# Patient Record
Sex: Male | Born: 1935 | Race: White | Hispanic: No | Marital: Married | Smoking: Former smoker
Health system: Southern US, Community
[De-identification: ages and names within clinical notes are randomized; demographics above are authoritative.]

## PROBLEM LIST (undated history)

## (undated) DIAGNOSIS — R251 Tremor, unspecified: Secondary | ICD-10-CM

## (undated) DIAGNOSIS — N400 Enlarged prostate without lower urinary tract symptoms: Secondary | ICD-10-CM

## (undated) DIAGNOSIS — K219 Gastro-esophageal reflux disease without esophagitis: Secondary | ICD-10-CM

## (undated) DIAGNOSIS — D499 Neoplasm of unspecified behavior of unspecified site: Secondary | ICD-10-CM

## (undated) DIAGNOSIS — Z95820 Peripheral vascular angioplasty status with implants and grafts: Secondary | ICD-10-CM

## (undated) DIAGNOSIS — J383 Other diseases of vocal cords: Secondary | ICD-10-CM

## (undated) DIAGNOSIS — R0989 Other specified symptoms and signs involving the circulatory and respiratory systems: Secondary | ICD-10-CM

## (undated) DIAGNOSIS — Z8679 Personal history of other diseases of the circulatory system: Secondary | ICD-10-CM

## (undated) DIAGNOSIS — I70219 Atherosclerosis of native arteries of extremities with intermittent claudication, unspecified extremity: Secondary | ICD-10-CM

## (undated) DIAGNOSIS — I7090 Unspecified atherosclerosis: Secondary | ICD-10-CM

## (undated) DIAGNOSIS — E785 Hyperlipidemia, unspecified: Secondary | ICD-10-CM

## (undated) DIAGNOSIS — E039 Hypothyroidism, unspecified: Secondary | ICD-10-CM

## (undated) DIAGNOSIS — IMO0002 Reserved for concepts with insufficient information to code with codable children: Secondary | ICD-10-CM

## (undated) DIAGNOSIS — J449 Chronic obstructive pulmonary disease, unspecified: Secondary | ICD-10-CM

## (undated) DIAGNOSIS — G4730 Sleep apnea, unspecified: Secondary | ICD-10-CM

## (undated) DIAGNOSIS — C801 Malignant (primary) neoplasm, unspecified: Secondary | ICD-10-CM

## (undated) DIAGNOSIS — E079 Disorder of thyroid, unspecified: Secondary | ICD-10-CM

## (undated) DIAGNOSIS — M5126 Other intervertebral disc displacement, lumbar region: Secondary | ICD-10-CM

## (undated) DIAGNOSIS — IMO0001 Reserved for inherently not codable concepts without codable children: Secondary | ICD-10-CM

## (undated) DIAGNOSIS — N189 Chronic kidney disease, unspecified: Secondary | ICD-10-CM

## (undated) DIAGNOSIS — M47817 Spondylosis without myelopathy or radiculopathy, lumbosacral region: Secondary | ICD-10-CM

## (undated) DIAGNOSIS — R490 Dysphonia: Secondary | ICD-10-CM

## (undated) DIAGNOSIS — M5136 Other intervertebral disc degeneration, lumbar region: Secondary | ICD-10-CM

## (undated) DIAGNOSIS — M51369 Other intervertebral disc degeneration, lumbar region without mention of lumbar back pain or lower extremity pain: Secondary | ICD-10-CM

## (undated) DIAGNOSIS — M1990 Unspecified osteoarthritis, unspecified site: Secondary | ICD-10-CM

## (undated) HISTORY — DX: Unspecified osteoarthritis, unspecified site: M19.90

## (undated) HISTORY — PX: THYROID SURGERY: SHX805

## (undated) HISTORY — PX: TONSILLECTOMY: SUR1361

## (undated) HISTORY — PX: MOUTH SURGERY: SHX715

## (undated) HISTORY — DX: Disorder of thyroid, unspecified: E07.9

## (undated) HISTORY — PX: APPENDECTOMY: SHX54

## (undated) HISTORY — PX: COLONOSCOPY W/ POLYPECTOMY: SHX1380

---

## 2014-03-17 DIAGNOSIS — I739 Peripheral vascular disease, unspecified: Secondary | ICD-10-CM | POA: Insufficient documentation

## 2014-03-17 DIAGNOSIS — E7800 Pure hypercholesterolemia, unspecified: Secondary | ICD-10-CM | POA: Insufficient documentation

## 2014-03-17 DIAGNOSIS — E785 Hyperlipidemia, unspecified: Secondary | ICD-10-CM | POA: Insufficient documentation

## 2014-03-17 DIAGNOSIS — E890 Postprocedural hypothyroidism: Secondary | ICD-10-CM | POA: Insufficient documentation

## 2014-03-17 DIAGNOSIS — N182 Chronic kidney disease, stage 2 (mild): Secondary | ICD-10-CM | POA: Insufficient documentation

## 2014-03-17 DIAGNOSIS — M5136 Other intervertebral disc degeneration, lumbar region: Secondary | ICD-10-CM | POA: Insufficient documentation

## 2014-03-17 DIAGNOSIS — R490 Dysphonia: Secondary | ICD-10-CM | POA: Insufficient documentation

## 2014-03-17 DIAGNOSIS — R251 Tremor, unspecified: Secondary | ICD-10-CM | POA: Insufficient documentation

## 2014-03-17 DIAGNOSIS — K219 Gastro-esophageal reflux disease without esophagitis: Secondary | ICD-10-CM | POA: Insufficient documentation

## 2014-03-23 DIAGNOSIS — N401 Enlarged prostate with lower urinary tract symptoms: Secondary | ICD-10-CM

## 2014-03-23 DIAGNOSIS — N138 Other obstructive and reflux uropathy: Secondary | ICD-10-CM | POA: Insufficient documentation

## 2014-03-26 DIAGNOSIS — Z85850 Personal history of malignant neoplasm of thyroid: Secondary | ICD-10-CM | POA: Insufficient documentation

## 2014-05-19 ENCOUNTER — Ambulatory Visit (INDEPENDENT_AMBULATORY_CARE_PROVIDER_SITE_OTHER): Payer: BC Managed Care – PPO

## 2014-05-19 ENCOUNTER — Encounter: Payer: Self-pay | Admitting: Podiatrist

## 2014-05-19 ENCOUNTER — Ambulatory Visit (INDEPENDENT_AMBULATORY_CARE_PROVIDER_SITE_OTHER): Payer: BC Managed Care – PPO | Admitting: Podiatrist

## 2014-05-19 DIAGNOSIS — R52 Pain, unspecified: Secondary | ICD-10-CM

## 2014-05-19 DIAGNOSIS — M722 Plantar fascial fibromatosis: Secondary | ICD-10-CM

## 2014-05-19 NOTE — Patient Instructions (Signed)

## 2014-05-19 NOTE — Progress Notes (Signed)
   Subjective:    Patient ID: Victor [name], male    DOB: 1936/06/09, 78 y.o.   MRN: [id number]  HPI PT STATED RT FOOT HEEL IS BEEN HURTING FOR 3 MONTHS. THE FOOT IS GETTING WORSE. THE FOOT GET AGGRAVATED FIRST THING IN THE MORNING. TRIED ICE, STETCHING AND IT HELP SOME.    Review of Systems  HENT:       RINGING EARS  Eyes: Positive for visual disturbance.  Musculoskeletal: Positive for back pain and joint swelling.  Neurological: Positive for tremors and light-headedness.  Hematological: Bruises/bleeds easily.  All other systems reviewed and are negative.      Objective:   Physical Exam Patient is awake, alert, and oriented x 3.  In no acute distress.  Vascular status is intact with palpable pedal pulses at 2/4 DP and PT bilateral and capillary refill time within normal limits. Neurological sensation is also intact bilaterally via Semmes Weinstein monofilament at 5/5 sites. Light touch, vibratory sensation, Achilles tendon reflex is intact. Dermatological exam reveals  A verucca present on the plantar aspect of the right foot.  Left hallux has a mycotic toenail present.  otherwise skin color, turger and texture as normal. No open lesions present.  Musculature intact with dorsiflexion, plantarflexion, inversion, eversion. Pain on palpation present on the right foot at the insertion of the plantar fascia on the plantar medial aspect of the foot.      Assessment & Plan:  Plantar fasciitis right, plantar verucca, mycotic nail  Recommended treatment options.  Discussed oral, topical, laser treatment options for the nail.  Discussed plantar fasciiitis and treatment options.  Stretches, plantar fascial stretches, shoe changes and inserts were recommended.  Plantar fascial injection was also performed under sterile technique.

## 2014-11-12 DIAGNOSIS — C801 Malignant (primary) neoplasm, unspecified: Secondary | ICD-10-CM

## 2014-11-12 HISTORY — DX: Malignant (primary) neoplasm, unspecified: C80.1

## 2014-11-12 HISTORY — PX: OTHER SURGICAL HISTORY: SHX169

## 2014-12-22 ENCOUNTER — Ambulatory Visit: Payer: Self-pay | Admitting: Podiatrist

## 2015-02-24 ENCOUNTER — Telehealth: Payer: Self-pay | Admitting: Radiation Oncology

## 2015-02-24 NOTE — Telephone Encounter (Signed)
Mr. Serrette wanted to know how to find radiation oncology. 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

## 2015-02-25 ENCOUNTER — Telehealth: Payer: Self-pay | Admitting: *Deleted

## 2015-02-25 NOTE — Telephone Encounter (Signed)
Placed introductory call to new referral patient. 1. Introduced myself as the oncology nurse navigator that works with Dr. Isidore Moos to whom he has been referred by Dr. Redmond Baseman and with whom he has an appt next Wed. 2. I briefly explained my role as his navigator, I indicated that I would be joining him during his appt next week. 3. I answered his questions re: RT, including likely SEs. 4. I confirmed his understanding of the [facility] location, explained Pulcifer service, arrival and RadOnc registration process.  5. I provided my contact information, encouraged him to call if he has questions/concerns before next week. 6. He verbalized understanding of information provided, expressed appreciation for my call.  Gayleen Orem, RN, BSN, [place] at [place] [phone]

## 2015-03-01 ENCOUNTER — Encounter: Payer: Self-pay | Admitting: Radiation Oncology

## 2015-03-01 NOTE — Progress Notes (Addendum)
Head and Neck Cancer Location of Tumor / Histology: Vocal Cord   Patient presented  months ago with symptoms of: Hoarseness, dysphonia  ,dysplasia Biopsies of  (if applicable) revealed: 0000000: Vocal Cord ,biopsy,left anterior=Squamous cell Carcinoma in Situ, No stromal invasion identifie  Nutrition Status Yes No Comments  Weight changes? []  [x]    Swallowing concerns? []  [x]    PEG? []  [x]     Referrals Yes No Comments  Social Work? []  [x]    Dentistry? [x]  []  03/01/15 Dr. Laurelyn Sickle   Swallowing therapy? []  []    Nutrition? []  [x]    Med/Onc? []  [x]     Safety Issues Yes No Comments  Prior radiation? []  [x]    Pacemaker/ICD? []  [x]    Possible current pregnancy? []  [x]    Is the patient on methotrexate? []  [x]     Tobacco/Marijuana/Snuff/ETOH [id number] smoker cigarettes, social alcohol use,  Past/Anticipated interventions by otolaryngology, if any: Dr. David Bates,MD4/14/16    Past/Anticipated interventions by medical oncology, if any: none     Current Complaints / other details: Married,  2 son and daughter,  Chronic kidney disease stage IIII,  Likely 2ndary to NSAID use  Followed by nephrology ,occasional tremors , Hx thyroid malignancy, stents b/l groin last year legs were going numb,   Father cancer colon, deceased, sister started with breast cancer, mets to bone,lung, deceased,  Allergies:NKDA No nausea, no swallowing difficulty, no pain, no fatigue Wt Readings from Last 3 Encounters:  No data found for Wt   BP 128/65 mmHg  Pulse 58  Temp(Src) 97.6 F (36.4 C) (Oral)  Resp 20  Ht 5\' 10"  (1.778 m)  Wt 212 lb 4.8 oz (96.299 kg)  BMI 30.46 kg/m2  SpO2 97%

## 2015-03-02 ENCOUNTER — Ambulatory Visit
Admission: RE | Admit: 2015-03-02 | Discharge: 2015-03-02 | Disposition: A | Discharge status: Home / Self Care | Payer: Medicare Other | Source: Ambulatory Visit | Attending: Radiation Oncology | Admitting: Radiation Oncology

## 2015-03-02 ENCOUNTER — Encounter: Payer: Self-pay | Admitting: *Deleted

## 2015-03-02 ENCOUNTER — Encounter: Payer: Self-pay | Admitting: Radiation Oncology

## 2015-03-02 DIAGNOSIS — L599 Disorder of the skin and subcutaneous tissue related to radiation, unspecified: Secondary | ICD-10-CM | POA: Insufficient documentation

## 2015-03-02 DIAGNOSIS — D020 Carcinoma in situ of larynx: Secondary | ICD-10-CM

## 2015-03-02 DIAGNOSIS — Z931 Gastrostomy status: Secondary | ICD-10-CM | POA: Diagnosis not present

## 2015-03-02 DIAGNOSIS — Z510 Encounter for antineoplastic radiation therapy: Secondary | ICD-10-CM | POA: Insufficient documentation

## 2015-03-02 DIAGNOSIS — R1310 Dysphagia, unspecified: Secondary | ICD-10-CM | POA: Diagnosis not present

## 2015-03-02 HISTORY — DX: Other intervertebral disc displacement, lumbar region: M51.26

## 2015-03-02 HISTORY — DX: Hyperlipidemia, unspecified: E78.5

## 2015-03-02 HISTORY — DX: Tremor, unspecified: R25.1

## 2015-03-02 HISTORY — DX: Dysphonia: R49.0

## 2015-03-02 HISTORY — DX: Unspecified atherosclerosis: I70.90

## 2015-03-02 HISTORY — DX: Chronic kidney disease, unspecified: N18.9

## 2015-03-02 HISTORY — DX: Atherosclerosis of native arteries of extremities with intermittent claudication, unspecified extremity: I70.219

## 2015-03-02 HISTORY — DX: Malignant (primary) neoplasm, unspecified: C80.1

## 2015-03-02 HISTORY — DX: Gastro-esophageal reflux disease without esophagitis: K21.9

## 2015-03-02 HISTORY — DX: Other intervertebral disc degeneration, lumbar region without mention of lumbar back pain or lower extremity pain: M51.369

## 2015-03-02 HISTORY — DX: Other intervertebral disc degeneration, lumbar region: M51.36

## 2015-03-02 HISTORY — DX: Benign prostatic hyperplasia without lower urinary tract symptoms: N40.0

## 2015-03-02 HISTORY — DX: Other specified symptoms and signs involving the circulatory and respiratory systems: R09.89

## 2015-03-02 HISTORY — DX: Personal history of other diseases of the circulatory system: Z86.79

## 2015-03-02 HISTORY — DX: Spondylosis without myelopathy or radiculopathy, lumbosacral region: M47.817

## 2015-03-02 HISTORY — DX: Other diseases of vocal cords: J38.3

## 2015-03-02 HISTORY — DX: Peripheral vascular angioplasty status with implants and grafts: Z95.820

## 2015-03-02 NOTE — Progress Notes (Signed)
Please see the Nurse Progress Note in the MD Initial Consult Encounter for this patient. 

## 2015-03-02 NOTE — Progress Notes (Addendum)
Radiation Oncology         [phone] ________________________________  Initial outpatient Consultation  Name: Victor [name] MRN: [id number]  Date: 03/02/2015  DOB: 1936-05-22  CC:No primary care provider on file.  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 male who presented with several months h/o dysphonia.  Per Dr Redmond Baseman' notes, original biopsy of anterior commissure lesion in Nov 2015 revealed high grade dysplasia.  Tissue from CO2 laser excision of the left anterior vocal cord on 11/12/2014 showed squamous cell carcinoma in situ, no stromal invasion identified.  Patient has been discussed w/ Dr Redmond Baseman.  Further Surgical procedure not recommended due to concerns re: ability to clear all disease and also potential morbidity to patient's voice.  Recent laryngoscopy this month was done by Dr Redmond Baseman. Patient shared photos from January's laryngoscopy / procedure.            The pt feels well and has no complaints (besides dysphonia) at this time. Hx of smoking. 1.5 packs a day for 50 years. Quit 10 yrs ago.   PREVIOUS RADIATION THERAPY: No  PAST MEDICAL HISTORY:  has a past medical history of Thyroid disease; Arthritis; Cancer (11/12/14); BPH (benign prostatic hyperplasia); GERD (gastroesophageal reflux disease); Atherosclerosis; H/O carotid atherosclerosis; Carotid bruit; Bulging lumbar disc; Chronic kidney disease; Dysphonia; Hyperlipidemia; Spondylosis of lumbosacral region; Occasional tremors; DDD (degenerative disc disease), lumbar; Atherosclerotic PVD with intermittent claudication; Peripheral vascular angioplasty status with implants and grafts; and Dysplasia of true vocal cord.  Thyroidectomy.  PAST SURGICAL HISTORY: Past Surgical History    Procedure Laterality Date  . Vocal cord biopsy  11/12/14    squamous cell carcinoma in situ  . Appendectomy    . Tonsillectomy    . Mouth surgery      tooth extraction  . Thyroid surgery      FAMILY HISTORY: family history is not on file. Father cancer colon, deceased. Sister started with breast cancer, metastasized to bone and lungs, deceased.  SOCIAL HISTORY:  reports that he has never smoked. He does not have any smokeless tobacco history on file. He reports that he does not drink alcohol or use illicit drugs. Hx of smoking. 1.5 packs a day for 50 years. Quit 10 yrs ago.  ALLERGIES: Review of patient's allergies indicates no known allergies.  MEDICATIONS:  Current Outpatient Prescriptions  Medication Sig Dispense Refill  . aspirin 81 MG tablet Take 81 mg by mouth daily.    . diclofenac sodium (VOLTAREN) 1 % GEL Apply 2 g topically as needed.     . Doxazosin Mesylate (CARDURA PO) Take 2 mg by mouth daily.     Marland Kitchen FA-B6-B12-D-Omega 3-Phytoster (ANIMI-3/VITAMIN D PO) Take 1 tablet by mouth daily. d3    . levothyroxine (SYNTHROID, LEVOTHROID) 150 MCG tablet Take 150 mcg by mouth daily before breakfast.    . Liniments (SALONPAS EX) Apply topically as needed. patches    . Loratadine (CLARITIN PO) Take 10 mg by mouth as needed.     . Melatonin 3 MG TABS Take 3 mg by mouth at bedtime as needed.     Marland Kitchen omeprazole (PRILOSEC) 20 MG capsule Take 20 mg by mouth daily.    . propranolol (INDERAL) 80 MG tablet Take 80  mg by mouth 3 (three) times daily.    . simvastatin (ZOCOR) 40 MG tablet Take 40 mg by mouth daily.    . traMADol (ULTRAM) 50 MG tablet Take by mouth every 6 (six) hours as needed.    . clopidogrel (PLAVIX) 75 MG tablet Take 75 mg by mouth daily.    . Coenzyme Q10 (COQ10 PO) Take by mouth.    Marland Kitchen HYDROcodone-acetaminophen (NORCO/VICODIN) 5-325 MG per tablet Take 1 tablet by mouth every 6 (six) hours as needed for moderate pain.     No current facility-administered medications for this  encounter.    REVIEW OF SYSTEMS:  Notable for that above.   PHYSICAL EXAM:  height is 5\' 10"  (1.778 m) and weight is 212 lb 4.8 oz (96.299 kg). His oral temperature is 97.6 F (36.4 C). His blood pressure is 128/65 and his pulse is 58. His respiration is 20 and oxygen saturation is 97%.   General: Alert and oriented, in no acute distress HEENT: Head is normocephalic. Extraocular movements are intact. Oropharynx is clear. No palpable cervical or supraclavicular lymphadenopathy. Neck: Neck is supple, no palpable cervical or supraclavicular lymphadenopathy. Heart: Regular in rate and rhythm with no murmurs, rubs, or gallops. Chest: Clear to auscultation bilaterally, with no rhonchi, wheezes, or rales. Lungs are clear. Abdomen: Soft, nontender, nondistended, with no rigidity or guarding. Normal active bowel sounds. Extremities: No cyanosis or edema. Lymphatics: see Neck Exam Skin: No concerning lesions. Musculoskeletal: symmetric strength and muscle tone throughout. Neurologic: Cranial nerves II through XII are grossly intact. No obvious focalities. Speech is fluent. Coordination is intact. Psychiatric: Judgment and insight are intact. Affect is appropriate.  .  ECOG = 0  0 - Asymptomatic (Fully active, able to carry on all predisease activities without restriction)  1 - Symptomatic but completely ambulatory (Restricted in physically strenuous activity but ambulatory and able to carry out work of a light or sedentary nature. For example, light housework, office work)  2 - Symptomatic, <50% in bed during the day (Ambulatory and capable of all self care but unable to carry out any work activities. Up and about more than 50% of waking hours)  3 - Symptomatic, >50% in bed, but not bedbound (Capable of only limited self-care, confined to bed or chair 50% or more of waking hours)  4 - Bedbound (Completely disabled. Cannot carry on any self-care. Totally confined to bed or chair)  5 -  Death   [name], Creech RH, Tormey DC, et al. [phone]). "Toxicity and response criteria of the [facility]". [place] Oncol. 5 (6): 649-55   LABORATORY DATA:  No results found for: WBC, HGB, HCT, MCV, PLT CMP  No results found for: NA, K, CL, CO2, GLUCOSE, BUN, CREATININE, CALCIUM, PROT, ALBUMIN, AST, ALT, ALKPHOS, BILITOT, GFRNONAA, GFRAA       RADIOGRAPHY: No results found.    IMPRESSION/PLAN: The pt is a good candidate for radiotherapy. This may provide better voice preservation than surgical /laser procedure.  Discussed the benefits and side effects of radiotherapy with with pt. Had pictures of the pt's larynx (provided by the pt) entered into the electronic medical record (Epic). Schedule the pt to meet with a nutritionist to educate him with the importance of nutrition with recovery. Scheduled a CT sim and radiation planning on 03/08/15 at [place] to start radiation within that week to fit the pt's schedule. Consent signed today.  Discussed risks benefits and side effects of therapy. He is enthusiastic to  proceed.  Baseline Hypothyroidism to continue to be managed by PCP.   This document serves as a record of services personally performed by Eppie Gibson, MD. It was created on her behalf by Darcus Austin, a trained medical scribe. The creation of this record is based on the scribe's personal observations and the provider's statements to them. This document has been checked and approved by the attending provider.     __________________________________________   Eppie Gibson, MD

## 2015-03-04 NOTE — Progress Notes (Signed)
Met with patient during initial consult with Dr. Isidore Moos.  His was accompanied by his wife. 1. I further introduced myself as their Navigator, explained my role as a member of the Care Team, provided contact information, encouraged them to contact me with questions/concerns as treatments/procedures begin. 2. Provided New Patient Information packet:  Contact information for physician and navigator  Advance Directive information ([place] blue pamphlet)  Fall Prevention Patient Safety Plan  Appointment Guideline  [facility] campus map with highlight of [place] 3. Provided introductory explanation of radiation treatment including SIM planning and fitting of head mask, showed them example of mask.   4. Took pictures of January's laryngoscopy for later Epic documentation. 5. Provided a tour of SIM and Tomo areas, explained treatment and arrival procedures.   They verbalized understanding of information provided.    Gayleen Orem, RN, BSN, [place] at [place] [phone]

## 2015-03-04 NOTE — Addendum Note (Signed)
Encounter addended by: Leota Sauers, RN on: 03/04/2015 12:01 PM<BR>     Documentation filed: Notes Section

## 2015-03-08 ENCOUNTER — Encounter: Payer: Self-pay | Admitting: *Deleted

## 2015-03-08 ENCOUNTER — Ambulatory Visit
Admission: RE | Admit: 2015-03-08 | Discharge: 2015-03-08 | Disposition: A | Discharge status: Home / Self Care | Payer: Medicare Other | Source: Ambulatory Visit | Attending: Radiation Oncology | Admitting: Radiation Oncology

## 2015-03-08 DIAGNOSIS — Z510 Encounter for antineoplastic radiation therapy: Secondary | ICD-10-CM | POA: Diagnosis not present

## 2015-03-08 DIAGNOSIS — D020 Carcinoma in situ of larynx: Secondary | ICD-10-CM

## 2015-03-08 NOTE — Progress Notes (Signed)
  Radiation Oncology         [phone] ________________________________  Name: Victor [name] MRN: [id number]  Date: 03/08/2015  DOB: [birth year]  SIMULATION AND TREATMENT PLANNING NOTE  Outpatient  DIAGNOSIS:     ICD-9-CM ICD-10-CM   1. Carcinoma in situ of vocal cord 231.0 D02.0     NARRATIVE:  The patient was brought to the [place].  Identity was confirmed.  All relevant records and images related to the planned course of therapy were reviewed.  The patient freely provided informed written consent to proceed with treatment after reviewing the details related to the planned course of therapy. The consent form was witnessed and verified by the simulation staff.    Then, the patient was set-up in a stable reproducible  supine position for radiation therapy.  Aquaplast mask was custom made. CT images were obtained.  Surface markings were placed.  The CT images were loaded into the planning software.    TREATMENT PLANNING NOTE: Treatment planning then occurred.  The radiation prescription was entered and confirmed.    A total of 3 medically necessary complex treatment devices were fabricated and supervised by me -- aquaplast mask and 2 wedges for dose homogeneity. I have requested : 3D Simulation  I have requested a DVH of the following structures: cord, esophagus, ctv.  The patient will receive 63 Gy in 28 fractions.   -----------------------------------  Eppie Gibson, MD

## 2015-03-08 NOTE — Progress Notes (Signed)
To provide support and encouragement, care continuity and to assess for needs, met with patient during his CT SIM.  His wife accompanied him.  After SIM, showed them tmt machine, explained registration, arrival and preparation procedures.  Provided them an Epic appointment calendar to supplement the [facility] schedule.  They understand I can be contacted with questions/concerns.  Gayleen Orem, RN, BSN, [place] at [place] [phone]

## 2015-03-09 ENCOUNTER — Encounter: Payer: Medicare Other | Admitting: Nutrition

## 2015-03-09 DIAGNOSIS — Z510 Encounter for antineoplastic radiation therapy: Secondary | ICD-10-CM | POA: Diagnosis not present

## 2015-03-10 ENCOUNTER — Ambulatory Visit: Payer: Medicare Other | Admitting: Nutrition

## 2015-03-10 ENCOUNTER — Ambulatory Visit
Admission: RE | Admit: 2015-03-10 | Discharge: 2015-03-10 | Disposition: A | Discharge status: Home / Self Care | Payer: Medicare Other | Source: Ambulatory Visit | Attending: Radiation Oncology | Admitting: Radiation Oncology

## 2015-03-10 DIAGNOSIS — Z510 Encounter for antineoplastic radiation therapy: Secondary | ICD-10-CM | POA: Diagnosis not present

## 2015-03-10 NOTE — Progress Notes (Signed)
79 year old male diagnosed with cancer of the vocal cord.  He is a patient of Dr. Isidore Moos.  Past medical history includes tobacco, thyroid disease, GERD, chronic kidney disease, PVD, and hyperlipidemia.  Medications include co-Q10, vitamin B6, B12, omega-3 fatty acid, Synthroid, Prilosec, and Zocor.  Labs were reviewed.  Height: 5 feet 10 inches. Weight: 212.3 pounds. Usual body weight: 205 pounds. BMI: 30.46.  Patient is to receive approximately 6 weeks of radiation therapy. He currently denies nutrition impact symptoms. Verbalizes desire to achieve weight loss to goal weight of 200 pounds. Patient states he is relatively active, walking 4-5 miles daily. He eats a variety of foods.  Nutrition diagnosis:  Predicted suboptimal energy intake related to diagnosis of vocal cord cancer as evidenced by condition for which research shows an increased incidence of suboptimal energy intake.  Intervention:  Patient educated to consume high-calorie, high-protein foods to promote weight maintenance. Reviewed importance of soft moist foods when swallowing difficulty occurs. Educated patient on strategies for poor appetite. Provided fact sheets.  Questions were answered.  Contact information provided.  Teach back method was used.  Monitoring, evaluation, goals: Patient will tolerate adequate calories and protein to minimize weight loss.  Next visit: Monday, May 16 after radiation therapy.  **Disclaimer: This note was dictated with voice recognition software. Similar sounding words can inadvertently be transcribed and this note may contain transcription errors which may not have been corrected upon publication of note.**

## 2015-03-11 ENCOUNTER — Ambulatory Visit
Admission: RE | Admit: 2015-03-11 | Discharge: 2015-03-11 | Disposition: A | Discharge status: Home / Self Care | Payer: Medicare Other | Source: Ambulatory Visit | Attending: Radiation Oncology | Admitting: Radiation Oncology

## 2015-03-11 ENCOUNTER — Encounter: Payer: Self-pay | Admitting: *Deleted

## 2015-03-11 DIAGNOSIS — Z510 Encounter for antineoplastic radiation therapy: Secondary | ICD-10-CM | POA: Diagnosis not present

## 2015-03-11 NOTE — Progress Notes (Signed)
To provide support and encouragement, care continuity and to assess for needs, met with patient during his 2nd RT.  His wife accompanied him. 1. He tolerated treatment without incident. 2. I answered his questions re: onset of SEs. 3. I explained he will meet with nursing next week for post-sim education. 4. I provided an Epic calendar of future appts. 5. He understands he can contact me with needs/concerns.  Gayleen Orem, RN, BSN, [place] at [place] [phone]

## 2015-03-14 ENCOUNTER — Telehealth: Payer: Self-pay | Admitting: *Deleted

## 2015-03-14 ENCOUNTER — Ambulatory Visit
Admission: RE | Admit: 2015-03-14 | Discharge: 2015-03-14 | Disposition: A | Discharge status: Home / Self Care | Payer: Medicare Other | Source: Ambulatory Visit | Attending: Radiation Oncology | Admitting: Radiation Oncology

## 2015-03-14 VITALS — BP 125/56 | HR 64 | Temp 97.3°F | Resp 12 | Wt 213.2 lb

## 2015-03-14 DIAGNOSIS — D020 Carcinoma in situ of larynx: Secondary | ICD-10-CM

## 2015-03-14 DIAGNOSIS — Z510 Encounter for antineoplastic radiation therapy: Secondary | ICD-10-CM | POA: Diagnosis not present

## 2015-03-14 MED ORDER — LIDOCAINE VISCOUS 2 % MT SOLN: OROMUCOSAL | Status: DC

## 2015-03-14 MED ORDER — SUCRALFATE 1 G PO TABS: ORAL_TABLET | ORAL | Status: DC

## 2015-03-14 NOTE — Progress Notes (Signed)
   Weekly Management Note:  Outpatient    ICD-9-CM ICD-10-CM   1. Carcinoma in situ of vocal cord 231.0 D02.0     Current Dose:  6.75 Gy  Projected Dose: 63 Gy   Narrative:  The patient presents for routine under treatment assessment.  CBCT/MVCT images/Port film x-rays were reviewed.  The chart was checked. A little more hoarse  Physical Findings:  weight is 213 lb 3.2 oz (96.707 kg). His oral temperature is 97.3 F (36.3 C). His blood pressure is 125/56 and his pulse is 64. His respiration is 12 and oxygen saturation is 97%.   Wt Readings from Last 3 Encounters:  03/14/15 213 lb 3.2 oz (96.707 kg)  03/02/15 212 lb 4.8 oz (96.299 kg)   NAD  Impression:  The patient is tolerating radiotherapy.  Plan:  Continue radiotherapy as planned. Will make Rx for odynophagia in case this develops.  Reviewed CBCT imaging w/ patient today.  ________________________________   Eppie Gibson, M.D.

## 2015-03-14 NOTE — Telephone Encounter (Signed)
Returned patient's Sunday PM VM, informed him of appt adjustment for his weekly UT that now corresponds with new Tomo appt.  Gayleen Orem, RN, BSN, [place] at [place] [phone]

## 2015-03-14 NOTE — Progress Notes (Signed)
He is currently in no pain.  Pt denies dysphagia. Pt reports a regular unmodified diet orally. Oral exam reveals pink, thin clear sputum. Skin exam reveals warm, dry and intact. Reports voice is hoarse in comparison to his norm.  Pt reports Constipation, a bowel movement every day. Reports bowel movement is firm.  Gave constipation handout.  Wt Readings from Last 3 Encounters:  03/14/15 213 lb 3.2 oz (96.707 kg)  03/02/15 212 lb 4.8 oz (96.299 kg)   BP 125/56 mmHg  Pulse 64  Temp(Src) 97.3 F (36.3 C) (Oral)  Resp 12  Wt 213 lb 3.2 oz (96.707 kg)  SpO2 97%

## 2015-03-14 NOTE — Progress Notes (Signed)
Constipation Management   Constipation is being unable to move your bowels, having less frequent bowel movements, or having to push harder to move your bowels. In addition, certain medications (i.e. pain medications), eating or drinking less and being less active are possible causes. Keeping your bowel movements regular and easy to pass is important. A daily bowel regimen helps to prevent this potentially troublesome side effect.    What can you do about it?  Diet:  Most adults should consume a diet of 25-35 grams of fiber per day.   Foods high in fiber include:  wheat bran, whole-grain breads/cereals, fruits and vegetables (raw and uncooked with skins and peels on), popcorn and dried beans.   *If you have no appetite or problems chewing or swallowing, have ever been told that you need a low-fiber diet, low-residue diet, these food may not be recommended.    Fluid: It is important for you to stay hydrated.  You can accomplish this by drinking eight, 8oz glasses of fluids a day.  Examples of fluids are water, prune juice, popsicles, gelatin or ice cream.   *The benefits of diet and fluid intake may not be noted for several weeks, so it is important to not discontinue if you Nohlan't see immediate results.    Medications (if constipated):  . MIRALAX (polyethylene glycol):  17g powder (one capful) diluted in 8 fluid ounces water, juice, soda or coffee orally once a day and take one (1) Senokot-Sr (Senna-S):  at bedtime.  . If you do not have a bowel movement in the morning, take MIRALAX (polyethylene glycol):  17 g powder (one capful) diluted in 8 fluid ounces water, juice, soda or coffee orally once a day and take one (1) Senokot-Sr (Senna-S):  in the morning and bedtime.   . If you do not have bowel movement by the next morning then take one (1) Liquid Glycerin suppository.   *IF AFTER THIS PROTOCOL YOU STILL HAVEN'T HAD A BOWEL MOVEMENT, PLEASE CALL RADIATION NURSING AT [phone].  Once  you started having bowel movements, continue to take one (1) Senokot-Sr (Senna-S):  once a day as your daily laxative protocol.  If you start having diarrhea, take only one (1) Senokot-Sr (Senna-S): every other day.  Source: American Dietetic Association     

## 2015-03-14 NOTE — Progress Notes (Signed)
SKIN CARE DURING RADIATION TREATMENT-HEAD AND NECK  RECOMMENDATIONS: ? Use unscented soap (Dove) ? When showering it is fine for water to touch the area, but please avoid direct spray on the treatment field.  Also, wash inside and around the marked area ? When drying gently blot the area ? Avoid using lotions, oils or powders as well as products with alcohol ? If you shave, use an electric razor and DO NOT use pre or after shave lotion  ? Moisturizer o You may be given Radiaplex Gel or Biafine (provided by nursing) to use. Apply twice daily, once after treatment and then again prior to bedtime o Your Radiation Oncologist may suggest other skin care products ? PLEASE DO NOT APPLY ANYTHING TO THE TREATMENT AREA SKIN WITH 4 HOURS  PRIOR TO RADIATION  ? Mouth care o Soothing relief: rinse your mouth every 1-2 hours with a solution of  teaspoon baking soda and 1/8 teaspoon salt mixed in 1 cup of warm water o DO NOT use mouthwashes that contain alcohol, try using BIOTENE instead                       

## 2015-03-14 NOTE — Telephone Encounter (Signed)
Entry error

## 2015-03-15 ENCOUNTER — Ambulatory Visit
Admission: RE | Admit: 2015-03-15 | Discharge: 2015-03-15 | Disposition: A | Discharge status: Home / Self Care | Payer: Medicare Other | Source: Ambulatory Visit | Attending: Radiation Oncology | Admitting: Radiation Oncology

## 2015-03-15 DIAGNOSIS — Z510 Encounter for antineoplastic radiation therapy: Secondary | ICD-10-CM | POA: Diagnosis not present

## 2015-03-16 ENCOUNTER — Ambulatory Visit
Admission: RE | Admit: 2015-03-16 | Discharge: 2015-03-16 | Disposition: A | Discharge status: Home / Self Care | Payer: Medicare Other | Source: Ambulatory Visit | Attending: Radiation Oncology | Admitting: Radiation Oncology

## 2015-03-16 DIAGNOSIS — Z510 Encounter for antineoplastic radiation therapy: Secondary | ICD-10-CM | POA: Diagnosis not present

## 2015-03-17 ENCOUNTER — Ambulatory Visit
Admission: RE | Admit: 2015-03-17 | Discharge: 2015-03-17 | Disposition: A | Discharge status: Home / Self Care | Payer: Medicare Other | Source: Ambulatory Visit | Attending: Radiation Oncology | Admitting: Radiation Oncology

## 2015-03-17 DIAGNOSIS — Z510 Encounter for antineoplastic radiation therapy: Secondary | ICD-10-CM | POA: Diagnosis not present

## 2015-03-18 ENCOUNTER — Ambulatory Visit
Admission: RE | Admit: 2015-03-18 | Discharge: 2015-03-18 | Disposition: A | Discharge status: Home / Self Care | Payer: Medicare Other | Source: Ambulatory Visit | Attending: Radiation Oncology | Admitting: Radiation Oncology

## 2015-03-18 DIAGNOSIS — Z510 Encounter for antineoplastic radiation therapy: Secondary | ICD-10-CM | POA: Diagnosis not present

## 2015-03-21 ENCOUNTER — Ambulatory Visit
Admission: RE | Admit: 2015-03-21 | Discharge: 2015-03-21 | Disposition: A | Discharge status: Home / Self Care | Payer: Medicare Other | Source: Ambulatory Visit | Attending: Radiation Oncology | Admitting: Radiation Oncology

## 2015-03-21 ENCOUNTER — Encounter: Payer: Medicare Other | Admitting: Nutrition

## 2015-03-21 VITALS — BP 134/63 | HR 58 | Temp 98.3°F | Resp 12 | Wt 214.0 lb

## 2015-03-21 DIAGNOSIS — Z510 Encounter for antineoplastic radiation therapy: Secondary | ICD-10-CM | POA: Diagnosis not present

## 2015-03-21 DIAGNOSIS — D020 Carcinoma in situ of larynx: Secondary | ICD-10-CM

## 2015-03-21 NOTE — Progress Notes (Signed)
Weekly Management Note:  Site: Larynx/glottis Current Dose:  1800  cGy Projected Dose: 6300  cGy  Narrative: The patient is seen today for routine under treatment assessment. CBCT/MVCT images/port films were reviewed. The chart was reviewed.   He is without complaints today except for slight throat discomfort.  He uses viscous Xylocaine, and Carafate slurry.  Physical Examination:  Filed Vitals:   03/21/15 1445  BP: 134/63  Pulse: 58  Temp: 98.3 F (36.8 C)  Resp: 12  .  Weight: 214 lb (97.07 kg).  There are no significant skin changes along the neck.  Oral cavity and oropharynx unremarkable to inspection.  Indirect mirror examination not performed.  Impression: Tolerating radiation therapy well.  Plan: Continue radiation therapy as planned.

## 2015-03-21 NOTE — Progress Notes (Signed)
He rates his pain as a 4 on a scale of 0-10. constant, aching over neck   Pt denies dysphagia. Pt reports a soft regular diet. Oral exam reveals dry mouth. Recommended hard candy, sips of h20.  He states he hasn't been doing there jaw exercises but will start doing them. Pt reports clear and tenacious sputum. Skin exam reveals erythema. Continues to use Biafine as directed.   Pt reports a soft bowel movement every day.  Wt Readings from Last 3 Encounters:  03/14/15 213 lb 3.2 oz (96.707 kg)  03/02/15 212 lb 4.8 oz (96.299 kg)   BP 134/63 mmHg  Pulse 58  Temp(Src) 98.3 F (36.8 C) (Oral)  Resp 12  Wt 214 lb (97.07 kg)  SpO2 96%

## 2015-03-22 ENCOUNTER — Ambulatory Visit
Admission: RE | Admit: 2015-03-22 | Discharge: 2015-03-22 | Disposition: A | Discharge status: Home / Self Care | Payer: Medicare Other | Source: Ambulatory Visit | Attending: Radiation Oncology | Admitting: Radiation Oncology

## 2015-03-22 DIAGNOSIS — Z510 Encounter for antineoplastic radiation therapy: Secondary | ICD-10-CM | POA: Diagnosis not present

## 2015-03-23 ENCOUNTER — Ambulatory Visit
Admission: RE | Admit: 2015-03-23 | Discharge: 2015-03-23 | Disposition: A | Discharge status: Home / Self Care | Payer: Medicare Other | Source: Ambulatory Visit | Attending: Radiation Oncology | Admitting: Radiation Oncology

## 2015-03-23 DIAGNOSIS — Z510 Encounter for antineoplastic radiation therapy: Secondary | ICD-10-CM | POA: Diagnosis not present

## 2015-03-24 ENCOUNTER — Ambulatory Visit
Admission: RE | Admit: 2015-03-24 | Discharge: 2015-03-24 | Disposition: A | Discharge status: Home / Self Care | Payer: Medicare Other | Source: Ambulatory Visit | Attending: Radiation Oncology | Admitting: Radiation Oncology

## 2015-03-24 DIAGNOSIS — Z510 Encounter for antineoplastic radiation therapy: Secondary | ICD-10-CM | POA: Diagnosis not present

## 2015-03-25 ENCOUNTER — Ambulatory Visit
Admission: RE | Admit: 2015-03-25 | Discharge: 2015-03-25 | Disposition: A | Discharge status: Home / Self Care | Payer: Medicare Other | Source: Ambulatory Visit | Attending: Radiation Oncology | Admitting: Radiation Oncology

## 2015-03-25 ENCOUNTER — Encounter: Payer: Self-pay | Admitting: *Deleted

## 2015-03-25 DIAGNOSIS — Z510 Encounter for antineoplastic radiation therapy: Secondary | ICD-10-CM | POA: Diagnosis not present

## 2015-03-25 NOTE — Progress Notes (Signed)
Oncology Nurse Navigator Documentation  Oncology Nurse Navigator Flowsheets 03/25/2015  Navigator Encounter Type Treatment  Patient Visit Type Radonc  Treatment Phase Treatment - To provide support and encouragement, care continuity and to assess for needs, met with patient during RT.   He reported:  Tolerating tmts well.  Is having increased throat soreness, is taking Vicodin which provides relief. He denied any needs or concerns, understands he can contact me.   Barriers/Navigation Needs No barriers at this time  Time Spent with Patient [place], RN, BSN, [place] at [place] [phone]

## 2015-03-28 ENCOUNTER — Encounter: Payer: Self-pay | Admitting: Radiation Oncology

## 2015-03-28 ENCOUNTER — Ambulatory Visit
Admission: RE | Admit: 2015-03-28 | Discharge: 2015-03-28 | Disposition: A | Discharge status: Home / Self Care | Payer: Medicare Other | Source: Ambulatory Visit | Attending: Radiation Oncology | Admitting: Radiation Oncology

## 2015-03-28 ENCOUNTER — Encounter: Payer: Self-pay | Admitting: *Deleted

## 2015-03-28 VITALS — BP 119/54 | HR 76 | Temp 98.8°F | Resp 16 | Ht 70.0 in | Wt 209.1 lb

## 2015-03-28 DIAGNOSIS — R1310 Dysphagia, unspecified: Secondary | ICD-10-CM | POA: Diagnosis not present

## 2015-03-28 DIAGNOSIS — L599 Disorder of the skin and subcutaneous tissue related to radiation, unspecified: Secondary | ICD-10-CM | POA: Diagnosis not present

## 2015-03-28 DIAGNOSIS — D020 Carcinoma in situ of larynx: Secondary | ICD-10-CM

## 2015-03-28 DIAGNOSIS — Z79891 Long term (current) use of opiate analgesic: Secondary | ICD-10-CM | POA: Diagnosis not present

## 2015-03-28 DIAGNOSIS — Z510 Encounter for antineoplastic radiation therapy: Secondary | ICD-10-CM | POA: Diagnosis not present

## 2015-03-28 MED ORDER — FENTANYL 50 MCG/HR TD PT72: 50.0000 ug | MEDICATED_PATCH | TRANSDERMAL | Status: DC

## 2015-03-28 MED ORDER — BIAFINE EX EMUL
Freq: Once | CUTANEOUS | Status: AC
  Administered 2015-03-28: 15:00:00 via TOPICAL

## 2015-03-28 MED ORDER — HYDROCODONE-ACETAMINOPHEN 5-325 MG PO TABS: 1.0000 | ORAL_TABLET | ORAL | Status: DC | PRN

## 2015-03-28 MED ORDER — SENNA 8.6 MG PO TABS: ORAL_TABLET | ORAL | Status: DC

## 2015-03-28 NOTE — Progress Notes (Signed)
   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  CBCT/MVCT images/Port film x-rays were reviewed.  The chart was checked.   He reports pain at a 8/10 with swallowing.  He said it started yesterday and is worse today.  He is taking 3 Vicodin per day and would like a refill.  He originally had it from his biopsy.  He tried the lidocaine but it makes him sick to his stomach.  He has been using Carafate but says it is not helping.  He has not been able to eat very much, only applesauce and 1/2 a banana today.  He is also drinking 1 premiere protein shake per day. He reports a dry mouth and his been rinsing his mouth with baking soda, salt and warm water. He has lost 5 lbs since 03/21/15.  Orthostatic vitals taken: bp sitting: 125/65, hr 69, bp standing:  119/54, hr 76 He reports fatigue and is not sleeping.  The skin on his neck is pink.  He is using biafine and would like a refill.  Another tube has been given.  Physical Findings:  height is 5\' 10"  (1.778 m) and weight is 209 lb 1.6 oz (94.847 kg). His oral temperature is 98.8 F (37.1 C). His blood pressure is 119/54 and his pulse is 76. His respiration is 16 and oxygen saturation is 96%.   Wt Readings from Last 3 Encounters:  03/14/15 213 lb 3.2 oz (96.707 kg)  03/02/15 212 lb 4.8 oz (96.299 kg)   NAD, skin intact  Impression:  The patient is tolerating radiotherapy.  Plan:  Continue radiotherapy as planned.  See Rx above for symptom/pain control. Discussed importance of PO intake - otherwise PEG would be needed. Check again on Friday. Refer to SLP for dysphagia. ________________________________   Eppie Gibson, M.D.

## 2015-03-28 NOTE — Progress Notes (Addendum)
Victor [name] has completed 13 fractions to his larynx.  He reports pain at a 8/10 with swallowing.  He said it started yesterday and is worse today.  He is taking 3 Vicodin per day and would like a refill.  He originally had it from his biopsy.  He tried the lidocaine but it makes him sick to his stomach.  He has been using Carafate but says it is not helping.  He has not been able to eat very much, only applesauce and 1/2 a banana today.  He is also drinking 1 premiere protein shake per day. He reports a dry mouth and his been rinsing his mouth with baking soda, salt and warm water. He has lost 5 lbs since 03/21/15.  Orthostatic vitals taken: bp sitting: 125/65, hr 69, bp standing:  119/54, hr 76 He reports fatigue and is not sleeping.  The skin on his neck is pink.  He is using biafine and would like a refill.  Another tube has been given.  BP 119/54 mmHg  Pulse 76  Temp(Src) 98.8 F (37.1 C) (Oral)  Resp 16  Ht 5\' 10"  (1.778 m)  Wt 209 lb 1.6 oz (94.847 kg)  BMI 30.00 kg/m2  SpO2 96%   Wt Readings from Last 3 Encounters:  03/14/15 213 lb 3.2 oz (96.707 kg)  03/02/15 212 lb 4.8 oz (96.299 kg)

## 2015-03-29 ENCOUNTER — Ambulatory Visit: Payer: Medicare Other | Admitting: Nutrition

## 2015-03-29 ENCOUNTER — Other Ambulatory Visit: Payer: Self-pay | Admitting: Radiation Oncology

## 2015-03-29 ENCOUNTER — Ambulatory Visit
Admission: RE | Admit: 2015-03-29 | Discharge: 2015-03-29 | Disposition: A | Discharge status: Home / Self Care | Payer: Medicare Other | Source: Ambulatory Visit | Attending: Radiation Oncology | Admitting: Radiation Oncology

## 2015-03-29 ENCOUNTER — Telehealth: Payer: Self-pay | Admitting: *Deleted

## 2015-03-29 DIAGNOSIS — Z510 Encounter for antineoplastic radiation therapy: Secondary | ICD-10-CM | POA: Diagnosis not present

## 2015-03-29 DIAGNOSIS — D020 Carcinoma in situ of larynx: Secondary | ICD-10-CM

## 2015-03-29 NOTE — Progress Notes (Signed)
Nutrition follow-up completed with patient and wife for cancer of the vocal cord. He is receiving radiation therapy only. Patient states he has been unable to eat or drink much of anything the past 2 days. He reports swallowing water and "spit" is extremely difficult. Dietary intake reveals patient consumed applesauce and one half a bottle of premier protein and one egg so far today which is less than 300 cal. Patient does not have enough energy for walking which is something he has done the past 5 years on a daily basis.   Last weight was documented as 209.1 pounds May 23 decreased from 212.3 pounds April 27. Patient questioning feeding tube placement. Reports he is getting new medication for pain today in hopes this will improve oral intake. History noted of chronic kidney disease. Patient reports last creatinine level was between 2 and 3.  He will bring latest labs in so they can be scanned into the computer.  Nutrition diagnosis:  Predicted suboptimal energy intake has evolved into inadequate oral intake related to vocal cord cancer as evidenced by patient's self-report of minimal intake and hydration over the past 2 days.  Estimated nutrition needs: 2000-2200 cal, 90-110 grams protein, 2.2 L fluid.  Intervention:  Encouraged patient to explore strategies for temperature changes of liquids. Patient would require 5-6 bottles of ensure Enlive daily. Patient states he doesn't know how he will increase oral intake. Educated patient about benefits and concerns with feeding tube placement. Encouraged patient to begin pain medication as prescribed by physician and work to increase oral intake once medication becomes effective. Encouraged patient to monitor for dehydration and alert staff if he is unable to increase fluid intake. Contacted RN in radiation oncology and updated her on patient condition.  Monitoring, evaluation, goals:  Patient will tolerate increased oral intake to minimize  weight loss.  If patient unable to increase oral intake, recommend placement of feeding tube for adequate nutrition.  Next visit: Thursday, June 2.  **Disclaimer: This note was dictated with voice recognition software. Similar sounding words can inadvertently be transcribed and this note may contain transcription errors which may not have been corrected upon publication of note.**

## 2015-03-29 NOTE — Telephone Encounter (Signed)
Oncology Nurse Navigator Documentation  Oncology Nurse Navigator Flowsheets 03/25/2015 03/29/2015  Navigator Encounter Type Treatment Telephone  1541 - LVM with appt information, requested return call to confirm message receipt  1743 - Returned patient's VM, reviewed tomorrow's appts, informed 04/06/15 1115 MBS at [facility] Radiology, 04/08/15 1000 Swallowing consult with Garald Balding at [place] location.  He verbalized understanding.  Patient Visit Type Radonc -  Treatment Phase Treatment -  Barriers/Navigation Needs No barriers at this time -  Time Spent with Patient [place], Therapist, sports, Copywriter, advertising, [place] at [place] [phone]

## 2015-03-30 ENCOUNTER — Other Ambulatory Visit (HOSPITAL_COMMUNITY): Payer: Self-pay | Admitting: Radiation Oncology

## 2015-03-30 ENCOUNTER — Encounter: Payer: Self-pay | Admitting: *Deleted

## 2015-03-30 ENCOUNTER — Ambulatory Visit (HOSPITAL_BASED_OUTPATIENT_CLINIC_OR_DEPARTMENT_OTHER): Payer: Medicare Other

## 2015-03-30 ENCOUNTER — Ambulatory Visit
Admission: RE | Admit: 2015-03-30 | Discharge: 2015-03-30 | Disposition: A | Discharge status: Home / Self Care | Payer: Medicare Other | Source: Ambulatory Visit | Attending: Radiation Oncology | Admitting: Radiation Oncology

## 2015-03-30 ENCOUNTER — Telehealth: Payer: Self-pay | Admitting: *Deleted

## 2015-03-30 ENCOUNTER — Other Ambulatory Visit: Payer: Self-pay | Admitting: Radiation Oncology

## 2015-03-30 VITALS — BP 149/62 | HR 72 | Temp 98.6°F | Resp 16

## 2015-03-30 DIAGNOSIS — D020 Carcinoma in situ of larynx: Secondary | ICD-10-CM

## 2015-03-30 DIAGNOSIS — R1314 Dysphagia, pharyngoesophageal phase: Secondary | ICD-10-CM

## 2015-03-30 DIAGNOSIS — Z510 Encounter for antineoplastic radiation therapy: Secondary | ICD-10-CM | POA: Diagnosis not present

## 2015-03-30 LAB — BASIC METABOLIC PANEL (CC13)
Anion Gap: 13 mEq/L — ABNORMAL HIGH (ref 3–11)
BUN: 17.1 mg/dL (ref 7.0–26.0)
CO2: 25 mEq/L (ref 22–29)
Calcium: 8.7 mg/dL (ref 8.4–10.4)
Chloride: 105 mEq/L (ref 98–109)
Creatinine: 1.2 mg/dL (ref 0.7–1.3)
EGFR: 57 mL/min/{1.73_m2} — ABNORMAL LOW (ref >90)
Glucose: 104 mg/dl (ref 70–140)
Potassium: 4.5 mEq/L (ref 3.5–5.1)
Sodium: 142 mEq/L (ref 136–145)

## 2015-03-30 MED ORDER — SODIUM CHLORIDE 0.9 % IV SOLN
1000.0000 mL | Freq: Once | INTRAVENOUS | Status: AC
  Administered 2015-03-30: 1000 mL via INTRAVENOUS

## 2015-03-30 NOTE — Patient Instructions (Signed)
Dehydration, Adult Dehydration is when you lose more fluids from the body than you take in. Vital organs like the kidneys, brain, and heart cannot function without a proper amount of fluids and salt. Any loss of fluids from the body can cause dehydration.  CAUSES   Vomiting.  Diarrhea.  Excessive sweating.  Excessive urine output.  Fever. SYMPTOMS  Mild dehydration  Thirst.  Dry lips.  Slightly dry mouth. Moderate dehydration  Very dry mouth.  Sunken eyes.  Skin does not bounce back quickly when lightly pinched and released.  Dark urine and decreased urine production.  Decreased tear production.  Headache. Severe dehydration  Very dry mouth.  Extreme thirst.  Rapid, weak pulse (more than 100 beats per minute at rest).  Cold hands and feet.  Not able to sweat in spite of heat and temperature.  Rapid breathing.  Blue lips.  Confusion and lethargy.  Difficulty being awakened.  Minimal urine production.  No tears. DIAGNOSIS  Your caregiver will diagnose dehydration based on your symptoms and your exam. Blood and urine tests will help confirm the diagnosis. The diagnostic evaluation should also identify the cause of dehydration. TREATMENT  Treatment of mild or moderate dehydration can often be done at home by increasing the amount of fluids that you drink. It is best to drink small amounts of fluid more often. Drinking too much at one time can make vomiting worse. Refer to the home care instructions below. Severe dehydration needs to be treated at the hospital where you will probably be given intravenous (IV) fluids that contain water and electrolytes. HOME CARE INSTRUCTIONS   Ask your caregiver about specific rehydration instructions.  Drink enough fluids to keep your urine clear or pale yellow.  Drink small amounts frequently if you have nausea and vomiting.  Eat as you normally do.  Avoid:  Foods or drinks high in sugar.  Carbonated  drinks.  Juice.  Extremely hot or cold fluids.  Drinks with caffeine.  Fatty, greasy foods.  Alcohol.  Tobacco.  Overeating.  Gelatin desserts.  Wash your hands well to avoid spreading bacteria and viruses.  Only take over-the-counter or prescription medicines for pain, discomfort, or fever as directed by your caregiver.  Ask your caregiver if you should continue all prescribed and over-the-counter medicines.  Keep all follow-up appointments with your caregiver. SEEK MEDICAL CARE IF:  You have abdominal pain and it increases or stays in one area (localizes).  You have a rash, stiff neck, or severe headache.  You are irritable, sleepy, or difficult to awaken.  You are weak, dizzy, or extremely thirsty. SEEK IMMEDIATE MEDICAL CARE IF:   You are unable to keep fluids down or you get worse despite treatment.  You have frequent episodes of vomiting or diarrhea.  You have blood or green matter (bile) in your vomit.  You have blood in your stool or your stool looks black and tarry.  You have not urinated in 6 to 8 hours, or you have only urinated a small amount of very dark urine.  You have a fever.  You faint. MAKE SURE YOU:   Understand these instructions.  Will watch your condition.  Will get help right away if you are not doing well or get worse. Document Released: 10/22/2005 Document Revised: 01/14/2012 Document Reviewed: 06/11/2011 ExitCare Patient Information 2015 [employer]. This information is not intended to replace advice given to you by your health care provider. Make sure you discuss any questions you have with your health care   provider.  

## 2015-03-30 NOTE — Telephone Encounter (Signed)
Oncology Nurse Navigator Documentation  Oncology Nurse Navigator Flowsheets 03/25/2015 03/29/2015 03/30/2015  Navigator Encounter Type Treatment Telephone Telephone - [phone]  Informed patient that Dr. Isidore Moos is aware of 5/19 [facility] lab results, today's labs needed to gauge any changes in context of reduced nutrition/hydration over past days.  He verbalized understanding.  Patient Visit Type Radonc - -  Treatment Phase Treatment - -  Barriers/Navigation Needs No barriers at this time - -  Time Spent with Patient 30 30 15    Gayleen Orem, RN, BSN, [place] at [place] [phone]

## 2015-03-30 NOTE — Progress Notes (Addendum)
Oncology Nurse Navigator Documentation  Oncology Nurse Navigator Flowsheets 03/25/2015 03/29/2015 03/30/2015 03/30/2015  Navigator Encounter Type Treatment Telephone Telephone Treatment  Patient Visit Type Radonc - - Medonc  Treatment Phase Treatment - - Other  To provide support and encouragement, care continuity and to assess for needs, met with patient in Infusion where he was receiving scheduled IVF.   1. I answered his questions re:  Feeding tube placement and use, timeline/criteria for removal.  Upcoming MBS.   2. I discussed importance of:  Hydration and nutrition during and after treatment phase.     Baseline pain control.  He indicated he suspects some pain relief since starting 50 mcg Fentanyl yesterday afternoon. 3.   He asked about the scheduling of additional IVF.  I indicated Dr. Isidore Moos will assess for the need. 4.   I provided Epic calendar of upcoming appointments.   Barriers/Navigation Needs No barriers at this time - - -  Time Spent with Patient _0 [place], RN, BSN, [place] at [place] [phone]

## 2015-03-30 NOTE — Progress Notes (Signed)
Duplicate entry

## 2015-03-31 ENCOUNTER — Telehealth: Payer: Self-pay | Admitting: *Deleted

## 2015-03-31 ENCOUNTER — Ambulatory Visit
Admission: RE | Admit: 2015-03-31 | Discharge: 2015-03-31 | Disposition: A | Discharge status: Home / Self Care | Payer: Medicare Other | Source: Ambulatory Visit | Attending: Radiation Oncology | Admitting: Radiation Oncology

## 2015-03-31 DIAGNOSIS — Z510 Encounter for antineoplastic radiation therapy: Secondary | ICD-10-CM | POA: Diagnosis not present

## 2015-03-31 NOTE — Telephone Encounter (Signed)
Oncology Nurse Navigator Documentation  Oncology Nurse Navigator Flowsheets 03/25/2015 03/29/2015 03/30/2015 03/30/2015 03/31/2015  Navigator Encounter Type Treatment Telephone Telephone Treatment Telephone  Called patient to inform of 1400 IVF tomorrow following RT.  In response to inquiry, patient reported current level of baseline/PRN pain control is not enough to be able to swallow other daily medications.  He expressed concern that he has not been taking them over the past several days.  Dr. Isidore Moos informed.  Patient Visit Type Radonc - - Medonc -  Treatment Phase Treatment - - Other -  Barriers/Navigation Needs No barriers at this time - - - -  Time Spent with Patient 30 30 15  60 [place], RN, Copywriter, advertising, [place] at [place] [phone]

## 2015-03-31 NOTE — Telephone Encounter (Signed)
Oncology Nurse Navigator Documentation  Oncology Nurse Navigator Flowsheets 03/25/2015 03/29/2015 03/30/2015 03/30/2015 03/31/2015 03/31/2015  Navigator Encounter Type Treatment Telephone Telephone Treatment Telephone Telephone  1328 - Spoke with Charlene at [facility], arranged 1030 appt for patient's IVF.  1344 - Called patient, informed him of IVF time/location change.  He understands that I will meet him in [facility] lobby to escort him to [facility]; he understands he will see Dr. Isidore Moos after 1315 [place] tmt.  Patient Visit Type Radonc - - Medonc - -  Treatment Phase Treatment - - Other - -  Barriers/Navigation Needs No barriers at this time - - - - -  Time Spent with Patient 30 30 15  60 15 30   Gayleen Orem, RN, Copywriter, advertising, [place] at [place] [phone]

## 2015-04-01 ENCOUNTER — Encounter: Payer: Self-pay | Admitting: Radiation Oncology

## 2015-04-01 ENCOUNTER — Ambulatory Visit (HOSPITAL_COMMUNITY)
Admission: RE | Admit: 2015-04-01 | Discharge: 2015-04-01 | Disposition: A | Discharge status: Home / Self Care | Payer: Medicare Other | Source: Ambulatory Visit | Attending: Radiation Oncology | Admitting: Radiation Oncology

## 2015-04-01 ENCOUNTER — Ambulatory Visit
Admission: RE | Admit: 2015-04-01 | Discharge: 2015-04-01 | Disposition: A | Discharge status: Home / Self Care | Payer: Medicare Other | Source: Ambulatory Visit | Attending: Radiation Oncology | Admitting: Radiation Oncology

## 2015-04-01 ENCOUNTER — Encounter: Payer: Self-pay | Admitting: *Deleted

## 2015-04-01 ENCOUNTER — Ambulatory Visit: Payer: Medicare Other

## 2015-04-01 ENCOUNTER — Other Ambulatory Visit: Payer: Self-pay | Admitting: Radiation Oncology

## 2015-04-01 ENCOUNTER — Telehealth: Payer: Self-pay | Admitting: Oncology

## 2015-04-01 DIAGNOSIS — D020 Carcinoma in situ of larynx: Secondary | ICD-10-CM | POA: Diagnosis present

## 2015-04-01 DIAGNOSIS — Z510 Encounter for antineoplastic radiation therapy: Secondary | ICD-10-CM | POA: Diagnosis not present

## 2015-04-01 MED ORDER — FLUCONAZOLE 10 MG/ML PO SUSR: ORAL | Status: DC

## 2015-04-01 MED ORDER — SODIUM CHLORIDE 0.9 % IV SOLN
INTRAVENOUS | Status: AC
  Administered 2015-04-01: 10:00:00 via INTRAVENOUS

## 2015-04-01 MED ORDER — HYDROCODONE-ACETAMINOPHEN 7.5-325 MG/15ML PO SOLN: 15.0000 mL | ORAL | Status: DC | PRN

## 2015-04-01 NOTE — Progress Notes (Signed)
   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  CBCT/MVCT images/Port film x-rays were reviewed.  The chart was checked.  He reports increased difficulty swallowing and grades pain as a level 7-8/10 when swallowing food; in addition to a grainy sensation in his throat. Swallowing lidocaine before eating makes him feel sick. He is forcing his food intake. He reports that he had a total of 465 calories yesterday and day before only 325 calories. The pt is using a fentanyl 50 mcg and just started using it this Monday. His voice is very hoarse. The pt describes that when he swallows, it feels like it is moving in different directions and into his nose. He is to see a nutritionist and speech language therapist next week. Prescribed Vicodin 1-2 tabs per hour for pain. He is unable to swallow it. He crushes the pills and mixes it into applesauce and is still unable to swallow the medication. The pt has lost 10 pounds since his last visit 2 weeks ago.  Physical Findings:  vitals were not taken for this visit.  Wt Readings from Last 3 Encounters:  04/01/15 203 lb 12.8 oz (92.443 kg)  03/14/15 213 lb 3.2 oz (96.707 kg)  03/02/15 212 lb 4.8 oz (96.299 kg)   Vitals - 1 value per visit [id number]  SYSTOLIC [id number]  DIASTOLIC 0000000  Pulse 97  Temperature 98.8  Respirations 16  Weight (lb) 203.8  Height 5\' 10"   BMI 29.24  Skin erythematous, oropharynx clear   Impression:  The patient is tolerating radiotherapy with difficulty.  Plan:  Continue radiotherapy as planned.  Discussed importance of PO intake. I will have an order for a PEG tube  placed by interventional radiology. I have placed orders for IV fluids (normal saline over 2 hours) tomorrow,Tuesday, and Thursday and Fri of next week at Med-Onc. I advised the pt to hold the Vicodin. I have prescribed Hycet in its place. I have also prescribed a liquid form of fluconazole just in case if the pt's sore throat is due to a fungal infection. The pt should hold simvastatin since it would react with the fluconazole. The pt is to see a nutritionist and speech language therapist as scheduled. Peg tube ordered.  This document serves as a record of services personally performed by Eppie Gibson, MD. It was created on her behalf by Darcus Austin, a trained medical scribe. The creation of this record is based on the scribe's personal observations and the provider's statements to them. This document has been checked and approved by the attending provider.     ________________________________   Eppie Gibson, M.D.

## 2015-04-01 NOTE — Progress Notes (Signed)
Victor [name] has received 16 fractions to his larynx and has lost 11 lbs since 5/16.  He reports increased difficulty swallowing and grades pain as a level 7-8/10 when swallowing food, in addition to, a grainy sensation in his throat.  He is forcing food.  Had a total of 465 calories on yesterday and day before 325 calories.  Reports darkened urine and admits that he knows he needs to drink more fluids.

## 2015-04-01 NOTE — Progress Notes (Signed)
Oncology Nurse Navigator Documentation  Oncology Nurse Navigator Flowsheets 03/29/2015 03/30/2015 03/30/2015 03/31/2015 03/31/2015 04/01/2015 04/01/2015  Navigator Encounter Type Telephone Telephone Treatment Telephone Telephone Other Treatment To provide support and encouragement, met with patient during RT.  He was accompanied by his wife.  He completed without incident.   Patient Visit Type - - Medonc - - - Follow-up To provide care continuity, joined patient during PUT with Dr. Isidore Moos after RT.  Patient's primary concern is that he is unable to swallow w/o severe pain; concerned that he is unable to take current pill/tablet meds.  Otherwise, current 50 mcg Fentanyl is providing adequate baseline pain control.  Dr. Isidore Moos provide Rx for Hycet PRN as replacement for Vicodin.  Dr. Isidore Moos had further discussion with patient re: PEG placement, he expressed interest.  She stated she will place order.  Patient verbalized Dr. Pearlie Oyster understanding he will receive IVF tomorrow and Tues, Thurs and Friday of next week.  Treatment Phase - - Other - - - Treatment  Barriers/Navigation Needs - - - - - - -  Time Spent with Patient 80 22 33 _0 [place], RN, BSN, [place] at [place] [phone]

## 2015-04-01 NOTE — Progress Notes (Signed)
Oncology Nurse Navigator Documentation  Oncology Nurse Navigator Flowsheets 03/25/2015 03/29/2015 03/30/2015 03/30/2015 03/31/2015 03/31/2015 04/01/2015  Navigator Encounter Type Treatment Telephone Telephone Treatment Telephone Telephone Other  Met patient in [facility] lobby, escorted him to [facility] for IVF d/t first visit at this facility.  Patient Visit Type Radonc - - Medonc - - -  Treatment Phase Treatment - - Other - - -  Barriers/Navigation Needs No barriers at this time - - - - - -  Time Spent with Patient _0 60 _1 Gayleen Orem, RN, BSN, [place] at [place] [phone]

## 2015-04-01 NOTE — Telephone Encounter (Signed)
Called Victor [name] and advised him that an appointment has been made for IV fluids in the infusion area of the [place] for 8:30 tomorrow, 04/02/15.  Also advised him that appointments have been made in the [place] on Tuesday, 04/05/15 at 11:00 am, Thursday, 04/07/15 at 11:00 am and Friday, 04/08/15 at 2:00 pm.  [name] verbalized agreement and also asked for a new schedule to help remember the appointments.

## 2015-04-01 NOTE — Procedures (Signed)
[facility]  Procedure Note  Victor [name] [id number] DOB: 1936/06/21 DOA: 04/01/2015   PCP: Junie Panning, NP   Associated Diagnosis: Carcinoma in situ of vocal cord (231.0)  Procedure Note: IV infusion of normal saline fluids   Condition During Procedure: Pt tolerated well   Condition at Discharge:  No complications noted   Nigel Sloop, [facility]

## 2015-04-02 ENCOUNTER — Ambulatory Visit (HOSPITAL_BASED_OUTPATIENT_CLINIC_OR_DEPARTMENT_OTHER): Payer: Medicare Other

## 2015-04-02 VITALS — BP 138/48 | HR 65 | Resp 18

## 2015-04-02 DIAGNOSIS — D020 Carcinoma in situ of larynx: Secondary | ICD-10-CM | POA: Diagnosis not present

## 2015-04-02 MED ORDER — SODIUM CHLORIDE 0.9 % IV SOLN
INTRAVENOUS | Status: AC
  Administered 2015-04-02: 09:00:00 via INTRAVENOUS

## 2015-04-02 NOTE — Patient Instructions (Signed)
Dehydration, Adult Dehydration is when you lose more fluids from the body than you take in. Vital organs like the kidneys, brain, and heart cannot function without a proper amount of fluids and salt. Any loss of fluids from the body can cause dehydration.  CAUSES   Vomiting.  Diarrhea.  Excessive sweating.  Excessive urine output.  Fever. SYMPTOMS  Mild dehydration  Thirst.  Dry lips.  Slightly dry mouth. Moderate dehydration  Very dry mouth.  Sunken eyes.  Skin does not bounce back quickly when lightly pinched and released.  Dark urine and decreased urine production.  Decreased tear production.  Headache. Severe dehydration  Very dry mouth.  Extreme thirst.  Rapid, weak pulse (more than 100 beats per minute at rest).  Cold hands and feet.  Not able to sweat in spite of heat and temperature.  Rapid breathing.  Blue lips.  Confusion and lethargy.  Difficulty being awakened.  Minimal urine production.  No tears. DIAGNOSIS  Your caregiver will diagnose dehydration based on your symptoms and your exam. Blood and urine tests will help confirm the diagnosis. The diagnostic evaluation should also identify the cause of dehydration. TREATMENT  Treatment of mild or moderate dehydration can often be done at home by increasing the amount of fluids that you drink. It is best to drink small amounts of fluid more often. Drinking too much at one time can make vomiting worse. Refer to the home care instructions below. Severe dehydration needs to be treated at the hospital where you will probably be given intravenous (IV) fluids that contain water and electrolytes. HOME CARE INSTRUCTIONS   Ask your caregiver about specific rehydration instructions.  Drink enough fluids to keep your urine clear or pale yellow.  Drink small amounts frequently if you have nausea and vomiting.  Eat as you normally do.  Avoid:  Foods or drinks high in sugar.  Carbonated  drinks.  Juice.  Extremely hot or cold fluids.  Drinks with caffeine.  Fatty, greasy foods.  Alcohol.  Tobacco.  Overeating.  Gelatin desserts.  Wash your hands well to avoid spreading bacteria and viruses.  Only take over-the-counter or prescription medicines for pain, discomfort, or fever as directed by your caregiver.  Ask your caregiver if you should continue all prescribed and over-the-counter medicines.  Keep all follow-up appointments with your caregiver. SEEK MEDICAL CARE IF:  You have abdominal pain and it increases or stays in one area (localizes).  You have a rash, stiff neck, or severe headache.  You are irritable, sleepy, or difficult to awaken.  You are weak, dizzy, or extremely thirsty. SEEK IMMEDIATE MEDICAL CARE IF:   You are unable to keep fluids down or you get worse despite treatment.  You have frequent episodes of vomiting or diarrhea.  You have blood or green matter (bile) in your vomit.  You have blood in your stool or your stool looks black and tarry.  You have not urinated in 6 to 8 hours, or you have only urinated a small amount of very dark urine.  You have a fever.  You faint. MAKE SURE YOU:   Understand these instructions.  Will watch your condition.  Will get help right away if you are not doing well or get worse. Document Released: 10/22/2005 Document Revised: 01/14/2012 Document Reviewed: 06/11/2011 ExitCare Patient Information 2015 [employer]. This information is not intended to replace advice given to you by your health care provider. Make sure you discuss any questions you have with your health care   provider.  

## 2015-04-05 ENCOUNTER — Telehealth: Payer: Self-pay | Admitting: *Deleted

## 2015-04-05 ENCOUNTER — Encounter: Payer: Self-pay | Admitting: *Deleted

## 2015-04-05 ENCOUNTER — Ambulatory Visit (HOSPITAL_COMMUNITY)
Admission: RE | Admit: 2015-04-05 | Discharge: 2015-04-05 | Disposition: A | Discharge status: Home / Self Care | Payer: Medicare Other | Source: Ambulatory Visit | Attending: Radiation Oncology | Admitting: Radiation Oncology

## 2015-04-05 ENCOUNTER — Ambulatory Visit
Admission: RE | Admit: 2015-04-05 | Discharge: 2015-04-05 | Disposition: A | Discharge status: Home / Self Care | Payer: Medicare Other | Source: Ambulatory Visit | Attending: Radiation Oncology | Admitting: Radiation Oncology

## 2015-04-05 ENCOUNTER — Other Ambulatory Visit: Payer: Self-pay | Admitting: Radiology

## 2015-04-05 DIAGNOSIS — Z510 Encounter for antineoplastic radiation therapy: Secondary | ICD-10-CM | POA: Diagnosis not present

## 2015-04-05 DIAGNOSIS — D020 Carcinoma in situ of larynx: Secondary | ICD-10-CM

## 2015-04-05 MED ORDER — SODIUM CHLORIDE 0.9 % IV SOLN
INTRAVENOUS | Status: AC
  Administered 2015-04-05: 11:00:00 via INTRAVENOUS

## 2015-04-05 NOTE — Telephone Encounter (Signed)
CALLED AND CANCELLED MBS FOR THIS PATIENT, PER RICK DIEHL, SPOKE WITH WENDY AND SHE IS CANCELLING THIS

## 2015-04-05 NOTE — Progress Notes (Signed)
Oncology Nurse Navigator Documentation  Oncology Nurse Navigator Flowsheets 03/30/2015 03/30/2015 03/31/2015 03/31/2015 04/01/2015 04/01/2015 04/05/2015  Navigator Encounter Type Telephone Treatment Telephone Telephone Other Treatment Treatment;Other  1. I arranged with [facility], patient's PEG placement for 1230 04/06/15.  Communicated to [place] to cancel MBS scheduled for tomorrow.  I obtained barium contrast from [facility] for delivery to patient. 2. Arranged with Faith, RTT, to reschedule patient's RT so completed prior to PEG.  She rescheduled for 1100. 3. While patient receiving tmt, spoke with wife, she reported:  His pain is much better controlled now with Fentanyl.  His oral intake was much improved over the weekend, included mac & cheese, applesauce, smoothie, hard boiled egg, pancakes with lots of syrup, warmed/softened chocolate chip cookie, pudding.  According to his records, he has consumed ca. 1000 calories/d.  Weight has been stable over the past couple of days.  She stated he is continues to want PEG despite improved oral intake. 4. Talked with patient and his wife after RT, explained changes in schedule for tomorrow, provided barium prep and reviewed instructions.  They verbalized understanding.     Patient Visit Type - Medonc - - - Follow-up Radonc  Treatment Phase - Other - - - Treatment -  Barriers/Navigation Needs - - - - - - -  Time Spent with Patient 15 60 15 30 15  33 [place], RN, Copywriter, advertising, [place] at [place] [phone]

## 2015-04-05 NOTE — Procedures (Signed)
[facility]  Procedure Note  Victor [name] [id number] DOB: [birth year] DOA: 04/05/2015   PCP: Junie Panning, NP   Associated Diagnosis: Carcinoma in situ of vocal cord (231.0)  Procedure Note:  IV infusion of normal saline   Condition During Procedure:  Pt tolerated well   Condition at Discharge:  No complications noted   Nigel Sloop, [facility]

## 2015-04-06 ENCOUNTER — Other Ambulatory Visit (HOSPITAL_COMMUNITY): Payer: Medicare Other

## 2015-04-06 ENCOUNTER — Encounter: Payer: Self-pay | Admitting: Radiation Oncology

## 2015-04-06 ENCOUNTER — Other Ambulatory Visit (HOSPITAL_COMMUNITY): Payer: Self-pay | Admitting: Radiation Oncology

## 2015-04-06 ENCOUNTER — Ambulatory Visit (HOSPITAL_COMMUNITY)
Admission: RE | Admit: 2015-04-06 | Discharge: 2015-04-06 | Disposition: A | Discharge status: Home / Self Care | Payer: Medicare Other | Source: Ambulatory Visit | Attending: Radiation Oncology | Admitting: Radiation Oncology

## 2015-04-06 ENCOUNTER — Ambulatory Visit
Admission: RE | Admit: 2015-04-06 | Discharge: 2015-04-06 | Disposition: A | Discharge status: Home / Self Care | Payer: Medicare Other | Source: Ambulatory Visit | Attending: Radiation Oncology | Admitting: Radiation Oncology

## 2015-04-06 ENCOUNTER — Ambulatory Visit (HOSPITAL_COMMUNITY): Payer: Medicare Other

## 2015-04-06 ENCOUNTER — Encounter (HOSPITAL_COMMUNITY): Payer: Self-pay

## 2015-04-06 DIAGNOSIS — Z7982 Long term (current) use of aspirin: Secondary | ICD-10-CM | POA: Diagnosis not present

## 2015-04-06 DIAGNOSIS — I70213 Atherosclerosis of native arteries of extremities with intermittent claudication, bilateral legs: Secondary | ICD-10-CM | POA: Diagnosis not present

## 2015-04-06 DIAGNOSIS — M1990 Unspecified osteoarthritis, unspecified site: Secondary | ICD-10-CM | POA: Insufficient documentation

## 2015-04-06 DIAGNOSIS — R1314 Dysphagia, pharyngoesophageal phase: Secondary | ICD-10-CM | POA: Insufficient documentation

## 2015-04-06 DIAGNOSIS — E785 Hyperlipidemia, unspecified: Secondary | ICD-10-CM | POA: Insufficient documentation

## 2015-04-06 DIAGNOSIS — Z510 Encounter for antineoplastic radiation therapy: Secondary | ICD-10-CM | POA: Diagnosis not present

## 2015-04-06 DIAGNOSIS — N183 Chronic kidney disease, stage 3 (moderate): Secondary | ICD-10-CM | POA: Diagnosis not present

## 2015-04-06 DIAGNOSIS — M5136 Other intervertebral disc degeneration, lumbar region: Secondary | ICD-10-CM | POA: Insufficient documentation

## 2015-04-06 DIAGNOSIS — C320 Malignant neoplasm of glottis: Secondary | ICD-10-CM | POA: Insufficient documentation

## 2015-04-06 DIAGNOSIS — N400 Enlarged prostate without lower urinary tract symptoms: Secondary | ICD-10-CM | POA: Insufficient documentation

## 2015-04-06 DIAGNOSIS — D020 Carcinoma in situ of larynx: Secondary | ICD-10-CM

## 2015-04-06 DIAGNOSIS — K219 Gastro-esophageal reflux disease without esophagitis: Secondary | ICD-10-CM | POA: Insufficient documentation

## 2015-04-06 DIAGNOSIS — R1310 Dysphagia, unspecified: Secondary | ICD-10-CM

## 2015-04-06 HISTORY — DX: Sleep apnea, unspecified: G47.30

## 2015-04-06 LAB — CBC WITH DIFFERENTIAL/PLATELET
Basophils Absolute: 0 10*3/uL (ref 0.0–0.1)
Basophils Relative: 1 % (ref 0–1)
Eosinophils Absolute: 0.1 10*3/uL (ref 0.0–0.7)
Eosinophils Relative: 1 % (ref 0–5)
HCT: 41.7 % (ref 39.0–52.0)
Hemoglobin: 13.3 g/dL (ref 13.0–17.0)
Lymphocytes Relative: 18 % (ref 12–46)
Lymphs Abs: 1.2 10*3/uL (ref 0.7–4.0)
MCH: 28.6 pg (ref 26.0–34.0)
MCHC: 31.9 g/dL (ref 30.0–36.0)
MCV: 89.7 fL (ref 78.0–100.0)
Monocytes Absolute: 0.5 10*3/uL (ref 0.1–1.0)
Monocytes Relative: 7 % (ref 3–12)
Neutro Abs: 5.1 10*3/uL (ref 1.7–7.7)
Neutrophils Relative %: 73 % (ref 43–77)
Platelets: 146 10*3/uL — ABNORMAL LOW (ref 150–400)
RBC: 4.65 MIL/uL (ref 4.22–5.81)
RDW: 15.5 % (ref 11.5–15.5)
WBC: 7 10*3/uL (ref 4.0–10.5)

## 2015-04-06 LAB — APTT: aPTT: 31 seconds (ref 24–37)

## 2015-04-06 LAB — PROTIME-INR
INR: 1.09 (ref 0.00–1.49)
Prothrombin Time: 14.3 seconds (ref 11.6–15.2)

## 2015-04-06 MED ORDER — SODIUM CHLORIDE 0.9 % IV SOLN: INTRAVENOUS | Status: DC

## 2015-04-06 MED ORDER — GLUCAGON HCL RDNA (DIAGNOSTIC) 1 MG IJ SOLR
INTRAMUSCULAR | Status: AC
  Filled 2015-04-06: qty 1

## 2015-04-06 MED ORDER — LIDOCAINE HCL 1 % IJ SOLN
INTRAMUSCULAR | Status: AC
  Filled 2015-04-06: qty 20

## 2015-04-06 MED ORDER — MIDAZOLAM HCL 2 MG/2ML IJ SOLN
INTRAMUSCULAR | Status: AC
  Filled 2015-04-06: qty 6

## 2015-04-06 MED ORDER — IOHEXOL 300 MG/ML  SOLN
50.0000 mL | Freq: Once | INTRAMUSCULAR | Status: AC | PRN
  Administered 2015-04-06: 10 mL

## 2015-04-06 MED ORDER — CEFAZOLIN SODIUM-DEXTROSE 2-3 GM-% IV SOLR
2.0000 g | INTRAVENOUS | Status: AC
  Administered 2015-04-06: 2 g via INTRAVENOUS

## 2015-04-06 MED ORDER — CEFAZOLIN SODIUM-DEXTROSE 2-3 GM-% IV SOLR
INTRAVENOUS | Status: AC
  Filled 2015-04-06: qty 50

## 2015-04-06 MED ORDER — MIDAZOLAM HCL 2 MG/2ML IJ SOLN
INTRAMUSCULAR | Status: AC | PRN
  Administered 2015-04-06 (×3): 1 mg via INTRAVENOUS

## 2015-04-06 MED ORDER — GLUCAGON HCL RDNA (DIAGNOSTIC) 1 MG IJ SOLR
INTRAMUSCULAR | Status: AC | PRN
  Administered 2015-04-06: 1 mg via INTRAVENOUS

## 2015-04-06 MED ORDER — SODIUM CHLORIDE 0.9 % IV SOLN
INTRAVENOUS | Status: DC
  Administered 2015-04-06: 13:00:00 via INTRAVENOUS

## 2015-04-06 MED ORDER — FENTANYL CITRATE (PF) 100 MCG/2ML IJ SOLN
INTRAMUSCULAR | Status: AC
  Filled 2015-04-06: qty 4

## 2015-04-06 MED ORDER — FENTANYL CITRATE (PF) 100 MCG/2ML IJ SOLN
INTRAMUSCULAR | Status: AC | PRN
  Administered 2015-04-06: 50 ug via INTRAVENOUS

## 2015-04-06 NOTE — Discharge Instructions (Signed)
Cleanse site with soap and water, dress with triple-antibiotic ointment and cover with gauze for 7 days.  Do not use scissors near the catheter.  For questions/concerns regarding the G-tube contact the Interventional Radiology department at [phone].   Conscious Sedation, Adult, Care After Refer to this sheet in the next few weeks. These instructions provide you with information on caring for yourself after your procedure. Your health care provider may also give you more specific instructions. Your treatment has been planned according to current medical practices, but problems sometimes occur. Call your health care provider if you have any problems or questions after your procedure. WHAT TO EXPECT AFTER THE PROCEDURE  After your procedure:  You may feel sleepy, clumsy, and have poor balance for several hours.  Vomiting may occur if you eat too soon after the procedure. HOME CARE INSTRUCTIONS  Do not participate in any activities where you could become injured for at least 24 hours. Do not:  Drive.  Swim.  Ride a bicycle.  Operate heavy machinery.  Cook.  Use power tools.  Climb ladders.  Work from a high place.  Do not make important decisions or sign legal documents until you are improved.  If you vomit, drink water, juice, or soup when you can drink without vomiting. Make sure you have little or no nausea before eating solid foods.  Only take over-the-counter or prescription medicines for pain, discomfort, or fever as directed by your health care provider.  Make sure you and your family fully understand everything about the medicines given to you, including what side effects may occur.  You should not drink alcohol, take sleeping pills, or take medicines that cause drowsiness for at least 24 hours.  If you smoke, do not smoke without supervision.  If you are feeling better, you may resume normal activities 24 hours after you were sedated.  Keep all appointments with  your health care provider. SEEK MEDICAL CARE IF:  Your skin is pale or bluish in color.  You continue to feel nauseous or vomit.  Your pain is getting worse and is not helped by medicine.  You have bleeding or swelling.  You are still sleepy or feeling clumsy after 24 hours. SEEK IMMEDIATE MEDICAL CARE IF:  You develop a rash.  You have difficulty breathing.  You develop any type of allergic problem.  You have a fever. MAKE SURE YOU:  Understand these instructions.  Will watch your condition.  Will get help right away if you are not doing well or get worse. Document Released: 08/12/2013 Document Reviewed: 08/12/2013 [facility] Patient Information 2015 [place], [place]. This information is not intended to replace advice given to you by your health care provider. Make sure you discuss any questions you have with your health care provider.  Clear Liquid Diet A clear liquid diet is a short-term diet. It is made up of liquids or solids that become liquids at room temperature. You should be able to see through the liquid. WHAT CAN I HAVE? You may have any of the following if your doctor says they are okay:  Vegetable juices or fruit juices that do not have pulp.  Coffee.  Tea.  Soda.  Clear broth or strained broth-based soups.  High-protein gelatins and flavored gelatins.  Sugar and honey.  Ices or frozen ice pops that do not have milk. If you are not sure whether you can have a certain liquid, ask your doctor. You may also ask your doctor about other clear liquid options. Document  Released: 10/04/2008 Document Revised: 10/27/2013 Document Reviewed: 09/18/2013 [facility] Patient Information 2015 [place], [place]. This information is not intended to replace advice given to you by your health care provider. Make sure you discuss any questions you have with your health care provider. Gastrostomy Tube Home Guide A gastrostomy tube is a tube that is surgically placed into the  stomach. It is also called a "G-tube." G-tubes are used when a person is unable to eat and drink enough on their own to stay healthy. The tube is inserted into the stomach through a small cut (incision) in the skin. This tube is used for:  Feeding.  Giving medication. GASTROSTOMY TUBE CARE  Wash your hands with soap and water.  Remove the old dressing (if any). Some styles of G-tubes may need a dressing inserted between the skin and the G-tube. Other types of G-tubes do not require a dressing. Ask your health care provider if a dressing is needed.  Check the area where the tube enters the skin (insertion site) for redness, swelling, or pus-like (purulent) drainage. A small amount of clear or tan liquid drainage is normal. Check to make sure scar tissue (skin) is not growing around the insertion site. This could have a raised, bumpy appearance.  A cotton swab can be used to clean the skin around the tube:  When the G-tube is first put in, a normal saline solution or water can be used to clean the skin.  Mild soap and warm water can be used when the skin around the G-tube site has healed.  Roll the cotton swab around the G-tube insertion site to remove any drainage or crusting at the insertion site. STOMACH RESIDUALS Feeding tube residuals are the amount of liquids that are in the stomach at any given time. Residuals may be checked before giving feedings, medications, or as instructed by your health care provider.  Ask your health care provider if there are instances when you would not start tube feedings depending on the amount or type of contents withdrawn from the stomach.  Check residuals by attaching a syringe to the G-tube and pulling back on the syringe plunger. Note the amount, and return the residual back into the stomach. FLUSHING THE G-TUBE  The G-tube should be periodically flushed with clean warm water to keep it from clogging.  Flush the G-tube after feedings or medications.  Draw up 30 mL of warm water in a syringe. Connect the syringe to the G-tube and slowly push the water into the tube.  Do not push feedings, medications, or flushes rapidly. Flush the G-tube gently and slowly.  Only use syringes made for G-tubes to flush medications or feedings.  Your health care provider may want the G-tube flushed more often or with more water. If this is the case, follow your health care provider's instructions. FEEDINGS Your health care provider will determine whether feedings are given as a bolus (a certain amount given at one time and at scheduled times) or whether feedings will be given continuously on a feeding pump.   Formulas should be given at room temperature.  If feedings are continuous, no more than 4 hours worth of feedings should be placed in the feeding bag. This helps prevent spoilage or accidental excess infusion.  Cover and place unused formula in the refrigerator.  If feedings are continuous, stop the feedings when medications or flushes are given. Be sure to restart the feedings.  Feeding bags and syringes should be replaced as instructed by your health  care provider. GIVING MEDICATION   In general, it is best if all medications are in a liquid form for G-tube administration. Liquid medications are less likely to clog the G-tube.  Mix the liquid medication with 30 mL (or amount recommended by your health care provider) of warm water.  Draw up the medication into the syringe.  Attach the syringe to the G-tube and slowly push the mixture into the G-tube.  After giving the medication, draw up 30 mL of warm water in the syringe and slowly flush the G-tube.  For pills or capsules, check with your health care provider first before crushing medications. Some pills are not effective if they are crushed. Some capsules are sustained-release medications.  If appropriate, crush the pill or capsule and mix with 30 mL of warm water. Using the syringe, slowly  push the medication through the tube, then flush the tube with another 30 mL of tap water. G-TUBE PROBLEMS G-tube was pulled out.  Cause: May have been pulled out accidentally.  Solutions: Cover the opening with clean dressing and tape. Call your health care provider right away. The G-tube should be put in as soon as possible (within 4 hours) so the G-tube opening (tract) does not close. The G-tube needs to be put in at a health care setting. An X-ray needs to be done to confirm placement before the G-tube can be used again. Redness, irritation, soreness, or foul odor around the gastrostomy site.  Cause: May be caused by leakage or infection.  Solutions: Call your health care provider right away. Large amount of leakage of fluid or mucus-like liquid present (a large amount means it soaks clothing).  Cause: Many reasons could cause the G-tube to leak.  Solutions: Call your health care provider to discuss the amount of leakage. Skin or scar tissue appears to be growing where tube enters skin.   Cause: Tissue growth may develop around the insertion site if the G-tube is moved or pulled on excessively.  Solutions: Secure tube with tape so that excess movement does not occur. Call your health care provider. G-tube is clogged.  Cause: Thick formula or medication.  Solutions: Try to slowly push warm water into the tube with a large syringe. Never try to push any object into the tube to unclog it. Do not force fluid into the G-tube. If you are unable to unclog the tube, call your health care provider right away. TIPS  Head of bed (HOB) position refers to the upright position of a person's upper body.  When giving medications or a feeding bolus, keep the [facility] up as told by your health care provider. Do this during the feeding and for 1 hour after the feeding or medication administration.  If continuous feedings are being given, it is best to keep the [facility] up as told by your health care  provider. When ADLs (activities of daily living) are performed and the [facility] needs to be flat, be sure to turn the feeding pump off. Restart the feeding pump when the [facility] is returned to the recommended height.  Do not pull or put tension on the tube.  To prevent fluid backflow, kink the G-tube before removing the cap or disconnecting a syringe.  Check the G-tube length every day. Measure from the insertion site to the end of the G-tube. If the length is longer than previous measurements, the tube may be coming out. Call your health care provider if you notice increasing G-tube length.  Oral care, such as  brushing teeth, must be continued.  You may need to remove excess air (vent) from the G-tube. Your health care provider will tell you if this is needed.  Always call your health care provider if you have questions or problems with the G-tube. SEEK IMMEDIATE MEDICAL CARE IF:   You have severe abdominal pain, tenderness, or abdominal bloating (distension).  You have nausea or vomiting.  You are constipated or have problems moving your bowels.  The G-tube insertion site is red, swollen, has a foul smell, or has yellow or brown drainage.  You have difficulty breathing or shortness of breath.  You have a fever.  You have a large amount of feeding tube residuals.  The G-tube is clogged and cannot be flushed. MAKE SURE YOU:   Understand these instructions.  Will watch your condition.  Will get help right away if you are not doing well or get worse. Document Released: 12/31/2001 Document Revised: 03/08/2014 Document Reviewed: 06/29/2013 [facility] Patient Information 2015 [place], [place]. This information is not intended to replace advice given to you by your health care provider. Make sure you discuss any questions you have with your health care provider.

## 2015-04-06 NOTE — Progress Notes (Signed)
Placed call to Gayleen Orem, RN navigator for this pt.  Pt having g-tube placed today and home health needs to be set up.  Left voicemail for Mr.Diehl to follow up on matter.

## 2015-04-06 NOTE — Procedures (Signed)
SUCCESSGUL [place] NO COMP STABLE FULL REPORT IN PACS

## 2015-04-06 NOTE — Progress Notes (Signed)
Spoke with Gayleen Orem about home health setup, and he states Enid Derry, Retail banker had called them yesterday for the initial referral.  She spoke with [place] with home health and they should be contacting the pt in the next day for g-tube teaching. Pt informed of this.

## 2015-04-06 NOTE — H&P (Signed)
Chief Complaint: "I'm getting a stomach tube"  Referring Physician(s): Squire,Sarah  History of Present Illness: Victor [name] is a 79 y.o. 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 He has increasing dysphagia /weight loss now and presents today for percutaneous gastrostomy tube placement for nutritional support .  Past Medical History  Diagnosis Date  . Thyroid disease   . Arthritis   . Cancer 11/12/14    vocal cord  carcinoma in situ   . BPH (benign prostatic hyperplasia)   . GERD (gastroesophageal reflux disease)   . Atherosclerosis   . H/O carotid atherosclerosis     b/l  . Carotid bruit   . Bulging lumbar disc   . Chronic kidney disease     chronic stage III  . Dysphonia   . Hyperlipidemia   . Spondylosis of lumbosacral region   . Occasional tremors     left hand managed with propranolol  . DDD (degenerative disc disease), lumbar   . Atherosclerotic PVD with intermittent claudication   . Peripheral vascular angioplasty status with implants and grafts   . Dysplasia of true vocal cord   . Sleep apnea     hasn't used CPAP in years    Past Surgical History  Procedure Laterality Date  . Vocal cord biopsy  11/12/14    squamous cell carcinoma in situ  . Appendectomy    . Tonsillectomy    . Mouth surgery      tooth extraction  . Thyroid surgery      Allergies: Review of patient's allergies indicates no known allergies.  Medications: Prior to Admission medications   Medication Sig Start Date End Date Taking? Authorizing Provider  aspirin 81 MG tablet Take 81 mg by mouth daily.   Yes Historical Provider, MD  diclofenac sodium (VOLTAREN) 1 % GEL Apply 2 g topically as needed (pain).    Yes Historical Provider, MD  doxazosin (CARDURA) 2 MG tablet Take 2 mg by mouth daily.   Yes Historical Provider, MD  emollient (BIAFINE) cream Apply 1 application topically 2 (two) times daily.   Yes Historical Provider,  MD  [id number]-B6-[id number] 3-Phytoster (ANIMI-3/VITAMIN D PO) Take 1 tablet by mouth daily. d3   Yes Historical Provider, MD  fentaNYL (DURAGESIC - DOSED MCG/HR) 50 MCG/HR Place 1 patch (50 mcg total) onto the skin every 3 (three) days. 03/28/15  Yes Eppie Gibson, MD  fluconazole (DIFLUCAN) 10 MG/ML suspension Take 200 mg (20 mL) today, then 100 mg (10 mL) for 6 more days. Hold simvastatin while on this drug. 04/01/15  Yes Eppie Gibson, MD  HYDROcodone-acetaminophen (HYCET) 7.5-325 mg/15 ml solution Take 15 mLs by mouth every 4 (four) hours as needed for moderate pain. 04/01/15  Yes Eppie Gibson, MD  levothyroxine (SYNTHROID, LEVOTHROID) 150 MCG tablet Take 150 mcg by mouth daily before breakfast.   Yes Historical Provider, MD  Liniments (SALONPAS EX) Apply topically as needed. patches   Yes Historical Provider, MD  Loratadine (CLARITIN PO) Take 10 mg by mouth as needed (allergies).    Yes Historical Provider, MD  MEGARED OMEGA-3 KRILL OIL 500 MG CAPS Take 1 tablet by mouth daily.    Yes Historical Provider, MD  omeprazole (PRILOSEC) 20 MG capsule Take 20 mg by mouth daily.   Yes Historical Provider, MD  propranolol (INDERAL) 80 MG tablet Take 80 mg by mouth daily.    Yes Historical Provider, MD  ranitidine (ZANTAC) 150 MG tablet Take 150  mg by mouth daily.   Yes Historical Provider, MD  simvastatin (ZOCOR) 40 MG tablet Take 40 mg by mouth daily.   Yes Historical Provider, MD  traMADol (ULTRAM) 50 MG tablet Take by mouth every 6 (six) hours as needed for severe pain.    Yes Historical Provider, MD  lidocaine (XYLOCAINE) 2 % solution Patient: Mix 1part 2% viscous lidocaine, 1part H20. Swallow 29mL of this mixture, 59min before meals and at bedtime, up to QID Patient not taking: Reported on 04/06/2015 03/14/15   Eppie Gibson, MD  senna (SENOKOT) 8.6 MG TABS tablet Take 1 tab QHS to prevent constipation while on Duragesic, but stop if you have diarrhea. Patient not taking: Reported on 04/06/2015 03/28/15   Eppie Gibson, MD  sucralfate (CARAFATE) 1 G tablet Dissolve 1 tablet in 10 mL H20 and swallow up to QID prn sore throat Patient not taking: Reported on 04/06/2015 03/14/15   Eppie Gibson, MD    History reviewed. No pertinent family history.  History   Social History  . Marital Status: Married    Spouse Name: N/A  . Number of Children: N/A  . Years of Education: N/A   Social History Main Topics  . Smoking status: Never Smoker   . Smokeless tobacco: Not on file  . Alcohol Use: No  . Drug Use: No  . Sexual Activity: Not on file   Other Topics Concern  . None   Social History Narrative      Review of Systems  Constitutional: Negative for fever.       Occ chills  HENT: Positive for sore throat, trouble swallowing and voice change.   Respiratory:       Occ cough,dyspnea  Cardiovascular: Negative for chest pain.  Gastrointestinal: Negative for nausea, vomiting, abdominal pain and blood in stool.  Genitourinary: Negative for dysuria and hematuria.  Musculoskeletal: Negative for back pain.  Neurological: Negative for headaches.    Vital Signs: BP 150/68 mmHg  Pulse 51  Temp(Src) 97.9 F (36.6 C) (Oral)  Resp 16  Ht 5\' 10"  (1.778 m)  Wt 202 lb (91.627 kg)  BMI 28.98 kg/m2  SpO2 96%  Physical Exam  Constitutional: He is oriented to person, place, and time. He appears well-developed and well-nourished.  Cardiovascular: Regular rhythm.   bradycardic  Pulmonary/Chest: Effort normal and breath sounds normal.  Abdominal: Soft. Bowel sounds are normal. There is no tenderness.  Musculoskeletal: Normal range of motion. He exhibits no edema.  Neurological: He is alert and oriented to person, place, and time.    Imaging: No results found.  Labs:  CBC:  Recent Labs  04/06/15 1305  WBC 7.0  HGB 13.3  HCT 41.7  PLT 146*    COAGS:  Recent Labs  04/06/15 1305  INR 1.09  APTT 31    BMP:  Recent Labs  03/30/15 1149  NA 142  K 4.5  CO2 25  GLUCOSE 104  BUN  17.1  CALCIUM 8.7  CREATININE 1.2    LIVER FUNCTION TESTS: No results for input(s): BILITOT, AST, ALT, ALKPHOS, PROT, ALBUMIN in the last 8760 hours.  TUMOR MARKERS: No results for input(s): AFPTM, CEA, CA199, CHROMGRNA in the last 8760 hours.  Assessment and Plan: Victor [name] is a 79 y.o. 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 He has increasing dysphagia /weight loss now and presents today for percutaneous gastrostomy tube placement for nutritional support .Risks and benefits discussed with the  patient/wife including, but not limited to the need for a barium enema during the procedure, bleeding, infection, peritonitis, or damage to adjacent structures. All of the patient's questions were answered, patient is agreeable to proceed. Consent signed and in chart.     Signed: D. Rowe Robert 04/06/2015, 2:17 PM   I spent a total of 20 minutes face to face in clinical consultation, greater than 50% of which was counseling/coordinating care for percutaneous gastrostomy tube placement

## 2015-04-07 ENCOUNTER — Ambulatory Visit
Admission: RE | Admit: 2015-04-07 | Discharge: 2015-04-07 | Disposition: A | Discharge status: Home / Self Care | Payer: Medicare Other | Source: Ambulatory Visit | Attending: Radiation Oncology | Admitting: Radiation Oncology

## 2015-04-07 ENCOUNTER — Ambulatory Visit: Payer: Medicare Other | Admitting: Nutrition

## 2015-04-07 ENCOUNTER — Other Ambulatory Visit (HOSPITAL_COMMUNITY): Payer: Self-pay | Admitting: Physician Assistant

## 2015-04-07 ENCOUNTER — Other Ambulatory Visit: Payer: Self-pay | Admitting: Radiation Oncology

## 2015-04-07 ENCOUNTER — Ambulatory Visit (HOSPITAL_COMMUNITY)
Admission: RE | Admit: 2015-04-07 | Discharge: 2015-04-07 | Disposition: A | Discharge status: Home / Self Care | Payer: Medicare Other | Source: Ambulatory Visit | Attending: Radiation Oncology | Admitting: Radiation Oncology

## 2015-04-07 ENCOUNTER — Ambulatory Visit (HOSPITAL_COMMUNITY): Payer: Medicare Other

## 2015-04-07 DIAGNOSIS — C320 Malignant neoplasm of glottis: Secondary | ICD-10-CM

## 2015-04-07 DIAGNOSIS — E860 Dehydration: Secondary | ICD-10-CM

## 2015-04-07 DIAGNOSIS — D020 Carcinoma in situ of larynx: Secondary | ICD-10-CM

## 2015-04-07 DIAGNOSIS — Z510 Encounter for antineoplastic radiation therapy: Secondary | ICD-10-CM | POA: Diagnosis not present

## 2015-04-07 MED ORDER — OSMOLITE 1.5 CAL PO LIQD: ORAL | Status: DC

## 2015-04-07 MED ORDER — SODIUM CHLORIDE 0.9 % IV SOLN
INTRAVENOUS | Status: DC
  Administered 2015-04-07: 12:00:00 via INTRAVENOUS

## 2015-04-07 NOTE — Progress Notes (Signed)
Patient presents to nutrition follow up s/p feeding tube placement. Patient unable to consume adequate oral intake. Patient has had 6 pound weight loss in about 5 days. Noted history of chronic kidney disease.  Sodium, potassium, BUN and creatinine are within normal limits.  Nutrition diagnosis: Inadequate oral intake continues.  Estimated nutrition needs: 2000-2200 cal, 90-100 grams protein, 2.2 L fluid.  Intervention: Educated patient to begin Osmolite 1.5, 1 can QID with 120 cc free water before and after bolus feedings. Increase bolus feeding to 1 and 1/2 cans QID on day 5 as tolerated. Flush tube or drink an additional 240 cc free water TID. 6 cans of Osmolite 1.5 provides 2130 cal, 89 g protein, 2766 mL free water.  This is greater than 100% of estimated nutrition needs. Patient educated and given written instructions. Patient demonstrated tube feeding administration. Questions answered and teach back used.  Monitoring, evaluation, goals: Patient will tolerate tube feeding at goal rate to minimize loss of lean body mass.  Will continue to monitor renal function and adjust tube feeding as needed.  Next visit: Wednesday, June 8 after radiation treatment.  **Disclaimer: This note was dictated with voice recognition software. Similar sounding words can inadvertently be transcribed and this note may contain transcription errors which may not have been corrected upon publication of note.**

## 2015-04-07 NOTE — Procedures (Signed)
[facility]  Procedure Note  Victor [name] [id number] DOB: 02/15/1936 DOA: 04/07/2015   Dr. Isidore Moos   Associated Diagnosis: E86.0  Procedure Note: IV started, 1 L NS infused per order, IV discontinued   Condition During Procedure:  Patient stable, denies any discomforts   Condition at Discharge:  Patient ambulatory at discharge.  Wife at side.   Roberto Scales, RN  [facility]

## 2015-04-08 ENCOUNTER — Encounter: Payer: Self-pay | Admitting: *Deleted

## 2015-04-08 ENCOUNTER — Ambulatory Visit: Payer: Medicare Other | Attending: Radiation Oncology

## 2015-04-08 ENCOUNTER — Other Ambulatory Visit (HOSPITAL_COMMUNITY): Payer: Medicare Other

## 2015-04-08 ENCOUNTER — Ambulatory Visit
Admission: RE | Admit: 2015-04-08 | Discharge: 2015-04-08 | Disposition: A | Discharge status: Home / Self Care | Payer: Medicare Other | Source: Ambulatory Visit | Attending: Radiation Oncology | Admitting: Radiation Oncology

## 2015-04-08 ENCOUNTER — Ambulatory Visit (HOSPITAL_COMMUNITY)
Admission: RE | Admit: 2015-04-08 | Discharge: 2015-04-08 | Disposition: A | Discharge status: Home / Self Care | Payer: Medicare Other | Source: Ambulatory Visit | Attending: Radiation Oncology | Admitting: Radiation Oncology

## 2015-04-08 DIAGNOSIS — E860 Dehydration: Secondary | ICD-10-CM | POA: Diagnosis not present

## 2015-04-08 DIAGNOSIS — Z510 Encounter for antineoplastic radiation therapy: Secondary | ICD-10-CM | POA: Diagnosis not present

## 2015-04-08 DIAGNOSIS — R1310 Dysphagia, unspecified: Secondary | ICD-10-CM | POA: Diagnosis not present

## 2015-04-08 MED ORDER — SODIUM CHLORIDE 0.9 % IV SOLN
INTRAVENOUS | Status: DC
  Administered 2015-04-08: 14:00:00 via INTRAVENOUS

## 2015-04-08 NOTE — Therapy (Signed)
[facility] [address] [place], [place], [address] Phone: [phone]   Fax:  [phone]  Speech Language Pathology Evaluation  Patient Details  Name: Victor [name] MRN: [id number] Date of Birth: 04/29/1936 Referring Provider:  Eppie Gibson, MD  Encounter Date: 04/08/2015      End of Session - 04/08/15 1449    Visit Number 1   Number of Visits 3   Date for SLP Re-Evaluation 06/07/15   SLP Start Time 84   SLP Stop Time  12   SLP Time Calculation (min) 43 min   Activity Tolerance Patient tolerated treatment well      Past Medical History  Diagnosis Date  . Thyroid disease   . Arthritis   . Cancer 11/12/14    vocal cord  carcinoma in situ   . BPH (benign prostatic hyperplasia)   . GERD (gastroesophageal reflux disease)   . Atherosclerosis   . H/O carotid atherosclerosis     b/l  . Carotid bruit   . Bulging lumbar disc   . Chronic kidney disease     chronic stage III  . Dysphonia   . Hyperlipidemia   . Spondylosis of lumbosacral region   . Occasional tremors     left hand managed with propranolol  . DDD (degenerative disc disease), lumbar   . Atherosclerotic PVD with intermittent claudication   . Peripheral vascular angioplasty status with implants and grafts   . Dysplasia of true vocal cord   . Sleep apnea     hasn't used CPAP in years    Past Surgical History  Procedure Laterality Date  . Vocal cord biopsy  11/12/14    squamous cell carcinoma in situ  . Appendectomy    . Tonsillectomy    . Mouth surgery      tooth extraction  . Thyroid surgery      There were no vitals filed for this visit.  Visit Diagnosis: Dysphagia      Subjective Assessment - 04/08/15 1032    Subjective Pt with PEG placed 04-06-15 due to weight loss, inability for POs. Now with liquid hydrocodone so throat pain reduced/eliminated.            SLP Evaluation [facility] - 04/08/15 1034    SLP Visit Information   SLP Received On 04/08/15   Medical Diagnosis vocal cord cancer in situ   Pain Assessment   Pain Score 0-No pain   Oral Motor/Sensory Function   Labial ROM Within Functional Limits   Labial Strength Within Functional Limits   Lingual ROM Within Functional Limits   Lingual Symmetry Within Functional Limits   Lingual Strength Within Functional Limits   Lingual Coordination WFL   Facial ROM Within Functional Limits   Facial Symmetry Within Functional Limits   Velum Within Functional Limits   Motor Speech   Phonation Aphonic;Breathy;Hoarse  due to rad tx - folds likely swollen     See above for oral-motor exam. Pt currently tolerates dys I/thin. POs: Pt observed with applesauce and water without overt s/s aspiration today. Pt states he clears his throat continually "I feel phlegm there all the time", pt stated. SLP noticed he cleared throat x4 during eval today. Thyroid elevation appeared WFL/WNL, and swallows appeared timely. Oral residue noted as WNL. Pt's swallow deemed essentially WFL/WNL at this time however he states if he was un-medicated he would have difficulties with pharyngeal clearance due to pain.   Because data states the risk for dysphagia during  and after radiation treatment is high due to undergoing radiation tx, SLP taught pt about the possibility of reduced/limited ability for PO intake during rad tx. SLP encouraged pt to continue swallowing POs as far into rad tx as possible, even ingesting POs and/or completing HEP shortly after administration of pain meds. Pt stated he does this currently, since liquid hydrocodone has been prescribed.  SLP educated pt re: changes to swallowing musculature after rad tx, and why adherence to dysphagia HEP provided today and PO consumption was necessary to inhibit muscular disuse atrophy and to reduce muscle fibrosis following rad tx. Pt demonstrated understanding of these things to SLP.    After eval tasks, SLP then developed a HEP for pt and  pt was instructed how to perform exercises involving lingual, vocal, and pharyngeal strengthening. SLP performed each exercise and pt return demonstrated each exercise. SLP ensured pt performance was correct prior to moving on to next exercise. Pt was instructed to complete this program 2-3 times a day, 6-7 days/week until 60 days after their last rad tx, then x2 a week after that.          SLP Education - May 04, 2015 1448    Education provided Yes   Education Details HEP, muscle fibrosis and atrophy   Person(s) Educated Patient;Spouse   Methods Explanation;Demonstration;Verbal cues;Handout   Comprehension Verbalized understanding;Returned demonstration;Verbal cues required          SLP Short Term Goals - 2015-05-04 1451    SLP SHORT TERM GOAL #1   Title pt will perform HEP with rare verbal cues   Time 1   Period --  visit   Status New   SLP SHORT TERM GOAL #2   Title pt will tell SLP why he completes HEP   Time 1   Period --  visit   Status New          SLP Long Term Goals - 05-04-15 1452    SLP LONG TERM GOAL #1   Title pt will perform HEP with independence   Time 2   Period --  vistis   Status New   SLP LONG TERM GOAL #2   Title pt will tell SLP 3 overt s/s aspiration PNA   Time 2   Period --  visits   Status New   SLP LONG TERM GOAL #3   Title pt will tell SLP how a food journal can assist return to full PO diet   Time 2   Period --  visits   Status New          Plan - 05/04/15 1450    Clinical Impression Statement Pt presents with swallowing essentially WNL at this time. Skilled ST remains needed to assess success with HEP and with POs.   Speech Therapy Frequency --  approx once every four weeks   Duration --  for 60 days   Treatment/Interventions Aspiration precaution training;Compensatory strategies;SLP instruction and feedback;Oral motor exercises;Pharyngeal strengthening exercises;Trials of upgraded texture/liquids  HEP   Potential to Achieve  Goals Good   SLP Home Exercise Plan provided 05-04-2015   Consulted and Agree with Plan of Care Patient;Family member/caregiver   Family Member Consulted wife          G-Codes - 2015/05/04 1455    Functional Assessment Tool Used noms   Functional Limitations Swallowing   Swallow Current Status [phone]) At least 1 percent but less than 20 percent impaired, limited or restricted   Swallow Goal Status [id number]) At least  1 percent but less than 20 percent impaired, limited or restricted      Problem List Patient Active Problem List   Diagnosis Date Noted  . 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    [facility] , MS, CCC-SLP  04/08/2015, 2:56 PM  [facility] [address] [place], [place], [address] Phone: [phone]   Fax:  [phone]

## 2015-04-08 NOTE — Patient Instructions (Signed)
SWALLOWING EXERCISES Do these 6 of the 7 days per week until 6 months after your last day of radiation, then 2 times per week afterwards  1. Effortful Swallows - Squeeze hard with the muscles in your neck while you swallow your  saliva or a sip of water - Repeat 20 times, 2-3 times a day, and use whenever you eat or drink  2. Masako Swallow - swallow with your tongue sticking out - Stick tongue out past your teeth and gently bite tongue with your teeth - Swallow, while holding your tongue with your teeth - Repeat 20 times, 2-3 times a day *use a wet spoon if your mouth gets dry*  3. Shaker Exercise - head lift - Lie flat on your back in your bed or on a couch without pillows - Raise your head and look at your feet - KEEP YOUR SHOULDERS DOWN - HOLD FOR 45-60 SECONDS, then lower your head back down - Repeat 3 times, 2-3 times a day  4. Mendelsohn Maneuver - "half swallow" exercise - Start to swallow, and keep your Adam's apple up by squeezing hard with the muscles of the throat - Hold the squeeze for 5-7 seconds and then relax - Repeat 20 times, 2-3 times a day *use a wet spoon if your mouth gets dry*  5. Breath Hold - Say "HUH!" loudly, then hold your breath for 3 seconds at your voice box - Repeat 20 times, 2-3 times a day  6. Chin pushback - Open your mouth  - Place your fist UNDER your chin near your neck, and push back with your fist for 5 seconds - Repeat 10 times, 2-3 times a day

## 2015-04-08 NOTE — Procedures (Signed)
[facility]  Procedure Note  Victor [name] [id number] DOB: 1936-06-11 DOA: 04/08/2015   PCP: Junie Panning, NP   Associated Diagnosis: Dehydration (276.51)  Procedure Note:  IV infusion of normal saline   Condition During Procedure:  Pt tolerated well   Condition at Discharge:  No complications noted   Nigel Sloop, [facility]

## 2015-04-08 NOTE — Progress Notes (Signed)
Oncology Nurse Navigator Documentation  Oncology Nurse Navigator Flowsheets 04/07/2015  Navigator Encounter Type Other;Education  Received call from Dory Peru, RD, who was seeing patient in her office.  His wife was with him.  Patient reported to [facility] that home health RN had difficulty with PEG gravity flush yesterday, i.e fluid leaking from tube when unclamped, could accomplish gravity flush only if patient in prone position.  Barb asked me to evaluate.  Initial assessment indicated tube could not be fully extended above insertion site d/t manner in which taped.  Fluid freely moving when tube unclamped, tube patent when flushed with syringe.  Upon removal of dsg, tube partially coiled.  Upon full extension, water and nutritional supplement administered via gravity without difficulty.  Patient accurately demonstrated gravity administration.  PEG insertion site clean, without drainage, no signs of redness.  I instructed patient on site cleaning and dsg application.  Patient indicated [facility] had not provided syringe or other supplies.  In addition to several syringes provided by [facility], I provided him multiple drain sponges, [id number], applicators, bottle of saline,  Medipore tape, 2 pair mesh brief (one of which was cut to show how used to secure PEG).  I encouraged him to contact me with further questions/concerns.  Patient Visit Type -  Treatment Phase -  Barriers/Navigation Needs -  Time Spent with Patient [place], RN, BSN, [place] at [place] [phone]

## 2015-04-08 NOTE — Progress Notes (Signed)
Oncology Nurse Navigator Documentation  Oncology Nurse Navigator Flowsheets 04/05/2015 04/07/2015 04/08/2015  Navigator Encounter Type Treatment;Other Other;Education Other;Education  Patient asked to see me after RT.   1. I answered his questions about PEG care.  He reported successfull self-administration of  free water and supplement by gravity.   2. I answered his questions about pain medication regulation after tmt is completed. 3. He reported successful appt with Garald Balding, SLP, this morning.  He verbalized understanding of the importance of swallowing exercises presented. 4. He stated overall he is feeling better than he has in the last couple of weeks.   Patient Visit Type Radonc - -  Treatment Phase - - -  Barriers/Navigation Needs - - -  Time Spent with Patient 30 45 30   Gayleen Orem, Therapist, sports, Copywriter, advertising, [place] at [place] [phone]

## 2015-04-11 ENCOUNTER — Ambulatory Visit
Admission: RE | Admit: 2015-04-11 | Discharge: 2015-04-11 | Disposition: A | Discharge status: Home / Self Care | Payer: Medicare Other | Source: Ambulatory Visit | Attending: Radiation Oncology | Admitting: Radiation Oncology

## 2015-04-11 ENCOUNTER — Encounter: Payer: Self-pay | Admitting: Radiation Oncology

## 2015-04-11 VITALS — BP 133/76 | HR 69 | Temp 98.1°F | Resp 20 | Wt 204.2 lb

## 2015-04-11 DIAGNOSIS — Z510 Encounter for antineoplastic radiation therapy: Secondary | ICD-10-CM | POA: Diagnosis not present

## 2015-04-11 DIAGNOSIS — D020 Carcinoma in situ of larynx: Secondary | ICD-10-CM

## 2015-04-11 MED ORDER — FENTANYL 25 MCG/HR TD PT72: 25.0000 ug | MEDICATED_PATCH | TRANSDERMAL | Status: DC

## 2015-04-11 MED ORDER — FENTANYL 37.5 MCG/HR TD PT72: 37.5000 ug | MEDICATED_PATCH | TRANSDERMAL | Status: DC

## 2015-04-11 MED ORDER — HYDROCODONE-ACETAMINOPHEN 7.5-325 MG/15ML PO SOLN: 10.0000 mL | ORAL | Status: DC | PRN

## 2015-04-11 NOTE — Progress Notes (Addendum)
Weekly rad txs larynx 22/28 completed, no taking pills as yet difficuklty swallowing, has a peg tub and taking 5 cans via peg during the day, with free water before and after feedings, dry mouth, using salt water/ baking sodarinses, states thick saliva in the mornings, wears fentanyl patches, needs refill, didn't sleep well last night, neck reddened using biafine biid, fatigued 2:18 PM' BP 133/76 mmHg  Pulse 69  Temp(Src) 98.1 F (36.7 C) (Oral)  Resp 20  Wt 204 lb 3.2 oz (92.625 kg)  SpO2 96%  Wt Readings from Last 3 Encounters:  04/06/15 202 lb (91.627 kg)  04/01/15 203 lb 12.8 oz (92.443 kg)  03/14/15 213 lb 3.2 oz (96.707 kg)

## 2015-04-11 NOTE — Progress Notes (Signed)
   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  CBCT/MVCT images/Port film x-rays were reviewed.  The chart was checked.  PEG has been placed, tolerating this well. Weight stable.  Still has a lot of pain esp with swallowing.  Wakes up at night often, unsure if this is due to apnea.  Physical Findings:  weight is 204 lb 3.2 oz (92.625 kg). His oral temperature is 98.1 F (36.7 C). His blood pressure is 133/76 and his pulse is 69. His respiration is 20 and oxygen saturation is 96%.   Wt Readings from Last 3 Encounters:  04/06/15 202 lb (91.627 kg)  04/01/15 203 lb 12.8 oz (92.443 kg)  03/14/15 213 lb 3.2 oz (96.707 kg)  Skin erythematous, oropharynx clear  Impression:  The patient is tolerating radiotherapy with difficulty.  Plan:  We had a lengthy visit today.  Continue radiotherapy as planned.    Decrease fentanyl by 50% to 25 mcg patches and take only 10 mL of hycet at a time due to possible respiratory depression by pt history.  Pt may also stop fentanyl and take hycet alone (15 mL/dose q 4 hrs prn) if this results in tolerable pain control. Trying to balance pain control (which is an issue) with questionable symptoms of apnea at night. Use lidocaine, sucralfate as Rx/ as helpful. He is appreciative of this plan.  __________________________   Eppie Gibson, M.D.

## 2015-04-12 ENCOUNTER — Ambulatory Visit
Admission: RE | Admit: 2015-04-12 | Discharge: 2015-04-12 | Disposition: A | Discharge status: Home / Self Care | Payer: Medicare Other | Source: Ambulatory Visit | Attending: Radiation Oncology | Admitting: Radiation Oncology

## 2015-04-12 DIAGNOSIS — Z510 Encounter for antineoplastic radiation therapy: Secondary | ICD-10-CM | POA: Diagnosis not present

## 2015-04-13 ENCOUNTER — Ambulatory Visit (HOSPITAL_COMMUNITY): Payer: Medicare Other

## 2015-04-13 ENCOUNTER — Ambulatory Visit: Payer: Medicare Other | Admitting: Nutrition

## 2015-04-13 ENCOUNTER — Ambulatory Visit
Admission: RE | Admit: 2015-04-13 | Discharge: 2015-04-13 | Disposition: A | Discharge status: Home / Self Care | Payer: Medicare Other | Source: Ambulatory Visit | Attending: Radiation Oncology | Admitting: Radiation Oncology

## 2015-04-13 ENCOUNTER — Ambulatory Visit (HOSPITAL_COMMUNITY): Admission: RE | Admit: 2015-04-13 | Payer: Medicare Other | Source: Ambulatory Visit

## 2015-04-13 ENCOUNTER — Other Ambulatory Visit: Payer: Self-pay | Admitting: Radiation Oncology

## 2015-04-13 DIAGNOSIS — D020 Carcinoma in situ of larynx: Secondary | ICD-10-CM

## 2015-04-13 DIAGNOSIS — Z510 Encounter for antineoplastic radiation therapy: Secondary | ICD-10-CM | POA: Diagnosis not present

## 2015-04-13 NOTE — Progress Notes (Signed)
Patient presents to nutrition follow-up after radiation treatment. Patient is tolerating bolus tube feedings via PEG utilizing Osmolite 1.5. Patient is trying to infuse 5 cans via feeding tube and drink one Ensure Plus a day by mouth. Patient also trying to be compliant with increased fluid intake. Patient verbalizes desire to increase oral intake. He is anxious to have feeding tube removed. Weight documented as 204.2 pounds on June 6.  Nutrition diagnosis: Inadequate oral intake continues.  Intervention: Patient educated to continue Osmolite 1.5 with a goal rate of 6 cans daily if unable to take anything by mouth. Educated patient he could try to drink Ensure Plus in place of Osmolite 1.5 as tolerated. Educated patient on strategies for timing feedings so that he does not have adverse effects with radiation therapy. Questions were answered.  Teach back method was used.  Monitoring, evaluation, goals: Patient will tolerate tube feedings plus oral intake to minimize weight loss.  Next visit: Tuesday, June 14 after radiation therapy  **Disclaimer: This note was dictated with voice recognition software. Similar sounding words can inadvertently be transcribed and this note may contain transcription errors which may not have been corrected upon publication of note.**

## 2015-04-14 ENCOUNTER — Ambulatory Visit
Admission: RE | Admit: 2015-04-14 | Discharge: 2015-04-14 | Disposition: A | Discharge status: Home / Self Care | Payer: Medicare Other | Source: Ambulatory Visit | Attending: Radiation Oncology | Admitting: Radiation Oncology

## 2015-04-14 DIAGNOSIS — Z510 Encounter for antineoplastic radiation therapy: Secondary | ICD-10-CM | POA: Diagnosis not present

## 2015-04-15 ENCOUNTER — Ambulatory Visit
Admission: RE | Admit: 2015-04-15 | Discharge: 2015-04-15 | Disposition: A | Discharge status: Home / Self Care | Payer: Medicare Other | Source: Ambulatory Visit | Attending: Radiation Oncology | Admitting: Radiation Oncology

## 2015-04-15 DIAGNOSIS — Z510 Encounter for antineoplastic radiation therapy: Secondary | ICD-10-CM | POA: Diagnosis not present

## 2015-04-18 ENCOUNTER — Ambulatory Visit
Admission: RE | Admit: 2015-04-18 | Discharge: 2015-04-18 | Disposition: A | Discharge status: Home / Self Care | Payer: Medicare Other | Source: Ambulatory Visit | Attending: Radiation Oncology | Admitting: Radiation Oncology

## 2015-04-18 VITALS — BP 119/50 | HR 85 | Temp 98.0°F | Resp 12 | Wt 205.4 lb

## 2015-04-18 DIAGNOSIS — Z931 Gastrostomy status: Secondary | ICD-10-CM | POA: Insufficient documentation

## 2015-04-18 DIAGNOSIS — D020 Carcinoma in situ of larynx: Secondary | ICD-10-CM

## 2015-04-18 DIAGNOSIS — R1310 Dysphagia, unspecified: Secondary | ICD-10-CM | POA: Insufficient documentation

## 2015-04-18 DIAGNOSIS — L599 Disorder of the skin and subcutaneous tissue related to radiation, unspecified: Secondary | ICD-10-CM | POA: Insufficient documentation

## 2015-04-18 DIAGNOSIS — Z510 Encounter for antineoplastic radiation therapy: Secondary | ICD-10-CM | POA: Diagnosis not present

## 2015-04-18 MED ORDER — FENTANYL 25 MCG/HR TD PT72: 25.0000 ug | MEDICATED_PATCH | TRANSDERMAL | Status: DC

## 2015-04-18 MED ORDER — HYDROCODONE-ACETAMINOPHEN 7.5-325 MG/15ML PO SOLN: 10.0000 mL | ORAL | Status: DC | PRN

## 2015-04-18 MED ORDER — BIAFINE EX EMUL
Freq: Two times a day (BID) | CUTANEOUS | Status: DC
  Administered 2015-04-18: 16:00:00 via TOPICAL

## 2015-04-18 MED ORDER — FENTANYL 12 MCG/HR TD PT72: MEDICATED_PATCH | TRANSDERMAL | Status: DC

## 2015-04-18 NOTE — Progress Notes (Signed)
   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  CBCT/MVCT images/Port film x-rays were reviewed.  The chart was checked.  PEG has been placed, tolerating this well. Weight stable.   PAIN:  He rates his pain as a 4 on a scale of 0-10. intermittent and "sore throat" over throat.  SWALLOWING/DIET:  Pt has had dysphagia for both solids and liquids. Pt reports a small meals by mouth remaining by tube, formula-Osmolite, 4-6 cans per day, 20ml H20 flush before and after, plus 48 oz additional.  Reports thick ropey white sputum in morning  BOWEL:  Pt reports soft bowel movements everyday.  SKIN:  Skin exam reveals Hyperpigmentation and erythema. Pt continues to apply Biafine as directed.  OTHER:  Pt complains of fatigue, loss of sleep and poor appetite. His wife mentions his breathing is better while he is sleeping than previously.   Physical Findings:  vitals were not taken for this visit.  Wt Readings from Last 3 Encounters:  04/11/15 204 lb 3.2 oz (92.625 kg)  04/06/15 202 lb (91.627 kg)  04/01/15 203 lb 12.8 oz (92.443 kg)  Oropharynx is clear. Anterior neck has patch of hyperpigmentation.   Impression:  The patient is tolerating radiotherapy    Plan: Continue radiotherapy as planned.  Discussed decreasing pain medications as the throat pain becomes less severe. Prescribe one 25 mcg fentanyl patch and transition to 12.5 mcg fentanyl patches during healing. Prescribe hydrocodone refill. Follow up in 1 month.  This document serves as a record of services personally performed by Eppie Gibson, MD. It was created on her behalf by Arlyce Harman, a trained medical scribe. The creation of this record is based on the scribe's personal observations and the provider's statements to them. This document has been checked and approved by the attending provider.  __________________________   Eppie Gibson, M.D.

## 2015-04-18 NOTE — Addendum Note (Signed)
Encounter addended by: Jenene Slicker, RN on: 04/18/2015  3:35 PM<BR>     Documentation filed: [employer], Inpatient MAR, Orders

## 2015-04-18 NOTE — Progress Notes (Addendum)
PAIN: He rates his pain as a 4 on a scale of 0-10. intermittent and "sore throat" over throat  SWALLOWING/DIET: Pt has had dysphagia for both solids and liquids. Pt reports a small meals by mouth remaining by tube,  formula-Osmolite, 4-6 cans per day, 43ml H20 flush before and after, plus 48 oz additional. Cleansed g-tube site with NS dressing with split 4x4 and secured with tape.  Pt education regarding g tube care. Peg site clean with a small amount of pink drainage. Oral exam reveals mucous membranes moist with clear and thin sputum. Reports thick ropey white sputum in morning BOWEL: Pt reports soft bowel movements everyday. SKIN: Skin exam reveals Hyperpigmentation and erythema. Pt continues to apply Biafine as directed. OTHER: Pt complains of fatigue, loss of sleep and poor appetite.  WEIGHT/VS: Wt Readings from Last 3 Encounters:  04/18/15 205 lb 6.4 oz (93.169 kg)  04/11/15 204 lb 3.2 oz (92.625 kg)  04/06/15 202 lb (91.627 kg)   BP 119/50 mmHg  Pulse 85  Temp(Src) 98 F (36.7 C) (Oral)  Resp 12  Wt 205 lb 6.4 oz (93.169 kg)  SpO2 96% Orthostatic Standing Vital Signs: BP: 119/50 P:85 Pox:96%

## 2015-04-19 ENCOUNTER — Encounter: Payer: Self-pay | Admitting: *Deleted

## 2015-04-19 ENCOUNTER — Encounter: Payer: Self-pay | Admitting: Radiation Oncology

## 2015-04-19 ENCOUNTER — Ambulatory Visit
Admission: RE | Admit: 2015-04-19 | Discharge: 2015-04-19 | Disposition: A | Discharge status: Home / Self Care | Payer: Medicare Other | Source: Ambulatory Visit | Attending: Radiation Oncology | Admitting: Radiation Oncology

## 2015-04-19 ENCOUNTER — Telehealth: Payer: Self-pay | Admitting: *Deleted

## 2015-04-19 ENCOUNTER — Ambulatory Visit: Payer: Medicare Other | Admitting: Nutrition

## 2015-04-19 DIAGNOSIS — Z510 Encounter for antineoplastic radiation therapy: Secondary | ICD-10-CM | POA: Diagnosis not present

## 2015-04-19 NOTE — Telephone Encounter (Signed)
Monica from [facility] patient pharmacy called about RX on patient's fentanyl 58mcg "We cannot open a box of 5 fentanyl patches and give patient just 1 patch  As written by Dr. Isidore Moos, patient still has some 32mcg patches left ,they will fill  the 12.26mcg box of fentanyl patch for the patient,", let Brayton Layman know that Dr. Isidore Moos is out of the office today she is in [place] today, and will be out rest of the week, but per her note she was going to transition patient to the 12.16mcg patch, I will in basket MD, "if she wants to change anything let us know,Monica",  2:43 PM

## 2015-04-19 NOTE — Telephone Encounter (Signed)
Called patient and wife per Dr. Isidore Moos  To let them know if he runs out of his 25 mcg patches and feels he isn't ready to taper to 12.66mcg  Yet, he can put two 12.25mcg patches on at one time= equivalent to 25 mcg patch,. If  they need refills in the next couple weeks, to just call us at [phone] and let Dr. Isidore Moos  Know, wife gave verbal understanding,repeated back ,"he has 3 of the 25 mcg patches left at this time', they have the 12.59mcg fentanyl patch box already picked up at the pharmacy,       3:02 PM Thanked this Rn for calling

## 2015-04-19 NOTE — Progress Notes (Signed)
Nutrition follow-up completed with patient on his final day of radiation treatment for cancer of the vocal cord. Patient's weight is stable and documented as 205.4 pounds June 13. Patient is tolerating approximately 3-4 cans of Osmolite 1.5 via PEG. Patient is trying to drink Ensure Plus by mouth as well as other soft foods and liquids. Patient remains anxious to have feeding tube removed.  Nutrition diagnosis: Inadequate oral intake continues but has improved.  Intervention:  Educated patient on the importance of continuing Osmolite 1.5 via feeding tube as well as Ensure Plus by mouth to meet minimum estimated nutrition needs to support healing. Provided education on foods patient could begin to incorporate as his throat heals. Provided support and encouragement. Questions were answered and teach back method used.  Monitoring, evaluation, goals: Patient will tolerate adequate calories and protein to promote healing.  Next visit: I will follow-up with patient by telephone.  **Disclaimer: This note was dictated with voice recognition software. Similar sounding words can inadvertently be transcribed and this note may contain transcription errors which may not have been corrected upon publication of note.**

## 2015-04-19 NOTE — Progress Notes (Signed)
Oncology Nurse Navigator Documentation  Oncology Nurse Navigator Flowsheets 04/19/2015  Navigator Encounter Type Treatment  Patient Visit Type Radonc  Treatment Phase Final Radiation Tx  Met with pt during final RT to offer support and to celebrate end of radiation treatment.  He was accompanied by his wife. 1. I provided wife with a Certificate of Recognition. 2. I provided post-RT guidance:  Importance of keeping follow-up appts with Nutrition and SLP.  Patient noted his throat is too sore to presently to do swallowing exercises, I encouraged him to start again in the next week or so after he has had time to heal from RT.  Importance of protecting treatment area from sun.  Continuation of Biafine application 2-3 times daily. 3. I explained that my role as navigator will continue for several more months and that I will be calling and/or joining him during follow-up visits.   4. I encouraged him to call me with needs/concerns.   Patient and wife verbalized understanding of information provided.    Barriers/Navigation Needs -  Time Spent with Patient [place], RN, BSN, [place] at [place] [phone]

## 2015-04-28 ENCOUNTER — Telehealth: Payer: Self-pay | Admitting: *Deleted

## 2015-04-28 NOTE — Telephone Encounter (Signed)
Oncology Nurse Navigator Documentation  Oncology Nurse Navigator Flowsheets 04/28/2015  Navigator Encounter Type Telephone;6 month  Called patient to check on his well-being s/p last week's final RT. He reported:  Increased voice hoarseness, throat soreness.  We discussed that these are expected SEs at this point after tmt, improvement can be expected by the end of next week.  Throat pain is well controlled with 25 mcg fentanyl and PRN HYCET (taking 4 times daily, before meals and bedtime).  Tapering to 12.5 mcg fentanyl starting tomorrow.    Eating soft foods (e.g. eggs), drinking at least 1 Ensure, milkshake, protein shake daily.  Instilling at least 4 cans nutritional supplement daily, water via PEG. He did not express any needs or concerns at this time, I encouraged him to contact me if that changes, he verbalized understanding.   Patient Visit Type -  Treatment Phase -  Barriers/Navigation Needs -  Time Spent with Patient Emma, RN, BSN, [place] at [place] [phone]

## 2015-05-02 NOTE — Progress Notes (Signed)
  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  He developed significant odynophagia which was treated with narcotics.  His Duragesic was tapered down due to concerns of possible early respiratory depression.  He ultimately opted for a PEG tube for nutrition supplementation and tolerated this well.  Plan: The patient has completed radiation treatment. The patient will return to radiation oncology clinic for routine followup in one month. I advised them to call or return sooner if they have any questions or concerns related to their recovery or treatment.  -----------------------------------  Eppie Gibson, MD

## 2015-05-11 ENCOUNTER — Telehealth: Payer: Self-pay | Admitting: *Deleted

## 2015-05-11 ENCOUNTER — Ambulatory Visit: Payer: Medicare Other | Attending: Radiation Oncology

## 2015-05-11 DIAGNOSIS — R1310 Dysphagia, unspecified: Secondary | ICD-10-CM

## 2015-05-11 NOTE — Patient Instructions (Signed)
Clear your throat intermittently during your meals  Complete the exercises as prescribed, at elast twice a day  Eat eat eat! Continue your spreadsheet with your weight measurements and bring to next appointment with Dr. Isidore Moos

## 2015-05-11 NOTE — Telephone Encounter (Addendum)
Oncology Nurse Navigator Documentation  Oncology Nurse Navigator Flowsheets 05/11/2015  Navigator Encounter Type Telephone  Returned patient's 12:58 VM in which he stated he needs to change appt time.  He stated he needs to reschedule 05/27/15 appt with Dr. Isidore Moos as he will be in [place].  He noted he will be on vacation 8/5-10/16.  I indicated I will get back to him with optional appts.   He further reported:  PEG only being used for daily flush and 1 can Osmolite.  Otherwise, he is eating and drinking.  Minimal fluctuation in weight.  Denies throat pain/soreness for the past 5-6 days, has not taken PRN Hycet, has been off fentanyl patch.   Patient Visit Type -  Treatment Phase -  Barriers/Navigation Needs -  Time Spent with Patient [place], RN, BSN, [place] at [place] [phone]

## 2015-05-11 NOTE — Therapy (Signed)
[facility] [address] [place], [place], [address] Phone: [phone]   Fax:  [phone]  Speech Language Pathology Treatment  Patient Details  Name: Victor [name] MRN: [id number] Date of Birth: 06-22-1936 Referring Provider:  Junie Panning, NP  Encounter Date: 05/11/2015      End of Session - 05/11/15 1559    Visit Number 2   Number of Visits 3   Date for SLP Re-Evaluation 06/07/15   Authorization Type pt will need recert if next visit after 06-07-15   SLP Start Time 1020   SLP Stop Time  1100   SLP Time Calculation (min) 40 min   Activity Tolerance Patient tolerated treatment well      Past Medical History  Diagnosis Date  . Thyroid disease   . Arthritis   . Cancer 11/12/14    vocal cord  carcinoma in situ   . BPH (benign prostatic hyperplasia)   . GERD (gastroesophageal reflux disease)   . Atherosclerosis   . H/O carotid atherosclerosis     b/l  . Carotid bruit   . Bulging lumbar disc   . Chronic kidney disease     chronic stage III  . Dysphonia   . Hyperlipidemia   . Spondylosis of lumbosacral region   . Occasional tremors     left hand managed with propranolol  . DDD (degenerative disc disease), lumbar   . Atherosclerotic PVD with intermittent claudication   . Peripheral vascular angioplasty status with implants and grafts   . Dysplasia of true vocal cord   . Sleep apnea     hasn't used CPAP in years    Past Surgical History  Procedure Laterality Date  . Vocal cord biopsy  11/12/14    squamous cell carcinoma in situ  . Appendectomy    . Tonsillectomy    . Mouth surgery      tooth extraction  . Thyroid surgery      There were no vitals filed for this visit.  Visit Diagnosis: Dysphagia      Subjective Assessment - 05/11/15 1028    Subjective Last rad tx 04-19-15. Shrimp last night with baked bean and cottage cheese.                ADULT SLP TREATMENT - 05/11/15 1058     General Information   Behavior/Cognition Alert;Cooperative;Pleasant mood   Treatment Provided   Treatment provided Dysphagia   Dysphagia Treatment   Temperature Spikes Noted No   Respiratory Status Room air   Treatment Methods Skilled observation;Therapeutic exercise;Compensation strategy training;Patient/caregiver education   Patient observed directly with PO's Yes   Type of PO's observed Dysphagia 3 (soft);Thin liquids   Liquids provided via Cup   Pharyngeal Phase Signs & Symptoms Delayed throat clear;Wet vocal quality  mild hydrophonic voice, intermittent   Other treatment/comments Pt with cereal bar and water today with SLP skilled observation ID'd throat clear on 1/4 solid boluses and mild hydrophonic voice on 1/4 boluses of solids and 2/6 boluses thin. SLP educated pt on laryngeal penetration and hydrophonic voice and rec pt clear throat/reswallow intermittently throughout his meal.  Pt req'd rare min verbal cues with HEP. Pt attended tx with his wife. SLP encouraged pt towards POs as much as possible as pt stated he was ingesting only 1-2 cans TF currently.    Assessment / Recommendations / Plan   Plan Continue with current plan of care   Dysphagia Recommendations   Diet recommendations --  as tolerated   Liquids provided via Cup   Compensations Clear throat intermittently   Progression Toward Goals   Progression toward goals Progressing toward goals          SLP Education - 05/11/15 1559    Education provided Yes   Education Details muscle fibrosis, atrophy, HEP   Person(s) Educated Patient;Spouse   Methods Explanation;Demonstration;Verbal cues   Comprehension Verbalized understanding;Returned demonstration;Verbal cues required          SLP Short Term Goals - 05/11/15 1602    SLP SHORT TERM GOAL #1   Title pt will perform HEP with rare verbal cues   Time --   Period --   Status Achieved   SLP SHORT TERM GOAL #2   Title pt will tell SLP why he completes HEP    Time 1   Period --  visit   Status Partially Met  continue goal (05-11-15)          SLP Long Term Goals - 05/11/15 1603    SLP LONG TERM GOAL #1   Title pt will perform HEP with independence   Time 1   Period --  vistis   Status On-going   SLP LONG TERM GOAL #2   Title pt will tell SLP 3 overt s/s aspiration PNA   Time 1   Period --  visits   Status On-going   SLP LONG TERM GOAL #3   Title pt will tell SLP how a food journal can assist return to full PO diet   Time 1   Period --  visits   Status On-going          Plan - 05/11/15 1600    Clinical Impression Statement Pt is able to swallow with minimal difficulty at this time. SLP encouraged pt to cont with HEP, but adhere to frequency as prescribed. SLP suggested to pt to do intermittent throat clear throughout his meals. Skilled ST remains needed to assess safety with POs as well as success with HEP procedure (pt required cues today).   Speech Therapy Frequency --  approx every four weeks   Duration --  for 60 days   Treatment/Interventions Aspiration precaution training;Compensatory strategies;SLP instruction and feedback;Oral motor exercises;Pharyngeal strengthening exercises;Trials of upgraded texture/liquids   Potential to Achieve Goals Good        Problem List Patient Active Problem List   Diagnosis Date Noted  . 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    [facility] , [place], CCC-SLP  05/11/2015, 4:04 PM  [facility] [address] [place], [place], [address] Phone: [phone]   Fax:  [phone]

## 2015-05-12 ENCOUNTER — Ambulatory Visit: Payer: Medicare Other | Admitting: Nutrition

## 2015-05-12 NOTE — Progress Notes (Signed)
Patient reports he is eating anything he wants by mouth. States he just finished a big breakfast at [employer]. Patient wants to know if he can drink Ensure Plus in place of using Osmolite 1.5 via feeding tube. He keeps records of his oral intake and reports his weight is within 5 pounds of when he finished treatment. Educated patient to replace one can of Ensure Plus for one can of Osmolite 1.5. Patient educated to continue to flush tube once a day. Patient will monitor weight. Will contact me for questions.

## 2015-05-13 ENCOUNTER — Telehealth: Payer: Self-pay | Admitting: *Deleted

## 2015-05-14 NOTE — Telephone Encounter (Signed)
Oncology Nurse Navigator Documentation  Oncology Nurse Navigator Flowsheets 05/13/2015  Navigator Encounter Type Telephone  Called patient to inform of 06/08/15 1120 re-scheduled follow-up appt with Dr. Isidore Moos.  He stated he is planning to leave for vacation that morning though he can delay until after appt.  He requested appt the week of 7/25 so he can be assessed for PEG removal which he would like before he leaves for vacation.  I explained that she is on vacation that week, that I will check to see he can be seen by Dr. Valere Dross.   Gayleen Orem, RN, BSN, [place] at [place] [phone]

## 2015-05-16 ENCOUNTER — Telehealth: Payer: Self-pay | Admitting: *Deleted

## 2015-05-16 ENCOUNTER — Other Ambulatory Visit: Payer: Self-pay | Admitting: Radiation Oncology

## 2015-05-16 DIAGNOSIS — D020 Carcinoma in situ of larynx: Secondary | ICD-10-CM

## 2015-05-16 NOTE — Telephone Encounter (Signed)
  Oncology Nurse Navigator Documentation   Navigator Encounter Type: Telephone (05/16/15 1250)      Patient called to indicate he wants 7/22 1440 appt with Dr. Isidore Moos.  He confirmed he has been notified of 7/27 1400 appt for PEG removal.   Gayleen Orem, RN, BSN, [place] at [place] [phone]

## 2015-05-16 NOTE — Telephone Encounter (Signed)
  Oncology Nurse Navigator Documentation   Navigator Encounter Type: Telephone (05/16/15 1236)        Patient called to inform he has been contacted for PEG removal on 7/27.   He stated he has re-thought cancelled appt with Dr. Isidore Moos originally for 7/22 and asked if still available but not to cancel other appts until he checks with his wife.  I checked with Santiago Glad, re-scheduled him for 7/22 @ 1440, notified patient.  I indicated I need to know ASAP today which appt he wants as arrangements are being made to accommodate his previous preferences.         Gayleen Orem, RN, BSN, [place] at [place] [phone]

## 2015-05-23 ENCOUNTER — Telehealth: Payer: Self-pay | Admitting: *Deleted

## 2015-05-23 NOTE — Telephone Encounter (Signed)
  Oncology Nurse Navigator Documentation   Navigator Encounter Type: Telephone (05/23/15 1207)    Patient called from [place], [place], where he is attending a family function.  He has developed an abscessed tooth for which he is taking abx.  He has the opportunity to have the tooth extracted, asks if he should wait until he returns to [facility] and arrange to have extraction by [place].  Requested that Dr. Isidore Moos be notified for her guidance.   Gayleen Orem, RN, BSN, [place] at [place] [phone]

## 2015-05-24 ENCOUNTER — Telehealth: Payer: Self-pay | Admitting: *Deleted

## 2015-05-24 NOTE — Telephone Encounter (Signed)
  Oncology Nurse Navigator Documentation   Navigator Encounter Type: Telephone (05/24/15 1206)   Called patient, informed him that Dr. Isidore Moos said OK to have abscessed tooth extracted, that tooth roots were not exposed during tmt.  He stated he is going to have tooth extracted when he returns to [place].  Gayleen Orem, RN, BSN, [place] at [place] [phone]

## 2015-05-25 NOTE — Progress Notes (Signed)
Pain Status: has pain in his left lower back from a long car trip.  It started a couple of days ago.  Denies pain with swallowing.  Nutritional Status a) intake: eating a normal diet b) using a feeding tube?: yes.  Will be removed 06/01/15 c) weight changes, if any:  Wt Readings from Last 3 Encounters:  04/18/15 205 lb 6.4 oz (93.169 kg)  04/11/15 204 lb 3.2 oz (92.625 kg)  04/06/15 202 lb (91.627 kg)    Swallowing Status: Pt denies swallowing issues.  Smoking or chewing tobacco? no  Dental (if applicable): When was last visit with dentistry? Saw a dentist in [place] on Monday due to an abscess on one of his lower right molars. Using fluoride trays daily? no   When was last ENT visit? Before radiation  When is next ENT visit? unknown  Summary of last medical oncology visit (if applicable) is n/a.   Next med/onc visit is on n/a.  Imaging done in the last month (if applicable) revealed: none  Other notable issues, if any: Patient reports he has a cold that started yesterday with a runny nose.  He was given Atrovent nasal spray by Precious Haws at [facility].  He just returned from a trip to [place].  The skin on his neck is intact.  He reports having fatigue.  BP 133/63 mmHg  Pulse 79  Temp(Src) 98.5 F (36.9 C) (Oral)  Resp 16  Ht 5\' 10"  (1.778 m)  Wt 204 lb 1.6 oz (92.579 kg)  BMI 29.29 kg/m2  SpO2 96%

## 2015-05-27 ENCOUNTER — Encounter: Payer: Self-pay | Admitting: Radiation Oncology

## 2015-05-27 ENCOUNTER — Ambulatory Visit
Admission: RE | Admit: 2015-05-27 | Discharge: 2015-05-27 | Disposition: A | Discharge status: Home / Self Care | Payer: Medicare Other | Source: Ambulatory Visit | Attending: Radiation Oncology | Admitting: Radiation Oncology

## 2015-05-27 ENCOUNTER — Ambulatory Visit: Payer: Medicare Other | Admitting: Radiation Oncology

## 2015-05-27 VITALS — BP 133/63 | HR 79 | Temp 98.5°F | Resp 16 | Ht 70.0 in | Wt 204.1 lb

## 2015-05-27 DIAGNOSIS — D020 Carcinoma in situ of larynx: Secondary | ICD-10-CM

## 2015-05-27 NOTE — Progress Notes (Signed)
Radiation Oncology         [phone] ________________________________  Name: Victor [name] MRN: [id number]  Date: 05/27/2015  DOB: 1936/03/18  Follow-Up Visit Note  CC: Smothers, Andree Elk, NP  Melida Quitter, MD  Diagnosis and Prior Radiotherapy:       ICD-9-CM ICD-10-CM   1. Carcinoma in situ of vocal cord 231.0 D02.0    Carcinoma in situ of the vocal cord  Indication for treatment:  curative       Radiation treatment dates:   03/10/2015-04/19/2015  Site/dose:   Larynx / 63 Gy in 28 fractions  Narrative:  The patient returns today for routine follow-up. Pt states that he has pain in his lower left back from a long car trip and that it started a couple of days ago. Patient reports he has a cold that started yesterday with a runny nose. He was given Atrovent nasal spray by Precious Haws at [facility]. He just returned from a trip to [place]. He also reports fatigue. The pt states that he will have a molar removed due to an abscess. The pt states that he had a thyroidectomy and is taking levothyroxine.  Nutritional Status: a) intake: eating a normal diet  b) using a feeding tube?: yes. Will be removed 06/01/15  c) weight changes, if any:  Wt Readings from Last 3 Encounters:   04/18/15  205 lb 6.4 oz (93.169 kg)   04/11/15  204 lb 3.2 oz (92.625 kg)   04/06/15  202 lb (91.627 kg)     Swallowing Status: Pt denies swallowing issues or pain while swallowing. Smoking or chewing tobacco? no  Dental (if applicable): When was last visit with dentistry? Saw a dentist in [facility] on Monday due to an abscess on one of his lower right molars. Using fluoride trays daily? no  When was last ENT visit? Before radiation When is next ENT visit? unknown     ALLERGIES:  has No Known Allergies.  Meds: Current Outpatient Prescriptions  Medication Sig Dispense Refill  . aspirin 81 MG tablet Take 81 mg by mouth daily.    . diclofenac sodium (VOLTAREN) 1 % GEL Apply 2 g topically as needed (pain).     Marland Kitchen  doxazosin (CARDURA) 2 MG tablet Take 2 mg by mouth daily.    Marland Kitchen FA-B6-B12-D-Omega 3-Phytoster (ANIMI-3/VITAMIN D PO) Take 1 tablet by mouth daily. d3    . HYDROcodone-acetaminophen (HYCET) 7.5-325 mg/15 ml solution Take 10 mLs by mouth every 4 (four) hours as needed for moderate pain. 473 mL 0  . ipratropium (ATROVENT) 0.06 % nasal spray Place into the nose.    . levothyroxine (SYNTHROID, LEVOTHROID) 150 MCG tablet Take 150 mcg by mouth daily before breakfast.    . Liniments (SALONPAS EX) Apply topically as needed. patches    . Loratadine (CLARITIN PO) Take 10 mg by mouth as needed (allergies).     . MEGARED OMEGA-3 KRILL OIL 500 MG CAPS Take 1 tablet by mouth daily.     Marland Kitchen omeprazole (PRILOSEC) 20 MG capsule Take 20 mg by mouth daily.    . propranolol (INDERAL) 80 MG tablet Take 80 mg by mouth daily.     . ranitidine (ZANTAC) 150 MG tablet Take 150 mg by mouth daily.    . simvastatin (ZOCOR) 40 MG tablet Take 40 mg by mouth daily.    . traMADol (ULTRAM) 50 MG tablet Take by mouth every 6 (six) hours as needed for severe pain.     Marland Kitchen emollient (  BIAFINE) cream Apply 1 application topically 2 (two) times daily.    . fentaNYL (DURAGESIC - DOSED MCG/HR) 12 MCG/HR Please 1-2 patches on skin every 72 hours instead of the 42mcg patch, as you taper your pain medication dosage. (Patient not taking: Reported on 05/27/2015) 5 patch 0  . fentaNYL (DURAGESIC - DOSED MCG/HR) 25 MCG/HR patch Place 1 patch (25 mcg total) onto the skin every 3 (three) days. (Patient not taking: Reported on 05/27/2015) 1 patch 0  . fluconazole (DIFLUCAN) 10 MG/ML suspension Take 200 mg (20 mL) today, then 100 mg (10 mL) for 6 more days. Hold simvastatin while on this drug. (Patient not taking: Reported on 05/27/2015) 80 mL 0  . Nutritional Supplements (FEEDING SUPPLEMENT, OSMOLITE 1.5 CAL,) LIQD Begin 1 can of Osmolite 1.5 QID via feeding tube with 120 cc free water before and after each feeding.  Increase to 1.5 cans QID on day 5. Flush  tube or drink an additional 240 cc free water TID between feedings. (Patient not taking: Reported on 05/27/2015) 1422 mL 0  . senna (SENOKOT) 8.6 MG TABS tablet Take 1 tab QHS to prevent constipation while on Duragesic, but stop if you have diarrhea. (Patient not taking: Reported on 04/11/2015) 30 each 2   No current facility-administered medications for this encounter.    Physical Findings: The patient is in no acute distress. Patient is alert and oriented. Wt Readings from Last 3 Encounters:  05/27/15 204 lb 1.6 oz (92.579 kg)  04/18/15 205 lb 6.4 oz (93.169 kg)  04/11/15 204 lb 3.2 oz (92.625 kg)    height is 5\' 10"  (1.778 m) and weight is 204 lb 1.6 oz (92.579 kg). His oral temperature is 98.5 F (36.9 C). His blood pressure is 133/63 and his pulse is 79. His respiration is 16 and oxygen saturation is 96%.  General: Alert and oriented, in no acute distress HEENT: Head is normocephalic. Extraocular movements are intact. Oropharynx is notable for no thrush or lesions. Neck: Neck is notable for no palpable masses.  Skin: Skin in treatment fields has healed well. Heart: Regular in rate and rhythm with no murmurs, rubs, or gallops. Chest: Clear to auscultation bilaterally, with no rhonchi, wheezes, or rales. Abdomen: Soft, nontender, nondistended, with no rigidity or guarding. Extremities: No cyanosis or edema. Lymphatics: see Neck Exam Psychiatric: Judgment and insight are intact. Affect is appropriate.   Lab Findings: Lab Results  Component Value Date   WBC 7.0 04/06/2015   HGB 13.3 04/06/2015   HCT 41.7 04/06/2015   MCV 89.7 04/06/2015   PLT 146* 04/06/2015    No results found for: TSH  Radiographic Findings: No results found.  Impression/Plan:    1) Head and Neck Cancer Status: Healing from radiotherapy.  2) Nutritional Status: The pt is gaining weight. PEG tube: Yes and it will be removed in 06/01/15.  3) Risk Factors: The patient has been educated about risk factors  including alcohol and tobacco abuse; they understand that avoidance of alcohol and tobacco is important to prevent recurrences as well as other cancers  4) Swallowing: No difficulties.  5) Denta The pt will have a molar removed in the near future (mouth NOT in RT FIELD).  6) Thyroid function: No results found for: TSH   The pt had a thyroidectomy and follows with another physician for thyroid supplementation.  7) Other: Gayleen Orem, RN, our Head and Neck Oncology Navigator will schedule a follow up with Dr. Redmond Baseman for laryngoscopy in 1 month.  8)  I gave the pt a card to follow-up with me in 4 months - laryngoscopy at that time. The patient was encouraged to call with any issues or questions before then.     This document serves as a record of services personally performed by Eppie Gibson, MD. It was created on her behalf by Darcus Austin, a trained medical scribe. The creation of this record is based on the scribe's personal observations and the provider's statements to them. This document has been checked and approved by the attending provider.     _____________________________________   Eppie Gibson, MD

## 2015-05-31 ENCOUNTER — Telehealth: Payer: Self-pay | Admitting: *Deleted

## 2015-05-31 ENCOUNTER — Ambulatory Visit: Admission: RE | Admit: 2015-05-31 | Payer: Medicare Other | Source: Ambulatory Visit | Admitting: Radiation Oncology

## 2015-05-31 ENCOUNTER — Ambulatory Visit: Payer: Self-pay | Admitting: Radiation Oncology

## 2015-05-31 NOTE — Telephone Encounter (Signed)
  Oncology Nurse Navigator Documentation   Navigator Encounter Type: Telephone (05/31/15 1002)      Per Dr. Pearlie Oyster guidance, called [facility].  Spoke with Kalman Shan, requested that patient be contacted and appt be arranged to see Dr. Redmond Baseman in 1 month.  She verbalized understanding.   Gayleen Orem, RN, BSN, [place] at [place] [phone]

## 2015-06-01 ENCOUNTER — Ambulatory Visit (HOSPITAL_COMMUNITY)
Admission: RE | Admit: 2015-06-01 | Discharge: 2015-06-01 | Disposition: A | Discharge status: Home / Self Care | Payer: Medicare Other | Source: Ambulatory Visit | Attending: Radiation Oncology | Admitting: Radiation Oncology

## 2015-06-01 DIAGNOSIS — Z431 Encounter for attention to gastrostomy: Secondary | ICD-10-CM | POA: Insufficient documentation

## 2015-06-01 DIAGNOSIS — D020 Carcinoma in situ of larynx: Secondary | ICD-10-CM

## 2015-06-01 MED ORDER — LIDOCAINE VISCOUS 2 % MT SOLN
OROMUCOSAL | Status: AC
  Administered 2015-06-01: 15 mL via OROMUCOSAL
  Filled 2015-06-01: qty 15

## 2015-06-01 MED ORDER — LIDOCAINE VISCOUS 2 % MT SOLN
15.0000 mL | Freq: Once | OROMUCOSAL | Status: AC
  Administered 2015-06-01: 15 mL via OROMUCOSAL

## 2015-06-01 NOTE — Progress Notes (Signed)
Patient presented for removal of gastrostomy tube that is no longer needed, he is eating and swallowing well without difficulty x 3-4 weeks. Successful removal of 20 F pull through gastrostomy tube without immediate complications, sterile dressing placed and instructions given.  Tsosie Billing PA-C Interventional Radiology  06/01/15  2:09 PM

## 2015-06-08 ENCOUNTER — Ambulatory Visit: Payer: Medicare Other | Admitting: Radiation Oncology

## 2015-06-15 ENCOUNTER — Ambulatory Visit: Payer: Medicare Other | Admitting: Radiation Oncology

## 2015-06-22 ENCOUNTER — Ambulatory Visit: Payer: Medicare Other | Attending: Radiation Oncology

## 2015-06-22 DIAGNOSIS — R1310 Dysphagia, unspecified: Secondary | ICD-10-CM | POA: Insufficient documentation

## 2015-06-23 NOTE — Therapy (Signed)
[facility] [address] [place], [place], [address] Phone: [phone]   Fax:  [phone]  Speech Language Pathology Treatment  Patient Details  Name: Victor [name] MRN: [id number] Date of Birth: 1936-03-27 Referring Provider:  Junie Panning, NP  Encounter Date: 06/22/2015      End of Session - 06/23/15 1413    Visit Number 3   Number of Visits 5   Date for SLP Re-Evaluation 08/19/15   SLP Start Time 47   SLP Stop Time  1059   SLP Time Calculation (min) 41 min   Activity Tolerance Patient tolerated treatment well      Past Medical History  Diagnosis Date  . Thyroid disease   . Arthritis   . Cancer 11/12/14    vocal cord  carcinoma in situ   . BPH (benign prostatic hyperplasia)   . GERD (gastroesophageal reflux disease)   . Atherosclerosis   . H/O carotid atherosclerosis     b/l  . Carotid bruit   . Bulging lumbar disc   . Chronic kidney disease     chronic stage III  . Dysphonia   . Hyperlipidemia   . Spondylosis of lumbosacral region   . Occasional tremors     left hand managed with propranolol  . DDD (degenerative disc disease), lumbar   . Atherosclerotic PVD with intermittent claudication   . Peripheral vascular angioplasty status with implants and grafts   . Dysplasia of true vocal cord   . Sleep apnea     hasn't used CPAP in years    Past Surgical History  Procedure Laterality Date  . Vocal cord biopsy  11/12/14    squamous cell carcinoma in situ  . Appendectomy    . Tonsillectomy    . Mouth surgery      tooth extraction  . Thyroid surgery      There were no vitals filed for this visit.  Visit Diagnosis: Dysphagia      Subjective Assessment - 06/22/15 1027    Subjective Ate salmon baked beans, potato salad last night.               ADULT SLP TREATMENT - 06/22/15 1027    General Information   Behavior/Cognition Alert;Cooperative;Pleasant mood   Treatment  Provided   Treatment provided Dysphagia   Dysphagia Treatment   Temperature Spikes Noted No   Respiratory Status Room air   Treatment Methods Skilled observation;Therapeutic exercise;Compensation strategy training;Patient/caregiver education   Patient observed directly with PO's Yes   Type of PO's observed Dysphagia 3 (soft);Thin liquids   Liquids provided via Cup   Pharyngeal Phase Signs & Symptoms Wet vocal quality   Type of cueing Verbal   Amount of cueing Minimal   Other treatment/comments Pt doing approx 2/3 exercises of HEP. Eating regular diet/thin liquids. SLP, after POs today, told pt he needed to throat clear  intermittently during meals adn explained rationale why. HEP completed with rare min A. Effortful swallow was suggested during mealtimes in order to incr amount of HEP practice throughout the day.   Pain Assessment   Pain Assessment No/denies pain   Assessment / Recommendations / Plan   Plan Continue with current plan of care   Dysphagia Recommendations   Diet recommendations Regular;Dysphagia 3 (mechanical soft);Thin liquid   Compensations Clear throat intermittently;Effortful swallow   Progression Toward Goals   Progression toward goals Progressing toward goals          SLP Education -  06/23/15 1413    Education provided Yes   Education Details HEP, muscle fibrosis          SLP Short Term Goals - 06/22/15 1416    SLP SHORT TERM GOAL #1   Title pt will perform HEP with rare verbal cues   Status Achieved   SLP SHORT TERM GOAL #2   Title pt will tell SLP why he completes HEP   Baseline renewed 06-22-15   Time 1   Period --  visit   Status On-going  continue goal (05-11-15)          SLP Long Term Goals - 06/23/15 1417    SLP LONG TERM GOAL #1   Title pt will perform HEP with independence   Time 2   Period --  vistis   Status On-going  not met for 05-22-15;ongoing until 08-19-15   SLP LONG TERM GOAL #2   Title pt will tell SLP 3 overt s/s  aspiration PNA   Time --   Period --   Status Achieved   SLP LONG TERM GOAL #3   Title pt will tell SLP how a food journal can assist return to full PO diet   Time 1   Period --  visits   Status Deferred   SLP LONG TERM GOAL #4   Title pt will follow swallow precautions (throat clear intermitently/reswallow)   Time 2   Period --  visits   Status New          Plan - 06/23/15 1414    Clinical Impression Statement See goals above for goal status. Pt cont to swallow with minimal diffiuclty, evidenced by hydrophonic voice intermittently, cleared with cued throat clear/reswallow. Pt req'd cues with HEP today. He cont to need skilled ST to ensure consistency with proper HEP procedure and adhering to throat clear intermittently with POs to minimize aspiration risk.   Speech Therapy Frequency --  approx every four weeks   Duration --  for 60 days   Treatment/Interventions Aspiration precaution training;Compensatory strategies;SLP instruction and feedback;Oral motor exercises;Pharyngeal strengthening exercises;Trials of upgraded texture/liquids   Potential to Achieve Goals Good        Problem List Patient Active Problem List   Diagnosis Date Noted  . 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    [facility] , MS, CCC-SLP  06/23/2015, 2:23 PM  [facility] [address] [place], [place], [address] Phone: [phone]   Fax:  [phone]

## 2015-06-23 NOTE — Patient Instructions (Signed)
Do all of the exercises  Swallow hard when you eat

## 2015-08-10 ENCOUNTER — Ambulatory Visit: Payer: Medicare Other | Attending: Radiation Oncology

## 2015-08-10 DIAGNOSIS — R1310 Dysphagia, unspecified: Secondary | ICD-10-CM | POA: Diagnosis not present

## 2015-08-10 NOTE — Therapy (Signed)
[facility] [address] [place], [place], [address] Phone: [phone]   Fax:  [phone]  Speech Language Pathology Treatment  Patient Details  Name: Victor [name] MRN: [id number] Date of Birth: 14-Jan-1936 Referring Provider:  Junie Panning, NP  Encounter Date: 08/10/2015      End of Session - 08/10/15 1225    Visit Number 4   Number of Visits 5   Date for SLP Re-Evaluation 08/19/15   Authorization Type needs recert/dc for visit in December   SLP Start Time 1104   SLP Stop Time  1144   SLP Time Calculation (min) 40 min   Activity Tolerance Patient tolerated treatment well      Past Medical History  Diagnosis Date  . Thyroid disease   . Arthritis   . Cancer 11/12/14    vocal cord  carcinoma in situ   . BPH (benign prostatic hyperplasia)   . GERD (gastroesophageal reflux disease)   . Atherosclerosis   . H/O carotid atherosclerosis     b/l  . Carotid bruit   . Bulging lumbar disc   . Chronic kidney disease     chronic stage III  . Dysphonia   . Hyperlipidemia   . Spondylosis of lumbosacral region   . Occasional tremors     left hand managed with propranolol  . DDD (degenerative disc disease), lumbar   . Atherosclerotic PVD with intermittent claudication   . Peripheral vascular angioplasty status with implants and grafts   . Dysplasia of true vocal cord   . Sleep apnea     hasn't used CPAP in years    Past Surgical History  Procedure Laterality Date  . Vocal cord biopsy  11/12/14    squamous cell carcinoma in situ  . Appendectomy    . Tonsillectomy    . Mouth surgery      tooth extraction  . Thyroid surgery      There were no vitals filed for this visit.  Visit Diagnosis: Dysphagia      Subjective Assessment - 08/10/15 1133    Subjective Pt is eating POs as desires. Pt fell four weeks ago, pain upon inhalation. Wet voice upon entering ST room.               ADULT SLP  TREATMENT - 08/10/15 1113    General Information   Behavior/Cognition Alert;Cooperative;Pleasant mood   Treatment Provided   Treatment provided Dysphagia   Dysphagia Treatment   Temperature Spikes Noted No   Respiratory Status Room air   Treatment Methods Skilled observation;Therapeutic exercise   Patient observed directly with PO's Yes   Type of PO's observed Regular;Thin liquids   Liquids provided via Cup   Pharyngeal Phase Signs & Symptoms Wet vocal quality   Type of cueing Verbal   Amount of cueing Minimal   Other treatment/comments Pt's PCP (per pt) stated lungs are clear, in last 4 weeks. With POs, no throat clearing, just wet voice. Pt states he has not been voluntarily    Pain Assessment   Pain Assessment No/denies pain  only if pt takes deep breath            SLP Short Term Goals - 08/10/15 1227    SLP SHORT TERM GOAL #1   Title pt will perform HEP with rare verbal cues   Status Achieved   SLP SHORT TERM GOAL #2   Title pt will tell SLP why he completes HEP   Baseline  renewed 06-22-15   Time 1   Period --  visit   Status Achieved  continue goal (05-11-15)          SLP Long Term Goals - 08/10/15 1227    SLP LONG TERM GOAL #1   Title pt will perform HEP with independence   Time 2   Period --  vistis   Status Achieved  not met for 05-22-15;ongoing until 08-19-15   SLP LONG TERM GOAL #2   Title pt will tell SLP 3 overt s/s aspiration PNA   Status Achieved   SLP LONG TERM GOAL #3   Title pt will tell SLP how a food journal can assist return to full PO diet   Time 1   Period --  visits   Status Deferred   SLP LONG TERM GOAL #4   Title pt will follow swallow precautions (throat clear intermitently/reswallow)   Time 1   Period --  visits   Status New   SLP LONG TERM GOAL #5   Title perform HEP with independence over two sessions   Time 1   Period --  for visit in December (visit in Oct was independent)   Status New          Plan - 08/10/15  1225    Clinical Impression Statement Pt with hydrophonic voice throughout session, throat clearing throughout session. No overt s/s aspiration PNA reported, pt reports PCP told him lungs clear in the last 4 weeks. Skilled ST remains necessary for one more visit to assess correct procedure for HEP and monitor safety withPOs.   Speech Therapy Frequency --  every other month   Duration --  one more visit   Treatment/Interventions Aspiration precaution training;Compensatory strategies;SLP instruction and feedback;Oral motor exercises;Pharyngeal strengthening exercises;Trials of upgraded texture/liquids   Potential to Achieve Goals Good        Problem List Patient Active Problem List   Diagnosis Date Noted  . 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    [facility] , MS, CCC-SLP  08/10/2015, 12:29 PM  [facility] [address] [place], [place], [address] Phone: [phone]   Fax:  [phone]

## 2015-08-10 NOTE — Patient Instructions (Signed)
Signs of Aspiration Pneumonia   . Chest pain/tightness . Fever (can be low grade) . Cough  o With foul-smelling phlegm (sputum) o With sputum containing pus or blood o With greenish sputum . Fatigue  . Shortness of breath  . Wheezing   **IF YOU HAVE THESE SIGNS, CONTACT YOUR DOCTOR OR GO TO THE EMERGENCY DEPARTMENT OR URGENT CARE AS SOON AS POSSIBLE**      

## 2015-08-17 ENCOUNTER — Encounter: Payer: Self-pay | Admitting: *Deleted

## 2015-08-17 NOTE — Progress Notes (Signed)
Qwinton and his wife attended the Fall 2016 5-week [facility]&N [facility] program (Tuesday evening sessions, 6:00-7:15 pm, [facility], beginning 07/19/15).  Gayleen Orem, RN, BSN, [place] at [place] [phone]

## 2015-09-13 ENCOUNTER — Other Ambulatory Visit: Payer: Self-pay | Admitting: Family

## 2015-09-13 DIAGNOSIS — M25512 Pain in left shoulder: Secondary | ICD-10-CM

## 2015-09-23 ENCOUNTER — Ambulatory Visit
Admission: RE | Admit: 2015-09-23 | Discharge: 2015-09-23 | Disposition: A | Discharge status: Home / Self Care | Payer: Medicare Other | Source: Ambulatory Visit | Attending: Radiation Oncology | Admitting: Radiation Oncology

## 2015-09-23 ENCOUNTER — Encounter: Payer: Self-pay | Admitting: Radiation Oncology

## 2015-09-23 ENCOUNTER — Encounter: Payer: Self-pay | Admitting: Adult Health

## 2015-09-23 VITALS — BP 134/63 | HR 60 | Temp 98.4°F | Ht 70.0 in | Wt 214.3 lb

## 2015-09-23 DIAGNOSIS — D020 Carcinoma in situ of larynx: Secondary | ICD-10-CM

## 2015-09-23 NOTE — Progress Notes (Signed)
I briefly met Mr. Scalzo and his wife today during the patient's routine radiation oncology follow-up appt with Dr. Isidore Moos.  I briefly introduced myself, the goals of survivorship, and my potential role in his future cancer care.  I gave him a copy of the "Life After Cancer for Every Survivor" booklet, as well as my business card.  I encouraged the patient to call me with any questions, concerns, or new symptoms and I will be happy to see him in clinic, if needed.  I look forward to participating in Mr. Leinen care.    Mike Craze, NP [place] [phone]

## 2015-09-23 NOTE — Progress Notes (Signed)
[name] here today for a FU ready in exam room 8 [name]

## 2015-09-23 NOTE — Progress Notes (Signed)
Radiation Oncology         [phone] ________________________________  Name: Victor [name] MRN: [id number]  Date: 09/23/2015  DOB: April 09, 1936  Follow-Up Visit Note  CC: Smothers, Andree Elk, NP  Melida Quitter, MD  Diagnosis and Prior Radiotherapy:       ICD-9-CM ICD-10-CM   1. Carcinoma in situ of vocal cord 231.0 D02.0    Carcinoma in situ of the vocal cord  Indication for treatment:  curative       Radiation treatment dates:   03/10/2015-04/19/2015  Site/dose:   Larynx / 63 Gy in 28 fractions  Narrative:  The patient returns today for routine follow-up. The patient reports feeling good. No trouble swallowing. He feels pain when he yawns (in his laryngeal region). Does not feel that his voice has gotten worse. He reports that he will see his ENT (Dr. Redmond Baseman) next month. He saw him since his last f/u with me in July.  He has gained 10lbs since July. Wt Readings from Last 3 Encounters:  09/23/15 214 lb 4.8 oz (97.206 kg)  05/27/15 204 lb 1.6 oz (92.579 kg)  04/18/15 205 lb 6.4 oz (93.169 kg)     ALLERGIES:  has No Known Allergies.  Meds: Current Outpatient Prescriptions  Medication Sig Dispense Refill  . diclofenac sodium (VOLTAREN) 1 % GEL Apply 2 g topically as needed (pain).     Marland Kitchen doxazosin (CARDURA) 2 MG tablet Take 2 mg by mouth daily.    Marland Kitchen FA-B6-B12-D-Omega 3-Phytoster (ANIMI-3/VITAMIN D PO) Take 1 tablet by mouth daily. d3    . fluconazole (DIFLUCAN) 10 MG/ML suspension Take 200 mg (20 mL) today, then 100 mg (10 mL) for 6 more days. Hold simvastatin while on this drug. 80 mL 0  . HYDROcodone-acetaminophen (HYCET) 7.5-325 mg/15 ml solution Take 10 mLs by mouth every 4 (four) hours as needed for moderate pain. 473 mL 0  . levothyroxine (SYNTHROID, LEVOTHROID) 150 MCG tablet Take 150 mcg by mouth daily before breakfast.    . Liniments (SALONPAS EX) Apply topically as needed. patches    . Loratadine (CLARITIN PO) Take 10 mg by mouth as needed (allergies).     Marland Kitchen  omeprazole (PRILOSEC) 20 MG capsule Take 20 mg by mouth daily.    . propranolol (INDERAL) 80 MG tablet Take 80 mg by mouth daily.     . traMADol (ULTRAM) 50 MG tablet Take by mouth every 6 (six) hours as needed for severe pain.     Marland Kitchen aspirin 81 MG tablet Take 81 mg by mouth daily.    Marland Kitchen emollient (BIAFINE) cream Apply 1 application topically 2 (two) times daily.    . fentaNYL (DURAGESIC - DOSED MCG/HR) 12 MCG/HR Please 1-2 patches on skin every 72 hours instead of the 59mcg patch, as you taper your pain medication dosage. (Patient not taking: Reported on 05/27/2015) 5 patch 0  . fentaNYL (DURAGESIC - DOSED MCG/HR) 25 MCG/HR patch Place 1 patch (25 mcg total) onto the skin every 3 (three) days. (Patient not taking: Reported on 05/27/2015) 1 patch 0  . ipratropium (ATROVENT) 0.06 % nasal spray Place into the nose.    Marland Kitchen MEGARED OMEGA-3 KRILL OIL 500 MG CAPS Take 1 tablet by mouth daily.     . Nutritional Supplements (FEEDING SUPPLEMENT, OSMOLITE 1.5 CAL,) LIQD Begin 1 can of Osmolite 1.5 QID via feeding tube with 120 cc free water before and after each feeding.  Increase to 1.5 cans QID on day 5. Flush tube or drink an  additional 240 cc free water TID between feedings. (Patient not taking: Reported on 05/27/2015) 1422 mL 0  . ranitidine (ZANTAC) 150 MG tablet Take 150 mg by mouth daily.    Marland Kitchen senna (SENOKOT) 8.6 MG TABS tablet Take 1 tab QHS to prevent constipation while on Duragesic, but stop if you have diarrhea. (Patient not taking: Reported on 08/10/2015) 30 each 2  . simvastatin (ZOCOR) 40 MG tablet Take 40 mg by mouth daily.     No current facility-administered medications for this encounter.    Physical Findings: The patient is in no acute distress. Patient is alert and oriented. Wt Readings from Last 3 Encounters:  09/23/15 214 lb 4.8 oz (97.206 kg)  05/27/15 204 lb 1.6 oz (92.579 kg)  04/18/15 205 lb 6.4 oz (93.169 kg)    height is 5\' 10"  (1.778 m) and weight is 214 lb 4.8 oz (97.206 kg). His  oral temperature is 98.4 F (36.9 C). His blood pressure is 134/63 and his pulse is 60. His oxygen saturation is 94%.   Oropharanx is clear with no thrush or lesions. No palpable cervical or supraclavicular adenopathy. Lungs are clear to auscultation bilaterally. Heart has regular rate and rhythm with no murmurs.  Laryngoscopic exam offered but will be deferred as pt sees ENT next month  Lab Findings: Lab Results  Component Value Date   WBC 7.0 04/06/2015   HGB 13.3 04/06/2015   HCT 41.7 04/06/2015   MCV 89.7 04/06/2015   PLT 146* 04/06/2015    No results found for: TSH  Radiographic Findings: No results found.  Impression/Plan:    1) Head and Neck Cancer Status: NED  2) Nutritional Status: no issues  3) Risk Factors: The patient has been educated about risk factors including alcohol and tobacco abuse; he understands that avoidance of alcohol and tobacco is important to prevent recurrences as well as other cancers  4) Swallowing: No difficulties.  5) Thyroid function: No results found for: TSH  The pt had a thyroidectomy and follows with another physician for thyroid supplementation.  6) Other: He will have a laryngoscopy per Dr. Redmond Baseman next month. The patient was given a "Life After Cancer for Every Survivor" booklet and our Survivorship Navigator, Mike Craze, NP, gave the patient her contact information.  Patient denies depression.  7) I gave the pt a card to follow-up with me in mid-March. The patient was encouraged to call with any issues or questions before then.  This document serves as a record of services personally performed by Eppie Gibson, MD. It was created on her behalf by Darcus Austin, a trained medical scribe. The creation of this record is based on the scribe's personal observations and the provider's statements to them. This document has been checked and approved by the attending provider.     _____________________________________   Eppie Gibson,  MD

## 2015-09-26 DIAGNOSIS — M47816 Spondylosis without myelopathy or radiculopathy, lumbar region: Secondary | ICD-10-CM | POA: Insufficient documentation

## 2015-10-13 ENCOUNTER — Ambulatory Visit
Admission: RE | Admit: 2015-10-13 | Discharge: 2015-10-13 | Disposition: A | Discharge status: Home / Self Care | Payer: Medicare Other | Source: Ambulatory Visit | Attending: Family | Admitting: Family

## 2015-10-13 DIAGNOSIS — M25512 Pain in left shoulder: Secondary | ICD-10-CM

## 2015-10-20 ENCOUNTER — Telehealth: Payer: Self-pay | Admitting: *Deleted

## 2015-10-20 NOTE — Telephone Encounter (Signed)
     Oncology Nurse Navigator Documentation   Navigator Encounter Type: Telephone;6 month (10/20/15 1100) Patient Visit Type: Follow-up (10/20/15 1100)     Called patient to check on well-being since 04/19/15 tmt completion.  LVMM, asked him to return my call.  Gayleen Orem, RN, BSN, [place] at [place] [phone]                   Time Spent with Patient: 15 (10/20/15 1100)

## 2015-10-24 ENCOUNTER — Ambulatory Visit: Payer: Medicare Other | Attending: Radiation Oncology

## 2015-10-24 DIAGNOSIS — R1310 Dysphagia, unspecified: Secondary | ICD-10-CM | POA: Diagnosis present

## 2015-10-24 NOTE — Patient Instructions (Signed)
If you have increased frequency coughing during meals or excessive throat clearing, contact Dr. Redmond Baseman.  If Dr. Redmond Baseman is no longer following you, contact your primary care physician.

## 2015-10-24 NOTE — Therapy (Signed)
[facility] [address] [place], [place], [address] Phone: [phone]   Fax:  [phone]  Speech Language Pathology Treatment  Patient Details  Name: Victor [name] MRN: [id number] Date of Birth: December 05, 1935 No Data Recorded  Encounter Date: 10/24/2015      End of Session - 10/24/15 1146    Visit Number 5   Number of Visits 5   Date for SLP Re-Evaluation 10/24/15   SLP Start Time 1103   SLP Stop Time  1126  due to d/c visit   SLP Time Calculation (min) 23 min   Activity Tolerance Patient tolerated treatment well      Past Medical History  Diagnosis Date  . Thyroid disease   . Arthritis   . Cancer ([place]) 11/12/14    vocal cord  carcinoma in situ   . BPH (benign prostatic hyperplasia)   . GERD (gastroesophageal reflux disease)   . Atherosclerosis   . H/O carotid atherosclerosis     b/l  . Carotid bruit   . Bulging lumbar disc   . Chronic kidney disease     chronic stage III  . Dysphonia   . Hyperlipidemia   . Spondylosis of lumbosacral region   . Occasional tremors     left hand managed with propranolol  . DDD (degenerative disc disease), lumbar   . Atherosclerotic PVD with intermittent claudication ([place])   . Peripheral vascular angioplasty status with implants and grafts   . Dysplasia of true vocal cord   . Sleep apnea     hasn't used CPAP in years    Past Surgical History  Procedure Laterality Date  . Vocal cord biopsy  11/12/14    squamous cell carcinoma in situ  . Appendectomy    . Tonsillectomy    . Mouth surgery      tooth extraction  . Thyroid surgery      There were no vitals filed for this visit.  Visit Diagnosis: Dysphagia - Plan: SLP plan of care cert/re-cert             ADULT SLP TREATMENT - 10/24/15 1107    General Information   Behavior/Cognition Alert;Cooperative;Pleasant mood   Treatment Provided   Treatment provided Dysphagia   Dysphagia Treatment   Temperature Spikes Noted No   Treatment Methods Skilled observation;Therapeutic exercise;Patient/caregiver education   Patient observed directly with PO's Yes   Type of PO's observed Regular;Thin liquids   Liquids provided via Cup   Oral Phase Signs & Symptoms --  none   Pharyngeal Phase Signs & Symptoms --  none noted   Other treatment/comments Pt ate cereal bar and drank water without overt s/s aspiration.  Pt reports still has difficulty with peanuts and almonds, nothing else. SLP suggested smaller bite sizes, and adequate mastication. With pt's HEP, he read the exercises to himself as he completed exercises. He completed them independently.   Pain Assessment   Pain Assessment No/denies pain   Assessment / Recommendations / Plan   Plan Discharge SLP treatment due to (comment)  pt performing HEP correctly after two months, no s/s PNA   Dysphagia Recommendations   Diet recommendations Regular;Thin liquid   Compensations --  with nuts, small bite sizes, masticate thoroughly   Progression Toward Goals   Progression toward goals Goals met, education completed, patient discharged from [facility] Education - 10/24/15 1146    Education provided Yes   Education Details muscle  fibrosis, swallow precautions for nuts   Person(s) Educated Patient   Methods Explanation   Comprehension Verbalized understanding          SLP Short Term Goals - 08/10/15 1227    SLP SHORT TERM GOAL #1   Title pt will perform HEP with rare verbal cues   Status Achieved   SLP SHORT TERM GOAL #2   Title pt will tell SLP why he completes HEP   Baseline renewed 06-22-15   Time 1   Period --  visit   Status Achieved  continue goal (05-11-15)          SLP Long Term Goals - Nov 12, 2015 1150    SLP LONG TERM GOAL #1   Title pt will perform HEP with independence   Time 2   Period --  vistis   Status Achieved  not met for 05-22-15;ongoing until 08-19-15   SLP LONG TERM GOAL #2   Title pt will tell  SLP 3 overt s/s aspiration PNA   Status Achieved   SLP LONG TERM GOAL #3   Title pt will tell SLP how a food journal can assist return to full PO diet   Time 1   Period --  visits   Status Deferred   SLP LONG TERM GOAL #4   Title pt will follow swallow precautions (throat clear intermitently/reswallow)   Status Deferred  reports only difficulty is with peanuts and almonds; no hydrophonic voice today   SLP LONG TERM GOAL #5   Title perform HEP with independence over two sessions   Status Achieved          Plan - 11/12/2015 1147    Clinical Impression Statement Pt must be seen today to assess HEP and assess consistency with POs. He was without hydrophonic voice today, and there were no concerning overt s/s aspiration noted during POs. Pt completed HEP without cues needed. SLP reminded pt to cont to complete HEP x2-3/week to maintain proper swallow health. Discharge at this time.   Speech Therapy Frequency --  one visit - today   Treatment/Interventions Aspiration precaution training;Compensatory strategies;SLP instruction and feedback;Oral motor exercises;Pharyngeal strengthening exercises;Trials of upgraded texture/liquids   Potential to Achieve Goals Good   Consulted and Agree with Plan of Care Patient          G-Codes - 11-12-15 1151    Functional Assessment Tool Used noms - 7   Functional Limitations Swallowing   Swallow Goal Status (G9562) At least 1 percent but less than 20 percent impaired, limited or restricted   Swallow Discharge Status [phone]) 0 percent impaired, limited or restricted      [employer] SUMMARY  Visits from Start of Care: 5  Current functional level related to goals / functional outcomes: Pt was discharged today having the above progress with his long term and short term goals. He did not exhibit hydrophonic voice today as in session in October, indicating an improvement in swallow function since that time. He remains with dysphagia  with, reportedly, only nuts, however also reports he had difficulty pre-radiaiton with these as well. SLP told pt to [facility] thoroughly and reduce bite sizes with nuts.    Remaining deficits: None   Education / Equipment: HEP, swallow precautions for nuts, late effects radiation therapy to swallow mechanism.  Plan: Patient agrees to discharge.  Patient goals were partially met. Patient is being discharged due to meeting the stated rehab goals.  ?????       Problem List  Patient Active Problem List   Diagnosis Date Noted  . 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    [facility] , MS, CCC-SLP   10/24/2015, 11:54 AM  [facility] [address] [place], [place], [address] Phone: [phone]   Fax:  [phone]   Name: Victor [name] MRN: [id number] Date of Birth: 1936-07-08

## 2016-01-03 NOTE — Progress Notes (Signed)
  Victor [name] presents for follow up of radiation completed 04/19/2015 to his Larynx.  Pain issues, if any: He only complains of chronic pain in his lower back which he rates a 3/10 Using a feeding tube?: No Weight changes, if any:  Wt Readings from Last 3 Encounters:  01/06/16 218 lb 8 oz (99.111 kg)  10/13/15 210 lb (95.255 kg)  09/23/15 214 lb 4.8 oz (97.206 kg)   Swallowing issues, if any: He reports no difficulty swallowing and is not modifying his diet.  Smoking or chewing tobacco? No Using fluoride trays daily? No Last ENT visit was on: 10/20/2015 Dr. Melida Quitter, His next appointment will be in April or May. Other notable issues, if any: No  BP 130/62 mmHg  Pulse 59  Temp(Src) 97.7 F (36.5 C)  Ht 5\' 10"  (1.778 m)  Wt 218 lb 8 oz (99.111 kg)  BMI 31.35 kg/m2

## 2016-01-06 ENCOUNTER — Encounter: Payer: Self-pay | Admitting: Radiation Oncology

## 2016-01-06 ENCOUNTER — Ambulatory Visit
Admission: RE | Admit: 2016-01-06 | Discharge: 2016-01-06 | Disposition: A | Discharge status: Home / Self Care | Payer: Medicare Other | Source: Ambulatory Visit | Attending: Radiation Oncology | Admitting: Radiation Oncology

## 2016-01-06 VITALS — BP 130/62 | HR 59 | Temp 97.7°F | Ht 70.0 in | Wt 218.5 lb

## 2016-01-06 DIAGNOSIS — Z08 Encounter for follow-up examination after completed treatment for malignant neoplasm: Secondary | ICD-10-CM | POA: Insufficient documentation

## 2016-01-06 DIAGNOSIS — D020 Carcinoma in situ of larynx: Secondary | ICD-10-CM | POA: Diagnosis present

## 2016-01-06 DIAGNOSIS — E039 Hypothyroidism, unspecified: Secondary | ICD-10-CM | POA: Insufficient documentation

## 2016-01-06 DIAGNOSIS — Z7982 Long term (current) use of aspirin: Secondary | ICD-10-CM | POA: Diagnosis not present

## 2016-01-06 DIAGNOSIS — Z923 Personal history of irradiation: Secondary | ICD-10-CM | POA: Diagnosis not present

## 2016-01-06 MED ORDER — LARYNGOSCOPY SOLUTION RAD-ONC
15.0000 mL | Freq: Once | TOPICAL | Status: AC
  Administered 2016-01-06: 15 mL via TOPICAL
  Filled 2016-01-06: qty 15

## 2016-01-06 NOTE — Progress Notes (Signed)
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 Caban presents for follow up of radiation completed 04/19/2015 to his Larynx.  Pain issues, if any: He only complains of chronic pain in his lower back which he rates a 3/10 Using a feeding tube?: No Weight changes, if any:  Wt Readings from Last 3 Encounters:  01/06/16 218 lb 8 oz (99.111 kg)  10/13/15 210 lb (95.255 kg)  09/23/15 214 lb 4.8 oz (97.206 kg)   Swallowing issues, if any: He reports no difficulty swallowing and is not modifying his diet.  Smoking or chewing tobacco? No Using fluoride trays daily? No Last ENT visit was on: 10/20/2015 Dr. Melida Quitter, His next appointment will be in April or May.    The patient underwent laryngoscopy in December and was found to have no evidence of disease.   Wt Readings from Last 3 Encounters:  01/06/16 218 lb 8 oz (99.111 kg)  10/13/15 210 lb (95.255 kg)  09/23/15 214 lb 4.8 oz (97.206 kg)   ALLERGIES:  has No Known Allergies.  Meds: Current Outpatient Prescriptions  Medication Sig Dispense Refill  . aspirin 81 MG tablet Take 81 mg by mouth daily.    . diclofenac sodium (VOLTAREN) 1 % GEL Apply 2 g topically as needed (pain).     Marland Kitchen doxazosin (CARDURA) 2 MG tablet Take 2 mg by mouth daily.    Marland Kitchen FA-B6-B12-D-Omega 3-Phytoster (ANIMI-3/VITAMIN D PO) Take 1 tablet by mouth daily. d3    . HYDROcodone-acetaminophen (HYCET) 7.5-325 mg/15 ml  solution Take 10 mLs by mouth every 4 (four) hours as needed for moderate pain. 473 mL 0  . levothyroxine (SYNTHROID, LEVOTHROID) 150 MCG tablet Take 150 mcg by mouth daily before breakfast.    . Liniments (SALONPAS EX) Apply topically as needed. patches    . Loratadine (CLARITIN PO) Take 10 mg by mouth as needed (allergies).     . MEGARED OMEGA-3 KRILL OIL 500 MG CAPS Take 1 tablet by mouth daily.     Marland Kitchen omeprazole (PRILOSEC) 20 MG capsule Take 20 mg by mouth daily.    . propranolol (INDERAL) 80 MG tablet Take 80 mg by mouth daily.     . ranitidine (ZANTAC) 150 MG tablet Take 150 mg by mouth daily.    . simvastatin (ZOCOR) 40 MG tablet Take 40 mg by mouth daily.    . traMADol (ULTRAM) 50 MG tablet Take by mouth every 6 (six) hours as needed for severe pain.      No current facility-administered medications for this encounter.    Physical Findings: The patient is in no acute distress. Patient is alert and oriented. Wt Readings from Last 3 Encounters:  01/06/16 218 lb 8 oz (99.111 kg)  10/13/15 210 lb (95.255 kg)  09/23/15 214 lb 4.8 oz (97.206 kg)    height is 5\' 10"  (1.778 m) and weight is  218 lb 8 oz (99.111 kg). His temperature is 97.7 F (36.5 C). His blood pressure is 130/62 and his pulse is 59.   Oral cavity and oropharanx are clear with no thrush or lesions. Neck is supple with no palpable cervical or supraclavicular adenopathy. Lungs are clear to auscultation bilaterally. Heart has regular rate and rhythm with no murmurs.    On laryngoscopy exam, oropharynx and larynx are clear without evidence of disease recurrence.  Vocal cords are symmetrically mobile  Lab Findings: Lab Results  Component Value Date   WBC 7.0 04/06/2015   HGB 13.3 04/06/2015   HCT 41.7 04/06/2015   MCV 89.7 04/06/2015   PLT 146* 04/06/2015    No results found for: TSH  Radiographic Findings: No results found.   Impression/Plan:   1) Head and Neck Cancer Status: No evidence of disease  2)  Nutritional Status: The patient is eating well with good nutrition   3) Risk Factors: The patient has been educated about risk factors including tobacco abuse; they understand that avoidance of  tobacco is important to prevent recurrences as well as other cancers  4) Social: No active social issues to address at this time  5) Hypothyroidism: preceded RT; Continued management with PCP  Seeing Dr.Bates in May. Follow up in radiation oncology in 93mo ( I recommended in 5 mo, but he declined). Gave follow up card. The patient knows to contact me if he has any questions or concerns.  _____________________________________   Eppie Gibson, MD  This document serves as a record of services personally performed by Eppie Gibson, MD. It was created on her behalf by Derek Mound, a trained medical scribe. The creation of this record is based on the scribe's personal observations and the provider's statements to them. This document has been checked and approved by the attending provider.

## 2016-07-10 ENCOUNTER — Encounter: Payer: Self-pay | Admitting: Radiation Oncology

## 2016-07-10 NOTE — Progress Notes (Signed)
Mr. Cooprider presents for follow up of radiation completed 04/19/15 to his Larynx.    Pain issues, if any: No throat pain. He does have chronic back pain which her will take tramadol for occasionally.  Using a feeding tube?: No Weight changes, if any:  Wt Readings from Last 3 Encounters:  07/13/16 223 lb 1.6 oz (101.2 kg)  01/06/16 218 lb 8 oz (99.1 kg)  10/13/15 210 lb (95.3 kg)   Swallowing issues, if any: He has no difficulty swallowing.  Smoking or chewing tobacco? no Using fluoride trays daily? N/A Last ENT visit was on: Dr. Redmond Baseman 04/16/16. He will see him again in about 3 months.  Other notable issues, if any:  He tells me his voice has changed since his radiation treatment, he feels like it is deeper.  When he yawns very wide he feels like "something is bending inside his throat". It is not painful.   BP 138/73   Pulse 70   Temp 97.5 F (36.4 C)   Ht 5\' 10"  (1.778 m)   Wt 223 lb 1.6 oz (101.2 kg)   SpO2 94%   BMI 32.01 kg/m

## 2016-07-13 ENCOUNTER — Ambulatory Visit
Admission: RE | Admit: 2016-07-13 | Discharge: 2016-07-13 | Disposition: A | Discharge status: Home / Self Care | Payer: Medicare Other | Source: Ambulatory Visit | Attending: Radiation Oncology | Admitting: Radiation Oncology

## 2016-07-13 ENCOUNTER — Encounter: Payer: Self-pay | Admitting: Radiation Oncology

## 2016-07-13 VITALS — BP 138/73 | HR 70 | Temp 97.5°F | Ht 70.0 in | Wt 223.1 lb

## 2016-07-13 DIAGNOSIS — G8929 Other chronic pain: Secondary | ICD-10-CM | POA: Diagnosis not present

## 2016-07-13 DIAGNOSIS — D020 Carcinoma in situ of larynx: Secondary | ICD-10-CM | POA: Insufficient documentation

## 2016-07-13 DIAGNOSIS — E039 Hypothyroidism, unspecified: Secondary | ICD-10-CM | POA: Insufficient documentation

## 2016-07-13 DIAGNOSIS — Z923 Personal history of irradiation: Secondary | ICD-10-CM | POA: Insufficient documentation

## 2016-07-13 DIAGNOSIS — Z720 Tobacco use: Secondary | ICD-10-CM | POA: Insufficient documentation

## 2016-07-13 HISTORY — DX: Reserved for concepts with insufficient information to code with codable children: IMO0002

## 2016-07-13 HISTORY — DX: Reserved for inherently not codable concepts without codable children: IMO0001

## 2016-07-13 MED ORDER — LARYNGOSCOPY SOLUTION RAD-ONC
15.0000 mL | Freq: Once | TOPICAL | Status: AC
  Administered 2016-07-13: 15 mL via TOPICAL
  Filled 2016-07-13: qty 15

## 2016-07-13 NOTE — Progress Notes (Signed)
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  Pain issues, if any: No throat pain. He does have chronic back pain which he will take tramadol for occasionally.  Using a feeding tube?: No Weight changes, if any:  Wt Readings from Last 3 Encounters:  07/13/16 223 lb 1.6 oz (101.2 kg)  01/06/16 218 lb 8 oz (99.1 kg)  10/13/15 210 lb (95.3 kg)   Swallowing issues, if any: He has no difficulty swallowing.  Smoking or chewing tobacco? no Using fluoride trays daily? N/A Last ENT visit was on: Dr. Redmond [name] 04/16/16. He will see him again in about 3 months.  Other notable issues, if any:  He reports a deeper voice after radiation treatment. When he yawns very wide he feels some pain.  ALLERGIES:  has No Known Allergies.  Meds: Current Outpatient Prescriptions  Medication Sig Dispense Refill  . aspirin 81 MG tablet Take 81 mg by mouth daily.    . diclofenac sodium (VOLTAREN) 1 % GEL Apply 2 g topically as needed (pain).     Marland Kitchen doxazosin (CARDURA) 2 MG tablet Take 2 mg by mouth daily.    Marland Kitchen levothyroxine (SYNTHROID, LEVOTHROID) 150 MCG tablet Take 150 mcg by mouth daily before breakfast.    . Liniments (SALONPAS EX) Apply topically as needed. patches    . Loratadine (CLARITIN PO) Take 10 mg by mouth as needed (allergies).     . MEGARED OMEGA-3 KRILL OIL 500 MG CAPS Take 1 tablet by mouth daily.     Marland Kitchen omeprazole (PRILOSEC) 20 MG capsule Take 20 mg by mouth daily.    . propranolol  (INDERAL) 80 MG tablet Take 80 mg by mouth daily.     . ranitidine (ZANTAC) 150 MG tablet Take 150 mg by mouth daily.    . simvastatin (ZOCOR) 40 MG tablet Take 40 mg by mouth daily.    . traMADol (ULTRAM) 50 MG tablet Take by mouth every 6 (six) hours as needed for severe pain.     . Cholecalciferol (VITAMIN D3) 1000 units CAPS Take by mouth.    . fluticasone (FLONASE) 50 MCG/ACT nasal spray 2 sprays by Each Nare route daily.     No current facility-administered medications for this encounter.     Physical Findings: The patient is in no acute distress. Patient is alert and oriented. Wt Readings from Last 3 Encounters:  07/13/16 223 lb 1.6 oz (101.2 kg)  01/06/16 218 lb 8 oz (99.1 kg)  10/13/15 210 lb (95.3 kg)    height is 5\' 10"  (1.778 m) and weight is 223 lb 1.6 oz (101.2 kg). His temperature is 97.5 F (36.4 C). His blood pressure is 138/73 and his pulse is 70. His oxygen saturation is 94%.   Skin has healed well in the treatment area. No palpable cervical or supraclavicular adenopathy. Lungs are clear to auscultation bilaterally. Heart has regular rate and rhythm, no  murmurs. Oropharynx is clear.  PROCEDURE NOTE: After anesthetizing the nasal cavity with topical lidocaine and phenylephrine, the flexible endoscope was introduced and passed through the nasal cavity On laryngoscopy exam, pharynx and larynx show no tumor. His true cords are symmetrically mobile. The right true vocal cord has a patch of mild erythema that is not of clinical concern.  Lab Findings: Lab Results  Component Value Date   WBC 7.0 04/06/2015   HGB 13.3 04/06/2015   HCT 41.7 04/06/2015   MCV 89.7 04/06/2015   PLT 146 (L) 04/06/2015    No results found for: TSH  Radiographic Findings: No results found.   Impression/Plan:   1) Head and Neck Cancer Status: No evidence of disease.  2) Nutritional Status: The patient is eating well with good nutrition. Gaining weight.  3) Risk Factors:   The patient  has been educated about risk factors including tobacco abuse; they understand that avoidance of  tobacco is important to prevent recurrences as well as other cancers.  4) Social: No active social issues to address at this time.  5) Hypothyroidism: preceded RT; Continued management with PCP.  6) On laryngoscopy, the erythema of the right true vocal cord could be attributed to his history of acid reflux. The patient is taking Prilosec.  Continue Prilosec.  7) The patient is scheduled to see Dr. Redmond [name] in December.Follow up in radiation oncology in April. I gave the patient a follow up card. The patient knows to contact me if he has any questions or concerns.  _____________________________________   Eppie Gibson, MD  This document serves as a record of services personally performed by Eppie Gibson, MD. It was created on her behalf by Darcus Austin, a trained medical scribe. The creation of this record is based on the scribe's personal observations and the provider's statements to them. This document has been checked and approved by the attending provider.

## 2016-07-31 DIAGNOSIS — E559 Vitamin D deficiency, unspecified: Secondary | ICD-10-CM | POA: Insufficient documentation

## 2016-09-05 ENCOUNTER — Telehealth: Payer: Self-pay | Admitting: Family

## 2016-09-05 NOTE — Telephone Encounter (Signed)
Pt already sch appt with Dr.Hung office

## 2016-11-14 DIAGNOSIS — I6523 Occlusion and stenosis of bilateral carotid arteries: Secondary | ICD-10-CM | POA: Insufficient documentation

## 2016-11-14 DIAGNOSIS — I70203 Unspecified atherosclerosis of native arteries of extremities, bilateral legs: Secondary | ICD-10-CM | POA: Insufficient documentation

## 2017-02-13 NOTE — Progress Notes (Signed)
Mr. Helm presents for follow up of radiation completed 04/19/15 to his Larynx.   Pain issues, if any: He tells me that when he yawns it feels like his throat is collapsing. He also reports pain to his lower back which has been chronic. He has Left Shoulder pain which comes and goes. He recently had an injection which did not help.  Using a feeding tube?: No Weight changes, if any: he has had an intentional weight loss since the last time he was seen.  Wt Readings from Last 3 Encounters:  02/15/17 208 lb 6.4 oz (94.5 kg)  07/13/16 223 lb 1.6 oz (101.2 kg)  01/06/16 218 lb 8 oz (99.1 kg)   Swallowing issues, if any: He denies difficulty swallowing and is not modifying his diet.  Smoking or chewing tobacco? No Using fluoride trays daily? N/A.  Last ENT visit was on: Dr. Redmond Baseman on 10/05/16 for Vocal Cord Carcinoma follow up and on 11/29/16 for complaints of ear pressure. There is not another appointment scheduled at this time.  Other notable issues, if any:  He reports some recent sinus drainage, and needs to clear his throat frequently.   BP 129/65   Pulse 61   Temp 97.8 F (36.6 C)   Ht 5\' 10"  (1.778 m)   Wt 208 lb 6.4 oz (94.5 kg)   SpO2 95% Comment: room air  BMI 29.90 kg/m

## 2017-02-15 ENCOUNTER — Encounter: Payer: Self-pay | Admitting: Radiation Oncology

## 2017-02-15 ENCOUNTER — Ambulatory Visit
Admission: RE | Admit: 2017-02-15 | Discharge: 2017-02-15 | Disposition: A | Discharge status: Home / Self Care | Payer: Medicare Other | Source: Ambulatory Visit | Attending: Radiation Oncology | Admitting: Radiation Oncology

## 2017-02-15 VITALS — BP 129/65 | HR 61 | Temp 97.8°F | Ht 70.0 in | Wt 208.4 lb

## 2017-02-15 DIAGNOSIS — R634 Abnormal weight loss: Secondary | ICD-10-CM | POA: Insufficient documentation

## 2017-02-15 DIAGNOSIS — Z7982 Long term (current) use of aspirin: Secondary | ICD-10-CM | POA: Insufficient documentation

## 2017-02-15 DIAGNOSIS — M25512 Pain in left shoulder: Secondary | ICD-10-CM | POA: Diagnosis not present

## 2017-02-15 DIAGNOSIS — Z79899 Other long term (current) drug therapy: Secondary | ICD-10-CM | POA: Diagnosis not present

## 2017-02-15 DIAGNOSIS — G8929 Other chronic pain: Secondary | ICD-10-CM | POA: Insufficient documentation

## 2017-02-15 DIAGNOSIS — Z923 Personal history of irradiation: Secondary | ICD-10-CM | POA: Diagnosis not present

## 2017-02-15 DIAGNOSIS — E039 Hypothyroidism, unspecified: Secondary | ICD-10-CM | POA: Diagnosis not present

## 2017-02-15 DIAGNOSIS — M545 Low back pain: Secondary | ICD-10-CM | POA: Insufficient documentation

## 2017-02-15 DIAGNOSIS — D020 Carcinoma in situ of larynx: Secondary | ICD-10-CM

## 2017-02-15 MED ORDER — LARYNGOSCOPY SOLUTION RAD-ONC
15.0000 mL | Freq: Once | TOPICAL | Status: AC
  Administered 2017-02-15: 15 mL via TOPICAL
  Filled 2017-02-15: qty 15

## 2017-02-15 NOTE — Progress Notes (Signed)
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 Calabrese presents for follow up of radiation completed 04/19/2015 to his Larynx. He is accompanied by his wife and son.  On review of systems, the patient reports chronic pain to his lower back, as well as left shoulder pain which comes and goes. He recently had an injection, which he reports did not help. The patient reports that when he yawns he feels like his throat is "collapsing (stable over past 2 years)." He denies difficulty swallowing. The patient reports intentional weight loss since his last follow up visit; he has lost 10 lbs. He reports his PCP follows his thyroid function and manages his medication dosage as needed for weight loss. The patient reports recent sinus drainage for which he must clear his throat frequently.   The patient is not using tobacco products. His last ENT appointment with Dr. Redmond Baseman was 11/29/16 for complaints of ear pressure.  ALLERGIES:  has No Known Allergies.  Meds: Current Outpatient Prescriptions  Medication Sig Dispense Refill  . aspirin 81 MG tablet Take 81 mg by mouth daily.    . Cholecalciferol (VITAMIN D3) 1000 units CAPS Take by mouth.    . diclofenac sodium (VOLTAREN) 1 % GEL Apply 2 g topically as needed (pain).     Marland Kitchen doxazosin (CARDURA) 2 MG tablet Take 2 mg by mouth daily.    Marland Kitchen HYDROcodone-acetaminophen (NORCO/VICODIN) 5-325 MG tablet Take by mouth.    . levothyroxine (SYNTHROID,  LEVOTHROID) 150 MCG tablet Take 150 mcg by mouth daily before breakfast.    . Liniments (SALONPAS EX) Apply topically as needed. patches    . Loratadine (CLARITIN PO) Take 10 mg by mouth as needed (allergies).     . MEGARED OMEGA-3 KRILL OIL 500 MG CAPS Take 1 tablet by mouth daily.     Marland Kitchen omeprazole (PRILOSEC) 20 MG capsule Take 20 mg by mouth daily.    . propranolol (INDERAL) 80 MG tablet Take 80 mg by mouth daily.     . ranitidine (ZANTAC) 150 MG tablet Take 150 mg by mouth daily.    . simvastatin (ZOCOR) 40 MG tablet Take 40 mg by mouth daily.    . traMADol (ULTRAM) 50 MG tablet Take by mouth every 6 (six) hours as needed for severe pain.      No current facility-administered medications for this encounter.     Physical Findings: The patient is in no acute distress. Patient is alert and oriented. Wt Readings from Last 3 Encounters:  02/15/17 208 lb 6.4 oz (94.5 kg)  07/13/16 223 lb 1.6 oz (101.2 kg)  01/06/16 218 lb 8 oz (99.1 kg)    height is 5\' 10"  (1.778 m) and weight is 208 lb 6.4 oz (94.5 kg). His temperature is 97.8 F (36.6 C). His blood pressure is 129/65 and his pulse is 61. His oxygen saturation is 95%.  Gen: NAD, well appearing No  palpable masses in the cervical or supraclavicular neck.  HEENT: No oral cavity or oropharyngeal lesions.  Heart regular in rate and rhythm, no murmurs.  Lungs are clear to ausculation bilaterally. Psych: appropriate affect , insight intact  PROCEDURE NOTE: After obtaining consent and anesthetizing the nasal cavity with topical lidocaine and phenylephrine, the flexible endoscope was introduced and passed through the nasal cavity. On laryngoscopy exam, there are no lesions in the pharynx or larynx. True cords have no nodules and are symmetrically mobile.  Lab Findings: Lab Results  Component Value Date   WBC 7.0 04/06/2015   HGB 13.3 04/06/2015   HCT 41.7 04/06/2015   MCV 89.7 04/06/2015   PLT 146 (L) 04/06/2015    No results found  for: TSH  Radiographic Findings: No results found.   Impression/Plan:   1) Head and Neck Cancer Status: No evidence of disease.   2) Nutritional Status: The patient is eating well with good nutrition. Recent purposeful weight loss of approximately 10 lbs.  3) Risk Factors:   The patient has been educated about risk factors including tobacco abuse; they understand that avoidance of  tobacco is important to prevent recurrences as well as other cancers.   4) Social: No active social issues to address at this time.   5) Hypothyroidism: preceded RT; Continued management with PCP.   The patient should see Dr. Redmond Baseman in 6 months. He prefers to call and schedule this appointment himself. The patient will follow up with me in 1 year. The patient knows to contact me if he has any questions or concerns.  _____________________________________   Eppie Gibson, MD  This document serves as a record of services personally performed by Eppie Gibson, MD. It was created on her behalf by Maryla Morrow, a trained medical scribe. The creation of this record is based on the scribe's personal observations and the provider's statements to them. This document has been checked and approved by the attending provider.

## 2017-04-16 DIAGNOSIS — M75102 Unspecified rotator cuff tear or rupture of left shoulder, not specified as traumatic: Secondary | ICD-10-CM | POA: Insufficient documentation

## 2017-04-16 DIAGNOSIS — M5416 Radiculopathy, lumbar region: Secondary | ICD-10-CM | POA: Insufficient documentation

## 2017-04-16 DIAGNOSIS — M7062 Trochanteric bursitis, left hip: Secondary | ICD-10-CM | POA: Insufficient documentation

## 2018-01-14 ENCOUNTER — Ambulatory Visit: Payer: Medicare Other | Attending: Nurse Practitioner | Admitting: Physical Therapy

## 2018-01-14 ENCOUNTER — Other Ambulatory Visit: Payer: Self-pay

## 2018-01-14 ENCOUNTER — Encounter: Payer: Self-pay | Admitting: Physical Therapy

## 2018-01-14 DIAGNOSIS — M25612 Stiffness of left shoulder, not elsewhere classified: Secondary | ICD-10-CM | POA: Diagnosis present

## 2018-01-14 DIAGNOSIS — M25552 Pain in left hip: Secondary | ICD-10-CM | POA: Insufficient documentation

## 2018-01-14 DIAGNOSIS — M25512 Pain in left shoulder: Secondary | ICD-10-CM | POA: Diagnosis not present

## 2018-01-14 DIAGNOSIS — R293 Abnormal posture: Secondary | ICD-10-CM | POA: Diagnosis present

## 2018-01-14 NOTE — Therapy (Signed)
[place], [place], [address] Phone: [phone]   Fax:  [phone]  Physical Therapy Evaluation  Patient Details  Name: Victor [name] MRN: [id number] Date of Birth: [birth year] Referring Provider: Eldridge Abrahams   Encounter Date: 01/14/2018  PT End of Session - 01/14/18 1532    Visit Number  1    Date for PT Re-Evaluation  03/16/18    PT Start Time  1350    PT Stop Time  1435    PT Time Calculation (min)  45 min    Activity Tolerance  Patient tolerated treatment well    Behavior During Therapy  [facility] for tasks assessed/performed       Past Medical History:  Diagnosis Date  . Arthritis   . Atherosclerosis   . Atherosclerotic PVD with intermittent claudication ([place])   . BPH (benign prostatic hyperplasia)   . Bulging lumbar disc   . Cancer ([place]) 11/12/14   vocal cord  carcinoma in situ   . Carotid bruit   . Chronic kidney disease    chronic stage III  . DDD (degenerative disc disease), lumbar   . Dysphonia   . Dysplasia of true vocal cord   . GERD (gastroesophageal reflux disease)   . H/O carotid atherosclerosis    b/l  . Hyperlipidemia   . Occasional tremors    left hand managed with propranolol  . Peripheral vascular angioplasty status with implants and grafts   . Radiation 03/10/15- 04/18/16   Larynx  . Sleep apnea    hasn't used CPAP in years  . Spondylosis of lumbosacral region   . Thyroid disease     Past Surgical History:  Procedure Laterality Date  . APPENDECTOMY    . MOUTH SURGERY     tooth extraction  . THYROID SURGERY    . TONSILLECTOMY    . vocal cord biopsy  11/12/14   squamous cell carcinoma in situ    There were no vitals filed for this visit.   Subjective Assessment - 01/14/18 1358    Subjective  Patient presents with left shoulder pain for about 3 years.  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  An MRI in 2016 showed significant arthrits and degenerative changes as well as tendontitis, tendonosis and tendonopathy of the RC mms.  Had a cortisone injection about 3 weeks ago    Patient Stated Goals  have less pain, better motion    Currently in Pain?  Yes    Pain Score  0-No pain    Pain Location  Shoulder    Pain Orientation  Left    Pain Descriptors / Indicators  Aching    Pain Type  Chronic pain    Pain Onset  More than a month ago    Pain Frequency  Intermittent    Aggravating Factors   reaching out and back, hanging arm down pain up to 7/10    Pain Relieving Factors  rest, not moving, cortisone injections, and arm support pain can be 0/10    Effect of Pain on Daily Activities  just hurts with certain things         [facility] PT Assessment - 01/14/18 0001      Assessment   Medical Diagnosis  left shoulder pain    Referring Provider  Eldridge Abrahams  Onset Date/Surgical Date  12/17/17    Hand Dominance  Right    Prior Therapy  no      Precautions   Precautions  None      Balance Screen   Has the patient fallen in the past 6 months  No    Has the patient had a decrease in activity level because of a fear of falling?   No    Is the patient reluctant to leave their home because of a fear of falling?   No      Home Environment   Additional Comments  light housework      Prior Function   Level of Independence  Independent    Vocation  Retired    Leisure  reports that he walks for an hour      Posture/Postural Control   Posture Comments  fwd head, rounded shoulders      ROM / Strength   AROM / PROM / Strength  AROM;PROM;Strength      AROM   AROM Assessment Site  Shoulder    Right/Left Shoulder  Left    Left Shoulder Flexion  125 Degrees    Left Shoulder ABduction  120 Degrees    Left Shoulder Internal Rotation  40 Degrees reaches to L2    Left Shoulder External Rotation  65 Degrees      PROM   PROM Assessment Site   Shoulder    Right/Left Shoulder  Left    Left Shoulder Flexion  140 Degrees    Left Shoulder ABduction  135 Degrees    Left Shoulder Internal Rotation  48 Degrees    Left Shoulder External Rotation  75 Degrees very painful with any passive motion ER, horizontal abductio      Strength   Overall Strength Comments  4/5 with some pain      Palpation   Palpation comment  mild tenderness in the left biceps origin and the lateral shoulder      Special Tests   Other special tests  + impingement, negative empty can             Objective measurements completed on examination: See above findings.                PT Short Term Goals - 01/14/18 1544      PT SHORT TERM GOAL #1   Title  independent with initial HEP    Time  2    Period  Weeks    Status  New        PT Long Term Goals - 01/14/18 1545      PT LONG TERM GOAL #1   Title  decrease pain 50%    Time  8    Period  Weeks    Status  New      PT LONG TERM GOAL #2   Title  understand proper posture and body mechanics    Time  8    Period  Weeks    Status  New      PT LONG TERM GOAL #3   Title  increase left shoulder flexion to 130 degrees    Time  8    Period  Weeks    Status  New      PT LONG TERM GOAL #4   Title  increase IR to 50 degrees    Time  8    Period  Weeks    Status  New  Plan - 01/14/18 1541    Clinical Impression Statement  Patient reports years of left shoulder pain, MRI showed arthritic changes and tendonopathies.  He reports no specific cause.  He also has left GT bursitis.  Has had good relief with cortisone injections.  He is tight in the HS and piriformis as well as the ITB.  He has very rounded shoulders, difficult for him to correct.  He has limited ROM of the left shoulder missing 20-30 degrees for all motions compared to the right shoulder.  + impingement test, negative empty can.    Clinical Presentation  Stable    Clinical Decision Making  Moderate     Rehab Potential  Good    PT Frequency  2x / week    PT Duration  8 weeks    PT Treatment/Interventions  ADLs/Self Care Home Management;Electrical Stimulation;Cryotherapy;Iontophoresis 4mg /ml Dexamethasone;Moist Heat;Therapeutic exercise;Therapeutic activities;Neuromuscular re-education;Manual techniques;Patient/family education    PT Next Visit Plan  add scapular stabilization and posture    Consulted and Agree with Plan of Care  Patient       Patient will benefit from skilled therapeutic intervention in order to improve the following deficits and impairments:  Abnormal gait, Decreased range of motion, Difficulty walking, Impaired UE functional use, Increased muscle spasms, Pain, Impaired flexibility, Improper body mechanics, Postural dysfunction, Decreased strength, Decreased mobility  Visit Diagnosis: Acute pain of left shoulder - Plan: PT plan of care cert/re-cert  Stiffness of left shoulder, not elsewhere classified - Plan: PT plan of care cert/re-cert  Pain in left hip - Plan: PT plan of care cert/re-cert  Abnormal posture - Plan: PT plan of care cert/re-cert     Problem List Patient Active Problem List   Diagnosis Date Noted  . 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    Sumner Boast., PT 01/14/2018, 3:50 PM  [place], [place], [address] Phone: [phone]   Fax:  [phone]  Name: Victor [name] MRN: [id number] Date of Birth: 24-Nov-1935

## 2018-01-23 ENCOUNTER — Encounter: Payer: Medicare Other | Admitting: Physical Therapy

## 2018-01-27 ENCOUNTER — Ambulatory Visit: Payer: Medicare Other | Admitting: Physical Therapy

## 2018-01-27 ENCOUNTER — Encounter: Payer: Self-pay | Admitting: Physical Therapy

## 2018-01-27 ENCOUNTER — Encounter: Payer: Medicare Other | Admitting: Physical Therapy

## 2018-01-27 DIAGNOSIS — R293 Abnormal posture: Secondary | ICD-10-CM

## 2018-01-27 DIAGNOSIS — M25512 Pain in left shoulder: Secondary | ICD-10-CM

## 2018-01-27 DIAGNOSIS — M25612 Stiffness of left shoulder, not elsewhere classified: Secondary | ICD-10-CM

## 2018-01-27 DIAGNOSIS — M25552 Pain in left hip: Secondary | ICD-10-CM

## 2018-01-27 NOTE — Therapy (Signed)
[place], [place], [address] Phone: [phone]   Fax:  [phone]  Physical Therapy Treatment  Patient Details  Name: Victor [name] MRN: [id number] Date of Birth: 1936/10/12 Referring Provider: Eldridge Abrahams   Encounter Date: 01/27/2018  PT End of Session - 01/27/18 1117    Visit Number  2    Date for PT Re-Evaluation  03/16/18    PT Start Time  0930    PT Stop Time  1030    PT Time Calculation (min)  60 min    Activity Tolerance  Patient tolerated treatment well    Behavior During Therapy  [facility] for tasks assessed/performed       Past Medical History:  Diagnosis Date  . Arthritis   . Atherosclerosis   . Atherosclerotic PVD with intermittent claudication ([place])   . BPH (benign prostatic hyperplasia)   . Bulging lumbar disc   . Cancer ([place]) 11/12/14   vocal cord  carcinoma in situ   . Carotid bruit   . Chronic kidney disease    chronic stage III  . DDD (degenerative disc disease), lumbar   . Dysphonia   . Dysplasia of true vocal cord   . GERD (gastroesophageal reflux disease)   . H/O carotid atherosclerosis    b/l  . Hyperlipidemia   . Occasional tremors    left hand managed with propranolol  . Peripheral vascular angioplasty status with implants and grafts   . Radiation 03/10/15- 04/18/16   Larynx  . Sleep apnea    hasn't used CPAP in years  . Spondylosis of lumbosacral region   . Thyroid disease     Past Surgical History:  Procedure Laterality Date  . APPENDECTOMY    . MOUTH SURGERY     tooth extraction  . THYROID SURGERY    . TONSILLECTOMY    . vocal cord biopsy  11/12/14   squamous cell carcinoma in situ    There were no vitals filed for this visit.  Subjective Assessment - 01/27/18 0929    Subjective  Patient reports that he may feel a little better, still hurting, reports that the hip stretches may help the most so far, the shoulder still hurts    Currently in Pain?   Yes    Pain Score  1     Pain Location  Shoulder    Pain Orientation  Left                No data recorded       [facility] Adult PT Treatment/Exercise - 01/27/18 0001      Exercises   Exercises  Shoulder      Shoulder Exercises: Seated   Extension  20 reps;Weights    Extension Weight (lbs)  3    Extension Limitations  a lot of cues for posture    Row  20 reps;Weights    Row Weight (lbs)  5    Row Limitations  cues for posture      Shoulder Exercises: Standing   External Rotation  20 reps;Theraband    Theraband Level (Shoulder External Rotation)  Level 2 (Red)    External Rotation Weight (lbs)  back against wall    Other Standing Exercises  door stretch    Other Standing Exercises  back to wall overhead weighted ball lift      Shoulder Exercises: ROM/Strengthening   UBE (Upper Arm Bike)  level 4 x 4 minutes  Lat Pull  2 plate;20 reps    Lat Pull Limitations  cues for retraction    Cybex Row  2 plate;20 reps    Cybex Row Limitations  a lot of cues for retraction      Shoulder Exercises: IT sales professional  3 reps;20 seconds    Star Gazer Stretch  3 reps;20 seconds    Other Shoulder Stretches  thoracic stretch with him supine over a pool noodle    Other Shoulder Stretches  LE stertches to include HS, piriformis and ITB bilaterally      Modalities   Modalities  Electrical Stimulation;Moist Heat      Moist Heat Therapy   Number Minutes Moist Heat  15 Minutes    Moist Heat Location  Lumbar Spine      Electrical Stimulation   Electrical Stimulation Location  L/S area    Electrical Stimulation Action  IFC    Electrical Stimulation Parameters  supine    Electrical Stimulation Goals  Pain               PT Short Term Goals - 01/27/18 1204      PT SHORT TERM GOAL #1   Title  independent with initial HEP    Status  Achieved        PT Long Term Goals - 01/14/18 1545      PT LONG TERM GOAL #1   Title  decrease pain 50%    Time  8     Period  Weeks    Status  New      PT LONG TERM GOAL #2   Title  understand proper posture and body mechanics    Time  8    Period  Weeks    Status  New      PT LONG TERM GOAL #3   Title  increase left shoulder flexion to 130 degrees    Time  8    Period  Weeks    Status  New      PT LONG TERM GOAL #4   Title  increase IR to 50 degrees    Time  8    Period  Weeks    Status  New            Plan - 01/27/18 1202    Clinical Impression Statement  Patine is dealing with significant postural issues, when I put him with his back to the wall he cannot have his head on the wall due to the kyphotic posture.  he is very tight anterior shoulder, very tight HSand ITB.   No increase of pain with the exercises we did today, just some LBP with the stretches.    PT Next Visit Plan  add scapular stabilization and posture    Consulted and Agree with Plan of Care  Patient       Patient will benefit from skilled therapeutic intervention in order to improve the following deficits and impairments:  Abnormal gait, Decreased range of motion, Difficulty walking, Impaired UE functional use, Increased muscle spasms, Pain, Impaired flexibility, Improper body mechanics, Postural dysfunction, Decreased strength, Decreased mobility  Visit Diagnosis: Acute pain of left shoulder  Stiffness of left shoulder, not elsewhere classified  Pain in left hip  Abnormal posture     Problem List Patient Active Problem List   Diagnosis Date Noted  . 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

## 2018-01-30 ENCOUNTER — Encounter: Payer: Self-pay | Admitting: Physical Therapy

## 2018-01-30 ENCOUNTER — Ambulatory Visit: Payer: Medicare Other | Admitting: Physical Therapy

## 2018-01-30 ENCOUNTER — Encounter: Payer: Medicare Other | Admitting: Physical Therapy

## 2018-01-30 DIAGNOSIS — M25552 Pain in left hip: Secondary | ICD-10-CM

## 2018-01-30 DIAGNOSIS — M25512 Pain in left shoulder: Secondary | ICD-10-CM | POA: Diagnosis not present

## 2018-01-30 DIAGNOSIS — R293 Abnormal posture: Secondary | ICD-10-CM

## 2018-01-30 DIAGNOSIS — M25612 Stiffness of left shoulder, not elsewhere classified: Secondary | ICD-10-CM

## 2018-01-30 NOTE — Therapy (Signed)
[place], [place], [address] Phone: [phone]   Fax:  [phone]  Physical Therapy Treatment  Patient Details  Name: ADRIEL KESSEN MRN: [id number] Date of Birth: 1936/04/08 Referring Provider: Eldridge Abrahams   Encounter Date: 01/30/2018  PT End of Session - 01/30/18 1510    Visit Number  3    Date for PT Re-Evaluation  03/16/18    PT Start Time  1444    PT Stop Time  1533    PT Time Calculation (min)  49 min    Activity Tolerance  Patient tolerated treatment well    Behavior During Therapy  [facility] for tasks assessed/performed       Past Medical History:  Diagnosis Date  . Arthritis   . Atherosclerosis   . Atherosclerotic PVD with intermittent claudication ([place])   . BPH (benign prostatic hyperplasia)   . Bulging lumbar disc   . Cancer ([place]) 11/12/14   vocal cord  carcinoma in situ   . Carotid bruit   . Chronic kidney disease    chronic stage III  . DDD (degenerative disc disease), lumbar   . Dysphonia   . Dysplasia of true vocal cord   . GERD (gastroesophageal reflux disease)   . H/O carotid atherosclerosis    b/l  . Hyperlipidemia   . Occasional tremors    left hand managed with propranolol  . Peripheral vascular angioplasty status with implants and grafts   . Radiation 03/10/15- 04/18/16   Larynx  . Sleep apnea    hasn't used CPAP in years  . Spondylosis of lumbosacral region   . Thyroid disease     Past Surgical History:  Procedure Laterality Date  . APPENDECTOMY    . MOUTH SURGERY     tooth extraction  . THYROID SURGERY    . TONSILLECTOMY    . vocal cord biopsy  11/12/14   squamous cell carcinoma in situ    There were no vitals filed for this visit.  Subjective Assessment - 01/30/18 1445    Subjective  Patient reports a fall on Tuesday, reports he does not think he hurt the shoulder.    Currently in Pain?  Yes    Pain Score  2     Pain Location  Shoulder    Pain  Orientation  Left    Aggravating Factors   certain posistions                No data recorded       [facility] Adult PT Treatment/Exercise - 01/30/18 0001      Shoulder Exercises: Standing   External Rotation  20 reps;Theraband    Theraband Level (Shoulder External Rotation)  Level 2 (Red)    External Rotation Weight (lbs)  back against wall    Other Standing Exercises  door stretch    Other Standing Exercises  back to wall overhead weighted ball lift      Modalities   Modalities  Ultrasound      Moist Heat Therapy   Number Minutes Moist Heat  15 Minutes    Moist Heat Location  Lumbar Spine      Electrical Stimulation   Electrical Stimulation Location  L/S area, left shoulder    Electrical Stimulation Action  premod    Electrical Stimulation Parameters  supine    Electrical Stimulation Goals  Pain      Ultrasound   Ultrasound Location  left shoulder  Ultrasound Parameters  100% 1.5w/cm2    Ultrasound Goals  Pain               PT Short Term Goals - 01/27/18 1204      PT SHORT TERM GOAL #1   Title  independent with initial HEP    Status  Achieved        PT Long Term Goals - 01/14/18 1545      PT LONG TERM GOAL #1   Title  decrease pain 50%    Time  8    Period  Weeks    Status  New      PT LONG TERM GOAL #2   Title  understand proper posture and body mechanics    Time  8    Period  Weeks    Status  New      PT LONG TERM GOAL #3   Title  increase left shoulder flexion to 130 degrees    Time  8    Period  Weeks    Status  New      PT LONG TERM GOAL #4   Title  increase IR to 50 degrees    Time  8    Period  Weeks    Status  New            Plan - 01/30/18 1510    Clinical Impression Statement  Patient reports that the heat on the back really helped.  He reported after the [place] that it really helped.    PT Next Visit Plan  may add scap stabilization, see if the [place] and heat helped    Consulted and Agree with Plan of Care   Patient       Patient will benefit from skilled therapeutic intervention in order to improve the following deficits and impairments:  Abnormal gait, Decreased range of motion, Difficulty walking, Impaired UE functional use, Increased muscle spasms, Pain, Impaired flexibility, Improper body mechanics, Postural dysfunction, Decreased strength, Decreased mobility  Visit Diagnosis: Acute pain of left shoulder  Stiffness of left shoulder, not elsewhere classified  Pain in left hip  Abnormal posture     Problem List Patient Active Problem List   Diagnosis Date Noted  . 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

## 2018-02-19 ENCOUNTER — Encounter: Payer: Self-pay | Admitting: Physical Therapy

## 2018-02-19 ENCOUNTER — Ambulatory Visit: Payer: Medicare Other | Attending: Nurse Practitioner | Admitting: Physical Therapy

## 2018-02-19 DIAGNOSIS — R293 Abnormal posture: Secondary | ICD-10-CM | POA: Insufficient documentation

## 2018-02-19 DIAGNOSIS — M25552 Pain in left hip: Secondary | ICD-10-CM | POA: Insufficient documentation

## 2018-02-19 DIAGNOSIS — M25512 Pain in left shoulder: Secondary | ICD-10-CM | POA: Diagnosis present

## 2018-02-19 DIAGNOSIS — M25612 Stiffness of left shoulder, not elsewhere classified: Secondary | ICD-10-CM | POA: Diagnosis present

## 2018-02-19 NOTE — Therapy (Signed)
[place], [place], [address] Phone: [phone]   Fax:  [phone]  Physical Therapy Treatment  Patient Details  Name: PHI AVANS MRN: [id number] Date of Birth: 12/14/1935 Referring Provider: Eldridge Abrahams   Encounter Date: 02/19/2018  PT End of Session - 02/19/18 1457    Visit Number  4    Date for PT Re-Evaluation  03/16/18    PT Start Time  1315    PT Stop Time  1415    PT Time Calculation (min)  60 min    Activity Tolerance  Patient tolerated treatment well    Behavior During Therapy  [facility] for tasks assessed/performed       Past Medical History:  Diagnosis Date  . Arthritis   . Atherosclerosis   . Atherosclerotic PVD with intermittent claudication ([place])   . BPH (benign prostatic hyperplasia)   . Bulging lumbar disc   . Cancer ([place]) 11/12/14   vocal cord  carcinoma in situ   . Carotid bruit   . Chronic kidney disease    chronic stage III  . DDD (degenerative disc disease), lumbar   . Dysphonia   . Dysplasia of true vocal cord   . GERD (gastroesophageal reflux disease)   . H/O carotid atherosclerosis    b/l  . Hyperlipidemia   . Occasional tremors    left hand managed with propranolol  . Peripheral vascular angioplasty status with implants and grafts   . Radiation 03/10/15- 04/18/16   Larynx  . Sleep apnea    hasn't used CPAP in years  . Spondylosis of lumbosacral region   . Thyroid disease     Past Surgical History:  Procedure Laterality Date  . APPENDECTOMY    . MOUTH SURGERY     tooth extraction  . THYROID SURGERY    . TONSILLECTOMY    . vocal cord biopsy  11/12/14   squamous cell carcinoma in situ    There were no vitals filed for this visit.  Subjective Assessment - 02/19/18 1332    Subjective  Patient was away the past two weeks on a [place].  He reports that the shoulder fared pretty well, but that his back and lower body really ached.  Pain a 6/10 with  walking and standing    Currently in Pain?  Yes    Pain Score  4     Pain Location  Back    Pain Orientation  Lower    Aggravating Factors   standeing and walking increase back pain, reaching out and back increases the left shoulder pain                       [facility] Adult PT Treatment/Exercise - 02/19/18 0001      Self-Care   Self-Care  Other Self-Care Comments    Other Self-Care Comments   patient brought in a TENS unit he bought off linme, showed him how to use safely, he verbalized understanding      Shoulder Exercises: Supine   Other Supine Exercises  feet on ball K2C, trunk rotation and small bridges      Shoulder Exercises: Standing   Other Standing Exercises  door stretch      Shoulder Exercises: ROM/Strengthening   Nustep  level 5 x 5 minutes    Cybex Row  2 plate;20 reps    Cybex Row Limitations  a lot of cues for retraction  Shoulder Exercises: Stretch   Corner Stretch  3 reps;20 seconds    Star Gazer Stretch  3 reps;20 seconds    Other Shoulder Stretches  thoracic stretch with him supine over a pool noodle    Other Shoulder Stretches  LE stertches to include HS, piriformis and ITB bilaterally and calves      Moist Heat Therapy   Number Minutes Moist Heat  15 Minutes    Moist Heat Location  Lumbar Spine      Electrical Stimulation   Electrical Stimulation Location  lumbar     Electrical Stimulation Action  IFC    Electrical Stimulation Parameters  supine    Electrical Stimulation Goals  Pain               PT Short Term Goals - 01/27/18 1204      PT SHORT TERM GOAL #1   Title  independent with initial HEP    Status  Achieved        PT Long Term Goals - 02/19/18 1502      PT LONG TERM GOAL #1   Title  decrease pain 50%    Status  On-going      PT LONG TERM GOAL #2   Title  understand proper posture and body mechanics    Status  On-going      PT LONG TERM GOAL #3   Title  increase left shoulder flexion to 130 degrees     Status  Partially Met            Plan - 02/19/18 1458    Clinical Impression Statement  Patient was on vacation the past two weeks.  He reports that the shoulder did okay but the back really bothered him with any standing or walking.  He is very tight in the LE's, he is weak in the core.  He has very poor posture and needs cues to hold good posture and do correct motions especially scapular retraction    PT Next Visit Plan  would like to continue working on flexibility of shoulder and LE's, work on core and scapular stabilization    Consulted and Agree with Plan of Care  Patient       Patient will benefit from skilled therapeutic intervention in order to improve the following deficits and impairments:  Abnormal gait, Decreased range of motion, Difficulty walking, Impaired UE functional use, Increased muscle spasms, Pain, Impaired flexibility, Improper body mechanics, Postural dysfunction, Decreased strength, Decreased mobility  Visit Diagnosis: Acute pain of left shoulder  Stiffness of left shoulder, not elsewhere classified  Pain in left hip  Abnormal posture     Problem List Patient Active Problem List   Diagnosis Date Noted  . 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

## 2018-02-26 ENCOUNTER — Ambulatory Visit: Payer: Medicare Other | Admitting: Physical Therapy

## 2018-02-26 ENCOUNTER — Encounter: Payer: Self-pay | Admitting: Physical Therapy

## 2018-02-26 DIAGNOSIS — M25512 Pain in left shoulder: Secondary | ICD-10-CM

## 2018-02-26 DIAGNOSIS — M25552 Pain in left hip: Secondary | ICD-10-CM

## 2018-02-26 DIAGNOSIS — M25612 Stiffness of left shoulder, not elsewhere classified: Secondary | ICD-10-CM

## 2018-02-26 DIAGNOSIS — R293 Abnormal posture: Secondary | ICD-10-CM

## 2018-02-26 NOTE — Therapy (Signed)
[facility] [place], [place], [address] Phone: [phone]   Fax:  [phone]  Physical Therapy Treatment  Patient Details  Name: Victor [name] MRN: [id number] Date of Birth: 12/03/1935 Referring Provider: Eldridge Abrahams   Encounter Date: 02/26/2018  PT End of Session - 02/26/18 1247    Visit Number  5    Date for PT Re-Evaluation  03/16/18    PT Start Time  1100    PT Stop Time  1146    PT Time Calculation (min)  46 min    Activity Tolerance  Patient tolerated treatment well    Behavior During Therapy  [facility] for tasks assessed/performed       Past Medical History:  Diagnosis Date  . Arthritis   . Atherosclerosis   . Atherosclerotic PVD with intermittent claudication ([place])   . BPH (benign prostatic hyperplasia)   . Bulging lumbar disc   . Cancer ([place]) 11/12/14   vocal cord  carcinoma in situ   . Carotid bruit   . Chronic kidney disease    chronic stage III  . DDD (degenerative disc disease), lumbar   . Dysphonia   . Dysplasia of true vocal cord   . GERD (gastroesophageal reflux disease)   . H/O carotid atherosclerosis    b/l  . Hyperlipidemia   . Occasional tremors    left hand managed with propranolol  . Peripheral vascular angioplasty status with implants and grafts   . Radiation 03/10/15- 04/18/16   Larynx  . Sleep apnea    hasn't used CPAP in years  . Spondylosis of lumbosacral region   . Thyroid disease     Past Surgical History:  Procedure Laterality Date  . APPENDECTOMY    . MOUTH SURGERY     tooth extraction  . THYROID SURGERY    . TONSILLECTOMY    . vocal cord biopsy  11/12/14   squamous cell carcinoma in situ    There were no vitals filed for this visit.  Subjective Assessment - 02/26/18 1059    Subjective  Patient reports that he is still having back pain at times and reports that the shoulder is about the same.    Currently in Pain?  Yes    Pain Score  3     Pain  Location  Shoulder    Pain Orientation  Left    Pain Descriptors / Indicators  Aching;Sore    Aggravating Factors   reaching out, also reports pain in the left hip due to bursitis                       [facility] Adult PT Treatment/Exercise - 02/26/18 0001      Shoulder Exercises: Supine   Other Supine Exercises  feet on ball K2C, trunk rotation and small bridges      Shoulder Exercises: Standing   Other Standing Exercises  door stretch    Other Standing Exercises  back to wall overhead weighted ball lift      Shoulder Exercises: ROM/Strengthening   Nustep  level 5 x 5 minutes    Lat Pull  2 plate;20 reps    Lat Pull Limitations  cues for retraction    Cybex Row  2 plate;20 reps    Cybex Row Limitations  a lot of cues for retraction      Shoulder Exercises: Stretch   Corner Stretch  3 reps;20 seconds    Other  Shoulder Stretches  thoracic stretch with him supine over a pool noodle    Other Shoulder Stretches  LE stertches to include HS, piriformis and ITB bilaterally and calves      Modalities   Modalities  Iontophoresis      Iontophoresis   Type of Iontophoresis  Dexamethasone    Location  left GT area    Dose  61m dose    Time  4 hour patch      Manual Therapy   Manual Therapy  Soft tissue mobilization    Soft tissue mobilization  rolling of the ITB               PT Short Term Goals - 01/27/18 1204      PT SHORT TERM GOAL #1   Title  independent with initial HEP    Status  Achieved        PT Long Term Goals - 02/26/18 1250      PT LONG TERM GOAL #1   Title  decrease pain 50%    Status  On-going      PT LONG TERM GOAL #2   Title  understand proper posture and body mechanics    Status  Partially Met      PT LONG TERM GOAL #4   Title  increase IR to 50 degrees    Status  Partially Met            Plan - 02/26/18 1247    Clinical Impression Statement  Patient continues to have left GT area pain and shoulder pain, he reports that  he has better motions, he is extremely tight in the ITB.  He is having trouble trying to do the stretches at home, tried ionto on the hip today    PT Next Visit Plan  would like to continue working on flexibility of shoulder and LE's, work on core and scapular stabilization    Consulted and Agree with Plan of Care  Patient       Patient will benefit from skilled therapeutic intervention in order to improve the following deficits and impairments:  Abnormal gait, Decreased range of motion, Difficulty walking, Impaired UE functional use, Increased muscle spasms, Pain, Impaired flexibility, Improper body mechanics, Postural dysfunction, Decreased strength, Decreased mobility  Visit Diagnosis: Acute pain of left shoulder  Stiffness of left shoulder, not elsewhere classified  Pain in left hip  Abnormal posture     Problem List Patient Active Problem List   Diagnosis Date Noted  . 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    ASumner Boast, PT 02/26/2018, 12:51 PM  [address] [address] [id number] [address]Phone: [phone]  Fax:  [phone] Name: Victor WEIGELMRN: [id number] of Birth: 4June 14, 1937

## 2018-02-28 ENCOUNTER — Ambulatory Visit: Payer: Self-pay | Admitting: Radiation Oncology

## 2018-03-05 DIAGNOSIS — D499 Neoplasm of unspecified behavior of unspecified site: Secondary | ICD-10-CM

## 2018-03-05 HISTORY — DX: Neoplasm of unspecified behavior of unspecified site: D49.9

## 2018-03-05 NOTE — Progress Notes (Signed)
  Mr. Economou presents for follow up of radiation completed 04/19/15 to his Larynx.   Pain issues, if any: He denies pain.  Using a feeding tube?: N/A Weight changes, if any:  Wt Readings from Last 3 Encounters:  03/07/18 211 lb 12.8 oz (96.1 kg)  02/15/17 208 lb 6.4 oz (94.5 kg)  07/13/16 223 lb 1.6 oz (101.2 kg)   Swallowing issues, if any: He denies.  Smoking or chewing tobacco? No Using fluoride trays daily? N/A Last ENT visit was on: Dr. Redmond Baseman 08/19/17 Other notable issues, if any:   BP (!) 127/47   Pulse 62   Temp 98.4 F (36.9 C)   Resp 18   Ht 5\' 10"  (1.778 m)   Wt 211 lb 12.8 oz (96.1 kg)   SpO2 95% Comment: room air  BMI 30.39 kg/m

## 2018-03-07 ENCOUNTER — Ambulatory Visit
Admission: RE | Admit: 2018-03-07 | Discharge: 2018-03-07 | Disposition: A | Discharge status: Home / Self Care | Payer: Medicare Other | Source: Ambulatory Visit | Attending: Radiation Oncology | Admitting: Radiation Oncology

## 2018-03-07 ENCOUNTER — Other Ambulatory Visit: Payer: Self-pay

## 2018-03-07 ENCOUNTER — Encounter: Payer: Self-pay | Admitting: Radiation Oncology

## 2018-03-07 VITALS — BP 127/47 | HR 62 | Temp 98.4°F | Resp 18 | Ht 70.0 in | Wt 211.8 lb

## 2018-03-07 DIAGNOSIS — Z79899 Other long term (current) drug therapy: Secondary | ICD-10-CM | POA: Insufficient documentation

## 2018-03-07 DIAGNOSIS — Z7982 Long term (current) use of aspirin: Secondary | ICD-10-CM | POA: Insufficient documentation

## 2018-03-07 DIAGNOSIS — Z923 Personal history of irradiation: Secondary | ICD-10-CM | POA: Diagnosis not present

## 2018-03-07 DIAGNOSIS — E039 Hypothyroidism, unspecified: Secondary | ICD-10-CM | POA: Insufficient documentation

## 2018-03-07 DIAGNOSIS — D020 Carcinoma in situ of larynx: Secondary | ICD-10-CM | POA: Diagnosis not present

## 2018-03-07 HISTORY — DX: Neoplasm of unspecified behavior of unspecified site: D49.9

## 2018-03-07 MED ORDER — LARYNGOSCOPY SOLUTION RAD-ONC
15.0000 mL | Freq: Once | TOPICAL | Status: AC
  Administered 2018-03-07: 15 mL via TOPICAL
  Filled 2018-03-07: qty 15

## 2018-03-07 NOTE — Progress Notes (Signed)
Radiation Oncology         [phone] ________________________________  Name: Victor [name] MRN: [id number]  Date: 03/07/2018  DOB: 1936-03-25  Follow-Up Visit Note  CC: Smothers, Andree Elk, NP  Melida Quitter, MD  Diagnosis and Prior Radiotherapy:       ICD-10-CM   1. 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  Pain issues, if any: He denies pain.   Weight changes, if any:  Wt Readings from Last 3 Encounters:  03/07/18 211 lb 12.8 oz (96.1 kg)  02/15/17 208 lb 6.4 oz (94.5 kg)  07/13/16 223 lb 1.6 oz (101.2 kg)   Swallowing issues, if any: He denies.   Smoking or chewing tobacco? No  Last ENT visit was on: 08/19/2017 with Dr. Redmond Baseman   ALLERGIES:  has No Known Allergies.  Meds: Current Outpatient Medications  Medication Sig Dispense Refill  . aspirin 81 MG tablet Take 81 mg by mouth daily.    . Cholecalciferol (VITAMIN D3) 1000 units CAPS Take by mouth.    . diclofenac sodium (VOLTAREN) 1 % GEL Apply 2 g topically as needed (pain).     Marland Kitchen doxazosin (CARDURA) 2 MG tablet Take 2 mg by mouth daily.    Marland Kitchen levothyroxine (SYNTHROID, LEVOTHROID) 150 MCG tablet Take 150 mcg by mouth daily before breakfast.    . Loratadine (CLARITIN PO) Take 10 mg by mouth as needed (allergies).     Marland Kitchen omeprazole (PRILOSEC) 20 MG capsule Take 20 mg by mouth daily.    . propranolol (INDERAL) 80 MG tablet Take 80 mg by mouth daily.     . ranitidine (ZANTAC) 150 MG tablet Take 150 mg by mouth daily.    . simvastatin (ZOCOR) 40 MG tablet Take 40 mg by mouth daily.    Marland Kitchen HYDROcodone-acetaminophen (NORCO/VICODIN) 5-325 MG tablet Take by mouth.    . traMADol (ULTRAM) 50 MG tablet Take by  mouth every 6 (six) hours as needed for severe pain.      No current facility-administered medications for this encounter.     Physical Findings: Wt Readings from Last 3 Encounters:  03/07/18 211 lb 12.8 oz (96.1 kg)  02/15/17 208 lb 6.4 oz (94.5 kg)  07/13/16 223 lb 1.6 oz (101.2 kg)    height is 5\' 10"  (1.778 m) and weight is 211 lb 12.8 oz (96.1 kg). His temperature is 98.4 F (36.9 C). His blood pressure is 127/47 (abnormal) and his pulse is 62. His respiration is 18 and oxygen saturation is 95%.   General: Alert and oriented, in no acute distress. HEENT: Oral cavity and oropharynx are clear.  Neck: Neck is supple, no palpable cervical or supraclavicular lymphadenopathy.  PROCEDURE NOTE: After obtaining consent and anesthetizing the nasal cavity with topical lidocaine and phenylephrine, the flexible endoscope was introduced and passed through the nasal cavity.  Cords are symmetrically mobile with no masses. No lesions appreciated in the pharynx or larynx.   Lab Findings: Lab Results  Component Value Date   WBC 7.0 04/06/2015   HGB 13.3 04/06/2015   HCT 41.7 04/06/2015   MCV 89.7 04/06/2015   PLT 146 (L) 04/06/2015    No results found  for: TSH  Radiographic Findings: No results found.   Impression/Plan:   1) Head and Neck Cancer Status: No evidence of disease on laryngoscopy.   2) Hypothyroidism: preceded RT; continued management with PCP.   3) The patient should see Dr. Redmond Baseman in 6 months. The patient will follow up with me in 1 year. The patient knows to contact me if he has any questions or concerns.     Eppie Gibson, MD  This document serves as a record of services personally performed by Eppie Gibson, MD. It was created on her behalf by Rae Lips, a trained medical scribe. The creation of this record is based on the scribe's personal observations and the provider's statements to them. This document has been checked and approved by the attending provider.

## 2018-03-09 ENCOUNTER — Encounter: Payer: Self-pay | Admitting: Radiation Oncology

## 2018-03-10 ENCOUNTER — Ambulatory Visit: Payer: Medicare Other | Admitting: Physical Therapy

## 2018-03-17 ENCOUNTER — Ambulatory Visit: Payer: Medicare Other | Admitting: Physical Therapy

## 2018-09-08 DIAGNOSIS — Z8521 Personal history of malignant neoplasm of larynx: Secondary | ICD-10-CM | POA: Insufficient documentation

## 2018-09-08 DIAGNOSIS — R0982 Postnasal drip: Secondary | ICD-10-CM | POA: Insufficient documentation

## 2018-09-24 ENCOUNTER — Encounter: Payer: Self-pay | Admitting: Family Medicine

## 2018-09-24 ENCOUNTER — Ambulatory Visit (INDEPENDENT_AMBULATORY_CARE_PROVIDER_SITE_OTHER): Payer: Medicare Other | Admitting: Family Medicine

## 2018-09-24 VITALS — BP 100/60 | HR 68 | Temp 98.0°F | Ht 70.0 in | Wt 216.0 lb

## 2018-09-24 DIAGNOSIS — E785 Hyperlipidemia, unspecified: Secondary | ICD-10-CM | POA: Diagnosis not present

## 2018-09-24 DIAGNOSIS — E890 Postprocedural hypothyroidism: Secondary | ICD-10-CM

## 2018-09-24 DIAGNOSIS — Z7689 Persons encountering health services in other specified circumstances: Secondary | ICD-10-CM | POA: Diagnosis not present

## 2018-09-24 DIAGNOSIS — M75102 Unspecified rotator cuff tear or rupture of left shoulder, not specified as traumatic: Secondary | ICD-10-CM

## 2018-09-24 DIAGNOSIS — R0982 Postnasal drip: Secondary | ICD-10-CM

## 2018-09-24 NOTE — Progress Notes (Signed)
Victor [name] is a 82 y.o. male  Chief Complaint  Patient presents with  . Establish Care    HPI: Victor [name] is a 82 y.o. male here to establish care with our office. He has no concerns/acute issues today.  Specialists: ENT - chronic rhinitis, sinus inflammation, PND, h/o carcinoma of vocal cord; ortho - chronic Lt shoulder pain/arthritis; cardio and vascular - carotid stenosis, PAD ; nephro - CKD stage 3  Last CPE, labs: due and will schedule   Med refills needed today: none   Past Medical History:  Diagnosis Date  . Arthritis   . Atherosclerosis   . Atherosclerotic PVD with intermittent claudication ([place])   . BPH (benign prostatic hyperplasia)   . Bulging lumbar disc   . Cancer ([place]) 11/12/14   vocal cord  carcinoma in situ   . Carotid bruit   . Chronic kidney disease    chronic stage III  . DDD (degenerative disc disease), lumbar   . Dysphonia   . Dysplasia of true vocal cord   . GERD (gastroesophageal reflux disease)   . H/O carotid atherosclerosis    b/l  . Hyperlipidemia   . Occasional tremors    left hand managed with propranolol  . Peripheral vascular angioplasty status with implants and grafts   . Precancerous lesion 03/05/2018   premelanoma removed from back.   . Radiation 03/10/15- 04/18/16   Larynx  . Sleep apnea    hasn't used CPAP in years  . Spondylosis of lumbosacral region   . Thyroid disease     Past Surgical History:  Procedure Laterality Date  . APPENDECTOMY    . MOUTH SURGERY     tooth extraction  . THYROID SURGERY    . TONSILLECTOMY    . vocal cord biopsy  11/12/14   squamous cell carcinoma in situ    Social History   Socioeconomic History  . Marital status: Married    Spouse name: Not on file  . Number of children: Not on file  . Years of education: Not on file  . Highest education level: Not on file  Occupational History  . Not on file  Social Needs  . Financial resource strain: Not on file  . Food insecurity:    Worry:  Not on file    Inability: Not on file  . Transportation needs:    Medical: Not on file    Non-medical: Not on file  Tobacco Use  . Smoking status: Former Smoker    Packs/day: 1.00    Years: 50.00    Pack years: 50.00    Types: Cigarettes    Last attempt to quit: 03/05/2004    Years since quitting: 14.5  . Smokeless tobacco: Never Used  Substance and Sexual Activity  . Alcohol use: Yes    Comment: one wine-based marjarita daily  . Drug use: No  . Sexual activity: Not on file  Lifestyle  . Physical activity:    Days per week: Not on file    Minutes per session: Not on file  . Stress: Not on file  Relationships  . Social connections:    Talks on phone: Not on file    Gets together: Not on file    Attends religious service: Not on file    Active member of club or organization: Not on file    Attends meetings of clubs or organizations: Not on file    Relationship status: Not on file  . Intimate partner violence:  Fear of current or ex partner: Not on file    Emotionally abused: Not on file    Physically abused: Not on file    Forced sexual activity: Not on file  Other Topics Concern  . Not on file  Social History Narrative  . Not on file    No family history on file.   Immunization History  Administered Date(s) Administered  . Influenza, High Dose Seasonal PF 08/14/2018  . Pneumococcal Conjugate-13 09/20/2014  . Pneumococcal Polysaccharide-23 08/05/2013  . Zoster 07/06/2013  . Zoster Recombinat (Shingrix) 09/16/2017    Outpatient Encounter Medications as of 09/24/2018  Medication Sig Note  . aspirin 81 MG tablet Take 81 mg by mouth daily.   . Cholecalciferol (VITAMIN D3) 1000 units CAPS Take by mouth. 07/13/2016: Received from: [facility]: Take 1,000 Units by mouth daily.  . diclofenac sodium (VOLTAREN) 1 % GEL Apply 2 g topically as needed (pain).    Marland Kitchen doxazosin (CARDURA) 2 MG tablet Take 2 mg by mouth daily.   Marland Kitchen HYDROcodone-acetaminophen  (NORCO/VICODIN) 5-325 MG tablet Take by mouth.   . levothyroxine (SYNTHROID, LEVOTHROID) 150 MCG tablet Take 150 mcg by mouth daily before breakfast.   . Loratadine (CLARITIN PO) Take 10 mg by mouth as needed (allergies).    . NON FORMULARY HEMP OIL 500/0Z DAILY   . omeprazole (PRILOSEC) 20 MG capsule Take 20 mg by mouth daily.   . propranolol (INDERAL) 80 MG tablet Take 80 mg by mouth daily.    . ranitidine (ZANTAC) 150 MG tablet Take 150 mg by mouth daily.   . simvastatin (ZOCOR) 40 MG tablet Take 40 mg by mouth daily.   . traMADol (ULTRAM) 50 MG tablet Take by mouth every 6 (six) hours as needed for severe pain.     No facility-administered encounter medications on file as of 09/24/2018.      ROS: Gen: no fever, chills  Skin: no rash, itching ENT: no ear pain, ear drainage; + nasal congestion, rhinorrhea, sinus pressure, and PND; no sore throat Eyes: no blurry vision, double vision Resp: no cough, wheeze,SOB CV: no CP, palpitations, LE edema,  GI: no heartburn, n/v/d/c, abd pain GU: no dysuria, urgency; + frequency MSK: +back pain; + Lt shoulder pain Neuro: no dizziness, headache, weakness, vertigo Psych: no depression, anxiety, insomnia   No Known Allergies  BP 100/60 (BP Location: Right Arm, Patient Position: Sitting, Cuff Size: Normal)   Pulse 68   Temp 98 F (36.7 C) (Oral)   Ht 5\' 10"  (1.778 m)   Wt 216 lb (98 kg)   SpO2 96%   BMI 30.99 kg/m   Physical Exam  Constitutional: He is oriented to person, place, and time. He appears well-developed and well-nourished. No distress.  Cardiovascular: Normal rate and regular rhythm.  Pulmonary/Chest: Effort normal and breath sounds normal.  Neurological: He is alert and oriented to person, place, and time.  Psychiatric: He has a normal mood and affect. His behavior is normal. Thought content normal.     A/P:  1. Encounter to establish care with new doctor - due for CPE, fasting labs - pt will schedule  2.  Hyperlipidemia, unspecified hyperlipidemia type - cont current med - will check FLP at next OV  3. Postoperative hypothyroidism - stable on current dose of med - will check TSH, T4 at next OV  4. Rotator cuff syndrome of left shoulder - chronic issue - pt expressed interest in seeing sports med Dr. [name] but states  he would like to wait until the new year. Will discuss at next OV  5. Post-nasal drainage - chronic issue for which pt sees ENT - cont current meds and regular f/u with ENT

## 2018-10-13 ENCOUNTER — Encounter: Payer: Self-pay | Admitting: Family Medicine

## 2018-10-13 ENCOUNTER — Ambulatory Visit (INDEPENDENT_AMBULATORY_CARE_PROVIDER_SITE_OTHER): Payer: Medicare Other | Admitting: Family Medicine

## 2018-10-13 ENCOUNTER — Telehealth: Payer: Self-pay | Admitting: Family Medicine

## 2018-10-13 VITALS — BP 130/58 | HR 73 | Temp 98.6°F | Ht 70.0 in | Wt 216.4 lb

## 2018-10-13 DIAGNOSIS — J069 Acute upper respiratory infection, unspecified: Secondary | ICD-10-CM | POA: Diagnosis not present

## 2018-10-13 MED ORDER — AZITHROMYCIN 250 MG PO TABS: ORAL_TABLET | ORAL | 0 refills | Status: DC

## 2018-10-13 NOTE — Assessment & Plan Note (Signed)
Findings are suggestive of a viral origin.  Does not have any findings suggestive heart failure or pneumonia. -Counseled on supportive care. -Provided printed prescription azithromycin to have on hand. -Given indications to follow-up.

## 2018-10-13 NOTE — Patient Instructions (Signed)
Nice to meet you  Please try things such as zyrtec-D or allegra-D which is an antihistamine and decongestant.   Please try afrin which will help with nasal congestion but use for only three days.   Please also try using a netti pot on a regular occasion.  Honey can help with a sore throat.  Please see [place] back in 7 days if no improvement

## 2018-10-13 NOTE — Progress Notes (Signed)
DAKING WESTERVELT - 82 y.o. male MRN [id number]  Date of birth: [birth year]  SUBJECTIVE:  Including CC & ROS.  Chief Complaint  Patient presents with  . Cough    w/ sore throat for 4 days    CARLY APPLEGATE is a 82 y.o. male that is presenting with URI type symptoms.  His symptoms been present for 4 days.  He just returned from a trip to [place].  He has been taking Robitussin-DM with improvement of his symptoms.  Denies any fevers or chills.  Has some drainage.  Has a mild cough.  No history of heart failure.  Coughing is nonbloody.  Symptoms are worse at night.   Review of Systems  Constitutional: Negative for fever.  HENT: Positive for congestion and rhinorrhea.   Respiratory: Positive for cough.   Cardiovascular: Negative for chest pain.  Gastrointestinal: Negative for abdominal pain.    HISTORY: Past Medical, Surgical, Social, and Family History Reviewed & Updated per EMR.   Pertinent Historical Findings include:  Past Medical History:  Diagnosis Date  . Arthritis   . Atherosclerosis   . Atherosclerotic PVD with intermittent claudication ([place])   . BPH (benign prostatic hyperplasia)   . Bulging lumbar disc   . Cancer ([place]) 11/12/14   vocal cord  carcinoma in situ   . Carotid bruit   . Chronic kidney disease    chronic stage III  . DDD (degenerative disc disease), lumbar   . Dysphonia   . Dysplasia of true vocal cord   . GERD (gastroesophageal reflux disease)   . H/O carotid atherosclerosis    b/l  . Hyperlipidemia   . Occasional tremors    left hand managed with propranolol  . Peripheral vascular angioplasty status with implants and grafts   . Precancerous lesion 03/05/2018   premelanoma removed from back.   . Radiation 03/10/15- 04/18/16   Larynx  . Sleep apnea    hasn't used CPAP in years  . Spondylosis of lumbosacral region   . Thyroid disease     Past Surgical History:  Procedure Laterality Date  . APPENDECTOMY    . MOUTH SURGERY     tooth extraction  .  THYROID SURGERY    . TONSILLECTOMY    . vocal cord biopsy  11/12/14   squamous cell carcinoma in situ    No Known Allergies  No family history on file.   Social History   Socioeconomic History  . Marital status: Married    Spouse name: Not on file  . Number of children: Not on file  . Years of education: Not on file  . Highest education level: Not on file  Occupational History  . Not on file  Social Needs  . Financial resource strain: Not on file  . Food insecurity:    Worry: Not on file    Inability: Not on file  . Transportation needs:    Medical: Not on file    Non-medical: Not on file  Tobacco Use  . Smoking status: Former Smoker    Packs/day: 1.00    Years: 50.00    Pack years: 50.00    Types: Cigarettes    Last attempt to quit: 03/05/2004    Years since quitting: 14.6  . Smokeless tobacco: Never Used  Substance and Sexual Activity  . Alcohol use: Yes    Comment: one wine-based marjarita daily  . Drug use: No  . Sexual activity: Not on file  Lifestyle  . Physical activity:  Days per week: Not on file    Minutes per session: Not on file  . Stress: Not on file  Relationships  . Social connections:    Talks on phone: Not on file    Gets together: Not on file    Attends religious service: Not on file    Active member of club or organization: Not on file    Attends meetings of clubs or organizations: Not on file    Relationship status: Not on file  . Intimate partner violence:    Fear of current or ex partner: Not on file    Emotionally abused: Not on file    Physically abused: Not on file    Forced sexual activity: Not on file  Other Topics Concern  . Not on file  Social History Narrative  . Not on file     PHYSICAL EXAM:  VS: BP (!) 130/58 (BP Location: Left Arm, Patient Position: Sitting, Cuff Size: Normal)   Pulse 73   Temp 98.6 F (37 C) (Oral)   Ht 5\' 10"  (1.778 m)   Wt 216 lb 6.4 oz (98.2 kg)   SpO2 94%   BMI 31.05 kg/m  Physical  Exam Gen: NAD, alert, cooperative with exam,  ENT: normal lips, normal nasal mucosa, tympanic membranes clear and intact bilaterally, normal oropharynx, no cervical lymphadenopathy Eye: normal EOM, normal conjunctiva and lids CV:  no edema, +2 pedal pulses, regular rate and rhythm, S1-S2   Resp: no accessory muscle use, non-labored, clear to auscultation bilaterally, no crackles or wheezes Skin: no rashes, no areas of induration  Neuro: normal tone, normal sensation to touch Psych:  normal insight, alert and oriented MSK: Normal gait, normal strength       ASSESSMENT & PLAN:   Upper respiratory tract infection Findings are suggestive of a viral origin.  Does not have any findings suggestive heart failure or pneumonia. -Counseled on supportive care. -Provided printed prescription azithromycin to have on hand. -Given indications to follow-up.

## 2018-10-13 NOTE — Telephone Encounter (Signed)
Patient came in today saying that he did not get any labs done from his last visit on 09/24/18. Patient wants to know does he need to come in and get labs done or could he wait on them?  Please advise

## 2018-10-14 NOTE — Telephone Encounter (Signed)
Spoke with pt and explained that he is due for labs/cpe and on AVS explained he needed appt for that visit. I scheduled appt for pt for 11/12/2018 @ 8:00am

## 2018-11-07 ENCOUNTER — Other Ambulatory Visit: Payer: Self-pay

## 2018-11-07 MED ORDER — PROPRANOLOL HCL 80 MG PO TABS: 80.0000 mg | ORAL_TABLET | Freq: Every day | ORAL | 0 refills | Status: DC

## 2018-11-07 MED ORDER — LEVOTHYROXINE SODIUM 150 MCG PO TABS: 150.0000 ug | ORAL_TABLET | Freq: Every day | ORAL | 0 refills | Status: DC

## 2018-11-12 ENCOUNTER — Encounter: Payer: Self-pay | Admitting: Family Medicine

## 2018-11-12 ENCOUNTER — Ambulatory Visit (INDEPENDENT_AMBULATORY_CARE_PROVIDER_SITE_OTHER): Payer: Medicare Other | Admitting: Family Medicine

## 2018-11-12 VITALS — BP 158/70 | HR 77 | Temp 97.9°F | Ht 70.0 in | Wt 213.4 lb

## 2018-11-12 DIAGNOSIS — E559 Vitamin D deficiency, unspecified: Secondary | ICD-10-CM | POA: Diagnosis not present

## 2018-11-12 DIAGNOSIS — I739 Peripheral vascular disease, unspecified: Secondary | ICD-10-CM | POA: Diagnosis not present

## 2018-11-12 DIAGNOSIS — E782 Mixed hyperlipidemia: Secondary | ICD-10-CM

## 2018-11-12 DIAGNOSIS — I6523 Occlusion and stenosis of bilateral carotid arteries: Secondary | ICD-10-CM | POA: Diagnosis not present

## 2018-11-12 DIAGNOSIS — Z0000 Encounter for general adult medical examination without abnormal findings: Secondary | ICD-10-CM

## 2018-11-12 DIAGNOSIS — E890 Postprocedural hypothyroidism: Secondary | ICD-10-CM

## 2018-11-12 DIAGNOSIS — K219 Gastro-esophageal reflux disease without esophagitis: Secondary | ICD-10-CM

## 2018-11-12 DIAGNOSIS — R5383 Other fatigue: Secondary | ICD-10-CM

## 2018-11-12 LAB — LIPID PANEL
Cholesterol: 139 mg/dL (ref 0–200)
HDL: 37.7 mg/dL — ABNORMAL LOW (ref >39.00)
LDL Cholesterol: 71 mg/dL (ref 0–99)
NonHDL: 100.88
Total CHOL/HDL Ratio: 4
Triglycerides: 149 mg/dL (ref 0.0–149.0)
VLDL: 29.8 mg/dL (ref 0.0–40.0)

## 2018-11-12 LAB — BASIC METABOLIC PANEL
BUN: 14 mg/dL (ref 6–23)
CO2: 30 mEq/L (ref 19–32)
Calcium: 8.5 mg/dL (ref 8.4–10.5)
Chloride: 107 mEq/L (ref 96–112)
Creatinine, Ser: 1.02 mg/dL (ref 0.40–1.50)
GFR: 74.55 mL/min (ref >60.00)
Glucose, Bld: 96 mg/dL (ref 70–99)
Potassium: 4.3 mEq/L (ref 3.5–5.1)
Sodium: 143 mEq/L (ref 135–145)

## 2018-11-12 LAB — ALT: ALT: 9 U/L (ref 0–53)

## 2018-11-12 LAB — TSH: TSH: 4.43 u[IU]/mL (ref 0.35–4.50)

## 2018-11-12 LAB — AST: AST: 15 U/L (ref 0–37)

## 2018-11-12 LAB — VITAMIN B12: Vitamin B-12: 330 pg/mL (ref 211–911)

## 2018-11-12 LAB — T4, FREE: Free T4: 1.04 ng/dL (ref 0.60–1.60)

## 2018-11-12 LAB — VITAMIN D 25 HYDROXY (VIT D DEFICIENCY, FRACTURES): VITD: 32.97 ng/mL (ref 30.00–100.00)

## 2018-11-12 NOTE — Progress Notes (Signed)
Victor [name] is a 83 y.o. male      Chief Complaint  Patient presents with  . Annual Exam    pt has no complaints   HPI: Victor [name] is a 83 y.o. male here for his annual exam and routine f/u on chronic medical issues including PAD, hyperlipidemia, GERD, Vit D deficiency, h/o thyroid carcinoma and postoperative hypothyroidism.  He has no concerns today. He traveled to [place] in late 09/2018. He does endorse chronic fatigue and feeling tired x years but no worse than baseline.  He did not take any meds this AM. He is fasting for labs.  Diet/Exercise: pt states he is tried (this is not new). He travels, walks on treadmill 5 days per week. Diet is healthy.  Med refills needed today: none  He has had his flu vaccine for this year.      Past Medical History:  Diagnosis Date  . Arthritis   . Atherosclerosis   . Atherosclerotic PVD with intermittent claudication ([place])   . BPH (benign prostatic hyperplasia)   . Bulging lumbar disc   . Cancer ([place]) 11/12/14   vocal cord carcinoma in situ   . Carotid bruit   . Chronic kidney disease    chronic stage III  . DDD (degenerative disc disease), lumbar   . Dysphonia   . Dysplasia of true vocal cord   . GERD (gastroesophageal reflux disease)   . H/O carotid atherosclerosis    b/l  . Hyperlipidemia   . Occasional tremors    left hand managed with propranolol  . Peripheral vascular angioplasty status with implants and grafts   . Precancerous lesion 03/05/2018   premelanoma removed from back.   . Radiation 03/10/15- 04/18/16   Larynx  . Sleep apnea    hasn't used CPAP in years  . Spondylosis of lumbosacral region   . Thyroid disease         Past Surgical History:  Procedure Laterality Date  . APPENDECTOMY    . MOUTH SURGERY     tooth extraction  . THYROID SURGERY    . TONSILLECTOMY    . vocal cord biopsy  11/12/14   squamous cell carcinoma in situ   Social History        Socioeconomic History  . Marital status: Married   Spouse name: Not on file  . Number of children: Not on file  . Years of education: Not on file  . Highest education level: Not on file  Occupational History  . Not on file  Social Needs  . Financial resource strain: Not on file  . Food insecurity:    Worry: Not on file    Inability: Not on file  . Transportation needs:    Medical: Not on file    Non-medical: Not on file  Tobacco Use  . Smoking status: Former Smoker    Packs/day: 1.00    Years: 50.00    Pack years: 50.00    Types: Cigarettes    Last attempt to quit: 03/05/2004    Years since quitting: 14.6  . Smokeless tobacco: Never Used  Substance and Sexual Activity  . Alcohol use: Yes    Comment: one wine-based marjarita daily  . Drug use: No  . Sexual activity: Not on file  Lifestyle  . Physical activity:    Days per week: Not on file    Minutes per session: Not on file  . Stress: Not on file  Relationships  . Social connections:  Talks on phone: Not on file    Gets together: Not on file    Attends religious service: Not on file    Active member of club or organization: Not on file    Attends meetings of clubs or organizations: Not on file    Relationship status: Not on file  . Intimate partner violence:    Fear of current or ex partner: Not on file    Emotionally abused: Not on file    Physically abused: Not on file    Forced sexual activity: Not on file  Other Topics Concern  . Not on file  Social History Narrative  . Not on file   No family history on file.      Immunization History  Administered Date(s) Administered  . Influenza, High Dose Seasonal PF 08/14/2018  . Pneumococcal Conjugate-13 09/20/2014  . Pneumococcal Polysaccharide-23 08/05/2013  . Zoster 07/06/2013  . Zoster Recombinat (Shingrix) 09/16/2017        Outpatient Encounter Medications as of 11/12/2018  Medication Sig Note  . aspirin 81 MG tablet Take 81 mg by mouth daily.   . Cholecalciferol (VITAMIN D3) 1000 units CAPS Take by  mouth. 07/13/2016: Received from: [facility]: Take 1,000 Units by mouth daily.  . diclofenac sodium (VOLTAREN) 1 % GEL Apply 2 g topically as needed (pain).    Marland Kitchen doxazosin (CARDURA) 2 MG tablet Take 2 mg by mouth daily.   Marland Kitchen HYDROcodone-acetaminophen (NORCO/VICODIN) 5-325 MG tablet Take by mouth.   . levothyroxine (SYNTHROID, LEVOTHROID) 150 MCG tablet Take 1 tablet (150 mcg total) by mouth daily before breakfast.   . Loratadine (CLARITIN PO) Take 10 mg by mouth as needed (allergies).    . NON FORMULARY HEMP OIL 500/0Z DAILY   . omeprazole (PRILOSEC) 20 MG capsule Take 20 mg by mouth daily.   . propranolol ER (INDERAL LA) 80 MG 24 hr capsule TAKE ONE CAPSULE BY MOUTH ONE TIME DAILY   . ranitidine (ZANTAC) 150 MG tablet Take 150 mg by mouth daily.   . simvastatin (ZOCOR) 40 MG tablet Take 40 mg by mouth daily.   . traMADol (ULTRAM) 50 MG tablet Take by mouth every 6 (six) hours as needed for severe pain.    . [DISCONTINUED] azithromycin (ZITHROMAX) 250 MG tablet Take 500 mg the first day then 250 mg the next 4 days. Total of 5 days.   . [DISCONTINUED] propranolol (INDERAL) 80 MG tablet Take 1 tablet (80 mg total) by mouth daily. (Patient not taking: Reported on 11/12/2018)    No facility-administered encounter medications on file as of 11/12/2018.    ROS:  Gen: no fever, chills; + fatigue  Skin: no rash, itching  ENT: no ear pain, ear drainage, nasal congestion, rhinorrhea, sinus pressure, sore throat  Eyes: no blurry vision, double vision  Resp: no cough, wheeze,SOB  CV: no CP, palpitations, LE edema,  GI: + heartburn, no n/v/d/c, abd pain  GU: no dysuria, urgency, frequency, hematuria; no testicular swelling or masses  MSK: no joint pain, myalgias, back pain  Neuro: no dizziness, headache, weakness, vertigo  Psych: no depression, anxiety, insomnia  No Known Allergies  BP (!) 158/70  Pulse 77  Temp 97.9 F (36.6 C) (Oral)  Ht 5\' 10"  (1.778 m)  Wt 213 lb 6.4 oz (96.8 kg)   SpO2 94%  BMI 30.62 kg/m     BP Readings from Last 3 Encounters:  11/12/18 (!) 158/70  10/13/18 (!) 130/58  09/24/18 100/60  Physical Exam  Constitutional: He is oriented to person, place, and time. He appears well-developed and well-nourished. No distress.  HENT:  Head: Normocephalic and atraumatic.  Right Ear: Tympanic membrane and ear canal normal.  Left Ear: Tympanic membrane and ear canal normal.  Nose: Nose normal.  Mouth/Throat: Uvula is midline, oropharynx is clear and moist and mucous membranes are normal.  Neck: Neck supple. No JVD present.  Cardiovascular: Normal rate, regular rhythm and intact distal pulses.  No murmur heard.  Pulmonary/Chest: Effort normal and breath sounds normal. He has no wheezes. He has no rhonchi.  Abdominal: Soft. Bowel sounds are normal. He exhibits no distension. There is no abdominal tenderness.  Ventral hernia present  Musculoskeletal: Normal range of motion.  General: No tenderness or edema.  Lymphadenopathy:  He has no cervical adenopathy.  Neurological: He is alert and oriented to person, place, and time. He has normal reflexes. He exhibits normal muscle tone.  Skin: Skin is warm and dry.  Psychiatric: He has a normal mood and affect. His behavior is normal.   A/P:  1. Annual physical exam  - immunizations UTD - UTD on vision and dental exam - cont regular CV exercise and low fat diet  - ALT  - AST  - Basic metabolic panel  - Lipid panel  - will call pt with results (pt requests phone call after 1pm) - next CPE in 1 year  2. Bilateral carotid artery stenosis  - cont regular f/u with cardio   3. PAD (peripheral artery disease) ([facility])  - no symptoms at this time  - cont current meds, regular CV exercise   4. Gastro-esophageal reflux disease without esophagitis  - controlled with zantac  - pt will finish out the zantac tabs he has then switch to prilosec d/t recall   5. Postoperative hypothyroidism  - cont levothyroxine    - TSH  - T4, free   6. Mixed hyperlipidemia  - cont with regular CV exercise and healthy, low-fat diet  - cont zocor  - Lipid panel   7. Vitamin D deficiency  - VITAMIN D 25 Hydroxy (Vit-D Deficiency, Fractures)   8. Other fatigue  - Vitamin B12

## 2018-11-12 NOTE — Patient Instructions (Addendum)
Take the rest of the zantac that you have and then going forward we will have you take a similiar medication called pepcid (famotidine) 20mg  1 tab twice per day

## 2018-12-18 ENCOUNTER — Ambulatory Visit (INDEPENDENT_AMBULATORY_CARE_PROVIDER_SITE_OTHER): Payer: Medicare Other

## 2018-12-18 ENCOUNTER — Other Ambulatory Visit: Payer: Self-pay | Admitting: Family Medicine

## 2018-12-18 ENCOUNTER — Encounter: Payer: Self-pay | Admitting: Family Medicine

## 2018-12-18 ENCOUNTER — Ambulatory Visit (INDEPENDENT_AMBULATORY_CARE_PROVIDER_SITE_OTHER): Payer: Medicare Other | Admitting: Family Medicine

## 2018-12-18 VITALS — BP 118/58 | HR 62 | Temp 97.7°F | Ht 70.0 in | Wt 217.4 lb

## 2018-12-18 DIAGNOSIS — M25552 Pain in left hip: Secondary | ICD-10-CM

## 2018-12-18 DIAGNOSIS — M25551 Pain in right hip: Secondary | ICD-10-CM

## 2018-12-18 MED ORDER — NABUMETONE 500 MG PO TABS: 500.0000 mg | ORAL_TABLET | Freq: Two times a day (BID) | ORAL | 0 refills | Status: DC

## 2018-12-18 NOTE — Patient Instructions (Signed)
Trochanteric Bursitis  Trochanteric bursitis is a condition that causes hip pain. Trochanteric bursitis happens when fluid-filled sacs (bursae) in the hip get irritated. Normally these sacs absorb shock and help strong bands of tissue (tendons) in your hip glide smoothly over each other and over your hip bones.  What are the causes?  This condition results from increased friction between the hip bones and the tendons that go over them. This condition can happen if you:  · Have weak hips.  · Use your hip muscles too much (overuse).  · Get hit in the hip.  What increases the risk?  This condition is more likely to develop in:  · Women.  · Adults who are middle-aged or older.  · People with arthritis or a spinal condition.  · People with weak buttocks muscles (gluteal muscles).  · People who have one leg that is shorter than the other.  · People who participate in certain kinds of athletic activities, such as:  ? Running sports, especially long-distance running.  ? Contact sports, like football or martial arts.  ? Sports in which falls may occur, like skiing.  What are the signs or symptoms?  The main symptom of this condition is pain and tenderness over the point of your hip. The pain may be:  · Sharp and intense.  · Dull and achy.  · Felt on the outside of your thigh.  It may increase when you:  · Lie on your side.  · Walk or run.  · Go up on stairs.  · Sit.  · Stand up after sitting.  · Stand for long periods of time.  How is this diagnosed?  This condition may be diagnosed based on:  · Your symptoms.  · Your medical history.  · A physical exam.  · Imaging tests, such as:  ? X-rays to check your bones.  ? An MRI or ultrasound to check your tendons and muscles.  During your physical exam, your health care provider will check the movement and strength of your hip. He or she may press on the point of your hip to check for pain.  How is this treated?  This condition may be treated by:  · Resting.  · Reducing your  activity.  · Avoiding activities that cause pain.  · Using crutches, a cane, or a walker to decrease the strain on your hip.  · Taking medicine to help with swelling.  · Having medicine injected into the bursae to help with swelling.  · Using ice, heat, and massage therapy for pain relief.  · Physical therapy exercises for strength and flexibility.  · Surgery (rare).  Follow these instructions at home:  Activity  · Rest.  · Avoid activities that cause pain.  · Return to your normal activities as told by your health care provider. Ask your health care provider what activities are safe for you.  Managing pain, stiffness, and swelling  · Take over-the-counter and prescription medicines only as told by your health care provider.  · If directed, apply heat to the injured area as told by your health care provider.  ? Place a towel between your skin and the heat source.  ? Leave the heat on for 20-30 minutes.  ? Remove the heat if your skin turns bright red. This is especially important if you are unable to feel pain, heat, or cold. You may have a greater risk of getting burned.  · If directed, apply ice to the injured area:  ?   Put ice in a plastic bag.  ? Place a towel between your skin and the bag.  ? Leave the ice on for 20 minutes, 2-3 times a day.  General instructions  · If the affected leg is one that you use for driving, ask your health care provider when it is safe to drive.  · Use crutches, a cane, or a walker as told by your health care provider.  · If one of your legs is shorter than the other, get fitted for a shoe insert.  · Lose weight if you are overweight.  How is this prevented?  · Wear supportive footwear that is appropriate for your sport.  · If you have hip pain, start any new exercise or sport slowly.  · Maintain physical fitness, including:  ? Strength.  ? Flexibility.  Contact a health care provider if:  · Your pain does not improve with 2-4 weeks.  Get help right away if:  · You develop severe  pain.  · You have a fever.  · You develop increased redness over your hip.  · You have a change in your bowel function or bladder function.  · You cannot control the muscles in your feet.  This information is not intended to replace advice given to you by your health care provider. Make sure you discuss any questions you have with your health care provider.  Document Released: 11/29/2004 Document Revised: 06/27/2016 Document Reviewed: 10/07/2015  Elsevier Interactive Patient Education © 2019 [employer]      Trochanteric Bursitis Rehab  Ask your health care provider which exercises are safe for you. Do exercises exactly as told by your health care provider and adjust them as directed. It is normal to feel mild stretching, pulling, tightness, or discomfort as you do these exercises, but you should stop right away if you feel sudden pain or your pain gets worse. Do not begin these exercises until told by your health care provider.  Stretching exercises  These exercises warm up your muscles and joints and improve the movement and flexibility of your hip. These exercises also help to relieve pain and stiffness.  Exercise A: Iliotibial band stretch    1. Lie on your side with your left / right leg in the top position.  2. Bend your left / right knee and grab your ankle.  3. Slowly bring your knee back so your thigh is behind your body.  4. Slowly lower your knee toward the floor until you feel a gentle stretch on the outside of your left / right thigh. If you do not feel a stretch and your knee will not fall farther, place the heel of your other foot on top of your outer knee and pull your thigh down farther.  5. Hold this position for __________ seconds.  6. Slowly return to the starting position.  Repeat __________ times. Complete this exercise __________ times a day.  Strengthening exercises  These exercises build strength and endurance in your hip and pelvis. Endurance is the ability to use your muscles for a long  time, even after they get tired.  Exercise B: Bridge (hip extensors)    1. Lie on your back on a firm surface with your knees bent and your feet flat on the floor.  2. Tighten your buttocks muscles and lift your buttocks off the floor until your trunk is level with your thighs. You should feel the muscles working in your buttocks and the back of your thighs. If this   exercise is too easy, try doing it with your arms crossed over your chest.  3. Hold this position for __________ seconds.  4. Slowly return to the starting position.  5. Let your muscles relax completely between repetitions.  Repeat __________ times. Complete this exercise __________ times a day.  Exercise C: Squats (knee extensors and  quadriceps)  1. Stand in front of a table, with your feet and knees pointing straight ahead. You may rest your hands on the table for balance but not for support.  2. Slowly bend your knees and lower your hips like you are going to sit in a chair.  ? Keep your weight over your heels, not over your toes.  ? Keep your lower legs upright so they are parallel with the table legs.  ? Do not let your hips go lower than your knees.  ? Do not bend lower than told by your health care provider.  ? If your hip pain increases, do not bend as low.  3. Hold this position for __________ seconds.  4. Slowly push with your legs to return to standing. Do not use your hands to pull yourself to standing.  Repeat __________ times. Complete this exercise __________ times a day.  Exercise D: Hip hike  1. Stand sideways on a bottom step. Stand on your left / right leg with your other foot unsupported next to the step. You can hold onto the railing or wall if needed for balance.  2. Keeping your knees straight and your torso square, lift your left / right hip up toward the ceiling.  3. Hold this position for __________ seconds.  4. Slowly let your left / right hip lower toward the floor, past the starting position. Your foot should get closer to  the floor. Do not lean or bend your knees.  Repeat __________ times. Complete this exercise __________ times a day.  Exercise E: Single leg stand  1. Stand near a counter or door frame that you can hold onto for balance as needed. It is helpful to stand in front of a mirror for this exercise so you can watch your hip.  2. Squeeze your left / right buttock muscles then lift up your other foot. Do not let your left / right hip push out to the side.  3. Hold this position for __________ seconds.  Repeat __________ times. Complete this exercise __________ times a day.  This information is not intended to replace advice given to you by your health care provider. Make sure you discuss any questions you have with your health care provider.  Document Released: 11/29/2004 Document Revised: 06/28/2016 Document Reviewed: 10/07/2015  Elsevier Interactive Patient Education © 2019 [employer] Inc[employer]

## 2018-12-18 NOTE — Progress Notes (Signed)
Victor [name] is a 83 y.o. male  Chief Complaint  Patient presents with  . 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 male complains of Rt hip pain x 7 days and Lt hip pain x 20 years. He lays predominantly on his Rt side d/t Lt sided hip pain x year.  Pt notes Rt hip pain, difficulty getting out of bed, difficulty walking that began a week ago. He has had 3 episodes in a week.  No pain at rest or when standing up/still. Pain when he lays on his Rt. No numbness or tingling in LE. No weakness. No falls.  He had an injection in his Lt trochanteric bursa about 1-1.5 years ago and he states he is not sure it really did anything.  He states he was told years ago he may have a "bursa problem" Pt takes hemp oil but no NSAID or tylenol.   Past Medical History:  Diagnosis Date  . Arthritis   . Atherosclerosis   . Atherosclerotic PVD with intermittent claudication ([place])   . BPH (benign prostatic hyperplasia)   . Bulging lumbar disc   . Cancer ([place]) 11/12/14   vocal cord  carcinoma in situ   . Carotid bruit   . Chronic kidney disease    chronic stage III  . DDD (degenerative disc disease), lumbar   . Dysphonia   . Dysplasia of true vocal cord   . GERD (gastroesophageal reflux disease)   . H/O carotid atherosclerosis    b/l  . Hyperlipidemia   . Occasional tremors    left hand managed with propranolol  . Peripheral vascular angioplasty status with implants and grafts   . Precancerous lesion 03/05/2018   premelanoma removed from back.   . Radiation 03/10/15- 04/18/16   Larynx  . Sleep apnea    hasn't used CPAP in years  . Spondylosis of lumbosacral region   . Thyroid disease     Past Surgical History:  Procedure Laterality Date  . APPENDECTOMY    . MOUTH SURGERY     tooth extraction  . THYROID SURGERY    . TONSILLECTOMY    . vocal cord biopsy  11/12/14   squamous cell  carcinoma in situ    Social History   Socioeconomic History  . Marital status: Married    Spouse name: Not on file  . Number of children: Not on file  . Years of education: Not on file  . Highest education level: Not on file  Occupational History  . Not on file  Social Needs  . Financial resource strain: Not on file  . Food insecurity:    Worry: Not on file    Inability: Not on file  . Transportation needs:    Medical: Not on file    Non-medical: Not on file  Tobacco Use  . Smoking status: Former Smoker    Packs/day: 1.00    Years: 50.00    Pack years: 50.00    Types: Cigarettes    Last attempt to quit: 03/05/2004    Years since quitting: 14.7  . Smokeless tobacco: Never Used  Substance and Sexual Activity  . Alcohol use: Yes    Comment: one wine-based marjarita daily  . Drug use: No  . Sexual activity: Not on file  Lifestyle  . Physical activity:  Days per week: Not on file    Minutes per session: Not on file  . Stress: Not on file  Relationships  . Social connections:    Talks on phone: Not on file    Gets together: Not on file    Attends religious service: Not on file    Active member of club or organization: Not on file    Attends meetings of clubs or organizations: Not on file    Relationship status: Not on file  . Intimate partner violence:    Fear of current or ex partner: Not on file    Emotionally abused: Not on file    Physically abused: Not on file    Forced sexual activity: Not on file  Other Topics Concern  . Not on file  Social History Narrative  . Not on file    No family history on file.   Immunization History  Administered Date(s) Administered  . Influenza, High Dose Seasonal PF 08/14/2018  . Pneumococcal Conjugate-13 09/20/2014  . Pneumococcal Polysaccharide-23 08/05/2013  . Zoster 07/06/2013  . Zoster Recombinat (Shingrix) 09/16/2017    Outpatient Encounter Medications as of 12/18/2018  Medication Sig Note  . aspirin 81 MG  tablet Take 81 mg by mouth daily.   . Cholecalciferol (VITAMIN D3) 1000 units CAPS Take by mouth. 07/13/2016: Received from: [facility]: Take 1,000 Units by mouth daily.  . diclofenac sodium (VOLTAREN) 1 % GEL Apply 2 g topically as needed (pain).    Marland Kitchen doxazosin (CARDURA) 2 MG tablet Take 2 mg by mouth daily.   Marland Kitchen HYDROcodone-acetaminophen (NORCO/VICODIN) 5-325 MG tablet Take by mouth.   . levothyroxine (SYNTHROID, LEVOTHROID) 150 MCG tablet Take 1 tablet (150 mcg total) by mouth daily before breakfast.   . Loratadine (CLARITIN PO) Take 10 mg by mouth as needed (allergies).    . NON FORMULARY HEMP OIL 500/0Z DAILY   . omeprazole (PRILOSEC) 20 MG capsule Take 20 mg by mouth daily.   . propranolol ER (INDERAL LA) 80 MG 24 hr capsule TAKE ONE CAPSULE BY MOUTH ONE TIME DAILY   . ranitidine (ZANTAC) 150 MG tablet Take 150 mg by mouth daily.   . simvastatin (ZOCOR) 40 MG tablet Take 40 mg by mouth daily.   . traMADol (ULTRAM) 50 MG tablet Take by mouth every 6 (six) hours as needed for severe pain.     No facility-administered encounter medications on file as of 12/18/2018.      ROS: Pertinent positives and negatives noted in HPI. Remainder of ROS non-contributory  No Known Allergies  BP (!) 118/58   Pulse 62   Temp 97.7 F (36.5 C) (Oral)   Ht 5\' 10"  (1.778 m)   Wt 217 lb 6.4 oz (98.6 kg)   SpO2 96%   BMI 31.19 kg/m   Physical Exam  Constitutional: He is oriented to person, place, and time. He appears well-developed and well-nourished. No distress.  Musculoskeletal:     Comments: Hips: good/full ROM, Rt hip with TTP over greater trochanter, Lt hip with pain upon external rotation and TTP over lateral hip joint  Neurological: He is alert and oriented to person, place, and time. He has normal reflexes. Coordination normal.  Psychiatric: He has a normal mood and affect. His behavior is normal.     A/P:  1. Bilateral hip pain - will obtain B/L hip xrays today Rx: -  nabumetone (RELAFEN) 500 MG tablet; Take 1 tablet (500 mg total) by mouth 2 (two)  times daily.  Dispense: 60 tablet; Refill: 0 - heat/ice - reviewed with pt and included in AVS some exercises for him to do daily - f/u if symptoms worsen or do not improve in about 2 wks Discussed plan and reviewed medications with patient, including risks, benefits, and potential side effects. Pt expressed understand. All questions answered.

## 2018-12-24 NOTE — Progress Notes (Signed)
Yes that is fine to add PSA and have pt come for lab appt. Please pass along to pt that I had not done it previously bc PSA is no longer recommended as a screening for prostate cancer (too many elevated results which prompt prostate biopsy with negative result, risk > benefit) and is only recommended in pts with active or history of prostate cancer. I am fine for him to do it tho if that is his preference

## 2019-01-09 ENCOUNTER — Other Ambulatory Visit: Payer: Self-pay

## 2019-01-09 DIAGNOSIS — E785 Hyperlipidemia, unspecified: Secondary | ICD-10-CM

## 2019-01-09 MED ORDER — SIMVASTATIN 40 MG PO TABS: 40.0000 mg | ORAL_TABLET | Freq: Every day | ORAL | 3 refills | Status: DC

## 2019-02-05 ENCOUNTER — Other Ambulatory Visit: Payer: Self-pay

## 2019-02-05 ENCOUNTER — Telehealth: Payer: Self-pay | Admitting: Family Medicine

## 2019-02-05 MED ORDER — PROPRANOLOL HCL ER 80 MG PO CP24: 80.0000 mg | ORAL_CAPSULE | Freq: Every day | ORAL | 3 refills | Status: DC

## 2019-02-05 NOTE — Telephone Encounter (Signed)
New rx sent to pharmacy

## 2019-02-05 NOTE — Telephone Encounter (Signed)
I believe patient follows with cardio (outside [facility]) who likely Rx the med and should be the one to refill. If pt is not still following with cardio, please clarify med, dose, and instructions and I will send to pharm

## 2019-02-05 NOTE — Telephone Encounter (Signed)
Dr. Loletha Grayer please advise you've never prescribed

## 2019-02-05 NOTE — Telephone Encounter (Signed)
Copied from [place] [phone]. Topic: Quick Communication - Rx Refill/Question >> Feb 05, 2019  9:17 AM Celene Kras A wrote: Medication: [place] from pharmacy called for clarification on dosage of pts propranolol ER (INDERAL LA) 80 MG 24 hr capsule. Please contact and advise.  Has the patient contacted their pharmacy? Yes.   (Agent: If no, request that the patient contact the pharmacy for the refill.) (Agent: If yes, when and what did the pharmacy advise?)  Preferred Pharmacy (with phone number or street name): [facility] [address] [place] [address] [place] [address] [address] Phone: [phone] Fax: [phone] Not a 24 hour pharmacy; exact hours not known.    Agent: Please be advised that RX refills may take up to 3 business days. We ask that you follow-up with your pharmacy.

## 2019-03-11 ENCOUNTER — Telehealth: Payer: Self-pay

## 2019-03-11 ENCOUNTER — Telehealth: Payer: Self-pay | Admitting: *Deleted

## 2019-03-11 NOTE — Telephone Encounter (Signed)
CALLED PATIENT TO INFORM THAT DR. SQUIRE WANTS TO PUSH HIS FU OUT 7 MONTHS, PATIENT HAS BEEN RESCHEDULED FOR 10-16-19 @ 2 PM, PATIENT AGREED TO NEW TIME AND DATE

## 2019-03-11 NOTE — Telephone Encounter (Signed)
I called the patient today about their upcoming follow-up appointment in radiation oncology.   Given concerns about the COVID-19 pandemic, I offered a phone assessment with the patient to determine if coming to the clinic was necessary. The patient accepted.  I let the patient know that I had spoken with Dr. Isidore Moos, and she wanted them to know the importance of washing their hands for at least 20 seconds at a time, especially after going out in public, and before they eat. Limit going out in public whenever possible. Do not touch your face, unless your hands are clean, such as when bathing. Get plenty of rest, eat well, and stay hydrated.   Symptomatically, the patient is doing relatively well. He reports feeling well with no concerns at this time.   All questions were answered to the patient's satisfaction.  I encouraged the patient to call with any further questions. Otherwise, the plan is to see Dr. Redmond Baseman within the next month for check up and follow up with Dr. Isidore Moos in 7 months.    Patient is pleased with this plan, and we will cancel their upcoming follow-up to reduce the risk of COVID-19 transmission.

## 2019-03-13 ENCOUNTER — Ambulatory Visit: Payer: Medicare Other | Admitting: Radiation Oncology

## 2019-03-16 ENCOUNTER — Encounter: Payer: Self-pay | Admitting: Family Medicine

## 2019-03-16 ENCOUNTER — Ambulatory Visit (INDEPENDENT_AMBULATORY_CARE_PROVIDER_SITE_OTHER): Payer: Medicare Other | Admitting: Family Medicine

## 2019-03-16 VITALS — BP 136/60 | HR 63 | Temp 98.0°F | Ht 70.0 in

## 2019-03-16 DIAGNOSIS — M5412 Radiculopathy, cervical region: Secondary | ICD-10-CM

## 2019-03-16 MED ORDER — PREDNISONE 5 MG PO TABS: ORAL_TABLET | ORAL | 0 refills | Status: DC

## 2019-03-16 NOTE — Patient Instructions (Signed)
Good to see you  Please adjust your posture while you are sitting and watching TV  Please try the exercises and medicine  You can send me a message through MyChart with any questions or updates.  Please see me back in 1 month if no better.

## 2019-03-16 NOTE — Assessment & Plan Note (Signed)
Likely radiculopathy related to the way he has been sitting in his chair.  - prednisone  - counseled on HEP and supportive care - if no improvement consider gabapentin and imaging.

## 2019-03-16 NOTE — Progress Notes (Signed)
Victor [name] - 83 y.o. male MRN [id number]  Date of birth: [birth year]  SUBJECTIVE:  Including CC & ROS.  Chief Complaint  Patient presents with  . Pain    left shoulder pain/ tingling all way down to fingers/ on and off 3-4 week    Victor [name] is a 83 y.o. male that is presenting with left arm pain and altered sensation. Symptoms have been occurring intermittently over the past few weeks. He feels this as of this morning. Has occurred more than once. Having his symptoms on the anterior aspect of his arm and hand. Felt pain on the side of his neck. Has been leaning on his left side while he watches TV. Denies any new or different exercises.  Review of the MRI left shoulder shows subscapularis tendinopathy with bursal and articular surface tears and mild to moderate infraspinatus and supraspinatus tendinopathy humeral joint degenerative changes.   Review of Systems  Constitutional: Negative for fever.  HENT: Negative for congestion.   Respiratory: Negative for cough.   Cardiovascular: Negative for chest pain.  Gastrointestinal: Negative for abdominal pain.  Musculoskeletal: Negative for joint swelling.  Skin: Negative for color change.  Neurological: Positive for numbness.  Psychiatric/Behavioral: Negative for agitation.    HISTORY: Past Medical, Surgical, Social, and Family History Reviewed & Updated per EMR.   Pertinent Historical Findings include:  Past Medical History:  Diagnosis Date  . Arthritis   . Atherosclerosis   . Atherosclerotic PVD with intermittent claudication ([place])   . BPH (benign prostatic hyperplasia)   . Bulging lumbar disc   . Cancer ([place]) 11/12/14   vocal cord  carcinoma in situ   . Carotid bruit   . Chronic kidney disease    chronic stage III  . DDD (degenerative disc disease), lumbar   . Dysphonia   . Dysplasia of true vocal cord   . GERD (gastroesophageal reflux disease)   . H/O carotid atherosclerosis    b/l  . Hyperlipidemia   . Occasional  tremors    left hand managed with propranolol  . Peripheral vascular angioplasty status with implants and grafts   . Precancerous lesion 03/05/2018   premelanoma removed from back.   . Radiation 03/10/15- 04/18/16   Larynx  . Sleep apnea    hasn't used CPAP in years  . Spondylosis of lumbosacral region   . Thyroid disease     Past Surgical History:  Procedure Laterality Date  . APPENDECTOMY    . MOUTH SURGERY     tooth extraction  . THYROID SURGERY    . TONSILLECTOMY    . vocal cord biopsy  11/12/14   squamous cell carcinoma in situ    No Known Allergies  No family history on file.   Social History   Socioeconomic History  . Marital status: Married    Spouse name: Not on file  . Number of children: Not on file  . Years of education: Not on file  . Highest education level: Not on file  Occupational History  . Not on file  Social Needs  . Financial resource strain: Not on file  . Food insecurity:    Worry: Not on file    Inability: Not on file  . Transportation needs:    Medical: Not on file    Non-medical: Not on file  Tobacco Use  . Smoking status: Former Smoker    Packs/day: 1.00    Years: 50.00    Pack years: 50.00    Types:  Cigarettes    Last attempt to quit: 03/05/2004    Years since quitting: 15.0  . Smokeless tobacco: Never Used  Substance and Sexual Activity  . Alcohol use: Yes    Comment: one wine-based marjarita daily  . Drug use: No  . Sexual activity: Not on file  Lifestyle  . Physical activity:    Days per week: Not on file    Minutes per session: Not on file  . Stress: Not on file  Relationships  . Social connections:    Talks on phone: Not on file    Gets together: Not on file    Attends religious service: Not on file    Active member of club or organization: Not on file    Attends meetings of clubs or organizations: Not on file    Relationship status: Not on file  . Intimate partner violence:    Fear of current or ex partner: Not on  file    Emotionally abused: Not on file    Physically abused: Not on file    Forced sexual activity: Not on file  Other Topics Concern  . Not on file  Social History Narrative  . Not on file     PHYSICAL EXAM:  VS: BP 136/60   Pulse 63   Temp 98 F (36.7 C) (Oral)   Ht 5\' 10"  (1.778 m)   SpO2 95%   BMI 31.19 kg/m  Physical Exam Gen: NAD, alert, cooperative with exam, well-appearing ENT: normal lips, normal nasal mucosa,  Eye: normal EOM, normal conjunctiva and lids CV:  no edema, +2 pedal pulses   Resp: no accessory muscle use, non-labored,  Skin: no rashes, no areas of induration  Neuro: normal tone, normal sensation to touch Psych:  normal insight, alert and oriented MSK:  Neck/Left shoulder:  No signs of atrophy  Normal neck ROM  Normal SHOulder and wrist ROM  Normal grip strength  Normal strength to resistance with finger adduction and abduction, thumb hyperextension.  Normal pincer grasp.  Neurovascularly intact       ASSESSMENT & PLAN:   Cervical radiculopathy Likely radiculopathy related to the way he has been sitting in his chair.  - prednisone  - counseled on HEP and supportive care - if no improvement consider gabapentin and imaging.

## 2019-04-07 DIAGNOSIS — R1313 Dysphagia, pharyngeal phase: Secondary | ICD-10-CM | POA: Insufficient documentation

## 2019-05-05 ENCOUNTER — Other Ambulatory Visit: Payer: Self-pay | Admitting: Family Medicine

## 2019-05-12 ENCOUNTER — Other Ambulatory Visit: Payer: Self-pay | Admitting: Family Medicine

## 2019-06-04 ENCOUNTER — Encounter: Payer: Self-pay | Admitting: Family Medicine

## 2019-06-04 ENCOUNTER — Other Ambulatory Visit: Payer: Self-pay

## 2019-06-04 ENCOUNTER — Telehealth (INDEPENDENT_AMBULATORY_CARE_PROVIDER_SITE_OTHER): Payer: Medicare Other | Admitting: Family Medicine

## 2019-06-04 ENCOUNTER — Telehealth: Payer: Self-pay | Admitting: Family Medicine

## 2019-06-04 DIAGNOSIS — M47816 Spondylosis without myelopathy or radiculopathy, lumbar region: Secondary | ICD-10-CM | POA: Diagnosis not present

## 2019-06-04 DIAGNOSIS — M5416 Radiculopathy, lumbar region: Secondary | ICD-10-CM | POA: Diagnosis not present

## 2019-06-04 DIAGNOSIS — R0600 Dyspnea, unspecified: Secondary | ICD-10-CM

## 2019-06-04 DIAGNOSIS — R0609 Other forms of dyspnea: Secondary | ICD-10-CM | POA: Diagnosis not present

## 2019-06-04 DIAGNOSIS — M5136 Other intervertebral disc degeneration, lumbar region: Secondary | ICD-10-CM

## 2019-06-04 MED ORDER — TRAMADOL HCL 50 MG PO TABS: 50.0000 mg | ORAL_TABLET | Freq: Two times a day (BID) | ORAL | 0 refills | Status: DC | PRN

## 2019-06-04 MED ORDER — BUDESONIDE 90 MCG/ACT IN AEPB: 2.0000 | INHALATION_SPRAY | Freq: Two times a day (BID) | RESPIRATORY_TRACT | 5 refills | Status: DC

## 2019-06-04 MED ORDER — HYDROCODONE-ACETAMINOPHEN 5-325 MG PO TABS: 1.0000 | ORAL_TABLET | Freq: Four times a day (QID) | ORAL | 0 refills | Status: DC | PRN

## 2019-06-04 NOTE — Telephone Encounter (Addendum)
Patient stated the practice set up for him to go to [place] but the patient would like to go to his heartcare facility where his heart doctor is:  Dr. [name] w/[facility] [address], [place]

## 2019-06-04 NOTE — Progress Notes (Signed)
Virtual Visit via Video Note  I connected with Victor [name] on 06/09/19 at  2:30 PM EDT by a video enabled telemedicine application and verified that I am speaking with the correct person using two identifiers. Location patient: home Location provider: work  Persons participating in the virtual visit: patient, provider  I discussed the limitations of evaluation and management by telemedicine and the availability of in person appointments. The patient expressed understanding and agreed to proceed.  Chief Complaint  Patient presents with  . Generalized Body Aches    body aches(thinks may be arthritis)/ SOB- when doing things like making bed/ fatigued ( not sleeping good) runny nose/ couple years   HPI: Victor [name] is a 83 y.o. male complains of fatigue, SOB with exertion over the past 3-4 months. Symptoms now present when pts makes his bed. No CP, orthopnea, edema. Pt follows with cardio but has not ben seen for this issue. Pt also has 50pk yr h/o tobacco use, quit 10-15 years ago.   Pt had lumbar DDD and herniated lumbar disc as well as spondylosis of lumbosacral region x years. He power washed his driveway and sidewalk over the course of 2 days about 2 weeks ago and has had increased LBP since that time. In the past, he's had PRN Rxs for ultram and also norco. He requests refill of these to take PRN.   Past Medical History:  Diagnosis Date  . Arthritis   . Atherosclerosis   . Atherosclerotic PVD with intermittent claudication ([place])   . BPH (benign prostatic hyperplasia)   . Bulging lumbar disc   . Cancer ([place]) 11/12/14   vocal cord  carcinoma in situ   . Carotid bruit   . Chronic kidney disease    chronic stage III  . DDD (degenerative disc disease), lumbar   . Dysphonia   . Dysplasia of true vocal cord   . GERD (gastroesophageal reflux disease)   . H/O carotid atherosclerosis    b/l  . Hyperlipidemia   . Occasional tremors    left hand managed with propranolol  . Peripheral  vascular angioplasty status with implants and grafts   . Precancerous lesion 03/05/2018   premelanoma removed from back.   . Radiation 03/10/15- 04/18/16   Larynx  . Sleep apnea    hasn't used CPAP in years  . Spondylosis of lumbosacral region   . Thyroid disease     Past Surgical History:  Procedure Laterality Date  . APPENDECTOMY    . MOUTH SURGERY     tooth extraction  . THYROID SURGERY    . TONSILLECTOMY    . vocal cord biopsy  11/12/14   squamous cell carcinoma in situ    No family history on file.  Social History   Tobacco Use  . Smoking status: Former Smoker    Packs/day: 1.00    Years: 50.00    Pack years: 50.00    Types: Cigarettes    Quit date: 03/05/2004    Years since quitting: 15.2  . Smokeless tobacco: Never Used  Substance Use Topics  . Alcohol use: Yes    Comment: one wine-based marjarita daily  . Drug use: No     Current Outpatient Medications:  .  aspirin 81 MG tablet, Take 81 mg by mouth daily., Disp: , Rfl:  .  Cholecalciferol (VITAMIN D3) 1000 units CAPS, Take by mouth., Disp: , Rfl:  .  diclofenac sodium (VOLTAREN) 1 % GEL, Apply 2 g topically as needed (pain). ,  Disp: , Rfl:  .  doxazosin (CARDURA) 2 MG tablet, Take 2 mg by mouth daily., Disp: , Rfl:  .  levothyroxine (SYNTHROID) 150 MCG tablet, TAKE ONE TABLET BY MOUTH ONE TIME DAILY BEFORE BREAKFAST, Disp: 90 tablet, Rfl: 0 .  NON FORMULARY, HEMP OIL 500/0Z DAILY, Disp: , Rfl:  .  omeprazole (PRILOSEC) 20 MG capsule, Take 20 mg by mouth daily., Disp: , Rfl:  .  propranolol ER (INDERAL LA) 80 MG 24 hr capsule, Take 1 capsule (80 mg total) by mouth daily., Disp: 90 capsule, Rfl: 3 .  simvastatin (ZOCOR) 40 MG tablet, Take 1 tablet (40 mg total) by mouth daily., Disp: 90 tablet, Rfl: 3 .  Budesonide 90 MCG/ACT inhaler, Inhale 2 puffs into the lungs 2 (two) times daily., Disp: 1 each, Rfl: 5 .  HYDROcodone-acetaminophen (NORCO/VICODIN) 5-325 MG tablet, Take 1 tablet by mouth every 6 (six) hours as  needed for moderate pain., Disp: 30 tablet, Rfl: 0 .  Loratadine (CLARITIN PO), Take 10 mg by mouth as needed (allergies). , Disp: , Rfl:  .  nabumetone (RELAFEN) 500 MG tablet, Take 1 tablet (500 mg total) by mouth 2 (two) times daily. (Patient not taking: Reported on 03/16/2019), Disp: 60 tablet, Rfl: 0 .  traMADol (ULTRAM) 50 MG tablet, Take 1 tablet (50 mg total) by mouth every 12 (twelve) hours as needed for severe pain., Disp: 60 tablet, Rfl: 0  No Known Allergies    ROS: See pertinent positives and negatives per HPI.   EXAM:  VITALS per patient if applicable: There were no vitals taken for this visit.   GENERAL: alert, oriented, appears well and in no acute distress  HEENT: atraumatic, conjunctiva clear, no obvious abnormalities on inspection of external nose and ears  NECK: normal movements of the head and neck  LUNGS: on inspection no signs of respiratory distress, breathing rate appears normal, no obvious gross SOB, gasping or wheezing, no conversational dyspnea  CV: no obvious cyanosis  MS: moves all visible extremities without noticeable abnormality  PSYCH/NEURO: pleasant and cooperative, no obvious depression or anxiety, speech and thought processing grossly intact   ASSESSMENT AND PLAN:  1. Lumbar radiculopathy 2. Facet arthritis of lumbar region 3. Other intervertebral disc degeneration, lumbar region - controlled substance database reviewed and appropriate - cont supportive care Refill: - HYDROcodone-acetaminophen (NORCO/VICODIN) 5-325 MG tablet; Take 1 tablet by mouth every 6 (six) hours as needed for moderate pain.  Dispense: 30 tablet; Refill: 0 - traMADol (ULTRAM) 50 MG tablet; Take 1 tablet (50 mg total) by mouth every 12 (twelve) hours as needed for severe pain.  Dispense: 60 tablet; Refill: 0 - f/u in 2-4 wks if no or minimal improvement, sooner if symptoms worsen  4. DOE (dyspnea on exertion) Rx: - Budesonide 90 MCG/ACT inhaler; Inhale 2 puffs into  the lungs 2 (two) times daily.  Dispense: 1 each; Refill: 5 - ECHOCARDIOGRAM COMPLETE; Future - f/u in 2 wks via VV. If symptoms improved with inhaler, will cont with it. If no or minimal improvement and echo is WNL, will get PFTs  I discussed the assessment and treatment plan with the patient. The patient was provided an opportunity to ask questions and all were answered. The patient agreed with the plan and demonstrated an understanding of the instructions.   The patient was advised to call back or seek an in-person evaluation if the symptoms worsen or if the condition fails to improve as anticipated.   Letta Median, DO

## 2019-06-05 NOTE — Telephone Encounter (Signed)
Dr.C please advise

## 2019-06-05 NOTE — Telephone Encounter (Signed)
Ok to change referral to pts cardio as documented below. Thank you!

## 2019-06-05 NOTE — Telephone Encounter (Signed)
Entered referral as pt requested. Replied to pt through mychart telling him we changed this.

## 2019-06-09 ENCOUNTER — Encounter: Payer: Self-pay | Admitting: Family Medicine

## 2019-06-15 ENCOUNTER — Telehealth: Payer: Self-pay | Admitting: Family Medicine

## 2019-06-15 NOTE — Telephone Encounter (Signed)
Admin please help, can you guys change it on your end or we have to place another referral?    Copied from [place] [phone]. Topic: Referral - Question >> Jun 15, 2019 11:16 AM Parke Poisson wrote: Reason for CRM: Rise Paganini from Dr Mila Palmer office called stating that pt said he is supposed to be getting an echo but referral only says it's for a consult. She states it never landed in her [place] because it was sent as outgoing.Needs to be changed to internal for her to get it

## 2019-06-18 ENCOUNTER — Encounter: Payer: Self-pay | Admitting: Family Medicine

## 2019-06-18 ENCOUNTER — Ambulatory Visit (INDEPENDENT_AMBULATORY_CARE_PROVIDER_SITE_OTHER): Payer: Medicare Other | Admitting: Family Medicine

## 2019-06-18 DIAGNOSIS — R0609 Other forms of dyspnea: Secondary | ICD-10-CM

## 2019-06-18 DIAGNOSIS — Z87891 Personal history of nicotine dependence: Secondary | ICD-10-CM

## 2019-06-18 DIAGNOSIS — R0600 Dyspnea, unspecified: Secondary | ICD-10-CM

## 2019-06-18 NOTE — Progress Notes (Signed)
Virtual Visit via Telephone Note  I connected with [name] on 06/18/19 at  1:00 PM EDT by telephone and verified that I am speaking with the correct person using two identifiers.   I discussed the limitations, risks, security and privacy concerns of performing an evaluation and management service by telephone and the availability of in person appointments. I also discussed with the patient that there may be a patient responsible charge related to this service. The patient expressed understanding and agreed to proceed.  Location patient: home Location provider: work or home office Participants present for the call: patient, provider Patient did not have a visit in the prior 7 days to address this/these issue(s).  Chief Complaint  Patient presents with  . Follow-up     History of Present Illness: Victor [name] is a 83 y.o. male to f/u on virtual appt from 2 weeks ago where he complains of increased DOE x months. Referral was placed for echo and trail of budesonide inhaler recommended. There has been some difficulty/confusion re: scheduling of echo but cardio office will call pt this afternoon. Pt has been using inhaler but does not feel it has made a noticeable improvement in his symptoms.  He denies edema, weight changes, orthopnea, SOB at rest, wheeze. He endorses increased PND, throat-clearing cough, runny nose x 1 mo. He takes generic zyrtec 1 tab daily.  He does have a 50pk/yr h/o tobacco use. He quit in 2005.   Past Medical History:  Diagnosis Date  . Arthritis   . Atherosclerosis   . Atherosclerotic PVD with intermittent claudication ([place])   . BPH (benign prostatic hyperplasia)   . Bulging lumbar disc   . Cancer ([place]) 11/12/14   vocal cord  carcinoma in situ   . Carotid bruit   . Chronic kidney disease    chronic stage III  . DDD (degenerative disc disease), lumbar   . Dysphonia   . Dysplasia of true vocal cord   . GERD (gastroesophageal reflux disease)   . H/O carotid  atherosclerosis    b/l  . Hyperlipidemia   . Occasional tremors    left hand managed with propranolol  . Peripheral vascular angioplasty status with implants and grafts   . Precancerous lesion 03/05/2018   premelanoma removed from back.   . Radiation 03/10/15- 04/18/16   Larynx  . Sleep apnea    hasn't used CPAP in years  . Spondylosis of lumbosacral region   . Thyroid disease     Past Surgical History:  Procedure Laterality Date  . APPENDECTOMY    . MOUTH SURGERY     tooth extraction  . THYROID SURGERY    . TONSILLECTOMY    . vocal cord biopsy  11/12/14   squamous cell carcinoma in situ    Social History   Tobacco Use  . Smoking status: Former Smoker    Packs/day: 1.00    Years: 50.00    Pack years: 50.00    Types: Cigarettes    Quit date: 03/05/2004    Years since quitting: 15.2  . Smokeless tobacco: Never Used  Substance Use Topics  . Alcohol use: Yes    Comment: one wine-based marjarita daily  . Drug use: No    History reviewed. No pertinent family history.  Outpatient Encounter Medications as of 06/18/2019  Medication Sig Note  . aspirin 81 MG tablet Take 81 mg by mouth daily.   . Budesonide 90 MCG/ACT inhaler Inhale 2 puffs into the lungs 2 (two)  times daily.   . Cholecalciferol (VITAMIN D3) 1000 units CAPS Take by mouth. 07/13/2016: Received from: [facility]: Take 1,000 Units by mouth daily.  . diclofenac sodium (VOLTAREN) 1 % GEL Apply 2 g topically as needed (pain).    Marland Kitchen doxazosin (CARDURA) 2 MG tablet Take 2 mg by mouth daily.   Marland Kitchen HYDROcodone-acetaminophen (NORCO/VICODIN) 5-325 MG tablet Take 1 tablet by mouth every 6 (six) hours as needed for moderate pain.   Marland Kitchen levothyroxine (SYNTHROID) 150 MCG tablet TAKE ONE TABLET BY MOUTH ONE TIME DAILY BEFORE BREAKFAST   . Loratadine (CLARITIN PO) Take 10 mg by mouth as needed (allergies).    . NON FORMULARY HEMP OIL 500/0Z DAILY   . omeprazole (PRILOSEC) 20 MG capsule Take 20 mg by mouth daily.   .  propranolol ER (INDERAL LA) 80 MG 24 hr capsule Take 1 capsule (80 mg total) by mouth daily.   . simvastatin (ZOCOR) 40 MG tablet Take 1 tablet (40 mg total) by mouth daily.   . traMADol (ULTRAM) 50 MG tablet Take 1 tablet (50 mg total) by mouth every 12 (twelve) hours as needed for severe pain.   . [DISCONTINUED] nabumetone (RELAFEN) 500 MG tablet Take 1 tablet (500 mg total) by mouth 2 (two) times daily. (Patient not taking: Reported on 03/16/2019)    No facility-administered encounter medications on file as of 06/18/2019.      No Known Allergies    ROS: See pertinent positives and negatives per HPI.   Observations/Objective: Patient sounds cheerful and well on the phone. I do not appreciate any SOB. Speech and thought processing are grossly intact. Patient reported vitals:   Assessment and Plan:  1. DOE (dyspnea on exertion) - Ambulatory referral to Pulmonology - cardio (Dr. Einar Gip) will call pt this afternoon to schedule appt for echo  2. History of tobacco use - cont with budesonide and discussed proper use of inhaler - Ambulatory referral to Pulmonology   I did not refer this patient for an OV in the next 24 hours for this/these issue(s).  I discussed the assessment and treatment plan with the patient. The patient was provided an opportunity to ask questions and all were answered. The patient agreed with the plan and demonstrated an understanding of the instructions.   The patient was advised to call back or seek an in-person evaluation if the symptoms worsen or if the condition fails to improve as anticipated.  I provided 16 minutes of non-face-to-face time during this encounter.   Victor Median, DO

## 2019-06-18 NOTE — Addendum Note (Signed)
Addended by: Janan Ridge on: 06/18/2019 01:33 PM   Modules accepted: Orders

## 2019-07-02 ENCOUNTER — Encounter: Payer: Self-pay | Admitting: Pulmonary Disease

## 2019-07-02 ENCOUNTER — Other Ambulatory Visit: Payer: Self-pay

## 2019-07-02 ENCOUNTER — Other Ambulatory Visit: Payer: Self-pay | Admitting: *Deleted

## 2019-07-02 ENCOUNTER — Ambulatory Visit (INDEPENDENT_AMBULATORY_CARE_PROVIDER_SITE_OTHER): Payer: Medicare Other | Admitting: Pulmonary Disease

## 2019-07-02 ENCOUNTER — Ambulatory Visit (INDEPENDENT_AMBULATORY_CARE_PROVIDER_SITE_OTHER): Payer: Medicare Other

## 2019-07-02 VITALS — BP 138/56 | HR 65 | Temp 98.0°F | Ht 70.0 in | Wt 219.0 lb

## 2019-07-02 DIAGNOSIS — J432 Centrilobular emphysema: Secondary | ICD-10-CM | POA: Diagnosis not present

## 2019-07-02 DIAGNOSIS — R0602 Shortness of breath: Secondary | ICD-10-CM

## 2019-07-02 MED ORDER — ALBUTEROL SULFATE HFA 108 (90 BASE) MCG/ACT IN AERS: 2.0000 | INHALATION_SPRAY | Freq: Four times a day (QID) | RESPIRATORY_TRACT | 5 refills | Status: DC | PRN

## 2019-07-02 NOTE — Assessment & Plan Note (Signed)
Chest x-ray shows evidence of emphysema and this is consistent with his history of smoking.  However his symptom of episodic dyspnea has not consistent with this.  We will provide him with an albuterol inhaler as needed.  That he does not desaturate on exertion is reassuring.  He does not seem to have any risk factors for pulmonary embolism and the fact that his last episode was about a month ago makes me less inclined to consider this diagnosis. We will obtain PFTs to quantitate his lung function.  We will await results of his cardiac testing I wonder if his episodic dyspnea is in someway related to his vocal cord issues -he does not seem to have significant dysphonia or dysphagia at this time and has ENT follow-up for this

## 2019-07-02 NOTE — Patient Instructions (Signed)
Chest x-ray today. Ambulatory saturation.  Proceed with cardiac testing as planned  Based on above, we will decide about need for PFTs

## 2019-07-02 NOTE — Progress Notes (Signed)
Subjective:    Patient ID: Victor [name], male    DOB: 09-06-1936, 83 y.o.   MRN: [id number]  HPI    Chief Complaint  Patient presents with  . Consult    increased shortness of breath started in feb 2020 with exertion.   83 year old remote smoker presents for evaluation of dyspnea.  He reports episodic dyspnea, first such episode was in February 2020 when he was walking up a hill and had to stop to catch his breath.  He walks about 2 miles every day with his wife and denies coughing, wheezing or repeated chest colds.  He has only had this happen a couple of times since then. He reports another episode about a month ago when he was bending over laying plastic on the ground and felt like he had to breathe hard.  He does admit that he has cut down his exertion and now only walks 1 mile daily. He was given Pulmicort MDI by his PCP which is really not helped him much.  He reports a negative treadmill stress test a few years ago by Dr. Einar Gip.  An echocardiogram has been scheduled. He denies chest pain or diaphoresis  He has a history of laryngeal cancer and underwent radiation therapy and excision of left anterior vocal cord lesion by ENT in 2016, he required transient G-tube for about a month due to dysphagia. He has some hoarseness of voice but denies any residual dysphagia or coughing.  He smoked about a pack and 1/2/day for 40 years until he quit in 2005.  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 Chest x-ray personally reviewed which showed mild emphysema pattern with hyperinflation especially noted on the lateral view, no acute process   Past Medical History:  Diagnosis Date  . Arthritis   . Atherosclerosis   . Atherosclerotic PVD with intermittent claudication ([place])   . BPH (benign prostatic hyperplasia)   . Bulging lumbar disc   . Cancer ([place]) 11/12/14   vocal cord  carcinoma in situ   .  Carotid bruit   . Chronic kidney disease    chronic stage III  . DDD (degenerative disc disease), lumbar   . Dysphonia   . Dysplasia of true vocal cord   . GERD (gastroesophageal reflux disease)   . H/O carotid atherosclerosis    b/l  . Hyperlipidemia   . Occasional tremors    left hand managed with propranolol  . Peripheral vascular angioplasty status with implants and grafts   . Precancerous lesion 03/05/2018   premelanoma removed from back.   . Radiation 03/10/15- 04/18/16   Larynx  . Sleep apnea    hasn't used CPAP in years  . Spondylosis of lumbosacral region   . Thyroid disease      Past Surgical History:  Procedure Laterality Date  . APPENDECTOMY    . MOUTH SURGERY     tooth extraction  . THYROID SURGERY    . TONSILLECTOMY    . vocal cord biopsy  11/12/14   squamous cell carcinoma in situ    No Known Allergies  Social History   Socioeconomic History  . Marital status: Married    Spouse name: Not on file  . Number of children: Not on file  . Years of education: Not on file  . Highest education level: Not on file  Occupational History  . Not on file  Social  Needs  . Financial resource strain: Not on file  . Food insecurity    Worry: Not on file    Inability: Not on file  . Transportation needs    Medical: Not on file    Non-medical: Not on file  Tobacco Use  . Smoking status: Former Smoker    Packs/day: 2.00    Years: 51.00    Pack years: 102.00    Types: Cigarettes    Start date: 43    Quit date: 03/05/2004    Years since quitting: 15.3  . Smokeless tobacco: Never Used  Substance and Sexual Activity  . Alcohol use: Yes    Comment: one wine-based marjarita daily  . Drug use: No  . Sexual activity: Not on file  Lifestyle  . Physical activity    Days per week: Not on file    Minutes per session: Not on file  . Stress: Not on file  Relationships  . Social Herbalist on phone: Not on file    Gets together: Not on file    Attends  religious service: Not on file    Active member of club or organization: Not on file    Attends meetings of clubs or organizations: Not on file    Relationship status: Not on file  . Intimate partner violence    Fear of current or ex partner: Not on file    Emotionally abused: Not on file    Physically abused: Not on file    Forced sexual activity: Not on file  Other Topics Concern  . Not on file  Social History Narrative  . Not on file    No family history on file.   Review of Systems  Constitutional: Negative for fever and unexpected weight change.  HENT: Positive for sneezing. Negative for congestion, dental problem, ear pain, nosebleeds, postnasal drip, rhinorrhea, sinus pressure, sore throat and trouble swallowing.   Eyes: Negative for redness and itching.  Respiratory: Positive for shortness of breath. Negative for cough, chest tightness and wheezing.   Cardiovascular: Negative for palpitations and leg swelling.  Gastrointestinal: Negative for nausea and vomiting.  Genitourinary: Negative for dysuria.  Musculoskeletal: Negative for joint swelling.  Skin: Negative for rash.  Allergic/Immunologic: Negative.  Negative for environmental allergies, food allergies and immunocompromised state.  Neurological: Negative for headaches.  Hematological: Does not bruise/bleed easily.  Psychiatric/Behavioral: Negative for dysphoric mood. The patient is not nervous/anxious.        Objective:   Physical Exam   Gen. Pleasant, well-nourished, in no distress, normal affect ENT - no pallor,icterus, no post nasal drip Neck: No JVD, no thyromegaly, no carotid bruits Lungs: no use of accessory muscles, no dullness to percussion, decreased without rales or rhonchi  Cardiovascular: Rhythm regular, heart sounds  normal, no murmurs or gallops, no peripheral edema Abdomen: soft and non-tender, no hepatosplenomegaly, BS normal. Musculoskeletal: No deformities, no cyanosis or clubbing Neuro:   alert, non focal       Assessment & Plan:

## 2019-07-03 ENCOUNTER — Telehealth: Payer: Self-pay | Admitting: Pulmonary Disease

## 2019-07-03 MED ORDER — ALBUTEROL SULFATE HFA 108 (90 BASE) MCG/ACT IN AERS: 1.0000 | INHALATION_SPRAY | Freq: Four times a day (QID) | RESPIRATORY_TRACT | 3 refills | Status: DC | PRN

## 2019-07-03 NOTE — Telephone Encounter (Signed)
Received a PA request for patient's Ventolin inhaler. Reviewed patient's formulary and saw that [place] and [place]. Sent in [place] for [employer].

## 2019-07-17 ENCOUNTER — Ambulatory Visit (INDEPENDENT_AMBULATORY_CARE_PROVIDER_SITE_OTHER): Payer: Medicare Other

## 2019-07-17 ENCOUNTER — Other Ambulatory Visit: Payer: Self-pay

## 2019-07-17 DIAGNOSIS — R0609 Other forms of dyspnea: Secondary | ICD-10-CM

## 2019-07-17 DIAGNOSIS — R0600 Dyspnea, unspecified: Secondary | ICD-10-CM

## 2019-07-21 ENCOUNTER — Other Ambulatory Visit: Payer: Self-pay

## 2019-07-21 ENCOUNTER — Encounter (INDEPENDENT_AMBULATORY_CARE_PROVIDER_SITE_OTHER): Payer: Medicare Other | Admitting: Ophthalmology

## 2019-07-21 DIAGNOSIS — H353132 Nonexudative age-related macular degeneration, bilateral, intermediate dry stage: Secondary | ICD-10-CM

## 2019-07-21 DIAGNOSIS — H4322 Crystalline deposits in vitreous body, left eye: Secondary | ICD-10-CM | POA: Diagnosis not present

## 2019-07-21 DIAGNOSIS — H43813 Vitreous degeneration, bilateral: Secondary | ICD-10-CM | POA: Diagnosis not present

## 2019-07-22 DIAGNOSIS — H35372 Puckering of macula, left eye: Secondary | ICD-10-CM | POA: Diagnosis present

## 2019-07-22 NOTE — H&P (Signed)
Victor [name] is an 83 y.o. male.   Chief Complaint: loss of vision, distortion, floaters left eye HPI: reduced vision with distortion and floaters for 2 months.  Past Medical History:  Diagnosis Date  . Arthritis   . Atherosclerosis   . Atherosclerotic PVD with intermittent claudication ([place])   . BPH (benign prostatic hyperplasia)   . Bulging lumbar disc   . Cancer ([place]) 11/12/14   vocal cord  carcinoma in situ   . Carotid bruit   . Chronic kidney disease    chronic stage III  . DDD (degenerative disc disease), lumbar   . Dysphonia   . Dysplasia of true vocal cord   . GERD (gastroesophageal reflux disease)   . H/O carotid atherosclerosis    b/l  . Hyperlipidemia   . Occasional tremors    left hand managed with propranolol  . Peripheral vascular angioplasty status with implants and grafts   . Precancerous lesion 03/05/2018   premelanoma removed from back.   . Radiation 03/10/15- 04/18/16   Larynx  . Sleep apnea    hasn't used CPAP in years  . Spondylosis of lumbosacral region   . Thyroid disease     Past Surgical History:  Procedure Laterality Date  . APPENDECTOMY    . MOUTH SURGERY     tooth extraction  . THYROID SURGERY    . TONSILLECTOMY    . vocal cord biopsy  11/12/14   squamous cell carcinoma in situ    No family history on file. Social History:  reports that he quit smoking about 15 years ago. His smoking use included cigarettes. He started smoking about 66 years ago. He has a 102.00 pack-year smoking history. He has never used smokeless tobacco. He reports current alcohol use. He reports that he does not use drugs.  Allergies: No Known Allergies  No medications prior to admission.    Review of systems otherwise negative  There were no vitals taken for this visit.  Physical exam: Mental status: oriented x3. Eyes: See eye exam associated with this date of surgery in media tab.  Scanned in by scanning center Ears, Nose, Throat: within normal limits Neck:  Within Normal limits General: within normal limits Chest: Within normal limits Breast: deferred Heart: Within normal limits Abdomen: Within normal limits GU: deferred Extremities: within normal limits Skin: within normal limits  Assessment/Plan Epiretinal membrane left eye Plan: To [facility] for Pars plana vitrectomy, membrane peel, laser, gas injection left eye  Hayden Pedro 07/22/2019, 1:11 PM

## 2019-07-29 ENCOUNTER — Other Ambulatory Visit: Payer: Self-pay | Admitting: Family Medicine

## 2019-07-29 ENCOUNTER — Other Ambulatory Visit: Payer: Self-pay

## 2019-07-29 ENCOUNTER — Encounter: Payer: Self-pay | Admitting: Family Medicine

## 2019-07-29 MED ORDER — LEVOTHYROXINE SODIUM 150 MCG PO TABS: ORAL_TABLET | ORAL | 3 refills | Status: DC

## 2019-08-14 ENCOUNTER — Other Ambulatory Visit (HOSPITAL_COMMUNITY)
Admission: RE | Admit: 2019-08-14 | Discharge: 2019-08-14 | Disposition: A | Discharge status: Home / Self Care | Payer: Medicare Other | Source: Ambulatory Visit | Attending: Ophthalmology | Admitting: Ophthalmology

## 2019-08-14 ENCOUNTER — Other Ambulatory Visit: Payer: Self-pay

## 2019-08-14 ENCOUNTER — Encounter (HOSPITAL_COMMUNITY): Payer: Self-pay | Admitting: *Deleted

## 2019-08-14 DIAGNOSIS — Z20828 Contact with and (suspected) exposure to other viral communicable diseases: Secondary | ICD-10-CM | POA: Diagnosis not present

## 2019-08-14 DIAGNOSIS — Z01812 Encounter for preprocedural laboratory examination: Secondary | ICD-10-CM | POA: Insufficient documentation

## 2019-08-14 NOTE — Progress Notes (Addendum)
Mr Victor [name] denies chest pain or shortness. Patient states that he has not been exposed to Covid or have any s/s.  Patient will have Covid test today and then quarantine with his wife. Mr Washam was diagnosed with Sleep Apnea "years ago" and wore a  CPAP  For a year, then lost 60 lbs and does not need it any more. I instructed patient to bring his Abuterol inhaler with him.

## 2019-08-17 LAB — NOVEL CORONAVIRUS, NAA (HOSP ORDER, SEND-OUT TO REF LAB; TAT 18-24 HRS): SARS-CoV-2, NAA: NOT DETECTED

## 2019-08-18 ENCOUNTER — Encounter (HOSPITAL_COMMUNITY)
Admission: RE | Disposition: A | Discharge status: Home / Self Care | Payer: Self-pay | Source: Home / Self Care | Attending: Ophthalmology

## 2019-08-18 ENCOUNTER — Ambulatory Visit (HOSPITAL_COMMUNITY): Payer: Medicare Other | Admitting: Anesthesiology

## 2019-08-18 ENCOUNTER — Ambulatory Visit (HOSPITAL_COMMUNITY)
Admission: RE | Admit: 2019-08-18 | Discharge: 2019-08-19 | Disposition: A | Discharge status: Home / Self Care | Payer: Medicare Other | Attending: Ophthalmology | Admitting: Ophthalmology

## 2019-08-18 ENCOUNTER — Encounter (HOSPITAL_COMMUNITY): Payer: Self-pay

## 2019-08-18 ENCOUNTER — Other Ambulatory Visit: Payer: Self-pay

## 2019-08-18 DIAGNOSIS — K219 Gastro-esophageal reflux disease without esophagitis: Secondary | ICD-10-CM | POA: Insufficient documentation

## 2019-08-18 DIAGNOSIS — E890 Postprocedural hypothyroidism: Secondary | ICD-10-CM | POA: Diagnosis not present

## 2019-08-18 DIAGNOSIS — N183 Chronic kidney disease, stage 3 unspecified: Secondary | ICD-10-CM | POA: Diagnosis not present

## 2019-08-18 DIAGNOSIS — H4322 Crystalline deposits in vitreous body, left eye: Secondary | ICD-10-CM | POA: Insufficient documentation

## 2019-08-18 DIAGNOSIS — M1990 Unspecified osteoarthritis, unspecified site: Secondary | ICD-10-CM | POA: Insufficient documentation

## 2019-08-18 DIAGNOSIS — Z79890 Hormone replacement therapy (postmenopausal): Secondary | ICD-10-CM | POA: Insufficient documentation

## 2019-08-18 DIAGNOSIS — Z923 Personal history of irradiation: Secondary | ICD-10-CM | POA: Diagnosis not present

## 2019-08-18 DIAGNOSIS — Z7982 Long term (current) use of aspirin: Secondary | ICD-10-CM | POA: Diagnosis not present

## 2019-08-18 DIAGNOSIS — Z79899 Other long term (current) drug therapy: Secondary | ICD-10-CM | POA: Diagnosis not present

## 2019-08-18 DIAGNOSIS — G4730 Sleep apnea, unspecified: Secondary | ICD-10-CM | POA: Diagnosis not present

## 2019-08-18 DIAGNOSIS — H35372 Puckering of macula, left eye: Secondary | ICD-10-CM | POA: Insufficient documentation

## 2019-08-18 DIAGNOSIS — H43822 Vitreomacular adhesion, left eye: Secondary | ICD-10-CM

## 2019-08-18 DIAGNOSIS — Z87891 Personal history of nicotine dependence: Secondary | ICD-10-CM | POA: Diagnosis not present

## 2019-08-18 DIAGNOSIS — Z8521 Personal history of malignant neoplasm of larynx: Secondary | ICD-10-CM | POA: Insufficient documentation

## 2019-08-18 HISTORY — DX: Chronic obstructive pulmonary disease, unspecified: J44.9

## 2019-08-18 HISTORY — PX: PARS PLANA VITRECTOMY 27 GAUGE: SHX6738

## 2019-08-18 HISTORY — PX: DIODE LASER APPLICATION: SHX5798

## 2019-08-18 HISTORY — DX: Hypothyroidism, unspecified: E03.9

## 2019-08-18 HISTORY — PX: PARS PLANA VITRECTOMY: SHX2166

## 2019-08-18 HISTORY — PX: PHOTOCOAGULATION WITH LASER: SHX6027

## 2019-08-18 HISTORY — PX: MEMBRANE PEEL: SHX5967

## 2019-08-18 LAB — CBC
HCT: 45 % (ref 39.0–52.0)
Hemoglobin: 14.8 g/dL (ref 13.0–17.0)
MCH: 32.6 pg (ref 26.0–34.0)
MCHC: 32.9 g/dL (ref 30.0–36.0)
MCV: 99.1 fL (ref 80.0–100.0)
Platelets: 161 10*3/uL (ref 150–400)
RBC: 4.54 MIL/uL (ref 4.22–5.81)
RDW: 16.6 % — ABNORMAL HIGH (ref 11.5–15.5)
WBC: 8.7 10*3/uL (ref 4.0–10.5)
nRBC: 0 % (ref 0.0–0.2)

## 2019-08-18 LAB — BASIC METABOLIC PANEL
Anion gap: 8 (ref 5–15)
BUN: 11 mg/dL (ref 8–23)
CO2: 26 mmol/L (ref 22–32)
Calcium: 8.6 mg/dL — ABNORMAL LOW (ref 8.9–10.3)
Chloride: 107 mmol/L (ref 98–111)
Creatinine, Ser: 1.13 mg/dL (ref 0.61–1.24)
GFR calc Af Amer: >60 mL/min (ref >60)
GFR calc non Af Amer: >60 mL/min (ref >60)
Glucose, Bld: 97 mg/dL (ref 70–99)
Potassium: 4 mmol/L (ref 3.5–5.1)
Sodium: 141 mmol/L (ref 135–145)

## 2019-08-18 SURGERY — PARS PLANA VITRECTOMY 27 GAUGE
Anesthesia: General | Site: Eye | Laterality: Left

## 2019-08-18 MED ORDER — BACITRACIN-POLYMYXIN B 500-10000 UNIT/GM OP OINT
1.0000 "application " | TOPICAL_OINTMENT | Freq: Three times a day (TID) | OPHTHALMIC | Status: DC
  Filled 2019-08-18: qty 3.5

## 2019-08-18 MED ORDER — TETRACAINE HCL 0.5 % OP SOLN
2.0000 [drp] | Freq: Once | OPHTHALMIC | Status: DC
  Filled 2019-08-18: qty 4

## 2019-08-18 MED ORDER — TOBRAMYCIN 0.3 % OP OINT
TOPICAL_OINTMENT | OPHTHALMIC | Status: DC | PRN
  Administered 2019-08-18: 1 via OPHTHALMIC

## 2019-08-18 MED ORDER — DEXAMETHASONE SODIUM PHOSPHATE 10 MG/ML IJ SOLN
INTRAMUSCULAR | Status: DC | PRN
  Administered 2019-08-18: 10 mg

## 2019-08-18 MED ORDER — ONDANSETRON HCL 4 MG/2ML IJ SOLN: 4.0000 mg | Freq: Once | INTRAMUSCULAR | Status: DC | PRN

## 2019-08-18 MED ORDER — HYDROCODONE-ACETAMINOPHEN 5-325 MG PO TABS: 1.0000 | ORAL_TABLET | Freq: Four times a day (QID) | ORAL | Status: DC | PRN

## 2019-08-18 MED ORDER — PROPOFOL 10 MG/ML IV BOLUS
INTRAVENOUS | Status: AC
  Filled 2019-08-18: qty 20

## 2019-08-18 MED ORDER — ACETAMINOPHEN 325 MG PO TABS: 325.0000 mg | ORAL_TABLET | ORAL | Status: DC | PRN

## 2019-08-18 MED ORDER — CEFAZOLIN SODIUM-DEXTROSE 2-4 GM/100ML-% IV SOLN
2.0000 g | INTRAVENOUS | Status: AC
  Administered 2019-08-18: 2 g via INTRAVENOUS
  Filled 2019-08-18: qty 100

## 2019-08-18 MED ORDER — PANTOPRAZOLE SODIUM 40 MG PO TBEC
40.0000 mg | DELAYED_RELEASE_TABLET | Freq: Every day | ORAL | Status: DC
  Filled 2019-08-18: qty 1

## 2019-08-18 MED ORDER — PROPOFOL 10 MG/ML IV BOLUS
INTRAVENOUS | Status: DC | PRN
  Administered 2019-08-18: 150 mg via INTRAVENOUS

## 2019-08-18 MED ORDER — LACTATED RINGERS IV SOLN
INTRAVENOUS | Status: DC | PRN
  Administered 2019-08-18: 11:00:00 via INTRAVENOUS

## 2019-08-18 MED ORDER — FAMOTIDINE 20 MG PO TABS
20.0000 mg | ORAL_TABLET | Freq: Every day | ORAL | Status: DC
  Administered 2019-08-18: 20 mg via ORAL
  Filled 2019-08-18: qty 1

## 2019-08-18 MED ORDER — GATIFLOXACIN 0.5 % OP SOLN
1.0000 [drp] | Freq: Four times a day (QID) | OPHTHALMIC | Status: DC
  Filled 2019-08-18: qty 2.5

## 2019-08-18 MED ORDER — ROCURONIUM BROMIDE 10 MG/ML (PF) SYRINGE
PREFILLED_SYRINGE | INTRAVENOUS | Status: DC | PRN
  Administered 2019-08-18: 20 mg via INTRAVENOUS
  Administered 2019-08-18: 50 mg via INTRAVENOUS

## 2019-08-18 MED ORDER — SIMVASTATIN 20 MG PO TABS
40.0000 mg | ORAL_TABLET | Freq: Every day | ORAL | Status: DC
  Administered 2019-08-18: 40 mg via ORAL
  Filled 2019-08-18: qty 2

## 2019-08-18 MED ORDER — BSS IO SOLN
INTRAOCULAR | Status: AC
  Filled 2019-08-18: qty 15

## 2019-08-18 MED ORDER — LIDOCAINE HCL 2 % IJ SOLN
INTRAMUSCULAR | Status: AC
  Filled 2019-08-18: qty 20

## 2019-08-18 MED ORDER — FLUTICASONE PROPIONATE 50 MCG/ACT NA SUSP
1.0000 | Freq: Two times a day (BID) | NASAL | Status: DC
  Filled 2019-08-18: qty 16

## 2019-08-18 MED ORDER — LEVOTHYROXINE SODIUM 75 MCG PO TABS
150.0000 ug | ORAL_TABLET | Freq: Every day | ORAL | Status: DC
  Administered 2019-08-19: 150 ug via ORAL
  Filled 2019-08-18: qty 2

## 2019-08-18 MED ORDER — TRIAMCINOLONE ACETONIDE 40 MG/ML IJ SUSP
INTRAMUSCULAR | Status: AC
  Filled 2019-08-18: qty 5

## 2019-08-18 MED ORDER — 0.9 % SODIUM CHLORIDE (POUR BTL) OPTIME
TOPICAL | Status: DC | PRN
  Administered 2019-08-18: 11:00:00 1000 mL

## 2019-08-18 MED ORDER — LATANOPROST 0.005 % OP SOLN
1.0000 [drp] | Freq: Every day | OPHTHALMIC | Status: DC
  Filled 2019-08-18 (×2): qty 2.5

## 2019-08-18 MED ORDER — OXYCODONE HCL 5 MG/5ML PO SOLN: 5.0000 mg | Freq: Once | ORAL | Status: AC | PRN

## 2019-08-18 MED ORDER — DEXAMETHASONE SODIUM PHOSPHATE 10 MG/ML IJ SOLN
INTRAMUSCULAR | Status: DC | PRN
  Administered 2019-08-18: 4 mg via INTRAVENOUS

## 2019-08-18 MED ORDER — ALBUTEROL SULFATE (2.5 MG/3ML) 0.083% IN NEBU: 3.0000 mL | INHALATION_SOLUTION | Freq: Four times a day (QID) | RESPIRATORY_TRACT | Status: DC | PRN

## 2019-08-18 MED ORDER — DEXAMETHASONE SODIUM PHOSPHATE 10 MG/ML IJ SOLN
INTRAMUSCULAR | Status: AC
  Filled 2019-08-18: qty 1

## 2019-08-18 MED ORDER — STERILE WATER FOR INJECTION IJ SOLN
INTRAMUSCULAR | Status: AC
  Filled 2019-08-18: qty 20

## 2019-08-18 MED ORDER — FENTANYL CITRATE (PF) 100 MCG/2ML IJ SOLN: 25.0000 ug | INTRAMUSCULAR | Status: DC | PRN

## 2019-08-18 MED ORDER — DOXAZOSIN MESYLATE 2 MG PO TABS
2.0000 mg | ORAL_TABLET | Freq: Every day | ORAL | Status: DC
  Administered 2019-08-18: 2 mg via ORAL
  Filled 2019-08-18: qty 1

## 2019-08-18 MED ORDER — PREDNISOLONE ACETATE 1 % OP SUSP
1.0000 [drp] | Freq: Four times a day (QID) | OPHTHALMIC | Status: DC
  Filled 2019-08-18: qty 5

## 2019-08-18 MED ORDER — BUPIVACAINE HCL (PF) 0.75 % IJ SOLN
INTRAMUSCULAR | Status: AC
  Filled 2019-08-18: qty 10

## 2019-08-18 MED ORDER — HYDROCODONE-ACETAMINOPHEN 5-325 MG PO TABS: 1.0000 | ORAL_TABLET | ORAL | Status: DC | PRN

## 2019-08-18 MED ORDER — POLYMYXIN B SULFATE 500000 UNITS IJ SOLR
INTRAMUSCULAR | Status: AC
  Filled 2019-08-18: qty 500000

## 2019-08-18 MED ORDER — LIDOCAINE 2% (20 MG/ML) 5 ML SYRINGE
INTRAMUSCULAR | Status: DC | PRN
  Administered 2019-08-18: 100 mg via INTRAVENOUS

## 2019-08-18 MED ORDER — MORPHINE SULFATE (PF) 2 MG/ML IV SOLN: 1.0000 mg | INTRAVENOUS | Status: DC | PRN

## 2019-08-18 MED ORDER — MAGNESIUM HYDROXIDE 400 MG/5ML PO SUSP: 15.0000 mL | Freq: Four times a day (QID) | ORAL | Status: DC | PRN

## 2019-08-18 MED ORDER — CEFTAZIDIME 1 G IJ SOLR
INTRAMUSCULAR | Status: AC
  Filled 2019-08-18: qty 1

## 2019-08-18 MED ORDER — BACITRACIN-POLYMYXIN B 500-10000 UNIT/GM OP OINT
TOPICAL_OINTMENT | OPHTHALMIC | Status: AC
  Filled 2019-08-18: qty 3.5

## 2019-08-18 MED ORDER — PROPRANOLOL HCL ER 80 MG PO CP24
80.0000 mg | ORAL_CAPSULE | Freq: Every day | ORAL | Status: DC
  Filled 2019-08-18: qty 1

## 2019-08-18 MED ORDER — SUGAMMADEX SODIUM 200 MG/2ML IV SOLN
INTRAVENOUS | Status: DC | PRN
  Administered 2019-08-18: 300 mg via INTRAVENOUS

## 2019-08-18 MED ORDER — DORZOLAMIDE HCL-TIMOLOL MAL 2-0.5 % OP SOLN
1.0000 [drp] | Freq: Two times a day (BID) | OPHTHALMIC | Status: DC
  Administered 2019-08-18: 1 [drp] via OPHTHALMIC
  Filled 2019-08-18: qty 10

## 2019-08-18 MED ORDER — ONDANSETRON HCL 4 MG/2ML IJ SOLN
4.0000 mg | Freq: Four times a day (QID) | INTRAMUSCULAR | Status: DC
  Administered 2019-08-18 – 2019-08-19 (×3): 4 mg via INTRAVENOUS
  Filled 2019-08-18 (×3): qty 2

## 2019-08-18 MED ORDER — HYPROMELLOSE (GONIOSCOPIC) 2.5 % OP SOLN
OPHTHALMIC | Status: AC
  Filled 2019-08-18: qty 15

## 2019-08-18 MED ORDER — SODIUM CHLORIDE (PF) 0.9 % IJ SOLN
INTRAMUSCULAR | Status: AC
  Filled 2019-08-18: qty 10

## 2019-08-18 MED ORDER — BRIMONIDINE TARTRATE 0.2 % OP SOLN
1.0000 [drp] | Freq: Two times a day (BID) | OPHTHALMIC | Status: DC
  Filled 2019-08-18: qty 5

## 2019-08-18 MED ORDER — OXYCODONE HCL 5 MG PO TABS
ORAL_TABLET | ORAL | Status: AC
  Filled 2019-08-18: qty 1

## 2019-08-18 MED ORDER — EPINEPHRINE PF 1 MG/ML IJ SOLN
INTRAOCULAR | Status: DC | PRN
  Administered 2019-08-18: 500.3 mL

## 2019-08-18 MED ORDER — SODIUM HYALURONATE 10 MG/ML IO SOLN
INTRAOCULAR | Status: AC
  Filled 2019-08-18: qty 0.85

## 2019-08-18 MED ORDER — BACITRACIN-POLYMYXIN B 500-10000 UNIT/GM OP OINT
TOPICAL_OINTMENT | OPHTHALMIC | Status: DC | PRN
  Administered 2019-08-18: 1 via OPHTHALMIC

## 2019-08-18 MED ORDER — DORZOLAMIDE HCL 2 % OP SOLN
1.0000 [drp] | Freq: Three times a day (TID) | OPHTHALMIC | Status: DC
  Filled 2019-08-18: qty 10

## 2019-08-18 MED ORDER — TRAMADOL HCL 50 MG PO TABS
50.0000 mg | ORAL_TABLET | Freq: Two times a day (BID) | ORAL | Status: DC | PRN
  Administered 2019-08-18: 50 mg via ORAL
  Filled 2019-08-18: qty 1

## 2019-08-18 MED ORDER — TROPICAMIDE 1 % OP SOLN
1.0000 [drp] | OPHTHALMIC | Status: AC | PRN
  Administered 2019-08-18 (×3): 1 [drp] via OPHTHALMIC
  Filled 2019-08-18: qty 15

## 2019-08-18 MED ORDER — ATROPINE SULFATE 1 % OP SOLN
OPHTHALMIC | Status: AC
  Filled 2019-08-18: qty 5

## 2019-08-18 MED ORDER — OXYCODONE HCL 5 MG PO TABS
5.0000 mg | ORAL_TABLET | Freq: Once | ORAL | Status: AC | PRN
  Administered 2019-08-18: 5 mg via ORAL

## 2019-08-18 MED ORDER — FENTANYL CITRATE (PF) 250 MCG/5ML IJ SOLN
INTRAMUSCULAR | Status: DC | PRN
  Administered 2019-08-18: 50 ug via INTRAVENOUS

## 2019-08-18 MED ORDER — FENTANYL CITRATE (PF) 250 MCG/5ML IJ SOLN
INTRAMUSCULAR | Status: AC
  Filled 2019-08-18: qty 5

## 2019-08-18 MED ORDER — STERILE WATER FOR INJECTION IJ SOLN
INTRAMUSCULAR | Status: DC | PRN
  Administered 2019-08-18: 20 mL

## 2019-08-18 MED ORDER — ACETAZOLAMIDE SODIUM 500 MG IJ SOLR
500.0000 mg | Freq: Once | INTRAMUSCULAR | Status: AC
  Administered 2019-08-19: 500 mg via INTRAVENOUS
  Filled 2019-08-18: qty 500

## 2019-08-18 MED ORDER — MEPERIDINE HCL 25 MG/ML IJ SOLN: 6.2500 mg | INTRAMUSCULAR | Status: DC | PRN

## 2019-08-18 MED ORDER — SODIUM HYALURONATE 10 MG/ML IO SOLN
INTRAOCULAR | Status: DC | PRN
  Administered 2019-08-18: 0.85 mL via INTRAOCULAR

## 2019-08-18 MED ORDER — BSS PLUS IO SOLN
INTRAOCULAR | Status: AC
  Filled 2019-08-18: qty 500

## 2019-08-18 MED ORDER — TEMAZEPAM 15 MG PO CAPS: 15.0000 mg | ORAL_CAPSULE | Freq: Every evening | ORAL | Status: DC | PRN

## 2019-08-18 MED ORDER — ROCURONIUM BROMIDE 10 MG/ML (PF) SYRINGE
PREFILLED_SYRINGE | INTRAVENOUS | Status: AC
  Filled 2019-08-18: qty 10

## 2019-08-18 MED ORDER — CYCLOPENTOLATE HCL 1 % OP SOLN
1.0000 [drp] | OPHTHALMIC | Status: AC | PRN
  Administered 2019-08-18 (×3): 1 [drp] via OPHTHALMIC
  Filled 2019-08-18: qty 2

## 2019-08-18 MED ORDER — SODIUM CHLORIDE 0.45 % IV SOLN
INTRAVENOUS | Status: DC
  Administered 2019-08-18 (×2): via INTRAVENOUS

## 2019-08-18 MED ORDER — ACETAZOLAMIDE SODIUM 500 MG IJ SOLR
INTRAMUSCULAR | Status: AC
  Filled 2019-08-18: qty 500

## 2019-08-18 MED ORDER — LIDOCAINE 2% (20 MG/ML) 5 ML SYRINGE
INTRAMUSCULAR | Status: AC
  Filled 2019-08-18: qty 5

## 2019-08-18 MED ORDER — BSS IO SOLN
INTRAOCULAR | Status: DC | PRN
  Administered 2019-08-18: 15 mL via INTRAOCULAR

## 2019-08-18 MED ORDER — LATANOPROST 0.005 % OP SOLN
1.0000 [drp] | Freq: Every day | OPHTHALMIC | Status: DC
  Administered 2019-08-18: 1 [drp] via OPHTHALMIC
  Filled 2019-08-18: qty 2.5

## 2019-08-18 MED ORDER — SODIUM CHLORIDE 0.9 % IV SOLN
INTRAVENOUS | Status: DC
  Administered 2019-08-18: 10:00:00 via INTRAVENOUS

## 2019-08-18 MED ORDER — SODIUM CHLORIDE 0.9 % IV SOLN
INTRAVENOUS | Status: DC | PRN
  Administered 2019-08-18: 20 ug/min via INTRAVENOUS

## 2019-08-18 MED ORDER — BUPIVACAINE HCL (PF) 0.75 % IJ SOLN
INTRAMUSCULAR | Status: DC | PRN
  Administered 2019-08-18: 10 mL

## 2019-08-18 MED ORDER — HYALURONIDASE HUMAN 150 UNIT/ML IJ SOLN
INTRAMUSCULAR | Status: AC
  Filled 2019-08-18: qty 1

## 2019-08-18 MED ORDER — EPINEPHRINE PF 1 MG/ML IJ SOLN
INTRAMUSCULAR | Status: AC
  Filled 2019-08-18: qty 1

## 2019-08-18 MED ORDER — PHENYLEPHRINE HCL 2.5 % OP SOLN
1.0000 [drp] | OPHTHALMIC | Status: AC | PRN
  Administered 2019-08-18 (×3): 1 [drp] via OPHTHALMIC
  Filled 2019-08-18: qty 2

## 2019-08-18 MED ORDER — ONDANSETRON HCL 4 MG/2ML IJ SOLN
INTRAMUSCULAR | Status: DC | PRN
  Administered 2019-08-18: 4 mg via INTRAVENOUS

## 2019-08-18 MED ORDER — GATIFLOXACIN 0.5 % OP SOLN
1.0000 [drp] | OPHTHALMIC | Status: AC | PRN
  Administered 2019-08-18 (×3): 1 [drp] via OPHTHALMIC
  Filled 2019-08-18: qty 2.5

## 2019-08-18 SURGICAL SUPPLY — 84 items
APPLICATOR DR MATTHEWS STRL (MISCELLANEOUS) IMPLANT
BAND WRIST GAS GREEN (MISCELLANEOUS) IMPLANT
BLADE EYE CATARACT 19 1.4 BEAV (BLADE) IMPLANT
BLADE MVR KNIFE 19G (BLADE) IMPLANT
BLADE MVR KNIFE 20G (BLADE) IMPLANT
BNDG EYE OVAL (GAUZE/BANDAGES/DRESSINGS) ×1 IMPLANT
CABLE BIPOLOR RESECTION CORD (MISCELLANEOUS) IMPLANT
CANNULA ANTERIOR CHAMBER 27GA (MISCELLANEOUS) IMPLANT
CANNULA DUAL BORE 23G (CANNULA) IMPLANT
CANNULA TROCAR 25G 6 VLV (OPHTHALMIC) IMPLANT
CANNULA TROCAR 25GA VLV (OPHTHALMIC) IMPLANT
CANNULA VLV SOFT TIP 27G (OPHTHALMIC) ×1 IMPLANT
CANNULA VLV SOFT TIP 27GA (OPHTHALMIC) ×2 IMPLANT
COTTONBALL LRG STERILE PKG (GAUZE/BANDAGES/DRESSINGS) ×6 IMPLANT
COVER MAYO STAND STRL (DRAPES) IMPLANT
COVER WAND RF STERILE (DRAPES) ×1 IMPLANT
DRAPE INCISE 51X51 W/FILM STRL (DRAPES) IMPLANT
DRAPE OPHTHALMIC 77X100 STRL (CUSTOM PROCEDURE TRAY) ×2 IMPLANT
FILTER BLUE MILLIPORE (MISCELLANEOUS) IMPLANT
FILTER STRAW FLUID ASPIR (MISCELLANEOUS) IMPLANT
FORCEPS ECKARDT ILM 25G SERR (OPHTHALMIC RELATED) IMPLANT
FORCEPS GRIESHABER ILM 27G (INSTRUMENTS) ×1 IMPLANT
GAS AUTO FILL CONSTEL (OPHTHALMIC)
GAS AUTO FILL CONSTELLATION (OPHTHALMIC) IMPLANT
GAS WRIST BAND GREEN (MISCELLANEOUS)
GLOVE SS BIOGEL STRL SZ 6.5 (GLOVE) ×1 IMPLANT
GLOVE SS BIOGEL STRL SZ 7 (GLOVE) ×1 IMPLANT
GLOVE SUPERSENSE BIOGEL SZ 6.5 (GLOVE) ×2
GLOVE SUPERSENSE BIOGEL SZ 7 (GLOVE) ×2
GLOVE SURG 8.5 LATEX PF (GLOVE) ×3 IMPLANT
GOWN STRL REUS W/ TWL LRG LVL3 (GOWN DISPOSABLE) ×3 IMPLANT
GOWN STRL REUS W/TWL LRG LVL3 (GOWN DISPOSABLE) ×3
HANDLE PNEUMATIC FOR CONSTEL (OPHTHALMIC) ×1 IMPLANT
KIT BASIN OR (CUSTOM PROCEDURE TRAY) ×2 IMPLANT
MICROPICK 25G (MISCELLANEOUS)
NDL 18GX1X1/2 (RX/OR ONLY) (NEEDLE) ×1 IMPLANT
NDL 25GX 5/8IN NON SAFETY (NEEDLE) IMPLANT
NDL FILTER BLUNT 18X1 1/2 (NEEDLE) ×1 IMPLANT
NDL HYPO 30X.5 LL (NEEDLE) IMPLANT
NDL PRECISIONGLIDE 27X1.5 (NEEDLE) ×1 IMPLANT
NEEDLE 18GX1X1/2 (RX/OR ONLY) (NEEDLE) ×2 IMPLANT
NEEDLE 25GX 5/8IN NON SAFETY (NEEDLE) IMPLANT
NEEDLE FILTER BLUNT 18X 1/2SAF (NEEDLE) ×1
NEEDLE FILTER BLUNT 18X1 1/2 (NEEDLE) ×1 IMPLANT
NEEDLE HYPO 30X.5 LL (NEEDLE) IMPLANT
NEEDLE PRECISIONGLIDE 27X1.5 (NEEDLE) ×2 IMPLANT
NS IRRIG 1000ML POUR BTL (IV SOLUTION) ×2 IMPLANT
PACK VITRECTOMY CUSTOM (CUSTOM PROCEDURE TRAY) ×2 IMPLANT
PAD ARMBOARD 7.5X6 YLW CONV (MISCELLANEOUS) ×4 IMPLANT
PAK VITRECTOMY PIK  27GA (OPHTHALMIC) ×1
PAK VITRECTOMY PIK 27GA (OPHTHALMIC) ×1 IMPLANT
PENCIL BIPOLAR 25GA STR DISP (OPHTHALMIC RELATED) IMPLANT
PIC ILLUMINATED 25G (OPHTHALMIC) ×2
PICK MICROPICK 25G (MISCELLANEOUS) IMPLANT
PIK ILLUMINATED 25G (OPHTHALMIC) ×1 IMPLANT
PROBE DIATHERMY DSP 27GA (MISCELLANEOUS) ×2 IMPLANT
PROBE LASER ILLUM ARTICUL 27G (OPHTHALMIC) ×1 IMPLANT
PROBE LASER ILLUM FLEX 27GA (OPHTHALMIC) ×2 IMPLANT
PROBE LASER ILLUM FLEX CVD 25G (OPHTHALMIC) IMPLANT
REPL STRA BRUSH NDL (NEEDLE) IMPLANT
REPL STRA BRUSH NEEDLE (NEEDLE) IMPLANT
RESERVOIR BACK FLUSH (MISCELLANEOUS) IMPLANT
ROLLS DENTAL (MISCELLANEOUS) ×4 IMPLANT
SCISSORS TIP ADVANCED DSP 25GA (INSTRUMENTS) IMPLANT
SCRAPER DIAMOND 25GA (OPHTHALMIC RELATED) IMPLANT
SCRAPER DIAMOND DUST MEMBRANE (MISCELLANEOUS) IMPLANT
SHIELD EYE LENSE ONLY DISP (GAUZE/BANDAGES/DRESSINGS) ×1 IMPLANT
SPONGE SURGIFOAM ABS GEL 12-7 (HEMOSTASIS) ×2 IMPLANT
STOPCOCK 4 WAY LG BORE MALE ST (IV SETS) IMPLANT
SUT CHROMIC 7 0 TG140 8 (SUTURE) IMPLANT
SUT ETHILON 10 0 CS140 6 (SUTURE) IMPLANT
SUT ETHILON 9 0 TG140 8 (SUTURE) IMPLANT
SUT POLY NON ABSORB 10-0 8 STR (SUTURE) IMPLANT
SUT SILK 4 0 RB 1 (SUTURE) IMPLANT
SUT VICRYL 7 0 TG140 8 (SUTURE) ×1 IMPLANT
SYR 10ML LL (SYRINGE) IMPLANT
SYR 20ML LL LF (SYRINGE) ×2 IMPLANT
SYR 5ML LL (SYRINGE) IMPLANT
SYR BULB 3OZ (MISCELLANEOUS) ×2 IMPLANT
SYR TB 1ML LUER SLIP (SYRINGE) ×2 IMPLANT
TOWEL GREEN STERILE FF (TOWEL DISPOSABLE) ×2 IMPLANT
TUBING HIGH PRESS EXTEN 6IN (TUBING) IMPLANT
WATER STERILE IRR 1000ML POUR (IV SOLUTION) ×2 IMPLANT
WIPE INSTRUMENT VISIWIPE 73X73 (MISCELLANEOUS) IMPLANT

## 2019-08-18 NOTE — Plan of Care (Signed)
  Problem: Activity: Goal: Risk for activity intolerance will decrease Outcome: Progressing   

## 2019-08-18 NOTE — Anesthesia Procedure Notes (Signed)
Procedure Name: Intubation Date/Time: 08/18/2019 11:11 AM Performed by: Amadeo Garnet, CRNA Pre-anesthesia Checklist: Patient identified, Suction available, Emergency Drugs available and Patient being monitored Patient Re-evaluated:Patient Re-evaluated prior to induction Oxygen Delivery Method: Circle system utilized Preoxygenation: Pre-oxygenation with 100% oxygen Induction Type: IV induction Ventilation: Mask ventilation without difficulty Laryngoscope Size: Mac and 4 Grade View: Grade I Tube type: Oral Tube size: 7.5 mm Number of attempts: 1 Airway Equipment and Method: Stylet Secured at: 22 cm Tube secured with: Tape Dental Injury: Teeth and Oropharynx as per pre-operative assessment

## 2019-08-18 NOTE — Op Note (Signed)
NAMEEITAN, DOUBLEDAY MEDICAL RECORD PF:[id number] ACCOUNT [id number] DATE OF BIRTH:07-30-1936 FACILITY: [facility] LOCATION: [place], [place]  OPERATIVE REPORT  DATE OF PROCEDURE:  08/18/2019  ADMISSION DIAGNOSIS:  Preretinal fibrosis and asteroid hyalosis, left eye.  PROCEDURES:  Pars plana vitrectomy, retinal photocoagulation, membrane peel, gas fluid exchange in the left eye.  SURGEON:  Tempie Hoist, MD  ASSISTANT:  Deatra Ina, S.A.  ANESTHESIA:  General.  DESCRIPTION OF PROCEDURE:  Usual prep and drape.  The indirect ophthalmoscope laser was moved into place, 686 burns were placed around the retinal periphery with a power of 500 milliwatts 1000 microns each and 0.1 seconds each.  Attention was carried to  the pars plana area where and 27-gauge trocars were placed at 10, 2 and 4 o'clock.  Provisc placed on the corneal surface and the BIOM viewing system moved into place.  Pars plana vitrectomy was performed beginning just behind the pseudophacos where  dense white material was seen.  This was carefully removed under low suction and rapid cutting.  The vitrectomy was carried posteriorly in the mid vitreous where dense white material was seen.  This was carefully removed under low suction and rapid  cutting in a core fashion down to the macular surface.  The vitrectomy was carried out in the mid periphery where the dense white material was also seen.  This was carefully removed under low suction and rapid cutting in the mid periphery and then in the  far periphery.  The attention was carried to the macular region at this point.  The 27-gauge cutter was used to engage the posterior hyaloid and the epiretinal membrane.  The silicone tipped suction line was used to engage the shiny membrane and lift it  free from its attachments to the macular region.  The 27-gauge cutter was repositioned and the cutter was used to push the membrane until it opened then the cutter was used  to elevate the membrane and remove it from the macular surface.  Many drusen  were seen.  The macula was in good condition once the membrane was elevated and lifted.  The vitrectomy was carried into the periphery where posterior membranes were removed.  Additional vitrectomy was carried out into the far periphery with the super  wide viewing system.  The endolaser was positioned in the eye, 307 burns were placed around the retinal periphery.  The power was 300 milliwatts 1000 microns each and 0.1 seconds each.  A 30% gas fluid exchange was carried out.  The instruments were  removed from the eye.  The wounds were tested and found to be secure.  Polymyxin and ceftazidime were rinsed around the globe for antibiotic coverage.  Decadron 10 mg was injected into the lower subconjunctival space.  Marcaine was injected around the  globe for postoperative pain.  Closing pressure was 15 with a Bar[place]quer tonometer.  TobraDex ophthalmic ointment, a patch and a shield were placed.  The patient is awakened and taken to recovery in satisfactory condition.  TN/NUANCE  D:08/18/2019 T:08/18/2019 JOB:[id number]

## 2019-08-18 NOTE — Transfer of Care (Signed)
Immediate Anesthesia Transfer of Care Note  Patient: Victor [name]  Procedure(s) Performed: PARS PLANA VITRECTOMY 27 GAUGE (Left Eye) PHOTOCOAGULATION WITH LASER (Left Eye) DIODE LASER APPLICATION (Left Eye) MEMBRANE PEEL (Left Eye)  Patient Location: PACU  Anesthesia Type:General  Level of Consciousness: drowsy  Airway & Oxygen Therapy: Patient Spontanous Breathing and Patient connected to nasal cannula oxygen  Post-op Assessment: Report given to RN and Post -op Vital signs reviewed and stable  Post vital signs: Reviewed and stable  Last Vitals:  Vitals Value Taken Time  BP 143/63 08/18/19 1222  Temp    Pulse 61 08/18/19 1223  Resp 14 08/18/19 1223  SpO2 100 % 08/18/19 1223  Vitals shown include unvalidated device data.  Last Pain:  Vitals:   08/18/19 1016  TempSrc: Oral  PainSc:          Complications: No apparent anesthesia complications

## 2019-08-18 NOTE — Brief Op Note (Signed)
08/18/2019  12:16 PM  PATIENT:  Victor [name]  83 y.o. 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  Assistant:  Deatra Ina SA    Anesthesia: General  Specimen: none  Estimated blood loss:  1cc  Complications: none  Patient sent to PACU in good condition  Composed by Hayden Pedro MD  Dictation number: [id number]

## 2019-08-18 NOTE — Anesthesia Preprocedure Evaluation (Addendum)
Anesthesia Evaluation  Patient identified by MRN, date of birth, ID band Patient awake    Reviewed: Allergy & Precautions, NPO status , Patient's Chart, lab work & pertinent test results, reviewed documented beta blocker date and time   Airway Mallampati: II  TM Distance: >3 FB Neck ROM: Full    Dental  (+) Dental Advisory Given, Partial Upper, Missing   Pulmonary neg pulmonary ROS, former smoker,    Pulmonary exam normal breath sounds clear to auscultation       Cardiovascular hypertension, Pt. on home beta blockers + Peripheral Vascular Disease  Normal cardiovascular exam Rhythm:Regular Rate:Normal     Neuro/Psych negative neurological ROS  negative psych ROS   GI/Hepatic Neg liver ROS, GERD  ,  Endo/Other  negative endocrine ROS  Renal/GU Renal disease  negative genitourinary   Musculoskeletal negative musculoskeletal ROS (+)   Abdominal   Peds negative pediatric ROS (+)  Hematology negative hematology ROS (+)   Anesthesia Other Findings   Reproductive/Obstetrics negative OB ROS                           Anesthesia Physical Anesthesia Plan  ASA: III  Anesthesia Plan: General   Post-op Pain Management:    Induction: Intravenous  PONV Risk Score and Plan: 3 and Ondansetron, Dexamethasone and Treatment may vary due to age or medical condition  Airway Management Planned: Oral ETT  Additional Equipment: None  Intra-op Plan:   Post-operative Plan: Extubation in OR  Informed Consent: I have reviewed the patients History and Physical, chart, labs and discussed the procedure including the risks, benefits and alternatives for the proposed anesthesia with the patient or authorized representative who has indicated his/her understanding and acceptance.     Dental advisory given  Plan Discussed with: CRNA  Anesthesia Plan Comments:        Anesthesia Quick Evaluation

## 2019-08-18 NOTE — H&P (Signed)
I examined the patient today and there is no change in the medical status 

## 2019-08-19 ENCOUNTER — Encounter (HOSPITAL_COMMUNITY): Payer: Self-pay | Admitting: Ophthalmology

## 2019-08-19 DIAGNOSIS — H4322 Crystalline deposits in vitreous body, left eye: Secondary | ICD-10-CM | POA: Diagnosis not present

## 2019-08-19 MED ORDER — GATIFLOXACIN 0.5 % OP SOLN: 1.0000 [drp] | Freq: Four times a day (QID) | OPHTHALMIC | Status: DC

## 2019-08-19 MED ORDER — PREDNISOLONE ACETATE 1 % OP SUSP: 1.0000 [drp] | Freq: Four times a day (QID) | OPHTHALMIC | 0 refills | Status: DC

## 2019-08-19 MED ORDER — BACITRACIN-POLYMYXIN B 500-10000 UNIT/GM OP OINT: 1.0000 "application " | TOPICAL_OINTMENT | Freq: Three times a day (TID) | OPHTHALMIC | 0 refills | Status: DC

## 2019-08-19 NOTE — Discharge Summary (Signed)
Discharge summary not needed on [facility] patients per medical records. 

## 2019-08-19 NOTE — Progress Notes (Signed)
08/19/2019, 6:44 AM  Mental Status:  Awake, Alert, Oriented  Anterior segment: Cornea  Clear    Anterior Chamber Clear    Lens:   Clear, IOL Sub conj hemorrhage nasally  Intra Ocular Pressure 09 mmHg with Tonopen  Vitreous: Clear 20%gas bubble  Retina:  Attached Good laser reaction   Impression: Excellent result Retina attached   Final Diagnosis: Principal Problem:   Epiretinal membrane (ERM) of left eye   Plan: start post operative eye drops.  Discharge to home.  Give post operative instructions  Victor [name] 08/19/2019, 6:44 AM

## 2019-08-20 NOTE — Anesthesia Postprocedure Evaluation (Signed)
Anesthesia Post Note  Patient: Victor [name]  Procedure(s) Performed: PARS PLANA VITRECTOMY 27 GAUGE (Left Eye) PHOTOCOAGULATION WITH LASER (Left Eye) DIODE LASER APPLICATION (Left Eye) MEMBRANE PEEL (Left Eye)     Patient location during evaluation: PACU Anesthesia Type: General Level of consciousness: sedated and patient cooperative Pain management: pain level controlled Vital Signs Assessment: post-procedure vital signs reviewed and stable Respiratory status: spontaneous breathing Cardiovascular status: stable Anesthetic complications: no    Last Vitals:  Vitals:   08/18/19 2055 08/19/19 0534  BP:  (!) 133/58  Pulse: 88 67  Resp:  14  Temp:  36.7 C  SpO2: 96% 94%    Last Pain:  Vitals:   08/19/19 0534  TempSrc: Oral  PainSc:                  Nolon Nations

## 2019-08-25 ENCOUNTER — Other Ambulatory Visit: Payer: Self-pay

## 2019-08-25 ENCOUNTER — Encounter (INDEPENDENT_AMBULATORY_CARE_PROVIDER_SITE_OTHER): Payer: Medicare Other | Admitting: Ophthalmology

## 2019-08-25 DIAGNOSIS — H43822 Vitreomacular adhesion, left eye: Secondary | ICD-10-CM

## 2019-08-26 ENCOUNTER — Encounter: Payer: Self-pay | Admitting: Family Medicine

## 2019-08-26 MED ORDER — DOXAZOSIN MESYLATE 2 MG PO TABS: 2.0000 mg | ORAL_TABLET | Freq: Every day | ORAL | 3 refills | Status: DC

## 2019-09-16 ENCOUNTER — Encounter (INDEPENDENT_AMBULATORY_CARE_PROVIDER_SITE_OTHER): Payer: Medicare Other | Admitting: Ophthalmology

## 2019-09-16 DIAGNOSIS — H35372 Puckering of macula, left eye: Secondary | ICD-10-CM

## 2019-09-17 ENCOUNTER — Encounter: Payer: Self-pay | Admitting: Family Medicine

## 2019-09-17 ENCOUNTER — Other Ambulatory Visit: Payer: Self-pay

## 2019-09-17 ENCOUNTER — Ambulatory Visit (INDEPENDENT_AMBULATORY_CARE_PROVIDER_SITE_OTHER): Payer: Medicare Other | Admitting: Family Medicine

## 2019-09-17 VITALS — BP 122/60 | HR 72 | Temp 98.3°F | Ht 70.0 in | Wt 216.0 lb

## 2019-09-17 DIAGNOSIS — R42 Dizziness and giddiness: Secondary | ICD-10-CM

## 2019-09-17 DIAGNOSIS — E538 Deficiency of other specified B group vitamins: Secondary | ICD-10-CM | POA: Diagnosis not present

## 2019-09-17 DIAGNOSIS — E890 Postprocedural hypothyroidism: Secondary | ICD-10-CM

## 2019-09-17 LAB — CBC
HCT: 41.9 % (ref 39.0–52.0)
Hemoglobin: 13.8 g/dL (ref 13.0–17.0)
MCHC: 33 g/dL (ref 30.0–36.0)
MCV: 96.7 fl (ref 78.0–100.0)
Platelets: 154 10*3/uL (ref 150.0–400.0)
RBC: 4.33 Mil/uL (ref 4.22–5.81)
RDW: 18.5 % — ABNORMAL HIGH (ref 11.5–15.5)
WBC: 8.7 10*3/uL (ref 4.0–10.5)

## 2019-09-17 LAB — BASIC METABOLIC PANEL
BUN: 12 mg/dL (ref 6–23)
CO2: 30 mEq/L (ref 19–32)
Calcium: 8.4 mg/dL (ref 8.4–10.5)
Chloride: 105 mEq/L (ref 96–112)
Creatinine, Ser: 1.05 mg/dL (ref 0.40–1.50)
GFR: 67.69 mL/min (ref >60.00)
Glucose, Bld: 112 mg/dL — ABNORMAL HIGH (ref 70–99)
Potassium: 4 mEq/L (ref 3.5–5.1)
Sodium: 142 mEq/L (ref 135–145)

## 2019-09-17 LAB — MAGNESIUM: Magnesium: 2.2 mg/dL (ref 1.5–2.5)

## 2019-09-17 LAB — TSH: TSH: 2.94 u[IU]/mL (ref 0.35–4.50)

## 2019-09-17 LAB — VITAMIN B12: Vitamin B-12: 326 pg/mL (ref 211–911)

## 2019-09-17 LAB — T4, FREE: Free T4: 1.46 ng/dL (ref 0.60–1.60)

## 2019-09-17 MED ORDER — MECLIZINE HCL 12.5 MG PO TABS: 12.5000 mg | ORAL_TABLET | Freq: Three times a day (TID) | ORAL | 0 refills | Status: DC | PRN

## 2019-09-17 NOTE — Progress Notes (Signed)
Victor [name] is a 83 y.o. male  Chief Complaint  Patient presents with  . Dizziness    pt is feeling lightheaded and dizzy started monday.    HPI: Victor [name] is a 83 y.o. male complains of episodes of lightheadedness that started 3 days ago. These episodes lasts minutes to hours. Yesterday pt states he has symptoms x hours and at times true vertigo. No associated symptoms. Pt does not think symptoms are associated with position. Pt denies medication changes, dietary changes. He is not more active than normal.   Pt denies fever, chills, myalgias, headache, CP, new or worsening SOB, LE edema, GI symptoms, URI symptoms. No weakness, slurred speech, vision changes. No falls.   Pt notes same/similar symptoms > 10 years ago. Saw ENT, had labs, EKG, CT head, and ? MRI  - all negative/normal. Pt states he feels better today compared to the past 3 days.  Past Medical History:  Diagnosis Date  . Arthritis   . Atherosclerosis   . Atherosclerotic PVD with intermittent claudication (HCC)    stent left leg  . BPH (benign prostatic hyperplasia)   . Bulging lumbar disc   . Cancer ([place]) 11/12/14   vocal cord  carcinoma in situ , radiation; thyroid cancer  . Carotid bruit   . Chronic kidney disease    chronic stage III  . COPD (chronic obstructive pulmonary disease) ([place])   . DDD (degenerative disc disease), lumbar   . Dysphonia   . Dysplasia of true vocal cord   . GERD (gastroesophageal reflux disease)   . H/O carotid atherosclerosis    b/l  . Hyperlipidemia   . Hypothyroidism   . Occasional tremors    left hand managed with propranolol  . Peripheral vascular angioplasty status with implants and grafts   . Precancerous lesion 03/05/2018   premelanoma removed from back.   . Radiation 03/10/15- 04/18/16   Larynx  . Sleep apnea    hasn't used CPAP in years  . Spondylosis of lumbosacral region   . Thyroid disease     Past Surgical History:  Procedure Laterality Date  .  APPENDECTOMY    . COLONOSCOPY W/ POLYPECTOMY    . DIODE LASER APPLICATION Left 10/93/2355   Procedure: DIODE LASER APPLICATION;  Surgeon: Hayden Pedro, MD;  Location: [facility];  Service: Ophthalmology;  Laterality: Left;  . MEMBRANE PEEL Left 08/18/2019   Procedure: MEMBRANE PEEL;  Surgeon: Hayden Pedro, MD;  Location: [facility];  Service: Ophthalmology;  Laterality: Left;  . MOUTH SURGERY     tooth extraction  . PARS PLANA VITRECTOMY Left 08/18/2019   PARS PLANA VITRECTOMY 27 GAUGE (Left)  . PARS PLANA VITRECTOMY 27 GAUGE Left 08/18/2019   Procedure: PARS PLANA VITRECTOMY 27 GAUGE;  Surgeon: Hayden Pedro, MD;  Location: [facility];  Service: Ophthalmology;  Laterality: Left;  . PHOTOCOAGULATION WITH LASER Left 08/18/2019   Procedure: PHOTOCOAGULATION WITH LASER;  Surgeon: Hayden Pedro, MD;  Location: [facility];  Service: Ophthalmology;  Laterality: Left;  . THYROID SURGERY    . TONSILLECTOMY    . vocal cord biopsy  11/12/14   squamous cell carcinoma in situ    Social History   Socioeconomic History  . Marital status: Married    Spouse name: Not on file  . Number of children: Not on file  . Years of education: Not on file  . Highest education level: Not on file  Occupational History  . Not on file  Social Needs  . Financial resource strain: Not on file  . Food insecurity    Worry: Not on file    Inability: Not on file  . Transportation needs    Medical: Not on file    Non-medical: Not on file  Tobacco Use  . Smoking status: Former Smoker    Packs/day: 2.00    Years: 51.00    Pack years: 102.00    Types: Cigarettes    Start date: 39    Quit date: 03/05/2004    Years since quitting: 15.5  . Smokeless tobacco: Never Used  Substance and Sexual Activity  . Alcohol use: Yes    Alcohol/week: 7.0 standard drinks    Types: 7 Standard drinks or equivalent per week  . Drug use: No  . Sexual activity: Not on file  Lifestyle  . Physical activity    Days per week: Not on file     Minutes per session: Not on file  . Stress: Not on file  Relationships  . Social Herbalist on phone: Not on file    Gets together: Not on file    Attends religious service: Not on file    Active member of club or organization: Not on file    Attends meetings of clubs or organizations: Not on file    Relationship status: Not on file  . Intimate partner violence    Fear of current or ex partner: Not on file    Emotionally abused: Not on file    Physically abused: Not on file    Forced sexual activity: Not on file  Other Topics Concern  . Not on file  Social History Narrative  . Not on file    History reviewed. No pertinent family history.   Immunization History  Administered Date(s) Administered  . Influenza, High Dose Seasonal PF 08/14/2018  . Pneumococcal Conjugate-13 09/20/2014  . Pneumococcal Polysaccharide-23 08/05/2013  . Zoster 07/06/2013  . Zoster Recombinat (Shingrix) 09/16/2017    Outpatient Encounter Medications as of 09/17/2019  Medication Sig Note  . albuterol (PROAIR HFA) 108 (90 Base) MCG/ACT inhaler Inhale 1-2 puffs into the lungs every 6 (six) hours as needed for wheezing or shortness of breath.   Marland Kitchen aspirin 81 MG tablet Take 81 mg by mouth daily. 08/13/2019: If remember  . bacitracin-polymyxin b (POLYSPORIN) ophthalmic ointment Place 1 application into the left eye 3 (three) times daily. apply to eye every 12 hours while awake   . carboxymethylcellulose (REFRESH PLUS) 0.5 % SOLN Place 2 drops into both eyes 4 (four) times daily.   . Cholecalciferol (VITAMIN D3) 1000 units CAPS Take 1,000 Units by mouth daily.  08/13/2019: When remembers  . doxazosin (CARDURA) 2 MG tablet Take 1 tablet (2 mg total) by mouth at bedtime.   . famotidine (PEPCID) 20 MG tablet Take 20 mg by mouth at bedtime.   . fluticasone (FLONASE) 50 MCG/ACT nasal spray Place 1 spray into both nostrils 2 (two) times daily. Aller-flo   . gatifloxacin (ZYMAXID) 0.5 % SOLN Place 1 drop  into the left eye 4 (four) times daily.   Marland Kitchen HYDROcodone-acetaminophen (NORCO/VICODIN) 5-325 MG tablet Take 1 tablet by mouth every 6 (six) hours as needed for moderate pain.   Marland Kitchen levothyroxine (SYNTHROID) 150 MCG tablet TAKE ONE TABLET BY MOUTH ONE TIME DAILY  BEFORE BREAKFAST (Patient taking differently: Take 150 mcg by mouth daily before breakfast. )   . Loratadine (CLARITIN PO) Take 10 mg by mouth at bedtime.  Allertec   . Melatonin 10 MG TABS Take by mouth at bedtime.   . NON FORMULARY Take 3 drops by mouth daily. HEMP OIL   . omeprazole (PRILOSEC) 20 MG capsule Take 20 mg by mouth daily.   . prednisoLONE acetate (PRED FORTE) 1 % ophthalmic suspension Place 1 drop into the left eye 4 (four) times daily.   . propranolol ER (INDERAL LA) 80 MG 24 hr capsule Take 1 capsule (80 mg total) by mouth daily.   . simvastatin (ZOCOR) 40 MG tablet Take 1 tablet (40 mg total) by mouth daily. (Patient taking differently: Take 40 mg by mouth at bedtime. )   . traMADol (ULTRAM) 50 MG tablet Take 1 tablet (50 mg total) by mouth every 12 (twelve) hours as needed for severe pain.    No facility-administered encounter medications on file as of 09/17/2019.      ROS: Pertinent positives and negatives noted in HPI. Remainder of ROS non-contributory   No Known Allergies  BP 122/60   Pulse 72   Temp 98.3 F (36.8 C) (Temporal)   Ht 5\' 10"  (1.778 m)   Wt 216 lb (98 kg)   SpO2 95%   BMI 30.99 kg/m  BP Readings from Last 3 Encounters:  09/17/19 122/60  08/19/19 (!) 133/58  07/02/19 (!) 138/56   Pulse Readings from Last 3 Encounters:  09/17/19 72  08/19/19 67  07/02/19 65    Physical Exam  Constitutional: He is oriented to person, place, and time. He appears well-developed and well-nourished. No distress.  Eyes: Pupils are equal, round, and reactive to light. EOM are normal.  Neck: Neck supple. No JVD present.  Cardiovascular: Normal rate, regular rhythm, normal heart sounds and intact distal  pulses.  Pulmonary/Chest: Effort normal and breath sounds normal.  Musculoskeletal:        General: No edema.  Lymphadenopathy:    He has no cervical adenopathy.  Neurological: He is alert and oriented to person, place, and time. He has normal reflexes. No cranial nerve deficit. Coordination normal.  Skin: Skin is warm and dry.  Psychiatric: He has a normal mood and affect. His behavior is normal.    EKG - NSR at 66bpm, no acute ST changes  A/P:  1. Episodic lightheadedness - symptoms x 3 days, improved today - Basic metabolic panel - CBC - Magnesium - EKG 12-Lead Rx: - meclizine (ANTIVERT) 12.5 MG tablet; Take 1 tablet (12.5 mg total) by mouth 3 (three) times daily as needed for dizziness.  Dispense: 60 tablet; Refill: 0 - increase water intake - f/u if symptoms worsen or do not improve in 1wk  2. Low serum vitamin B12 - not taking B12 supplement - Vitamin B12  3. Postoperative hypothyroidism - TSH - T4, free

## 2019-10-01 DIAGNOSIS — Z87891 Personal history of nicotine dependence: Secondary | ICD-10-CM | POA: Insufficient documentation

## 2019-10-01 DIAGNOSIS — R0602 Shortness of breath: Secondary | ICD-10-CM | POA: Insufficient documentation

## 2019-10-01 DIAGNOSIS — Z0000 Encounter for general adult medical examination without abnormal findings: Secondary | ICD-10-CM | POA: Insufficient documentation

## 2019-10-01 NOTE — Progress Notes (Signed)
Virtual Visit via Telephone Note  I connected with Victor [name] on 10/02/19 at 10:00 AM EST by telephone and verified that I am speaking with the correct person using two identifiers.  Location: Patient: Home Provider: [facility] - [address], [place], [place], [place] [address]   I discussed the limitations, risks, security and privacy concerns of performing an evaluation and management service by telephone and the availability of in person appointments. I also discussed with the patient that there may be a patient responsible charge related to this service. The patient expressed understanding and agreed to proceed.  Patient consented to consult via telephone: Yes People present and their role in pt care: Pt    History of Present Illness:  83 year old male former smoker initially consulted with our practice on 07/02/2019 for evaluation of dyspnea  Past medical history: Dysphonia, BPH, history of thyroid cancer, history of glottic cancer, GERD, chronic kidney disease, PAD Smoking history: Former smoker.  Quit 2005.  102-pack-year smoking history Maintenance: None Patient of Dr. Elsworth Soho  Chief complaint: 32-month follow-up  83 year old male former smoker completing a telephonic follow-up with our office today.  Patient was last seen in our office in August/2020.  At that time patient was presenting today for evaluation of his episodic dyspnea.  His first episode was in February/2020 walking up a hill and he had to stop to catch his breath.  He typically was walking 2 miles every day with his wife.  He does have a significant smoking history of 102-pack-year smoking history.  Quit in 2005.  He was given Pulmicort by his primary care in the past he did not feel that this helped.  He had a negative treadmill stress test a few years ago with Dr. Einar Gip.  Echocardiogram was pending and that has been completed.  Those results are listed below:  07/17/2019-echocardiogram-left  ventricle normal size, EF 40%, grade 1 diastolic dysfunction, grade 2 mitral regurgitation, inadequate TR jet to estimate pulmonary arterial systolic pressure, normal right atrial pressure  Patient does have a history of laryngeal cancer and underwent radiation therapy and excision of his left anterior vocal cord lesion by ENT in 2016.  He did require transient G-tube for about a month due to dysphagia.  He has some hoarseness of his voice but denies any residual dysphagia or coughing.  In addition to his smoking history did also work in [employer] for 20 years.  He did not desaturate on 3 laps in the office in August/2020.  Chest x-ray does reveal emphysema.   Patient reporting today that he does continue to walk a mile a day with his spouse.  He reports that this is not because he is physically unable to go longer its because of the weather and they typically only walk about a mile a day now.  He does have an albuterol inhaler which he occasionally uses he does not feel this helps much.  He continues to cough daily with clear to yellow mucus.  No wheezing.  He has no history of recent flares requiring antibiotics or steroids.  Unfortunately patient has not been scheduled for pulmonary function testing yet.  We will work on this today.   Observations/Objective:  Social History   Tobacco Use  Smoking Status Former Smoker  . Packs/day: 2.00  . Years: 51.00  . Pack years: 102.00  . Types: Cigarettes  . Start date: 77  . Quit date: 03/05/2004  . Years since quitting: 15.5  Smokeless  Tobacco Never Used   Immunization History  Administered Date(s) Administered  . Influenza, High Dose Seasonal PF 07/06/2013, 08/14/2018, 08/10/2019  . Influenza,inj,Quad PF,6+ Mos 08/10/2015, 07/03/2016  . Influenza,inj,quad, With Preservative 08/09/2014  . Influenza-Unspecified 08/09/2014, 08/10/2015, 07/03/2016, 08/12/2017  . Pneumococcal Conjugate-13 09/20/2014  . Pneumococcal Polysaccharide-23  08/05/2013  . Td 10/11/2017  . Zoster 07/06/2013  . Zoster Recombinat (Shingrix) 09/16/2017    07/17/2019-echocardiogram-left ventricle normal size, EF 16%, grade 1 diastolic dysfunction, grade 2 mitral regurgitation, inadequate TR jet to estimate pulmonary arterial systolic pressure, normal right atrial pressure  07/02/2019-chest x-ray-COPD, emphysema  SIX MIN WALK 07/02/2019  Supplimental Oxygen during Test? (L/min) No  Tech Comments: patient walked at moderate pace, no complaints of dizziness or lightheadedness. tolerated well. no desat.    Assessment and Plan:  Centrilobular emphysema ([place]) Plan:  We will coordinate pulmonary function testing as soon as possible to further evaluate patient's dyspnea Continue rescue inhaler use as needed Continue to be as active as physically possible [employer] job already receiving your flu vaccine Follow-up in office with a breathing test ideally sometime over the next 4 to 8 weeks  Former smoker Plan: Continue to not smoke Up-to-date with flu vaccine and pneumonia vaccine Needs pulmonary function testing  Healthcare maintenance Plan: Up-to-date with flu and pneumonia vaccines Continue to be as physically active as possible  Shortness of breath Echocardiogram completed in September/2020 appears stable Cardiology was unable to evaluate pulmonary arterial pressure on echocardiogram Patient still needs pulmonary function testing 102-pack-year smoking history Chest x-rays from August/2020 favor emphysema Patient does produce mucus daily and coughs daily favoring chronic bronchitis/COPD  Plan: We will coordinate pulmonary function testing office as quickly as possible Will consider maintenance inhaler use after pulmonary function testing is achieved   Follow Up Instructions:  Return in about 2 months (around 12/02/2019), or if symptoms worsen or fail to improve, for Follow up with Dr. Elsworth Soho, Follow up with Wyn Quaker FNP-C, Follow up for  PFT.   I discussed the assessment and treatment plan with the patient. The patient was provided an opportunity to ask questions and all were answered. The patient agreed with the plan and demonstrated an understanding of the instructions.   The patient was advised to call back or seek an in-person evaluation if the symptoms worsen or if the condition fails to improve as anticipated.  I provided 23 minutes of non-face-to-face time during this encounter.   Lauraine Rinne, NP

## 2019-10-02 ENCOUNTER — Other Ambulatory Visit: Payer: Self-pay

## 2019-10-02 ENCOUNTER — Encounter: Payer: Self-pay | Admitting: Pulmonary Disease

## 2019-10-02 ENCOUNTER — Ambulatory Visit (INDEPENDENT_AMBULATORY_CARE_PROVIDER_SITE_OTHER): Payer: Medicare Other | Admitting: Pulmonary Disease

## 2019-10-02 ENCOUNTER — Telehealth: Payer: Self-pay | Admitting: Pulmonary Disease

## 2019-10-02 DIAGNOSIS — J432 Centrilobular emphysema: Secondary | ICD-10-CM

## 2019-10-02 DIAGNOSIS — Z87891 Personal history of nicotine dependence: Secondary | ICD-10-CM | POA: Diagnosis not present

## 2019-10-02 DIAGNOSIS — Z0000 Encounter for general adult medical examination without abnormal findings: Secondary | ICD-10-CM

## 2019-10-02 DIAGNOSIS — R0602 Shortness of breath: Secondary | ICD-10-CM | POA: Diagnosis not present

## 2019-10-02 NOTE — Telephone Encounter (Signed)
10/02/2019 1010  PCC's,  Can we contact the patient and get him scheduled for pulmonary function test in our office as soon as possible.  This was ordered in August/2020.  Unfortunately this was not coordinated.  Patient needs follow-up with Dr. Elsworth Soho or myself after completing pulmonary function testing.  Patient is aware he will need to obtain Covid screening prior to pulmonary function test.  Wyn Quaker, FNP

## 2019-10-02 NOTE — Assessment & Plan Note (Signed)
Plan:  We will coordinate pulmonary function testing as soon as possible to further evaluate patient's dyspnea Continue rescue inhaler use as needed Continue to be as active as physically possible [employer] job already receiving your flu vaccine Follow-up in office with a breathing test ideally sometime over the next 4 to 8 weeks

## 2019-10-02 NOTE — Assessment & Plan Note (Signed)
Echocardiogram completed in September/2020 appears stable Cardiology was unable to evaluate pulmonary arterial pressure on echocardiogram Patient still needs pulmonary function testing 102-pack-year smoking history Chest x-rays from August/2020 favor emphysema Patient does produce mucus daily and coughs daily favoring chronic bronchitis/COPD  Plan: We will coordinate pulmonary function testing office as quickly as possible Will consider maintenance inhaler use after pulmonary function testing is achieved

## 2019-10-02 NOTE — Assessment & Plan Note (Signed)
Plan: Up-to-date with flu and pneumonia vaccines Continue to be as physically active as possible

## 2019-10-02 NOTE — Assessment & Plan Note (Signed)
Plan: Continue to not smoke Up-to-date with flu vaccine and pneumonia vaccine Needs pulmonary function testing

## 2019-10-02 NOTE — Patient Instructions (Addendum)
You were seen today by Lauraine Rinne, NP  for:   1. Centrilobular emphysema (HCC)  We will work to get you scheduled for pulmonary function testing  Note your daily symptoms > remember "red flags" for COPD:   >>>Increase in cough >>>increase in sputum production >>>increase in shortness of breath or activity  intolerance.   If you notice these symptoms, please call the office to be seen.   Great job already receiving your flu vaccine  Continue to walk as much as possible  2. Shortness of breath  We will get you scheduled for pulmonary function testing in our office  3. Former smoker  Continue to not smoke  4. Healthcare maintenance  Great job already receiving your flu vaccine this year Continue to remain as active as possible   Follow Up:     Return in about 2 months (around 12/02/2019), or if symptoms worsen or fail to improve, for Follow up with Dr. Elsworth Soho, Follow up with Wyn Quaker FNP-C, Follow up for PFT.   Please do your part to reduce the spread of COVID-19:      Reduce your risk of any infection  and COVID19 by using the similar precautions used for avoiding the common cold or flu:  Marland Kitchen Wash your hands often with soap and warm water for at least 20 seconds.  If soap and water are not readily available, use an alcohol-based hand sanitizer with at least 60% alcohol.  . If coughing or sneezing, cover your mouth and nose by coughing or sneezing into the elbow areas of your shirt or coat, into a tissue or into your sleeve (not your hands). Langley Gauss A MASK when in public  . Avoid shaking hands with others and consider head nods or verbal greetings only. . Avoid touching your eyes, nose, or mouth with unwashed hands.  . Avoid close contact with people who are sick. . Avoid places or events with large numbers of people in one location, like concerts or sporting events. . If you have some symptoms but not all symptoms, continue to monitor at home and seek medical attention  if your symptoms worsen. . If you are having a medical emergency, call 911.   [place] / e-Visit: [email or web address]         [facility]: [facility]: [phone]                   [facility]: [phone]     It is flu season:   >>> Best ways to protect herself from the flu: Receive the yearly flu vaccine, practice good hand hygiene washing with soap and also using hand sanitizer when available, eat a nutritious meals, get adequate rest, hydrate appropriately   Please contact the office if your symptoms worsen or you have concerns that you are not improving.   Thank you for choosing [facility] for your healthcare, and for allowing [place] to partner with you on your healthcare journey. I am thankful to be able to provide care to you today.   Wyn Quaker FNP-C     COPD and Physical Activity Chronic obstructive pulmonary disease (COPD) is a long-term (chronic) condition that affects the lungs. COPD is a general term that can be used to describe many different lung problems that cause lung swelling (inflammation) and limit airflow, including chronic bronchitis and emphysema. The main symptom of COPD is shortness of  breath, which makes it harder to do even simple tasks. This can also make it harder to exercise and be active. Talk with your health care provider about treatments to help you breathe better and actions you can take to prevent breathing problems during physical activity. What are the benefits of exercising with COPD? Exercising regularly is an important part of a healthy lifestyle. You can still exercise and do physical activities even though you have COPD. Exercise and physical activity improve your shortness of breath by increasing blood flow (circulation). This causes your heart to pump more oxygen through your  body. Moderate exercise can improve your:  Oxygen use.  Energy level.  Shortness of breath.  Strength in your breathing muscles.  Heart health.  Sleep.  Self-esteem and feelings of self-worth.  Depression, stress, and anxiety levels. Exercise can benefit everyone with COPD. The severity of your disease may affect how hard you can exercise, especially at first, but everyone can benefit. Talk with your health care provider about how much exercise is safe for you, and which activities and exercises are safe for you. What actions can I take to prevent breathing problems during physical activity?  Sign up for a pulmonary rehabilitation program. This type of program may include: ? Education about lung diseases. ? Exercise classes that teach you how to exercise and be more active while improving your breathing. This usually involves:  Exercise using your lower extremities, such as a stationary bicycle.  About 30 minutes of exercise, 2 to 5 times per week, for 6 to 12 weeks  Strength training, such as push ups or leg lifts. ? Nutrition education. ? Group classes in which you can talk with others who also have COPD and learn ways to manage stress.  If you use an oxygen tank, you should use it while you exercise. Work with your health care provider to adjust your oxygen for your physical activity. Your resting flow rate is different from your flow rate during physical activity.  While you are exercising: ? Take slow breaths. ? Pace yourself and do not try to go too fast. ? Purse your lips while breathing out. Pursing your lips is similar to a kissing or whistling position. ? If doing exercise that uses a quick burst of effort, such as weight lifting:  Breathe in before starting the exercise.  Breathe out during the hardest part of the exercise (such as raising the weights). Where to find support You can find support for exercising with COPD from:  Your health care provider.  A  pulmonary rehabilitation program.  Your local health department or community health programs.  Support groups, online or in-person. Your health care provider may be able to recommend support groups. Where to find more information You can find more information about exercising with COPD from:  American Lung Association: ClassInsider.se.  COPD Foundation: https://www.rivera.net/. Contact a health care provider if:  Your symptoms get worse.  You have chest pain.  You have nausea.  You have a fever.  You have trouble talking or catching your breath.  You want to start a new exercise program or a new activity. Summary  COPD is a general term that can be used to describe many different lung problems that cause lung swelling (inflammation) and limit airflow. This includes chronic bronchitis and emphysema.  Exercise and physical activity improve your shortness of breath by increasing blood flow (circulation). This causes your heart to provide more oxygen to your body.  Contact your health care  provider before starting any exercise program or new activity. Ask your health care provider what exercises and activities are safe for you. This information is not intended to replace advice given to you by your health care provider. Make sure you discuss any questions you have with your health care provider. Document Released: 11/14/2017 Document Revised: 02/11/2019 Document Reviewed: 11/14/2017 Elsevier Patient Education  2020 [employer].

## 2019-10-09 ENCOUNTER — Other Ambulatory Visit (HOSPITAL_COMMUNITY)
Admission: RE | Admit: 2019-10-09 | Discharge: 2019-10-09 | Disposition: A | Discharge status: Home / Self Care | Payer: Medicare Other | Source: Ambulatory Visit | Attending: Pulmonary Disease | Admitting: Pulmonary Disease

## 2019-10-09 DIAGNOSIS — Z01812 Encounter for preprocedural laboratory examination: Secondary | ICD-10-CM | POA: Diagnosis present

## 2019-10-09 DIAGNOSIS — Z20828 Contact with and (suspected) exposure to other viral communicable diseases: Secondary | ICD-10-CM | POA: Diagnosis not present

## 2019-10-10 LAB — NOVEL CORONAVIRUS, NAA (HOSP ORDER, SEND-OUT TO REF LAB; TAT 18-24 HRS): SARS-CoV-2, NAA: NOT DETECTED

## 2019-10-11 NOTE — Progress Notes (Signed)
SARS-CoV-2 test is negative.  This is good news.  Proceed forward with work-up as planned.  Lucetta Baehr, FNP 

## 2019-10-12 ENCOUNTER — Other Ambulatory Visit: Payer: Self-pay | Admitting: Radiation Oncology

## 2019-10-13 ENCOUNTER — Ambulatory Visit (INDEPENDENT_AMBULATORY_CARE_PROVIDER_SITE_OTHER): Payer: Medicare Other | Admitting: Pulmonary Disease

## 2019-10-13 DIAGNOSIS — J432 Centrilobular emphysema: Secondary | ICD-10-CM

## 2019-10-13 LAB — PULMONARY FUNCTION TEST
DL/VA % pred: 65 %
DL/VA: 2.54 ml/min/mmHg/L
DLCO unc % pred: 58 %
DLCO unc: 14.3 ml/min/mmHg
FEF 25-75 Post: 0.89 L/sec
FEF 25-75 Pre: 0.62 L/sec
FEF2575-%Change-Post: 42 %
FEF2575-%Pred-Post: 45 %
FEF2575-%Pred-Pre: 31 %
FEV1-%Change-Post: 12 %
FEV1-%Pred-Post: 56 %
FEV1-%Pred-Pre: 50 %
FEV1-Post: 1.64 L
FEV1-Pre: 1.45 L
FEV1FVC-%Change-Post: 4 %
FEV1FVC-%Pred-Pre: 74 %
FEV6-%Change-Post: 10 %
FEV6-%Pred-Post: 75 %
FEV6-%Pred-Pre: 67 %
FEV6-Post: 2.86 L
FEV6-Pre: 2.58 L
FEV6FVC-%Change-Post: 2 %
FEV6FVC-%Pred-Post: 103 %
FEV6FVC-%Pred-Pre: 100 %
FVC-%Change-Post: 7 %
FVC-%Pred-Post: 72 %
FVC-%Pred-Pre: 67 %
FVC-Post: 2.96 L
FVC-Pre: 2.75 L
Post FEV1/FVC ratio: 55 %
Post FEV6/FVC ratio: 97 %
Pre FEV1/FVC ratio: 53 %
Pre FEV6/FVC Ratio: 94 %
RV % pred: 139 %
RV: 3.78 L
TLC % pred: 93 %
TLC: 6.72 L

## 2019-10-13 NOTE — Progress Notes (Signed)
PFT done today. 

## 2019-10-16 ENCOUNTER — Ambulatory Visit: Payer: Medicare Other | Admitting: Radiation Oncology

## 2019-10-29 ENCOUNTER — Other Ambulatory Visit: Payer: Self-pay | Admitting: Family Medicine

## 2019-10-29 DIAGNOSIS — M5136 Other intervertebral disc degeneration, lumbar region: Secondary | ICD-10-CM

## 2019-10-29 DIAGNOSIS — M5416 Radiculopathy, lumbar region: Secondary | ICD-10-CM

## 2019-10-29 DIAGNOSIS — M47816 Spondylosis without myelopathy or radiculopathy, lumbar region: Secondary | ICD-10-CM

## 2019-11-03 ENCOUNTER — Other Ambulatory Visit: Payer: Self-pay | Admitting: Family Medicine

## 2019-11-03 DIAGNOSIS — M47816 Spondylosis without myelopathy or radiculopathy, lumbar region: Secondary | ICD-10-CM

## 2019-11-03 DIAGNOSIS — M5136 Other intervertebral disc degeneration, lumbar region: Secondary | ICD-10-CM

## 2019-11-03 DIAGNOSIS — M5416 Radiculopathy, lumbar region: Secondary | ICD-10-CM

## 2019-11-03 MED ORDER — HYDROCODONE-ACETAMINOPHEN 5-325 MG PO TABS: 1.0000 | ORAL_TABLET | Freq: Four times a day (QID) | ORAL | 0 refills | Status: DC | PRN

## 2019-11-25 ENCOUNTER — Encounter (INDEPENDENT_AMBULATORY_CARE_PROVIDER_SITE_OTHER): Payer: Medicare Other | Admitting: Ophthalmology

## 2019-11-25 DIAGNOSIS — H43813 Vitreous degeneration, bilateral: Secondary | ICD-10-CM

## 2019-11-25 DIAGNOSIS — H353132 Nonexudative age-related macular degeneration, bilateral, intermediate dry stage: Secondary | ICD-10-CM

## 2019-11-25 DIAGNOSIS — H35372 Puckering of macula, left eye: Secondary | ICD-10-CM | POA: Diagnosis not present

## 2019-12-13 ENCOUNTER — Ambulatory Visit: Payer: Medicare Other | Attending: Internal Medicine

## 2019-12-13 DIAGNOSIS — Z23 Encounter for immunization: Secondary | ICD-10-CM

## 2019-12-13 NOTE — Progress Notes (Signed)
   Covid-19 Vaccination Clinic  Name:  Victor [name]    MRN: [id number] DOB: April 12, 1936  12/13/2019  Mr. Azeez was observed post Covid-19 immunization for 15 minutes without incidence. He was provided with Vaccine Information Sheet and instruction to access the V-Safe system.   Mr. Venuto was instructed to call 911 with any severe reactions post vaccine: Marland Kitchen Difficulty breathing  . Swelling of your face and throat  . A fast heartbeat  . A bad rash all over your body  . Dizziness and weakness    Immunizations Administered    Name Date Dose VIS Date Route   Pfizer COVID-19 Vaccine 12/13/2019  5:50 PM 0.3 mL 10/16/2019 Intramuscular   Manufacturer: [place]   Lot: [id number]   [place]: [id number]

## 2019-12-31 ENCOUNTER — Ambulatory Visit: Payer: Medicare Other

## 2020-01-07 ENCOUNTER — Ambulatory Visit: Payer: Medicare Other | Attending: Internal Medicine

## 2020-01-07 DIAGNOSIS — Z23 Encounter for immunization: Secondary | ICD-10-CM | POA: Insufficient documentation

## 2020-01-07 NOTE — Progress Notes (Signed)
   Covid-19 Vaccination Clinic  Name:  Victor [name]    MRN: [id number] DOB: 01/16/1936  01/07/2020  [name] was observed post Covid-19 immunization for 15 minutes without incident. He was provided with Vaccine Information Sheet and instruction to access the V-Safe system.   [name] was instructed to call 911 with any severe reactions post vaccine: Marland Kitchen Difficulty breathing  . Swelling of face and throat  . A fast heartbeat  . A bad rash all over body  . Dizziness and weakness   Immunizations Administered    Name Date Dose VIS Date Route   Pfizer COVID-19 Vaccine 01/07/2020  3:35 PM 0.3 mL 10/16/2019 Intramuscular   Manufacturer: [place]   Lot: [id number]   [place]: [id number]

## 2020-02-04 ENCOUNTER — Other Ambulatory Visit: Payer: Self-pay | Admitting: Family Medicine

## 2020-02-25 ENCOUNTER — Other Ambulatory Visit: Payer: Self-pay

## 2020-02-26 ENCOUNTER — Ambulatory Visit (INDEPENDENT_AMBULATORY_CARE_PROVIDER_SITE_OTHER): Payer: Medicare Other | Admitting: Family Medicine

## 2020-02-26 ENCOUNTER — Encounter: Payer: Self-pay | Admitting: Family Medicine

## 2020-02-26 VITALS — BP 118/58 | HR 69 | Temp 96.5°F | Ht 70.0 in | Wt 224.6 lb

## 2020-02-26 DIAGNOSIS — M25552 Pain in left hip: Secondary | ICD-10-CM

## 2020-02-26 DIAGNOSIS — K219 Gastro-esophageal reflux disease without esophagitis: Secondary | ICD-10-CM

## 2020-02-26 DIAGNOSIS — M25551 Pain in right hip: Secondary | ICD-10-CM

## 2020-02-26 DIAGNOSIS — G8929 Other chronic pain: Secondary | ICD-10-CM

## 2020-02-26 DIAGNOSIS — N401 Enlarged prostate with lower urinary tract symptoms: Secondary | ICD-10-CM

## 2020-02-26 DIAGNOSIS — E785 Hyperlipidemia, unspecified: Secondary | ICD-10-CM | POA: Diagnosis not present

## 2020-02-26 DIAGNOSIS — N138 Other obstructive and reflux uropathy: Secondary | ICD-10-CM

## 2020-02-26 DIAGNOSIS — M5136 Other intervertebral disc degeneration, lumbar region: Secondary | ICD-10-CM

## 2020-02-26 DIAGNOSIS — E890 Postprocedural hypothyroidism: Secondary | ICD-10-CM

## 2020-02-26 MED ORDER — SIMVASTATIN 40 MG PO TABS: 40.0000 mg | ORAL_TABLET | Freq: Every day | ORAL | 3 refills | Status: DC

## 2020-02-26 MED ORDER — DOXAZOSIN MESYLATE 2 MG PO TABS: 2.0000 mg | ORAL_TABLET | Freq: Every day | ORAL | 3 refills | Status: DC

## 2020-02-26 MED ORDER — LEVOTHYROXINE SODIUM 150 MCG PO TABS: ORAL_TABLET | ORAL | 3 refills | Status: DC

## 2020-02-26 MED ORDER — OMEPRAZOLE 20 MG PO CPDR: 20.0000 mg | DELAYED_RELEASE_CAPSULE | Freq: Two times a day (BID) | ORAL | 3 refills | Status: DC

## 2020-02-26 MED ORDER — PROPRANOLOL HCL ER 80 MG PO CP24: 80.0000 mg | ORAL_CAPSULE | Freq: Every day | ORAL | 3 refills | Status: DC

## 2020-02-26 NOTE — Patient Instructions (Addendum)
Take generic zyrtec 1 tab daily qPM Use flonase 2 sprays each nostril daily Use nasal saline spray 3-4x/day  When you go for MRIs, ask for disc with images to take to ortho/spine/sports med appt

## 2020-02-26 NOTE — Progress Notes (Signed)
Victor [name] is a 84 y.o. male  Chief Complaint  Patient presents with  . Pain    lower back and hip pain-gotten worse in last week//OTC tyelonel-hardly helps    HPI: Victor [name] is a 84 y.o. male who complains of low back and B/L Lt>Rt hip pain. These are chronic issues for the patient, but he states they have worsened/flared recently. He is not able to lay on his Lt side. No numbness or tingling in LE, no weakness, no swelling. Standing is worse than sitting, even if it is for more than 5 min. Walking tends to be ok. Pain wakes him up from sleep at times and then he has trouble getting back to sleep.  Pt states tylenol provides minimal relief. He has taken tramadol 50mg  PRN in the past for severe hip and back pain, last Rx in 10/2019. Tramadol does not make pt sleepy. He thinks norco works better.   Pt has a h/o lumbar DDD, disc bulge, as well as lumbar radiculopathy and lumbosacral spondylosis.  Pt had B/L hip xrays in 12/2018 - Rt > Lt mild hip joint degeneration MRI of lumbar spine  > 5 years ago, and never had MRI hip.  He was seen in the past by Dr. Lynden Oxford (PM&R with [phone]) for the above issues but has not scheduled f/u appt with him.   Pt also frustrated with med refills and wants to ensure 90 day supply and 3RF of all maintenance meds.    Past Medical History:  Diagnosis Date  . Arthritis   . Atherosclerosis   . Atherosclerotic PVD with intermittent claudication (HCC)    stent left leg  . BPH (benign prostatic hyperplasia)   . Bulging lumbar disc   . Cancer ([place]) 11/12/14   vocal cord  carcinoma in situ , radiation; thyroid cancer  . Carotid bruit   . Chronic kidney disease    chronic stage III  . COPD (chronic obstructive pulmonary disease) ([place])   . DDD (degenerative disc disease), lumbar   . Dysphonia   . Dysplasia of true vocal cord   . GERD (gastroesophageal reflux disease)   . H/O carotid atherosclerosis    b/l  . Hyperlipidemia   . Hypothyroidism   .  Occasional tremors    left hand managed with propranolol  . Peripheral vascular angioplasty status with implants and grafts   . Precancerous lesion 03/05/2018   premelanoma removed from back.   . Radiation 03/10/15- 04/18/16   Larynx  . Sleep apnea    hasn't used CPAP in years  . Spondylosis of lumbosacral region   . Thyroid disease     Past Surgical History:  Procedure Laterality Date  . APPENDECTOMY    . COLONOSCOPY W/ POLYPECTOMY    . DIODE LASER APPLICATION Left 65/68/1275   Procedure: DIODE LASER APPLICATION;  Surgeon: Hayden Pedro, MD;  Location: [place];  Service: Ophthalmology;  Laterality: Left;  . MEMBRANE PEEL Left 08/18/2019   Procedure: MEMBRANE PEEL;  Surgeon: Hayden Pedro, MD;  Location: [place];  Service: Ophthalmology;  Laterality: Left;  . MOUTH SURGERY     tooth extraction  . PARS PLANA VITRECTOMY Left 08/18/2019   PARS PLANA VITRECTOMY 27 GAUGE (Left)  . PARS PLANA VITRECTOMY 27 GAUGE Left 08/18/2019   Procedure: PARS PLANA VITRECTOMY 27 GAUGE;  Surgeon: Hayden Pedro, MD;  Location: [place];  Service: Ophthalmology;  Laterality: Left;  . PHOTOCOAGULATION WITH LASER Left 08/18/2019  Procedure: PHOTOCOAGULATION WITH LASER;  Surgeon: Hayden Pedro, MD;  Location: [place];  Service: Ophthalmology;  Laterality: Left;  . THYROID SURGERY    . TONSILLECTOMY    . vocal cord biopsy  11/12/14   squamous cell carcinoma in situ    Social History   Socioeconomic History  . Marital status: Married    Spouse name: Not on file  . Number of children: Not on file  . Years of education: Not on file  . Highest education level: Not on file  Occupational History  . Not on file  Tobacco Use  . Smoking status: Former Smoker    Packs/day: 2.00    Years: 51.00    Pack years: 102.00    Types: Cigarettes    Start date: 49    Quit date: 03/05/2004    Years since quitting: 15.9  . Smokeless tobacco: Never Used  Substance and Sexual Activity  . Alcohol use: Yes     Alcohol/week: 7.0 standard drinks    Types: 7 Standard drinks or equivalent per week  . Drug use: No  . Sexual activity: Not on file  Other Topics Concern  . Not on file  Social History Narrative  . Not on file   Social Determinants of Health   Financial Resource Strain:   . Difficulty of Paying Living Expenses:   Food Insecurity:   . Worried About Charity fundraiser in the Last Year:   . Arboriculturist in the Last Year:   Transportation Needs:   . Film/video editor (Medical):   Marland Kitchen Lack of Transportation (Non-Medical):   Physical Activity:   . Days of Exercise per Week:   . Minutes of Exercise per Session:   Stress:   . Feeling of Stress :   Social Connections:   . Frequency of Communication with Friends and Family:   . Frequency of Social Gatherings with Friends and Family:   . Attends Religious Services:   . Active Member of Clubs or Organizations:   . Attends Archivist Meetings:   Marland Kitchen Marital Status:   Intimate Partner Violence:   . Fear of Current or Ex-Partner:   . Emotionally Abused:   Marland Kitchen Physically Abused:   . Sexually Abused:     History reviewed. No pertinent family history.   Immunization History  Administered Date(s) Administered  . Influenza, High Dose Seasonal PF 07/06/2013, 08/14/2018, 08/10/2019  . Influenza,inj,Quad PF,6+ Mos 08/10/2015, 07/03/2016  . Influenza,inj,quad, With Preservative 08/09/2014  . Influenza-Unspecified 08/09/2014, 08/10/2015, 07/03/2016, 08/12/2017  . PFIZER SARS-COV-2 Vaccination 12/13/2019, 01/07/2020  . Pneumococcal Conjugate-13 09/20/2014  . Pneumococcal Polysaccharide-23 08/05/2013  . Td 10/11/2017  . Zoster 07/06/2013  . Zoster Recombinat (Shingrix) 09/16/2017    Outpatient Encounter Medications as of 02/26/2020  Medication Sig Note  . aspirin 81 MG tablet Take 81 mg by mouth daily. 08/13/2019: If remember  . bacitracin-polymyxin b (POLYSPORIN) ophthalmic ointment Place 1 application into the left eye 3  (three) times daily. apply to eye every 12 hours while awake   . carboxymethylcellulose (REFRESH PLUS) 0.5 % SOLN Place 2 drops into both eyes 4 (four) times daily.   . Cholecalciferol (VITAMIN D3) 1000 units CAPS Take 1,000 Units by mouth daily.  08/13/2019: When remembers  . doxazosin (CARDURA) 2 MG tablet Take 1 tablet (2 mg total) by mouth at bedtime.   . fluticasone (FLONASE) 50 MCG/ACT nasal spray Place 1 spray into both nostrils 2 (two) times daily. Aller-flo   .  gatifloxacin (ZYMAXID) 0.5 % SOLN Place 1 drop into the left eye 4 (four) times daily.   Marland Kitchen HYDROcodone-acetaminophen (NORCO/VICODIN) 5-325 MG tablet Take 1 tablet by mouth every 6 (six) hours as needed for moderate pain.   Marland Kitchen levothyroxine (SYNTHROID) 150 MCG tablet TAKE ONE TABLET BY MOUTH ONE TIME DAILY  BEFORE BREAKFAST (Patient taking differently: Take 150 mcg by mouth daily before breakfast. )   . Loratadine (CLARITIN PO) Take 10 mg by mouth at bedtime. Allertec   . Melatonin 10 MG TABS Take by mouth at bedtime.   . NON FORMULARY Take 3 drops by mouth daily. HEMP OIL   . omeprazole (PRILOSEC) 20 MG capsule Take 20 mg by mouth daily.   . prednisoLONE acetate (PRED FORTE) 1 % ophthalmic suspension Place 1 drop into the left eye 4 (four) times daily.   . propranolol (INNOPRAN XL) 80 MG 24 hr capsule TAKE 1 CAPSULE BY MOUTH ONCE A DAY   . propranolol ER (INDERAL LA) 80 MG 24 hr capsule Take 1qd (Plz sched visit for future fills)   . simvastatin (ZOCOR) 40 MG tablet Take 1 tablet (40 mg total) by mouth daily. (Patient taking differently: Take 40 mg by mouth at bedtime. )   . traMADol (ULTRAM) 50 MG tablet TAKE ONE TABLET BY MOUTH EVERY TWELVE HOURS AS NEEDED FOR SEVERE PAIN   . albuterol (PROAIR HFA) 108 (90 Base) MCG/ACT inhaler Inhale 1-2 puffs into the lungs every 6 (six) hours as needed for wheezing or shortness of breath. (Patient not taking: Reported on 02/26/2020)   . famotidine (PEPCID) 20 MG tablet Take 20 mg by mouth at  bedtime.   . meclizine (ANTIVERT) 12.5 MG tablet Take 1 tablet (12.5 mg total) by mouth 3 (three) times daily as needed for dizziness. (Patient not taking: Reported on 02/26/2020)    No facility-administered encounter medications on file as of 02/26/2020.     ROS: Pertinent positives and negatives noted in HPI. Remainder of ROS non-contributory    No Known Allergies  BP (!) 118/58   Pulse 69   Temp (!) 96.5 F (35.8 C) (Tympanic)   Ht 5\' 10"  (1.778 m)   Wt 224 lb 9.6 oz (101.9 kg)   SpO2 93%   BMI 32.23 kg/m   Physical Exam  Constitutional: He is oriented to person, place, and time. He appears well-developed and well-nourished. No distress.  Pulmonary/Chest: No respiratory distress.  Musculoskeletal:        General: No edema.  Neurological: He is alert and oriented to person, place, and time. Coordination normal.  Psychiatric: He has a normal mood and affect. His behavior is normal.     A/P:  1. Other intervertebral disc degeneration, lumbar region - MR Lumbar Spine Wo Contrast; Future  - pt wil schedule f/u with PM&R Dr. [facility]) as he has seen provider previously for this issue  2. Chronic hip pain, left 3. Chronic right hip pain - MR HIP LEFT WO CONTRAST; Future - MR HIP RIGHT WO CONTRAST; Future - Ambulatory referral to Sports Medicine - pt requests referral to Dr. [name] whom pt has seen before for neck issues  4. Hyperlipidemia, unspecified hyperlipidemia type - overdue for FLP but pt is not fasting today Refill: - simvastatin (ZOCOR) 40 MG tablet; Take 1 tablet (40 mg total) by mouth at bedtime.  Dispense: 90 tablet; Refill: 3  5. Gastro-esophageal reflux disease without esophagitis - stable, controlled Refill: - omeprazole (PRILOSEC) 20 MG capsule; Take 1 capsule (20  mg total) by mouth 2 (two) times daily before a meal.  Dispense: 180 capsule; Refill: 3  6. Postoperative hypothyroidism - stable, last TFTs WNL 09/2019 Refill: - levothyroxine  (SYNTHROID) 150 MCG tablet; TAKE ONE TABLET BY MOUTH ONE TIME DAILY  BEFORE BREAKFAST  Dispense: 90 tablet; Refill: 3  7. Benign prostatic hyperplasia with urinary obstruction - stable Refill: - doxazosin (CARDURA) 2 MG tablet; Take 1 tablet (2 mg total) by mouth at bedtime.  Dispense: 90 tablet; Refill: 3    This visit occurred during the SARS-CoV-2 public health emergency.  Safety protocols were in place, including screening questions prior to the visit, additional usage of staff PPE, and extensive cleaning of exam room while observing appropriate contact time as indicated for disinfecting solutions.

## 2020-03-01 ENCOUNTER — Encounter: Payer: Self-pay | Admitting: Family Medicine

## 2020-03-01 ENCOUNTER — Ambulatory Visit: Payer: Self-pay

## 2020-03-01 ENCOUNTER — Other Ambulatory Visit: Payer: Self-pay

## 2020-03-01 ENCOUNTER — Ambulatory Visit (INDEPENDENT_AMBULATORY_CARE_PROVIDER_SITE_OTHER): Payer: Medicare Other | Admitting: Family Medicine

## 2020-03-01 VITALS — BP 136/70 | HR 69 | Ht 70.0 in | Wt 220.0 lb

## 2020-03-01 DIAGNOSIS — M533 Sacrococcygeal disorders, not elsewhere classified: Secondary | ICD-10-CM | POA: Insufficient documentation

## 2020-03-01 DIAGNOSIS — G8929 Other chronic pain: Secondary | ICD-10-CM

## 2020-03-01 MED ORDER — TRIAMCINOLONE ACETONIDE 40 MG/ML IJ SUSP
40.0000 mg | Freq: Once | INTRAMUSCULAR | Status: AC
  Administered 2020-03-01: 40 mg via INTRA_ARTICULAR

## 2020-03-01 NOTE — Progress Notes (Signed)
Victor [name] - 84 y.o. male MRN [id number]  Date of birth: Aug 15, 1936  SUBJECTIVE:  Including CC & ROS.  Chief Complaint  Patient presents with  . Hip Pain    left hip    Victor [name] is a 84 y.o. male that is presenting with acute on chronic left hip pain.  He reports 2 injections in the greater trochanteric area that did not improve any of his pain.  He has pain if he lies on the affected side.  He has the pain on the lateral aspect of the hip as well as posteriorly.  Has been dealing with this for about 15 years.  Denies any radicular symptoms..  Independent review of the left hip x-ray and AP pelvis from 12/18/2018 shows mild degenerative changes of the joint   Review of Systems See HPI   HISTORY: Past Medical, Surgical, Social, and Family History Reviewed & Updated per EMR.   Pertinent Historical Findings include:  Past Medical History:  Diagnosis Date  . Arthritis   . Atherosclerosis   . Atherosclerotic PVD with intermittent claudication (HCC)    stent left leg  . BPH (benign prostatic hyperplasia)   . Bulging lumbar disc   . Cancer ([place]) 11/12/14   vocal cord  carcinoma in situ , radiation; thyroid cancer  . Carotid bruit   . Chronic kidney disease    chronic stage III  . COPD (chronic obstructive pulmonary disease) ([place])   . DDD (degenerative disc disease), lumbar   . Dysphonia   . Dysplasia of true vocal cord   . GERD (gastroesophageal reflux disease)   . H/O carotid atherosclerosis    b/l  . Hyperlipidemia   . Hypothyroidism   . Occasional tremors    left hand managed with propranolol  . Peripheral vascular angioplasty status with implants and grafts   . Precancerous lesion 03/05/2018   premelanoma removed from back.   . Radiation 03/10/15- 04/18/16   Larynx  . Sleep apnea    hasn't used CPAP in years  . Spondylosis of lumbosacral region   . Thyroid disease     Past Surgical History:  Procedure Laterality Date  . APPENDECTOMY    . COLONOSCOPY W/  POLYPECTOMY    . DIODE LASER APPLICATION Left 76/72/0947   Procedure: DIODE LASER APPLICATION;  Surgeon: Hayden Pedro, MD;  Location: [place];  Service: Ophthalmology;  Laterality: Left;  . MEMBRANE PEEL Left 08/18/2019   Procedure: MEMBRANE PEEL;  Surgeon: Hayden Pedro, MD;  Location: [place];  Service: Ophthalmology;  Laterality: Left;  . MOUTH SURGERY     tooth extraction  . PARS PLANA VITRECTOMY Left 08/18/2019   PARS PLANA VITRECTOMY 27 GAUGE (Left)  . PARS PLANA VITRECTOMY 27 GAUGE Left 08/18/2019   Procedure: PARS PLANA VITRECTOMY 27 GAUGE;  Surgeon: Hayden Pedro, MD;  Location: [place];  Service: Ophthalmology;  Laterality: Left;  . PHOTOCOAGULATION WITH LASER Left 08/18/2019   Procedure: PHOTOCOAGULATION WITH LASER;  Surgeon: Hayden Pedro, MD;  Location: [place];  Service: Ophthalmology;  Laterality: Left;  . THYROID SURGERY    . TONSILLECTOMY    . vocal cord biopsy  11/12/14   squamous cell carcinoma in situ    No family history on file.  Social History   Socioeconomic History  . Marital status: Married    Spouse name: Not on file  . Number of children: Not on file  . Years of education: Not on file  . Highest education  level: Not on file  Occupational History  . Not on file  Tobacco Use  . Smoking status: Former Smoker    Packs/day: 2.00    Years: 51.00    Pack years: 102.00    Types: Cigarettes    Start date: 63    Quit date: 03/05/2004    Years since quitting: 16.0  . Smokeless tobacco: Never Used  Substance and Sexual Activity  . Alcohol use: Yes    Alcohol/week: 7.0 standard drinks    Types: 7 Standard drinks or equivalent per week  . Drug use: No  . Sexual activity: Not on file  Other Topics Concern  . Not on file  Social History Narrative  . Not on file   Social Determinants of Health   Financial Resource Strain:   . Difficulty of Paying Living Expenses:   Food Insecurity:   . Worried About Charity fundraiser in the Last Year:   . Arts development officer in the Last Year:   Transportation Needs:   . Film/video editor (Medical):   Marland Kitchen Lack of Transportation (Non-Medical):   Physical Activity:   . Days of Exercise per Week:   . Minutes of Exercise per Session:   Stress:   . Feeling of Stress :   Social Connections:   . Frequency of Communication with Friends and Family:   . Frequency of Social Gatherings with Friends and Family:   . Attends Religious Services:   . Active Member of Clubs or Organizations:   . Attends Archivist Meetings:   Marland Kitchen Marital Status:   Intimate Partner Violence:   . Fear of Current or Ex-Partner:   . Emotionally Abused:   Marland Kitchen Physically Abused:   . Sexually Abused:      PHYSICAL EXAM:  VS: BP 136/70   Pulse 69   Ht 5\' 10"  (1.778 m)   Wt 220 lb (99.8 kg)   BMI 31.57 kg/m  Physical Exam Gen: NAD, alert, cooperative with exam, well-appearing MSK:  Left hip: Some tenderness palpation over the left SI joint as well as the greater trochanter. Normal internal and external rotation. Normal strength with hip flexion. Weakness with hip abduction. Negative straight leg raise. Neurovascular intact   Aspiration/Injection Procedure Note Victor [name] 12/08/1935  Procedure: Injection Indications: Left SI joint pain.  Procedure Details Consent: Risks of procedure as well as the alternatives and risks of each were explained to the (patient/caregiver).  Consent for procedure obtained. Time Out: Verified patient identification, verified procedure, site/side was marked, verified correct patient position, special equipment/implants available, medications/allergies/relevent history reviewed, required imaging and test results available.  Performed.  The area was cleaned with iodine and alcohol swabs.    The left SI joint was injected using 1 cc's of 40 mg Kenalog and 4 cc's of 0.25% bupivacaine with a 22 3 1/2" needle.  Ultrasound was used. Images were obtained in long views showing the  injection.     A sterile dressing was applied.  Patient did tolerate procedure well.     ASSESSMENT & PLAN:   Chronic left SI joint pain Pain is acute on chronic in nature.  Has been dealing with the pain for over 15 years.  Has received greater trochanteric bursa injections previously with no improvement.  Has had low back pain as well.  Possible that the SI joint is playing more than the role. -SI joint injection. -Counseled on home exercise therapy and supportive care. -Could consider hip injection. -  Has an MRI of the lumbar spine scheduled.

## 2020-03-01 NOTE — Assessment & Plan Note (Signed)
Pain is acute on chronic in nature.  Has been dealing with the pain for over 15 years.  Has received greater trochanteric bursa injections previously with no improvement.  Has had low back pain as well.  Possible that the SI joint is playing more than the role. -SI joint injection. -Counseled on home exercise therapy and supportive care. -Could consider hip injection. -Has an MRI of the lumbar spine scheduled.

## 2020-03-01 NOTE — Patient Instructions (Signed)
Good to see you Please try ice  Please try the exercises and stretches   Please send me a message in MyChart with any questions or updates.  Please see me back in 4 weeks.   --Dr. [name]

## 2020-03-04 ENCOUNTER — Telehealth: Payer: Self-pay | Admitting: Family Medicine

## 2020-03-04 NOTE — Telephone Encounter (Signed)
Please see message and advise.  Thank you. ° °

## 2020-03-04 NOTE — Telephone Encounter (Signed)
.  bt

## 2020-03-04 NOTE — Telephone Encounter (Signed)
Received call from [place] representative stating the MRI for the left hip is pending denial and needs a peer to peer review for approval.  Please call [phone] to resolve.

## 2020-03-08 NOTE — Telephone Encounter (Signed)
Pt informed and he wanted Dr.C to have the paper document from his insurance company stating why the MRI was denied.  I told him that I would let Dr. Loletha Grayer know.

## 2020-03-08 NOTE — Telephone Encounter (Signed)
Please call pt to let him know insurance denied coverage for his MRI hip. He may have better luck getting this covered if ordered by specialist (he sees PM&R physician at [facility]) if that provider thinks it is necessary

## 2020-03-10 NOTE — Telephone Encounter (Signed)
I have paperwork and can mail to him or he can pick up.  I have also paced a call to insurance company to see if I can provide any information that would be helpful in approving the MRI.

## 2020-03-15 NOTE — Telephone Encounter (Signed)
Pt said that he already had a copy of paperwork from his insurance.

## 2020-03-22 ENCOUNTER — Other Ambulatory Visit: Payer: Self-pay

## 2020-03-23 ENCOUNTER — Encounter: Payer: Self-pay | Admitting: Family Medicine

## 2020-03-23 ENCOUNTER — Ambulatory Visit (INDEPENDENT_AMBULATORY_CARE_PROVIDER_SITE_OTHER): Payer: Medicare Other | Admitting: Family Medicine

## 2020-03-23 VITALS — BP 134/72 | HR 67 | Temp 98.0°F | Wt 219.0 lb

## 2020-03-23 DIAGNOSIS — G8929 Other chronic pain: Secondary | ICD-10-CM

## 2020-03-23 DIAGNOSIS — M25552 Pain in left hip: Secondary | ICD-10-CM

## 2020-03-23 DIAGNOSIS — J432 Centrilobular emphysema: Secondary | ICD-10-CM

## 2020-03-23 DIAGNOSIS — R42 Dizziness and giddiness: Secondary | ICD-10-CM | POA: Diagnosis not present

## 2020-03-23 DIAGNOSIS — M25551 Pain in right hip: Secondary | ICD-10-CM

## 2020-03-23 NOTE — Progress Notes (Signed)
Victor [name] is a 84 y.o. male  Chief Complaint  Patient presents with  . Dizziness    Patient is here today C/O dizzy spells.  States that they usually last 10-15 minutes but the one on 5.15.21 lasted 2 hours. Feels like he is spinning not the room.    HPI: Victor [name] is a 84 y.o. male who complains of  1. Episode of ? Dizziness while sitting at his computer around 12:30pm on 03/19/20. He had been sitting for about 30 min. No nausea or vomiting. No headache. No CP, no diaphoresis, no SOB. He held on furniture to make it to another room where he sat down. Symptoms resolved after 2 hrs. Pt states since that time "my head doesn't feel clear". No headache, no facial pressure, no ear pain, no runny nose or stuffy nose. He has Rx for meclizine and took it around 2-3pm on 03/19/20. He drinks 2-3 17oz bottles of water per day. No vision changes. No falls. H/o PVD, PAD, stent in Lt leg. He has a hx "dizzy spells" going back 10 years. Had full work-up at that time by ENT. 2. Needs new orders for MRI B/L hip, previously declined by insurance d/t "not enough info provided by PCP".  3. Pt overdue for annual cardio appt - pt will call to schedule 4. Pt has not heard from pulm to f/u PFTs - pt will call pulm to f/u (number included in AVS)   Past Medical History:  Diagnosis Date  . Arthritis   . Atherosclerosis   . Atherosclerotic PVD with intermittent claudication (HCC)    stent left leg  . BPH (benign prostatic hyperplasia)   . Bulging lumbar disc   . Cancer ([place]) 11/12/14   vocal cord  carcinoma in situ , radiation; thyroid cancer  . Carotid bruit   . Chronic kidney disease    chronic stage III  . COPD (chronic obstructive pulmonary disease) ([place])   . DDD (degenerative disc disease), lumbar   . Dysphonia   . Dysplasia of true vocal cord   . GERD (gastroesophageal reflux disease)   . H/O carotid atherosclerosis    b/l  . Hyperlipidemia   . Hypothyroidism   . Occasional tremors    left hand managed with propranolol  . Peripheral vascular angioplasty status with implants and grafts   . Precancerous lesion 03/05/2018   premelanoma removed from back.   . Radiation 03/10/15- 04/18/16   Larynx  . Sleep apnea    hasn't used CPAP in years  . Spondylosis of lumbosacral region   . Thyroid disease     Past Surgical History:  Procedure Laterality Date  . APPENDECTOMY    . COLONOSCOPY W/ POLYPECTOMY    . DIODE LASER APPLICATION Left 78/24/2353   Procedure: DIODE LASER APPLICATION;  Surgeon: Hayden Pedro, MD;  Location: [place];  Service: Ophthalmology;  Laterality: Left;  . MEMBRANE PEEL Left 08/18/2019   Procedure: MEMBRANE PEEL;  Surgeon: Hayden Pedro, MD;  Location: [place];  Service: Ophthalmology;  Laterality: Left;  . MOUTH SURGERY     tooth extraction  . PARS PLANA VITRECTOMY Left 08/18/2019   PARS PLANA VITRECTOMY 27 GAUGE (Left)  . PARS PLANA VITRECTOMY 27 GAUGE Left 08/18/2019   Procedure: PARS PLANA VITRECTOMY 27 GAUGE;  Surgeon: Hayden Pedro, MD;  Location: [place];  Service: Ophthalmology;  Laterality: Left;  . PHOTOCOAGULATION WITH LASER Left 08/18/2019   Procedure: PHOTOCOAGULATION WITH LASER;  Surgeon: Tempie Hoist  D, MD;  Location: [place];  Service: Ophthalmology;  Laterality: Left;  . THYROID SURGERY    . TONSILLECTOMY    . vocal cord biopsy  11/12/14   squamous cell carcinoma in situ    Social History   Socioeconomic History  . Marital status: Married    Spouse name: Not on file  . Number of children: Not on file  . Years of education: Not on file  . Highest education level: Not on file  Occupational History  . Not on file  Tobacco Use  . Smoking status: Former Smoker    Packs/day: 2.00    Years: 51.00    Pack years: 102.00    Types: Cigarettes    Start date: 30    Quit date: 03/05/2004    Years since quitting: 16.0  . Smokeless tobacco: Never Used  Substance and Sexual Activity  . Alcohol use: Yes    Alcohol/week: 7.0 standard  drinks    Types: 7 Standard drinks or equivalent per week  . Drug use: No  . Sexual activity: Not on file  Other Topics Concern  . Not on file  Social History Narrative  . Not on file   Social Determinants of Health   Financial Resource Strain:   . Difficulty of Paying Living Expenses:   Food Insecurity:   . Worried About Charity fundraiser in the Last Year:   . Arboriculturist in the Last Year:   Transportation Needs:   . Film/video editor (Medical):   Marland Kitchen Lack of Transportation (Non-Medical):   Physical Activity:   . Days of Exercise per Week:   . Minutes of Exercise per Session:   Stress:   . Feeling of Stress :   Social Connections:   . Frequency of Communication with Friends and Family:   . Frequency of Social Gatherings with Friends and Family:   . Attends Religious Services:   . Active Member of Clubs or Organizations:   . Attends Archivist Meetings:   Marland Kitchen Marital Status:   Intimate Partner Violence:   . Fear of Current or Ex-Partner:   . Emotionally Abused:   Marland Kitchen Physically Abused:   . Sexually Abused:     History reviewed. No pertinent family history.   Immunization History  Administered Date(s) Administered  . Influenza, High Dose Seasonal PF 07/06/2013, 08/14/2018, 08/10/2019  . Influenza,inj,Quad PF,6+ Mos 08/10/2015, 07/03/2016  . Influenza,inj,quad, With Preservative 08/09/2014  . Influenza-Unspecified 08/09/2014, 08/10/2015, 07/03/2016, 08/12/2017  . PFIZER SARS-COV-2 Vaccination 12/13/2019, 01/07/2020  . Pneumococcal Conjugate-13 09/20/2014  . Pneumococcal Polysaccharide-23 08/05/2013  . Td 10/11/2017  . Zoster 07/06/2013  . Zoster Recombinat (Shingrix) 09/16/2017    Outpatient Encounter Medications as of 03/23/2020  Medication Sig Note  . aspirin 81 MG tablet Take 81 mg by mouth daily. 08/13/2019: If remember  . Cholecalciferol (VITAMIN D3) 1000 units CAPS Take 1,000 Units by mouth daily.  08/13/2019: When remembers  . doxazosin  (CARDURA) 2 MG tablet Take 1 tablet (2 mg total) by mouth at bedtime.   . fluticasone (FLONASE) 50 MCG/ACT nasal spray Place 1 spray into both nostrils 2 (two) times daily. Aller-flo   . HYDROcodone-acetaminophen (NORCO/VICODIN) 5-325 MG tablet Take 1 tablet by mouth every 6 (six) hours as needed for moderate pain.   Marland Kitchen levothyroxine (SYNTHROID) 150 MCG tablet TAKE ONE TABLET BY MOUTH ONE TIME DAILY  BEFORE BREAKFAST   . Loratadine (CLARITIN PO) Take 10 mg by mouth at bedtime. Allertec   .  Melatonin 10 MG TABS Take by mouth at bedtime.   . NON FORMULARY Take 3 drops by mouth daily. HEMP OIL   . omeprazole (PRILOSEC) 20 MG capsule Take 1 capsule (20 mg total) by mouth 2 (two) times daily before a meal.   . propranolol ER (INDERAL LA) 80 MG 24 hr capsule Take 1 capsule (80 mg total) by mouth daily.   . simvastatin (ZOCOR) 40 MG tablet Take 1 tablet (40 mg total) by mouth at bedtime.   . traMADol (ULTRAM) 50 MG tablet TAKE ONE TABLET BY MOUTH EVERY TWELVE HOURS AS NEEDED FOR SEVERE PAIN   . [DISCONTINUED] bacitracin-polymyxin b (POLYSPORIN) ophthalmic ointment Place 1 application into the left eye 3 (three) times daily. apply to eye every 12 hours while awake   . [DISCONTINUED] carboxymethylcellulose (REFRESH PLUS) 0.5 % SOLN Place 2 drops into both eyes 4 (four) times daily.   . [DISCONTINUED] gatifloxacin (ZYMAXID) 0.5 % SOLN Place 1 drop into the left eye 4 (four) times daily.   . [DISCONTINUED] prednisoLONE acetate (PRED FORTE) 1 % ophthalmic suspension Place 1 drop into the left eye 4 (four) times daily.    No facility-administered encounter medications on file as of 03/23/2020.     ROS: Pertinent positives and negatives noted in HPI. Remainder of ROS non-contributory    No Known Allergies  BP 134/72 (BP Location: Right Arm, Patient Position: Sitting, Cuff Size: Normal)   Pulse 67   Temp 98 F (36.7 C) (Oral)   Wt 219 lb (99.3 kg)   SpO2 94%   BMI 31.42 kg/m    BP Readings from  Last 3 Encounters:  03/23/20 134/72  03/01/20 136/70  02/26/20 (!) 118/58   Pulse Readings from Last 3 Encounters:  03/23/20 67  03/01/20 69  02/26/20 69     Physical Exam  Constitutional: He is oriented to person, place, and time. He appears well-developed and well-nourished. No distress.  Eyes: Pupils are equal, round, and reactive to light. Conjunctivae and EOM are normal. Right eye exhibits normal extraocular motion and no nystagmus. Left eye exhibits normal extraocular motion and no nystagmus.  Neck: Normal carotid pulses present. Carotid bruit is not present.  Cardiovascular: Normal rate and regular rhythm.  Pulmonary/Chest: Effort normal. No respiratory distress.  Musculoskeletal:        General: No edema.     Cervical back: Neck supple.  Neurological: He is alert and oriented to person, place, and time.  Psychiatric: He has a normal mood and affect. His behavior is normal.     A/P:  1. Dizziness 2. Episodic lightheadedness - intermittent x 10 years, had full neuro, cardio, ENT eval at time of first episode with no etiology discovered - 2 episodes in past 6 mo, no associated symptoms - pt has macular degeneration but denies worsening vision and UTD on eye exam - ensure adequate hydration, frequent meals/snacks - normal echo in 07/2019 - VAS US CAROTID; Future  2. Chronic hip pain, bilateral - MR HIP RIGHT WO CONTRAST; Future - MR HIP LEFT WO CONTRAST; Future - some improvement with Lt SI joint injection but still difficult to lay on Lt side  3. Centrilobular emphysema ([place]) - pt will call [facility] pulm to f/u on PTF results and plan moving forward   This visit occurred during the SARS-CoV-2 public health emergency.  Safety protocols were in place, including screening questions prior to the visit, additional usage of staff PPE, and extensive cleaning of exam room while observing appropriate contact  time as indicated for disinfecting solutions.

## 2020-03-23 NOTE — Patient Instructions (Signed)
[phone] [facility]

## 2020-03-26 ENCOUNTER — Ambulatory Visit
Admission: RE | Admit: 2020-03-26 | Discharge: 2020-03-26 | Disposition: A | Discharge status: Home / Self Care | Payer: Medicare Other | Source: Ambulatory Visit | Attending: Family Medicine | Admitting: Family Medicine

## 2020-03-26 DIAGNOSIS — M5136 Other intervertebral disc degeneration, lumbar region: Secondary | ICD-10-CM

## 2020-03-29 ENCOUNTER — Ambulatory Visit: Payer: Medicare Other | Admitting: Family Medicine

## 2020-03-30 ENCOUNTER — Ambulatory Visit: Payer: Self-pay

## 2020-03-30 ENCOUNTER — Ambulatory Visit (INDEPENDENT_AMBULATORY_CARE_PROVIDER_SITE_OTHER): Payer: Medicare Other | Admitting: Family Medicine

## 2020-03-30 ENCOUNTER — Ambulatory Visit (HOSPITAL_BASED_OUTPATIENT_CLINIC_OR_DEPARTMENT_OTHER)
Admission: RE | Admit: 2020-03-30 | Discharge: 2020-03-30 | Disposition: A | Discharge status: Home / Self Care | Payer: Medicare Other | Source: Ambulatory Visit | Attending: Family Medicine | Admitting: Family Medicine

## 2020-03-30 ENCOUNTER — Other Ambulatory Visit: Payer: Self-pay

## 2020-03-30 ENCOUNTER — Encounter: Payer: Self-pay | Admitting: Family Medicine

## 2020-03-30 VITALS — BP 150/75 | HR 71 | Ht 70.0 in | Wt 219.0 lb

## 2020-03-30 DIAGNOSIS — M533 Sacrococcygeal disorders, not elsewhere classified: Secondary | ICD-10-CM | POA: Diagnosis not present

## 2020-03-30 DIAGNOSIS — G8929 Other chronic pain: Secondary | ICD-10-CM

## 2020-03-30 DIAGNOSIS — M722 Plantar fascial fibromatosis: Secondary | ICD-10-CM | POA: Diagnosis not present

## 2020-03-30 DIAGNOSIS — M79672 Pain in left foot: Secondary | ICD-10-CM | POA: Diagnosis not present

## 2020-03-30 MED ORDER — METHYLPREDNISOLONE ACETATE 40 MG/ML IJ SUSP
40.0000 mg | Freq: Once | INTRAMUSCULAR | Status: AC
  Administered 2020-03-30: 40 mg via INTRA_ARTICULAR

## 2020-03-30 NOTE — Assessment & Plan Note (Signed)
Clinically he has symptoms suggestive of plantar fasciitis.  Measurement was mildly above normal.  Independent review of the x-ray shows a small plantar calcaneal spur. -Injection today. -Counseled on home exercise therapy and supportive care. -Could consider physical therapy or midfoot arch strap.

## 2020-03-30 NOTE — Patient Instructions (Signed)
Good to see you Please try heat for the back  Please try ice for the foot  Please try the exercises for the foot  Please send me a message in MyChart with any questions or updates.  Please see me back in 4-6 weeks.   --Dr. [name]

## 2020-03-30 NOTE — Progress Notes (Addendum)
Victor [name] - 84 y.o. male MRN [id number]  Date of birth: 11-12-1935  SUBJECTIVE:  Including CC & ROS.  Chief Complaint  Patient presents with  . Follow-up    left SI joint    Victor [name] is a 84 y.o. male that is presenting with worsening left heel pain and following up for his left lower back pain.  He received an SI joint injection that seemed to help some of his pain.  He still has some pain that occurs intermittently from time to time.  Most recently he has had worsening of his left heel pain.  He has a history of plantar fasciitis on the right.  Symptoms seem to be worse in the morning.  He has to walk on his tiptoes..  Independent review of the MRI lumbar spine from 5/22 shows mild facet changes but no spinal stenosis.   Review of Systems See HPI   HISTORY: Past Medical, Surgical, Social, and Family History Reviewed & Updated per EMR.   Pertinent Historical Findings include:  Past Medical History:  Diagnosis Date  . Arthritis   . Atherosclerosis   . Atherosclerotic PVD with intermittent claudication (HCC)    stent left leg  . BPH (benign prostatic hyperplasia)   . Bulging lumbar disc   . Cancer ([place]) 11/12/14   vocal cord  carcinoma in situ , radiation; thyroid cancer  . Carotid bruit   . Chronic kidney disease    chronic stage III  . COPD (chronic obstructive pulmonary disease) ([place])   . DDD (degenerative disc disease), lumbar   . Dysphonia   . Dysplasia of true vocal cord   . GERD (gastroesophageal reflux disease)   . H/O carotid atherosclerosis    b/l  . Hyperlipidemia   . Hypothyroidism   . Occasional tremors    left hand managed with propranolol  . Peripheral vascular angioplasty status with implants and grafts   . Precancerous lesion 03/05/2018   premelanoma removed from back.   . Radiation 03/10/15- 04/18/16   Larynx  . Sleep apnea    hasn't used CPAP in years  . Spondylosis of lumbosacral region   . Thyroid disease     Past Surgical History:    Procedure Laterality Date  . APPENDECTOMY    . COLONOSCOPY W/ POLYPECTOMY    . DIODE LASER APPLICATION Left 20/94/7096   Procedure: DIODE LASER APPLICATION;  Surgeon: Hayden Pedro, MD;  Location: [place];  Service: Ophthalmology;  Laterality: Left;  . MEMBRANE PEEL Left 08/18/2019   Procedure: MEMBRANE PEEL;  Surgeon: Hayden Pedro, MD;  Location: [place];  Service: Ophthalmology;  Laterality: Left;  . MOUTH SURGERY     tooth extraction  . PARS PLANA VITRECTOMY Left 08/18/2019   PARS PLANA VITRECTOMY 27 GAUGE (Left)  . PARS PLANA VITRECTOMY 27 GAUGE Left 08/18/2019   Procedure: PARS PLANA VITRECTOMY 27 GAUGE;  Surgeon: Hayden Pedro, MD;  Location: [place];  Service: Ophthalmology;  Laterality: Left;  . PHOTOCOAGULATION WITH LASER Left 08/18/2019   Procedure: PHOTOCOAGULATION WITH LASER;  Surgeon: Hayden Pedro, MD;  Location: [place];  Service: Ophthalmology;  Laterality: Left;  . THYROID SURGERY    . TONSILLECTOMY    . vocal cord biopsy  11/12/14   squamous cell carcinoma in situ    No family history on file.  Social History   Socioeconomic History  . Marital status: Married    Spouse name: Not on file  . Number of children: Not  on file  . Years of education: Not on file  . Highest education level: Not on file  Occupational History  . Not on file  Tobacco Use  . Smoking status: Former Smoker    Packs/day: 2.00    Years: 51.00    Pack years: 102.00    Types: Cigarettes    Start date: 60    Quit date: 03/05/2004    Years since quitting: 16.0  . Smokeless tobacco: Never Used  Substance and Sexual Activity  . Alcohol use: Yes    Alcohol/week: 7.0 standard drinks    Types: 7 Standard drinks or equivalent per week  . Drug use: No  . Sexual activity: Not on file  Other Topics Concern  . Not on file  Social History Narrative  . Not on file   Social Determinants of Health   Financial Resource Strain:   . Difficulty of Paying Living Expenses:   Food Insecurity:    . Worried About Charity fundraiser in the Last Year:   . Arboriculturist in the Last Year:   Transportation Needs:   . Film/video editor (Medical):   Marland Kitchen Lack of Transportation (Non-Medical):   Physical Activity:   . Days of Exercise per Week:   . Minutes of Exercise per Session:   Stress:   . Feeling of Stress :   Social Connections:   . Frequency of Communication with Friends and Family:   . Frequency of Social Gatherings with Friends and Family:   . Attends Religious Services:   . Active Member of Clubs or Organizations:   . Attends Archivist Meetings:   Marland Kitchen Marital Status:   Intimate Partner Violence:   . Fear of Current or Ex-Partner:   . Emotionally Abused:   Marland Kitchen Physically Abused:   . Sexually Abused:      PHYSICAL EXAM:  VS: BP (!) 150/75   Pulse 71   Ht 5\' 10"  (1.778 m)   Wt 219 lb (99.3 kg)   BMI 31.42 kg/m  Physical Exam Gen: NAD, alert, cooperative with exam, well-appearing MSK:  Left foot: Tenderness palpation at the base of the calcaneus. No ecchymosis. Normal strength resistance. Some limited dorsiflexion when compared to the contralateral side. Neurovascular intact  Limited ultrasound: left heel:  The plantar fascia was mildly thickened on measurement.   Summary: findings suggest plantar fasciitis.   Ultrasound and interpretation by Clearance Coots, MD   Aspiration/Injection Procedure Note Victor [name] [birth year]  Procedure: Injection Indications: Left heel pain  Procedure Details Consent: Risks of procedure as well as the alternatives and risks of each were explained to the (patient/caregiver).  Consent for procedure obtained. Time Out: Verified patient identification, verified procedure, site/side was marked, verified correct patient position, special equipment/implants available, medications/allergies/relevent history reviewed, required imaging and test results available.  Performed.  The area was cleaned with iodine and  alcohol swabs.    The left plantar fascia was injected using 1 cc's of 40 mg Depo-Medrol and 2 cc's of 0.25% bupivacaine with a 25 1 1/2" needle.  Ultrasound was used. Images were obtained in long views showing the injection.     A sterile dressing was applied.  Patient did tolerate procedure well.     ASSESSMENT & PLAN:   Chronic left SI joint pain Hent he has had an improvement of his symptoms with the SI joint injection.  He has mild to moderate facet changes on the MRI of the lumbar spine.   -  Counseled on home exercise therapy and supportive care. - If his pain is worsening that would consider facet injections.  Plantar fasciitis of left foot Clinically he has symptoms suggestive of plantar fasciitis.  Measurement was mildly above normal.  Independent review of the x-ray shows a small plantar calcaneal spur. -Injection today. -Counseled on home exercise therapy and supportive care. -Could consider physical therapy or midfoot arch strap.

## 2020-03-30 NOTE — Assessment & Plan Note (Signed)
Hent he has had an improvement of his symptoms with the SI joint injection.  He has mild to moderate facet changes on the MRI of the lumbar spine.   -Counseled on home exercise therapy and supportive care. - If his pain is worsening that would consider facet injections.

## 2020-04-06 ENCOUNTER — Ambulatory Visit (HOSPITAL_COMMUNITY)
Admission: RE | Admit: 2020-04-06 | Discharge: 2020-04-06 | Disposition: A | Discharge status: Home / Self Care | Payer: Medicare Other | Source: Ambulatory Visit | Attending: Cardiology | Admitting: Cardiology

## 2020-04-06 ENCOUNTER — Other Ambulatory Visit: Payer: Self-pay

## 2020-04-06 DIAGNOSIS — R42 Dizziness and giddiness: Secondary | ICD-10-CM | POA: Diagnosis not present

## 2020-04-07 ENCOUNTER — Ambulatory Visit: Payer: Medicare Other | Admitting: Podiatry

## 2020-04-07 ENCOUNTER — Encounter: Payer: Self-pay | Admitting: Family Medicine

## 2020-04-13 ENCOUNTER — Encounter: Payer: Self-pay | Admitting: Family Medicine

## 2020-04-14 ENCOUNTER — Telehealth (HOSPITAL_COMMUNITY): Payer: Self-pay | Admitting: Family Medicine

## 2020-04-14 NOTE — Telephone Encounter (Signed)
Left VM for patient. If he calls back please have him speak with a nurse/CMA and inform that I usually use Dr. Mayer Camel at [facility] or Dr. Alvan Dame at [facility] for hips.   If any questions then please take the best time and phone number to call and I will try to call him back.   Rosemarie Ax, MD [facility] 04/14/2020, 8:37 AM

## 2020-04-21 NOTE — Telephone Encounter (Signed)
Received a message from patient stating that he never heard back about his PFT results. PFT was completed back in December 2020.   RA, please advise. Thanks!

## 2020-04-23 NOTE — Telephone Encounter (Signed)
My apologies for the delay. PFTs showed lung function was decreased at 50% , not significant response to bronchodilator -this would be consistent with COPD/emphysema  Would advise trial of LAMA/LABA combination such as Anoro -for 2 weeks.  Please provide him with sample.  Alternatively arrange follow-up for him in 2 weeks with APP to assess if medication is working

## 2020-04-25 MED ORDER — ANORO ELLIPTA 62.5-25 MCG/INH IN AEPB
1.0000 | INHALATION_SPRAY | Freq: Every day | RESPIRATORY_TRACT | 0 refills | Status: DC
Start: 2020-04-25 — End: 2020-04-25

## 2020-04-25 MED ORDER — ANORO ELLIPTA 62.5-25 MCG/INH IN AEPB
1.0000 | INHALATION_SPRAY | Freq: Every day | RESPIRATORY_TRACT | 0 refills | Status: DC
Start: 2020-04-25 — End: 2020-08-05

## 2020-04-25 NOTE — Telephone Encounter (Signed)
Patient contacted by phone to pick up free sample of Anoro. He will schedule an appointment with a NP at that time.

## 2020-04-25 NOTE — Addendum Note (Signed)
Addended by: Tery Sanfilippo R on: 04/25/2020 08:55 AM   Modules accepted: Orders

## 2020-04-29 ENCOUNTER — Other Ambulatory Visit: Payer: Self-pay

## 2020-04-29 ENCOUNTER — Ambulatory Visit
Admission: RE | Admit: 2020-04-29 | Discharge: 2020-04-29 | Disposition: A | Discharge status: Home / Self Care | Payer: Medicare Other | Source: Ambulatory Visit | Attending: Family Medicine | Admitting: Family Medicine

## 2020-04-29 DIAGNOSIS — G8929 Other chronic pain: Secondary | ICD-10-CM

## 2020-04-29 DIAGNOSIS — M25552 Pain in left hip: Secondary | ICD-10-CM

## 2020-05-03 ENCOUNTER — Ambulatory Visit (INDEPENDENT_AMBULATORY_CARE_PROVIDER_SITE_OTHER): Payer: Medicare Other | Admitting: Family Medicine

## 2020-05-03 ENCOUNTER — Other Ambulatory Visit: Payer: Self-pay

## 2020-05-03 ENCOUNTER — Encounter: Payer: Self-pay | Admitting: Family Medicine

## 2020-05-03 VITALS — BP 137/65 | HR 72 | Ht 70.0 in | Wt 219.0 lb

## 2020-05-03 DIAGNOSIS — M25552 Pain in left hip: Secondary | ICD-10-CM

## 2020-05-03 DIAGNOSIS — M722 Plantar fascial fibromatosis: Secondary | ICD-10-CM

## 2020-05-03 DIAGNOSIS — M25551 Pain in right hip: Secondary | ICD-10-CM

## 2020-05-03 NOTE — Progress Notes (Signed)
Victor [name] - 84 y.o. male MRN [id number]  Date of birth: Aug 28, 1936  SUBJECTIVE:  Including CC & ROS.  Chief Complaint  Patient presents with  . Follow-up    left foot    Victor [name] is a 84 y.o. male that is presenting with bilateral hip pain and ongoing left foot pain.  Hip pain is lateral in nature.  He has received a greater trochanteric injection with limited improvement.  He received a plantar fascia injection and has had some improvement of his pain.  Independent review of the MRI of the left and right hip from 6/25 shows muscle edema of the gluteus minimus and the gluteus maximus with mild OA of the hips.  Review of Systems See HPI   HISTORY: Past Medical, Surgical, Social, and Family History Reviewed & Updated per EMR.   Pertinent Historical Findings include:  Past Medical History:  Diagnosis Date  . Arthritis   . Atherosclerosis   . Atherosclerotic PVD with intermittent claudication (HCC)    stent left leg  . BPH (benign prostatic hyperplasia)   . Bulging lumbar disc   . Cancer ([place]) 11/12/14   vocal cord  carcinoma in situ , radiation; thyroid cancer  . Carotid bruit   . Chronic kidney disease    chronic stage III  . COPD (chronic obstructive pulmonary disease) ([place])   . DDD (degenerative disc disease), lumbar   . Dysphonia   . Dysplasia of true vocal cord   . GERD (gastroesophageal reflux disease)   . H/O carotid atherosclerosis    b/l  . Hyperlipidemia   . Hypothyroidism   . Occasional tremors    left hand managed with propranolol  . Peripheral vascular angioplasty status with implants and grafts   . Precancerous lesion 03/05/2018   premelanoma removed from back.   . Radiation 03/10/15- 04/18/16   Larynx  . Sleep apnea    hasn't used CPAP in years  . Spondylosis of lumbosacral region   . Thyroid disease     Past Surgical History:  Procedure Laterality Date  . APPENDECTOMY    . COLONOSCOPY W/ POLYPECTOMY    . DIODE LASER APPLICATION Left  03/55/9741   Procedure: DIODE LASER APPLICATION;  Surgeon: Hayden Pedro, MD;  Location: [facility];  Service: Ophthalmology;  Laterality: Left;  . MEMBRANE PEEL Left 08/18/2019   Procedure: MEMBRANE PEEL;  Surgeon: Hayden Pedro, MD;  Location: [place] [facility];  Service: Ophthalmology;  Laterality: Left;  . MOUTH SURGERY     tooth extraction  . PARS PLANA VITRECTOMY Left 08/18/2019   PARS PLANA VITRECTOMY 27 GAUGE (Left)  . PARS PLANA VITRECTOMY 27 GAUGE Left 08/18/2019   Procedure: PARS PLANA VITRECTOMY 27 GAUGE;  Surgeon: Hayden Pedro, MD;  Location: [facility];  Service: Ophthalmology;  Laterality: Left;  . PHOTOCOAGULATION WITH LASER Left 08/18/2019   Procedure: PHOTOCOAGULATION WITH LASER;  Surgeon: Hayden Pedro, MD;  Location: [facility];  Service: Ophthalmology;  Laterality: Left;  . THYROID SURGERY    . TONSILLECTOMY    . vocal cord biopsy  11/12/14   squamous cell carcinoma in situ    No family history on file.  Social History   Socioeconomic History  . Marital status: Married    Spouse name: Not on file  . Number of children: Not on file  . Years of education: Not on file  . Highest education level: Not on file  Occupational History  . Not on file  Tobacco Use  .  Smoking status: Former Smoker    Packs/day: 2.00    Years: 51.00    Pack years: 102.00    Types: Cigarettes    Start date: 74    Quit date: 03/05/2004    Years since quitting: 16.1  . Smokeless tobacco: Never Used  Vaping Use  . Vaping Use: Never used  Substance and Sexual Activity  . Alcohol use: Yes    Alcohol/week: 7.0 standard drinks    Types: 7 Standard drinks or equivalent per week  . Drug use: No  . Sexual activity: Not on file  Other Topics Concern  . Not on file  Social History Narrative  . Not on file   Social Determinants of Health   Financial Resource Strain:   . Difficulty of Paying Living Expenses:   Food Insecurity:   . Worried About Charity fundraiser in the Last Year:   . Academic librarian in the Last Year:   Transportation Needs:   . Film/video editor (Medical):   Marland Kitchen Lack of Transportation (Non-Medical):   Physical Activity:   . Days of Exercise per Week:   . Minutes of Exercise per Session:   Stress:   . Feeling of Stress :   Social Connections:   . Frequency of Communication with Friends and Family:   . Frequency of Social Gatherings with Friends and Family:   . Attends Religious Services:   . Active Member of Clubs or Organizations:   . Attends Archivist Meetings:   Marland Kitchen Marital Status:   Intimate Partner Violence:   . Fear of Current or Ex-Partner:   . Emotionally Abused:   Marland Kitchen Physically Abused:   . Sexually Abused:      PHYSICAL EXAM:  VS: BP 137/65   Pulse 72   Ht 5\' 10"  (1.778 m)   Wt 219 lb (99.3 kg)   BMI 31.42 kg/m  Physical Exam Gen: NAD, alert, cooperative with exam, well-appearing MSK:  Left and right hip: Normal flexion. Negative straight leg raise. Left foot: Fairly cavus foot. No obvious swelling or ecchymosis. Some tenderness to palpation at the plantar calcaneus. Neurovascularly intact     ASSESSMENT & PLAN:   Plantar fasciitis of left foot Minimal improvement with previous injection.  Still seems likely has plantar fasciitis. -Provided heel lift with cut out portion to try to float the area. -Midfoot arch strap. -Counseled on home exercise therapy and supportive care. -Could consider physical therapy.  Bilateral hip pain MRI was performed showing mild degenerative changes of the hip joints as well as some muscle edema laterally.  There appears to be a cam lesion of the femoral head on the left which could represent his pain. -Referral to orthopedics -Could consider joint injection.

## 2020-05-03 NOTE — Patient Instructions (Signed)
Good to see  you  Please continue the exercises Please let me know how the heel lift and strap do for you.  The orthopedic office will give you a call  Please send me a message in [place] with any questions or updates.  Please see me back in 6 weeks.   --Dr. [name]

## 2020-05-04 DIAGNOSIS — M25551 Pain in right hip: Secondary | ICD-10-CM | POA: Insufficient documentation

## 2020-05-04 DIAGNOSIS — M25552 Pain in left hip: Secondary | ICD-10-CM | POA: Insufficient documentation

## 2020-05-04 NOTE — Assessment & Plan Note (Signed)
Minimal improvement with previous injection.  Still seems likely has plantar fasciitis. -Provided heel lift with cut out portion to try to float the area. -Midfoot arch strap. -Counseled on home exercise therapy and supportive care. -Could consider physical therapy.

## 2020-05-04 NOTE — Assessment & Plan Note (Signed)
MRI was performed showing mild degenerative changes of the hip joints as well as some muscle edema laterally.  There appears to be a cam lesion of the femoral head on the left which could represent his pain. -Referral to orthopedics -Could consider joint injection.

## 2020-05-23 NOTE — Progress Notes (Signed)
@Patient  ID: Victor [name], male    DOB: 24-Mar-1936, 84 y.o.   MRN: [id number]  Chief Complaint  Patient presents with  . Follow-up    no new issues    Referring provider: Ronnald Nian, DO  HPI: 84 year old male, former smoker quit in 2005 (102-pack-year history).  Past medical history significant for thyroid cancer, glottic cancer, GERD, chronic kidney disease.  Patient of Dr. Elsworth Soho, seen for initial consult 07/02/19 for dyspnea.  Last seen by pulmonary nurse practitioner on 10/02/2019.  Patient has a history of laryngeal cancer and underwent radiation therapy and excision of his left anterior vocal cord lesion by ENT in 2016.  Patient required transient G-tube for dysphagia.  He was given Pulmicort by primary care which he reports of little benefit.  Chest x-ray showed emphysema.  Needs pulmonary function testing to further evaluate dyspnea.  05/24/2020 Patient presents today for 21-month follow-up.  Pulmonary function testing in December 2020 showed moderate obstruction.  He was given a sample of Anoro in June 2021. Reports blurred vision with Anoro.  He reports dyspnea on exertion for the last year. He has daily productive cough. Occasional chest tightness. He is unhappy that he did not hear back results of PFTs until June.                                                                                                                                                                                                                                                                                   Pulmonary function testing: 10/13/2019-FVC 2.96 (72%), FEV1 1.64 (56%), ratio 55, TLC 93%, DLCOunc 14.3 (58%), +BD 12% FEV1  Cardiac testing: Echocardiogram in September 2020 showed normal left ventricular size, EF 34%, grade 1 diastolic dysfunction, grade 2 mitral regurgitation.   No Known Allergies  Immunization History  Administered Date(s) Administered  . Influenza, High Dose Seasonal PF  07/06/2013, 08/14/2018, 08/10/2019  . Influenza,inj,Quad PF,6+ Mos 08/10/2015, 07/03/2016  . Influenza,inj,quad, With Preservative 08/09/2014  . Influenza-Unspecified 08/09/2014, 08/10/2015, 07/03/2016, 08/12/2017  . PFIZER SARS-COV-2 Vaccination 12/13/2019, 01/07/2020  . Pneumococcal Conjugate-13 09/20/2014  . Pneumococcal Polysaccharide-23 08/05/2013  . Td 10/11/2017  . Zoster 07/06/2013  .  Zoster Recombinat (Shingrix) 09/16/2017    Past Medical History:  Diagnosis Date  . Arthritis   . Atherosclerosis   . Atherosclerotic PVD with intermittent claudication (HCC)    stent left leg  . BPH (benign prostatic hyperplasia)   . Bulging lumbar disc   . Cancer ([place]) 11/12/14   vocal cord  carcinoma in situ , radiation; thyroid cancer  . Carotid bruit   . Chronic kidney disease    chronic stage III  . COPD (chronic obstructive pulmonary disease) ([place])   . DDD (degenerative disc disease), lumbar   . Dysphonia   . Dysplasia of true vocal cord   . GERD (gastroesophageal reflux disease)   . H/O carotid atherosclerosis    b/l  . Hyperlipidemia   . Hypothyroidism   . Occasional tremors    left hand managed with propranolol  . Peripheral vascular angioplasty status with implants and grafts   . Precancerous lesion 03/05/2018   premelanoma removed from back.   . Radiation 03/10/15- 04/18/16   Larynx  . Sleep apnea    hasn't used CPAP in years  . Spondylosis of lumbosacral region   . Thyroid disease     Tobacco History: Social History   Tobacco Use  Smoking Status Former Smoker  . Packs/day: 2.00  . Years: 51.00  . Pack years: 102.00  . Types: Cigarettes  . Start date: 49  . Quit date: 03/05/2004  . Years since quitting: 16.2  Smokeless Tobacco Never Used   Counseling given: Not Answered   Outpatient Medications Prior to Visit  Medication Sig Dispense Refill  . aspirin 81 MG tablet Take 81 mg by mouth daily.    . Cholecalciferol (VITAMIN D3) 1000 units CAPS Take 1,000  Units by mouth daily.     Marland Kitchen doxazosin (CARDURA) 2 MG tablet Take 1 tablet (2 mg total) by mouth at bedtime. 90 tablet 3  . fluticasone (FLONASE) 50 MCG/ACT nasal spray Place 1 spray into both nostrils 2 (two) times daily. Aller-flo    . HYDROcodone-acetaminophen (NORCO/VICODIN) 5-325 MG tablet Take 1 tablet by mouth every 6 (six) hours as needed for moderate pain. 30 tablet 0  . levothyroxine (SYNTHROID) 150 MCG tablet TAKE ONE TABLET BY MOUTH ONE TIME DAILY  BEFORE BREAKFAST 90 tablet 3  . Loratadine (CLARITIN PO) Take 10 mg by mouth at bedtime. Allertec    . Melatonin 10 MG TABS Take by mouth at bedtime.    . NON FORMULARY Take 3 drops by mouth daily. HEMP OIL    . omeprazole (PRILOSEC) 20 MG capsule Take 1 capsule (20 mg total) by mouth 2 (two) times daily before a meal. 180 capsule 3  . propranolol ER (INDERAL LA) 80 MG 24 hr capsule Take 1 capsule (80 mg total) by mouth daily. 90 capsule 3  . simvastatin (ZOCOR) 40 MG tablet Take 1 tablet (40 mg total) by mouth at bedtime. 90 tablet 3  . traMADol (ULTRAM) 50 MG tablet TAKE ONE TABLET BY MOUTH EVERY TWELVE HOURS AS NEEDED FOR SEVERE PAIN 60 tablet 0  . umeclidinium-vilanterol (ANORO ELLIPTA) 62.5-25 MCG/INH AEPB Inhale 1 puff into the lungs daily. (Patient not taking: Reported on 05/24/2020) 60 each 0   No facility-administered medications prior to visit.    Review of Systems  Review of Systems  Constitutional: Negative.   HENT: Negative.   Respiratory: Positive for cough and shortness of breath.        Dyspnea on exertion  Cardiovascular: Negative.    Physical  Exam  BP 130/70 (BP Location: Left Arm, Cuff Size: Normal)   Pulse 64   Temp (!) 97.3 F (36.3 C) (Oral)   Ht 5\' 10"  (1.778 m)   Wt 224 lb 3.2 oz (101.7 kg)   SpO2 94%   BMI 32.17 kg/m  Physical Exam Constitutional:      Appearance: Normal appearance.  HENT:     Head: Normocephalic and atraumatic.     Right Ear: External ear normal.     Left Ear: External ear  normal.     Ears:     Comments: Partially impacted cerumen bilateral ear canals    Mouth/Throat:     Mouth: Mucous membranes are moist.     Pharynx: Oropharynx is clear.  Cardiovascular:     Rate and Rhythm: Normal rate and regular rhythm.  Pulmonary:     Effort: Pulmonary effort is normal.     Breath sounds: Normal breath sounds. No stridor. No wheezing or rales.  Musculoskeletal:        General: Normal range of motion.  Skin:    General: Skin is warm and dry.  Neurological:     General: No focal deficit present.     Mental Status: He is alert and oriented to person, place, and time. Mental status is at baseline.  Psychiatric:        Mood and Affect: Mood normal.        Behavior: Behavior normal.        Thought Content: Thought content normal.        Judgment: Judgment normal.      Lab Results:  CBC    Component Value Date/Time   WBC 8.7 09/17/2019 1016   RBC 4.33 09/17/2019 1016   HGB 13.8 09/17/2019 1016   HCT 41.9 09/17/2019 1016   PLT 154.0 09/17/2019 1016   MCV 96.7 09/17/2019 1016   MCH 32.6 08/18/2019 0915   MCHC 33.0 09/17/2019 1016   RDW 18.5 (H) 09/17/2019 1016   LYMPHSABS 1.2 04/06/2015 1305   MONOABS 0.5 04/06/2015 1305   EOSABS 0.1 04/06/2015 1305   BASOSABS 0.0 04/06/2015 1305    BMET    Component Value Date/Time   NA 142 09/17/2019 1016   NA 142 03/30/2015 1149   K 4.0 09/17/2019 1016   K 4.5 03/30/2015 1149   CL 105 09/17/2019 1016   CO2 30 09/17/2019 1016   CO2 25 03/30/2015 1149   GLUCOSE 112 (H) 09/17/2019 1016   GLUCOSE 104 03/30/2015 1149   BUN 12 09/17/2019 1016   BUN 17.1 03/30/2015 1149   CREATININE 1.05 09/17/2019 1016   CREATININE 1.2 03/30/2015 1149   CALCIUM 8.4 09/17/2019 1016   CALCIUM 8.7 03/30/2015 1149   GFRNONAA >60 08/18/2019 0915   GFRAA >60 08/18/2019 0915    BNP No results found for: BNP  ProBNP No results found for: PROBNP  Imaging: MR HIP RIGHT WO CONTRAST  Result Date: 05/01/2020 CLINICAL DATA:   Chronic bilateral hip pain. History of thyroid cancer 5 years ago. EXAM: MR OF THE RIGHT HIP WITHOUT CONTRAST MR OF THE LEFT HIP WITHOUT CONTRAST TECHNIQUE: Multiplanar, multisequence MR imaging was performed of the right hip. No intravenous contrast was administered. Multiplanar, multisequence MR imaging was performed of the left hip. No intravenous contrast was administered. COMPARISON:  None. FINDINGS: Bones: No right hip fracture, dislocation or avascular necrosis. No left hip fracture, dislocation or avascular necrosis. No aggressive osseous lesion. No periosteal reaction or bone destruction. Normal sacrum and sacroiliac  joints. No SI joint widening or erosive changes. Articular cartilage and labrum Articular cartilage: Mild partial-thickness cartilage loss of the femoral head and acetabulum bilaterally. Labrum: Grossly intact, but evaluation is limited by lack of intraarticular fluid. Joint or bursal effusion Joint effusion:  No hip joint effusion.  No SI joint effusion. Bursae: No bursa formation. Muscles and tendons Flexors: Normal. Extensors: Normal. Abductors: Normal. Adductors: Normal. Gluteals: Minimal muscle edema in the superior peripheral most aspect of the right gluteus minimus muscle likely reflecting mild muscle strain. Mild muscle edema in the inferior peripheral aspect of the left gluteus maximus muscle likely reflecting mild muscle strain. Hamstrings: Normal. Other findings Miscellaneous: No pelvic free fluid. No fluid collection or hematoma. No inguinal lymphadenopathy. No inguinal hernia. IMPRESSION: 1. No hip fracture, dislocation or avascular necrosis of bilateral hips. 2. Minimal muscle edema in the superior peripheral most aspect of the right gluteus minimus muscle likely reflecting mild muscle strain. 3. Mild muscle edema in the inferior peripheral aspect of the left gluteus maximus muscle likely reflecting mild muscle strain. 4. Mild osteoarthritis of bilateral hips. Electronically  Signed   By: Kathreen Devoid   On: 05/01/2020 07:01   MR HIP LEFT WO CONTRAST  Result Date: 05/01/2020 CLINICAL DATA:  Chronic bilateral hip pain. History of thyroid cancer 5 years ago. EXAM: MR OF THE RIGHT HIP WITHOUT CONTRAST MR OF THE LEFT HIP WITHOUT CONTRAST TECHNIQUE: Multiplanar, multisequence MR imaging was performed of the right hip. No intravenous contrast was administered. Multiplanar, multisequence MR imaging was performed of the left hip. No intravenous contrast was administered. COMPARISON:  None. FINDINGS: Bones: No right hip fracture, dislocation or avascular necrosis. No left hip fracture, dislocation or avascular necrosis. No aggressive osseous lesion. No periosteal reaction or bone destruction. Normal sacrum and sacroiliac joints. No SI joint widening or erosive changes. Articular cartilage and labrum Articular cartilage: Mild partial-thickness cartilage loss of the femoral head and acetabulum bilaterally. Labrum: Grossly intact, but evaluation is limited by lack of intraarticular fluid. Joint or bursal effusion Joint effusion:  No hip joint effusion.  No SI joint effusion. Bursae: No bursa formation. Muscles and tendons Flexors: Normal. Extensors: Normal. Abductors: Normal. Adductors: Normal. Gluteals: Minimal muscle edema in the superior peripheral most aspect of the right gluteus minimus muscle likely reflecting mild muscle strain. Mild muscle edema in the inferior peripheral aspect of the left gluteus maximus muscle likely reflecting mild muscle strain. Hamstrings: Normal. Other findings Miscellaneous: No pelvic free fluid. No fluid collection or hematoma. No inguinal lymphadenopathy. No inguinal hernia. IMPRESSION: 1. No hip fracture, dislocation or avascular necrosis of bilateral hips. 2. Minimal muscle edema in the superior peripheral most aspect of the right gluteus minimus muscle likely reflecting mild muscle strain. 3. Mild muscle edema in the inferior peripheral aspect of the left  gluteus maximus muscle likely reflecting mild muscle strain. 4. Mild osteoarthritis of bilateral hips. Electronically Signed   By: Kathreen Devoid   On: 05/01/2020 07:01     Assessment & Plan:   COPD, moderate ([place]) - Reports dyspnea on exertion and cough. He appears intolerant to LAMA d/t blurred vision.  - Trial ICS/LABA due to positive BD response in FEV1 12%  - Given sample Breo 100 one puff daily (rinse mouth after use) - If he does not find inhaler beneficial, recommend he use SABA as needed for breakthrough shortness of breath - FU in 3-4 months with Dr. Candise Che, NP 05/24/2020

## 2020-05-24 ENCOUNTER — Ambulatory Visit (INDEPENDENT_AMBULATORY_CARE_PROVIDER_SITE_OTHER): Payer: Medicare Other | Admitting: Primary Care

## 2020-05-24 ENCOUNTER — Ambulatory Visit: Payer: Medicare Other | Admitting: Primary Care

## 2020-05-24 ENCOUNTER — Encounter: Payer: Self-pay | Admitting: Primary Care

## 2020-05-24 ENCOUNTER — Other Ambulatory Visit: Payer: Self-pay

## 2020-05-24 DIAGNOSIS — J449 Chronic obstructive pulmonary disease, unspecified: Secondary | ICD-10-CM

## 2020-05-24 MED ORDER — BREO ELLIPTA 100-25 MCG/INH IN AEPB: 1.0000 | INHALATION_SPRAY | Freq: Every day | RESPIRATORY_TRACT | 0 refills | Status: DC

## 2020-05-24 NOTE — Addendum Note (Signed)
Addended byAdalberto Cole M on: 05/24/2020 12:12 PM   Modules accepted: Orders

## 2020-05-24 NOTE — Patient Instructions (Addendum)
Pulmonary function testing showed moderate COPD   Recommendations: Trial Breo Ellipta 100- take one puff daily in the morning (rinse mouth after use) - If you find this inhaler beneficial please notify our office and we will send in prescription  - If you do not find inhaler beneficial recommend using Albuterol rescue inhaler 2 puffs every 4-6 hours for breakthrough shortness of breath/cough/wheezing   Follow-up: - 3-4 months with Dr. Elsworth Soho      Chronic Obstructive Pulmonary Disease Chronic obstructive pulmonary disease (COPD) is a long-term (chronic) lung problem. When you have COPD, it is hard for air to get in and out of your lungs. Usually the condition gets worse over time, and your lungs will never return to normal. There are things you can do to keep yourself as healthy as possible.  Your doctor may treat your condition with: ? Medicines. ? Oxygen. ? Lung surgery.  Your doctor may also recommend: ? Rehabilitation. This includes steps to make your body work better. It may involve a team of specialists. ? Quitting smoking, if you smoke. ? Exercise and changes to your diet. ? Comfort measures (palliative care). Follow these instructions at home: Medicines  Take over-the-counter and prescription medicines only as told by your doctor.  Talk to your doctor before taking any cough or allergy medicines. You may need to avoid medicines that cause your lungs to be dry. Lifestyle  If you smoke, stop. Smoking makes the problem worse. If you need help quitting, ask your doctor.  Avoid being around things that make your breathing worse. This may include smoke, chemicals, and fumes.  Stay active, but remember to rest as well.  Learn and use tips on how to relax.  Make sure you get enough sleep. Most adults need at least 7 hours of sleep every night.  Eat healthy foods. Eat smaller meals more often. Rest before meals. Controlled breathing Learn and use tips on how to control your  breathing as told by your doctor. Try:  Breathing in (inhaling) through your nose for 1 second. Then, pucker your lips and breath out (exhale) through your lips for 2 seconds.  Putting one hand on your belly (abdomen). Breathe in slowly through your nose for 1 second. Your hand on your belly should move out. Pucker your lips and breathe out slowly through your lips. Your hand on your belly should move in as you breathe out.  Controlled coughing Learn and use controlled coughing to clear mucus from your lungs. Follow these steps: 1. Lean your head a little forward. 2. Breathe in deeply. 3. Try to hold your breath for 3 seconds. 4. Keep your mouth slightly open while coughing 2 times. 5. Spit any mucus out into a tissue. 6. Rest and do the steps again 1 or 2 times as needed. General instructions  Make sure you get all the shots (vaccines) that your doctor recommends. Ask your doctor about a flu shot and a pneumonia shot.  Use oxygen therapy and pulmonary rehabilitation if told by your doctor. If you need home oxygen therapy, ask your doctor if you should buy a tool to measure your oxygen level (oximeter).  Make a COPD action plan with your doctor. This helps you to know what to do if you feel worse than usual.  Manage any other conditions you have as told by your doctor.  Avoid going outside when it is very hot, cold, or humid.  Avoid people who have a sickness you can catch (contagious).  Keep  all follow-up visits as told by your doctor. This is important. Contact a doctor if:  You cough up more mucus than usual.  There is a change in the color or thickness of the mucus.  It is harder to breathe than usual.  Your breathing is faster than usual.  You have trouble sleeping.  You need to use your medicines more often than usual.  You have trouble doing your normal activities such as getting dressed or walking around the house. Get help right away if:  You have shortness of  breath while resting.  You have shortness of breath that stops you from: ? Being able to talk. ? Doing normal activities.  Your chest hurts for longer than 5 minutes.  Your skin color is more blue than usual.  Your pulse oximeter shows that you have low oxygen for longer than 5 minutes.  You have a fever.  You feel too tired to breathe normally. Summary  Chronic obstructive pulmonary disease (COPD) is a long-term lung problem.  The way your lungs work will never return to normal. Usually the condition gets worse over time. There are things you can do to keep yourself as healthy as possible.  Take over-the-counter and prescription medicines only as told by your doctor.  If you smoke, stop. Smoking makes the problem worse. This information is not intended to replace advice given to you by your health care provider. Make sure you discuss any questions you have with your health care provider. Document Revised: 10/04/2017 Document Reviewed: 11/26/2016 Elsevier Patient Education  2020 [employer].

## 2020-05-24 NOTE — Assessment & Plan Note (Signed)
-   Reports dyspnea on exertion and cough. He appears intolerant to LAMA d/t blurred vision.  - Trial ICS/LABA due to positive BD response in FEV1 12%  - Given sample Breo 100 one puff daily (rinse mouth after use) - If he does not find inhaler beneficial, recommend he use SABA as needed for breakthrough shortness of breath - FU in 3-4 months with Dr. Elsworth Soho

## 2020-05-26 NOTE — Telephone Encounter (Signed)
Patient called, message left for him to call back to discuss PFT results.

## 2020-06-13 ENCOUNTER — Ambulatory Visit: Payer: Medicare Other | Admitting: Physical Therapy

## 2020-06-13 ENCOUNTER — Ambulatory Visit: Payer: Medicare Other | Attending: Orthopedic Surgery

## 2020-06-13 ENCOUNTER — Other Ambulatory Visit: Payer: Self-pay

## 2020-06-13 DIAGNOSIS — M25652 Stiffness of left hip, not elsewhere classified: Secondary | ICD-10-CM | POA: Diagnosis present

## 2020-06-13 DIAGNOSIS — M25552 Pain in left hip: Secondary | ICD-10-CM | POA: Diagnosis present

## 2020-06-13 DIAGNOSIS — M25551 Pain in right hip: Secondary | ICD-10-CM

## 2020-06-13 DIAGNOSIS — M25651 Stiffness of right hip, not elsewhere classified: Secondary | ICD-10-CM | POA: Insufficient documentation

## 2020-06-13 DIAGNOSIS — M6281 Muscle weakness (generalized): Secondary | ICD-10-CM

## 2020-06-13 NOTE — Therapy (Signed)
[facility] [place], [place], [address] Phone: [phone]   Fax:  [phone]  Physical Therapy Evaluation  Patient Details  Name: Victor [name] MRN: [id number] Date of Birth: [birth year] Referring Provider (PT): Frederik Pear, MD   Encounter Date: 06/13/2020   PT End of Session - 06/13/20 0932    Visit Number 1    Number of Visits 16    Date for PT Re-Evaluation 08/08/20    Authorization Type BCBS    PT Start Time 0845    PT Stop Time 0930    PT Time Calculation (min) 45 min    Activity Tolerance Patient tolerated treatment well    Behavior During Therapy [facility] for tasks assessed/performed           Past Medical History:  Diagnosis Date  . Arthritis   . Atherosclerosis   . Atherosclerotic PVD with intermittent claudication (HCC)    stent left leg  . BPH (benign prostatic hyperplasia)   . Bulging lumbar disc   . Cancer ([place]) 11/12/14   vocal cord  carcinoma in situ , radiation; thyroid cancer  . Carotid bruit   . Chronic kidney disease    chronic stage III  . COPD (chronic obstructive pulmonary disease) ([place])   . DDD (degenerative disc disease), lumbar   . Dysphonia   . Dysplasia of true vocal cord   . GERD (gastroesophageal reflux disease)   . H/O carotid atherosclerosis    b/l  . Hyperlipidemia   . Hypothyroidism   . Occasional tremors    left hand managed with propranolol  . Peripheral vascular angioplasty status with implants and grafts   . Precancerous lesion 03/05/2018   premelanoma removed from back.   . Radiation 03/10/15- 04/18/16   Larynx  . Sleep apnea    hasn't used CPAP in years  . Spondylosis of lumbosacral region   . Thyroid disease     Past Surgical History:  Procedure Laterality Date  . APPENDECTOMY    . COLONOSCOPY W/ POLYPECTOMY    . DIODE LASER APPLICATION Left 53/29/9242   Procedure: DIODE LASER APPLICATION;  Surgeon: Hayden Pedro, MD;  Location: [place];   Service: Ophthalmology;  Laterality: Left;  . MEMBRANE PEEL Left 08/18/2019   Procedure: MEMBRANE PEEL;  Surgeon: Hayden Pedro, MD;  Location: [place];  Service: Ophthalmology;  Laterality: Left;  . MOUTH SURGERY     tooth extraction  . PARS PLANA VITRECTOMY Left 08/18/2019   PARS PLANA VITRECTOMY 27 GAUGE (Left)  . PARS PLANA VITRECTOMY 27 GAUGE Left 08/18/2019   Procedure: PARS PLANA VITRECTOMY 27 GAUGE;  Surgeon: Hayden Pedro, MD;  Location: [place];  Service: Ophthalmology;  Laterality: Left;  . PHOTOCOAGULATION WITH LASER Left 08/18/2019   Procedure: PHOTOCOAGULATION WITH LASER;  Surgeon: Hayden Pedro, MD;  Location: [place];  Service: Ophthalmology;  Laterality: Left;  . THYROID SURGERY    . TONSILLECTOMY    . vocal cord biopsy  11/12/14   squamous cell carcinoma in situ    There were no vitals filed for this visit.    Subjective Assessment - 06/13/20 0857    Subjective Pt reports he got a "hip pointer" 6 months ago in his L hip that went away after 2-3 days ago. He has had L hip pain for 10-15 years, but it only hurts when he lies on his left side. He has osteoarthritis in both hips, R worse than L,  but he only has L hip pain.    Limitations Other (comment)   lying on his left side on any surface, firm worse   Diagnostic tests MR - mild hip OA B    Patient Stated Goals Would like to resolve pain    Currently in Pain? No/denies              [facility] PT Assessment - 06/13/20 0001      Assessment   Medical Diagnosis B Hip Pain    Referring Provider (PT) Frederik Pear, MD    Onset Date/Surgical Date 06/13/05    Hand Dominance Right    Next MD Visit PRN    Prior Therapy Not for hip      Balance Screen   Has the patient fallen in the past 6 months No    Has the patient had a decrease in activity level because of a fear of falling?  Yes    Is the patient reluctant to leave their home because of a fear of falling?  No      Prior Function   Level of Independence  Independent    Vocation Retired    Leisure Read, walking      ROM / Strength   AROM / PROM / Strength AROM;PROM;Strength      AROM   AROM Assessment Site Hip    Right/Left Hip Right;Left    Right Hip Flexion 105    Left Hip Flexion 105      PROM   PROM Assessment Site Hip    Right/Left Hip Right;Left    Right Hip Flexion 112    Right Hip External Rotation  56    Right Hip Internal Rotation  18   LBP   Left Hip Flexion 110    Left Hip External Rotation  61    Left Hip Internal Rotation  14      Strength   Strength Assessment Site Hip;Knee    Right/Left Hip Right;Left    Right Hip Flexion 4-/5    Right Hip External Rotation  4-/5    Right Hip Internal Rotation 4-/5    Left Hip Flexion 4-/5    Left Hip External Rotation 4-/5    Left Hip Internal Rotation 4-/5      Flexibility   Soft Tissue Assessment /Muscle Length yes    Hamstrings very tight    Quadriceps very tight    Piriformis very tight      Palpation   Palpation comment no pain upon palpation       Special Tests    Special Tests Hip Special Tests    Hip Special Tests  Saralyn Pilar (FABER) Test;Thomas Test;Hip Scouring      Saralyn Pilar [facility]) Test   Findings Positive    Side Left      Thomas Test    Findings Positive    Comments B      Hip Scouring   Findings Negative                      Objective measurements completed on examination: See above findings.               PT Education - 06/13/20 0904    Education Details FOTO, diagnosis, prognosis, HEP, POC    Person(s) Educated Patient    Methods Explanation;Handout;Demonstration;Tactile cues;Verbal cues    Comprehension Verbalized understanding;Returned demonstration            PT Short Term Goals -  06/13/20 0937      PT SHORT TERM GOAL #1   Title Pt will be independent and compliant with initial HEP.    Time 2    Period Weeks    Status New    Target Date 06/27/20             PT Long Term Goals - 06/13/20 0938       PT LONG TERM GOAL #1   Title Pt will increase B hip IR PROM to >25.    Baseline 14 L, 18 R    Time 8    Period Weeks    Status New    Target Date 08/08/20      PT LONG TERM GOAL #2   Title Pt will increase B SLS to 20 seconds or greater.    Time 8    Period Weeks    Status New    Target Date 08/08/20      PT LONG TERM GOAL #3   Title Pt will increase hip MMT to 4+/5 for increased stability around hips.    Time 8    Period Weeks    Status New    Target Date 08/08/20      PT LONG TERM GOAL #4   Title Pt will improve flexilbility of hamstrings to less than 40 degrees from 180 in 90/90 position.    Time 8    Period Weeks    Status New    Target Date 08/08/20                  Plan - 06/13/20 0939    Clinical Impression Statement Pt is an 84 yo male who presents to OP PT with long history of L hip pain that only bothers him with L S/L. He had a positive FABER test on L side significant restriction in B hip IR L>R. Pt demo poor balance L>R, decreased hip ROM, flexibility, and strength. Pt was educated on diagnosis, prognosis, POC, and HEP, as well as FOTO - verbalized understanding and consent to tx. Pt will benefit from skilled PT 1-2x/week for 6-8 weeks, in order to address impairments.    Personal Factors and Comorbidities Age;Comorbidity 1;Comorbidity 2;Comorbidity 3+    Comorbidities HLD, COPD, kidney dz    Examination-Activity Limitations Sleep    Examination-Participation Restrictions Other   sleeping on L side   Stability/Clinical Decision Making Stable/Uncomplicated    Clinical Decision Making Low    Rehab Potential Fair    PT Frequency 2x / week    PT Duration 4 weeks    PT Treatment/Interventions ADLs/Self Care Home Management;Iontophoresis 4mg /ml Dexamethasone;Moist Heat;Electrical Stimulation;Cryotherapy;Joint Manipulations;Dry needling;Manual techniques;Neuromuscular re-education;Therapeutic activities;Therapeutic exercise;Balance training;Passive range  of motion;Vasopneumatic Device    PT Next Visit Plan assess response to HEP, initiate hip strengthening    PT Home Exercise Plan see pt instructions    Consulted and Agree with Plan of Care Patient           Patient will benefit from skilled therapeutic intervention in order to improve the following deficits and impairments:  Decreased balance, Decreased mobility, Decreased strength, Postural dysfunction, Improper body mechanics, Impaired flexibility, Hypomobility, Decreased range of motion, Increased fascial restricitons  Visit Diagnosis: Pain in left hip - Plan: PT plan of care cert/re-cert  Pain in right hip - Plan: PT plan of care cert/re-cert  Stiffness of left hip, not elsewhere classified - Plan: PT plan of care cert/re-cert  Stiffness of right hip, not elsewhere classified - Plan:  PT plan of care cert/re-cert  Muscle weakness (generalized) - Plan: PT plan of care cert/re-cert     Problem List Patient Active Problem List   Diagnosis Date Noted  . COPD, moderate ([place]) 05/24/2020  . Bilateral hip pain 05/04/2020  . Plantar fasciitis of left foot 03/30/2020  . Chronic left SI joint pain 03/01/2020  . Shortness of breath 10/01/2019  . Former smoker 10/01/2019  . Healthcare maintenance 10/01/2019  . Epiretinal membrane (ERM) of left eye 07/22/2019  . Centrilobular emphysema ([place]) 07/02/2019  . Pharyngeal dysphagia 04/07/2019  . Cervical radiculopathy 03/16/2019  . 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 Lumbar radiculopathy 04/16/2017  . Rotator cuff syndrome of left shoulder 04/16/2017  . Trochanteric bursitis, left hip 04/16/2017  . Atherosclerosis of artery of both lower extremities ([place]) 11/14/2016  . Bilateral carotid artery stenosis 11/14/2016  . Vitamin D deficiency 07/31/2016  . Facet arthritis of lumbar region 09/26/2015  . 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  . History of thyroid cancer 03/26/2014  . Benign prostatic hyperplasia with urinary  obstruction 03/23/2014  . Dysphonia 03/17/2014  . Gastro-esophageal reflux disease without esophagitis 03/17/2014  . Hyperlipidemia, unspecified 03/17/2014  . Other intervertebral disc degeneration, lumbar region 03/17/2014  . PAD (peripheral artery disease) ([place]) 03/17/2014  . Postoperative hypothyroidism 03/17/2014  . Chronic kidney disease, stage 2 (mild) 03/17/2014  . Occasional tremors 03/17/2014    Izell Stratford, PT, DPT 06/13/2020, 12:04 PM  [place], [place], [address] Phone: [phone]   Fax:  [phone]  Name: Victor [name] MRN: [id number] Date of Birth: Nov 09, 1935

## 2020-06-14 ENCOUNTER — Other Ambulatory Visit: Payer: Self-pay

## 2020-06-14 ENCOUNTER — Encounter: Payer: Self-pay | Admitting: Family Medicine

## 2020-06-14 ENCOUNTER — Ambulatory Visit (INDEPENDENT_AMBULATORY_CARE_PROVIDER_SITE_OTHER): Payer: Medicare Other | Admitting: Family Medicine

## 2020-06-14 DIAGNOSIS — M722 Plantar fascial fibromatosis: Secondary | ICD-10-CM

## 2020-06-14 DIAGNOSIS — M7062 Trochanteric bursitis, left hip: Secondary | ICD-10-CM | POA: Diagnosis not present

## 2020-06-14 NOTE — Assessment & Plan Note (Signed)
Symptoms seem to be more associated with the lateral aspect. Seems more hip abduction and greater trochanteric related. Currently going through physical therapy. -Counseled on supportive care. -Could consider a greater trochanteric bursa injection versus a intra-articular injection.

## 2020-06-14 NOTE — Patient Instructions (Signed)
Good to see you Please try the rub on medicine  You could consider trying a night splint on your foot   Please send me a message in MyChart with any questions or updates.  Please see me back in 6 weeks.   --Dr. [name]

## 2020-06-14 NOTE — Progress Notes (Signed)
Victor [name] - 84 y.o. male MRN [id number]  Date of birth: 1936/01/12  SUBJECTIVE:  Including CC & ROS.  Chief Complaint  Patient presents with  . Follow-up    bilateral hip    Victor [name] is a 84 y.o. male that is following up for his left foot pain and his left hip pain. He has been sent to physical therapy for his left hip. His left foot hurts halfway through a 2 mile walk. Denies any pain with short walks or at rest.   Review of Systems See HPI   HISTORY: Past Medical, Surgical, Social, and Family History Reviewed & Updated per EMR.   Pertinent Historical Findings include:  Past Medical History:  Diagnosis Date  . Arthritis   . Atherosclerosis   . Atherosclerotic PVD with intermittent claudication (HCC)    stent left leg  . BPH (benign prostatic hyperplasia)   . Bulging lumbar disc   . Cancer ([place]) 11/12/14   vocal cord  carcinoma in situ , radiation; thyroid cancer  . Carotid bruit   . Chronic kidney disease    chronic stage III  . COPD (chronic obstructive pulmonary disease) ([place])   . DDD (degenerative disc disease), lumbar   . Dysphonia   . Dysplasia of true vocal cord   . GERD (gastroesophageal reflux disease)   . H/O carotid atherosclerosis    b/l  . Hyperlipidemia   . Hypothyroidism   . Occasional tremors    left hand managed with propranolol  . Peripheral vascular angioplasty status with implants and grafts   . Precancerous lesion 03/05/2018   premelanoma removed from back.   . Radiation 03/10/15- 04/18/16   Larynx  . Sleep apnea    hasn't used CPAP in years  . Spondylosis of lumbosacral region   . Thyroid disease     Past Surgical History:  Procedure Laterality Date  . APPENDECTOMY    . COLONOSCOPY W/ POLYPECTOMY    . DIODE LASER APPLICATION Left 98/92/1194   Procedure: DIODE LASER APPLICATION;  Surgeon: Hayden Pedro, MD;  Location: [place];  Service: Ophthalmology;  Laterality: Left;  . MEMBRANE PEEL Left 08/18/2019   Procedure: MEMBRANE  PEEL;  Surgeon: Hayden Pedro, MD;  Location: [place];  Service: Ophthalmology;  Laterality: Left;  . MOUTH SURGERY     tooth extraction  . PARS PLANA VITRECTOMY Left 08/18/2019   PARS PLANA VITRECTOMY 27 GAUGE (Left)  . PARS PLANA VITRECTOMY 27 GAUGE Left 08/18/2019   Procedure: PARS PLANA VITRECTOMY 27 GAUGE;  Surgeon: Hayden Pedro, MD;  Location: [place];  Service: Ophthalmology;  Laterality: Left;  . PHOTOCOAGULATION WITH LASER Left 08/18/2019   Procedure: PHOTOCOAGULATION WITH LASER;  Surgeon: Hayden Pedro, MD;  Location: [place];  Service: Ophthalmology;  Laterality: Left;  . THYROID SURGERY    . TONSILLECTOMY    . vocal cord biopsy  11/12/14   squamous cell carcinoma in situ    No family history on file.  Social History   Socioeconomic History  . Marital status: Married    Spouse name: Not on file  . Number of children: Not on file  . Years of education: Not on file  . Highest education level: Not on file  Occupational History  . Not on file  Tobacco Use  . Smoking status: Former Smoker    Packs/day: 2.00    Years: 51.00    Pack years: 102.00    Types: Cigarettes    Start date:  1954    Quit date: 03/05/2004    Years since quitting: 16.2  . Smokeless tobacco: Never Used  Vaping Use  . Vaping Use: Never used  Substance and Sexual Activity  . Alcohol use: Yes    Alcohol/week: 7.0 standard drinks    Types: 7 Standard drinks or equivalent per week  . Drug use: No  . Sexual activity: Not on file  Other Topics Concern  . Not on file  Social History Narrative  . Not on file   Social Determinants of Health   Financial Resource Strain:   . Difficulty of Paying Living Expenses:   Food Insecurity:   . Worried About Charity fundraiser in the Last Year:   . Arboriculturist in the Last Year:   Transportation Needs:   . Film/video editor (Medical):   Marland Kitchen Lack of Transportation (Non-Medical):   Physical Activity:   . Days of Exercise per Week:   . Minutes of  Exercise per Session:   Stress:   . Feeling of Stress :   Social Connections:   . Frequency of Communication with Friends and Family:   . Frequency of Social Gatherings with Friends and Family:   . Attends Religious Services:   . Active Member of Clubs or Organizations:   . Attends Archivist Meetings:   Marland Kitchen Marital Status:   Intimate Partner Violence:   . Fear of Current or Ex-Partner:   . Emotionally Abused:   Marland Kitchen Physically Abused:   . Sexually Abused:      PHYSICAL EXAM:  VS: BP (!) 144/71   Pulse 66   Ht 5\' 10"  (1.778 m)   Wt 220 lb (99.8 kg)   BMI 31.57 kg/m  Physical Exam Gen: NAD, alert, cooperative with exam, well-appearing  ASSESSMENT & PLAN:   Trochanteric bursitis, left hip Symptoms seem to be more associated with the lateral aspect. Seems more hip abduction and greater trochanteric related. Currently going through physical therapy. -Counseled on supportive care. -Could consider a greater trochanteric bursa injection versus a intra-articular injection.  Plantar fasciitis of left foot Pain is still occurring however improved from previous injection. -Counseled on night splint. -Counseled supportive care. -Provided Pennsaid samples. -Could consider injection. -Could consider custom orthotics. He tends to supinate so could consider lateral wedge.

## 2020-06-14 NOTE — Assessment & Plan Note (Signed)
Pain is still occurring however improved from previous injection. -Counseled on night splint. -Counseled supportive care. -Provided Pennsaid samples. -Could consider injection. -Could consider custom orthotics. He tends to supinate so could consider lateral wedge.

## 2020-06-14 NOTE — Progress Notes (Signed)
Medication Samples have been provided to the patient.  Drug name: Pennsaid       Strength: 2%        Qty: 2 boxes  LOT: [id number]  Exp.Date: 12/2020  Dosing instructions: use a pea size amount and rub gently.  The patient has been instructed regarding the correct time, dose, and frequency of taking this medication, including desired effects and most common side effects.   Sherrie George, MA 2:56 PM 06/14/2020

## 2020-06-21 ENCOUNTER — Other Ambulatory Visit: Payer: Self-pay

## 2020-06-21 ENCOUNTER — Encounter: Payer: Self-pay | Admitting: Physical Therapy

## 2020-06-21 ENCOUNTER — Ambulatory Visit: Payer: Medicare Other | Admitting: Physical Therapy

## 2020-06-21 DIAGNOSIS — M25552 Pain in left hip: Secondary | ICD-10-CM

## 2020-06-21 DIAGNOSIS — M25651 Stiffness of right hip, not elsewhere classified: Secondary | ICD-10-CM

## 2020-06-21 DIAGNOSIS — M25551 Pain in right hip: Secondary | ICD-10-CM

## 2020-06-21 DIAGNOSIS — M25652 Stiffness of left hip, not elsewhere classified: Secondary | ICD-10-CM

## 2020-06-21 DIAGNOSIS — M6281 Muscle weakness (generalized): Secondary | ICD-10-CM

## 2020-06-21 NOTE — Therapy (Signed)
[address] [place], [place], [address] Phone: [phone]   Fax:  [phone]  Physical Therapy Treatment  Patient Details  Name: Victor [name] MRN: [id number] Date of Birth: 12/25/1935 Referring Provider (PT): Frederik Pear, MD   Encounter Date: 06/21/2020   PT End of Session - 06/21/20 1010    Visit Number 2    Number of Visits 16    Date for PT Re-Evaluation 08/08/20    Authorization Type BCBS    PT Start Time 0930    PT Stop Time 1012    PT Time Calculation (min) 42 min    Activity Tolerance Patient tolerated treatment well    Behavior During Therapy [facility] for tasks assessed/performed           Past Medical History:  Diagnosis Date  . Arthritis   . Atherosclerosis   . Atherosclerotic PVD with intermittent claudication (HCC)    stent left leg  . BPH (benign prostatic hyperplasia)   . Bulging lumbar disc   . Cancer ([place]) 11/12/14   vocal cord  carcinoma in situ , radiation; thyroid cancer  . Carotid bruit   . Chronic kidney disease    chronic stage III  . COPD (chronic obstructive pulmonary disease) ([place])   . DDD (degenerative disc disease), lumbar   . Dysphonia   . Dysplasia of true vocal cord   . GERD (gastroesophageal reflux disease)   . H/O carotid atherosclerosis    b/l  . Hyperlipidemia   . Hypothyroidism   . Occasional tremors    left hand managed with propranolol  . Peripheral vascular angioplasty status with implants and grafts   . Precancerous lesion 03/05/2018   premelanoma removed from back.   . Radiation 03/10/15- 04/18/16   Larynx  . Sleep apnea    hasn't used CPAP in years  . Spondylosis of lumbosacral region   . Thyroid disease     Past Surgical History:  Procedure Laterality Date  . APPENDECTOMY    . COLONOSCOPY W/ POLYPECTOMY    . DIODE LASER APPLICATION Left 51/76/1607   Procedure: DIODE LASER APPLICATION;  Surgeon: Hayden Pedro, MD;  Location: [place];   Service: Ophthalmology;  Laterality: Left;  . MEMBRANE PEEL Left 08/18/2019   Procedure: MEMBRANE PEEL;  Surgeon: Hayden Pedro, MD;  Location: [place];  Service: Ophthalmology;  Laterality: Left;  . MOUTH SURGERY     tooth extraction  . PARS PLANA VITRECTOMY Left 08/18/2019   PARS PLANA VITRECTOMY 27 GAUGE (Left)  . PARS PLANA VITRECTOMY 27 GAUGE Left 08/18/2019   Procedure: PARS PLANA VITRECTOMY 27 GAUGE;  Surgeon: Hayden Pedro, MD;  Location: [place];  Service: Ophthalmology;  Laterality: Left;  . PHOTOCOAGULATION WITH LASER Left 08/18/2019   Procedure: PHOTOCOAGULATION WITH LASER;  Surgeon: Hayden Pedro, MD;  Location: [place];  Service: Ophthalmology;  Laterality: Left;  . THYROID SURGERY    . TONSILLECTOMY    . vocal cord biopsy  11/12/14   squamous cell carcinoma in situ    There were no vitals filed for this visit.   Subjective Assessment - 06/21/20 0929    Subjective Been doing the exercises no problems, only hurts when I lie on the left hip    Currently in Pain? No/denies                             [facility] Adult  PT Treatment/Exercise - 06/21/20 0001      Exercises   Exercises Knee/Hip      Knee/Hip Exercises: Stretches   Passive Hamstring Stretch Both;4 reps;20 seconds    ITB Stretch Both;4 reps;20 seconds    Piriformis Stretch Both;4 reps;20 seconds      Knee/Hip Exercises: Aerobic   Recumbent Bike 6 minutes      Knee/Hip Exercises: Supine   Other Supine Knee/Hip Exercises K2C, trunk rotaiton, small bridges and isometric abs      Modalities   Modalities Iontophoresis      Iontophoresis   Type of Iontophoresis Dexamethasone    Location left GT area    Dose 28mA dose    Time 4 hour patch                    PT Short Term Goals - 06/21/20 1011      PT SHORT TERM GOAL #1   Title Pt will be independent and compliant with initial HEP.    Status Achieved             PT Long Term Goals - 06/13/20 9937      PT LONG TERM  GOAL #1   Title Pt will increase B hip IR PROM to >25.    Baseline 14 L, 18 R    Time 8    Period Weeks    Status New    Target Date 08/08/20      PT LONG TERM GOAL #2   Title Pt will increase B SLS to 20 seconds or greater.    Time 8    Period Weeks    Status New    Target Date 08/08/20      PT LONG TERM GOAL #3   Title Pt will increase hip MMT to 4+/5 for increased stability around hips.    Time 8    Period Weeks    Status New    Target Date 08/08/20      PT LONG TERM GOAL #4   Title Pt will improve flexilbility of hamstrings to less than 40 degrees from 180 in 90/90 position.    Time 8    Period Weeks    Status New    Target Date 08/08/20                 Plan - 06/21/20 1010    Clinical Impression Statement Patient is very tight in the LE's, especially ITB, tolerated the progression to some exercises but struggled with some.  Added Ionto today    PT Next Visit Plan continue to add exercises for flexibility and strength and continue with ionto    Consulted and Agree with Plan of Care Patient           Patient will benefit from skilled therapeutic intervention in order to improve the following deficits and impairments:  Decreased balance, Decreased mobility, Decreased strength, Postural dysfunction, Improper body mechanics, Impaired flexibility, Hypomobility, Decreased range of motion, Increased fascial restricitons  Visit Diagnosis: Pain in left hip  Pain in right hip  Stiffness of left hip, not elsewhere classified  Stiffness of right hip, not elsewhere classified  Muscle weakness (generalized)     Problem List Patient Active Problem List   Diagnosis Date Noted  . COPD, moderate ([place]) 05/24/2020  . Bilateral hip pain 05/04/2020  . Plantar fasciitis of left foot 03/30/2020  . Chronic left SI joint pain 03/01/2020  . Shortness of breath 10/01/2019  . Former  smoker 10/01/2019  . Healthcare maintenance 10/01/2019  . Epiretinal membrane (ERM)  of left eye 07/22/2019  . Centrilobular emphysema ([place]) 07/02/2019  . Pharyngeal dysphagia 04/07/2019  . Cervical radiculopathy 03/16/2019  . 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 Lumbar radiculopathy 04/16/2017  . Rotator cuff syndrome of left shoulder 04/16/2017  . Trochanteric bursitis, left hip 04/16/2017  . Atherosclerosis of artery of both lower extremities ([place]) 11/14/2016  . Bilateral carotid artery stenosis 11/14/2016  . Vitamin D deficiency 07/31/2016  . Facet arthritis of lumbar region 09/26/2015  . 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  . History of thyroid cancer 03/26/2014  . Benign prostatic hyperplasia with urinary obstruction 03/23/2014  . Dysphonia 03/17/2014  . Gastro-esophageal reflux disease without esophagitis 03/17/2014  . Hyperlipidemia, unspecified 03/17/2014  . Other intervertebral disc degeneration, lumbar region 03/17/2014  . PAD (peripheral artery disease) ([place]) 03/17/2014  . Postoperative hypothyroidism 03/17/2014  . Chronic kidney disease, stage 2 (mild) 03/17/2014  . Occasional tremors 03/17/2014    Sumner Boast., PT 06/21/2020, 10:12 AM  [facility] [address] [place], [place], [address] Phone: [phone]   Fax:  [phone]  Name: Victor [name] MRN: [id number] Date of Birth: 12-04-1935

## 2020-06-24 ENCOUNTER — Other Ambulatory Visit: Payer: Self-pay

## 2020-06-24 ENCOUNTER — Encounter: Payer: Self-pay | Admitting: Physical Therapy

## 2020-06-24 ENCOUNTER — Ambulatory Visit: Payer: Medicare Other | Admitting: Physical Therapy

## 2020-06-24 DIAGNOSIS — M6281 Muscle weakness (generalized): Secondary | ICD-10-CM

## 2020-06-24 DIAGNOSIS — M25551 Pain in right hip: Secondary | ICD-10-CM

## 2020-06-24 DIAGNOSIS — M25552 Pain in left hip: Secondary | ICD-10-CM

## 2020-06-24 DIAGNOSIS — M25652 Stiffness of left hip, not elsewhere classified: Secondary | ICD-10-CM

## 2020-06-24 DIAGNOSIS — M25651 Stiffness of right hip, not elsewhere classified: Secondary | ICD-10-CM

## 2020-06-24 NOTE — Therapy (Signed)
[place] [address] [place], [place], [address] Phone: [phone]   Fax:  [phone]  Physical Therapy Treatment  Patient Details  Name: Victor [name] MRN: [id number] Date of Birth: Jun 20, 1936 Referring Provider (PT): Frederik Pear, MD   Encounter Date: 06/24/2020   PT End of Session - 06/24/20 0924    Visit Number 3    Date for PT Re-Evaluation 08/08/20    Authorization Type BCBS    PT Start Time 0840    PT Stop Time 0923    PT Time Calculation (min) 43 min    Activity Tolerance Patient tolerated treatment well    Behavior During Therapy [facility] for tasks assessed/performed           Past Medical History:  Diagnosis Date  . Arthritis   . Atherosclerosis   . Atherosclerotic PVD with intermittent claudication (HCC)    stent left leg  . BPH (benign prostatic hyperplasia)   . Bulging lumbar disc   . Cancer ([place]) 11/12/14   vocal cord  carcinoma in situ , radiation; thyroid cancer  . Carotid bruit   . Chronic kidney disease    chronic stage III  . COPD (chronic obstructive pulmonary disease) ([place])   . DDD (degenerative disc disease), lumbar   . Dysphonia   . Dysplasia of true vocal cord   . GERD (gastroesophageal reflux disease)   . H/O carotid atherosclerosis    b/l  . Hyperlipidemia   . Hypothyroidism   . Occasional tremors    left hand managed with propranolol  . Peripheral vascular angioplasty status with implants and grafts   . Precancerous lesion 03/05/2018   premelanoma removed from back.   . Radiation 03/10/15- 04/18/16   Larynx  . Sleep apnea    hasn't used CPAP in years  . Spondylosis of lumbosacral region   . Thyroid disease     Past Surgical History:  Procedure Laterality Date  . APPENDECTOMY    . COLONOSCOPY W/ POLYPECTOMY    . DIODE LASER APPLICATION Left 63/87/5643   Procedure: DIODE LASER APPLICATION;  Surgeon: Hayden Pedro, MD;  Location: [place];  Service: Ophthalmology;   Laterality: Left;  . MEMBRANE PEEL Left 08/18/2019   Procedure: MEMBRANE PEEL;  Surgeon: Hayden Pedro, MD;  Location: [place];  Service: Ophthalmology;  Laterality: Left;  . MOUTH SURGERY     tooth extraction  . PARS PLANA VITRECTOMY Left 08/18/2019   PARS PLANA VITRECTOMY 27 GAUGE (Left)  . PARS PLANA VITRECTOMY 27 GAUGE Left 08/18/2019   Procedure: PARS PLANA VITRECTOMY 27 GAUGE;  Surgeon: Hayden Pedro, MD;  Location: [place];  Service: Ophthalmology;  Laterality: Left;  . PHOTOCOAGULATION WITH LASER Left 08/18/2019   Procedure: PHOTOCOAGULATION WITH LASER;  Surgeon: Hayden Pedro, MD;  Location: [place];  Service: Ophthalmology;  Laterality: Left;  . THYROID SURGERY    . TONSILLECTOMY    . vocal cord biopsy  11/12/14   squamous cell carcinoma in situ    There were no vitals filed for this visit.   Subjective Assessment - 06/24/20 0843    Subjective Feeling okay today    Currently in Pain? No/denies                             [facility] Adult PT Treatment/Exercise - 06/24/20 0001      Knee/Hip Exercises: Stretches   Passive Hamstring Stretch Both;4  reps;20 seconds    ITB Stretch Both;4 reps;20 seconds    Piriformis Stretch Both;4 reps;20 seconds    Other Knee/Hip Stretches IR stretch of the left hip      Knee/Hip Exercises: Aerobic   Recumbent Bike 6 minutes      Knee/Hip Exercises: Machines for Strengthening   Cybex Knee Extension 5# 2x10    Cybex Knee Flexion 35# 2x10      Knee/Hip Exercises: Standing   Hip Abduction Both;2 sets;10 reps    Abduction Limitations 2.5#    Hip Extension Both;2 sets;10 reps    Extension Limitations 2.5#      Knee/Hip Exercises: Seated   Ball Squeeze 2x10      Knee/Hip Exercises: Supine   Other Supine Knee/Hip Exercises K2C, trunk rotaiton, small bridges and isometric abs      Iontophoresis   Type of Iontophoresis Dexamethasone    Location left GT area    Dose 45mA dose    Time 4 hour patch #2                     PT Short Term Goals - 06/21/20 1011      PT SHORT TERM GOAL #1   Title Pt will be independent and compliant with initial HEP.    Status Achieved             PT Long Term Goals - 06/13/20 5638      PT LONG TERM GOAL #1   Title Pt will increase B hip IR PROM to >25.    Baseline 14 L, 18 R    Time 8    Period Weeks    Status New    Target Date 08/08/20      PT LONG TERM GOAL #2   Title Pt will increase B SLS to 20 seconds or greater.    Time 8    Period Weeks    Status New    Target Date 08/08/20      PT LONG TERM GOAL #3   Title Pt will increase hip MMT to 4+/5 for increased stability around hips.    Time 8    Period Weeks    Status New    Target Date 08/08/20      PT LONG TERM GOAL #4   Title Pt will improve flexilbility of hamstrings to less than 40 degrees from 180 in 90/90 position.    Time 8    Period Weeks    Status New    Target Date 08/08/20                 Plan - 06/24/20 0925    Clinical Impression Statement Patient did very well with the addition of strength, some cues needed, he tends to externall rotate the hips, he is very tight with the IR stretch of the hip    PT Next Visit Plan continue to add exercises for flexibility and strength and continue with ionto    Consulted and Agree with Plan of Care Patient           Patient will benefit from skilled therapeutic intervention in order to improve the following deficits and impairments:  Decreased balance, Decreased mobility, Decreased strength, Postural dysfunction, Improper body mechanics, Impaired flexibility, Hypomobility, Decreased range of motion, Increased fascial restricitons  Visit Diagnosis: Pain in left hip  Pain in right hip  Stiffness of left hip, not elsewhere classified  Stiffness of right hip, not elsewhere classified  Muscle  weakness (generalized)     Problem List Patient Active Problem List   Diagnosis Date Noted  . COPD, moderate ([place])  05/24/2020  . Bilateral hip pain 05/04/2020  . Plantar fasciitis of left foot 03/30/2020  . Chronic left SI joint pain 03/01/2020  . Shortness of breath 10/01/2019  . Former smoker 10/01/2019  . Healthcare maintenance 10/01/2019  . Epiretinal membrane (ERM) of left eye 07/22/2019  . Centrilobular emphysema ([place]) 07/02/2019  . Pharyngeal dysphagia 04/07/2019  . Cervical radiculopathy 03/16/2019  . 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 Lumbar radiculopathy 04/16/2017  . Rotator cuff syndrome of left shoulder 04/16/2017  . Trochanteric bursitis, left hip 04/16/2017  . Atherosclerosis of artery of both lower extremities ([place]) 11/14/2016  . Bilateral carotid artery stenosis 11/14/2016  . Vitamin D deficiency 07/31/2016  . Facet arthritis of lumbar region 09/26/2015  . 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  . History of thyroid cancer 03/26/2014  . Benign prostatic hyperplasia with urinary obstruction 03/23/2014  . Dysphonia 03/17/2014  . Gastro-esophageal reflux disease without esophagitis 03/17/2014  . Hyperlipidemia, unspecified 03/17/2014  . Other intervertebral disc degeneration, lumbar region 03/17/2014  . PAD (peripheral artery disease) ([place]) 03/17/2014  . Postoperative hypothyroidism 03/17/2014  . Chronic kidney disease, stage 2 (mild) 03/17/2014  . Occasional tremors 03/17/2014    Sumner Boast., PT 06/24/2020, 9:26 AM  [facility] [address] [place], [place], [address] Phone: [phone]   Fax:  [phone]  Name: Victor [name] MRN: [id number] Date of Birth: 09/16/1936

## 2020-06-27 ENCOUNTER — Other Ambulatory Visit: Payer: Self-pay

## 2020-06-27 ENCOUNTER — Ambulatory Visit: Payer: Medicare Other | Admitting: Physical Therapy

## 2020-06-27 ENCOUNTER — Encounter: Payer: Self-pay | Admitting: Physical Therapy

## 2020-06-27 DIAGNOSIS — M25552 Pain in left hip: Secondary | ICD-10-CM | POA: Diagnosis not present

## 2020-06-27 DIAGNOSIS — M25551 Pain in right hip: Secondary | ICD-10-CM

## 2020-06-27 DIAGNOSIS — M6281 Muscle weakness (generalized): Secondary | ICD-10-CM

## 2020-06-27 DIAGNOSIS — M25651 Stiffness of right hip, not elsewhere classified: Secondary | ICD-10-CM

## 2020-06-27 DIAGNOSIS — M25652 Stiffness of left hip, not elsewhere classified: Secondary | ICD-10-CM

## 2020-06-27 NOTE — Therapy (Signed)
[place], [place], [address] Phone: [phone]   Fax:  [phone]  Physical Therapy Treatment  Patient Details  Name: Victor [name] MRN: [id number] Date of Birth: [birth year] Referring Provider (PT): Frederik Pear, MD   Encounter Date: 06/27/2020   PT End of Session - 06/27/20 0932    Visit Number 4    Number of Visits 16    Date for PT Re-Evaluation 08/08/20    PT Start Time 0846    PT Stop Time 0932    PT Time Calculation (min) 46 min    Activity Tolerance Patient tolerated treatment well    Behavior During Therapy [facility] for tasks assessed/performed           Past Medical History:  Diagnosis Date  . Arthritis   . Atherosclerosis   . Atherosclerotic PVD with intermittent claudication (HCC)    stent left leg  . BPH (benign prostatic hyperplasia)   . Bulging lumbar disc   . Cancer ([place]) 11/12/14   vocal cord  carcinoma in situ , radiation; thyroid cancer  . Carotid bruit   . Chronic kidney disease    chronic stage III  . COPD (chronic obstructive pulmonary disease) ([place])   . DDD (degenerative disc disease), lumbar   . Dysphonia   . Dysplasia of true vocal cord   . GERD (gastroesophageal reflux disease)   . H/O carotid atherosclerosis    b/l  . Hyperlipidemia   . Hypothyroidism   . Occasional tremors    left hand managed with propranolol  . Peripheral vascular angioplasty status with implants and grafts   . Precancerous lesion 03/05/2018   premelanoma removed from back.   . Radiation 03/10/15- 04/18/16   Larynx  . Sleep apnea    hasn't used CPAP in years  . Spondylosis of lumbosacral region   . Thyroid disease     Past Surgical History:  Procedure Laterality Date  . APPENDECTOMY    . COLONOSCOPY W/ POLYPECTOMY    . DIODE LASER APPLICATION Left 75/64/3329   Procedure: DIODE LASER APPLICATION;  Surgeon: Hayden Pedro, MD;  Location: [place];  Service: Ophthalmology;   Laterality: Left;  . MEMBRANE PEEL Left 08/18/2019   Procedure: MEMBRANE PEEL;  Surgeon: Hayden Pedro, MD;  Location: [place];  Service: Ophthalmology;  Laterality: Left;  . MOUTH SURGERY     tooth extraction  . PARS PLANA VITRECTOMY Left 08/18/2019   PARS PLANA VITRECTOMY 27 GAUGE (Left)  . PARS PLANA VITRECTOMY 27 GAUGE Left 08/18/2019   Procedure: PARS PLANA VITRECTOMY 27 GAUGE;  Surgeon: Hayden Pedro, MD;  Location: [place];  Service: Ophthalmology;  Laterality: Left;  . PHOTOCOAGULATION WITH LASER Left 08/18/2019   Procedure: PHOTOCOAGULATION WITH LASER;  Surgeon: Hayden Pedro, MD;  Location: [place];  Service: Ophthalmology;  Laterality: Left;  . THYROID SURGERY    . TONSILLECTOMY    . vocal cord biopsy  11/12/14   squamous cell carcinoma in situ    There were no vitals filed for this visit.   Subjective Assessment - 06/27/20 0848    Subjective About the same    Currently in Pain? No/denies                             [facility] Adult PT Treatment/Exercise - 06/27/20 0001      Knee/Hip Exercises: Stretches   Passive Hamstring Stretch  Both;4 reps;20 seconds    ITB Stretch Both;4 reps;20 seconds    Piriformis Stretch Both;4 reps;20 seconds    Other Knee/Hip Stretches IR stretch of the left hip      Knee/Hip Exercises: Aerobic   Nustep level 5 x 6 minutes      Knee/Hip Exercises: Machines for Strengthening   Cybex Knee Extension 15# 2x10    Cybex Knee Flexion 35# 2x10      Knee/Hip Exercises: Standing   Heel Raises Both;2 sets;10 reps    Heel Raises Limitations 2.5#    Hip Flexion Both;2 sets;10 reps    Hip Flexion Limitations 2.5#    Hip Abduction Both;2 sets;10 reps    Abduction Limitations 2.5#    Hip Extension Both;2 sets;10 reps    Extension Limitations 2.5#      Knee/Hip Exercises: Seated   Ball Squeeze 2x10      Iontophoresis   Type of Iontophoresis Dexamethasone    Location left GT area    Dose 36mA dose    Time 4 hour patch #2                     PT Short Term Goals - 06/21/20 1011      PT SHORT TERM GOAL #1   Title Pt will be independent and compliant with initial HEP.    Status Achieved             PT Long Term Goals - 06/13/20 3295      PT LONG TERM GOAL #1   Title Pt will increase B hip IR PROM to >25.    Baseline 14 L, 18 R    Time 8    Period Weeks    Status New    Target Date 08/08/20      PT LONG TERM GOAL #2   Title Pt will increase B SLS to 20 seconds or greater.    Time 8    Period Weeks    Status New    Target Date 08/08/20      PT LONG TERM GOAL #3   Title Pt will increase hip MMT to 4+/5 for increased stability around hips.    Time 8    Period Weeks    Status New    Target Date 08/08/20      PT LONG TERM GOAL #4   Title Pt will improve flexilbility of hamstrings to less than 40 degrees from 180 in 90/90 position.    Time 8    Period Weeks    Status New    Target Date 08/08/20                 Plan - 06/27/20 0933    Clinical Impression Statement Pt tolerated exercise progression well. Required rest breaks with standing ex's d/t reports of increased LBP with prolonged standing. Very tight with hip flexibility stretching, particularly L IR.    PT Treatment/Interventions ADLs/Self Care Home Management;Iontophoresis 4mg /ml Dexamethasone;Moist Heat;Electrical Stimulation;Cryotherapy;Joint Manipulations;Dry needling;Manual techniques;Neuromuscular re-education;Therapeutic activities;Therapeutic exercise;Balance training;Passive range of motion;Vasopneumatic Device    PT Next Visit Plan continue to add exercises for flexibility and strength and continue with ionto    Consulted and Agree with Plan of Care Patient           Patient will benefit from skilled therapeutic intervention in order to improve the following deficits and impairments:  Decreased balance, Decreased mobility, Decreased strength, Postural dysfunction, Improper body mechanics, Impaired  flexibility, Hypomobility, Decreased range of  motion, Increased fascial restricitons  Visit Diagnosis: Pain in left hip  Pain in right hip  Stiffness of left hip, not elsewhere classified  Stiffness of right hip, not elsewhere classified  Muscle weakness (generalized)     Problem List Patient Active Problem List   Diagnosis Date Noted  . COPD, moderate ([place]) 05/24/2020  . Bilateral hip pain 05/04/2020  . Plantar fasciitis of left foot 03/30/2020  . Chronic left SI joint pain 03/01/2020  . Shortness of breath 10/01/2019  . Former smoker 10/01/2019  . Healthcare maintenance 10/01/2019  . Epiretinal membrane (ERM) of left eye 07/22/2019  . Centrilobular emphysema ([place]) 07/02/2019  . Pharyngeal dysphagia 04/07/2019  . Cervical radiculopathy 03/16/2019  . 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 Lumbar radiculopathy 04/16/2017  . Rotator cuff syndrome of left shoulder 04/16/2017  . Trochanteric bursitis, left hip 04/16/2017  . Atherosclerosis of artery of both lower extremities ([place]) 11/14/2016  . Bilateral carotid artery stenosis 11/14/2016  . Vitamin D deficiency 07/31/2016  . Facet arthritis of lumbar region 09/26/2015  . 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  . History of thyroid cancer 03/26/2014  . Benign prostatic hyperplasia with urinary obstruction 03/23/2014  . Dysphonia 03/17/2014  . Gastro-esophageal reflux disease without esophagitis 03/17/2014  . Hyperlipidemia, unspecified 03/17/2014  . Other intervertebral disc degeneration, lumbar region 03/17/2014  . PAD (peripheral artery disease) ([place]) 03/17/2014  . Postoperative hypothyroidism 03/17/2014  . Chronic kidney disease, stage 2 (mild) 03/17/2014  . Occasional tremors 03/17/2014   Amador Cunas, PT, DPT Donald Prose Rune Mendez 06/27/2020, 9:34 AM  [place] [address] [place], [place], [address] Phone: [phone]   Fax:  [phone]  Name:  Victor [name] MRN: [id number] Date of Birth: 11-Jul-1936

## 2020-07-05 ENCOUNTER — Ambulatory Visit: Payer: Medicare Other | Admitting: Physical Therapy

## 2020-07-05 ENCOUNTER — Encounter: Payer: Self-pay | Admitting: Physical Therapy

## 2020-07-05 ENCOUNTER — Other Ambulatory Visit: Payer: Self-pay

## 2020-07-05 DIAGNOSIS — M25551 Pain in right hip: Secondary | ICD-10-CM

## 2020-07-05 DIAGNOSIS — M25652 Stiffness of left hip, not elsewhere classified: Secondary | ICD-10-CM

## 2020-07-05 DIAGNOSIS — M6281 Muscle weakness (generalized): Secondary | ICD-10-CM

## 2020-07-05 DIAGNOSIS — M25552 Pain in left hip: Secondary | ICD-10-CM

## 2020-07-05 DIAGNOSIS — M25651 Stiffness of right hip, not elsewhere classified: Secondary | ICD-10-CM

## 2020-07-05 NOTE — Patient Instructions (Signed)
Access Code: [email or web address] URL: [email or web address] Date: 07/05/2020 Prepared by: Jeral Pinch  Exercises Beginner Side Kick - 1 x daily - 10 reps - 1-3 sets Beginner Sidelying Leg Circle - 1 x daily - 10 reps - 1-3 sets Sidelying Over and Back - 1 x daily - 10 reps - 1-3 sets Supine Bridge - 1 x daily - 1-3 sets - 10 reps

## 2020-07-05 NOTE — Therapy (Signed)
[place], [place], [address] Phone: [phone]   Fax:  [phone]  Physical Therapy Treatment  Patient Details  Name: Victor [name] MRN: [id number] Date of Birth: 1936-04-07 Referring Provider (PT): Frederik Pear, MD   Encounter Date: 07/05/2020   PT End of Session - 07/05/20 1016    Visit Number 5    Number of Visits 16    Date for PT Re-Evaluation 08/08/20    Authorization Type BCBS    PT Start Time 1016    PT Stop Time 1106    PT Time Calculation (min) 50 min    Activity Tolerance Patient tolerated treatment well    Behavior During Therapy [facility] for tasks assessed/performed           Past Medical History:  Diagnosis Date  . Arthritis   . Atherosclerosis   . Atherosclerotic PVD with intermittent claudication (HCC)    stent left leg  . BPH (benign prostatic hyperplasia)   . Bulging lumbar disc   . Cancer ([place]) 11/12/14   vocal cord  carcinoma in situ , radiation; thyroid cancer  . Carotid bruit   . Chronic kidney disease    chronic stage III  . COPD (chronic obstructive pulmonary disease) ([place])   . DDD (degenerative disc disease), lumbar   . Dysphonia   . Dysplasia of true vocal cord   . GERD (gastroesophageal reflux disease)   . H/O carotid atherosclerosis    b/l  . Hyperlipidemia   . Hypothyroidism   . Occasional tremors    left hand managed with propranolol  . Peripheral vascular angioplasty status with implants and grafts   . Precancerous lesion 03/05/2018   premelanoma removed from back.   . Radiation 03/10/15- 04/18/16   Larynx  . Sleep apnea    hasn't used CPAP in years  . Spondylosis of lumbosacral region   . Thyroid disease     Past Surgical History:  Procedure Laterality Date  . APPENDECTOMY    . COLONOSCOPY W/ POLYPECTOMY    . DIODE LASER APPLICATION Left 88/41/6606   Procedure: DIODE LASER APPLICATION;  Surgeon: Hayden Pedro, MD;  Location: [place];   Service: Ophthalmology;  Laterality: Left;  . MEMBRANE PEEL Left 08/18/2019   Procedure: MEMBRANE PEEL;  Surgeon: Hayden Pedro, MD;  Location: [place];  Service: Ophthalmology;  Laterality: Left;  . MOUTH SURGERY     tooth extraction  . PARS PLANA VITRECTOMY Left 08/18/2019   PARS PLANA VITRECTOMY 27 GAUGE (Left)  . PARS PLANA VITRECTOMY 27 GAUGE Left 08/18/2019   Procedure: PARS PLANA VITRECTOMY 27 GAUGE;  Surgeon: Hayden Pedro, MD;  Location: [place];  Service: Ophthalmology;  Laterality: Left;  . PHOTOCOAGULATION WITH LASER Left 08/18/2019   Procedure: PHOTOCOAGULATION WITH LASER;  Surgeon: Hayden Pedro, MD;  Location: [place];  Service: Ophthalmology;  Laterality: Left;  . THYROID SURGERY    . TONSILLECTOMY    . vocal cord biopsy  11/12/14   squamous cell carcinoma in situ    There were no vitals filed for this visit.   Subjective Assessment - 07/05/20 1016    Subjective The hips feel about the same, only has pain when he lies on them.    Patient Stated Goals Would like to resolve pain    Currently in Pain? No/denies              [facility] PT Assessment - 07/05/20 0001  Assessment   Medical Diagnosis B Hip Pain                         [facility] Adult PT Treatment/Exercise - 07/05/20 0001      Knee/Hip Exercises: Stretches   Piriformis Stretch Both;30 seconds      Knee/Hip Exercises: Aerobic   Recumbent Bike 5'      Knee/Hip Exercises: Supine   Bridges Both;10 reps      Knee/Hip Exercises: Sidelying   Other Sidelying Knee/Hip Exercises 10 reps each pilates FWD.BWD kicks, CW/CCW circles, and FWD/BWD taps VCs for form      Modalities   Modalities Iontophoresis;Moist Heat      Moist Heat Therapy   Number Minutes Moist Heat 10 Minutes    Moist Heat Location --   buttocks and Lt lateral hip      Iontophoresis   Type of Iontophoresis Dexamethasone    Location left GT area    Dose 66mA dose    Time 4 hour patch #4      Manual Therapy   Manual  Therapy Soft tissue mobilization    Soft tissue mobilization STm and TPR to Lt glut med palbale tightness and tenderness during this.                     PT Short Term Goals - 06/21/20 1011      PT SHORT TERM GOAL #1   Title Pt will be independent and compliant with initial HEP.    Status Achieved             PT Long Term Goals - 06/13/20 6294      PT LONG TERM GOAL #1   Title Pt will increase B hip IR PROM to >25.    Baseline 14 L, 18 R    Time 8    Period Weeks    Status New    Target Date 08/08/20      PT LONG TERM GOAL #2   Title Pt will increase B SLS to 20 seconds or greater.    Time 8    Period Weeks    Status New    Target Date 08/08/20      PT LONG TERM GOAL #3   Title Pt will increase hip MMT to 4+/5 for increased stability around hips.    Time 8    Period Weeks    Status New    Target Date 08/08/20      PT LONG TERM GOAL #4   Title Pt will improve flexilbility of hamstrings to less than 40 degrees from 180 in 90/90 position.    Time 8    Period Weeks    Status New    Target Date 08/08/20                 Plan - 07/05/20 1032    Clinical Impression Statement Angeldejesus reports no change, still having the same pain when he lies on the Lt side.  Performed some manual work to decrease tightness and added in strengthening for the gluts.  Pt is not convinced this will help.  He received his 4th ionto patch today as well.  Would benefit from more manual work to decrease banding and strengthening. Heat was placed at end of treatment to increase blood flow and decrease soreness after manaul work    [employer]    PT Frequency 2x / week  PT Duration 4 weeks    PT Treatment/Interventions ADLs/Self Care Home Management;Iontophoresis 4mg /ml Dexamethasone;Moist Heat;Electrical Stimulation;Cryotherapy;Joint Manipulations;Dry needling;Manual techniques;Neuromuscular re-education;Therapeutic activities;Therapeutic exercise;Balance training;Passive  range of motion;Vasopneumatic Device    PT Next Visit Plan continue to add exercises for flexibility and strength and continue with ionto    PT Home Exercise Plan [id number]    Consulted and Agree with Plan of Care Patient           Patient will benefit from skilled therapeutic intervention in order to improve the following deficits and impairments:  Decreased balance, Decreased mobility, Decreased strength, Postural dysfunction, Improper body mechanics, Impaired flexibility, Hypomobility, Decreased range of motion, Increased fascial restricitons  Visit Diagnosis: Pain in left hip  Pain in right hip  Stiffness of left hip, not elsewhere classified  Stiffness of right hip, not elsewhere classified  Muscle weakness (generalized)     Problem List Patient Active Problem List   Diagnosis Date Noted  . COPD, moderate ([place]) 05/24/2020  . Bilateral hip pain 05/04/2020  . Plantar fasciitis of left foot 03/30/2020  . Chronic left SI joint pain 03/01/2020  . Shortness of breath 10/01/2019  . Former smoker 10/01/2019  . Healthcare maintenance 10/01/2019  . Epiretinal membrane (ERM) of left eye 07/22/2019  . Centrilobular emphysema ([place]) 07/02/2019  . Pharyngeal dysphagia 04/07/2019  . Cervical radiculopathy 03/16/2019  . 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 Lumbar radiculopathy 04/16/2017  . Rotator cuff syndrome of left shoulder 04/16/2017  . Trochanteric bursitis, left hip 04/16/2017  . Atherosclerosis of artery of both lower extremities ([place]) 11/14/2016  . Bilateral carotid artery stenosis 11/14/2016  . Vitamin D deficiency 07/31/2016  . Facet arthritis of lumbar region 09/26/2015  . 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  . History of thyroid cancer 03/26/2014  . Benign prostatic hyperplasia with urinary obstruction 03/23/2014  . Dysphonia 03/17/2014  . Gastro-esophageal reflux disease without esophagitis 03/17/2014  . Hyperlipidemia, unspecified 03/17/2014  . Other  intervertebral disc degeneration, lumbar region 03/17/2014  . PAD (peripheral artery disease) ([place]) 03/17/2014  . Postoperative hypothyroidism 03/17/2014  . Chronic kidney disease, stage 2 (mild) 03/17/2014  . Occasional tremors 03/17/2014    Jeral Pinch PT  07/05/2020, 11:01 AM  [place], [place], [address] Phone: [phone]   Fax:  [phone]  Name: IFEANYICHUKWU WICKHAM MRN: [id number] Date of Birth: Oct 08, 1936

## 2020-07-07 ENCOUNTER — Encounter: Payer: Self-pay | Admitting: Physical Therapy

## 2020-07-07 ENCOUNTER — Other Ambulatory Visit: Payer: Self-pay

## 2020-07-07 ENCOUNTER — Ambulatory Visit: Payer: Medicare Other | Attending: Orthopedic Surgery | Admitting: Physical Therapy

## 2020-07-07 DIAGNOSIS — M25552 Pain in left hip: Secondary | ICD-10-CM

## 2020-07-07 DIAGNOSIS — M6281 Muscle weakness (generalized): Secondary | ICD-10-CM | POA: Diagnosis present

## 2020-07-07 DIAGNOSIS — M25551 Pain in right hip: Secondary | ICD-10-CM | POA: Diagnosis present

## 2020-07-07 DIAGNOSIS — M25652 Stiffness of left hip, not elsewhere classified: Secondary | ICD-10-CM

## 2020-07-07 DIAGNOSIS — M25651 Stiffness of right hip, not elsewhere classified: Secondary | ICD-10-CM | POA: Diagnosis present

## 2020-07-07 NOTE — Therapy (Signed)
[place], [place], [address] Phone: [phone]   Fax:  [phone]  Physical Therapy Treatment  Patient Details  Name: Victor [name] MRN: [id number] Date of Birth: 1936/06/20 Referring Provider (PT): Frederik Pear, MD   Encounter Date: 07/07/2020   PT End of Session - 07/07/20 1207    Visit Number 6    Date for PT Re-Evaluation 08/08/20    Authorization Type BCBS    PT Start Time 1053    PT Stop Time 1133    PT Time Calculation (min) 40 min    Activity Tolerance Patient tolerated treatment well    Behavior During Therapy [facility] for tasks assessed/performed           Past Medical History:  Diagnosis Date  . Arthritis   . Atherosclerosis   . Atherosclerotic PVD with intermittent claudication (HCC)    stent left leg  . BPH (benign prostatic hyperplasia)   . Bulging lumbar disc   . Cancer ([place]) 11/12/14   vocal cord  carcinoma in situ , radiation; thyroid cancer  . Carotid bruit   . Chronic kidney disease    chronic stage III  . COPD (chronic obstructive pulmonary disease) ([place])   . DDD (degenerative disc disease), lumbar   . Dysphonia   . Dysplasia of true vocal cord   . GERD (gastroesophageal reflux disease)   . H/O carotid atherosclerosis    b/l  . Hyperlipidemia   . Hypothyroidism   . Occasional tremors    left hand managed with propranolol  . Peripheral vascular angioplasty status with implants and grafts   . Precancerous lesion 03/05/2018   premelanoma removed from back.   . Radiation 03/10/15- 04/18/16   Larynx  . Sleep apnea    hasn't used CPAP in years  . Spondylosis of lumbosacral region   . Thyroid disease     Past Surgical History:  Procedure Laterality Date  . APPENDECTOMY    . COLONOSCOPY W/ POLYPECTOMY    . DIODE LASER APPLICATION Left 63/84/5364   Procedure: DIODE LASER APPLICATION;  Surgeon: Hayden Pedro, MD;  Location: [place];  Service: Ophthalmology;   Laterality: Left;  . MEMBRANE PEEL Left 08/18/2019   Procedure: MEMBRANE PEEL;  Surgeon: Hayden Pedro, MD;  Location: [place];  Service: Ophthalmology;  Laterality: Left;  . MOUTH SURGERY     tooth extraction  . PARS PLANA VITRECTOMY Left 08/18/2019   PARS PLANA VITRECTOMY 27 GAUGE (Left)  . PARS PLANA VITRECTOMY 27 GAUGE Left 08/18/2019   Procedure: PARS PLANA VITRECTOMY 27 GAUGE;  Surgeon: Hayden Pedro, MD;  Location: [place];  Service: Ophthalmology;  Laterality: Left;  . PHOTOCOAGULATION WITH LASER Left 08/18/2019   Procedure: PHOTOCOAGULATION WITH LASER;  Surgeon: Hayden Pedro, MD;  Location: [place];  Service: Ophthalmology;  Laterality: Left;  . THYROID SURGERY    . TONSILLECTOMY    . vocal cord biopsy  11/12/14   squamous cell carcinoma in situ    There were no vitals filed for this visit.   Subjective Assessment - 07/07/20 1204    Subjective I am really sore from where she worked on me in the lateral hips and buttocks    Currently in Pain? Yes    Pain Score 2     Pain Location Hip    Pain Orientation Left    Pain Descriptors / Indicators Sore    Aggravating Factors  lying on that  side hurts, the massage made me sore                             [facility] Adult PT Treatment/Exercise - 07/07/20 0001      Knee/Hip Exercises: Stretches   Passive Hamstring Stretch Both;4 reps;20 seconds    ITB Stretch Both;4 reps;20 seconds    Piriformis Stretch Both;4 reps;20 seconds    Other Knee/Hip Stretches IR stretch of the left hip      Knee/Hip Exercises: Aerobic   Recumbent Bike 5'    Nustep level 5 x 6 minutes      Knee/Hip Exercises: Machines for Strengthening   Cybex Knee Extension 15# 2x10    Cybex Knee Flexion 35# 2x10      Knee/Hip Exercises: Seated   Ball Squeeze 2x10      Knee/Hip Exercises: Supine   Bridges Both;10 reps    Bridges with Clamshell Both;15 reps      Iontophoresis   Type of Iontophoresis Dexamethasone    Location left GT area     Dose 27m dose    Time 4 hour patch #5                    PT Short Term Goals - 06/21/20 1011      PT SHORT TERM GOAL #1   Title Pt will be independent and compliant with initial HEP.    Status Achieved             PT Long Term Goals - 07/07/20 1210      PT LONG TERM GOAL #1   Title Pt will increase B hip IR PROM to >25.    Status Partially Met      PT LONG TERM GOAL #2   Title Pt will increase B SLS to 20 seconds or greater.    Status Partially Met      PT LONG TERM GOAL #3   Title Pt will increase hip MMT to 4+/5 for increased stability around hips.    Status Partially Met      PT LONG TERM GOAL #4   Title Pt will improve flexilbility of hamstrings to less than 40 degrees from 180 in 90/90 position.    Status Partially Met                 Plan - 07/07/20 1208    Clinical Impression Statement I really tried to change the angles of the ITB stretch to focus the stretch on where he has his soreness and pain, it involved me doing a little more push down to the floor with the leg across his body.  He is more tender today.  He is reporting that he usually does not hurt unless he lies on that left side, reports not much change with this since starting PT    PT Next Visit Plan I really stressed the need for him to do the flexibility at home and that we will place on hold due to not progressing    Consulted and Agree with Plan of Care Patient           Patient will benefit from skilled therapeutic intervention in order to improve the following deficits and impairments:  Decreased balance, Decreased mobility, Decreased strength, Postural dysfunction, Improper body mechanics, Impaired flexibility, Hypomobility, Decreased range of motion, Increased fascial restricitons  Visit Diagnosis: Pain in left hip  Pain in right hip  Stiffness of  left hip, not elsewhere classified  Stiffness of right hip, not elsewhere classified  Muscle weakness  (generalized)     Problem List Patient Active Problem List   Diagnosis Date Noted  . COPD, moderate ([place]) 05/24/2020  . Bilateral hip pain 05/04/2020  . Plantar fasciitis of left foot 03/30/2020  . Chronic left SI joint pain 03/01/2020  . Shortness of breath 10/01/2019  . Former smoker 10/01/2019  . Healthcare maintenance 10/01/2019  . Epiretinal membrane (ERM) of left eye 07/22/2019  . Centrilobular emphysema ([place]) 07/02/2019  . Pharyngeal dysphagia 04/07/2019  . Cervical radiculopathy 03/16/2019  . 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 Lumbar radiculopathy 04/16/2017  . Rotator cuff syndrome of left shoulder 04/16/2017  . Trochanteric bursitis, left hip 04/16/2017  . Atherosclerosis of artery of both lower extremities ([place]) 11/14/2016  . Bilateral carotid artery stenosis 11/14/2016  . Vitamin D deficiency 07/31/2016  . Facet arthritis of lumbar region 09/26/2015  . 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  . History of thyroid cancer 03/26/2014  . Benign prostatic hyperplasia with urinary obstruction 03/23/2014  . Dysphonia 03/17/2014  . Gastro-esophageal reflux disease without esophagitis 03/17/2014  . Hyperlipidemia, unspecified 03/17/2014  . Other intervertebral disc degeneration, lumbar region 03/17/2014  . PAD (peripheral artery disease) ([place]) 03/17/2014  . Postoperative hypothyroidism 03/17/2014  . Chronic kidney disease, stage 2 (mild) 03/17/2014  . Occasional tremors 03/17/2014    Sumner Boast., PT 07/07/2020, 12:12 PM  [place], [place], [address] Phone: [phone]   Fax:  [phone]  Name: Victor [name] MRN: [id number] Date of Birth: 1936-02-08

## 2020-07-12 ENCOUNTER — Telehealth: Payer: Self-pay | Admitting: Family Medicine

## 2020-07-12 NOTE — Telephone Encounter (Signed)
Patient scheduled an appointment via MyChart for thumping in ears, shortness of breath and dizziness. Called patient and explained that I needed to transfer him to the nurse triage line for further evaluation. Patient was okay with this and call was transferred.

## 2020-07-13 ENCOUNTER — Other Ambulatory Visit: Payer: Self-pay

## 2020-07-14 ENCOUNTER — Ambulatory Visit (INDEPENDENT_AMBULATORY_CARE_PROVIDER_SITE_OTHER): Payer: Medicare Other | Admitting: Family Medicine

## 2020-07-14 ENCOUNTER — Encounter: Payer: Self-pay | Admitting: Family Medicine

## 2020-07-14 VITALS — BP 118/68 | HR 76 | Temp 97.9°F | Ht 70.0 in | Wt 215.0 lb

## 2020-07-14 DIAGNOSIS — H6593 Unspecified nonsuppurative otitis media, bilateral: Secondary | ICD-10-CM

## 2020-07-14 DIAGNOSIS — H6983 Other specified disorders of Eustachian tube, bilateral: Secondary | ICD-10-CM | POA: Diagnosis not present

## 2020-07-14 DIAGNOSIS — R42 Dizziness and giddiness: Secondary | ICD-10-CM | POA: Diagnosis not present

## 2020-07-14 MED ORDER — PREDNISONE 20 MG PO TABS: ORAL_TABLET | ORAL | 0 refills | Status: DC

## 2020-07-14 NOTE — Progress Notes (Signed)
Victor [name] is a 84 y.o. male  Chief Complaint  Patient presents with  . Acute Visit    c/o having a thumping sound in both ears x 2 weeks, dizziness x 1 month,    HPI: Victor [name] is a 84 y.o. male who complains of a having an intermittent "thumping sound" in B/L ears x 2 weeks. + pressure, ears pop when he opens his jaw. It can be painful at times. No change in hearing. No drainage from ears.  No increased nasal congestion. He has been using flonase daily x 3 mo and claritin daily. Runny nose in AM. No headaches.  Pt also complains of episode of lightheadedness lasting less than 30 min that occurred 4-6 wks ago. Denies associated symptoms of nausea, diaphoresis, CP, SOB, palpitations. He could not think of any identifiable triggers.  Pt was seen by me in 03/2020 for similar symptoms. He has a h/o vertigo and states first episode > 10 years ago in [place]. He saw ENT at that time and also had normal EKG, CT head and possible MRI brain as well.   ENT - Dr. Melida Quitter [facility]) - last appt in 05/2019  Carotid duplex in 04/2020: Summary: Right Carotid: Velocities in the right ICA are consistent with a 1-39% stenosis. Nonhemodynamically significant plaque <50% noted in the CCA. Left Carotid: Velocities in the left ICA are consistent with a 1-39% stenosis. Nonhemodynamically significant plaque <50% noted in the CCA. Vertebrals: Left vertebral artery demonstrates antegrade flow. Right vertebral artery demonstrates high resistant flow. Minimum flow noted in the vertebral artery. High resistant waveform is evident suggesting distal occlusion vs high grade stenosis. Subclavians: Right subclavian artery flow was disturbed. Normal flow hemodynamics were seen in the left subclavian artery.  Echo in 07/2019: Impression: Left ventricle cavity is normal in size. Mild concentric hypertrophy of  the left ventricle. Normal LV systolic function with EF 57%. Normal global  wall motion.  Doppler evidence of grade I (impaired) diastolic dysfunction,  normal LAP.  Moderate (Grade II) mitral regurgitation.  Inadequate TR jet to estimate pulmonary artery systolic pressure. Normal  right atrial pressure  PFT in 10/2019: - showed moderate obstruction Pt was given Breo 100 inhaler 1 puff dailyin 05/2020 and was to f/u with Dr. Elsworth Soho in 3-56mo   Lab Results  Component Value Date   NA 142 09/17/2019   K 4.0 09/17/2019   CL 105 09/17/2019   CO2 30 09/17/2019   Lab Results  Component Value Date   CREATININE 1.05 09/17/2019   Lab Results  Component Value Date   BUN 12 09/17/2019   Lab Results  Component Value Date   WBC 8.7 09/17/2019   HGB 13.8 09/17/2019   HCT 41.9 09/17/2019   MCV 96.7 09/17/2019   PLT 154.0 09/17/2019   No results found for: HGBA1C   Past Medical History:  Diagnosis Date  . Arthritis   . Atherosclerosis   . Atherosclerotic PVD with intermittent claudication (HCC)    stent left leg  . BPH (benign prostatic hyperplasia)   . Bulging lumbar disc   . Cancer ([place]) 11/12/14   vocal cord  carcinoma in situ , radiation; thyroid cancer  . Carotid bruit   . Chronic kidney disease    chronic stage III  . COPD (chronic obstructive pulmonary disease) ([place])   . DDD (degenerative disc disease), lumbar   . Dysphonia   . Dysplasia of true vocal cord   . GERD (gastroesophageal reflux disease)   .  H/O carotid atherosclerosis    b/l  . Hyperlipidemia   . Hypothyroidism   . Occasional tremors    left hand managed with propranolol  . Peripheral vascular angioplasty status with implants and grafts   . Precancerous lesion 03/05/2018   premelanoma removed from back.   . Radiation 03/10/15- 04/18/16   Larynx  . Sleep apnea    hasn't used CPAP in years  . Spondylosis of lumbosacral region   . Thyroid disease     Past Surgical History:  Procedure Laterality Date  . APPENDECTOMY    . COLONOSCOPY W/ POLYPECTOMY    . DIODE LASER APPLICATION Left 56/38/7564    Procedure: DIODE LASER APPLICATION;  Surgeon: Hayden Pedro, MD;  Location: [place];  Service: Ophthalmology;  Laterality: Left;  . MEMBRANE PEEL Left 08/18/2019   Procedure: MEMBRANE PEEL;  Surgeon: Hayden Pedro, MD;  Location: [place];  Service: Ophthalmology;  Laterality: Left;  . MOUTH SURGERY     tooth extraction  . PARS PLANA VITRECTOMY Left 08/18/2019   PARS PLANA VITRECTOMY 27 GAUGE (Left)  . PARS PLANA VITRECTOMY 27 GAUGE Left 08/18/2019   Procedure: PARS PLANA VITRECTOMY 27 GAUGE;  Surgeon: Hayden Pedro, MD;  Location: [place];  Service: Ophthalmology;  Laterality: Left;  . PHOTOCOAGULATION WITH LASER Left 08/18/2019   Procedure: PHOTOCOAGULATION WITH LASER;  Surgeon: Hayden Pedro, MD;  Location: [place];  Service: Ophthalmology;  Laterality: Left;  . THYROID SURGERY    . TONSILLECTOMY    . vocal cord biopsy  11/12/14   squamous cell carcinoma in situ    Social History   Socioeconomic History  . Marital status: Married    Spouse name: Not on file  . Number of children: Not on file  . Years of education: Not on file  . Highest education level: Not on file  Occupational History  . Not on file  Tobacco Use  . Smoking status: Former Smoker    Packs/day: 2.00    Years: 51.00    Pack years: 102.00    Types: Cigarettes    Start date: 107    Quit date: 03/05/2004    Years since quitting: 16.3  . Smokeless tobacco: Never Used  Vaping Use  . Vaping Use: Never used  Substance and Sexual Activity  . Alcohol use: Yes    Alcohol/week: 7.0 standard drinks    Types: 7 Standard drinks or equivalent per week  . Drug use: No  . Sexual activity: Not on file  Other Topics Concern  . Not on file  Social History Narrative  . Not on file   Social Determinants of Health   Financial Resource Strain:   . Difficulty of Paying Living Expenses: Not on file  Food Insecurity:   . Worried About Charity fundraiser in the Last Year: Not on file  . Ran Out of Food in the Last  Year: Not on file  Transportation Needs:   . Lack of Transportation (Medical): Not on file  . Lack of Transportation (Non-Medical): Not on file  Physical Activity:   . Days of Exercise per Week: Not on file  . Minutes of Exercise per Session: Not on file  Stress:   . Feeling of Stress : Not on file  Social Connections:   . Frequency of Communication with Friends and Family: Not on file  . Frequency of Social Gatherings with Friends and Family: Not on file  . Attends Religious Services: Not on file  .  Active Member of Clubs or Organizations: Not on file  . Attends Archivist Meetings: Not on file  . Marital Status: Not on file  Intimate Partner Violence:   . Fear of Current or Ex-Partner: Not on file  . Emotionally Abused: Not on file  . Physically Abused: Not on file  . Sexually Abused: Not on file    History reviewed. No pertinent family history.   Immunization History  Administered Date(s) Administered  . Influenza, High Dose Seasonal PF 07/06/2013, 08/14/2018, 08/10/2019  . Influenza,inj,Quad PF,6+ Mos 08/10/2015, 07/03/2016  . Influenza,inj,quad, With Preservative 08/09/2014  . Influenza-Unspecified 08/09/2014, 08/10/2015, 07/03/2016, 08/12/2017  . PFIZER SARS-COV-2 Vaccination 12/13/2019, 01/07/2020  . Pneumococcal Conjugate-13 09/20/2014  . Pneumococcal Polysaccharide-23 08/05/2013  . Td 10/11/2017  . Zoster 07/06/2013  . Zoster Recombinat (Shingrix) 09/16/2017    Outpatient Encounter Medications as of 07/14/2020  Medication Sig Note  . aspirin 81 MG tablet Take 81 mg by mouth daily. 08/13/2019: If remember  . Cholecalciferol (VITAMIN D3) 1000 units CAPS Take 1,000 Units by mouth daily.  08/13/2019: When remembers  . doxazosin (CARDURA) 2 MG tablet Take 1 tablet (2 mg total) by mouth at bedtime.   . fluticasone (FLONASE) 50 MCG/ACT nasal spray Place 1 spray into both nostrils 2 (two) times daily. Aller-flo   . fluticasone furoate-vilanterol (BREO ELLIPTA)  100-25 MCG/INH AEPB Inhale 1 puff into the lungs daily.   Marland Kitchen HYDROcodone-acetaminophen (NORCO/VICODIN) 5-325 MG tablet Take 1 tablet by mouth every 6 (six) hours as needed for moderate pain.   Marland Kitchen levothyroxine (SYNTHROID) 150 MCG tablet TAKE ONE TABLET BY MOUTH ONE TIME DAILY  BEFORE BREAKFAST   . Loratadine (CLARITIN PO) Take 10 mg by mouth at bedtime. Allertec   . Melatonin 10 MG TABS Take by mouth at bedtime.   . NON FORMULARY Take 3 drops by mouth daily. HEMP OIL   . omeprazole (PRILOSEC) 20 MG capsule Take 1 capsule (20 mg total) by mouth 2 (two) times daily before a meal.   . propranolol ER (INDERAL LA) 80 MG 24 hr capsule Take 1 capsule (80 mg total) by mouth daily.   . simvastatin (ZOCOR) 40 MG tablet Take 1 tablet (40 mg total) by mouth at bedtime.   . traMADol (ULTRAM) 50 MG tablet TAKE ONE TABLET BY MOUTH EVERY TWELVE HOURS AS NEEDED FOR SEVERE PAIN   . umeclidinium-vilanterol (ANORO ELLIPTA) 62.5-25 MCG/INH AEPB Inhale 1 puff into the lungs daily.   . fluorometholone (FML) 0.1 % ophthalmic suspension fluorometholone 0.1 % eye drops,suspension   . prednisoLONE acetate (PRED FORTE) 1 % ophthalmic suspension prednisolone acetate 1 % eye drops,suspension    No facility-administered encounter medications on file as of 07/14/2020.     ROS: Pertinent positives and negatives noted in HPI. Remainder of ROS non-contributory    No Known Allergies  BP 118/68   Pulse 76   Temp 97.9 F (36.6 C) (Temporal)   Ht 5\' 10"  (1.778 m)   Wt 215 lb (97.5 kg)   SpO2 92%   BMI 30.85 kg/m    BP Readings from Last 3 Encounters:  07/14/20 118/68  06/14/20 (!) 144/71  05/24/20 130/70   Pulse Readings from Last 3 Encounters:  07/14/20 76  06/14/20 66  05/24/20 64   Wt Readings from Last 3 Encounters:  07/14/20 215 lb (97.5 kg)  06/14/20 220 lb (99.8 kg)  05/24/20 224 lb 3.2 oz (101.7 kg)    Physical Exam Vitals reviewed.  Constitutional:  General: He is not in acute distress.     Appearance: Normal appearance. He is not ill-appearing.  HENT:     Right Ear: Ear canal and external ear normal. No drainage, swelling or tenderness. A middle ear effusion is present.     Left Ear: Ear canal and external ear normal. No drainage, swelling or tenderness. A middle ear effusion is present.     Nose:     Right Turbinates: Not swollen or pale.     Left Turbinates: Not swollen or pale.     Right Sinus: No maxillary sinus tenderness or frontal sinus tenderness.     Left Sinus: No maxillary sinus tenderness.  Cardiovascular:     Rate and Rhythm: Normal rate and regular rhythm.  Pulmonary:     Effort: No respiratory distress.  Musculoskeletal:     Right lower leg: No edema.     Left lower leg: No edema.  Lymphadenopathy:     Cervical: No cervical adenopathy.  Neurological:     General: No focal deficit present.     Mental Status: He is alert and oriented to person, place, and time.  Psychiatric:        Mood and Affect: Mood normal.        Behavior: Behavior normal.      A/P:   1. Episodic lightheadedness - 4 episodes in 10 years - all very similar, not getting worse  - saw ENT in the past, had CT head, normal carotid US and echo - advised pt to keep a log of symptoms as well as food intake, water intake, sleep etc in the day/hours prior to symptoms - meclizine PRN  2. Eustachian tube dysfunction, bilateral 3. Middle ear effusion, bilateral - predniSONE (DELTASONE) 20 MG tablet; 3 tabs po daily x 3 days, 2 tabs po daily x 3 days, 1 tab po daily x 3 days, 1/2 tabs po x 3 days  Dispense: 20 tablet; Refill: 0 - cont with flonase, claritin, advised regular use of nasal saline spray - could consider trial of singulair - f/u in 2 wks if no/minimal improvement  This visit occurred during the SARS-CoV-2 public health emergency.  Safety protocols were in place, including screening questions prior to the visit, additional usage of staff PPE, and extensive cleaning of exam room  while observing appropriate contact time as indicated for disinfecting solutions.

## 2020-07-22 ENCOUNTER — Ambulatory Visit: Payer: Self-pay

## 2020-07-22 ENCOUNTER — Encounter: Payer: Self-pay | Admitting: Family Medicine

## 2020-07-22 ENCOUNTER — Ambulatory Visit (INDEPENDENT_AMBULATORY_CARE_PROVIDER_SITE_OTHER): Payer: Medicare Other | Admitting: Family Medicine

## 2020-07-22 ENCOUNTER — Other Ambulatory Visit: Payer: Self-pay

## 2020-07-22 VITALS — BP 127/58 | HR 70 | Ht 70.0 in | Wt 215.0 lb

## 2020-07-22 DIAGNOSIS — M533 Sacrococcygeal disorders, not elsewhere classified: Secondary | ICD-10-CM

## 2020-07-22 DIAGNOSIS — G8929 Other chronic pain: Secondary | ICD-10-CM

## 2020-07-22 MED ORDER — TRIAMCINOLONE ACETONIDE 40 MG/ML IJ SUSP
40.0000 mg | Freq: Once | INTRAMUSCULAR | Status: AC
  Administered 2020-07-22: 40 mg via INTRA_ARTICULAR

## 2020-07-22 NOTE — Assessment & Plan Note (Signed)
Acute exacerbation of his underlying pain.  Did get improvement with previous steroid injection. -Counseled on home exercise therapy and supportive care. -SI joint injection.

## 2020-07-22 NOTE — Patient Instructions (Signed)
Good to see you Please try ice  Please try the exercises   Please send me a message in MyChart with any questions or updates.  Please see me back in 6-8 weeks.   --Dr. [name]

## 2020-07-22 NOTE — Progress Notes (Signed)
Victor [name] - 84 y.o. male MRN [id number]  Date of birth: Nov 10, 1935  SUBJECTIVE:  Including CC & ROS.  Chief Complaint  Patient presents with  . Follow-up    left hip    Victor [name] is a 84 y.o. male that is presenting with worsening of his back pain.  Reports the pain that was similar to the SI joint has returned.  Denies any radicular symptoms.  Pain is localized over the left SI joint.  No injury or inciting event.  No improvement with modalities today.   Review of Systems See HPI   HISTORY: Past Medical, Surgical, Social, and Family History Reviewed & Updated per EMR.   Pertinent Historical Findings include:  Past Medical History:  Diagnosis Date  . Arthritis   . Atherosclerosis   . Atherosclerotic PVD with intermittent claudication (HCC)    stent left leg  . BPH (benign prostatic hyperplasia)   . Bulging lumbar disc   . Cancer ([place]) 11/12/14   vocal cord  carcinoma in situ , radiation; thyroid cancer  . Carotid bruit   . Chronic kidney disease    chronic stage III  . COPD (chronic obstructive pulmonary disease) ([place])   . DDD (degenerative disc disease), lumbar   . Dysphonia   . Dysplasia of true vocal cord   . GERD (gastroesophageal reflux disease)   . H/O carotid atherosclerosis    b/l  . Hyperlipidemia   . Hypothyroidism   . Occasional tremors    left hand managed with propranolol  . Peripheral vascular angioplasty status with implants and grafts   . Precancerous lesion 03/05/2018   premelanoma removed from back.   . Radiation 03/10/15- 04/18/16   Larynx  . Sleep apnea    hasn't used CPAP in years  . Spondylosis of lumbosacral region   . Thyroid disease     Past Surgical History:  Procedure Laterality Date  . APPENDECTOMY    . COLONOSCOPY W/ POLYPECTOMY    . DIODE LASER APPLICATION Left 38/75/6433   Procedure: DIODE LASER APPLICATION;  Surgeon: Hayden Pedro, MD;  Location: [place];  Service: Ophthalmology;  Laterality: Left;  . MEMBRANE PEEL Left  08/18/2019   Procedure: MEMBRANE PEEL;  Surgeon: Hayden Pedro, MD;  Location: [place];  Service: Ophthalmology;  Laterality: Left;  . MOUTH SURGERY     tooth extraction  . PARS PLANA VITRECTOMY Left 08/18/2019   PARS PLANA VITRECTOMY 27 GAUGE (Left)  . PARS PLANA VITRECTOMY 27 GAUGE Left 08/18/2019   Procedure: PARS PLANA VITRECTOMY 27 GAUGE;  Surgeon: Hayden Pedro, MD;  Location: [place];  Service: Ophthalmology;  Laterality: Left;  . PHOTOCOAGULATION WITH LASER Left 08/18/2019   Procedure: PHOTOCOAGULATION WITH LASER;  Surgeon: Hayden Pedro, MD;  Location: [place];  Service: Ophthalmology;  Laterality: Left;  . THYROID SURGERY    . TONSILLECTOMY    . vocal cord biopsy  11/12/14   squamous cell carcinoma in situ    No family history on file.  Social History   Socioeconomic History  . Marital status: Married    Spouse name: Not on file  . Number of children: Not on file  . Years of education: Not on file  . Highest education level: Not on file  Occupational History  . Not on file  Tobacco Use  . Smoking status: Former Smoker    Packs/day: 2.00    Years: 51.00    Pack years: 102.00    Types: Cigarettes  Start date: 9    Quit date: 03/05/2004    Years since quitting: 16.3  . Smokeless tobacco: Never Used  Vaping Use  . Vaping Use: Never used  Substance and Sexual Activity  . Alcohol use: Yes    Alcohol/week: 7.0 standard drinks    Types: 7 Standard drinks or equivalent per week  . Drug use: No  . Sexual activity: Not on file  Other Topics Concern  . Not on file  Social History Narrative  . Not on file   Social Determinants of Health   Financial Resource Strain:   . Difficulty of Paying Living Expenses: Not on file  Food Insecurity:   . Worried About Charity fundraiser in the Last Year: Not on file  . Ran Out of Food in the Last Year: Not on file  Transportation Needs:   . Lack of Transportation (Medical): Not on file  . Lack of Transportation  (Non-Medical): Not on file  Physical Activity:   . Days of Exercise per Week: Not on file  . Minutes of Exercise per Session: Not on file  Stress:   . Feeling of Stress : Not on file  Social Connections:   . Frequency of Communication with Friends and Family: Not on file  . Frequency of Social Gatherings with Friends and Family: Not on file  . Attends Religious Services: Not on file  . Active Member of Clubs or Organizations: Not on file  . Attends Archivist Meetings: Not on file  . Marital Status: Not on file  Intimate Partner Violence:   . Fear of Current or Ex-Partner: Not on file  . Emotionally Abused: Not on file  . Physically Abused: Not on file  . Sexually Abused: Not on file     PHYSICAL EXAM:  VS: BP (!) 127/58   Pulse 70   Ht 5\' 10"  (1.778 m)   Wt 215 lb (97.5 kg)   BMI 30.85 kg/m  Physical Exam Gen: NAD, alert, cooperative with exam, well-appearing MSK:  Back: Tenderness palpation of left SI joint Normal internal and external rotation of the hip. Normal strength resistance. Neurovascularly intact   Aspiration/Injection Procedure Note Victor [name] 01-26-1936  Procedure: Injection Indications: left si joint pain   Procedure Details Consent: Risks of procedure as well as the alternatives and risks of each were explained to the (patient/caregiver).  Consent for procedure obtained. Time Out: Verified patient identification, verified procedure, site/side was marked, verified correct patient position, special equipment/implants available, medications/allergies/relevent history reviewed, required imaging and test results available.  Performed.  The area was cleaned with iodine and alcohol swabs.    The left si joint was injected using 5 cc of 1% lidocaine on a 22-gauge 3-1/2 inch needle.  The syringe was switched and a mixture containing 1 cc's of 40 mg Kenalog and 4 cc's of 0.25% bupivacaine was injected.  Ultrasound was used. Images were obtained in  long views showing the injection.     A sterile dressing was applied.  Patient did tolerate procedure well.     ASSESSMENT & PLAN:   Chronic left SI joint pain Acute exacerbation of his underlying pain.  Did get improvement with previous steroid injection. -Counseled on home exercise therapy and supportive care. -SI joint injection.

## 2020-07-26 ENCOUNTER — Encounter: Payer: Self-pay | Admitting: Family Medicine

## 2020-07-26 DIAGNOSIS — R42 Dizziness and giddiness: Secondary | ICD-10-CM

## 2020-08-01 NOTE — Telephone Encounter (Signed)
I called and spoke to someone there and they said they still hadnt received the referral but that their fax machine was working now and to refax. Printed through [facility] and faxed again to [phone] on 08/01/20 at 9:45am

## 2020-08-04 ENCOUNTER — Other Ambulatory Visit: Payer: Self-pay

## 2020-08-04 ENCOUNTER — Emergency Department (HOSPITAL_BASED_OUTPATIENT_CLINIC_OR_DEPARTMENT_OTHER): Payer: Medicare Other

## 2020-08-04 ENCOUNTER — Ambulatory Visit (INDEPENDENT_AMBULATORY_CARE_PROVIDER_SITE_OTHER): Payer: Medicare Other | Admitting: Family Medicine

## 2020-08-04 ENCOUNTER — Inpatient Hospital Stay (HOSPITAL_BASED_OUTPATIENT_CLINIC_OR_DEPARTMENT_OTHER)
Admission: EM | Admit: 2020-08-04 | Discharge: 2020-08-06 | DRG: 682 | Payer: Medicare Other | Source: Ambulatory Visit | Attending: Internal Medicine | Admitting: Internal Medicine

## 2020-08-04 ENCOUNTER — Encounter (HOSPITAL_BASED_OUTPATIENT_CLINIC_OR_DEPARTMENT_OTHER): Payer: Self-pay | Admitting: Emergency Medicine

## 2020-08-04 ENCOUNTER — Encounter: Payer: Self-pay | Admitting: Family Medicine

## 2020-08-04 VITALS — BP 107/49 | HR 73 | Ht 70.0 in | Wt 215.0 lb

## 2020-08-04 DIAGNOSIS — Z923 Personal history of irradiation: Secondary | ICD-10-CM

## 2020-08-04 DIAGNOSIS — Z85850 Personal history of malignant neoplasm of thyroid: Secondary | ICD-10-CM | POA: Diagnosis not present

## 2020-08-04 DIAGNOSIS — Z85828 Personal history of other malignant neoplasm of skin: Secondary | ICD-10-CM

## 2020-08-04 DIAGNOSIS — E883 Tumor lysis syndrome: Secondary | ICD-10-CM | POA: Diagnosis not present

## 2020-08-04 DIAGNOSIS — M47817 Spondylosis without myelopathy or radiculopathy, lumbosacral region: Secondary | ICD-10-CM | POA: Diagnosis present

## 2020-08-04 DIAGNOSIS — Z20822 Contact with and (suspected) exposure to covid-19: Secondary | ICD-10-CM | POA: Diagnosis present

## 2020-08-04 DIAGNOSIS — E7800 Pure hypercholesterolemia, unspecified: Secondary | ICD-10-CM | POA: Diagnosis present

## 2020-08-04 DIAGNOSIS — Z86008 Personal history of in-situ neoplasm of other site: Secondary | ICD-10-CM

## 2020-08-04 DIAGNOSIS — M5136 Other intervertebral disc degeneration, lumbar region: Secondary | ICD-10-CM | POA: Diagnosis present

## 2020-08-04 DIAGNOSIS — R5381 Other malaise: Secondary | ICD-10-CM | POA: Diagnosis not present

## 2020-08-04 DIAGNOSIS — M25552 Pain in left hip: Secondary | ICD-10-CM

## 2020-08-04 DIAGNOSIS — J189 Pneumonia, unspecified organism: Secondary | ICD-10-CM | POA: Diagnosis present

## 2020-08-04 DIAGNOSIS — N179 Acute kidney failure, unspecified: Secondary | ICD-10-CM | POA: Diagnosis not present

## 2020-08-04 DIAGNOSIS — D72829 Elevated white blood cell count, unspecified: Secondary | ICD-10-CM | POA: Diagnosis not present

## 2020-08-04 DIAGNOSIS — C320 Malignant neoplasm of glottis: Secondary | ICD-10-CM | POA: Diagnosis present

## 2020-08-04 DIAGNOSIS — Z8521 Personal history of malignant neoplasm of larynx: Secondary | ICD-10-CM | POA: Diagnosis not present

## 2020-08-04 DIAGNOSIS — E669 Obesity, unspecified: Secondary | ICD-10-CM | POA: Diagnosis present

## 2020-08-04 DIAGNOSIS — I739 Peripheral vascular disease, unspecified: Secondary | ICD-10-CM | POA: Diagnosis present

## 2020-08-04 DIAGNOSIS — M533 Sacrococcygeal disorders, not elsewhere classified: Secondary | ICD-10-CM

## 2020-08-04 DIAGNOSIS — J449 Chronic obstructive pulmonary disease, unspecified: Secondary | ICD-10-CM | POA: Diagnosis not present

## 2020-08-04 DIAGNOSIS — R0982 Postnasal drip: Secondary | ICD-10-CM | POA: Diagnosis present

## 2020-08-04 DIAGNOSIS — M25551 Pain in right hip: Secondary | ICD-10-CM | POA: Diagnosis present

## 2020-08-04 DIAGNOSIS — C969 Malignant neoplasm of lymphoid, hematopoietic and related tissue, unspecified: Secondary | ICD-10-CM | POA: Diagnosis not present

## 2020-08-04 DIAGNOSIS — D61818 Other pancytopenia: Secondary | ICD-10-CM | POA: Diagnosis present

## 2020-08-04 DIAGNOSIS — Z79890 Hormone replacement therapy (postmenopausal): Secondary | ICD-10-CM

## 2020-08-04 DIAGNOSIS — C9500 Acute leukemia of unspecified cell type not having achieved remission: Secondary | ICD-10-CM | POA: Diagnosis present

## 2020-08-04 DIAGNOSIS — Z87891 Personal history of nicotine dependence: Secondary | ICD-10-CM

## 2020-08-04 DIAGNOSIS — J440 Chronic obstructive pulmonary disease with acute lower respiratory infection: Secondary | ICD-10-CM | POA: Diagnosis present

## 2020-08-04 DIAGNOSIS — R42 Dizziness and giddiness: Secondary | ICD-10-CM | POA: Diagnosis present

## 2020-08-04 DIAGNOSIS — N182 Chronic kidney disease, stage 2 (mild): Secondary | ICD-10-CM | POA: Diagnosis present

## 2020-08-04 DIAGNOSIS — G8929 Other chronic pain: Secondary | ICD-10-CM | POA: Diagnosis not present

## 2020-08-04 DIAGNOSIS — D689 Coagulation defect, unspecified: Secondary | ICD-10-CM | POA: Diagnosis present

## 2020-08-04 DIAGNOSIS — R5383 Other fatigue: Secondary | ICD-10-CM | POA: Diagnosis not present

## 2020-08-04 DIAGNOSIS — E039 Hypothyroidism, unspecified: Secondary | ICD-10-CM | POA: Diagnosis present

## 2020-08-04 DIAGNOSIS — M545 Low back pain, unspecified: Secondary | ICD-10-CM | POA: Diagnosis not present

## 2020-08-04 DIAGNOSIS — D649 Anemia, unspecified: Secondary | ICD-10-CM | POA: Diagnosis not present

## 2020-08-04 DIAGNOSIS — D696 Thrombocytopenia, unspecified: Secondary | ICD-10-CM | POA: Diagnosis not present

## 2020-08-04 DIAGNOSIS — G4730 Sleep apnea, unspecified: Secondary | ICD-10-CM | POA: Diagnosis present

## 2020-08-04 DIAGNOSIS — R161 Splenomegaly, not elsewhere classified: Secondary | ICD-10-CM | POA: Diagnosis present

## 2020-08-04 DIAGNOSIS — N186 End stage renal disease: Secondary | ICD-10-CM | POA: Diagnosis not present

## 2020-08-04 DIAGNOSIS — Z800 Family history of malignant neoplasm of digestive organs: Secondary | ICD-10-CM

## 2020-08-04 DIAGNOSIS — K219 Gastro-esophageal reflux disease without esophagitis: Secondary | ICD-10-CM | POA: Diagnosis present

## 2020-08-04 DIAGNOSIS — R102 Pelvic and perineal pain: Secondary | ICD-10-CM | POA: Diagnosis not present

## 2020-08-04 DIAGNOSIS — Z85820 Personal history of malignant melanoma of skin: Secondary | ICD-10-CM | POA: Diagnosis not present

## 2020-08-04 DIAGNOSIS — I959 Hypotension, unspecified: Secondary | ICD-10-CM | POA: Diagnosis present

## 2020-08-04 DIAGNOSIS — N400 Enlarged prostate without lower urinary tract symptoms: Secondary | ICD-10-CM | POA: Diagnosis present

## 2020-08-04 DIAGNOSIS — Z803 Family history of malignant neoplasm of breast: Secondary | ICD-10-CM

## 2020-08-04 DIAGNOSIS — Z79899 Other long term (current) drug therapy: Secondary | ICD-10-CM

## 2020-08-04 DIAGNOSIS — E785 Hyperlipidemia, unspecified: Secondary | ICD-10-CM | POA: Diagnosis present

## 2020-08-04 DIAGNOSIS — Z6830 Body mass index (BMI) 30.0-30.9, adult: Secondary | ICD-10-CM

## 2020-08-04 DIAGNOSIS — C4491 Basal cell carcinoma of skin, unspecified: Secondary | ICD-10-CM | POA: Diagnosis present

## 2020-08-04 DIAGNOSIS — Z7982 Long term (current) use of aspirin: Secondary | ICD-10-CM

## 2020-08-04 DIAGNOSIS — M549 Dorsalgia, unspecified: Secondary | ICD-10-CM

## 2020-08-04 LAB — CBC WITH DIFFERENTIAL/PLATELET
Abs Immature Granulocytes: 2.07 10*3/uL — ABNORMAL HIGH (ref 0.00–0.07)
Basophils Absolute: 0.1 10*3/uL (ref 0.0–0.1)
Basophils Relative: 0 %
Eosinophils Absolute: 0.3 10*3/uL (ref 0.0–0.5)
Eosinophils Relative: 1 %
HCT: 24.3 % — ABNORMAL LOW (ref 39.0–52.0)
Hemoglobin: 7.3 g/dL — ABNORMAL LOW (ref 13.0–17.0)
Immature Granulocytes: 5 %
Lymphocytes Relative: 13 %
Lymphs Abs: 5.7 10*3/uL — ABNORMAL HIGH (ref 0.7–4.0)
MCH: 31.3 pg (ref 26.0–34.0)
MCHC: 30 g/dL (ref 30.0–36.0)
MCV: 104.3 fL — ABNORMAL HIGH (ref 80.0–100.0)
Monocytes Absolute: 33.8 10*3/uL — ABNORMAL HIGH (ref 0.1–1.0)
Monocytes Relative: 76 %
Neutro Abs: 2.1 10*3/uL (ref 1.7–7.7)
Neutrophils Relative %: 5 %
Platelets: 9 10*3/uL — CL (ref 150–400)
RBC: 2.33 MIL/uL — ABNORMAL LOW (ref 4.22–5.81)
RDW: 20 % — ABNORMAL HIGH (ref 11.5–15.5)
Smear Review: NORMAL
WBC: 44 10*3/uL — ABNORMAL HIGH (ref 4.0–10.5)
nRBC: 0.2 % (ref 0.0–0.2)

## 2020-08-04 LAB — CBC
HCT: 20.6 % — ABNORMAL LOW (ref 39.0–52.0)
Hemoglobin: 6.1 g/dL — CL (ref 13.0–17.0)
MCH: 31.6 pg (ref 26.0–34.0)
MCHC: 29.6 g/dL — ABNORMAL LOW (ref 30.0–36.0)
MCV: 106.7 fL — ABNORMAL HIGH (ref 80.0–100.0)
Platelets: 7 10*3/uL — CL (ref 150–400)
RBC: 1.93 MIL/uL — ABNORMAL LOW (ref 4.22–5.81)
RDW: 19.9 % — ABNORMAL HIGH (ref 11.5–15.5)
WBC: 28 10*3/uL — ABNORMAL HIGH (ref 4.0–10.5)
nRBC: 0.1 % (ref 0.0–0.2)

## 2020-08-04 LAB — COMPREHENSIVE METABOLIC PANEL
ALT: 9 U/L (ref 0–44)
AST: 11 U/L — ABNORMAL LOW (ref 15–41)
Albumin: 3.2 g/dL — ABNORMAL LOW (ref 3.5–5.0)
Alkaline Phosphatase: 43 U/L (ref 38–126)
Anion gap: 17 — ABNORMAL HIGH (ref 5–15)
BUN: 52 mg/dL — ABNORMAL HIGH (ref 8–23)
CO2: 20 mmol/L — ABNORMAL LOW (ref 22–32)
Calcium: 7 mg/dL — ABNORMAL LOW (ref 8.9–10.3)
Chloride: 103 mmol/L (ref 98–111)
Creatinine, Ser: 2.48 mg/dL — ABNORMAL HIGH (ref 0.61–1.24)
GFR calc Af Amer: 27 mL/min — ABNORMAL LOW (ref >60)
GFR calc non Af Amer: 23 mL/min — ABNORMAL LOW (ref >60)
Glucose, Bld: 124 mg/dL — ABNORMAL HIGH (ref 70–99)
Potassium: 4 mmol/L (ref 3.5–5.1)
Sodium: 140 mmol/L (ref 135–145)
Total Bilirubin: 0.7 mg/dL (ref 0.3–1.2)
Total Protein: 6 g/dL — ABNORMAL LOW (ref 6.5–8.1)

## 2020-08-04 LAB — TROPONIN I (HIGH SENSITIVITY)
Troponin I (High Sensitivity): 5 ng/L (ref <18)
Troponin I (High Sensitivity): 4 ng/L (ref <18)

## 2020-08-04 LAB — LACTIC ACID, PLASMA
Lactic Acid, Venous: 0.7 mmol/L (ref 0.5–1.9)
Lactic Acid, Venous: 1 mmol/L (ref 0.5–1.9)

## 2020-08-04 LAB — RESPIRATORY PANEL BY RT PCR (FLU A&B, COVID)
Influenza A by PCR: NEGATIVE
Influenza B by PCR: NEGATIVE
SARS Coronavirus 2 by RT PCR: NEGATIVE

## 2020-08-04 LAB — BRAIN NATRIURETIC PEPTIDE: B Natriuretic Peptide: 46.5 pg/mL (ref 0.0–100.0)

## 2020-08-04 MED ORDER — SODIUM CHLORIDE 0.9 % IV SOLN
1.0000 g | Freq: Once | INTRAVENOUS | Status: AC
  Administered 2020-08-04: 1 g via INTRAVENOUS
  Filled 2020-08-04: qty 10

## 2020-08-04 MED ORDER — SODIUM CHLORIDE 0.9 % IV BOLUS
1000.0000 mL | Freq: Once | INTRAVENOUS | Status: AC
  Administered 2020-08-04: 1000 mL via INTRAVENOUS

## 2020-08-04 MED ORDER — SODIUM CHLORIDE 0.9% IV SOLUTION: Freq: Once | INTRAVENOUS | Status: AC

## 2020-08-04 MED ORDER — SODIUM CHLORIDE 0.9 % IV SOLN
500.0000 mg | Freq: Once | INTRAVENOUS | Status: AC
  Administered 2020-08-04: 500 mg via INTRAVENOUS
  Filled 2020-08-04: qty 500

## 2020-08-04 NOTE — Assessment & Plan Note (Signed)
He looks pale on exam.  No history of similar symptoms.  Does not appear to have an infection.  He does report spitting up bloody sputum.  He may have a bleed.   -Advised to be seen in the emergency department.  May need to have lab work and possible imaging of CT abdomen pelvis.  Called patient's wife and informed that he was being transported to the emergency department.

## 2020-08-04 NOTE — Assessment & Plan Note (Signed)
Injection was done a few weeks ago.  He had pain resolution up until 2 days ago.  Having pain in the lower back but feels different than before. -Full supportive care.

## 2020-08-04 NOTE — ED Provider Notes (Signed)
[facility] EMERGENCY DEPARTMENT Provider Note   CSN: [id number] Arrival date & time: 08/04/20  1506     History Chief Complaint  Patient presents with  . Low bp, dizziness, pale    Victor [name] is a 84 y.o. male.  84 yo M with a chief complaints of cough generalized fatigue.  Patient was seen today due to left-sided low back pain has been going on for about 3 days.  Pinpoint sharp worsens with sitting.  Denies radiation down the leg.  Has had pain like this previously though was in a different location.  Made an appointment with his sports medicine physician to have an injection.  When Victor [name] went to the office Victor [name] was then to be hypotensive and lightheaded and pale.  Patient states that Victor [name] has been feeling bad for a couple weeks.  Having increased cough change in sputum.  Denies fevers or chills.  Denies sick contacts.  Victor [name] noticed that his sputum has become more pinkish.  Denies unilateral lower extremity edema.  Denies lower extremity edema.  Denies chest pain or pressure.  His dizziness is described as a lightheadedness.  Usually worse upon standing and trying to ambulate.  The history is provided by the patient.  Illness Severity:  Moderate Onset quality:  Gradual Duration:  2 weeks Timing:  Intermittent Progression:  Waxing and waning Chronicity:  New Associated symptoms: cough and shortness of breath   Associated symptoms: no abdominal pain, no chest pain, no congestion, no diarrhea, no fever, no headaches, no myalgias, no nausea, no rash and no vomiting        Past Medical History:  Diagnosis Date  . Arthritis   . Atherosclerosis   . Atherosclerotic PVD with intermittent claudication (HCC)    stent left leg  . BPH (benign prostatic hyperplasia)   . Bulging lumbar disc   . Cancer ([place]) 11/12/14   vocal cord  carcinoma in situ , radiation; thyroid cancer  . Carotid bruit   . Chronic kidney disease    chronic stage III  . COPD (chronic obstructive pulmonary  disease) ([place])   . DDD (degenerative disc disease), lumbar   . Dysphonia   . Dysplasia of true vocal cord   . GERD (gastroesophageal reflux disease)   . H/O carotid atherosclerosis    b/l  . Hyperlipidemia   . Hypothyroidism   . Occasional tremors    left hand managed with propranolol  . Peripheral vascular angioplasty status with implants and grafts   . Precancerous lesion 03/05/2018   premelanoma removed from back.   . Radiation 03/10/15- 04/18/16   Larynx  . Sleep apnea    hasn't used CPAP in years  . Spondylosis of lumbosacral region   . Thyroid disease     Patient Active Problem List   Diagnosis Date Noted  . Malaise and fatigue 08/04/2020  . CAP (community acquired pneumonia) 08/04/2020  . COPD, moderate ([place]) 05/24/2020  . Bilateral hip pain 05/04/2020  . Plantar fasciitis of left foot 03/30/2020  . Chronic left SI joint pain 03/01/2020  . Shortness of breath 10/01/2019  . Former smoker 10/01/2019  . Healthcare maintenance 10/01/2019  . Epiretinal membrane (ERM) of left eye 07/22/2019  . Centrilobular emphysema ([place]) 07/02/2019  . Pharyngeal dysphagia 04/07/2019  . Cervical radiculopathy 03/16/2019  . 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 Lumbar radiculopathy 04/16/2017  . Rotator cuff syndrome of left shoulder 04/16/2017  . Trochanteric bursitis, left hip 04/16/2017  . Atherosclerosis of artery  of both lower extremities ([place]) 11/14/2016  . Bilateral carotid artery stenosis 11/14/2016  . Vitamin D deficiency 07/31/2016  . Facet arthritis of lumbar region 09/26/2015  . 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  . History of thyroid cancer 03/26/2014  . Benign prostatic hyperplasia with urinary obstruction 03/23/2014  . Dysphonia 03/17/2014  . Gastro-esophageal reflux disease without esophagitis 03/17/2014  . Hyperlipidemia, unspecified 03/17/2014  . Other intervertebral disc degeneration, lumbar region 03/17/2014  . PAD (peripheral artery disease) ([place])  03/17/2014  . Postoperative hypothyroidism 03/17/2014  . Chronic kidney disease, stage 2 (mild) 03/17/2014  . Occasional tremors 03/17/2014    Past Surgical History:  Procedure Laterality Date  . APPENDECTOMY    . COLONOSCOPY W/ POLYPECTOMY    . DIODE LASER APPLICATION Left 63/84/6659   Procedure: DIODE LASER APPLICATION;  Surgeon: Hayden Pedro, MD;  Location: [facility];  Service: Ophthalmology;  Laterality: Left;  . MEMBRANE PEEL Left 08/18/2019   Procedure: MEMBRANE PEEL;  Surgeon: Hayden Pedro, MD;  Location: [facility];  Service: Ophthalmology;  Laterality: Left;  . MOUTH SURGERY     tooth extraction  . PARS PLANA VITRECTOMY Left 08/18/2019   PARS PLANA VITRECTOMY 27 GAUGE (Left)  . PARS PLANA VITRECTOMY 27 GAUGE Left 08/18/2019   Procedure: PARS PLANA VITRECTOMY 27 GAUGE;  Surgeon: Hayden Pedro, MD;  Location: [facility];  Service: Ophthalmology;  Laterality: Left;  . PHOTOCOAGULATION WITH LASER Left 08/18/2019   Procedure: PHOTOCOAGULATION WITH LASER;  Surgeon: Hayden Pedro, MD;  Location: [facility];  Service: Ophthalmology;  Laterality: Left;  . THYROID SURGERY    . TONSILLECTOMY    . vocal cord biopsy  11/12/14   squamous cell carcinoma in situ       No family history on file.  Social History   Tobacco Use  . Smoking status: Former Smoker    Packs/day: 2.00    Years: 51.00    Pack years: 102.00    Types: Cigarettes    Start date: 58    Quit date: 03/05/2004    Years since quitting: 16.4  . Smokeless tobacco: Never Used  Vaping Use  . Vaping Use: Never used  Substance Use Topics  . Alcohol use: Yes    Alcohol/week: 7.0 standard drinks    Types: 7 Standard drinks or equivalent per week    Comment: 1 drink of rum per day  . Drug use: No    Home Medications Prior to Admission medications   Medication Sig Start Date End Date Taking? Authorizing Provider  aspirin 81 MG tablet Take 81 mg by mouth daily.    [provider]  Cholecalciferol (VITAMIN D3)  1000 units CAPS Take 1,000 Units by mouth daily.     [provider]  doxazosin (CARDURA) 2 MG tablet Take 1 tablet (2 mg total) by mouth at bedtime. 02/26/20   Cirigliano, Garvin Fila, DO  fluorometholone (FML) 0.1 % ophthalmic suspension fluorometholone 0.1 % eye drops,suspension    [provider]  fluticasone (FLONASE) 50 MCG/ACT nasal spray Place 1 spray into both nostrils 2 (two) times daily. Aller-flo    [provider]  fluticasone furoate-vilanterol (BREO ELLIPTA) 100-25 MCG/INH AEPB Inhale 1 puff into the lungs daily. 05/24/20   Martyn Ehrich, NP  HYDROcodone-acetaminophen (NORCO/VICODIN) 5-325 MG tablet Take 1 tablet by mouth every 6 (six) hours as needed for moderate pain. 11/03/19   Cirigliano, Garvin Fila, DO  levothyroxine (SYNTHROID) 150 MCG tablet TAKE ONE TABLET BY MOUTH  ONE TIME DAILY  BEFORE BREAKFAST 02/26/20   Cirigliano, Garvin Fila, DO  Loratadine (CLARITIN PO) Take 10 mg by mouth at bedtime. Allertec    [provider]  Melatonin 10 MG TABS Take by mouth at bedtime.    [provider]  NON FORMULARY Take 3 drops by mouth daily. HEMP OIL    [provider]  omeprazole (PRILOSEC) 20 MG capsule Take 1 capsule (20 mg total) by mouth 2 (two) times daily before a meal. 02/26/20   Cirigliano, Mary K, DO  prednisoLONE acetate (PRED FORTE) 1 % ophthalmic suspension prednisolone acetate 1 % eye drops,suspension    [provider]  predniSONE (DELTASONE) 20 MG tablet 3 tabs po daily x 3 days, 2 tabs po daily x 3 days, 1 tab po daily x 3 days, 1/2 tabs po x 3 days 07/14/20   Cirigliano, Garvin Fila, DO  propranolol ER (INDERAL LA) 80 MG 24 hr capsule Take 1 capsule (80 mg total) by mouth daily. 02/26/20   Cirigliano, Garvin Fila, DO  simvastatin (ZOCOR) 40 MG tablet Take 1 tablet (40 mg total) by mouth at bedtime. 02/26/20   Cirigliano, Mary K, DO  traMADol (ULTRAM) 50 MG tablet TAKE ONE TABLET BY MOUTH EVERY TWELVE HOURS AS NEEDED FOR SEVERE PAIN  11/03/19   Cirigliano, Mary K, DO  umeclidinium-vilanterol (ANORO ELLIPTA) 62.5-25 MCG/INH AEPB Inhale 1 puff into the lungs daily. 04/25/20   Rigoberto Noel, MD    Allergies    Patient has no known allergies.  Review of Systems   Review of Systems  Constitutional: Negative for chills and fever.  HENT: Negative for congestion and facial swelling.   Eyes: Negative for discharge and visual disturbance.  Respiratory: Positive for cough and shortness of breath.   Cardiovascular: Negative for chest pain and palpitations.  Gastrointestinal: Negative for abdominal pain, diarrhea, nausea and vomiting.  Musculoskeletal: Negative for arthralgias and myalgias.  Skin: Negative for color change and rash.  Neurological: Positive for light-headedness. Negative for tremors, syncope and headaches.  Psychiatric/Behavioral: Negative for confusion and dysphoric mood.    Physical Exam Updated Vital Signs BP (!) 116/55 (BP Location: Left Arm)   Pulse 73   Temp 97.6 F (36.4 C) (Oral)   Resp 17   Ht 5\' 10"  (1.778 m)   Wt 97.5 kg   SpO2 92%   BMI 30.85 kg/m   Physical Exam Vitals and nursing note reviewed.  Constitutional:      Appearance: Victor [name] is well-developed.  HENT:     Head: Normocephalic and atraumatic.  Eyes:     Pupils: Pupils are equal, round, and reactive to light.  Neck:     Vascular: No JVD.  Cardiovascular:     Rate and Rhythm: Normal rate and regular rhythm.     Heart sounds: No murmur heard.  No friction rub. No gallop.   Pulmonary:     Effort: No respiratory distress.     Breath sounds: No wheezing.  Abdominal:     General: There is no distension.     Tenderness: There is no abdominal tenderness. There is no guarding or rebound.  Musculoskeletal:        General: Normal range of motion.     Cervical back: Normal range of motion and neck supple.  Skin:    Coloration: Skin is not pale.     Findings: No rash.  Neurological:     Mental Status: Victor [name] is alert and oriented to  person, place, and  time.  Psychiatric:        Behavior: Behavior normal.     ED Results / Procedures / Treatments   Labs (all labs ordered are listed, but only abnormal results are displayed) Labs Reviewed  CBC WITH DIFFERENTIAL/PLATELET - Abnormal; Notable for the following components:      Result Value   WBC 44.0 (*)    RBC 2.33 (*)    Hemoglobin 7.3 (*)    HCT 24.3 (*)    MCV 104.3 (*)    RDW 20.0 (*)    Platelets 9 (*)    Lymphs Abs 5.7 (*)    Monocytes Absolute 33.8 (*)    Abs Immature Granulocytes 2.07 (*)    All other components within normal limits  COMPREHENSIVE METABOLIC PANEL - Abnormal; Notable for the following components:   CO2 20 (*)    Glucose, Bld 124 (*)    BUN 52 (*)    Creatinine, Ser 2.48 (*)    Calcium 7.0 (*)    Total Protein 6.0 (*)    Albumin 3.2 (*)    AST 11 (*)    GFR calc non Af Amer 23 (*)    GFR calc Af Amer 27 (*)    Anion gap 17 (*)    All other components within normal limits  RESPIRATORY PANEL BY RT PCR (FLU A&B, COVID)  CULTURE, BLOOD (ROUTINE X 2)  CULTURE, BLOOD (ROUTINE X 2)  BRAIN NATRIURETIC PEPTIDE  PATHOLOGIST SMEAR REVIEW  LACTIC ACID, PLASMA  LACTIC ACID, PLASMA  DIC (DISSEMINATED INTRAVASCULAR COAGULATION) PANEL (NOT AT [facility])  TROPONIN I (HIGH SENSITIVITY)  TROPONIN I (HIGH SENSITIVITY)    EKG EKG Interpretation  Date/Time:  Thursday August 04 2020 15:21:16 EDT Ventricular Rate:  74 PR Interval:    QRS Duration: 101 QT Interval:  420 QTC Calculation: 466 R Axis:   48 Text Interpretation: Sinus rhythm Borderline low voltage, extremity leads No significant change since last tracing Confirmed by Deno Etienne [phone]) on 08/04/2020 4:08:20 PM   Radiology DG Chest Port 1 View  Result Date: 08/04/2020 CLINICAL DATA:  Shortness of breath EXAM: PORTABLE CHEST 1 VIEW COMPARISON:  July 02, 2019 FINDINGS: There is ill-defined airspace opacity in each lung base. Lungs otherwise are clear. Heart is upper normal in size  with pulmonary vascularity normal. No adenopathy. There is aortic atherosclerosis. No bone lesions. IMPRESSION: Ill-defined airspace opacity in the lung bases. This appearance is consistent with patchy bibasilar pneumonia. Lungs elsewhere clear. Note that atypical organism pneumonia may present in this manner. In this regard, advise check of COVID-19 status. Stable cardiac silhouette.  No adenopathy. Aortic Atherosclerosis (ICD10-I70.0). Electronically Signed   By: Lowella Grip III M.D.   On: 08/04/2020 16:30    Procedures Procedures (including critical care time)  Medications Ordered in ED Medications  azithromycin (ZITHROMAX) 500 mg in sodium chloride 0.9 % 250 mL IVPB (500 mg Intravenous New Bag/Given 08/04/20 1850)  cefTRIAXone (ROCEPHIN) 1 g in sodium chloride 0.9 % 100 mL IVPB (0 g Intravenous Stopped 08/04/20 1831)  sodium chloride 0.9 % bolus 1,000 mL ( Intravenous Stopped 08/04/20 1906)    ED Course  I have reviewed the triage vital signs and the nursing notes.  Pertinent labs & imaging results that were available during my care of the patient were reviewed by me and considered in my medical decision making (see chart for details).    MDM Rules/Calculators/A&P  84 yo M with a chief complaints of fatigue lightheadedness hemoptysis.  Going on for a couple weeks.  Patient's oxygen saturation in the upper 80s to low 90s at rest.  Will obtain a laboratory evaluation chest x-ray Covid test.  Chest x-ray concerning for bilateral lower pneumonia.  Will start on antibiotics.  Patient with acute kidney injury.  Given a bolus of fluids.  Also found to have thrombocytopenia and acute anemia.  Patient denies any GI bleeding.  Significant leukocytosis as well.  We will discuss with hospitalist for admission.  I discussed case with Dr. Nevada Crane who was concerned about the patient's laboratory values and suggested that I call critical care.  I discussed case with Dr.  Vaughan Browner, based on my history and physical and the lab work and his vital signs Victor [name] felt this patient should be appropriate for progressive care bed.  Set on arrival if the hospitalist had continued concern then they would be happy to see the patient at bedside.  CRITICAL CARE Performed by: Cecilio Asper   Total critical care time:80 minutes  Critical care time was exclusive of separately billable procedures and treating other patients.  Critical care was necessary to treat or prevent imminent or life-threatening deterioration.  Critical care was time spent personally by me on the following activities: development of treatment plan with patient and/or surrogate as well as nursing, discussions with consultants, evaluation of patient's response to treatment, examination of patient, obtaining history from patient or surrogate, ordering and performing treatments and interventions, ordering and review of laboratory studies, ordering and review of radiographic studies, pulse oximetry and re-evaluation of patient's condition.  The patients results and plan were reviewed and discussed.   Any x-rays performed were independently reviewed by myself.   Differential diagnosis were considered with the presenting HPI.  Medications  azithromycin (ZITHROMAX) 500 mg in sodium chloride 0.9 % 250 mL IVPB (500 mg Intravenous New Bag/Given 08/04/20 1850)  cefTRIAXone (ROCEPHIN) 1 g in sodium chloride 0.9 % 100 mL IVPB (0 g Intravenous Stopped 08/04/20 1831)  sodium chloride 0.9 % bolus 1,000 mL ( Intravenous Stopped 08/04/20 1906)    Vitals:   08/04/20 1630 08/04/20 1700 08/04/20 1831 08/04/20 1930  BP: (!) 118/45 (!) 117/43  (!) 116/55  Pulse: 70 64  73  Resp: 18 16  17   Temp:      TempSrc:      SpO2: (!) 89% (!) 89% 95% 92%  Weight:      Height:        Final diagnoses:  Community acquired pneumonia, unspecified laterality  Thrombocytopenia ([place])  Acute anemia  AKI (acute kidney injury) ([place])     Admission/ observation were discussed with the admitting physician, patient and/or family and they are comfortable with the plan.   Final Clinical Impression(s) / ED Diagnoses Final diagnoses:  Community acquired pneumonia, unspecified laterality  Thrombocytopenia ([place])  Acute anemia  AKI (acute kidney injury) [facility])    Rx / DC Orders ED Discharge Orders    None       Deno Etienne, DO 08/04/20 1946

## 2020-08-04 NOTE — ED Triage Notes (Signed)
Pt sent to Ed by Dr. [name].  Pt was at f/u visit for back pain injections.  Dr. [name] said pt was hypotensive, is weak, unsteady in his gait, and is pale.  These things are markedly different than when he saw him 2 weeks ago. Pt spits up some blood a few times per day.

## 2020-08-04 NOTE — Progress Notes (Signed)
Victor [name] - 84 y.o. male MRN [id number]  Date of birth: 02/16/1936  SUBJECTIVE:  Including CC & ROS.  Chief Complaint  Patient presents with  . Back Pain    left-sided low    Victor [name] is a 84 y.o. male that is presenting with low back pain.  Symptoms been ongoing for a few days.  He is also feeling lightheaded and weak.  He has had these feelings for a few weeks now.  Denies any fevers.  He is having sweating from time to time.  He had a basal cell removed earlier today.   Review of Systems See HPI   HISTORY: Past Medical, Surgical, Social, and Family History Reviewed & Updated per EMR.   Pertinent Historical Findings include:  Past Medical History:  Diagnosis Date  . Arthritis   . Atherosclerosis   . Atherosclerotic PVD with intermittent claudication (HCC)    stent left leg  . BPH (benign prostatic hyperplasia)   . Bulging lumbar disc   . Cancer ([place]) 11/12/14   vocal cord  carcinoma in situ , radiation; thyroid cancer  . Carotid bruit   . Chronic kidney disease    chronic stage III  . COPD (chronic obstructive pulmonary disease) ([place])   . DDD (degenerative disc disease), lumbar   . Dysphonia   . Dysplasia of true vocal cord   . GERD (gastroesophageal reflux disease)   . H/O carotid atherosclerosis    b/l  . Hyperlipidemia   . Hypothyroidism   . Occasional tremors    left hand managed with propranolol  . Peripheral vascular angioplasty status with implants and grafts   . Precancerous lesion 03/05/2018   premelanoma removed from back.   . Radiation 03/10/15- 04/18/16   Larynx  . Sleep apnea    hasn't used CPAP in years  . Spondylosis of lumbosacral region   . Thyroid disease     Past Surgical History:  Procedure Laterality Date  . APPENDECTOMY    . COLONOSCOPY W/ POLYPECTOMY    . DIODE LASER APPLICATION Left 57/32/2025   Procedure: DIODE LASER APPLICATION;  Surgeon: Hayden Pedro, MD;  Location: [facility];  Service: Ophthalmology;  Laterality: Left;  .  MEMBRANE PEEL Left 08/18/2019   Procedure: MEMBRANE PEEL;  Surgeon: Hayden Pedro, MD;  Location: [facility];  Service: Ophthalmology;  Laterality: Left;  . MOUTH SURGERY     tooth extraction  . PARS PLANA VITRECTOMY Left 08/18/2019   PARS PLANA VITRECTOMY 27 GAUGE (Left)  . PARS PLANA VITRECTOMY 27 GAUGE Left 08/18/2019   Procedure: PARS PLANA VITRECTOMY 27 GAUGE;  Surgeon: Hayden Pedro, MD;  Location: [facility];  Service: Ophthalmology;  Laterality: Left;  . PHOTOCOAGULATION WITH LASER Left 08/18/2019   Procedure: PHOTOCOAGULATION WITH LASER;  Surgeon: Hayden Pedro, MD;  Location: [facility];  Service: Ophthalmology;  Laterality: Left;  . THYROID SURGERY    . TONSILLECTOMY    . vocal cord biopsy  11/12/14   squamous cell carcinoma in situ    No family history on file.  Social History   Socioeconomic History  . Marital status: Married    Spouse name: Not on file  . Number of children: Not on file  . Years of education: Not on file  . Highest education level: Not on file  Occupational History  . Not on file  Tobacco Use  . Smoking status: Former Smoker    Packs/day: 2.00    Years: 51.00  Pack years: 102.00    Types: Cigarettes    Start date: 30    Quit date: 03/05/2004    Years since quitting: 16.4  . Smokeless tobacco: Never Used  Vaping Use  . Vaping Use: Never used  Substance and Sexual Activity  . Alcohol use: Yes    Alcohol/week: 7.0 standard drinks    Types: 7 Standard drinks or equivalent per week  . Drug use: No  . Sexual activity: Not on file  Other Topics Concern  . Not on file  Social History Narrative  . Not on file   Social Determinants of Health   Financial Resource Strain:   . Difficulty of Paying Living Expenses: Not on file  Food Insecurity:   . Worried About Charity fundraiser in the Last Year: Not on file  . Ran Out of Food in the Last Year: Not on file  Transportation Needs:   . Lack of Transportation (Medical): Not on file  . Lack of  Transportation (Non-Medical): Not on file  Physical Activity:   . Days of Exercise per Week: Not on file  . Minutes of Exercise per Session: Not on file  Stress:   . Feeling of Stress : Not on file  Social Connections:   . Frequency of Communication with Friends and Family: Not on file  . Frequency of Social Gatherings with Friends and Family: Not on file  . Attends Religious Services: Not on file  . Active Member of Clubs or Organizations: Not on file  . Attends Archivist Meetings: Not on file  . Marital Status: Not on file  Intimate Partner Violence:   . Fear of Current or Ex-Partner: Not on file  . Emotionally Abused: Not on file  . Physically Abused: Not on file  . Sexually Abused: Not on file     PHYSICAL EXAM:  VS: BP (!) 107/49   Pulse 73   Ht 5\' 10"  (1.778 m)   Wt 215 lb (97.5 kg)   SpO2 93%   BMI 30.85 kg/m  Physical Exam Gen: NAD, alert, cooperative with exam,    ASSESSMENT & PLAN:   Chronic left SI joint pain Injection was done a few weeks ago.  He had pain resolution up until 2 days ago.  Having pain in the lower back but feels different than before. -Full supportive care.  Malaise and fatigue He looks pale on exam.  No history of similar symptoms.  Does not appear to have an infection.  He does report spitting up bloody sputum.  He may have a bleed.   -Advised to be seen in the emergency department.  May need to have lab work and possible imaging of CT abdomen pelvis.  Called patient's wife and informed that he was being transported to the emergency department.

## 2020-08-05 ENCOUNTER — Encounter (HOSPITAL_COMMUNITY): Payer: Self-pay | Admitting: Family Medicine

## 2020-08-05 ENCOUNTER — Ambulatory Visit: Payer: Medicare Other | Admitting: Internal Medicine

## 2020-08-05 ENCOUNTER — Inpatient Hospital Stay (HOSPITAL_COMMUNITY): Payer: Medicare Other

## 2020-08-05 DIAGNOSIS — Z85850 Personal history of malignant neoplasm of thyroid: Secondary | ICD-10-CM

## 2020-08-05 DIAGNOSIS — Z85820 Personal history of malignant melanoma of skin: Secondary | ICD-10-CM

## 2020-08-05 DIAGNOSIS — D61818 Other pancytopenia: Secondary | ICD-10-CM

## 2020-08-05 DIAGNOSIS — D72829 Elevated white blood cell count, unspecified: Secondary | ICD-10-CM

## 2020-08-05 DIAGNOSIS — D649 Anemia, unspecified: Secondary | ICD-10-CM | POA: Diagnosis present

## 2020-08-05 DIAGNOSIS — C969 Malignant neoplasm of lymphoid, hematopoietic and related tissue, unspecified: Secondary | ICD-10-CM | POA: Diagnosis present

## 2020-08-05 DIAGNOSIS — J189 Pneumonia, unspecified organism: Secondary | ICD-10-CM

## 2020-08-05 DIAGNOSIS — D696 Thrombocytopenia, unspecified: Secondary | ICD-10-CM | POA: Diagnosis present

## 2020-08-05 DIAGNOSIS — M545 Low back pain, unspecified: Secondary | ICD-10-CM

## 2020-08-05 DIAGNOSIS — N186 End stage renal disease: Secondary | ICD-10-CM

## 2020-08-05 DIAGNOSIS — R102 Pelvic and perineal pain: Secondary | ICD-10-CM

## 2020-08-05 DIAGNOSIS — C9500 Acute leukemia of unspecified cell type not having achieved remission: Secondary | ICD-10-CM | POA: Diagnosis present

## 2020-08-05 DIAGNOSIS — Z8521 Personal history of malignant neoplasm of larynx: Secondary | ICD-10-CM

## 2020-08-05 DIAGNOSIS — N179 Acute kidney failure, unspecified: Secondary | ICD-10-CM | POA: Diagnosis present

## 2020-08-05 DIAGNOSIS — E039 Hypothyroidism, unspecified: Secondary | ICD-10-CM

## 2020-08-05 DIAGNOSIS — J449 Chronic obstructive pulmonary disease, unspecified: Secondary | ICD-10-CM

## 2020-08-05 LAB — DIC (DISSEMINATED INTRAVASCULAR COAGULATION)PANEL
D-Dimer, Quant: 1.69 ug/mL-FEU — ABNORMAL HIGH (ref 0.00–0.50)
D-Dimer, Quant: 2.26 ug/mL-FEU — ABNORMAL HIGH (ref 0.00–0.50)
Fibrinogen: 637 mg/dL — ABNORMAL HIGH (ref 210–475)
Fibrinogen: 684 mg/dL — ABNORMAL HIGH (ref 210–475)
INR: 1.3 — ABNORMAL HIGH (ref 0.8–1.2)
INR: 1.4 — ABNORMAL HIGH (ref 0.8–1.2)
Platelets: 6 10*3/uL — CL (ref 150–400)
Platelets: 6 10*3/uL — CL (ref 150–400)
Prothrombin Time: 16 seconds — ABNORMAL HIGH (ref 11.4–15.2)
Prothrombin Time: 16.7 seconds — ABNORMAL HIGH (ref 11.4–15.2)
Smear Review: NONE SEEN
Smear Review: NONE SEEN
aPTT: 44 seconds — ABNORMAL HIGH (ref 24–36)
aPTT: 51 seconds — ABNORMAL HIGH (ref 24–36)

## 2020-08-05 LAB — COMPREHENSIVE METABOLIC PANEL
ALT: 10 U/L (ref 0–44)
AST: 9 U/L — ABNORMAL LOW (ref 15–41)
Albumin: 2.9 g/dL — ABNORMAL LOW (ref 3.5–5.0)
Alkaline Phosphatase: 43 U/L (ref 38–126)
Anion gap: 15 (ref 5–15)
BUN: 48 mg/dL — ABNORMAL HIGH (ref 8–23)
CO2: 19 mmol/L — ABNORMAL LOW (ref 22–32)
Calcium: 6.5 mg/dL — ABNORMAL LOW (ref 8.9–10.3)
Chloride: 108 mmol/L (ref 98–111)
Creatinine, Ser: 2.06 mg/dL — ABNORMAL HIGH (ref 0.61–1.24)
GFR calc Af Amer: 34 mL/min — ABNORMAL LOW (ref >60)
GFR calc non Af Amer: 29 mL/min — ABNORMAL LOW (ref >60)
Glucose, Bld: 108 mg/dL — ABNORMAL HIGH (ref 70–99)
Potassium: 3.7 mmol/L (ref 3.5–5.1)
Sodium: 142 mmol/L (ref 135–145)
Total Bilirubin: 1.1 mg/dL (ref 0.3–1.2)
Total Protein: 5.4 g/dL — ABNORMAL LOW (ref 6.5–8.1)

## 2020-08-05 LAB — URINALYSIS, ROUTINE W REFLEX MICROSCOPIC
Bilirubin Urine: NEGATIVE
Glucose, UA: NEGATIVE mg/dL
Ketones, ur: NEGATIVE mg/dL
Leukocytes,Ua: NEGATIVE
Nitrite: NEGATIVE
Protein, ur: NEGATIVE mg/dL
Specific Gravity, Urine: 1.012 (ref 1.005–1.030)
pH: 5 (ref 5.0–8.0)

## 2020-08-05 LAB — CBC
HCT: 19.2 % — ABNORMAL LOW (ref 39.0–52.0)
HCT: 25.4 % — ABNORMAL LOW (ref 39.0–52.0)
Hemoglobin: 5.7 g/dL — CL (ref 13.0–17.0)
Hemoglobin: 7.9 g/dL — ABNORMAL LOW (ref 13.0–17.0)
MCH: 31.8 pg (ref 26.0–34.0)
MCH: 31.2 pg (ref 26.0–34.0)
MCHC: 29.7 g/dL — ABNORMAL LOW (ref 30.0–36.0)
MCHC: 31.1 g/dL (ref 30.0–36.0)
MCV: 107.3 fL — ABNORMAL HIGH (ref 80.0–100.0)
MCV: 100.4 fL — ABNORMAL HIGH (ref 80.0–100.0)
Platelets: 12 10*3/uL — CL (ref 150–400)
Platelets: 17 10*3/uL — CL (ref 150–400)
RBC: 1.79 MIL/uL — ABNORMAL LOW (ref 4.22–5.81)
RBC: 2.53 MIL/uL — ABNORMAL LOW (ref 4.22–5.81)
RDW: 19.9 % — ABNORMAL HIGH (ref 11.5–15.5)
RDW: 22.9 % — ABNORMAL HIGH (ref 11.5–15.5)
WBC: 23.2 10*3/uL — ABNORMAL HIGH (ref 4.0–10.5)
WBC: 23.8 10*3/uL — ABNORMAL HIGH (ref 4.0–10.5)
nRBC: 0.1 % (ref 0.0–0.2)
nRBC: 0.3 % — ABNORMAL HIGH (ref 0.0–0.2)

## 2020-08-05 LAB — BASIC METABOLIC PANEL
Anion gap: 13 (ref 5–15)
BUN: 43 mg/dL — ABNORMAL HIGH (ref 8–23)
CO2: 20 mmol/L — ABNORMAL LOW (ref 22–32)
Calcium: 6.6 mg/dL — ABNORMAL LOW (ref 8.9–10.3)
Chloride: 109 mmol/L (ref 98–111)
Creatinine, Ser: 1.71 mg/dL — ABNORMAL HIGH (ref 0.61–1.24)
GFR calc Af Amer: 42 mL/min — ABNORMAL LOW (ref >60)
GFR calc non Af Amer: 36 mL/min — ABNORMAL LOW (ref >60)
Glucose, Bld: 113 mg/dL — ABNORMAL HIGH (ref 70–99)
Potassium: 3.8 mmol/L (ref 3.5–5.1)
Sodium: 142 mmol/L (ref 135–145)

## 2020-08-05 LAB — PATHOLOGIST SMEAR REVIEW

## 2020-08-05 LAB — ABO/RH: ABO/RH(D): A POS

## 2020-08-05 LAB — HIV ANTIBODY (ROUTINE TESTING W REFLEX): HIV Screen 4th Generation wRfx: NONREACTIVE

## 2020-08-05 LAB — SAVE SMEAR(SSMR), FOR PROVIDER SLIDE REVIEW

## 2020-08-05 LAB — LACTATE DEHYDROGENASE: LDH: 320 U/L — ABNORMAL HIGH (ref 98–192)

## 2020-08-05 LAB — URIC ACID: Uric Acid, Serum: 15.5 mg/dL — ABNORMAL HIGH (ref 3.7–8.6)

## 2020-08-05 LAB — PREPARE RBC (CROSSMATCH)

## 2020-08-05 LAB — PROCALCITONIN: Procalcitonin: <0.1 ng/mL

## 2020-08-05 MED ORDER — HYDROCODONE-ACETAMINOPHEN 5-325 MG PO TABS
1.0000 | ORAL_TABLET | ORAL | Status: DC | PRN
  Administered 2020-08-05 – 2020-08-06 (×4): 1 via ORAL
  Filled 2020-08-05 (×3): qty 1

## 2020-08-05 MED ORDER — IPRATROPIUM-ALBUTEROL 0.5-2.5 (3) MG/3ML IN SOLN
3.0000 mL | RESPIRATORY_TRACT | Status: DC | PRN
  Administered 2020-08-05: 3 mL via RESPIRATORY_TRACT
  Filled 2020-08-05: qty 3

## 2020-08-05 MED ORDER — SODIUM CHLORIDE 0.9 % IV SOLN
2.0000 g | INTRAVENOUS | Status: DC
  Administered 2020-08-05: 2 g via INTRAVENOUS
  Filled 2020-08-05: qty 2

## 2020-08-05 MED ORDER — SODIUM CHLORIDE 0.9 % IV SOLN
500.0000 mg | INTRAVENOUS | Status: DC
  Administered 2020-08-05: 500 mg via INTRAVENOUS
  Filled 2020-08-05: qty 500

## 2020-08-05 MED ORDER — HYDROCODONE-ACETAMINOPHEN 5-325 MG PO TABS
1.0000 | ORAL_TABLET | Freq: Four times a day (QID) | ORAL | Status: DC | PRN
  Administered 2020-08-05: 1 via ORAL
  Filled 2020-08-05 (×2): qty 1

## 2020-08-05 NOTE — Progress Notes (Signed)
Received call from [place] at [facility] stating that patient has been accepted and can be transferred once a bed is available, updated patient and spouse

## 2020-08-05 NOTE — Consult Note (Signed)
New Hematology/Oncology Consult   Requesting RU:[id number], Victor [name]       Reason for Consult: Leukocytosis, anemia, thrombocytopenia  HPI: Victor [name] presented to the emergency room with a complaint of severe left lower back pain for the past few days.  He has a history of chronic back pain treated with "injection "therapy in the past.  He saw his sports medicine physician today and was referred to the emergency room.  He reports malaise.  He has a cough and dyspnea.  He was noted to have severe anemia, severe thrombocytopenia, and leukocytosis.  There is airspace opacity of the lung bases on chest x-ray.  He was transferred to [facility] for admission. He had a skin cancer removed from his today.   Past Medical History:  Diagnosis Date  . Arthritis   . Atherosclerosis   . Atherosclerotic PVD with intermittent claudication (HCC)    stent left leg  . BPH (benign prostatic hyperplasia)   . Bulging lumbar disc   . Cancer ([place]) 11/12/14   vocal cord  carcinoma in situ , radiation; thyroid cancer  . Carotid bruit   . Chronic kidney disease    chronic stage III  . COPD (chronic obstructive pulmonary disease) ([place])   . DDD (degenerative disc disease), lumbar   . Dysphonia   . Dysplasia of true vocal cord   . GERD (gastroesophageal reflux disease)   . H/O carotid atherosclerosis    b/l  . Hyperlipidemia   . Hypothyroidism   . Occasional tremors    left hand managed with propranolol  . Peripheral vascular angioplasty status with implants and grafts   . Precancerous lesion 03/05/2018   premelanoma removed from back.   . Radiation 03/10/15- 04/18/16   Larynx  . Sleep apnea    hasn't used CPAP in years  . Spondylosis of lumbosacral region   . Thyroid disease   :  Past Surgical History:  Procedure Laterality Date  . APPENDECTOMY    . COLONOSCOPY W/ POLYPECTOMY    . DIODE LASER APPLICATION Left 19/14/7829   Procedure: DIODE LASER APPLICATION;  Surgeon: Victor Pedro, MD;   Location: [place];  Service: Ophthalmology;  Laterality: Left;  . MEMBRANE PEEL Left 08/18/2019   Procedure: MEMBRANE PEEL;  Surgeon: Victor Pedro, MD;  Location: [place];  Service: Ophthalmology;  Laterality: Left;  . MOUTH SURGERY     tooth extraction  . PARS PLANA VITRECTOMY Left 08/18/2019   PARS PLANA VITRECTOMY 27 GAUGE (Left)  . PARS PLANA VITRECTOMY 27 GAUGE Left 08/18/2019   Procedure: PARS PLANA VITRECTOMY 27 GAUGE;  Surgeon: Victor Pedro, MD;  Location: [place];  Service: Ophthalmology;  Laterality: Left;  . PHOTOCOAGULATION WITH LASER Left 08/18/2019   Procedure: PHOTOCOAGULATION WITH LASER;  Surgeon: Victor Pedro, MD;  Location: [place];  Service: Ophthalmology;  Laterality: Left;  . THYROID SURGERY    . TONSILLECTOMY    . vocal cord biopsy  11/12/14   squamous cell carcinoma in situ  :   Current Facility-Administered Medications:  .  azithromycin (ZITHROMAX) 500 mg in sodium chloride 0.9 % 250 mL IVPB, 500 mg, Intravenous, Q24H, Gardner, Jared M, DO .  cefTRIAXone (ROCEPHIN) 2 g in sodium chloride 0.9 % 100 mL IVPB, 2 g, Intravenous, Q24H, Victor [name], Jared M, DO:  :  No Known Allergies:  FH: His father had colon cancer.  His sister had breast cancer.  SOCIAL HISTORY: He lives with his wife in [place].  He is retired.  He worked in [place].  He smoked in the remote past.  He has 1 liquor drink per day.  No transfusion history.  No risk factor for HIV or hepatitis.  Review of Systems:  Positives include: Left lower back pain, cough, malaise  A complete ROS was otherwise negative.   Physical Exam:  Blood pressure (!) 129/49, pulse 75, temperature 98.1 F (36.7 C), temperature source Oral, resp. rate (!) 22, height 5' 10"  (1.778 [name]), weight 215 lb (97.5 kg), SpO2 95 %.  HEENT: No thrush, few petechiae at the palate, neck without mass Lungs: Inspiratory rhonchi at the lower posterior chest bilaterally, no respiratory distress Cardiac: Distant heart  sounds, regular rate and rhythm Abdomen: No hepatosplenomegaly  Vascular: No leg edema Lymph nodes: No cervical, supraclavicular, axillary, or inguinal nodes Neurologic: Alert and oriented, motor exam appears intact in the upper and lower extremities bilaterally Skin: Few ecchymoses over the trunk and extremities, petechiae at the lower legs bilaterally Musculoskeletal: No spine or iliac tenderness  LABS:  Recent Labs    08/04/20 1523 08/04/20 1523 08/04/20 1928 08/04/20 2310  WBC 44.0*  --   --  28.0*  HGB 7.3*  --   --  6.1*  HCT 24.3*  --   --  20.6*  PLT 9*   < > 6* 7*   < > = values in this interval not displayed.  Blood smear: There are ovalocytes and teardrops, rare nucleated red cell, the the polychromasia is not increased.  The platelets are markedly decreased in number.  No platelet clumps.  The white cells are increased with the majority being mononuclear cells.  Some of the cells have plasmacytoid features, others have granules and folded nuclei.  No Auer rods.     Recent Labs    08/04/20 1523  NA 140  K 4.0  CL 103  CO2 20*  GLUCOSE 124*  BUN 52*  CREATININE 2.48*  CALCIUM 7.0*      RADIOLOGY:  DG Chest Port 1 View  Result Date: 08/04/2020 CLINICAL DATA:  Shortness of breath EXAM: PORTABLE CHEST 1 VIEW COMPARISON:  July 02, 2019 FINDINGS: There is ill-defined airspace opacity in each lung base. Lungs otherwise are clear. Heart is upper normal in size with pulmonary vascularity normal. No adenopathy. There is aortic atherosclerosis. No bone lesions. IMPRESSION: Ill-defined airspace opacity in the lung bases. This appearance is consistent with patchy bibasilar pneumonia. Lungs elsewhere clear. Note that atypical organism pneumonia may present in this manner. In this regard, advise check of COVID-19 status. Stable cardiac silhouette.  No adenopathy. Aortic Atherosclerosis (ICD10-I70.0). Electronically Signed   By: Victor [name] III [name].D.   On: 08/04/2020  16:30    Assessment and Plan:   1.  Leukocytosis, anemia, thrombocytopenia 2.  Cough/dyspnea with chest x-ray findings concerning for pneumonia 3.  Left lower back/pelvic pain 4.  COPD 5.  Remote history of thyroid cancer 6.  History of larynx cancer 7.  Renal failure 8.  Removal of basal cell carcinoma from the nose 08/04/2020 9.  Coagulopathy  Mr. Zietlow presents with severe anemia/thrombocytopenia and leukocytosis.  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  There is no clear defining feature of the mononuclear cells on review of the peripheral blood smear this morning.  The differential diagnosis includes a plasma cell malignancy, acute leukemia, and a circulating lymphoma.  The blood smear does not appear typical of APL, but this is in the differential diagnosis with the coagulopathy.  He may have pneumonia.  The plan is to provide supportive care with platelet and red cell transfusions tonight.  I will initiate additional diagnostic evaluation to include a bone marrow biopsy.  Recommendations: 1.  Transfuse 1 unit of platelets  2.   Transfuse packed red blood cells 3.   Bone marrow biopsy 4.   LDH, uric acid 5.   Myeloma panel and serum light chains 6.   Bone survey 7.  Antibiotics for pneumonia    Hematology/oncology will see him again on 08/05/2020.   Betsy Coder, MD 08/05/2020, 12:37 AM

## 2020-08-05 NOTE — Progress Notes (Signed)
Chaplain responded to referral from pt's nurse who thought pt might benefit from a visit from [place].  Nurse reports pt has just received some not-so-good news and is tearful.  Pt welcomed Chaplain into room.  Wife was at his bedside. Chaplain established a relationship of care, listening to the pt's concerns, mainly of the intractable pain in his back.  Pt identified as a life-long [employer].  Chaplain prayed with pt and wife at bedside.  De [place]rs Staff Chaplain

## 2020-08-05 NOTE — H&P (Signed)
History and Physical    BERNHARDT RIEMENSCHNEIDER NWG:[id number] DOB: April 09, 1936 DOA: 08/04/2020  PCP: Ronnald Nian, DO  Patient coming from: Home  I have personally briefly reviewed patient's old medical records in [place]  Chief Complaint: Low BP, pale  HPI: Victor [name] is a 84 y.o. male with medical history significant of vocal cord carcinoma s/p radiation, thyroid CA s/p surgery, skin cancers s/p surgery and basal cell carcinoma of the nose that was actually just removed today.  Pt has been having severe L lower back pain for the past few days.  H/o chronic back pain treated with injections in past.  Saw sports med physician today, who felt he looked pale, weak, and generally not well.  Sent patient to [facility] for further evaluation.  Pt does report chronic cough he relates to post nasal drip.  Notes it has been productive of pinkish sputum recently.  No fevers, chills.  Has had decreased UOP recently.   ED Course: work up in the ED revealed WBC 44k, HGB 7.3, platelets of 9 (last year all of these values were WNL).  Creat of 2.48 (1.0 baseline).  CXR showing bibasilar PNA findings.  BNP nl, trop nl x2.  On differential, only 2.1k neutrophils, 33.8k monocytes with "atypical monocytes" noted on smear.  No schistiocytes on DIC panel.  COVID is neg.  Pt given 1L IVF bolus, given rocephin and azithro, and transferred to [facility] for admission.   Review of Systems: As per HPI, otherwise all review of systems negative.  Past Medical History:  Diagnosis Date  . Arthritis   . Atherosclerosis   . Atherosclerotic PVD with intermittent claudication (HCC)    stent left leg  . BPH (benign prostatic hyperplasia)   . Bulging lumbar disc   . Cancer ([place]) 11/12/14   vocal cord  carcinoma in situ , radiation; thyroid cancer  . Carotid bruit   . Chronic kidney disease    chronic stage III  . COPD (chronic obstructive pulmonary disease) ([place])   . DDD (degenerative disc disease), lumbar    . Dysphonia   . Dysplasia of true vocal cord   . GERD (gastroesophageal reflux disease)   . H/O carotid atherosclerosis    b/l  . Hyperlipidemia   . Hypothyroidism   . Occasional tremors    left hand managed with propranolol  . Peripheral vascular angioplasty status with implants and grafts   . Precancerous lesion 03/05/2018   premelanoma removed from back.   . Radiation 03/10/15- 04/18/16   Larynx  . Sleep apnea    hasn't used CPAP in years  . Spondylosis of lumbosacral region   . Thyroid disease     Past Surgical History:  Procedure Laterality Date  . APPENDECTOMY    . COLONOSCOPY W/ POLYPECTOMY    . DIODE LASER APPLICATION Left 57/84/6962   Procedure: DIODE LASER APPLICATION;  Surgeon: Hayden Pedro, MD;  Location: [place];  Service: Ophthalmology;  Laterality: Left;  . MEMBRANE PEEL Left 08/18/2019   Procedure: MEMBRANE PEEL;  Surgeon: Hayden Pedro, MD;  Location: [place];  Service: Ophthalmology;  Laterality: Left;  . MOUTH SURGERY     tooth extraction  . PARS PLANA VITRECTOMY Left 08/18/2019   PARS PLANA VITRECTOMY 27 GAUGE (Left)  . PARS PLANA VITRECTOMY 27 GAUGE Left 08/18/2019   Procedure: PARS PLANA VITRECTOMY 27 GAUGE;  Surgeon: Hayden Pedro, MD;  Location: [place];  Service: Ophthalmology;  Laterality: Left;  . PHOTOCOAGULATION WITH  LASER Left 08/18/2019   Procedure: PHOTOCOAGULATION WITH LASER;  Surgeon: Hayden Pedro, MD;  Location: [place];  Service: Ophthalmology;  Laterality: Left;  . THYROID SURGERY    . TONSILLECTOMY    . vocal cord biopsy  11/12/14   squamous cell carcinoma in situ     reports that he quit smoking about 16 years ago. His smoking use included cigarettes. He started smoking about 67 years ago. He has a 102.00 pack-year smoking history. He has never used smokeless tobacco. He reports current alcohol use of about 7.0 standard drinks of alcohol per week. He reports that he does not use drugs.  No Known Allergies  Family History    Problem Relation Age of Onset  . Colon cancer Father   . Cancer Sister        Died of metastatic cancer  . Leukemia Neg Hx      Prior to Admission medications   Medication Sig Start Date End Date Taking? Authorizing Provider  albuterol (VENTOLIN HFA) 108 (90 Base) MCG/ACT inhaler Inhale 1-2 puffs into the lungs every 6 (six) hours as needed for wheezing or shortness of breath.   Yes [provider]  doxazosin (CARDURA) 2 MG tablet Take 1 tablet (2 mg total) by mouth at bedtime. 02/26/20  Yes Cirigliano, Mary K, DO  HYDROcodone-acetaminophen (NORCO/VICODIN) 5-325 MG tablet Take 1 tablet by mouth every 6 (six) hours as needed for moderate pain. 11/03/19  Yes Cirigliano, Mary K, DO  levothyroxine (SYNTHROID) 150 MCG tablet TAKE ONE TABLET BY MOUTH ONE TIME DAILY  BEFORE BREAKFAST Patient taking differently: Take 150 mcg by mouth daily before breakfast.  02/26/20  Yes Cirigliano, Mary K, DO  Loratadine (CLARITIN PO) Take 10 mg by mouth at bedtime. Allertec   Yes [provider]  Melatonin 10 MG TABS Take 10 mg by mouth at bedtime as needed (sleep).    Yes [provider]  omeprazole (PRILOSEC) 20 MG capsule Take 1 capsule (20 mg total) by mouth 2 (two) times daily before a meal. 02/26/20  Yes Cirigliano, Mary K, DO  propranolol ER (INDERAL LA) 80 MG 24 hr capsule Take 1 capsule (80 mg total) by mouth daily. 02/26/20  Yes Cirigliano, Mary K, DO  simvastatin (ZOCOR) 40 MG tablet Take 1 tablet (40 mg total) by mouth at bedtime. 02/26/20  Yes Cirigliano, Mary K, DO  fluticasone furoate-vilanterol (BREO ELLIPTA) 100-25 MCG/INH AEPB Inhale 1 puff into the lungs daily. Patient not taking: Reported on 08/04/2020 05/24/20   Martyn Ehrich, NP  predniSONE (DELTASONE) 20 MG tablet 3 tabs po daily x 3 days, 2 tabs po daily x 3 days, 1 tab po daily x 3 days, 1/2 tabs po x 3 days Patient not taking: Reported on 08/04/2020 07/14/20   Cirigliano, Garvin Fila, DO  traMADol (ULTRAM) 50 MG tablet  TAKE ONE TABLET BY MOUTH EVERY TWELVE HOURS AS NEEDED FOR SEVERE PAIN Patient not taking: Reported on 08/04/2020 11/03/19   Cirigliano, Garvin Fila, DO  umeclidinium-vilanterol (ANORO ELLIPTA) 62.5-25 MCG/INH AEPB Inhale 1 puff into the lungs daily. Patient not taking: Reported on 08/04/2020 04/25/20   Rigoberto Noel, MD    Physical Exam: Vitals:   08/04/20 1930 08/04/20 2100 08/04/20 2130 08/04/20 2239  BP: (!) 116/55 (!) 119/44 (!) 117/43 (!) 129/49  Pulse: 73 76 78 75  Resp: 17 (!) 24 18 (!) 22  Temp:    98.1 F (36.7 C)  TempSrc:    Oral  SpO2: 92% 94%  94% 95%  Weight:      Height:        Constitutional: NAD, calm, comfortable Eyes: PERRL, lids and conjunctivae normal ENMT: Mucous membranes are moist. Posterior pharynx clear of any exudate or lesions.Normal dentition.  Neck: normal, supple, no masses, no thyromegaly Respiratory: clear to auscultation bilaterally, no wheezing, no crackles. Normal respiratory effort. No accessory muscle use.  Cardiovascular: Regular rate and rhythm, no murmurs / rubs / gallops. No extremity edema. 2+ pedal pulses. No carotid bruits.  Abdomen: no tenderness, no masses palpated. No hepatosplenomegaly. Bowel sounds positive.  Musculoskeletal: no clubbing / cyanosis. No joint deformity upper and lower extremities. Good ROM, no contractures. Normal muscle tone.  Skin: Some bruising of upper extremities, some petechiae of feet. Neurologic: CN 2-12 grossly intact. Sensation intact, DTR normal. Strength 5/5 in all 4.  Psychiatric: Normal judgment and insight. Alert and oriented x 3. Normal mood.    Labs on Admission: I have personally reviewed following labs and imaging studies  CBC: Recent Labs  Lab 08/04/20 1523 08/04/20 1928 08/04/20 2310 08/05/20 0012  WBC 44.0*  --  28.0*  --   NEUTROABS 2.1  --   --   --   HGB 7.3*  --  6.1*  --   HCT 24.3*  --  20.6*  --   MCV 104.3*  --  106.7*  --   PLT 9* 6* 7* 6*   Basic Metabolic Panel: Recent Labs    Lab 08/04/20 1523  NA 140  K 4.0  CL 103  CO2 20*  GLUCOSE 124*  BUN 52*  CREATININE 2.48*  CALCIUM 7.0*   GFR: Estimated Creatinine Clearance: 26.9 mL/min (A) (by C-G formula based on SCr of 2.48 mg/dL (H)). Liver Function Tests: Recent Labs  Lab 08/04/20 1523  AST 11*  ALT 9  ALKPHOS 43  BILITOT 0.7  PROT 6.0*  ALBUMIN 3.2*   No results for input(s): LIPASE, AMYLASE in the last 168 hours. No results for input(s): AMMONIA in the last 168 hours. Coagulation Profile: Recent Labs  Lab 08/04/20 1928 08/05/20 0012  INR 1.4* PENDING   Cardiac Enzymes: No results for input(s): CKTOTAL, CKMB, CKMBINDEX, TROPONINI in the last 168 hours. BNP (last 3 results) No results for input(s): PROBNP in the last 8760 hours. HbA1C: No results for input(s): HGBA1C in the last 72 hours. CBG: No results for input(s): GLUCAP in the last 168 hours. Lipid Profile: No results for input(s): CHOL, HDL, LDLCALC, TRIG, CHOLHDL, LDLDIRECT in the last 72 hours. Thyroid Function Tests: No results for input(s): TSH, T4TOTAL, FREET4, T3FREE, THYROIDAB in the last 72 hours. Anemia Panel: No results for input(s): VITAMINB12, FOLATE, FERRITIN, TIBC, IRON, RETICCTPCT in the last 72 hours. Urine analysis: No results found for: COLORURINE, APPEARANCEUR, [place], [place], [place], [place], BILIRUBINUR, [place], PROTEINUR, [place], NITRITE, LEUKOCYTESUR  Radiological Exams on Admission: DG Chest Port 1 View  Result Date: 08/04/2020 CLINICAL DATA:  Shortness of breath EXAM: PORTABLE CHEST 1 VIEW COMPARISON:  July 02, 2019 FINDINGS: There is ill-defined airspace opacity in each lung base. Lungs otherwise are clear. Heart is upper normal in size with pulmonary vascularity normal. No adenopathy. There is aortic atherosclerosis. No bone lesions. IMPRESSION: Ill-defined airspace opacity in the lung bases. This appearance is consistent with patchy bibasilar pneumonia. Lungs elsewhere clear. Note that  atypical organism pneumonia may present in this manner. In this regard, advise check of COVID-19 status. Stable cardiac silhouette.  No adenopathy. Aortic Atherosclerosis (ICD10-I70.0). Electronically Signed   By:  Lowella Grip III M.D.   On: 08/04/2020 16:30    EKG: Independently reviewed.  Assessment/Plan Principal Problem:   Hematologic malignancy (HCC) Active Problems:   History of glottic cancer   History of thyroid cancer   Chronic kidney disease, stage 2 (mild)   COPD, moderate (HCC)   CAP (community acquired pneumonia)   Thrombocytopenia ([place])   Anemia   AKI (acute kidney injury) ([place])    1. Acute hematologic malignancy - with suspected bone marrow suppression, anemia, thrombocytopenia. 1. See Dr. Gearldine Shown note 2. Doesn't recommend emergent transfer at this time 3. Supportive care for the moment 2. Anemia - 1. Transfusing 2u PRBC 2. Repeat CBC in AM 3. Thrombocytopenia 1. Transfusing 1u platelets 2. Repeat CBC in AM 3. DIC panel repeat pending (didn't ever get a result on fibrinogen on the first one). 4. No schistocytes, fibrinogen 684, DIC felt unlikely. 4. CAP - 1. Sepsis felt less likely at this time, CBC abnormalities felt to be more related to some form of acute hematologic malignancy.  Pt with 33k "monocytes" on smear, atypical cells but these are definitely not the neutrophils we would expect to see in bacterial sepsis.  Smear has been reviewed by Dr. Benay Spice. 2. 1L IVF bolus in ED 3. Giving blood and platelets at this time 4. PNA pathway 5. Rocephin / azithro 6. Minimal O2 requirement. 7. COVID neg 5. AKI on CKD stage 2 - 1. Pre-renal due to hypotension earlier today? 2. Undiagnosed multiple myeloma? 3. Strict intake and output 4. Renal US pending to r/o obstruction or stone 5. UA ordered 6. Repeat BMP in AM  DVT prophylaxis: SCDs Code Status: Full Family Communication: No family in room Disposition Plan: TBD, needs diagnosis and treatment of  suspected hematologic malignancy causing anemia and thrombocytopenia Consults called: Dr. Benay Spice has seen patient as urgent consult already Admission status: Admit to inpatient  Severity of Illness: The appropriate patient status for this patient is INPATIENT. Inpatient status is judged to be reasonable and necessary in order to provide the required intensity of service to ensure the patient's safety. The patient's presenting symptoms, physical exam findings, and initial radiographic and laboratory data in the context of their chronic comorbidities is felt to place them at high risk for further clinical deterioration. Furthermore, it is not anticipated that the patient will be medically stable for discharge from the hospital within 2 midnights of admission. The following factors support the patient status of inpatient.   IP status due to severe anemia, thrombocytopenia, and suspected acute hematologic malignancy of some form.  Also has AKI with creat of 2.4 up from 1.0 baseline.  And a new 2L O2 requirement with CXR suspicious for bilateral PNA.   * I certify that at the point of admission it is my clinical judgment that the patient will require inpatient hospital care spanning beyond 2 midnights from the point of admission due to high intensity of service, high risk for further deterioration and high frequency of surveillance required.*    Manford Sprong M. DO [facility]  How to contact the [facility] Attending or Consulting provider [place] or covering provider during after hours [place], for this patient?  1. Check the care team in [facility] and look for a) attending/consulting [facility] provider listed and b) the [facility] team listed 2. Log into [email or web address]  Amion Physician Scheduling and messaging for groups and whole hospitals  On call and physician scheduling software for group practices, residents, hospitalists and other medical providers  for call, clinic, rotation and shift schedules. OnCall  [employer] is a hospital-wide system for scheduling doctors and paging doctors on call. EasyPlot is for scientific plotting and data analysis.  [email or web address]  and use [place]'s universal password to access. If you do not have the password, please contact the hospital operator.  3. Locate the [facility] provider you are looking for under [facility] and page to a number that you can be directly reached. 4. If you still have difficulty reaching the provider, please page the [facility] (Director on Call) for the Hospitalists listed on amion for assistance.  08/05/2020, 12:47 AM

## 2020-08-05 NOTE — Discharge Summary (Signed)
Discharge Summary  Victor [name] HQI:[id number] DOB: [birth year]  PCP: Ronnald Nian, DO  Admit date: 08/04/2020 Discharge date: 08/05/2020  Time spent: 35 minutes  Recommendations for Follow-up:  1. Patient being transferred to [facility] to the oncology service 2. He is status post receiving 2 units packed red blood cells.  Would recommend repeat CBC and basic metabolic panel upon arrival to decide on further blood and platelet transfusions  Discharge Diagnoses:  Active Hospital Problems   Diagnosis Date Noted  . 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 Thrombocytopenia ([place]) 08/05/2020  . Anemia 08/05/2020  . AKI (acute kidney injury) ([place]) 08/05/2020  . Hematologic malignancy ([place]) 08/05/2020  . CAP (community acquired pneumonia) 08/04/2020  . COPD, moderate ([place]) 05/24/2020  . 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 History of thyroid cancer 03/26/2014  . Chronic kidney disease, stage 2 (mild) 03/17/2014    Resolved Hospital Problems  No resolved problems to display.    Discharge Condition: Critically ill  Diet recommendation: N.p.o. until after biopsy  Vitals:   08/05/20 1113 08/05/20 1246  BP: (!) 138/50 (!) 136/55  Pulse: 80 70  Resp:    Temp: 98.8 F (37.1 C) 98.9 F (37.2 C)  SpO2: 94% 96%    History of present illness:  Patient is an 84 year old male with past medical history significant for vocal cord carcinoma status post radiation, thyroid cancer status post surgeries and multiple skin cancers status post removal including a basal cell carcinoma of his nose that was actually removed earlier in the day of 9/30.  Patient had been having severe left lower back pain for the past few days and when he saw his sports medicine physician on 9/30 in follow-up, he looked to be quite pale and was sent over to the emergency room for further evaluation.  Lab work in the emergency room revealed a creatinine of 2.48 (1.0 baseline), hemoglobin of 7.3,  platelet count of 9 and a white count of 44 with all of these labs being normal last year.  Chest x-ray noted questionable bibasilar pneumonia findings.  On differential, patient was noted to have 33.8 thousand monocytes with atypical monocytes noted on smear and no schistocytes on DIC panel.  Patient was given IV fluids, antibiotics for possible pneumonia and transferred from [place] to [facility] to the hospitalist service for further evaluation.  Oncology was consulted and review felt that he likely had a hematologic malignancy process such as leukemia.  Following admission to the hospital, patient was transfused 2 unit packed red blood cells.  Prior to transfusion, patient had repeat blood work following IV fluids and creatinine had improved to 2.06 and his hemoglobin had dropped to 5.7.  White blood cell count down to 23.2.  Uric acid elevated at 15.5 likely from high cell turnover which would account for his renal function.  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  Case was discussed with oncology at [facility] who given patient's acute severity of symptoms felt best to transfer there.  Initial plans for bone marrow biopsy put on hold as biopsy will be done at 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  Has already improved with some IV fluids.  Recommend checking repeat  basic metabolic panel and CBC upon arrival and will likely need more fluids and blood. Active Problems:   Chronic kidney disease, stage 2 (mild): Reportedly creatinine at 1 last year.  Continue fluids    COPD, moderate ([place]): Stable during his hospitalization.  Abnormal chest x-ray: Patient received a dose of Rocephin and Zithromax when noted to have elevated  white blood cell count, however procalcitonin level less than 0.1, so antibiotics stopped.  No signs of infection otherwise.    Thrombocytopenia ([place]): Due to decreased production in the setting of malignancy.  Status post transfusion of platelets.    Anemia: Likely in the setting of decreased RBC production in the bone marrow while making increased immature white blood cells from his malignancy.  Status post transfusion 2 unit packed red blood cells.  May benefit from more.  Hyperlipidemia: Statin initially held while patient's work-up ongoing.  Hypothyroidism: Continue Synthroid.    AKI (acute kidney injury) ([place]): As above.  Continue fluids.  BC: Patient meets criteria BMI greater than 30.  Procedures:  Status post transfusion 2 unit packed red blood cells done 10/1  Consultations:  Oncology  Discharge Exam: BP (!) 136/55 (BP Location: Right Arm)   Pulse 70   Temp 98.9 F (37.2 C) (Oral)   Resp 20   Ht 5' 10"  (1.778 m)   Wt 97.5 kg   SpO2 96%   BMI 30.85 kg/m   General: Fatigued, alert and oriented x3, mild distress secondary to shortness of breath and pain Cardiovascular: Regular rate and rhythm, S1-S2 Respiratory: Decreased breath sounds throughout  Discharge Instructions You were cared for by a hospitalist during your hospital stay. If you have any questions about your discharge medications or the care you received while you were in the hospital after you are discharged, you can call the unit and asked to speak with the hospitalist on call if the hospitalist that took care of you is not available. Once you are discharged, your primary care physician will handle any further medical issues. Please note that NO REFILLS for any discharge medications will be authorized once you are discharged, as it is imperative that you return to your primary care physician (or establish a relationship with a primary care physician if you do not have one) for your aftercare needs so that  they can reassess your need for medications and monitor your lab values.    Allergies as of 08/05/2020   No Known Allergies     Medication List    TAKE these medications   albuterol 108 (90 Base) MCG/ACT inhaler Commonly known as: VENTOLIN HFA Inhale 1-2 puffs into the lungs every 6 (six) hours as needed for wheezing or shortness of breath.   CLARITIN PO Take 10 mg by mouth at bedtime. Allertec   doxazosin 2 MG tablet Commonly known as: CARDURA Take 1 tablet (2 mg total) by mouth at bedtime.   HYDROcodone-acetaminophen 5-325 MG tablet Commonly known as: NORCO/VICODIN Take 1 tablet by mouth every 6 (six) hours as needed for moderate pain.   levothyroxine 150 MCG tablet Commonly known as: SYNTHROID TAKE ONE TABLET BY MOUTH ONE TIME DAILY  BEFORE BREAKFAST What changed:   how much to take  how to take this  when to take this  additional instructions   Melatonin 10 MG Tabs Take 10 mg by mouth at bedtime as needed (sleep).   omeprazole 20 MG capsule Commonly known as: PRILOSEC Take 1 capsule (20 mg total) by mouth 2 (two) times daily  before a meal.   propranolol ER 80 MG 24 hr capsule Commonly known as: INDERAL LA Take 1 capsule (80 mg total) by mouth daily.   simvastatin 40 MG tablet Commonly known as: ZOCOR Take 1 tablet (40 mg total) by mouth at bedtime.        No Known Allergies    The results of significant diagnostics from this hospitalization (including imaging, microbiology, ancillary and laboratory) are listed below for reference.    Significant Diagnostic Studies: US RENAL  Result Date: 08/05/2020 CLINICAL DATA:  Acute kidney injury EXAM: RENAL / URINARY TRACT ULTRASOUND COMPLETE COMPARISON:  None. FINDINGS: Right Kidney: Renal measurements: 11.1 x 5.6 x 3.4 cm = volume: 108 mL. Mild diffuse parenchymal atrophy with increased renal sinus fat. Normal parenchymal echotexture. No hydronephrosis or solid mass. Left Kidney: Renal measurements: 12.1 x  6.5 x 4.5 cm = volume: 183 mL. Mild diffuse parenchymal atrophy with increased renal sinus fat. Normal parenchymal echotexture. No hydronephrosis or solid mass. Bladder: No wall thickening or filling defect. Prevoid volume is 186 mL. No postvoid residual. Urine flow jets are demonstrated on color flow Doppler imaging. Prostate gland measures 3.7 x 2.8 x 3.7 cm for a volume of 20 mL. Other: Incidental note of splenic enlargement with spleen measuring 16.6 x 8.8 x 10.7 cm for a volume of 816 mL. No focal lesions are identified. IMPRESSION: 1. Mild diffuse renal parenchymal atrophy. No hydronephrosis. 2. Incidental note of splenic enlargement. Electronically Signed   By: Lucienne Capers M.D.   On: 08/05/2020 01:05   DG Chest Port 1 View  Result Date: 08/04/2020 CLINICAL DATA:  Shortness of breath EXAM: PORTABLE CHEST 1 VIEW COMPARISON:  July 02, 2019 FINDINGS: There is ill-defined airspace opacity in each lung base. Lungs otherwise are clear. Heart is upper normal in size with pulmonary vascularity normal. No adenopathy. There is aortic atherosclerosis. No bone lesions. IMPRESSION: Ill-defined airspace opacity in the lung bases. This appearance is consistent with patchy bibasilar pneumonia. Lungs elsewhere clear. Note that atypical organism pneumonia may present in this manner. In this regard, advise check of COVID-19 status. Stable cardiac silhouette.  No adenopathy. Aortic Atherosclerosis (ICD10-I70.0). Electronically Signed   By: Lowella Grip III M.D.   On: 08/04/2020 16:30   DG Bone Survey Met  Result Date: 08/05/2020 CLINICAL DATA:  Severe back pain bilateral hip pain. Hematologic malignancy. History of carcinoma and situ of vocal cord. Thyroid cancer. Anemia. EXAM: METASTATIC BONE SURVEY COMPARISON:  Multiple priors FINDINGS: No lytic or sclerotic bone lesion. Negative for fracture. No acute skeletal abnormality. Mild degenerative changes in the cervical, thoracic, and lumbar spine. Both hip  joints appear normal. Atherosclerotic disease.  Bilateral iliac artery stents. Mild cardiac enlargement without heart failure. Atherosclerotic aortic arch. Mild right lower lobe airspace disease likely atelectasis. No pleural effusion. IMPRESSION: No acute skeletal abnormality. No evidence of skeletal malignancy or fracture. Mild degenerative changes in the spine. Atherosclerotic disease. Cardiac enlargement without heart failure. Mild right lower lobe airspace disease likely atelectasis. This was not present on the prior study of 07/14/2015. Electronically Signed   By: Franchot Gallo M.D.   On: 08/05/2020 11:05    Microbiology: Recent Results (from the past 240 hour(s))  Respiratory Panel by RT PCR (Flu A&B, Covid) - Nasopharyngeal Swab     Status: None   Collection Time: 08/04/20  4:04 PM   Specimen: Nasopharyngeal Swab  Result Value Ref Range Status   SARS Coronavirus 2 by RT PCR NEGATIVE NEGATIVE Final  Comment: (NOTE) SARS-CoV-2 target nucleic acids are NOT DETECTED.  The SARS-CoV-2 RNA is generally detectable in upper respiratoy specimens during the acute phase of infection. The lowest concentration of SARS-CoV-2 viral copies this assay can detect is 131 copies/mL. A negative result does not preclude SARS-Cov-2 infection and should not be used as the sole basis for treatment or other patient management decisions. A negative result may occur with  improper specimen collection/handling, submission of specimen other than nasopharyngeal swab, presence of viral mutation(s) within the areas targeted by this assay, and inadequate number of viral copies (<131 copies/mL). A negative result must be combined with clinical observations, patient history, and epidemiological information. The expected result is Negative.  Fact Sheet for Patients:  PinkCheek.be  Fact Sheet for Healthcare Providers:  GravelBags.it  This test is no t yet  approved or cleared by the [place] FDA and  has been authorized for detection and/or diagnosis of SARS-CoV-2 by FDA under an Emergency Use Authorization (EUA). This EUA will remain  in effect (meaning this test can be used) for the duration of the COVID-19 declaration under Section 564(b)(1) of the Act, 21 U.S.C. section 360bbb-3(b)(1), unless the authorization is terminated or revoked sooner.     Influenza A by PCR NEGATIVE NEGATIVE Final   Influenza B by PCR NEGATIVE NEGATIVE Final    Comment: (NOTE) The Xpert Xpress SARS-CoV-2/FLU/RSV assay is intended as an aid in  the diagnosis of influenza from Nasopharyngeal swab specimens and  should not be used as a sole basis for treatment. Nasal washings and  aspirates are unacceptable for Xpert Xpress SARS-CoV-2/FLU/RSV  testing.  Fact Sheet for Patients: PinkCheek.be  Fact Sheet for Healthcare Providers: GravelBags.it  This test is not yet approved or cleared by the [place] FDA and  has been authorized for detection and/or diagnosis of SARS-CoV-2 by  FDA under an Emergency Use Authorization (EUA). This EUA will remain  in effect (meaning this test can be used) for the duration of the  Covid-19 declaration under Section 564(b)(1) of the Act, 21  U.S.C. section 360bbb-3(b)(1), unless the authorization is  terminated or revoked. Performed at [facility], [address] [place], [place] [address]   Blood culture (routine x 2)     Status: None (Preliminary result)   Collection Time: 08/04/20  5:57 PM   Specimen: BLOOD RIGHT ARM  Result Value Ref Range Status   Specimen Description   Final    BLOOD RIGHT ARM Performed at [facility], [address] [place], [place] [address]    Special Requests   Final    BOTTLES DRAWN AEROBIC AND ANAEROBIC Blood Culture adequate volume Performed at [facility], [address] [place], [place] [address]    Culture   Final    NO GROWTH < 12 HOURS Performed at [facility], [place] [address] [place], [place] [address]    Report Status PENDING  Incomplete  Blood culture (routine x 2)     Status: None (Preliminary result)   Collection Time: 08/04/20 11:04 PM   Specimen: BLOOD LEFT FOREARM  Result Value Ref Range Status   Specimen Description   Final    BLOOD LEFT FOREARM Performed at [facility], [place] [address] [place], [place] [address]    Special Requests   Final    BOTTLES DRAWN AEROBIC AND ANAEROBIC Blood Culture adequate volume Performed at [place] [address]  [place], [place] [address]    Culture  Setup Time PENDING  Incomplete   Culture PENDING  Incomplete   Report Status PENDING  Incomplete     Labs: Basic Metabolic Panel: Recent Labs  Lab 08/04/20 1523 08/05/20 0526  NA 140 142  K 4.0 3.7  CL 103 108  CO2 20* 19*  GLUCOSE 124* 108*  BUN 52* 48*  CREATININE 2.48* 2.06*  CALCIUM 7.0* 6.5*   Liver Function Tests: Recent Labs  Lab 08/04/20 1523 08/05/20 0526  AST 11* 9*  ALT 9 10  ALKPHOS 43 43  BILITOT 0.7 1.1  PROT 6.0* 5.4*  ALBUMIN 3.2* 2.9*   No results for input(s): LIPASE, AMYLASE in the last 168 hours. No results for input(s): AMMONIA in the last 168 hours. CBC: Recent Labs  Lab 08/04/20 1523 08/04/20 1928 08/04/20 2310 08/05/20 0012 08/05/20 0526  WBC 44.0*  --  28.0*  --  23.2*  NEUTROABS 2.1  --   --   --   --   HGB 7.3*  --  6.1*  --  5.7*  HCT 24.3*  --  20.6*  --  19.2*  MCV 104.3*  --  106.7*  --  107.3*  PLT 9* 6* 7* 6* 12*   Cardiac Enzymes: No results for input(s): CKTOTAL, CKMB, CKMBINDEX, TROPONINI in the last 168 hours. BNP: BNP (last 3 results) Recent Labs    08/04/20 1523  BNP 46.5    ProBNP (last 3 results) No results for input(s): PROBNP in the last 8760 hours.  CBG: No results for input(s): GLUCAP in the last 168 hours.     Signed:  Annita Brod, MD [facility] 08/05/2020, 1:38 PM

## 2020-08-05 NOTE — Progress Notes (Addendum)
Referring Physician(s): Sherrill,B  Supervising Physician: Jacqulynn Cadet  Patient Status:  [facility] - In-pt  Chief Complaint:  Dyspnea, back pain, cough, weakness  Subjective: Patient familiar to IR service from gastrostomy tube placement in 2016.  He has a past medical history significant for peripheral vascular disease, BPH, thyroid cancer/vocal cord carcinoma in situ, chronic kidney disease, COPD, degenerative disc disease, GERD, hyperlipidemia, hypothyroidism, skin cancer, and sleep apnea.  He was recently admitted to [facility] with acute on chronic low back pain, malaise, cough, dyspnea.  Laboratory evaluation revealed severe anemia, severe thrombocytopenia and leukocytosis.  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  COVID-19 negative.Blood cultures pending.  Patient was seen by oncology and request now received for CT-guided bone marrow biopsy for further evaluation.  He currently denies fever, headache, chest pain, abdominal pain, nausea, vomiting.  Past Medical History:  Diagnosis Date  . Arthritis   . Atherosclerosis   . Atherosclerotic PVD with intermittent claudication (HCC)    stent left leg  . BPH (benign prostatic hyperplasia)   . Bulging lumbar disc   . Cancer ([place]) 11/12/14   vocal cord  carcinoma in situ , radiation; thyroid cancer  . Carotid bruit   . Chronic kidney disease    chronic stage III  . COPD (chronic obstructive pulmonary disease) ([place])   . DDD (degenerative disc disease), lumbar   . Dysphonia   . Dysplasia of true vocal cord   . GERD (gastroesophageal reflux disease)   . H/O carotid atherosclerosis    b/l  . Hyperlipidemia   . Hypothyroidism   . Occasional tremors    left hand managed with propranolol  . Peripheral vascular angioplasty status with implants and grafts   . Precancerous lesion 03/05/2018   premelanoma removed from back.   . Radiation 03/10/15- 04/18/16   Larynx  . Sleep apnea     hasn't used CPAP in years  . Spondylosis of lumbosacral region   . Thyroid disease    Past Surgical History:  Procedure Laterality Date  . APPENDECTOMY    . COLONOSCOPY W/ POLYPECTOMY    . DIODE LASER APPLICATION Left 09/81/1914   Procedure: DIODE LASER APPLICATION;  Surgeon: Hayden Pedro, MD;  Location: [facility];  Service: Ophthalmology;  Laterality: Left;  . MEMBRANE PEEL Left 08/18/2019   Procedure: MEMBRANE PEEL;  Surgeon: Hayden Pedro, MD;  Location: [place];  Service: Ophthalmology;  Laterality: Left;  . MOUTH SURGERY     tooth extraction  . PARS PLANA VITRECTOMY Left 08/18/2019   PARS PLANA VITRECTOMY 27 GAUGE (Left)  . PARS PLANA VITRECTOMY 27 GAUGE Left 08/18/2019   Procedure: PARS PLANA VITRECTOMY 27 GAUGE;  Surgeon: Hayden Pedro, MD;  Location: [place];  Service: Ophthalmology;  Laterality: Left;  . PHOTOCOAGULATION WITH LASER Left 08/18/2019   Procedure: PHOTOCOAGULATION WITH LASER;  Surgeon: Hayden Pedro, MD;  Location: [place];  Service: Ophthalmology;  Laterality: Left;  . THYROID SURGERY    . TONSILLECTOMY    . vocal cord biopsy  11/12/14   squamous cell carcinoma in situ     Allergies: Patient has no known allergies.  Medications: Prior to Admission medications   Medication Sig Start Date End Date Taking? Authorizing Provider  albuterol (VENTOLIN HFA) 108 (90 Base) MCG/ACT inhaler Inhale 1-2 puffs into the lungs every 6 (six) hours as needed for wheezing or shortness of breath.   Yes [provider]  doxazosin (CARDURA) 2 MG tablet Take 1 tablet (2 mg total) by mouth at bedtime. 02/26/20  Yes Cirigliano, Mary K, DO  HYDROcodone-acetaminophen (NORCO/VICODIN) 5-325 MG tablet Take 1 tablet by mouth every 6 (six) hours as needed for moderate pain. 11/03/19  Yes Cirigliano, Mary K, DO  levothyroxine (SYNTHROID) 150 MCG tablet TAKE ONE TABLET BY MOUTH ONE TIME DAILY  BEFORE BREAKFAST Patient taking differently: Take 150 mcg by mouth daily before  breakfast.  02/26/20  Yes Cirigliano, Mary K, DO  Loratadine (CLARITIN PO) Take 10 mg by mouth at bedtime. Allertec   Yes [provider]  Melatonin 10 MG TABS Take 10 mg by mouth at bedtime as needed (sleep).    Yes [provider]  omeprazole (PRILOSEC) 20 MG capsule Take 1 capsule (20 mg total) by mouth 2 (two) times daily before a meal. 02/26/20  Yes Cirigliano, Mary K, DO  propranolol ER (INDERAL LA) 80 MG 24 hr capsule Take 1 capsule (80 mg total) by mouth daily. 02/26/20  Yes Cirigliano, Mary K, DO  simvastatin (ZOCOR) 40 MG tablet Take 1 tablet (40 mg total) by mouth at bedtime. 02/26/20  Yes Cirigliano, Mary K, DO  fluticasone furoate-vilanterol (BREO ELLIPTA) 100-25 MCG/INH AEPB Inhale 1 puff into the lungs daily. Patient not taking: Reported on 08/04/2020 05/24/20   Martyn [place]rich, NP  predniSONE (DELTASONE) 20 MG tablet 3 tabs po daily x 3 days, 2 tabs po daily x 3 days, 1 tab po daily x 3 days, 1/2 tabs po x 3 days Patient not taking: Reported on 08/04/2020 07/14/20   Cirigliano, Garvin Fila, DO  traMADol (ULTRAM) 50 MG tablet TAKE ONE TABLET BY MOUTH EVERY TWELVE HOURS AS NEEDED FOR SEVERE PAIN Patient not taking: Reported on 08/04/2020 11/03/19   Cirigliano, Garvin Fila, DO  umeclidinium-vilanterol (ANORO ELLIPTA) 62.5-25 MCG/INH AEPB Inhale 1 puff into the lungs daily. Patient not taking: Reported on 08/04/2020 04/25/20   Rigoberto Noel, MD     Vital Signs: BP 135/61   Pulse 87   Temp 99 F (37.2 C) (Oral)   Resp 20   Ht 5' 10" (1.778 m)   Wt 215 lb (97.5 kg)   SpO2 94%   BMI 30.85 kg/m   Physical Exam patient awake, alert.  Chest with few rhonchi over posterior chest bilaterally, heart with regular rate/ rhythm.  Abdomen soft, positive bowel sounds, nontender.  No lower extremity edema.  Scattered ecchymosis.  Some bleeding noted from nasal basal cell carcinoma removal site  Imaging: US RENAL  Result Date: 08/05/2020 CLINICAL DATA:  Acute kidney injury EXAM: RENAL /  URINARY TRACT ULTRASOUND COMPLETE COMPARISON:  None. FINDINGS: Right Kidney: Renal measurements: 11.1 x 5.6 x 3.4 cm = volume: 108 mL. Mild diffuse parenchymal atrophy with increased renal sinus fat. Normal parenchymal echotexture. No hydronephrosis or solid mass. Left Kidney: Renal measurements: 12.1 x 6.5 x 4.5 cm = volume: 183 mL. Mild diffuse parenchymal atrophy with increased renal sinus fat. Normal parenchymal echotexture. No hydronephrosis or solid mass. Bladder: No wall thickening or filling defect. Prevoid volume is 186 mL. No postvoid residual. Urine flow jets are demonstrated on color flow Doppler imaging. Prostate gland measures 3.7 x 2.8 x 3.7 cm for a volume of 20 mL. Other: Incidental note of splenic enlargement with spleen measuring 16.6 x 8.8 x 10.7 cm for a volume of 816 mL. No focal lesions are identified. IMPRESSION: 1. Mild diffuse renal parenchymal atrophy. No hydronephrosis. 2. Incidental note of splenic enlargement. Electronically  Signed   By: Lucienne Capers M.D.   On: 08/05/2020 01:05   DG Chest Port 1 View  Result Date: 08/04/2020 CLINICAL DATA:  Shortness of breath EXAM: PORTABLE CHEST 1 VIEW COMPARISON:  July 02, 2019 FINDINGS: There is ill-defined airspace opacity in each lung base. Lungs otherwise are clear. Heart is upper normal in size with pulmonary vascularity normal. No adenopathy. There is aortic atherosclerosis. No bone lesions. IMPRESSION: Ill-defined airspace opacity in the lung bases. This appearance is consistent with patchy bibasilar pneumonia. Lungs elsewhere clear. Note that atypical organism pneumonia may present in this manner. In this regard, advise check of COVID-19 status. Stable cardiac silhouette.  No adenopathy. Aortic Atherosclerosis (ICD10-I70.0). Electronically Signed   By: Lowella Grip III M.D.   On: 08/04/2020 16:30    Labs:  CBC: Recent Labs    09/17/19 1016 09/17/19 1016 08/04/20 1523 08/04/20 1523 08/04/20 1928 08/04/20 2310  08/05/20 0012 08/05/20 0526  WBC 8.7  --  44.0*  --   --  28.0*  --  23.2*  HGB 13.8  --  7.3*  --   --  6.1*  --  5.7*  HCT 41.9  --  24.3*  --   --  20.6*  --  19.2*  PLT 154.0   < > 9*   < > 6* 7* 6* 12*   < > = values in this interval not displayed.    COAGS: Recent Labs    08/04/20 1928 08/05/20 0012  INR 1.4* 1.3*  APTT 51* 44*    BMP: Recent Labs    08/18/19 0915 09/17/19 1016 08/04/20 1523 08/05/20 0526  NA 141 142 140 142  K 4.0 4.0 4.0 3.7  CL 107 105 103 108  CO2 26 30 20* 19*  GLUCOSE 97 112* 124* 108*  BUN 11 12 52* 48*  CALCIUM 8.6* 8.4 7.0* 6.5*  CREATININE 1.13 1.05 2.48* 2.06*  GFRNONAA >60  --  23* 29*  GFRAA >60  --  27* 34*    LIVER FUNCTION TESTS: Recent Labs    08/04/20 1523 08/05/20 0526  BILITOT 0.7 1.1  AST 11* 9*  ALT 9 10  ALKPHOS 43 43  PROT 6.0* 5.4*  ALBUMIN 3.2* 2.9*    Assessment and Plan: 84 yo male with past medical history significant for peripheral vascular disease, BPH, thyroid cancer/vocal cord carcinoma in situ, chronic kidney disease, COPD, degenerative disc disease, GERD, hyperlipidemia, hypothyroidism, skin cancer, and sleep apnea.  He was recently admitted to [facility] with acute on chronic low back pain, malaise, cough, dyspnea.  Laboratory evaluation revealed severe anemia, severe thrombocytopenia and leukocytosis.  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  COVID-19 negative.Blood cultures pending.  Patient was seen by oncology and request now received for CT-guided bone marrow biopsy for further evaluation.Risks and benefits of procedure was discussed with the patient  including, but not limited to bleeding, infection, damage to adjacent structures or low yield requiring additional tests.  All of the questions were answered and there is agreement to proceed.  Consent signed and in chart.  ADDENDUM (1007): RECEIVED CALL FROM ONCOLOGY STATING THAT BONE MARROW  BIOPSY WAS CANCELLED AND PT NOW AWAITING TRANSFER TO [facility]   Electronically Signed: D. Rowe Robert, PA-C 08/05/2020, 9:28 AM   I spent a total of 20 minutes at the the patient's bedside AND on the patient's hospital floor or unit, greater than 50% of which  was counseling/coordinating care for CT-guided bone marrow biopsy    Patient ID: Victor [name], male   DOB: 05/04/1936, 84 y.o.   MRN: [id number]

## 2020-08-05 NOTE — Progress Notes (Addendum)
HEMATOLOGY-ONCOLOGY PROGRESS NOTE  SUBJECTIVE: The patient just returned from bone survey.  He reports increased pain in his left hip.  He is asking for pain medication.  No bleeding reported.  Has some discomfort in his right eye secondary to swelling.  PHYSICAL EXAMINATION:  Vitals:   08/05/20 1048 08/05/20 1113  BP: (!) 138/50 (!) 138/50  Pulse: 81 80  Resp:    Temp: 98.7 F (37.1 C) 98.8 F (37.1 C)  SpO2: 94% 94%   Filed Weights   08/04/20 1519  Weight: 97.5 kg    Intake/Output from previous day: 09/30 0701 - 10/01 0700 In: 1750.6 [Blood:394; IV Piggyback:1356.6] Out: 2 [Urine:2]  HEENT: No thrush, few petechiae at the palate, neck without mass Lungs: Inspiratory rhonchi at the lower posterior chest bilaterally, no respiratory distress Cardiac: Distant heart sounds, regular rate and rhythm Abdomen: No hepatosplenomegaly  Vascular: No leg edema Lymph nodes: No cervical, supraclavicular, axillary, or inguinal nodes Neurologic: Alert and oriented, motor exam appears intact in the upper and lower extremities bilaterally Skin: Few ecchymoses over the trunk and extremities, petechiae at the lower legs bilaterally  LABORATORY DATA:  I have reviewed the data as listed CMP Latest Ref Rng & Units 08/05/2020 08/04/2020 09/17/2019  Glucose 70 - 99 mg/dL 108(H) 124(H) 112(H)  BUN 8 - 23 mg/dL 48(H) 52(H) 12  Creatinine 0.61 - 1.24 mg/dL 2.06(H) 2.48(H) 1.05  Sodium 135 - 145 mmol/L 142 140 142  Potassium 3.5 - 5.1 mmol/L 3.7 4.0 4.0  Chloride 98 - 111 mmol/L 108 103 105  CO2 22 - 32 mmol/L 19(L) 20(L) 30  Calcium 8.9 - 10.3 mg/dL 6.5(L) 7.0(L) 8.4  Total Protein 6.5 - 8.1 g/dL 5.4(L) 6.0(L) -  Total Bilirubin 0.3 - 1.2 mg/dL 1.1 0.7 -  Alkaline Phos 38 - 126 U/L 43 43 -  AST 15 - 41 U/L 9(L) 11(L) -  ALT 0 - 44 U/L 10 9 -    Lab Results  Component Value Date   WBC 23.2 (H) 08/05/2020   HGB 5.7 (LL) 08/05/2020   HCT 19.2 (L) 08/05/2020   MCV 107.3 (H) 08/05/2020   PLT  12 (LL) 08/05/2020   NEUTROABS 2.1 08/04/2020    US RENAL  Result Date: 08/05/2020 CLINICAL DATA:  Acute kidney injury EXAM: RENAL / URINARY TRACT ULTRASOUND COMPLETE COMPARISON:  None. FINDINGS: Right Kidney: Renal measurements: 11.1 x 5.6 x 3.4 cm = volume: 108 mL. Mild diffuse parenchymal atrophy with increased renal sinus fat. Normal parenchymal echotexture. No hydronephrosis or solid mass. Left Kidney: Renal measurements: 12.1 x 6.5 x 4.5 cm = volume: 183 mL. Mild diffuse parenchymal atrophy with increased renal sinus fat. Normal parenchymal echotexture. No hydronephrosis or solid mass. Bladder: No wall thickening or filling defect. Prevoid volume is 186 mL. No postvoid residual. Urine flow jets are demonstrated on color flow Doppler imaging. Prostate gland measures 3.7 x 2.8 x 3.7 cm for a volume of 20 mL. Other: Incidental note of splenic enlargement with spleen measuring 16.6 x 8.8 x 10.7 cm for a volume of 816 mL. No focal lesions are identified. IMPRESSION: 1. Mild diffuse renal parenchymal atrophy. No hydronephrosis. 2. Incidental note of splenic enlargement. Electronically Signed   By: Lucienne Capers M.D.   On: 08/05/2020 01:05   DG Chest Port 1 View  Result Date: 08/04/2020 CLINICAL DATA:  Shortness of breath EXAM: PORTABLE CHEST 1 VIEW COMPARISON:  July 02, 2019 FINDINGS: There is ill-defined airspace opacity in each lung base. Lungs otherwise are  clear. Heart is upper normal in size with pulmonary vascularity normal. No adenopathy. There is aortic atherosclerosis. No bone lesions. IMPRESSION: Ill-defined airspace opacity in the lung bases. This appearance is consistent with patchy bibasilar pneumonia. Lungs elsewhere clear. Note that atypical organism pneumonia may present in this manner. In this regard, advise check of COVID-19 status. Stable cardiac silhouette.  No adenopathy. Aortic Atherosclerosis (ICD10-I70.0). Electronically Signed   By: Lowella Grip III M.D.   On: 08/04/2020  16:30   DG Bone Survey Met  Result Date: 08/05/2020 CLINICAL DATA:  Severe back pain bilateral hip pain. Hematologic malignancy. History of carcinoma and situ of vocal cord. Thyroid cancer. Anemia. EXAM: METASTATIC BONE SURVEY COMPARISON:  Multiple priors FINDINGS: No lytic or sclerotic bone lesion. Negative for fracture. No acute skeletal abnormality. Mild degenerative changes in the cervical, thoracic, and lumbar spine. Both hip joints appear normal. Atherosclerotic disease.  Bilateral iliac artery stents. Mild cardiac enlargement without heart failure. Atherosclerotic aortic arch. Mild right lower lobe airspace disease likely atelectasis. No pleural effusion. IMPRESSION: No acute skeletal abnormality. No evidence of skeletal malignancy or fracture. Mild degenerative changes in the spine. Atherosclerotic disease. Cardiac enlargement without heart failure. Mild right lower lobe airspace disease likely atelectasis. This was not present on the prior study of 07/14/2015. Electronically Signed   By: Franchot Gallo M.D.   On: 08/05/2020 11:05    ASSESSMENT AND PLAN: 1.  Leukocytosis, anemia, thrombocytopenia 2.  Cough/dyspnea with chest x-ray findings concerning for pneumonia 3.  Left lower back/pelvic pain 4.  COPD 5.  Remote history of thyroid cancer 6.  History of larynx cancer 7.  Renal failure 8.  Removal of basal cell carcinoma from the nose 08/04/2020 9.  Coagulopathy  Mr. Mandt appears stable.  A second unit of blood is being hung at the time my visit.  Pathology has reviewed peripheral blood smear which is concerning for an acute leukemia.  Potential diagnosis has been discussed with the patient.  We have reached out to [facility] who has agreed to transfer the patient to their facility.  Details are currently being arranged.  I have discussed with our interventional radiology department who will cancel bone marrow biopsy today.  Bone marrow biopsy will be performed at [facility] at the  discretion of the hematology team.  I have adjusted his hydrocodone to be 1 tablet every 4 hours as needed for pain.  I have also ordered an ice pack to his right eye.  Recommendations: 1.  Transfuse for hemoglobin less than 7 or platelets less than 10,000 or active bleeding. 2.  Hydrocodone 1 tablet every 4 hours as needed for pain. 3.  Ice pack to right eye as needed. 4.  Transfer to [facility] hematology service (Dr. Linus Orn) when bed available.    LOS: 1 day   Mikey Bussing, DNP, AGPCNP-BC, AOCNP 08/05/20 Mr. Mckey was interviewed and examined.  He appears unchanged.  He has persistent severe anemia and thrombocytopenia.  Splenomegaly was noted on a renal ultrasound today.  Dr. Gari Crown reviewed the peripheral blood smear this morning.  He is concerned Mr. Hayashida has a hematopoietic malignancy, potentially AML.  I contacted Dr. Linus Orn and he agrees to accept Mr. Math in transfer.  I will be glad to participate in his care as needed going forward.

## 2020-08-05 NOTE — Progress Notes (Signed)
MD paged for SOB and pt requesting breathing tx. Vitals stable. Hgb 5.7. Waiting for platelets to finish and then will start PRBC. Awaiting new orders.

## 2020-08-05 NOTE — Progress Notes (Signed)
Critical called to RN for hgb and platelet. Provider is at pt bedside currently. MD made aware.

## 2020-08-05 NOTE — Plan of Care (Signed)
  Problem: Nutrition: Goal: Adequate nutrition will be maintained Outcome: Progressing   

## 2020-08-06 DIAGNOSIS — E669 Obesity, unspecified: Secondary | ICD-10-CM

## 2020-08-06 DIAGNOSIS — E883 Tumor lysis syndrome: Principal | ICD-10-CM

## 2020-08-06 DIAGNOSIS — C9500 Acute leukemia of unspecified cell type not having achieved remission: Secondary | ICD-10-CM

## 2020-08-06 DIAGNOSIS — N182 Chronic kidney disease, stage 2 (mild): Secondary | ICD-10-CM

## 2020-08-06 LAB — CBC WITH DIFFERENTIAL/PLATELET
Abs Immature Granulocytes: 1.07 10*3/uL — ABNORMAL HIGH (ref 0.00–0.07)
Basophils Absolute: 0.1 10*3/uL (ref 0.0–0.1)
Basophils Relative: 0 %
Eosinophils Absolute: 0.1 10*3/uL (ref 0.0–0.5)
Eosinophils Relative: 1 %
HCT: 25.3 % — ABNORMAL LOW (ref 39.0–52.0)
Hemoglobin: 7.6 g/dL — ABNORMAL LOW (ref 13.0–17.0)
Immature Granulocytes: 6 %
Lymphocytes Relative: 12 %
Lymphs Abs: 2.4 10*3/uL (ref 0.7–4.0)
MCH: 30.2 pg (ref 26.0–34.0)
MCHC: 30 g/dL (ref 30.0–36.0)
MCV: 100.4 fL — ABNORMAL HIGH (ref 80.0–100.0)
Monocytes Absolute: 14.2 10*3/uL — ABNORMAL HIGH (ref 0.1–1.0)
Monocytes Relative: 73 %
Neutro Abs: 1.6 10*3/uL — ABNORMAL LOW (ref 1.7–7.7)
Neutrophils Relative %: 8 %
Platelets: 14 10*3/uL — CL (ref 150–400)
RBC: 2.52 MIL/uL — ABNORMAL LOW (ref 4.22–5.81)
RDW: 23.6 % — ABNORMAL HIGH (ref 11.5–15.5)
WBC: 19.5 10*3/uL — ABNORMAL HIGH (ref 4.0–10.5)
nRBC: 0.3 % — ABNORMAL HIGH (ref 0.0–0.2)

## 2020-08-06 LAB — COMPREHENSIVE METABOLIC PANEL
ALT: 11 U/L (ref 0–44)
AST: 12 U/L — ABNORMAL LOW (ref 15–41)
Albumin: 3 g/dL — ABNORMAL LOW (ref 3.5–5.0)
Alkaline Phosphatase: 52 U/L (ref 38–126)
Anion gap: 10 (ref 5–15)
BUN: 30 mg/dL — ABNORMAL HIGH (ref 8–23)
CO2: 23 mmol/L (ref 22–32)
Calcium: 6.5 mg/dL — ABNORMAL LOW (ref 8.9–10.3)
Chloride: 109 mmol/L (ref 98–111)
Creatinine, Ser: 1.43 mg/dL — ABNORMAL HIGH (ref 0.61–1.24)
GFR calc Af Amer: 52 mL/min — ABNORMAL LOW (ref >60)
GFR calc non Af Amer: 45 mL/min — ABNORMAL LOW (ref >60)
Glucose, Bld: 116 mg/dL — ABNORMAL HIGH (ref 70–99)
Potassium: 3.8 mmol/L (ref 3.5–5.1)
Sodium: 142 mmol/L (ref 135–145)
Total Bilirubin: 1.2 mg/dL (ref 0.3–1.2)
Total Protein: 5.6 g/dL — ABNORMAL LOW (ref 6.5–8.1)

## 2020-08-06 LAB — TYPE AND SCREEN
ABO/RH(D): A POS
Antibody Screen: NEGATIVE
Unit division: 0
Unit division: 0

## 2020-08-06 LAB — BPAM PLATELET PHERESIS
Blood Product Expiration Date: 202110022359
ISSUE DATE / TIME: 202110010251
Unit Type and Rh: 7300

## 2020-08-06 LAB — PREPARE PLATELET PHERESIS: Unit division: 0

## 2020-08-06 LAB — BPAM RBC
Blood Product Expiration Date: 202110162359
Blood Product Expiration Date: 202110162359
ISSUE DATE / TIME: 202110010620
ISSUE DATE / TIME: 202110011053
Unit Type and Rh: 6200
Unit Type and Rh: 6200

## 2020-08-06 LAB — URIC ACID: Uric Acid, Serum: 10 mg/dL — ABNORMAL HIGH (ref 3.7–8.6)

## 2020-08-06 MED ORDER — SODIUM CHLORIDE 0.9% IV SOLUTION: Freq: Once | INTRAVENOUS | Status: AC

## 2020-08-06 MED ORDER — ALLOPURINOL 300 MG PO TABS
600.0000 mg | ORAL_TABLET | Freq: Every day | ORAL | Status: DC
  Administered 2020-08-06: 600 mg via ORAL
  Filled 2020-08-06: qty 2

## 2020-08-06 MED ORDER — FUROSEMIDE 10 MG/ML IJ SOLN
20.0000 mg | Freq: Once | INTRAMUSCULAR | Status: AC
  Administered 2020-08-06: 20 mg via INTRAVENOUS
  Filled 2020-08-06: qty 2

## 2020-08-06 MED ORDER — ALLOPURINOL 300 MG PO TABS
600.0000 mg | ORAL_TABLET | Freq: Every day | ORAL | 0 refills | Status: DC
Start: 2020-08-06 — End: 2020-08-18

## 2020-08-06 MED ORDER — SODIUM CHLORIDE 0.9 % IV SOLN: INTRAVENOUS | Status: DC

## 2020-08-06 NOTE — Progress Notes (Signed)
HEMATOLOGY-ONCOLOGY PROGRESS NOTE  SUBJECTIVE: Patient was admitted with complaints of back pain and emergency room lab work revealed pancytopenia.  Dr. Malachy Mood performed lab work which showed peripheral blood containing blasts.  Just last week he had a basal cell cancer removal surgery on his nose and he is bruised from that.  He appears to be short of breath even at rest.  But he is awake alert and oriented.  He continues to have some pain in the back.   OBJECTIVE: REVIEW OF SYSTEMS:   Severe fatigue, shortness of breath at rest, back pain  PHYSICAL EXAMINATION:    Vitals:   08/05/20 2140 08/06/20 0529  BP: (!) 148/62 (!) 119/36  Pulse: 73 83  Resp: 18 18  Temp: 98.2 F (36.8 C) 98 F (36.7 C)  SpO2: 97% 97%   Filed Weights   08/04/20 1519  Weight: 215 lb (97.5 kg)    Physical examination: Bruising of the right eye and dressing on the right side of the nose from recent basal cell cancer surgery. Lungs appear to be clear but he is tachypneic.  LABORATORY DATA:  I have reviewed the data as listed CMP Latest Ref Rng & Units 08/05/2020 08/05/2020 08/04/2020  Glucose 70 - 99 mg/dL 113(H) 108(H) 124(H)  BUN 8 - 23 mg/dL 43(H) 48(H) 52(H)  Creatinine 0.61 - 1.24 mg/dL 1.71(H) 2.06(H) 2.48(H)  Sodium 135 - 145 mmol/L 142 142 140  Potassium 3.5 - 5.1 mmol/L 3.8 3.7 4.0  Chloride 98 - 111 mmol/L 109 108 103  CO2 22 - 32 mmol/L 20(L) 19(L) 20(L)  Calcium 8.9 - 10.3 mg/dL 6.6(L) 6.5(L) 7.0(L)  Total Protein 6.5 - 8.1 g/dL - 5.4(L) 6.0(L)  Total Bilirubin 0.3 - 1.2 mg/dL - 1.1 0.7  Alkaline Phos 38 - 126 U/L - 43 43  AST 15 - 41 U/L - 9(L) 11(L)  ALT 0 - 44 U/L - 10 9    Lab Results  Component Value Date   WBC 23.8 (H) 08/05/2020   HGB 7.9 (L) 08/05/2020   HCT 25.4 (L) 08/05/2020   MCV 100.4 (H) 08/05/2020   PLT 17 (LL) 08/05/2020   NEUTROABS 2.1 08/04/2020    ASSESSMENT AND PLAN: 1.  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  Initial leukocytosis of 44  has come down to 23.8 yesterday.  Bone marrow biopsy was not performed because he was supposed to have been transferred to [facility] yesterday.  Unfortunately beds were not available and hence he has remained here.  2. will obtain CBC with differential CMP and uric acid levels today. 3.  I suspect that he will need blood/platelet transfusions.  He has severe thrombocytopenia.  08/05/2020: Platelet count was 17.  Hemoglobin was 7.9 after transfusion the day before. 4.  Multiple myeloma lab work is pending 5.  DIC panel revealed prolongation of PT PTT but fibrinogen was 684.  LDH 320 Given his advanced age, if this is confirmed AML, he has extremely poor prognosis. We will be available for any questions or concerns.  Hopefully he will be able to find a bed soon to be transferred to [facility].

## 2020-08-06 NOTE — Discharge Summary (Signed)
Discharge Summary  Victor [name] OEU:[id number] DOB: May 08, 1936  PCP: Ronnald Nian, DO  Admit date: 08/04/2020 Discharge date: 08/06/2020  Time spent: 35 minutes  Recommendations for Follow-up:  1. Patient being transferred to [facility] to the oncology service 2. Patient: Allopurinol 600 mg p.o. daily  Discharge Diagnoses:  Active Hospital Problems   Diagnosis Date Noted  . Acute leukemia ([place]) 08/05/2020  . 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 Thrombocytopenia ([place]) 08/05/2020  . Anemia 08/05/2020  . AKI (acute kidney injury) ([place]) 08/05/2020  . Hematologic malignancy ([place]) 08/05/2020  . Hypothyroidism 08/05/2020  . Obesity (BMI 30-39.9) 08/04/2020  . COPD, moderate ([place]) 05/24/2020  . 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 History of thyroid cancer 03/26/2014  . Chronic kidney disease, stage 2 (mild) 03/17/2014  . Hyperlipidemia 03/17/2014    Resolved Hospital Problems  No resolved problems to display.    Discharge Condition: Critically ill  Diet recommendation: N.p.o. until after biopsy  Vitals:   08/06/20 0529 08/06/20 1300  BP: (!) 119/36 (!) 146/49  Pulse: 83 88  Resp: 18 18  Temp: 98 F (36.7 C) 98.6 F (37 C)  SpO2: 97% 95%    History of present illness:  Patient is an 84 year old male with past medical history significant for vocal cord carcinoma status post radiation, thyroid cancer status post surgeries and multiple skin cancers status post removal including a basal cell carcinoma of his nose that was actually removed earlier in the day of 9/30.  Patient had been having severe left lower back pain for the past few days and when he saw his sports medicine physician on 9/30 in follow-up, he looked to be quite pale and was sent over to the emergency room for further evaluation.  Lab work in the emergency room revealed a creatinine of 2.48 (1.0 baseline), hemoglobin of 7.3, platelet count of 9 and a white count of 44 with all of these  labs being normal last year.  Chest x-ray noted questionable bibasilar pneumonia findings.  On differential, patient was noted to have 33.8 thousand monocytes with atypical monocytes noted on smear and no schistocytes on DIC panel.  Patient was given IV fluids, antibiotics for possible pneumonia and transferred from [place] to [facility] to the hospitalist service for further evaluation.  Oncology was consulted and review felt that he likely had a hematologic malignancy process such as leukemia.  Following admission to the hospital, patient was transfused 2 unit packed red blood cells.  Prior to transfusion, patient had repeat blood work following IV fluids and creatinine had improved to 2.06 and his hemoglobin had dropped to 5.7.  White blood cell count down to 23.2.  Uric acid elevated at 15.5 likely from high cell turnover which would account for his renal function.  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  Case was discussed with oncology at [facility] who given patient's acute severity of symptoms felt best to transfer there.  Initial plans for bone marrow biopsy put on hold as biopsy will be done at [facility] Course:  Principal Problem:   Acute leukemia ([place]) in patient with history of glottic cancer, thyroid cancer and multiple skin cancers causing tumor lysis syndrome: His tumor lysis syndrome is from acute leukemia cell turnover is likely the cause of his acute kidney injury.  Has already improved with some IV fluids.  Have started allopurinol 600 mg  p.o. daily.  He is currently getting gentle IV fluids. Active Problems:   Chronic kidney disease, stage 2 (mild): In part due to tumor lysis.  Continue fluids.  Following blood and hydration, his creatinine which was previously at 1.0 last year has come down from 2.48 on admission to 1.43 x 10/2.  Allopurinol should help this as well.    COPD, moderate ([place]):  Stable during his hospitalization.  Abnormal chest x-ray: Patient received a dose of Rocephin and Zithromax when noted to have elevated white blood cell count, however procalcitonin level less than 0.1, so antibiotics stopped.  No signs of infection otherwise.    Thrombocytopenia ([place]): Due to decreased production in the setting of malignancy.  Status post transfusion of platelets on 10/1 and 10/2.  Platelet count prior to transfusion on 10/2 at 14.    Anemia: Likely in the setting of decreased RBC production in the bone marrow while making increased immature white blood cells from his malignancy.  Status post transfusion 2 unit packed red blood cells.  Hemoglobin on 10/2 at 7.6.   Hyperlipidemia: Statin initially held while patient's work-up ongoing.  Hypothyroidism: Continue Synthroid.    AKI (acute kidney injury) ([place]): As above.  Continue fluids.  BC: Patient meets criteria BMI greater than 30.  Procedures:  Status post transfusion 2 unit packed red blood cells done 10/1  Platelet transfusion on 10/1 and 10/2  Consultations:  Oncology  Discharge Exam: BP (!) 146/49 (BP Location: Left Arm)   Pulse 88   Temp 98.6 F (37 C) (Oral)   Resp 18   Ht 5' 10"  (1.778 m)   Wt 97.5 kg   SpO2 95%   BMI 30.85 kg/m   General: Fatigued, alert and oriented x3, mild distress secondary to shortness of breath and pain Cardiovascular: Regular rate and rhythm, S1-S2 Respiratory: Decreased breath sounds throughout  Discharge Instructions You were cared for by a hospitalist during your hospital stay. If you have any questions about your discharge medications or the care you received while you were in the hospital after you are discharged, you can call the unit and asked to speak with the hospitalist on call if the hospitalist that took care of you is not available. Once you are discharged, your primary care physician will handle any further medical issues. Please note that NO REFILLS for any  discharge medications will be authorized once you are discharged, as it is imperative that you return to your primary care physician (or establish a relationship with a primary care physician if you do not have one) for your aftercare needs so that they can reassess your need for medications and monitor your lab values.    Allergies as of 08/06/2020   No Known Allergies     Medication List    TAKE these medications   albuterol 108 (90 Base) MCG/ACT inhaler Commonly known as: VENTOLIN HFA Inhale 1-2 puffs into the lungs every 6 (six) hours as needed for wheezing or shortness of breath.   allopurinol 300 MG tablet Commonly known as: ZYLOPRIM Take 2 tablets (600 mg total) by mouth daily.   CLARITIN PO Take 10 mg by mouth at bedtime. Allertec   doxazosin 2 MG tablet Commonly known as: CARDURA Take 1 tablet (2 mg total) by mouth at bedtime.   HYDROcodone-acetaminophen 5-325 MG tablet Commonly known as: NORCO/VICODIN Take 1 tablet by mouth every 6 (six) hours as needed for moderate pain.   levothyroxine 150 MCG tablet Commonly known as: SYNTHROID TAKE  ONE TABLET BY MOUTH ONE TIME DAILY  BEFORE BREAKFAST What changed:   how much to take  how to take this  when to take this  additional instructions   Melatonin 10 MG Tabs Take 10 mg by mouth at bedtime as needed (sleep).   omeprazole 20 MG capsule Commonly known as: PRILOSEC Take 1 capsule (20 mg total) by mouth 2 (two) times daily before a meal.   propranolol ER 80 MG 24 hr capsule Commonly known as: INDERAL LA Take 1 capsule (80 mg total) by mouth daily.   simvastatin 40 MG tablet Commonly known as: ZOCOR Take 1 tablet (40 mg total) by mouth at bedtime.        No Known Allergies    The results of significant diagnostics from this hospitalization (including imaging, microbiology, ancillary and laboratory) are listed below for reference.    Significant Diagnostic Studies: US RENAL  Result Date:  08/31/20 CLINICAL DATA:  Acute kidney injury EXAM: RENAL / URINARY TRACT ULTRASOUND COMPLETE COMPARISON:  None. FINDINGS: Right Kidney: Renal measurements: 11.1 x 5.6 x 3.4 cm = volume: 108 mL. Mild diffuse parenchymal atrophy with increased renal sinus fat. Normal parenchymal echotexture. No hydronephrosis or solid mass. Left Kidney: Renal measurements: 12.1 x 6.5 x 4.5 cm = volume: 183 mL. Mild diffuse parenchymal atrophy with increased renal sinus fat. Normal parenchymal echotexture. No hydronephrosis or solid mass. Bladder: No wall thickening or filling defect. Prevoid volume is 186 mL. No postvoid residual. Urine flow jets are demonstrated on color flow Doppler imaging. Prostate gland measures 3.7 x 2.8 x 3.7 cm for a volume of 20 mL. Other: Incidental note of splenic enlargement with spleen measuring 16.6 x 8.8 x 10.7 cm for a volume of 816 mL. No focal lesions are identified. IMPRESSION: 1. Mild diffuse renal parenchymal atrophy. No hydronephrosis. 2. Incidental note of splenic enlargement. Electronically Signed   By: Lucienne Capers M.D.   On: 08-31-2020 01:05   DG Chest Port 1 View  Result Date: 08/04/2020 CLINICAL DATA:  Shortness of breath EXAM: PORTABLE CHEST 1 VIEW COMPARISON:  July 02, 2019 FINDINGS: There is ill-defined airspace opacity in each lung base. Lungs otherwise are clear. Heart is upper normal in size with pulmonary vascularity normal. No adenopathy. There is aortic atherosclerosis. No bone lesions. IMPRESSION: Ill-defined airspace opacity in the lung bases. This appearance is consistent with patchy bibasilar pneumonia. Lungs elsewhere clear. Note that atypical organism pneumonia may present in this manner. In this regard, advise check of COVID-19 status. Stable cardiac silhouette.  No adenopathy. Aortic Atherosclerosis (ICD10-I70.0). Electronically Signed   By: Lowella Grip III M.D.   On: 08/04/2020 16:30   DG Bone Survey Met  Result Date: 08-31-20 CLINICAL DATA:  Severe  back pain bilateral hip pain. Hematologic malignancy. History of carcinoma and situ of vocal cord. Thyroid cancer. Anemia. EXAM: METASTATIC BONE SURVEY COMPARISON:  Multiple priors FINDINGS: No lytic or sclerotic bone lesion. Negative for fracture. No acute skeletal abnormality. Mild degenerative changes in the cervical, thoracic, and lumbar spine. Both hip joints appear normal. Atherosclerotic disease.  Bilateral iliac artery stents. Mild cardiac enlargement without heart failure. Atherosclerotic aortic arch. Mild right lower lobe airspace disease likely atelectasis. No pleural effusion. IMPRESSION: No acute skeletal abnormality. No evidence of skeletal malignancy or fracture. Mild degenerative changes in the spine. Atherosclerotic disease. Cardiac enlargement without heart failure. Mild right lower lobe airspace disease likely atelectasis. This was not present on the prior study of 07/14/2015. Electronically Signed   By:  Franchot Gallo M.D.   On: 08/05/2020 11:05    Microbiology: Recent Results (from the past 240 hour(s))  Respiratory Panel by RT PCR (Flu A&B, Covid) - Nasopharyngeal Swab     Status: None   Collection Time: 08/04/20  4:04 PM   Specimen: Nasopharyngeal Swab  Result Value Ref Range Status   SARS Coronavirus 2 by RT PCR NEGATIVE NEGATIVE Final    Comment: (NOTE) SARS-CoV-2 target nucleic acids are NOT DETECTED.  The SARS-CoV-2 RNA is generally detectable in upper respiratoy specimens during the acute phase of infection. The lowest concentration of SARS-CoV-2 viral copies this assay can detect is 131 copies/mL. A negative result does not preclude SARS-Cov-2 infection and should not be used as the sole basis for treatment or other patient management decisions. A negative result may occur with  improper specimen collection/handling, submission of specimen other than nasopharyngeal swab, presence of viral mutation(s) within the areas targeted by this assay, and inadequate number of  viral copies (<131 copies/mL). A negative result must be combined with clinical observations, patient history, and epidemiological information. The expected result is Negative.  Fact Sheet for Patients:  PinkCheek.be  Fact Sheet for Healthcare Providers:  GravelBags.it  This test is no t yet approved or cleared by the [place] FDA and  has been authorized for detection and/or diagnosis of SARS-CoV-2 by FDA under an Emergency Use Authorization (EUA). This EUA will remain  in effect (meaning this test can be used) for the duration of the COVID-19 declaration under Section 564(b)(1) of the Act, 21 U.S.C. section 360bbb-3(b)(1), unless the authorization is terminated or revoked sooner.     Influenza A by PCR NEGATIVE NEGATIVE Final   Influenza B by PCR NEGATIVE NEGATIVE Final    Comment: (NOTE) The Xpert Xpress SARS-CoV-2/FLU/RSV assay is intended as an aid in  the diagnosis of influenza from Nasopharyngeal swab specimens and  should not be used as a sole basis for treatment. Nasal washings and  aspirates are unacceptable for Xpert Xpress SARS-CoV-2/FLU/RSV  testing.  Fact Sheet for Patients: PinkCheek.be  Fact Sheet for Healthcare Providers: GravelBags.it  This test is not yet approved or cleared by the [place] FDA and  has been authorized for detection and/or diagnosis of SARS-CoV-2 by  FDA under an Emergency Use Authorization (EUA). This EUA will remain  in effect (meaning this test can be used) for the duration of the  Covid-19 declaration under Section 564(b)(1) of the Act, 21  U.S.C. section 360bbb-3(b)(1), unless the authorization is  terminated or revoked. Performed at [facility], [address] [place], [place] [address]   Blood culture (routine x 2)     Status: None (Preliminary result)   Collection Time: 08/04/20  5:57 PM    Specimen: BLOOD RIGHT ARM  Result Value Ref Range Status   Specimen Description   Final    BLOOD RIGHT ARM Performed at [facility], [address] [place], [place] [address]    Special Requests   Final    BOTTLES DRAWN AEROBIC AND ANAEROBIC Blood Culture adequate volume Performed at [facility], [address] [place], [place] [address]    Culture   Final    NO GROWTH 2 DAYS Performed at [facility], [place] [address] [place], [place] [address]    Report Status PENDING  Incomplete  Blood culture (routine x 2)     Status: None (Preliminary result)   Collection Time: 08/04/20  11:04 PM   Specimen: BLOOD LEFT FOREARM  Result Value Ref Range Status   Specimen Description   Final    BLOOD LEFT FOREARM Performed at [facility], [place] [address], [place], [place] [address]    Special Requests   Final    BOTTLES DRAWN AEROBIC AND ANAEROBIC Blood Culture adequate volume Performed at [facility] [address], [place], [place] [address]    Culture  Setup Time PENDING  Incomplete   Culture   Final    NO GROWTH 1 DAY Performed at [facility], [place] [address], [place], [place] [address]    Report Status PENDING  Incomplete     Labs: Basic Metabolic Panel: Recent Labs  Lab 08/04/20 1523 08/05/20 0526 08/05/20 1501 08/06/20 0916  NA 140 142 142 142  K 4.0 3.7 3.8 3.8  CL 103 108 109 109  CO2 20* 19* 20* 23  GLUCOSE 124* 108* 113* 116*  BUN 52* 48* 43* 30*  CREATININE 2.48* 2.06* 1.71* 1.43*  CALCIUM 7.0* 6.5* 6.6* 6.5*   Liver Function Tests: Recent Labs  Lab 08/04/20 1523 08/05/20 0526 08/06/20 0916  AST 11* 9* 12*  ALT 9 10 11   ALKPHOS 43 43 52  BILITOT 0.7 1.1 1.2  PROT 6.0* 5.4* 5.6*  ALBUMIN 3.2* 2.9* 3.0*   No results for input(s): LIPASE, AMYLASE in the last 168 hours. No results for input(s): AMMONIA in the last 168 hours. CBC: Recent Labs  Lab 08/04/20 1523 08/04/20 1928 08/04/20 2310  08/05/20 0012 08/05/20 0526 08/05/20 1501 08/06/20 0916  WBC 44.0*  --  28.0*  --  23.2* 23.8* 19.5*  NEUTROABS 2.1  --   --   --   --   --  1.6*  HGB 7.3*  --  6.1*  --  5.7* 7.9* 7.6*  HCT 24.3*  --  20.6*  --  19.2* 25.4* 25.3*  MCV 104.3*  --  106.7*  --  107.3* 100.4* 100.4*  PLT 9*   < > 7* 6* 12* 17* 14*   < > = values in this interval not displayed.   Cardiac Enzymes: No results for input(s): CKTOTAL, CKMB, CKMBINDEX, TROPONINI in the last 168 hours. BNP: BNP (last 3 results) Recent Labs    08/04/20 1523  BNP 46.5    ProBNP (last 3 results) No results for input(s): PROBNP in the last 8760 hours.  CBG: No results for input(s): GLUCAP in the last 168 hours.     Signed:  Annita Brod, MD [facility] 08/06/2020, 2:26 PM

## 2020-08-06 NOTE — Progress Notes (Signed)
CRITICAL VALUE ALERT  Critical Value:  platelets 14  Date & Time Notied:  08/06/2020  Provider Notified: Dr Maryland Pink  Orders Received/Actions taken: MD aware

## 2020-08-06 NOTE — Progress Notes (Signed)
Patient left the unit at approximately 2035 for [place] unit. Patient was in no obvious distress at time of transfer.

## 2020-08-07 LAB — BPAM PLATELET PHERESIS
Blood Product Expiration Date: 202110052359
ISSUE DATE / TIME: 202110021537
Unit Type and Rh: 6200

## 2020-08-07 LAB — PREPARE PLATELET PHERESIS: Unit division: 0

## 2020-08-08 LAB — KAPPA/LAMBDA LIGHT CHAINS
Kappa free light chain: 41.2 mg/L — ABNORMAL HIGH (ref 3.3–19.4)
Kappa, lambda light chain ratio: 0.93 (ref 0.26–1.65)
Lambda free light chains: 44.1 mg/L — ABNORMAL HIGH (ref 5.7–26.3)

## 2020-08-09 ENCOUNTER — Ambulatory Visit: Payer: Medicare Other | Admitting: Family Medicine

## 2020-08-09 LAB — CULTURE, BLOOD (ROUTINE X 2)
Culture: NO GROWTH
Special Requests: ADEQUATE

## 2020-08-10 LAB — CULTURE, BLOOD (ROUTINE X 2)
Culture: NO GROWTH
Special Requests: ADEQUATE

## 2020-08-11 ENCOUNTER — Other Ambulatory Visit: Payer: Medicare Other

## 2020-08-12 LAB — MULTIPLE MYELOMA PANEL, SERUM
Albumin SerPl Elph-Mcnc: 2.8 g/dL — ABNORMAL LOW (ref 2.9–4.4)
Albumin/Glob SerPl: 1.3 (ref 0.7–1.7)
Alpha 1: 0.4 g/dL (ref 0.0–0.4)
Alpha2 Glob SerPl Elph-Mcnc: 0.8 g/dL (ref 0.4–1.0)
B-Globulin SerPl Elph-Mcnc: 0.6 g/dL — ABNORMAL LOW (ref 0.7–1.3)
Gamma Glob SerPl Elph-Mcnc: 0.4 g/dL (ref 0.4–1.8)
Globulin, Total: 2.2 g/dL (ref 2.2–3.9)
IgA: 182 mg/dL (ref 61–437)
IgG (Immunoglobin G), Serum: 390 mg/dL — ABNORMAL LOW (ref 603–1613)
IgM (Immunoglobulin M), Srm: 43 mg/dL (ref 15–143)
Total Protein ELP: 5 g/dL — ABNORMAL LOW (ref 6.0–8.5)

## 2020-08-17 ENCOUNTER — Other Ambulatory Visit: Payer: Self-pay | Admitting: *Deleted

## 2020-08-17 DIAGNOSIS — C969 Malignant neoplasm of lymphoid, hematopoietic and related tissue, unspecified: Secondary | ICD-10-CM

## 2020-08-17 NOTE — Progress Notes (Signed)
Labs placed for visit tomorrow per [facility] request.

## 2020-08-18 ENCOUNTER — Other Ambulatory Visit: Payer: Self-pay | Admitting: *Deleted

## 2020-08-18 ENCOUNTER — Inpatient Hospital Stay: Payer: Medicare Other | Attending: Oncology

## 2020-08-18 ENCOUNTER — Inpatient Hospital Stay (HOSPITAL_BASED_OUTPATIENT_CLINIC_OR_DEPARTMENT_OTHER): Payer: Medicare Other | Admitting: Oncology

## 2020-08-18 ENCOUNTER — Other Ambulatory Visit: Payer: Self-pay

## 2020-08-18 ENCOUNTER — Inpatient Hospital Stay: Payer: Medicare Other

## 2020-08-18 VITALS — BP 126/64 | HR 88 | Temp 98.1°F | Resp 17 | Ht 70.0 in | Wt 196.5 lb

## 2020-08-18 DIAGNOSIS — Z800 Family history of malignant neoplasm of digestive organs: Secondary | ICD-10-CM | POA: Insufficient documentation

## 2020-08-18 DIAGNOSIS — C9500 Acute leukemia of unspecified cell type not having achieved remission: Secondary | ICD-10-CM

## 2020-08-18 DIAGNOSIS — J90 Pleural effusion, not elsewhere classified: Secondary | ICD-10-CM | POA: Diagnosis not present

## 2020-08-18 DIAGNOSIS — T451X5A Adverse effect of antineoplastic and immunosuppressive drugs, initial encounter: Secondary | ICD-10-CM | POA: Insufficient documentation

## 2020-08-18 DIAGNOSIS — C9200 Acute myeloblastic leukemia, not having achieved remission: Secondary | ICD-10-CM | POA: Insufficient documentation

## 2020-08-18 DIAGNOSIS — E8770 Fluid overload, unspecified: Secondary | ICD-10-CM | POA: Insufficient documentation

## 2020-08-18 DIAGNOSIS — I951 Orthostatic hypotension: Secondary | ICD-10-CM | POA: Insufficient documentation

## 2020-08-18 DIAGNOSIS — M47812 Spondylosis without myelopathy or radiculopathy, cervical region: Secondary | ICD-10-CM | POA: Insufficient documentation

## 2020-08-18 DIAGNOSIS — W19XXXA Unspecified fall, initial encounter: Secondary | ICD-10-CM | POA: Diagnosis not present

## 2020-08-18 DIAGNOSIS — E890 Postprocedural hypothyroidism: Secondary | ICD-10-CM | POA: Insufficient documentation

## 2020-08-18 DIAGNOSIS — C73 Malignant neoplasm of thyroid gland: Secondary | ICD-10-CM | POA: Insufficient documentation

## 2020-08-18 DIAGNOSIS — Z9049 Acquired absence of other specified parts of digestive tract: Secondary | ICD-10-CM | POA: Insufficient documentation

## 2020-08-18 DIAGNOSIS — M25551 Pain in right hip: Secondary | ICD-10-CM | POA: Insufficient documentation

## 2020-08-18 DIAGNOSIS — R079 Chest pain, unspecified: Secondary | ICD-10-CM | POA: Diagnosis not present

## 2020-08-18 DIAGNOSIS — R161 Splenomegaly, not elsewhere classified: Secondary | ICD-10-CM | POA: Diagnosis not present

## 2020-08-18 DIAGNOSIS — C969 Malignant neoplasm of lymphoid, hematopoietic and related tissue, unspecified: Secondary | ICD-10-CM

## 2020-08-18 DIAGNOSIS — D696 Thrombocytopenia, unspecified: Secondary | ICD-10-CM

## 2020-08-18 DIAGNOSIS — M25552 Pain in left hip: Secondary | ICD-10-CM | POA: Insufficient documentation

## 2020-08-18 DIAGNOSIS — Z8521 Personal history of malignant neoplasm of larynx: Secondary | ICD-10-CM | POA: Insufficient documentation

## 2020-08-18 DIAGNOSIS — N400 Enlarged prostate without lower urinary tract symptoms: Secondary | ICD-10-CM | POA: Diagnosis not present

## 2020-08-18 DIAGNOSIS — K5900 Constipation, unspecified: Secondary | ICD-10-CM | POA: Insufficient documentation

## 2020-08-18 DIAGNOSIS — M47816 Spondylosis without myelopathy or radiculopathy, lumbar region: Secondary | ICD-10-CM | POA: Insufficient documentation

## 2020-08-18 DIAGNOSIS — S2231XA Fracture of one rib, right side, initial encounter for closed fracture: Secondary | ICD-10-CM | POA: Insufficient documentation

## 2020-08-18 DIAGNOSIS — M549 Dorsalgia, unspecified: Secondary | ICD-10-CM | POA: Insufficient documentation

## 2020-08-18 DIAGNOSIS — N179 Acute kidney failure, unspecified: Secondary | ICD-10-CM | POA: Insufficient documentation

## 2020-08-18 DIAGNOSIS — J9690 Respiratory failure, unspecified, unspecified whether with hypoxia or hypercapnia: Secondary | ICD-10-CM | POA: Insufficient documentation

## 2020-08-18 DIAGNOSIS — J449 Chronic obstructive pulmonary disease, unspecified: Secondary | ICD-10-CM | POA: Diagnosis not present

## 2020-08-18 DIAGNOSIS — D61810 Antineoplastic chemotherapy induced pancytopenia: Secondary | ICD-10-CM | POA: Diagnosis not present

## 2020-08-18 DIAGNOSIS — I700 Atherosclerosis of aorta: Secondary | ICD-10-CM | POA: Diagnosis not present

## 2020-08-18 DIAGNOSIS — E785 Hyperlipidemia, unspecified: Secondary | ICD-10-CM | POA: Insufficient documentation

## 2020-08-18 DIAGNOSIS — M47814 Spondylosis without myelopathy or radiculopathy, thoracic region: Secondary | ICD-10-CM | POA: Insufficient documentation

## 2020-08-18 DIAGNOSIS — Z7289 Other problems related to lifestyle: Secondary | ICD-10-CM | POA: Insufficient documentation

## 2020-08-18 DIAGNOSIS — D61818 Other pancytopenia: Secondary | ICD-10-CM | POA: Diagnosis not present

## 2020-08-18 DIAGNOSIS — I517 Cardiomegaly: Secondary | ICD-10-CM | POA: Insufficient documentation

## 2020-08-18 DIAGNOSIS — Z87891 Personal history of nicotine dependence: Secondary | ICD-10-CM | POA: Insufficient documentation

## 2020-08-18 DIAGNOSIS — K219 Gastro-esophageal reflux disease without esophagitis: Secondary | ICD-10-CM | POA: Insufficient documentation

## 2020-08-18 DIAGNOSIS — J9811 Atelectasis: Secondary | ICD-10-CM | POA: Insufficient documentation

## 2020-08-18 DIAGNOSIS — Z809 Family history of malignant neoplasm, unspecified: Secondary | ICD-10-CM | POA: Insufficient documentation

## 2020-08-18 DIAGNOSIS — Z79899 Other long term (current) drug therapy: Secondary | ICD-10-CM | POA: Insufficient documentation

## 2020-08-18 LAB — CBC WITH DIFFERENTIAL (CANCER CENTER ONLY)
Abs Immature Granulocytes: 0.02 10*3/uL (ref 0.00–0.07)
Basophils Absolute: 0 10*3/uL (ref 0.0–0.1)
Basophils Relative: 0 %
Eosinophils Absolute: 0 10*3/uL (ref 0.0–0.5)
Eosinophils Relative: 0 %
HCT: 26.9 % — ABNORMAL LOW (ref 39.0–52.0)
Hemoglobin: 8.7 g/dL — ABNORMAL LOW (ref 13.0–17.0)
Immature Granulocytes: 1 %
Lymphocytes Relative: 35 %
Lymphs Abs: 0.9 10*3/uL (ref 0.7–4.0)
MCH: 30.3 pg (ref 26.0–34.0)
MCHC: 32.3 g/dL (ref 30.0–36.0)
MCV: 93.7 fL (ref 80.0–100.0)
Monocytes Absolute: 0.2 10*3/uL (ref 0.1–1.0)
Monocytes Relative: 6 %
Neutro Abs: 1.6 10*3/uL — ABNORMAL LOW (ref 1.7–7.7)
Neutrophils Relative %: 58 %
Platelet Count: 14 10*3/uL — ABNORMAL LOW (ref 150–400)
RBC: 2.87 MIL/uL — ABNORMAL LOW (ref 4.22–5.81)
RDW: 17.2 % — ABNORMAL HIGH (ref 11.5–15.5)
WBC Count: 2.7 10*3/uL — ABNORMAL LOW (ref 4.0–10.5)
nRBC: 0 % (ref 0.0–0.2)

## 2020-08-18 LAB — SAMPLE TO BLOOD BANK

## 2020-08-18 MED ORDER — HEPARIN SOD (PORK) LOCK FLUSH 100 UNIT/ML IV SOLN
500.0000 [IU] | Freq: Once | INTRAVENOUS | Status: AC
  Administered 2020-08-18: 500 [IU] via INTRAVENOUS
  Filled 2020-08-18: qty 5

## 2020-08-18 MED ORDER — SODIUM CHLORIDE 0.9% FLUSH
10.0000 mL | Freq: Once | INTRAVENOUS | Status: AC
  Administered 2020-08-18: 10 mL
  Filled 2020-08-18: qty 10

## 2020-08-18 MED ORDER — HEPARIN SOD (PORK) LOCK FLUSH 100 UNIT/ML IV SOLN
500.0000 [IU] | Freq: Once | INTRAVENOUS | Status: AC
  Filled 2020-08-18: qty 5

## 2020-08-18 MED ORDER — SODIUM CHLORIDE 0.9% FLUSH
10.0000 mL | INTRAVENOUS | Status: DC | PRN
  Filled 2020-08-18: qty 10

## 2020-08-18 NOTE — Progress Notes (Addendum)
Will need to transfuse platelets tomorrow per parameters from [facility]. Blood bank notified. Faxed CBC/diff results to [facility]

## 2020-08-18 NOTE — Progress Notes (Signed)
  [place] OFFICE PROGRESS NOTE   Diagnosis: AML  INTERVAL HISTORY:   I saw Mr. Burnham on August 05, 2020 when he was admitted with severe anemia and leukocytosis.  He was transferred to [facility] on August 06, 2020 with suspicion of leukemia.  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  He began treatment with azacytidine and venetoclax on August 09, 2020.  He was discharged in the hospital on August 15, 2020.  He continues venetoclax.  He reports feeling much better.  No fever.  A right PICC is in place.  The platelet count returned at 16,000 on August 15, 2020.  He received a platelet transfusion. He reports abnormal hearing in the right ear.  Objective:  Vital signs in last 24 hours:  Blood pressure 126/64, pulse 88, temperature 98.1 F (36.7 C), temperature source Tympanic, resp. rate 17, height _0  (1.778 m), weight 196 lb 8 oz (89.1 kg), SpO2 98 %.    HEENT: No thrush or bleeding, cerumen in the right external canal, the tympanic membrane appears clear Resp: Decreased breath sounds at the lower posterior chest bilaterally, no respiratory distress Cardio: Regular rate and rhythm GI: No hepatosplenomegaly, nontender Vascular: No leg edema Skin: Scattered ecchymoses over the extremities  Portacath/PICC-without erythema  Lab Results:  Lab Results  Component Value Date   WBC 2.7 (L) 08/18/2020   HGB 8.7 (L) 08/18/2020   HCT 26.9 (L) 08/18/2020   MCV 93.7 08/18/2020   PLT 14 (L) 08/18/2020   NEUTROABS 1.6 (L) 08/18/2020     Medications: I have reviewed the patient's current medications.   Assessment/Plan:  1.  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, 2020 with severe anemia, thrombocytopenia, and leukocytosis  Transferred to [facility] August 06, 2020, treated with hydroxyurea and allopurinol  Bone marrow biopsy August 08, 2020-AML with monocytic differentiation, 28% blast and 28% atypical monocytes, FLT3-ITD mutation  positive  Cycle 1 azacytidine and venetoclax August 09, 2020  2.  Pancytopenia secondary to #1 and chemotherapy 3.  Right PICC placed August 08, 2020 4.  TTE August 08, 2020-EF 60-65% 5.  Fever during hospital admission October 2021, suspicion of pneumonia, treated with cefepime and discharged on Levaquin 6.  Hypervolemia with respiratory failure during hospital admission October 2021-diuresis, home Lasix 7.  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  Acute renal failure October 2021-improved   Disposition: Victor [name] has been diagnosed with acute myelogenous leukemia with monocytic differentiation.  He is now at day 10 following cycle 1 azacytidine.  He continues venetoclax.  The neutrophil count and hemoglobin are adequate today.  The platelets are low.  He will receive a platelet transfusion tomorrow.  He will return for labs on 08/22/2020 and 08/25/2020.  He will be scheduled for an office and lab visit on August 29, 2020.  He continues Levaquin prophylaxis.  Mr. Poland is scheduled for follow-up with Dr. Linus Orn on September 01, 2020.  Betsy Coder, MD  08/18/2020  5:27 PM

## 2020-08-19 ENCOUNTER — Telehealth: Payer: Self-pay | Admitting: *Deleted

## 2020-08-19 ENCOUNTER — Inpatient Hospital Stay: Payer: Medicare Other

## 2020-08-19 ENCOUNTER — Other Ambulatory Visit: Payer: Self-pay | Admitting: *Deleted

## 2020-08-19 ENCOUNTER — Other Ambulatory Visit: Payer: Self-pay

## 2020-08-19 DIAGNOSIS — D696 Thrombocytopenia, unspecified: Secondary | ICD-10-CM

## 2020-08-19 DIAGNOSIS — C9500 Acute leukemia of unspecified cell type not having achieved remission: Secondary | ICD-10-CM

## 2020-08-19 DIAGNOSIS — C9200 Acute myeloblastic leukemia, not having achieved remission: Secondary | ICD-10-CM | POA: Diagnosis not present

## 2020-08-19 LAB — ABO/RH: ABO/RH(D): A POS

## 2020-08-19 LAB — PLATELET COUNT: Platelets: 26 10*3/uL — CL (ref 150–400)

## 2020-08-19 MED ORDER — SODIUM CHLORIDE 0.9% IV SOLUTION
250.0000 mL | Freq: Once | INTRAVENOUS | Status: DC
  Filled 2020-08-19: qty 250

## 2020-08-19 MED ORDER — SODIUM CHLORIDE 0.9% IV SOLUTION
250.0000 mL | Freq: Once | INTRAVENOUS | Status: AC
  Administered 2020-08-19: 250 mL via INTRAVENOUS
  Filled 2020-08-19: qty 250

## 2020-08-19 MED ORDER — HEPARIN SOD (PORK) LOCK FLUSH 100 UNIT/ML IV SOLN
250.0000 [IU] | INTRAVENOUS | Status: AC | PRN
  Administered 2020-08-19: 250 [IU]
  Filled 2020-08-19: qty 5

## 2020-08-19 MED ORDER — SODIUM CHLORIDE 0.9% FLUSH
10.0000 mL | INTRAVENOUS | Status: AC | PRN
  Administered 2020-08-19: 20 mL
  Filled 2020-08-19: qty 10

## 2020-08-19 MED ORDER — SODIUM CHLORIDE 0.9% FLUSH
10.0000 mL | Freq: Once | INTRAVENOUS | Status: AC
  Administered 2020-08-19: 10 mL via INTRAVENOUS
  Filled 2020-08-19: qty 10

## 2020-08-19 NOTE — Patient Instructions (Signed)

## 2020-08-19 NOTE — Telephone Encounter (Signed)
Called patient that his platelet count improved to 26,000 today after his infusion. F/U on Monday as scheduled.

## 2020-08-20 LAB — PREPARE PLATELET PHERESIS: Unit division: 0

## 2020-08-20 LAB — BPAM PLATELET PHERESIS
Blood Product Expiration Date: 202110172359
ISSUE DATE / TIME: 202110151433
Unit Type and Rh: 6200

## 2020-08-22 ENCOUNTER — Inpatient Hospital Stay: Payer: Medicare Other

## 2020-08-22 ENCOUNTER — Telehealth: Payer: Self-pay | Admitting: *Deleted

## 2020-08-22 ENCOUNTER — Other Ambulatory Visit: Payer: Self-pay

## 2020-08-22 ENCOUNTER — Other Ambulatory Visit: Payer: Self-pay | Admitting: *Deleted

## 2020-08-22 DIAGNOSIS — D696 Thrombocytopenia, unspecified: Secondary | ICD-10-CM

## 2020-08-22 DIAGNOSIS — Z452 Encounter for adjustment and management of vascular access device: Secondary | ICD-10-CM

## 2020-08-22 DIAGNOSIS — C969 Malignant neoplasm of lymphoid, hematopoietic and related tissue, unspecified: Secondary | ICD-10-CM

## 2020-08-22 DIAGNOSIS — C9200 Acute myeloblastic leukemia, not having achieved remission: Secondary | ICD-10-CM | POA: Diagnosis not present

## 2020-08-22 LAB — MAGNESIUM: Magnesium: 1.6 mg/dL — ABNORMAL LOW (ref 1.7–2.4)

## 2020-08-22 LAB — CMP (CANCER CENTER ONLY)
ALT: 11 U/L (ref 0–44)
AST: 10 U/L — ABNORMAL LOW (ref 15–41)
Albumin: 3.2 g/dL — ABNORMAL LOW (ref 3.5–5.0)
Alkaline Phosphatase: 69 U/L (ref 38–126)
Anion gap: 7 (ref 5–15)
BUN: 22 mg/dL (ref 8–23)
CO2: 27 mmol/L (ref 22–32)
Calcium: 8.7 mg/dL — ABNORMAL LOW (ref 8.9–10.3)
Chloride: 105 mmol/L (ref 98–111)
Creatinine: 1.28 mg/dL — ABNORMAL HIGH (ref 0.61–1.24)
GFR, Estimated: 52 mL/min — ABNORMAL LOW (ref >60)
Glucose, Bld: 111 mg/dL — ABNORMAL HIGH (ref 70–99)
Potassium: 3.6 mmol/L (ref 3.5–5.1)
Sodium: 139 mmol/L (ref 135–145)
Total Bilirubin: 0.9 mg/dL (ref 0.3–1.2)
Total Protein: 6.1 g/dL — ABNORMAL LOW (ref 6.5–8.1)

## 2020-08-22 LAB — CBC WITH DIFFERENTIAL (CANCER CENTER ONLY)
Abs Immature Granulocytes: 0 10*3/uL (ref 0.00–0.07)
Basophils Absolute: 0 10*3/uL (ref 0.0–0.1)
Basophils Relative: 0 %
Eosinophils Absolute: 0 10*3/uL (ref 0.0–0.5)
Eosinophils Relative: 0 %
HCT: 25.4 % — ABNORMAL LOW (ref 39.0–52.0)
Hemoglobin: 8.2 g/dL — ABNORMAL LOW (ref 13.0–17.0)
Lymphocytes Relative: 85 %
Lymphs Abs: 0.9 10*3/uL (ref 0.7–4.0)
MCH: 30 pg (ref 26.0–34.0)
MCHC: 32.3 g/dL (ref 30.0–36.0)
MCV: 93 fL (ref 80.0–100.0)
Monocytes Absolute: 0 10*3/uL — ABNORMAL LOW (ref 0.1–1.0)
Monocytes Relative: 2 %
Neutro Abs: 0.1 10*3/uL — CL (ref 1.7–7.7)
Neutrophils Relative %: 13 %
Platelet Count: 13 10*3/uL — ABNORMAL LOW (ref 150–400)
RBC: 2.73 MIL/uL — ABNORMAL LOW (ref 4.22–5.81)
RDW: 16.6 % — ABNORMAL HIGH (ref 11.5–15.5)
WBC Count: 1 10*3/uL — ABNORMAL LOW (ref 4.0–10.5)
nRBC: 0 % (ref 0.0–0.2)

## 2020-08-22 MED ORDER — SODIUM CHLORIDE 0.9% FLUSH
10.0000 mL | INTRAVENOUS | Status: AC | PRN
  Administered 2020-08-22: 10 mL
  Filled 2020-08-22: qty 10

## 2020-08-22 MED ORDER — SODIUM CHLORIDE 0.9% FLUSH
10.0000 mL | Freq: Once | INTRAVENOUS | Status: AC
  Administered 2020-08-22: 10 mL
  Filled 2020-08-22: qty 10

## 2020-08-22 MED ORDER — HEPARIN SOD (PORK) LOCK FLUSH 100 UNIT/ML IV SOLN
250.0000 [IU] | Freq: Once | INTRAVENOUS | Status: AC
  Administered 2020-08-22: 250 [IU]
  Filled 2020-08-22: qty 5

## 2020-08-22 MED ORDER — SODIUM CHLORIDE 0.9% IV SOLUTION
250.0000 mL | Freq: Once | INTRAVENOUS | Status: DC
  Filled 2020-08-22: qty 250

## 2020-08-22 MED ORDER — HEPARIN SOD (PORK) LOCK FLUSH 100 UNIT/ML IV SOLN
250.0000 [IU] | INTRAVENOUS | Status: AC | PRN
  Administered 2020-08-22: 250 [IU]
  Filled 2020-08-22: qty 5

## 2020-08-22 NOTE — Patient Instructions (Signed)

## 2020-08-22 NOTE — Telephone Encounter (Signed)
CRITICAL VALUE STICKER  CRITICAL VALUE: ANC 0.1  RECEIVER (on-site recipient of call): Lannie Yusuf,RN  DATE & TIME NOTIFIED: 08/22/20 @ 1137 MESSENGER (representative from lab):Jay  MD NOTIFIED: Dr. Benay Spice and critical results called to [facility] nurse and faxed  TIME OF NOTIFICATION: 5277 RESPONSE: Reviewed neutropenic precautions w/patient and to call w/fever, chills or s/s infection. [facility] RN reports they will call him this afternoon w/instructions on the antibiotics he has on hold at home.  Sent scheduling message to get him in for 2 units blood on 08/25/20 since Hgb is trending down.

## 2020-08-23 ENCOUNTER — Telehealth: Payer: Self-pay | Admitting: *Deleted

## 2020-08-23 LAB — BPAM PLATELET PHERESIS
Blood Product Expiration Date: 202110182359
ISSUE DATE / TIME: 202110181219
Unit Type and Rh: 8400

## 2020-08-23 LAB — PREPARE PLATELET PHERESIS: Unit division: 0

## 2020-08-23 LAB — ABO/RH: ABO/RH(D): A POS

## 2020-08-23 NOTE — Telephone Encounter (Signed)
Patient reports he fell against the arm of chair ~ 2 hours ago and is in a lot of pain, especially with movement. Wife states area looks a little swollen, but not discolored. He did take #2 ES Tylenol recently and is asking what to do now. He is asking about wrapping the area? Per Dr. Benay Spice: Continue ES Tylenol every 6 hours and can take Ibuprofen 400 mg in between doses. Apply ice to area and if pain does not improve, needs to go to the ER and be sure to tell them he is a cancer patient and is neutropenic. Patient notified.

## 2020-08-23 NOTE — Telephone Encounter (Signed)
Called patient to confirm [facility] called him about his counts. He reports he was instructed to start the acyclovir, fluconazole, levofloxacin and to decrease the venetoclax to 200 mg daily.

## 2020-08-24 ENCOUNTER — Telehealth: Payer: Self-pay | Admitting: *Deleted

## 2020-08-24 NOTE — Telephone Encounter (Signed)
Called patient to f/u on his rib cage pain since fall yesterday. He reports the pain has lessened with the ice, Tylenol and Ibuprofen. Confirmed his appointments for tomorrow. Needs infusion appointment for platelets at least. High priority scheduling message sent for this tomorrow in infusion or [facility] if possible.

## 2020-08-25 ENCOUNTER — Ambulatory Visit (HOSPITAL_COMMUNITY)
Admission: RE | Admit: 2020-08-25 | Discharge: 2020-08-25 | Disposition: A | Discharge status: Home / Self Care | Payer: Medicare Other | Source: Ambulatory Visit | Attending: Medical | Admitting: Medical

## 2020-08-25 ENCOUNTER — Inpatient Hospital Stay (HOSPITAL_BASED_OUTPATIENT_CLINIC_OR_DEPARTMENT_OTHER): Payer: Medicare Other | Admitting: Medical

## 2020-08-25 ENCOUNTER — Telehealth: Payer: Self-pay | Admitting: *Deleted

## 2020-08-25 ENCOUNTER — Telehealth: Payer: Self-pay | Admitting: Emergency Medicine

## 2020-08-25 ENCOUNTER — Inpatient Hospital Stay: Payer: Medicare Other

## 2020-08-25 ENCOUNTER — Other Ambulatory Visit: Payer: Self-pay | Admitting: *Deleted

## 2020-08-25 ENCOUNTER — Other Ambulatory Visit: Payer: Self-pay

## 2020-08-25 VITALS — BP 140/60 | HR 82 | Temp 98.4°F | Resp 16

## 2020-08-25 DIAGNOSIS — D649 Anemia, unspecified: Secondary | ICD-10-CM

## 2020-08-25 DIAGNOSIS — C9500 Acute leukemia of unspecified cell type not having achieved remission: Secondary | ICD-10-CM

## 2020-08-25 DIAGNOSIS — R079 Chest pain, unspecified: Secondary | ICD-10-CM

## 2020-08-25 DIAGNOSIS — C9200 Acute myeloblastic leukemia, not having achieved remission: Secondary | ICD-10-CM | POA: Diagnosis not present

## 2020-08-25 DIAGNOSIS — Z452 Encounter for adjustment and management of vascular access device: Secondary | ICD-10-CM

## 2020-08-25 DIAGNOSIS — C969 Malignant neoplasm of lymphoid, hematopoietic and related tissue, unspecified: Secondary | ICD-10-CM

## 2020-08-25 DIAGNOSIS — D696 Thrombocytopenia, unspecified: Secondary | ICD-10-CM

## 2020-08-25 LAB — CBC WITH DIFFERENTIAL (CANCER CENTER ONLY)
Abs Immature Granulocytes: 0 10*3/uL (ref 0.00–0.07)
Basophils Absolute: 0 10*3/uL (ref 0.0–0.1)
Basophils Relative: 0 %
Eosinophils Absolute: 0 10*3/uL (ref 0.0–0.5)
Eosinophils Relative: 0 %
HCT: 22.6 % — ABNORMAL LOW (ref 39.0–52.0)
Hemoglobin: 7.3 g/dL — ABNORMAL LOW (ref 13.0–17.0)
Immature Granulocytes: 0 %
Lymphocytes Relative: 79 %
Lymphs Abs: 0.6 10*3/uL — ABNORMAL LOW (ref 0.7–4.0)
MCH: 30 pg (ref 26.0–34.0)
MCHC: 32.3 g/dL (ref 30.0–36.0)
MCV: 93 fL (ref 80.0–100.0)
Monocytes Absolute: 0.1 10*3/uL (ref 0.1–1.0)
Monocytes Relative: 10 %
Neutro Abs: 0.1 10*3/uL — CL (ref 1.7–7.7)
Neutrophils Relative %: 11 %
Platelet Count: 13 10*3/uL — ABNORMAL LOW (ref 150–400)
RBC: 2.43 MIL/uL — ABNORMAL LOW (ref 4.22–5.81)
RDW: 17.3 % — ABNORMAL HIGH (ref 11.5–15.5)
WBC Count: 0.7 10*3/uL — CL (ref 4.0–10.5)
nRBC: 4.2 % — ABNORMAL HIGH (ref 0.0–0.2)

## 2020-08-25 LAB — SAMPLE TO BLOOD BANK

## 2020-08-25 MED ORDER — FUROSEMIDE 10 MG/ML IJ SOLN
INTRAMUSCULAR | Status: AC
  Filled 2020-08-25: qty 2

## 2020-08-25 MED ORDER — SODIUM CHLORIDE 0.9% FLUSH
10.0000 mL | Freq: Once | INTRAVENOUS | Status: AC
  Administered 2020-08-25: 10 mL
  Filled 2020-08-25: qty 10

## 2020-08-25 MED ORDER — FUROSEMIDE 10 MG/ML IJ SOLN
10.0000 mg | Freq: Once | INTRAMUSCULAR | Status: AC
  Administered 2020-08-25: 10 mg via INTRAVENOUS

## 2020-08-25 MED ORDER — HYDROCODONE-ACETAMINOPHEN 5-325 MG PO TABS: ORAL_TABLET | ORAL | 0 refills | Status: DC

## 2020-08-25 MED ORDER — OXYCODONE HCL 5 MG PO TABS
ORAL_TABLET | ORAL | Status: AC
Start: 2020-08-25 — End: ?
  Filled 2020-08-25: qty 1

## 2020-08-25 MED ORDER — HYDROCODONE-ACETAMINOPHEN 5-325MG PREPACK (~~LOC~~: ORAL_TABLET | ORAL | 0 refills | Status: DC

## 2020-08-25 MED ORDER — OXYCODONE HCL 5 MG PO TABS
5.0000 mg | ORAL_TABLET | Freq: Once | ORAL | Status: AC
  Administered 2020-08-25: 5 mg via ORAL

## 2020-08-25 MED ORDER — SODIUM CHLORIDE 0.9% IV SOLUTION
250.0000 mL | Freq: Once | INTRAVENOUS | Status: AC
  Administered 2020-08-25: 250 mL via INTRAVENOUS
  Filled 2020-08-25: qty 250

## 2020-08-25 MED ORDER — HEPARIN SOD (PORK) LOCK FLUSH 100 UNIT/ML IV SOLN
250.0000 [IU] | INTRAVENOUS | Status: AC | PRN
  Administered 2020-08-25: 250 [IU]
  Filled 2020-08-25: qty 5

## 2020-08-25 MED ORDER — SODIUM CHLORIDE 0.9% FLUSH
10.0000 mL | INTRAVENOUS | Status: AC | PRN
  Administered 2020-08-25: 10 mL
  Filled 2020-08-25: qty 10

## 2020-08-25 NOTE — Telephone Encounter (Signed)
Received call report from [facility] for recent Xray of chest/ribs for pt.  Confirmed that impression/scans are in chart, will alert PA Lucianne Lei of result.

## 2020-08-25 NOTE — Telephone Encounter (Signed)
CRITICAL VALUE STICKER  CRITICAL VALUE: WBC 0.7/ANC 0.1, Hgb 7.3, platelets 13,000  RECEIVER (on-site recipient of call):Arcadio Cope,RN  DATE & TIME NOTIFIED: 08/25/20 @ 1135  MESSENGER (representative from lab):Jay  MD NOTIFIED: [name], NP  TIME OF NOTIFICATION: 5025  RESPONSE: @ 1140: arranged for platelet and blood transfusion per [facility] protocol. Will be seen today by Lucianne Lei, PA in [facility] during infusion at patient request for persistent right rib cage pain from fall on 10/19.

## 2020-08-25 NOTE — Telephone Encounter (Signed)
Faxed all labs to [facility] [phone] and called results to Susan,RN at [phone] with patient question regarding when is it OK to have his COVID booster? Per Dr. Linus Orn, hold off on booster at this time, not sure how well it would work since he is so immunocompromised. Will discuss at his office visit there on 09/01/20. Provided patient with his handicap parking form signed by NP.

## 2020-08-25 NOTE — Patient Instructions (Signed)
Platelet Transfusion A platelet transfusion is a procedure in which you receive donated platelets through an IV. Platelets are tiny pieces of blood cells. When you get an injury, platelets clump together in the area to form a blood clot. This helps stop bleeding and is the beginning of the healing process. If you have too few platelets, your blood may have trouble clotting. This may cause you to bleed and bruise very easily. You may need a platelet transfusion if you have a condition that causes a low number of platelets (thrombocytopenia). A platelet transfusion may be used to stop or prevent excessive bleeding. Tell a health care provider about:  Any reactions you have had during previous transfusions.  Any allergies you have.  All medicines you are taking, including vitamins, herbs, eye drops, creams, and over-the-counter medicines.  Any blood disorders you have.  Any surgeries you have had.  Any medical conditions you have.  Whether you are pregnant or may be pregnant. What are the risks? Generally, this is a safe procedure. However, problems may occur, including:  Fever.  Infection.  Allergic reaction to the donor platelets.  Your body's disease-fighting system (immune system) attacking the donor platelets (hemolytic reaction). This is rare.  A rare reaction that causes lung damage (transfusion-related acute lung injury). What happens before the procedure? Medicines  Ask your health care provider about: ? Changing or stopping your regular medicines. This is especially important if you are taking diabetes medicines or blood thinners. ? Taking medicines such as aspirin and ibuprofen. These medicines can thin your blood. Do not take these medicines unless your health care provider tells you to take them. ? Taking over-the-counter medicines, vitamins, herbs, and supplements. General instructions  You will have a blood test to determine your blood type. Your blood type  determines what kind of platelets you will be given.  Follow instructions from your health care provider about eating or drinking restrictions.  If you have had an allergic reaction to a transfusion in the past, you may be given medicine to help prevent a reaction.  Your temperature, blood pressure, pulse, and breathing will be monitored. What happens during the procedure?   An IV will be inserted into one of your veins.  For your safety, two health care providers will verify your identity along with the donor platelets about to be infused.  A bag of donor platelets will be connected to your IV. The platelets will flow into your bloodstream. This usually takes 30-60 minutes.  Your temperature, blood pressure, pulse, and breathing will be monitored during the transfusion. This helps detect early signs of any reaction.  You will also be monitored for other symptoms that may indicate a reaction, including chills, hives, or itching.  If you have signs of a reaction at any time, your transfusion will be stopped, and you may be given medicine to help manage the reaction.  When your transfusion is complete, your IV will be removed.  Pressure may be applied to the IV site for a few minutes to stop any bleeding.  The IV site will be covered with a bandage (dressing). The procedure may vary among health care providers and hospitals. What happens after the procedure?  Your blood pressure, temperature, pulse, and breathing will be monitored until you leave the hospital or clinic.  You may have some bruising and soreness at your IV site. Follow these instructions at home: Medicines  Take over-the-counter and prescription medicines only as told by your health care provider.    Talk with your health care provider before you take any medicines that contain aspirin or NSAIDs. These medicines increase your risk for dangerous bleeding. General instructions  Change or remove your dressing as told  by your health care provider.  Return to your normal activities as told by your health care provider. Ask your health care provider what activities are safe for you.  Do not take baths, swim, or use a hot tub until your health care provider approves. Ask your health care provider if you may take showers.  Check your IV site every day for signs of infection. Check for: ? Redness, swelling, or pain. ? Fluid or blood. If fluid or blood drains from your IV site, use your hands to press down firmly on a bandage covering the area for a minute or two. Doing this should stop the bleeding. ? Warmth. ? Pus or a bad smell.  Keep all follow-up visits as told by your health care provider. This is important. Contact a health care provider if you have:  A headache that does not go away with medicine.  Hives, rash, or itchy skin.  Nausea or vomiting.  Unusual tiredness or weakness.  Signs of infection at your IV site. Get help right away if:  You have a fever or chills.  You urinate less often than usual.  Your urine is darker colored than normal.  You have any of the following: ? Trouble breathing. ? Pain in your back, abdomen, or chest. ? Cool, clammy skin. ? A fast heartbeat. Summary  Platelets are tiny pieces of blood cells that clump together to form a blood clot when you have an injury. If you have too few platelets, your blood may have trouble clotting.  A platelet transfusion is a procedure in which you receive donated platelets through an IV.  A platelet transfusion may be used to stop or prevent excessive bleeding.  After the procedure, check your IV site every day for signs of infection, including redness, swelling, pain, or warmth. This information is not intended to replace advice given to you by your health care provider. Make sure you discuss any questions you have with your health care provider. Document Revised: 11/27/2017 Document Reviewed: 11/27/2017 Elsevier  Patient Education  2020 [employer]   Blood Transfusion, Adult, Care After This sheet gives you information about how to care for yourself after your procedure. Your doctor may also give you more specific instructions. If you have problems or questions, contact your doctor. What can I expect after the procedure? After the procedure, it is common to have:  Bruising and soreness at the IV site.  A fever or chills on the day of the procedure. This may be your body's response to the new blood cells received.  A headache. Follow these instructions at home: Insertion site care      Follow instructions from your doctor about how to take care of your insertion site. This is where an IV tube was put into your vein. Make sure you: ? Wash your hands with soap and water before and after you change your bandage (dressing). If you cannot use soap and water, use hand sanitizer. ? Change your bandage as told by your doctor.  Check your insertion site every day for signs of infection. Check for: ? Redness, swelling, or pain. ? Bleeding from the site. ? Warmth. ? Pus or a bad smell. General instructions  Take over-the-counter and prescription medicines only as told by your doctor.  Rest   as told by your doctor.  Go back to your normal activities as told by your doctor.  Keep all follow-up visits as told by your doctor. This is important. Contact a doctor if:  You have itching or red, swollen areas of skin (hives).  You feel worried or nervous (anxious).  You feel weak after doing your normal activities.  You have redness, swelling, warmth, or pain around the insertion site.  You have blood coming from the insertion site, and the blood does not stop with pressure.  You have pus or a bad smell coming from the insertion site. Get help right away if:  You have signs of a serious reaction. This may be coming from an allergy or the body's defense system (immune system). Signs  include: ? Trouble breathing or shortness of breath. ? Swelling of the face or feeling warm (flushed). ? Fever or chills. ? Head, chest, or back pain. ? Dark pee (urine) or blood in the pee. ? Widespread rash. ? Fast heartbeat. ? Feeling dizzy or light-headed. You may receive your blood transfusion in an outpatient setting. If so, you will be told whom to contact to report any reactions. These symptoms may be an emergency. Do not wait to see if the symptoms will go away. Get medical help right away. Call your local emergency services (911 in the [place] Do not drive yourself to the hospital. Summary  Bruising and soreness at the IV site are common.  Check your insertion site every day for signs of infection.  Rest as told by your doctor. Go back to your normal activities as told by your doctor.  Get help right away if you have signs of a serious reaction. This information is not intended to replace advice given to you by your health care provider. Make sure you discuss any questions you have with your health care provider. Document Revised: 04/16/2019 Document Reviewed: 04/16/2019 Elsevier Patient Education  2020 [employer]  

## 2020-08-26 ENCOUNTER — Other Ambulatory Visit: Payer: Self-pay | Admitting: *Deleted

## 2020-08-26 ENCOUNTER — Other Ambulatory Visit: Payer: Medicare Other

## 2020-08-26 ENCOUNTER — Telehealth: Payer: Self-pay | Admitting: *Deleted

## 2020-08-26 ENCOUNTER — Other Ambulatory Visit: Payer: Self-pay

## 2020-08-26 ENCOUNTER — Inpatient Hospital Stay: Payer: Medicare Other

## 2020-08-26 DIAGNOSIS — C9500 Acute leukemia of unspecified cell type not having achieved remission: Secondary | ICD-10-CM

## 2020-08-26 DIAGNOSIS — D696 Thrombocytopenia, unspecified: Secondary | ICD-10-CM

## 2020-08-26 DIAGNOSIS — C9200 Acute myeloblastic leukemia, not having achieved remission: Secondary | ICD-10-CM | POA: Diagnosis not present

## 2020-08-26 DIAGNOSIS — Z452 Encounter for adjustment and management of vascular access device: Secondary | ICD-10-CM

## 2020-08-26 LAB — TYPE AND SCREEN
ABO/RH(D): A POS
Antibody Screen: NEGATIVE
Unit division: 0
Unit division: 0
Unit division: 0
Unit division: 0

## 2020-08-26 LAB — CBC WITH DIFFERENTIAL (CANCER CENTER ONLY)
Abs Immature Granulocytes: 0 10*3/uL (ref 0.00–0.07)
Basophils Absolute: 0 10*3/uL (ref 0.0–0.1)
Basophils Relative: 0 %
Eosinophils Absolute: 0 10*3/uL (ref 0.0–0.5)
Eosinophils Relative: 0 %
HCT: 26.6 % — ABNORMAL LOW (ref 39.0–52.0)
Hemoglobin: 8.9 g/dL — ABNORMAL LOW (ref 13.0–17.0)
Immature Granulocytes: 0 %
Lymphocytes Relative: 81 %
Lymphs Abs: 0.5 10*3/uL — ABNORMAL LOW (ref 0.7–4.0)
MCH: 29.8 pg (ref 26.0–34.0)
MCHC: 33.5 g/dL (ref 30.0–36.0)
MCV: 89 fL (ref 80.0–100.0)
Monocytes Absolute: 0 10*3/uL — ABNORMAL LOW (ref 0.1–1.0)
Monocytes Relative: 6 %
Neutro Abs: 0.1 10*3/uL — CL (ref 1.7–7.7)
Neutrophils Relative %: 13 %
Platelet Count: 24 10*3/uL — ABNORMAL LOW (ref 150–400)
RBC: 2.99 MIL/uL — ABNORMAL LOW (ref 4.22–5.81)
RDW: 18.6 % — ABNORMAL HIGH (ref 11.5–15.5)
WBC Count: 0.7 10*3/uL — CL (ref 4.0–10.5)
nRBC: 3 % — ABNORMAL HIGH (ref 0.0–0.2)

## 2020-08-26 LAB — BPAM RBC
Blood Product Expiration Date: 202111032359
Blood Product Expiration Date: 202111032359
Blood Product Expiration Date: 202111182359
Blood Product Expiration Date: 202111182359
ISSUE DATE / TIME: 202110211249
ISSUE DATE / TIME: 202110211249
ISSUE DATE / TIME: 202110211308
Unit Type and Rh: 5100
Unit Type and Rh: 5100
Unit Type and Rh: 6200
Unit Type and Rh: 6200

## 2020-08-26 LAB — PREPARE PLATELET PHERESIS: Unit division: 0

## 2020-08-26 LAB — BPAM PLATELET PHERESIS
Blood Product Expiration Date: 202110232359
ISSUE DATE / TIME: 202110211233
Unit Type and Rh: 9500

## 2020-08-26 MED ORDER — SODIUM CHLORIDE 0.9% FLUSH
10.0000 mL | Freq: Once | INTRAVENOUS | Status: AC
  Administered 2020-08-26: 10 mL
  Filled 2020-08-26: qty 10

## 2020-08-26 MED ORDER — HEPARIN SOD (PORK) LOCK FLUSH 100 UNIT/ML IV SOLN
250.0000 [IU] | Freq: Once | INTRAVENOUS | Status: AC
  Administered 2020-08-26: 250 [IU]
  Filled 2020-08-26: qty 5

## 2020-08-26 NOTE — Progress Notes (Signed)
Per Dr. Benay Spice: Needs CBC today to ensure he does not need platelets before Monday's appointment. High priority scheduling message sent.

## 2020-08-26 NOTE — Telephone Encounter (Signed)
-----   Message from Ladell Pier, MD sent at 08/25/2020  4:22 PM EDT ----- Should get rbcs and plts, repeat cbc 10/22

## 2020-08-26 NOTE — Progress Notes (Signed)
Lab orders entered

## 2020-08-26 NOTE — Telephone Encounter (Signed)
Called patient to f/u on why he is not here yet--was due at 2pm. He reports no one called him to come in today. He is on the way and will be here in 15 minutes.

## 2020-08-26 NOTE — Telephone Encounter (Signed)
Received call from [facility] lab with [place]  Result of 0.1; platelet count of 24k . Made [name] , NP made aware.

## 2020-08-26 NOTE — Telephone Encounter (Signed)
Left VM for patient that Dr. Benay Spice wants him to have labs checked today before the weekend in case platelets have dropped again. High priority scheduling message sent to work on appointment and bring him in at 2 pm at the latest.

## 2020-08-26 NOTE — Patient Instructions (Signed)
PICC Home Care Guide  A peripherally inserted central catheter (PICC) is a form of IV access that allows medicines and IV fluids to be quickly distributed throughout the body. The PICC is a long, thin, flexible tube (catheter) that is inserted into a vein in the upper arm. The catheter ends in a large vein in the chest (superior vena cava, or SVC). After the PICC is inserted, a chest X-ray may be done to make sure that it is in the correct place. A PICC may be placed for different reasons, such as:  To give medicines and liquid nutrition.  To give IV fluids and blood products.  If there is trouble placing a peripheral intravenous (PIV) catheter. If taken care of properly, a PICC can remain in place for several months. Having a PICC can also allow a person to go home from the hospital sooner. Medicine and PICC care can be managed at home by a family member, caregiver, or home health care team. What are the risks? Generally, having a PICC is safe. However, problems may occur, including:  A blood clot (thrombus) forming in or at the tip of the PICC.  A blood clot forming in a vein (deep vein thrombosis) or traveling to the lung (pulmonary embolism).  Inflammation of the vein (phlebitis) in which the PICC is placed.  Infection. Central line associated blood stream infection (CLABSI) is a serious infection that often requires hospitalization.  PICC movement (malposition). The PICC tip may move from its original position due to excessive physical activity, forceful coughing, sneezing, or vomiting.  A break or cut in the PICC. It is important not to use scissors near the PICC.  Nerve or tendon irritation or injury during PICC insertion. How to take care of your PICC Preventing problems  You and any caregivers should wash your hands often with soap. Wash hands: ? Before touching the PICC line or the infusion device. ? Before changing a bandage (dressing).  Flush the PICC as told by your  health care provider. Let your health care provider know right away if the PICC is hard to flush or does not flush. Do not use force to flush the PICC.  Do not use a syringe that is less than 10 mL to flush the PICC.  Avoid blood pressure checks on the arm in which the PICC is placed.  Never pull or tug on the PICC.  Do not take the PICC out yourself. Only a trained clinical professional should remove the PICC.  Use clean and sterile supplies only. Keep the supplies in a dry place. Do not reuse needles, syringes, or any other supplies. Doing that can lead to infection.  Keep pets and children away from your PICC line.  Check the PICC insertion site every day for signs of infection. Check for: ? Leakage. ? Redness, swelling, or pain. ? Fluid or blood. ? Warmth. ? Pus or a bad smell. PICC dressing care  Keep your PICC bandage (dressing) clean and dry to prevent infection.  Do not take baths, swim, or use a hot tub until your health care provider approves. Ask your health care provider if you can take showers. You may only be allowed to take sponge baths for bathing. When you are allowed to shower: ? Ask your health care provider to teach you how to wrap the PICC line. ? Cover the PICC line with clear plastic wrap and tape to keep it dry while showering.  Follow instructions from your health care provider   about how to take care of your insertion site and dressing. Make sure you: ? Wash your hands with soap and water before you change your bandage (dressing). If soap and water are not available, use hand sanitizer. ? Change your dressing as told by your health care provider. ? Leave stitches (sutures), skin glue, or adhesive strips in place. These skin closures may need to stay in place for 2 weeks or longer. If adhesive strip edges start to loosen and curl up, you may trim the loose edges. Do not remove adhesive strips completely unless your health care provider tells you to do  that.  Change your PICC dressing if it becomes loose or wet. General instructions   Carry your PICC identification card or wear a medical alert bracelet at all times.  Keep the tube clamped at all times, unless it is being used.  Carry a smooth-edge clamp with you at all times to place on the tube if it breaks.  Do not use scissors or sharp objects near the tube.  You may bend your arm and move it freely. If your PICC is near or at the bend of your elbow, avoid activity with repeated motion at the elbow.  Avoid lifting heavy objects as told by your health care provider.  Keep all follow-up visits as told by your health care provider. This is important. Disposal of supplies  Throw away any syringes in a disposal container that is meant for sharp items (sharps container). You can buy a sharps container from a pharmacy, or you can make one by using an empty hard plastic bottle with a cover.  Place any used dressings or infusion bags into a plastic bag. Throw that bag in the trash. Contact a health care provider if:  You have pain in your arm, ear, face, or teeth.  You have a fever or chills.  You have redness, swelling, or pain around the insertion site.  You have fluid or blood coming from the insertion site.  Your insertion site feels warm to the touch.  You have pus or a bad smell coming from the insertion site.  Your skin feels hard and raised around the insertion site. Get help right away if:  Your PICC is accidentally pulled all the way out. If this happens, cover the insertion site with a bandage or gauze dressing. Do not throw the PICC away. Your health care provider will need to check it.  Your PICC was tugged or pulled and has partially come out. Do not  push the PICC back in.  You cannot flush the PICC, it is hard to flush, or the PICC leaks around the insertion site when it is flushed.  You hear a "flushing" sound when the PICC is flushed.  You feel your  heart racing or skipping beats.  There is a hole or tear in the PICC.  You have swelling in the arm in which the PICC was inserted.  You have a red streak going up your arm from where the PICC was inserted. Summary  A peripherally inserted central catheter (PICC) is a long, thin, flexible tube (catheter) that is inserted into a vein in the upper arm.  The PICC is inserted using a sterile technique by a specially trained nurse or physician. Only a trained clinical professional should remove it.  Keep your PICC identification card with you at all times.  Avoid blood pressure checks on the arm in which the PICC is placed.  If cared for   properly, a PICC can remain in place for several months. Having a PICC can also allow a person to go home from the hospital sooner. This information is not intended to replace advice given to you by your health care provider. Make sure you discuss any questions you have with your health care provider. Document Revised: 10/04/2017 Document Reviewed: 11/24/2016 Elsevier Patient Education  2020 [employer]  

## 2020-08-28 NOTE — Progress Notes (Addendum)
[place]   [phone] [phone]   Clinic Follow up Note   Patient Care Team: Ronnald Nian, DO as PCP - General (Family Medicine) Eppie Gibson, MD as Attending Physician (Radiation Oncology) Leota Sauers, RN (Inactive) as Oncology Nurse Navigator Karie Mainland, RD as Dietitian (Nutrition) Karin Golden, MD as Referring Physician (Dermatology) 08/29/2020  CHIEF COMPLAINT: F/u AML  CURRENT THERAPY: Azacitadine and venetoclax, starting 08/09/20  INTERVAL HISTORY: [name] returns for f/u and treatment as scheduled, day 21 Vidaza. He was last seen by Dr. Benay Spice on 08/18/20, received plt transfusions on 10/15, 10/18, and RBCs/plts on 10/21.  He continues venetoclax 200 mg daily and prophylactic acyclovir, fluconazole, and Levaquin.  He gets dizzy nearly every time he stands up, he had a fall last week and "went down hard."  He has severe bruising on his right flank.  X-ray on 10/22 showed 10th rib fracture.  He is taking hydrocodone 1 tab every 6 hours which is helping.  He is slightly constipated.  He feels well otherwise except for the pain.  Intermittent cough is stable, no chest pain, dyspnea, fever or chills.  Since he left the hospital his leg edema and tingling in the left hand resolved, he has mild tingling in the right hand but remains functional, not dropping things.  He has a weak urine stream, denies dysuria, urgency, or frequency.  He feels he is able to completely empty his bladder.  He thinks he is well-hydrated. Denies any bleeding, nausea/vomiting.    MEDICAL HISTORY:  Past Medical History:  Diagnosis Date  . Arthritis   . Atherosclerosis   . Atherosclerotic PVD with intermittent claudication (HCC)    stent left leg  . BPH (benign prostatic hyperplasia)   . Bulging lumbar disc   . Cancer ([place]) 11/12/14   vocal cord  carcinoma in situ , radiation; thyroid cancer  . Carotid bruit   . Chronic kidney disease    chronic  stage III  . COPD (chronic obstructive pulmonary disease) ([place])   . DDD (degenerative disc disease), lumbar   . Dysphonia   . Dysplasia of true vocal cord   . GERD (gastroesophageal reflux disease)   . H/O carotid atherosclerosis    b/l  . Hyperlipidemia   . Hypothyroidism   . Occasional tremors    left hand managed with propranolol  . Peripheral vascular angioplasty status with implants and grafts   . Precancerous lesion 03/05/2018   premelanoma removed from back.   . Radiation 03/10/15- 04/18/16   Larynx  . Sleep apnea    hasn't used CPAP in years  . Spondylosis of lumbosacral region   . Thyroid disease     SURGICAL HISTORY: Past Surgical History:  Procedure Laterality Date  . APPENDECTOMY    . COLONOSCOPY W/ POLYPECTOMY    . DIODE LASER APPLICATION Left 94/05/6807   Procedure: DIODE LASER APPLICATION;  Surgeon: Hayden Pedro, MD;  Location: [facility];  Service: Ophthalmology;  Laterality: Left;  . MEMBRANE PEEL Left 08/18/2019   Procedure: MEMBRANE PEEL;  Surgeon: Hayden Pedro, MD;  Location: [facility];  Service: Ophthalmology;  Laterality: Left;  . MOUTH SURGERY     tooth extraction  . PARS PLANA VITRECTOMY Left 08/18/2019   PARS PLANA VITRECTOMY 27 GAUGE (Left)  . PARS PLANA VITRECTOMY 27 GAUGE Left 08/18/2019   Procedure: PARS PLANA VITRECTOMY 27 GAUGE;  Surgeon: Hayden Pedro, MD;  Location: [facility];  Service: Ophthalmology;  Laterality:  Left;  . PHOTOCOAGULATION WITH LASER Left 08/18/2019   Procedure: PHOTOCOAGULATION WITH LASER;  Surgeon: Hayden Pedro, MD;  Location: [facility];  Service: Ophthalmology;  Laterality: Left;  . THYROID SURGERY    . TONSILLECTOMY    . vocal cord biopsy  11/12/14   squamous cell carcinoma in situ    I have reviewed the social history and family history with the patient and they are unchanged from previous note.  ALLERGIES:  has No Known Allergies.  MEDICATIONS:  Current Outpatient Medications  Medication Sig Dispense Refill  .  acetaminophen (TYLENOL) 500 MG tablet Take 1,000 mg by mouth every 6 (six) hours as needed.    Marland Kitchen albuterol (VENTOLIN HFA) 108 (90 Base) MCG/ACT inhaler Inhale 1-2 puffs into the lungs every 6 (six) hours as needed for wheezing or shortness of breath.    . doxazosin (CARDURA) 2 MG tablet Take 1 tablet (2 mg total) by mouth at bedtime. 90 tablet 3  . fluticasone (FLONASE) 50 MCG/ACT nasal spray Place 2 sprays into the nose 2 (two) times daily as needed.    . furosemide (LASIX) 40 MG tablet Take 20 mg by mouth daily.    Marland Kitchen HEMP OIL-VANILLYL BUTYL ETHER EX Apply topically daily.    Marland Kitchen HYDROcodone-acetaminophen (NORCO) 5-325 MG tablet 1/2 to 1 tablet PO Q 6 hours prn pain 30 tablet 0  . ibuprofen (ADVIL) 200 MG tablet Take 400 mg by mouth every 6 (six) hours as needed.    Marland Kitchen levofloxacin (LEVAQUIN) 500 MG tablet Take 500 mg by mouth daily.    Marland Kitchen levothyroxine (SYNTHROID) 175 MCG tablet Take 175 mcg by mouth daily.    . Loratadine (CLARITIN PO) Take 10 mg by mouth at bedtime. Allertec    . propranolol ER (INDERAL LA) 80 MG 24 hr capsule Take 1 capsule (80 mg total) by mouth daily. 90 capsule 3  . simvastatin (ZOCOR) 40 MG tablet Take 1 tablet (40 mg total) by mouth at bedtime. 90 tablet 3  . venetoclax 100 MG TABS Take 400 mg by mouth daily.    Marland Kitchen acyclovir (ZOVIRAX) 400 MG tablet Take 400 mg by mouth 2 (two) times daily as needed. (Patient not taking: Reported on 08/18/2020)    . fluconazole (DIFLUCAN) 200 MG tablet Take 200 mg by mouth daily as needed. (Patient not taking: Reported on 08/18/2020)    . Melatonin 5 MG CHEW Chew 1 tablet by mouth daily as needed. (Patient not taking: Reported on 08/18/2020)    . omeprazole (PRILOSEC) 20 MG capsule Take 1 capsule (20 mg total) by mouth 2 (two) times daily before a meal. 180 capsule 3   Current Facility-Administered Medications  Medication Dose Route Frequency Provider Last Rate Last Admin  . 0.9 %  sodium chloride infusion   Intravenous Once Alla Feeling,  NP 500 mL/hr at 08/29/20 1308 New Bag at 08/29/20 1308    PHYSICAL EXAMINATION:  Vitals:   08/29/20 1119  BP: (!) 122/55  Pulse: 87  Resp: 18  Temp: 98.1 F (36.7 C)  SpO2: 95%    Orthostatic BPs: Sitting 107/53 Standing 79/42  Filed Weights   08/29/20 1119  Weight: 200 lb 11.2 oz (91 kg)    GENERAL:alert, no distress and comfortable SKIN: Scattered ecchymoses over the bilateral forearms. Petechiae over the lower legs.   Hematoma to the right flank.  Poor skin turgor.  No obvious rash EYES: sclera clear OROPHARYNX: No thrush or ulcers NECK: Without mass LUNGS: clear with normal breathing  effort HEART: regular rate & rhythm, no lower extremity edema Musculoskeletal: Focal tenderness over the right lower posterior ribs NEURO: alert & oriented x 3 with fluent speech Right upper arm PICC without erythema  LABORATORY DATA:  I have reviewed the data as listed CBC Latest Ref Rng & Units 08/29/2020 08/26/2020 08/25/2020  WBC 4.0 - 10.5 K/uL 0.8(LL) 0.7(LL) 0.7(LL)  Hemoglobin 13.0 - 17.0 g/dL 9.1(L) 8.9(L) 7.3(L)  Hematocrit 39 - 52 % 27.8(L) 26.6(L) 22.6(L)  Platelets 150 - 400 K/uL 27(L) 24(L) 13(L)     CMP Latest Ref Rng & Units 08/29/2020 08/22/2020 08/06/2020  Glucose 70 - 99 mg/dL 100(H) 111(H) 116(H)  BUN 8 - 23 mg/dL 20 22 30(H)  Creatinine 0.61 - 1.24 mg/dL 1.28(H) 1.28(H) 1.43(H)  Sodium 135 - 145 mmol/L 136 139 142  Potassium 3.5 - 5.1 mmol/L 3.5 3.6 3.8  Chloride 98 - 111 mmol/L 100 105 109  CO2 22 - 32 mmol/L _0 Calcium 8.9 - 10.3 mg/dL 8.8(L) 8.7(L) 6.5(L)  Total Protein 6.5 - 8.1 g/dL 6.2(L) 6.1(L) 5.6(L)  Total Bilirubin 0.3 - 1.2 mg/dL 1.1 0.9 1.2  Alkaline Phos 38 - 126 U/L 69 69 52  AST 15 - 41 U/L 10(L) 10(L) 12(L)  ALT 0 - 44 U/L _1 RADIOGRAPHIC STUDIES: I have personally reviewed the radiological images as listed and agreed with the findings in the report. No results found.   ASSESSMENT & PLAN:  1.  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  Pancytopenia secondary to #1 and chemotherapy 3.  Right PICC placed August 08, 2020 4.  TTE August 08, 2020-EF 60-65% 5.  Fever during hospital admission October 2021, suspicion of pneumonia, treated with cefepime and discharged on Levaquin 6.  Hypervolemia with respiratory failure during hospital admission October 2021-diuresis, home Lasix 7.  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  Acute renal failure October 2021-improved 9. Fall 10/21, right 10th rib fracture 10. Orthostatic hypotension   Disposition:  Mr. Creelman appears stable. He is C1D21 Vidaza and continues venetoclax. He has persistent severe neutropenia. The hemoglobin and platelet count are adequate today. He does not need transfusions. He continues acyclovir, fluconazole, and levaquin prophylaxis.   He appears dehydrated with orthostatic hypotension and recent fall. He sustained right rib fracture, managed with vocidin. He agrees to supportive care IV fluids today. He has been instructed to hold lasix.   Mr. Maulden is scheduled for f/u with Dr. Linus Orn this week 10/29. We will see him back with lab and f/u on 11/2. He knows to call sooner if he develops fever, chills, bleeding, respiratory changes or other signs of infection.   The patient was seen with Dr. Benay Spice.    Orders Placed This Encounter  Procedures  . CBC with Differential (Cancer Center Only)    Standing Status:   Future    Standing Expiration Date:   08/29/2021  . CBC with Differential (Cancer Center Only)    Standing Status:   Future    Standing  Expiration Date:   08/29/2021  . CMP ([facility] only)    Standing Status:   Future    Standing Expiration  Date:   08/29/2021  . Magnesium    Standing Status:   Future    Standing Expiration Date:   08/29/2021  . Sample to Blood Bank    Standing Status:   Future    Standing Expiration Date:   08/29/2021   All questions were answered. The patient knows to call the clinic with any problems, questions or concerns. No barriers to learning were detected.     Alla Feeling, NP 08/29/20   This was a shared visit with Cira Rue.  Mr. Marple was interviewed and examined.  He is now at day 21 following cycle 1 5 azacytidine.  He continues venetoclax.  He has persistent pancytopenia.  He remains on antibiotic prophylaxis.  There is no indication for transfusion support today.  He fell last week and suffered a rib fracture.  He will follow up at [facility] later this week.  We recommended he discontinue Lasix.  [name], MD

## 2020-08-29 ENCOUNTER — Inpatient Hospital Stay: Payer: Medicare Other

## 2020-08-29 ENCOUNTER — Other Ambulatory Visit: Payer: Self-pay

## 2020-08-29 ENCOUNTER — Inpatient Hospital Stay (HOSPITAL_BASED_OUTPATIENT_CLINIC_OR_DEPARTMENT_OTHER): Payer: Medicare Other | Admitting: Nurse Practitioner

## 2020-08-29 ENCOUNTER — Encounter: Payer: Self-pay | Admitting: Nurse Practitioner

## 2020-08-29 VITALS — BP 122/55 | HR 87 | Temp 98.1°F | Resp 18 | Ht 70.0 in | Wt 200.7 lb

## 2020-08-29 VITALS — BP 110/45 | HR 73 | Temp 98.7°F | Resp 18

## 2020-08-29 DIAGNOSIS — C9500 Acute leukemia of unspecified cell type not having achieved remission: Secondary | ICD-10-CM

## 2020-08-29 DIAGNOSIS — I951 Orthostatic hypotension: Secondary | ICD-10-CM

## 2020-08-29 DIAGNOSIS — C9200 Acute myeloblastic leukemia, not having achieved remission: Secondary | ICD-10-CM | POA: Diagnosis not present

## 2020-08-29 DIAGNOSIS — Z452 Encounter for adjustment and management of vascular access device: Secondary | ICD-10-CM

## 2020-08-29 DIAGNOSIS — C969 Malignant neoplasm of lymphoid, hematopoietic and related tissue, unspecified: Secondary | ICD-10-CM

## 2020-08-29 LAB — SAMPLE TO BLOOD BANK

## 2020-08-29 LAB — CBC WITH DIFFERENTIAL (CANCER CENTER ONLY)
Abs Immature Granulocytes: 0.02 10*3/uL (ref 0.00–0.07)
Basophils Absolute: 0 10*3/uL (ref 0.0–0.1)
Basophils Relative: 0 %
Eosinophils Absolute: 0 10*3/uL (ref 0.0–0.5)
Eosinophils Relative: 0 %
HCT: 27.8 % — ABNORMAL LOW (ref 39.0–52.0)
Hemoglobin: 9.1 g/dL — ABNORMAL LOW (ref 13.0–17.0)
Immature Granulocytes: 2 %
Lymphocytes Relative: 80 %
Lymphs Abs: 0.7 10*3/uL (ref 0.7–4.0)
MCH: 29.5 pg (ref 26.0–34.0)
MCHC: 32.7 g/dL (ref 30.0–36.0)
MCV: 90.3 fL (ref 80.0–100.0)
Monocytes Absolute: 0.1 10*3/uL (ref 0.1–1.0)
Monocytes Relative: 6 %
Neutro Abs: 0.1 10*3/uL — CL (ref 1.7–7.7)
Neutrophils Relative %: 12 %
Platelet Count: 27 10*3/uL — ABNORMAL LOW (ref 150–400)
RBC: 3.08 MIL/uL — ABNORMAL LOW (ref 4.22–5.81)
RDW: 18.2 % — ABNORMAL HIGH (ref 11.5–15.5)
WBC Count: 0.8 10*3/uL — CL (ref 4.0–10.5)
nRBC: 2.4 % — ABNORMAL HIGH (ref 0.0–0.2)

## 2020-08-29 LAB — CMP (CANCER CENTER ONLY)
ALT: 8 U/L (ref 0–44)
AST: 10 U/L — ABNORMAL LOW (ref 15–41)
Albumin: 3.6 g/dL (ref 3.5–5.0)
Alkaline Phosphatase: 69 U/L (ref 38–126)
Anion gap: 6 (ref 5–15)
BUN: 20 mg/dL (ref 8–23)
CO2: 30 mmol/L (ref 22–32)
Calcium: 8.8 mg/dL — ABNORMAL LOW (ref 8.9–10.3)
Chloride: 100 mmol/L (ref 98–111)
Creatinine: 1.28 mg/dL — ABNORMAL HIGH (ref 0.61–1.24)
GFR, Estimated: 56 mL/min — ABNORMAL LOW (ref >60)
Glucose, Bld: 100 mg/dL — ABNORMAL HIGH (ref 70–99)
Potassium: 3.5 mmol/L (ref 3.5–5.1)
Sodium: 136 mmol/L (ref 135–145)
Total Bilirubin: 1.1 mg/dL (ref 0.3–1.2)
Total Protein: 6.2 g/dL — ABNORMAL LOW (ref 6.5–8.1)

## 2020-08-29 LAB — MAGNESIUM: Magnesium: 1.7 mg/dL (ref 1.7–2.4)

## 2020-08-29 MED ORDER — SODIUM CHLORIDE 0.9 % IV SOLN
Freq: Once | INTRAVENOUS | Status: AC
  Filled 2020-08-29: qty 250

## 2020-08-29 MED ORDER — SODIUM CHLORIDE 0.9% FLUSH
10.0000 mL | Freq: Once | INTRAVENOUS | Status: AC
  Administered 2020-08-29: 10 mL
  Filled 2020-08-29: qty 10

## 2020-08-29 MED ORDER — HEPARIN SOD (PORK) LOCK FLUSH 100 UNIT/ML IV SOLN
250.0000 [IU] | Freq: Once | INTRAVENOUS | Status: AC
  Administered 2020-08-29: 250 [IU]
  Filled 2020-08-29: qty 5

## 2020-08-29 NOTE — Patient Instructions (Signed)

## 2020-08-29 NOTE — Progress Notes (Signed)
[facility] Progress Note   Victor [name] [id number] [birth year] 84 y.o.  Victor [name] is managed by Dr. Dominica Severin B. Sherrill  Actively treated with chemotherapy/immunotherapy/hormonal therapy: yes  Current therapy: Venetoclax 400 mg daily and supportive care with transfusions as needed.  Next scheduled appointment with provider: 08/29/2020  Assessment: Plan:    Right-sided chest pain - Plan: DG Ribs Unilateral W/Chest Right, oxyCODONE (Oxy IR/ROXICODONE) immediate release tablet 5 mg  Acute leukemia not having achieved remission ([place])   Right-sided chest pain status post fall: The patient was referred for an x-ray of his ribs and chest which returned showing:  FINDINGS: Frontal chest as well as oblique and cone-down rib images were obtained.  There are displaced fractures of the anterior and posterior aspects of the right tenth rib.  There is a small right pleural effusion but no pneumothorax.  There is bibasilar atelectasis.  Heart size and pulmonary vascularity are normal. Central catheter tip is in the superior vena cava.  There is aortic atherosclerosis.  IMPRESSION: Mildly displaced fractures of the anterior and posterior aspects of the right tenth rib.  Small right pleural effusion but no pneumothorax. Bibasilar atelectasis.  Heart size normal.  The patient was given oxycodone 5 mg p.o. x1.  He additionally was given a prescription for Vicodin 5-325 with instructions that he can take 1/2-1 every 6 hours as needed for pain.   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 Dominica Severin B. Sherrill and is currently treated with Venetoclax 400 mg daily and supportive care with transfusions as needed.  He is scheduled to be seen in follow-up next on 08/29/2020.   Please see After Visit Summary for patient specific instructions.  Future Appointments  Date Time Provider [place]  08/31/2020  2:30 PM Ronnald Nian, [place]  [facility]  10/04/2020 10:30 AM Rosemarie Ax, MD [facility]  11/24/2020 12:15 PM Hayden Pedro, MD [facility] None    Orders Placed This Encounter  Procedures  . DG Ribs Unilateral W/Chest Right       Subjective:   Patient ID:  Victor [name] is a 84 y.o. (DOB 08-Mar-1936) male.  Chief Complaint: No chief complaint on file.   HPI Victor [name] is a 84 y.o. male with a diagnosis of an acute myelocytic leukemia.  He is managed by Dr. Dominica Severin B. Sherrill and is currently treated with Venetoclax 400 mg daily and supportive care with transfusions as needed.  He was seen in the infusion room today after he had a fall at home.  He fell and hit his right lateral chest on a countertop.  He is having significant bruising at that site and significant pain.  His vital signs are stable with no evidence of oxygen desaturation on room air.  He denies shortness of breath but does report pain with deep breathing.   Medications: I have reviewed the patient's current medications.  Allergies: No Known Allergies  Past Medical History:  Diagnosis Date  . Arthritis   . Atherosclerosis   . Atherosclerotic PVD with intermittent claudication (HCC)    stent left leg  . BPH (benign prostatic hyperplasia)   . Bulging lumbar disc   . Cancer ([place]) 11/12/14   vocal cord  carcinoma in situ , radiation; thyroid cancer  . Carotid bruit   . Chronic kidney disease    chronic stage III  . COPD (chronic obstructive pulmonary disease) ([place])   . DDD (degenerative disc disease),  lumbar   . Dysphonia   . Dysplasia of true vocal cord   . GERD (gastroesophageal reflux disease)   . H/O carotid atherosclerosis    b/l  . Hyperlipidemia   . Hypothyroidism   . Occasional tremors    left hand managed with propranolol  . Peripheral vascular angioplasty status with implants and grafts   . Precancerous lesion 03/05/2018   premelanoma removed from back.   . Radiation 03/10/15- 04/18/16   Larynx  . Sleep apnea     hasn't used CPAP in years  . Spondylosis of lumbosacral region   . Thyroid disease     Past Surgical History:  Procedure Laterality Date  . APPENDECTOMY    . COLONOSCOPY W/ POLYPECTOMY    . DIODE LASER APPLICATION Left 23/55/7322   Procedure: DIODE LASER APPLICATION;  Surgeon: Hayden Pedro, MD;  Location: [place];  Service: Ophthalmology;  Laterality: Left;  . MEMBRANE PEEL Left 08/18/2019   Procedure: MEMBRANE PEEL;  Surgeon: Hayden Pedro, MD;  Location: [place];  Service: Ophthalmology;  Laterality: Left;  . MOUTH SURGERY     tooth extraction  . PARS PLANA VITRECTOMY Left 08/18/2019   PARS PLANA VITRECTOMY 27 GAUGE (Left)  . PARS PLANA VITRECTOMY 27 GAUGE Left 08/18/2019   Procedure: PARS PLANA VITRECTOMY 27 GAUGE;  Surgeon: Hayden Pedro, MD;  Location: [place];  Service: Ophthalmology;  Laterality: Left;  . PHOTOCOAGULATION WITH LASER Left 08/18/2019   Procedure: PHOTOCOAGULATION WITH LASER;  Surgeon: Hayden Pedro, MD;  Location: [place];  Service: Ophthalmology;  Laterality: Left;  . THYROID SURGERY    . TONSILLECTOMY    . vocal cord biopsy  11/12/14   squamous cell carcinoma in situ    Family History  Problem Relation Age of Onset  . Colon cancer Father   . Cancer Sister        Died of metastatic cancer  . Leukemia Neg Hx     Social History   Socioeconomic History  . Marital status: Married    Spouse name: Not on file  . Number of children: Not on file  . Years of education: Not on file  . Highest education level: Not on file  Occupational History  . Not on file  Tobacco Use  . Smoking status: Former Smoker    Packs/day: 2.00    Years: 51.00    Pack years: 102.00    Types: Cigarettes    Start date: 27    Quit date: 03/05/2004    Years since quitting: 16.4  . Smokeless tobacco: Never Used  Vaping Use  . Vaping Use: Never used  Substance and Sexual Activity  . Alcohol use: Yes    Alcohol/week: 7.0 standard drinks    Types: 7 Standard drinks or  equivalent per week    Comment: 1 drink of rum per day  . Drug use: No  . Sexual activity: Not on file  Other Topics Concern  . Not on file  Social History Narrative  . Not on file   Social Determinants of Health   Financial Resource Strain:   . Difficulty of Paying Living Expenses: Not on file  Food Insecurity:   . Worried About Charity fundraiser in the Last Year: Not on file  . Ran Out of Food in the Last Year: Not on file  Transportation Needs:   . Lack of Transportation (Medical): Not on file  . Lack of Transportation (Non-Medical): Not on file  Physical Activity:   .  Days of Exercise per Week: Not on file  . Minutes of Exercise per Session: Not on file  Stress:   . Feeling of Stress : Not on file  Social Connections:   . Frequency of Communication with Friends and Family: Not on file  . Frequency of Social Gatherings with Friends and Family: Not on file  . Attends Religious Services: Not on file  . Active Member of Clubs or Organizations: Not on file  . Attends Archivist Meetings: Not on file  . Marital Status: Not on file  Intimate Partner Violence:   . Fear of Current or Ex-Partner: Not on file  . Emotionally Abused: Not on file  . Physically Abused: Not on file  . Sexually Abused: Not on file    Past Medical History, Surgical history, Social history, and Family history were reviewed and updated as appropriate.   Please see review of systems for further details on the patient's review from today.   Review of Systems:  Review of Systems  Constitutional: Negative for chills, diaphoresis and fever.  HENT: Negative for trouble swallowing and voice change.   Respiratory: Negative for cough, chest tightness, shortness of breath and wheezing.        Right chest pain with deep breathing  Cardiovascular: Positive for chest pain. Negative for palpitations.  Gastrointestinal: Negative for abdominal pain, constipation, diarrhea, nausea and vomiting.    Musculoskeletal: Negative for back pain and myalgias.  Skin: Positive for color change.       Bruise along the right lateral chest wall.  Neurological: Negative for dizziness, light-headedness and headaches.    Objective:   Physical Exam:  There were no vitals taken for this visit. ECOG: 1  Physical Exam Constitutional:      General: He is not in acute distress.    Appearance: Normal appearance. He is not ill-appearing.  HENT:     Head: Normocephalic and atraumatic.  Cardiovascular:     Rate and Rhythm: Normal rate and regular rhythm.     Heart sounds: No murmur heard.  No friction rub. No gallop.   Pulmonary:     Effort: Pulmonary effort is normal. No respiratory distress.     Breath sounds: Normal breath sounds. No wheezing, rhonchi or rales.  Skin:    Findings: Bruising present.     Comments: Significant bruising along the right lateral chest wall consistent with the patient's history of blunt force trauma.  Neurological:     Mental Status: He is alert.  Psychiatric:        Mood and Affect: Mood normal.        Behavior: Behavior normal.        Thought Content: Thought content normal.        Judgment: Judgment normal.     Lab Review:     Component Value Date/Time   NA 136 08/29/2020 1106   NA 142 03/30/2015 1149   K 3.5 08/29/2020 1106   K 4.5 03/30/2015 1149   CL 100 08/29/2020 1106   CO2 30 08/29/2020 1106   CO2 25 03/30/2015 1149   GLUCOSE 100 (H) 08/29/2020 1106   GLUCOSE 104 03/30/2015 1149   BUN 20 08/29/2020 1106   BUN 17.1 03/30/2015 1149   CREATININE 1.28 (H) 08/29/2020 1106   CREATININE 1.2 03/30/2015 1149   CALCIUM 8.8 (L) 08/29/2020 1106   CALCIUM 8.7 03/30/2015 1149   PROT 6.2 (L) 08/29/2020 1106   ALBUMIN 3.6 08/29/2020 1106   AST 10 (L) 08/29/2020  1106   ALT 8 08/29/2020 1106   ALKPHOS 69 08/29/2020 1106   BILITOT 1.1 08/29/2020 1106   GFRNONAA 56 (L) 08/29/2020 1106   GFRAA 52 (L) 08/06/2020 0916       Component Value Date/Time    WBC 0.8 (LL) 08/29/2020 1106   WBC 19.5 (H) 08/06/2020 0916   RBC 3.08 (L) 08/29/2020 1106   HGB 9.1 (L) 08/29/2020 1106   HCT 27.8 (L) 08/29/2020 1106   PLT 27 (L) 08/29/2020 1106   MCV 90.3 08/29/2020 1106   MCH 29.5 08/29/2020 1106   MCHC 32.7 08/29/2020 1106   RDW 18.2 (H) 08/29/2020 1106   LYMPHSABS 0.7 08/29/2020 1106   MONOABS 0.1 08/29/2020 1106   EOSABS 0.0 08/29/2020 1106   BASOSABS 0.0 08/29/2020 1106   -------------------------------  Imaging from last 24 hours (if applicable):  Radiology interpretation: DG Ribs Unilateral W/Chest Right  Result Date: 08/25/2020 CLINICAL DATA:  Pain following fall EXAM: RIGHT RIBS AND CHEST - 3+ VIEW COMPARISON:  Chest radiograph August 04, 2020 FINDINGS: Frontal chest as well as oblique and cone-down rib images were obtained. There are displaced fractures of the anterior and posterior aspects of the right tenth rib. There is a small right pleural effusion but no pneumothorax. There is bibasilar atelectasis. Heart size and pulmonary vascularity are normal. Central catheter tip is in the superior vena cava. There is aortic atherosclerosis. IMPRESSION: Mildly displaced fractures of the anterior and posterior aspects of the right tenth rib. Small right pleural effusion but no pneumothorax. Bibasilar atelectasis. Heart size normal. Aortic Atherosclerosis (ICD10-I70.0). Insert PRA call report Electronically Signed   By: Lowella Grip III M.D.   On: 08/25/2020 14:43   US RENAL  Result Date: 08/05/2020 CLINICAL DATA:  Acute kidney injury EXAM: RENAL / URINARY TRACT ULTRASOUND COMPLETE COMPARISON:  None. FINDINGS: Right Kidney: Renal measurements: 11.1 x 5.6 x 3.4 cm = volume: 108 mL. Mild diffuse parenchymal atrophy with increased renal sinus fat. Normal parenchymal echotexture. No hydronephrosis or solid mass. Left Kidney: Renal measurements: 12.1 x 6.5 x 4.5 cm = volume: 183 mL. Mild diffuse parenchymal atrophy with increased renal sinus fat.  Normal parenchymal echotexture. No hydronephrosis or solid mass. Bladder: No wall thickening or filling defect. Prevoid volume is 186 mL. No postvoid residual. Urine flow jets are demonstrated on color flow Doppler imaging. Prostate gland measures 3.7 x 2.8 x 3.7 cm for a volume of 20 mL. Other: Incidental note of splenic enlargement with spleen measuring 16.6 x 8.8 x 10.7 cm for a volume of 816 mL. No focal lesions are identified. IMPRESSION: 1. Mild diffuse renal parenchymal atrophy. No hydronephrosis. 2. Incidental note of splenic enlargement. Electronically Signed   By: Lucienne Capers M.D.   On: 08/05/2020 01:05   DG Chest Port 1 View  Result Date: 08/04/2020 CLINICAL DATA:  Shortness of breath EXAM: PORTABLE CHEST 1 VIEW COMPARISON:  July 02, 2019 FINDINGS: There is ill-defined airspace opacity in each lung base. Lungs otherwise are clear. Heart is upper normal in size with pulmonary vascularity normal. No adenopathy. There is aortic atherosclerosis. No bone lesions. IMPRESSION: Ill-defined airspace opacity in the lung bases. This appearance is consistent with patchy bibasilar pneumonia. Lungs elsewhere clear. Note that atypical organism pneumonia may present in this manner. In this regard, advise check of COVID-19 status. Stable cardiac silhouette.  No adenopathy. Aortic Atherosclerosis (ICD10-I70.0). Electronically Signed   By: Lowella Grip III M.D.   On: 08/04/2020 16:30   DG Bone Survey Met  Result Date: 08/05/2020 CLINICAL DATA:  Severe back pain bilateral hip pain. Hematologic malignancy. History of carcinoma and situ of vocal cord. Thyroid cancer. Anemia. EXAM: METASTATIC BONE SURVEY COMPARISON:  Multiple priors FINDINGS: No lytic or sclerotic bone lesion. Negative for fracture. No acute skeletal abnormality. Mild degenerative changes in the cervical, thoracic, and lumbar spine. Both hip joints appear normal. Atherosclerotic disease.  Bilateral iliac artery stents. Mild cardiac  enlargement without heart failure. Atherosclerotic aortic arch. Mild right lower lobe airspace disease likely atelectasis. No pleural effusion. IMPRESSION: No acute skeletal abnormality. No evidence of skeletal malignancy or fracture. Mild degenerative changes in the spine. Atherosclerotic disease. Cardiac enlargement without heart failure. Mild right lower lobe airspace disease likely atelectasis. This was not present on the prior study of 07/14/2015. Electronically Signed   By: Franchot Gallo M.D.   On: 08/05/2020 11:05

## 2020-08-30 ENCOUNTER — Telehealth: Payer: Self-pay | Admitting: Nurse Practitioner

## 2020-08-30 ENCOUNTER — Telehealth: Payer: Self-pay | Admitting: *Deleted

## 2020-08-30 NOTE — Telephone Encounter (Signed)
Called to report they were incorrect in follow up date at [facility]. They go there on 09/01/20 (Thurs). Asking if his 11/2 appointments need to moved back to Monday (11/1) as was requested by [facility]?

## 2020-08-30 NOTE — Telephone Encounter (Signed)
Scheduled appointment per 10/25 los. Spoke to patient's wife who just wanted to make sure appointment actually needs to be on Tuesday and not Monday. Spoke to [place] who will follow up with me and let me know for sure. She said to keep appointment as they are for now.

## 2020-08-30 NOTE — Telephone Encounter (Signed)
Noted that appointments were moved to 09/05/20 per scheduler note

## 2020-08-31 ENCOUNTER — Ambulatory Visit (INDEPENDENT_AMBULATORY_CARE_PROVIDER_SITE_OTHER): Payer: Medicare Other | Admitting: Family Medicine

## 2020-08-31 ENCOUNTER — Other Ambulatory Visit: Payer: Self-pay

## 2020-08-31 ENCOUNTER — Encounter: Payer: Self-pay | Admitting: Family Medicine

## 2020-08-31 VITALS — BP 137/60 | HR 82 | Temp 96.9°F | Ht 70.0 in | Wt 200.2 lb

## 2020-08-31 DIAGNOSIS — C9200 Acute myeloblastic leukemia, not having achieved remission: Secondary | ICD-10-CM | POA: Diagnosis not present

## 2020-08-31 DIAGNOSIS — R42 Dizziness and giddiness: Secondary | ICD-10-CM

## 2020-08-31 NOTE — Patient Instructions (Addendum)
Stool softener 2x/day Add miralax 1 capful in 4-6oz of water daily to twice per day

## 2020-08-31 NOTE — Progress Notes (Signed)
Victor [name] is a 84 y.o. male  Chief Complaint  Patient presents with  . Hospitalization Follow-up    2 weeks ago.  Pt said that he is just here to follow up.    HPI: Victor [name] is a 84 y.o. male accompanied today by his wife and seen in f/u from hospital admission and newly diagnosed AML. He was sent to [facility] on 08/04/20 from outpt sports med appt d/t pallor, fatigue and was then transferred to [facility] on 10/2 after pt was found to have likely hematologic malignancy. Pt was found to have AML and treatment was initiated. Pt was discharged on 08/15/20. He is following with oncology at [facility] but is also established at [facility].  He tripped and fell about 1 week ago and hit his Rt side on the counter. He has 2 fractured ribs. Pt endorses pain, bruising. No increased SOB or DOE.  Sleep is at baseline. He is sleeping in recliner since his fall bc that is most comfortable. Appetite is good,  No fever, chills, n/v.  He gets lightheaded at times and feels unsteady on his feet. He is using a walker which helps. He requests Rx for rollator walker as well as shower chair.   Past Medical History:  Diagnosis Date  . Arthritis   . Atherosclerosis   . Atherosclerotic PVD with intermittent claudication (HCC)    stent left leg  . BPH (benign prostatic hyperplasia)   . Bulging lumbar disc   . Cancer ([place]) 11/12/14   vocal cord  carcinoma in situ , radiation; thyroid cancer  . Carotid bruit   . Chronic kidney disease    chronic stage III  . COPD (chronic obstructive pulmonary disease) ([place])   . DDD (degenerative disc disease), lumbar   . Dysphonia   . Dysplasia of true vocal cord   . GERD (gastroesophageal reflux disease)   . H/O carotid atherosclerosis    b/l  . Hyperlipidemia   . Hypothyroidism   . Occasional tremors    left hand managed with propranolol  . Peripheral vascular angioplasty status with implants and grafts   . Precancerous lesion 03/05/2018   premelanoma removed from back.   .  Radiation 03/10/15- 04/18/16   Larynx  . Sleep apnea    hasn't used CPAP in years  . Spondylosis of lumbosacral region   . Thyroid disease     Past Surgical History:  Procedure Laterality Date  . APPENDECTOMY    . COLONOSCOPY W/ POLYPECTOMY    . DIODE LASER APPLICATION Left 16/08/9603   Procedure: DIODE LASER APPLICATION;  Surgeon: Hayden Pedro, MD;  Location: [place];  Service: Ophthalmology;  Laterality: Left;  . MEMBRANE PEEL Left 08/18/2019   Procedure: MEMBRANE PEEL;  Surgeon: Hayden Pedro, MD;  Location: [place];  Service: Ophthalmology;  Laterality: Left;  . MOUTH SURGERY     tooth extraction  . PARS PLANA VITRECTOMY Left 08/18/2019   PARS PLANA VITRECTOMY 27 GAUGE (Left)  . PARS PLANA VITRECTOMY 27 GAUGE Left 08/18/2019   Procedure: PARS PLANA VITRECTOMY 27 GAUGE;  Surgeon: Hayden Pedro, MD;  Location: [place];  Service: Ophthalmology;  Laterality: Left;  . PHOTOCOAGULATION WITH LASER Left 08/18/2019   Procedure: PHOTOCOAGULATION WITH LASER;  Surgeon: Hayden Pedro, MD;  Location: [place];  Service: Ophthalmology;  Laterality: Left;  . THYROID SURGERY    . TONSILLECTOMY    . vocal cord biopsy  11/12/14   squamous cell carcinoma in situ  Social History   Socioeconomic History  . Marital status: Married    Spouse name: Not on file  . Number of children: Not on file  . Years of education: Not on file  . Highest education level: Not on file  Occupational History  . Not on file  Tobacco Use  . Smoking status: Former Smoker    Packs/day: 2.00    Years: 51.00    Pack years: 102.00    Types: Cigarettes    Start date: 46    Quit date: 03/05/2004    Years since quitting: 16.5  . Smokeless tobacco: Never Used  Vaping Use  . Vaping Use: Never used  Substance and Sexual Activity  . Alcohol use: Yes    Alcohol/week: 7.0 standard drinks    Types: 7 Standard drinks or equivalent per week    Comment: 1 drink of rum per day  . Drug use: No  . Sexual activity: Not  on file  Other Topics Concern  . Not on file  Social History Narrative  . Not on file   Social Determinants of Health   Financial Resource Strain:   . Difficulty of Paying Living Expenses: Not on file  Food Insecurity:   . Worried About Charity fundraiser in the Last Year: Not on file  . Ran Out of Food in the Last Year: Not on file  Transportation Needs:   . Lack of Transportation (Medical): Not on file  . Lack of Transportation (Non-Medical): Not on file  Physical Activity:   . Days of Exercise per Week: Not on file  . Minutes of Exercise per Session: Not on file  Stress:   . Feeling of Stress : Not on file  Social Connections:   . Frequency of Communication with Friends and Family: Not on file  . Frequency of Social Gatherings with Friends and Family: Not on file  . Attends Religious Services: Not on file  . Active Member of Clubs or Organizations: Not on file  . Attends Archivist Meetings: Not on file  . Marital Status: Not on file  Intimate Partner Violence:   . Fear of Current or Ex-Partner: Not on file  . Emotionally Abused: Not on file  . Physically Abused: Not on file  . Sexually Abused: Not on file    Family History  Problem Relation Age of Onset  . Colon cancer Father   . Cancer Sister        Died of metastatic cancer  . Leukemia Neg Hx      Immunization History  Administered Date(s) Administered  . Influenza, High Dose Seasonal PF 07/06/2013, 08/14/2018, 08/10/2019, 08/15/2020  . Influenza,inj,Quad PF,6+ Mos 08/10/2015, 07/03/2016  . Influenza,inj,quad, With Preservative 08/09/2014  . Influenza-Unspecified 08/09/2014, 08/10/2015, 07/03/2016, 08/12/2017  . PFIZER SARS-COV-2 Vaccination 12/13/2019, 01/07/2020  . Pneumococcal Conjugate-13 09/20/2014  . Pneumococcal Polysaccharide-23 08/05/2013  . Td 10/11/2017  . Zoster 07/06/2013  . Zoster Recombinat (Shingrix) 09/16/2017    Outpatient Encounter Medications as of 08/31/2020  Medication  Sig  . acetaminophen (TYLENOL) 500 MG tablet Take 1,000 mg by mouth every 6 (six) hours as needed.  Marland Kitchen acyclovir (ZOVIRAX) 400 MG tablet Take 400 mg by mouth 2 (two) times daily as needed.   Marland Kitchen albuterol (VENTOLIN HFA) 108 (90 Base) MCG/ACT inhaler Inhale 1-2 puffs into the lungs every 6 (six) hours as needed for wheezing or shortness of breath.  . doxazosin (CARDURA) 2 MG tablet Take 1 tablet (2 mg total) by mouth  at bedtime.  . fluconazole (DIFLUCAN) 200 MG tablet Take 200 mg by mouth daily as needed.   . fluticasone (FLONASE) 50 MCG/ACT nasal spray Place 2 sprays into the nose 2 (two) times daily as needed.  . furosemide (LASIX) 40 MG tablet Take 20 mg by mouth daily.  Marland Kitchen HEMP OIL-VANILLYL BUTYL ETHER EX Apply topically daily.  Marland Kitchen HYDROcodone-acetaminophen (NORCO) 5-325 MG tablet 1/2 to 1 tablet PO Q 6 hours prn pain  . ibuprofen (ADVIL) 200 MG tablet Take 400 mg by mouth every 6 (six) hours as needed.  Marland Kitchen levofloxacin (LEVAQUIN) 500 MG tablet Take 500 mg by mouth daily.  Marland Kitchen levothyroxine (SYNTHROID) 175 MCG tablet Take 175 mcg by mouth daily.  . Loratadine (CLARITIN PO) Take 10 mg by mouth at bedtime. Allertec  . Melatonin 5 MG CHEW Chew 1 tablet by mouth daily as needed.   Marland Kitchen omeprazole (PRILOSEC) 20 MG capsule Take 1 capsule (20 mg total) by mouth 2 (two) times daily before a meal.  . propranolol ER (INDERAL LA) 80 MG 24 hr capsule Take 1 capsule (80 mg total) by mouth daily.  . simvastatin (ZOCOR) 40 MG tablet Take 1 tablet (40 mg total) by mouth at bedtime.  Marland Kitchen venetoclax 100 MG TABS Take 200 mg by mouth daily.    No facility-administered encounter medications on file as of 08/31/2020.     ROS: Pertinent positives and negatives noted in HPI. Remainder of ROS non-contributory   No Known Allergies  BP 137/60 (BP Location: Left Arm, Patient Position: Sitting, Cuff Size: Normal)   Pulse 82   Temp (!) 96.9 F (36.1 C) (Temporal)   Ht 5\' 10"  (1.778 m)   Wt 200 lb 3.2 oz (90.8 kg)   SpO2  95%   BMI 28.73 kg/m   Physical Exam Constitutional:      General: He is not in acute distress.    Appearance: He is not diaphoretic.  Cardiovascular:     Rate and Rhythm: Normal rate and regular rhythm.     Pulses: Normal pulses.  Pulmonary:     Effort: No respiratory distress.     Breath sounds: Normal breath sounds. No wheezing, rhonchi or rales.  Chest:     Chest wall: Tenderness (Rt lateral chest with significant ecchymosis and TTP) present.  Musculoskeletal:     Right lower leg: No edema.     Left lower leg: No edema.  Neurological:     Mental Status: He is alert and oriented to person, place, and time.  Psychiatric:        Mood and Affect: Mood normal.        Behavior: Behavior normal.      A/P:  1. Acute myeloid leukemia not having achieved remission ([place]) 2. Dizziness - currently receiving treatment and following with oncology at [facility]. He has also established care at [facility] for labs, etc - appetite is good and sleep is at pts baseline Rx: - For home use only DME 4 wheeled rolling walker with seat ([id number]) - For home use only DME Other see comment - pt does not feel home health referral is necessary  This visit occurred during the SARS-CoV-2 public health emergency.  Safety protocols were in place, including screening questions prior to the visit, additional usage of staff PPE, and extensive cleaning of exam room while observing appropriate contact time as indicated for disinfecting solutions.

## 2020-09-04 NOTE — Progress Notes (Signed)
Patient seen by [name], NP.

## 2020-09-05 ENCOUNTER — Inpatient Hospital Stay: Payer: Medicare Other

## 2020-09-05 ENCOUNTER — Other Ambulatory Visit: Payer: Self-pay | Admitting: Nurse Practitioner

## 2020-09-05 ENCOUNTER — Other Ambulatory Visit: Payer: Self-pay

## 2020-09-05 ENCOUNTER — Inpatient Hospital Stay (HOSPITAL_BASED_OUTPATIENT_CLINIC_OR_DEPARTMENT_OTHER): Payer: Medicare Other | Admitting: Nurse Practitioner

## 2020-09-05 ENCOUNTER — Telehealth: Payer: Self-pay | Admitting: Nurse Practitioner

## 2020-09-05 ENCOUNTER — Inpatient Hospital Stay: Payer: Medicare Other | Attending: Oncology

## 2020-09-05 ENCOUNTER — Encounter: Payer: Self-pay | Admitting: Nurse Practitioner

## 2020-09-05 VITALS — BP 135/58 | HR 79 | Temp 97.8°F | Resp 16 | Ht 70.0 in | Wt 198.6 lb

## 2020-09-05 VITALS — BP 128/69 | HR 73 | Temp 98.6°F | Resp 18 | Ht 70.0 in | Wt 198.6 lb

## 2020-09-05 DIAGNOSIS — T451X5A Adverse effect of antineoplastic and immunosuppressive drugs, initial encounter: Secondary | ICD-10-CM | POA: Diagnosis not present

## 2020-09-05 DIAGNOSIS — R509 Fever, unspecified: Secondary | ICD-10-CM | POA: Insufficient documentation

## 2020-09-05 DIAGNOSIS — D709 Neutropenia, unspecified: Secondary | ICD-10-CM | POA: Diagnosis not present

## 2020-09-05 DIAGNOSIS — C9500 Acute leukemia of unspecified cell type not having achieved remission: Secondary | ICD-10-CM

## 2020-09-05 DIAGNOSIS — Z452 Encounter for adjustment and management of vascular access device: Secondary | ICD-10-CM

## 2020-09-05 DIAGNOSIS — Z7189 Other specified counseling: Secondary | ICD-10-CM

## 2020-09-05 DIAGNOSIS — N179 Acute kidney failure, unspecified: Secondary | ICD-10-CM | POA: Diagnosis not present

## 2020-09-05 DIAGNOSIS — Z5111 Encounter for antineoplastic chemotherapy: Secondary | ICD-10-CM | POA: Diagnosis present

## 2020-09-05 DIAGNOSIS — E8770 Fluid overload, unspecified: Secondary | ICD-10-CM | POA: Diagnosis not present

## 2020-09-05 DIAGNOSIS — C9200 Acute myeloblastic leukemia, not having achieved remission: Secondary | ICD-10-CM | POA: Diagnosis not present

## 2020-09-05 DIAGNOSIS — Z79899 Other long term (current) drug therapy: Secondary | ICD-10-CM | POA: Diagnosis not present

## 2020-09-05 DIAGNOSIS — J029 Acute pharyngitis, unspecified: Secondary | ICD-10-CM | POA: Insufficient documentation

## 2020-09-05 DIAGNOSIS — R58 Hemorrhage, not elsewhere classified: Secondary | ICD-10-CM | POA: Insufficient documentation

## 2020-09-05 DIAGNOSIS — J9690 Respiratory failure, unspecified, unspecified whether with hypoxia or hypercapnia: Secondary | ICD-10-CM | POA: Insufficient documentation

## 2020-09-05 DIAGNOSIS — E890 Postprocedural hypothyroidism: Secondary | ICD-10-CM | POA: Diagnosis not present

## 2020-09-05 DIAGNOSIS — D61810 Antineoplastic chemotherapy induced pancytopenia: Secondary | ICD-10-CM | POA: Insufficient documentation

## 2020-09-05 LAB — CBC WITH DIFFERENTIAL (CANCER CENTER ONLY)
Abs Immature Granulocytes: 0 10*3/uL (ref 0.00–0.07)
Basophils Absolute: 0 10*3/uL (ref 0.0–0.1)
Basophils Relative: 0 %
Eosinophils Absolute: 0 10*3/uL (ref 0.0–0.5)
Eosinophils Relative: 0 %
HCT: 25.3 % — ABNORMAL LOW (ref 39.0–52.0)
Hemoglobin: 8 g/dL — ABNORMAL LOW (ref 13.0–17.0)
Immature Granulocytes: 0 %
Lymphocytes Relative: 78 %
Lymphs Abs: 0.5 10*3/uL — ABNORMAL LOW (ref 0.7–4.0)
MCH: 29.3 pg (ref 26.0–34.0)
MCHC: 31.6 g/dL (ref 30.0–36.0)
MCV: 92.7 fL (ref 80.0–100.0)
Monocytes Absolute: 0.1 10*3/uL (ref 0.1–1.0)
Monocytes Relative: 15 %
Neutro Abs: 0 10*3/uL — CL (ref 1.7–7.7)
Neutrophils Relative %: 7 %
Platelet Count: 104 10*3/uL — ABNORMAL LOW (ref 150–400)
RBC: 2.73 MIL/uL — ABNORMAL LOW (ref 4.22–5.81)
RDW: 19.5 % — ABNORMAL HIGH (ref 11.5–15.5)
WBC Count: 0.6 10*3/uL — CL (ref 4.0–10.5)
nRBC: 0 % (ref 0.0–0.2)

## 2020-09-05 LAB — CMP (CANCER CENTER ONLY)
ALT: <6 U/L (ref 0–44)
AST: 8 U/L — ABNORMAL LOW (ref 15–41)
Albumin: 3.2 g/dL — ABNORMAL LOW (ref 3.5–5.0)
Alkaline Phosphatase: 70 U/L (ref 38–126)
Anion gap: 5 (ref 5–15)
BUN: 13 mg/dL (ref 8–23)
CO2: 28 mmol/L (ref 22–32)
Calcium: 8.4 mg/dL — ABNORMAL LOW (ref 8.9–10.3)
Chloride: 105 mmol/L (ref 98–111)
Creatinine: 0.98 mg/dL (ref 0.61–1.24)
GFR, Estimated: >60 mL/min (ref >60)
Glucose, Bld: 95 mg/dL (ref 70–99)
Potassium: 3.5 mmol/L (ref 3.5–5.1)
Sodium: 138 mmol/L (ref 135–145)
Total Bilirubin: 1 mg/dL (ref 0.3–1.2)
Total Protein: 5.5 g/dL — ABNORMAL LOW (ref 6.5–8.1)

## 2020-09-05 LAB — SAMPLE TO BLOOD BANK

## 2020-09-05 LAB — MAGNESIUM: Magnesium: 1.8 mg/dL (ref 1.7–2.4)

## 2020-09-05 LAB — PREPARE RBC (CROSSMATCH)

## 2020-09-05 MED ORDER — SODIUM CHLORIDE 0.9% FLUSH
10.0000 mL | Freq: Once | INTRAVENOUS | Status: AC
  Administered 2020-09-05: 10 mL
  Filled 2020-09-05: qty 10

## 2020-09-05 MED ORDER — HEPARIN SOD (PORK) LOCK FLUSH 100 UNIT/ML IV SOLN
250.0000 [IU] | Freq: Once | INTRAVENOUS | Status: AC
  Administered 2020-09-05: 250 [IU]
  Filled 2020-09-05: qty 5

## 2020-09-05 MED ORDER — SODIUM CHLORIDE 0.9% IV SOLUTION
250.0000 mL | Freq: Once | INTRAVENOUS | Status: AC
  Administered 2020-09-05: 250 mL via INTRAVENOUS
  Filled 2020-09-05: qty 250

## 2020-09-05 NOTE — Progress Notes (Addendum)
[place] OFFICE PROGRESS NOTE   Diagnosis: AML  INTERVAL HISTORY:   Mr. Uttech returns as scheduled.  He continues to have pain at the right posterior ribs.  He continues to note a bruise.  He denies new bruising.  No bleeding.  No fever.  No shaking chills.  He has had a "sore throat" for the past several days.  No nausea or vomiting.  No diarrhea.  He describes his appetite as "decent".  Objective:  Vital signs in last 24 hours:  Blood pressure (!) 135/58, pulse 79, temperature 97.8 F (36.6 C), temperature source Tympanic, resp. rate 16, height $RemoveBe'5\' 10"'trgFtnyLD$  (1.778 m), weight 198 lb 9.6 oz (90.1 kg), SpO2 98 %.    HEENT: No thrush or ulcers.  Posterior pharynx is without erythema or exudate. Resp: Lungs clear bilaterally. Cardio: Regular rate and rhythm. GI: No hepatosplenomegaly.  Vascular: No leg edema.  Skin: Large ecchymosis right mid to low back, some areas of early resolution noted. Right upper extremity PICC without erythema.   Lab Results:  Lab Results  Component Value Date   WBC 0.6 (LL) 09/05/2020   HGB 8.0 (L) 09/05/2020   HCT 25.3 (L) 09/05/2020   MCV 92.7 09/05/2020   PLT 104 (L) 09/05/2020   NEUTROABS 0.0 (LL) 09/05/2020    Imaging:  No results found.  Medications: I have reviewed the patient's current medications.  Assessment/Plan:  1.  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  Pancytopenia secondary to #1 and chemotherapy 3.  Right PICC placed August 08, 2020 4.  TTE August 08, 2020-EF 60-65% 5.  Fever during hospital admission October 2021, suspicion of  pneumonia, treated with cefepime and discharged on Levaquin 6.  Hypervolemia with respiratory failure during hospital admission October 2021-diuresis, home Lasix 7.  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  Acute renal failure October 2021-improved   Disposition: Mr. Ewbank appears stable.  We reviewed the CBC from today.  The platelet count is better, 104,000 today.  Hemoglobin has declined to 8.  He will receive 1 unit of blood today.  He continues to have severe neutropenia and will continue Levaquin, acyclovir, fluconazole.  He understands to contact the office with fever, chills, other signs of infection.  He had a bone marrow biopsy last week at [facility].  Final report is pending.  Treatment decision once the final report is available.  He will return for PICC flush/care on a Monday Wednesday Friday schedule.  He will return for lab and follow-up in 1 week.  Patient seen with Dr. Benay Spice.  [name] ANP/GNP-BC   09/05/2020  12:52 PM  This was a shared visit with [name].  The platelet count has improved, but he has persistent severe neutropenia.  He will continue antibiotic prophylaxis.  He will receive 1 unit of packed red cells today.  I discussed the case with Dr. Linus Orn today.  He indicates the bone marrow biopsy from 09/01/2020 reveals no increase in blast cells.  He recommends a 2-week break to allow for white count recovery and then proceed with cycle 2 of 5 azacytidine and venetoclax.  Venetoclax will also be placed  on hold for the next 2 weeks.  We discussed this recommendation with Mr. Bingaman.  He will return for a lab visit on 09/08/2020.  He will be scheduled for an office visit in 5 azacytidine in 2 weeks.  A chemotherapy plan was entered today.

## 2020-09-05 NOTE — Telephone Encounter (Signed)
I contacted [name] to let him know Dr. Benay Spice has spoken with Dr. Linus Orn.  Recommendations are to continue twice weekly labs, hold venetoclax, return for follow-up appointment in 2 weeks with the plan to resume treatment with venetoclax and Vidaza if counts are adequate.  He should not get the Covid booster at this time.  He expressed his understanding.  Next PICC flush/lab appointment on 09/08/2020.  New message was sent to the scheduling desk with the above updates.

## 2020-09-05 NOTE — Progress Notes (Signed)
START ON PATHWAY REGIMEN - MDS     A cycle is every 28 days:     Azacitidine   **Always confirm dose/schedule in your pharmacy ordering system**  Patient Characteristics: Higher-Risk (IPSS-R Score > 3.5), First Line, Not a Transplant Candidate WHO Disease Classification: MDS-EB2 Bone Marrow Blasts (percent): Risk Score Calculated < IPSS-R Cytogenetic Category: Risk Score Calculated < IPSS-R Platelets (x 10^9/L): Risk Score Calculated < IPSS-R Absolute Neutrophil Count (x 10^9/L): Risk Score Calculated < IPSS-R Line of Therapy: First Line IPSS-R Risk Category: Risk Score Calculated < IPSS-R IPSS-R Risk Score: Risk Score Calculated < IPSS-R Check here if patient's risk score was calculated prior to the International Prognostic Scoring System-Revised (IPSS-R): true Hemoglobin (g/dl): Risk Score Calculated < IPSS-R Patient Characteristics: Not a Transplant Candidate Intent of Therapy: Non-Curative / Palliative Intent, Discussed with Patient

## 2020-09-05 NOTE — Patient Instructions (Signed)

## 2020-09-06 ENCOUNTER — Ambulatory Visit: Payer: Medicare Other | Admitting: Nurse Practitioner

## 2020-09-06 ENCOUNTER — Other Ambulatory Visit: Payer: Medicare Other

## 2020-09-06 ENCOUNTER — Telehealth: Payer: Self-pay | Admitting: Nurse Practitioner

## 2020-09-06 LAB — TYPE AND SCREEN
ABO/RH(D): A POS
Antibody Screen: NEGATIVE
Unit division: 0

## 2020-09-06 LAB — BPAM RBC
Blood Product Expiration Date: 202111212359
ISSUE DATE / TIME: 202111011329
Unit Type and Rh: 6200

## 2020-09-06 NOTE — Telephone Encounter (Signed)
Scheduled appointments per 11/1 sch msg. Spoke to patient's wife who is aware of appointments dates and times. Will have updated calendar printed for patient at next visit.

## 2020-09-07 ENCOUNTER — Other Ambulatory Visit: Payer: Self-pay | Admitting: *Deleted

## 2020-09-07 DIAGNOSIS — C9500 Acute leukemia of unspecified cell type not having achieved remission: Secondary | ICD-10-CM

## 2020-09-08 ENCOUNTER — Inpatient Hospital Stay: Payer: Medicare Other

## 2020-09-08 ENCOUNTER — Other Ambulatory Visit: Payer: Self-pay

## 2020-09-08 ENCOUNTER — Encounter: Payer: Self-pay | Admitting: *Deleted

## 2020-09-08 DIAGNOSIS — C9200 Acute myeloblastic leukemia, not having achieved remission: Secondary | ICD-10-CM | POA: Diagnosis not present

## 2020-09-08 DIAGNOSIS — C9500 Acute leukemia of unspecified cell type not having achieved remission: Secondary | ICD-10-CM

## 2020-09-08 DIAGNOSIS — Z452 Encounter for adjustment and management of vascular access device: Secondary | ICD-10-CM

## 2020-09-08 LAB — CMP (CANCER CENTER ONLY)
ALT: <6 U/L (ref 0–44)
AST: 9 U/L — ABNORMAL LOW (ref 15–41)
Albumin: 3.3 g/dL — ABNORMAL LOW (ref 3.5–5.0)
Alkaline Phosphatase: 86 U/L (ref 38–126)
Anion gap: 7 (ref 5–15)
BUN: 15 mg/dL (ref 8–23)
CO2: 25 mmol/L (ref 22–32)
Calcium: 8.2 mg/dL — ABNORMAL LOW (ref 8.9–10.3)
Chloride: 105 mmol/L (ref 98–111)
Creatinine: 0.97 mg/dL (ref 0.61–1.24)
GFR, Estimated: >60 mL/min (ref >60)
Glucose, Bld: 106 mg/dL — ABNORMAL HIGH (ref 70–99)
Potassium: 3.7 mmol/L (ref 3.5–5.1)
Sodium: 137 mmol/L (ref 135–145)
Total Bilirubin: 1 mg/dL (ref 0.3–1.2)
Total Protein: 5.7 g/dL — ABNORMAL LOW (ref 6.5–8.1)

## 2020-09-08 LAB — CBC WITH DIFFERENTIAL (CANCER CENTER ONLY)
Abs Immature Granulocytes: 0.01 10*3/uL (ref 0.00–0.07)
Basophils Absolute: 0 10*3/uL (ref 0.0–0.1)
Basophils Relative: 1 %
Eosinophils Absolute: 0 10*3/uL (ref 0.0–0.5)
Eosinophils Relative: 0 %
HCT: 28.2 % — ABNORMAL LOW (ref 39.0–52.0)
Hemoglobin: 9.1 g/dL — ABNORMAL LOW (ref 13.0–17.0)
Immature Granulocytes: 1 %
Lymphocytes Relative: 60 %
Lymphs Abs: 0.6 10*3/uL — ABNORMAL LOW (ref 0.7–4.0)
MCH: 29.6 pg (ref 26.0–34.0)
MCHC: 32.3 g/dL (ref 30.0–36.0)
MCV: 91.9 fL (ref 80.0–100.0)
Monocytes Absolute: 0.3 10*3/uL (ref 0.1–1.0)
Monocytes Relative: 33 %
Neutro Abs: 0.1 10*3/uL — CL (ref 1.7–7.7)
Neutrophils Relative %: 5 %
Platelet Count: 117 10*3/uL — ABNORMAL LOW (ref 150–400)
RBC: 3.07 MIL/uL — ABNORMAL LOW (ref 4.22–5.81)
RDW: 19 % — ABNORMAL HIGH (ref 11.5–15.5)
WBC Count: 1 10*3/uL — ABNORMAL LOW (ref 4.0–10.5)
nRBC: 0 % (ref 0.0–0.2)

## 2020-09-08 LAB — MAGNESIUM: Magnesium: 1.8 mg/dL (ref 1.7–2.4)

## 2020-09-08 MED ORDER — SODIUM CHLORIDE 0.9% FLUSH
10.0000 mL | Freq: Once | INTRAVENOUS | Status: AC
  Administered 2020-09-08: 10 mL
  Filled 2020-09-08: qty 10

## 2020-09-08 MED ORDER — HEPARIN SOD (PORK) LOCK FLUSH 100 UNIT/ML IV SOLN
250.0000 [IU] | Freq: Once | INTRAVENOUS | Status: AC
  Administered 2020-09-08: 250 [IU]
  Filled 2020-09-08: qty 5

## 2020-09-08 NOTE — Progress Notes (Signed)
Reviewed today's labs with patient--overall improved. No need for blood or platelets today. F/U as scheduled on 09/12/20 and continue antibiotics and call for fever or s/s of infection. Called critical results to Eilene Ghazi, RN at [facility] and faxed to [phone] as requested. Patient asking for refill on hydrocodone and for an increase in quantity (takes 1 tab now) for his rib pain.

## 2020-09-08 NOTE — Patient Instructions (Signed)

## 2020-09-09 ENCOUNTER — Other Ambulatory Visit: Payer: Self-pay | Admitting: Nurse Practitioner

## 2020-09-09 DIAGNOSIS — R079 Chest pain, unspecified: Secondary | ICD-10-CM

## 2020-09-09 DIAGNOSIS — C9500 Acute leukemia of unspecified cell type not having achieved remission: Secondary | ICD-10-CM

## 2020-09-09 MED ORDER — HYDROCODONE-ACETAMINOPHEN 5-325 MG PO TABS: 1.0000 | ORAL_TABLET | Freq: Four times a day (QID) | ORAL | 0 refills | Status: DC | PRN

## 2020-09-12 ENCOUNTER — Other Ambulatory Visit: Payer: Self-pay

## 2020-09-12 ENCOUNTER — Inpatient Hospital Stay: Payer: Medicare Other

## 2020-09-12 ENCOUNTER — Telehealth: Payer: Self-pay | Admitting: *Deleted

## 2020-09-12 DIAGNOSIS — C9500 Acute leukemia of unspecified cell type not having achieved remission: Secondary | ICD-10-CM

## 2020-09-12 DIAGNOSIS — Z452 Encounter for adjustment and management of vascular access device: Secondary | ICD-10-CM

## 2020-09-12 DIAGNOSIS — C9200 Acute myeloblastic leukemia, not having achieved remission: Secondary | ICD-10-CM | POA: Diagnosis not present

## 2020-09-12 LAB — CBC WITH DIFFERENTIAL (CANCER CENTER ONLY)
Abs Immature Granulocytes: 0.02 10*3/uL (ref 0.00–0.07)
Basophils Absolute: 0 10*3/uL (ref 0.0–0.1)
Basophils Relative: 1 %
Eosinophils Absolute: 0 10*3/uL (ref 0.0–0.5)
Eosinophils Relative: 0 %
HCT: 27.1 % — ABNORMAL LOW (ref 39.0–52.0)
Hemoglobin: 8.8 g/dL — ABNORMAL LOW (ref 13.0–17.0)
Immature Granulocytes: 2 %
Lymphocytes Relative: 51 %
Lymphs Abs: 0.6 10*3/uL — ABNORMAL LOW (ref 0.7–4.0)
MCH: 29.6 pg (ref 26.0–34.0)
MCHC: 32.5 g/dL (ref 30.0–36.0)
MCV: 91.2 fL (ref 80.0–100.0)
Monocytes Absolute: 0.4 10*3/uL (ref 0.1–1.0)
Monocytes Relative: 30 %
Neutro Abs: 0.2 10*3/uL — CL (ref 1.7–7.7)
Neutrophils Relative %: 16 %
Platelet Count: 125 10*3/uL — ABNORMAL LOW (ref 150–400)
RBC: 2.97 MIL/uL — ABNORMAL LOW (ref 4.22–5.81)
RDW: 19.3 % — ABNORMAL HIGH (ref 11.5–15.5)
WBC Count: 1.2 10*3/uL — ABNORMAL LOW (ref 4.0–10.5)
nRBC: 0 % (ref 0.0–0.2)

## 2020-09-12 LAB — CMP (CANCER CENTER ONLY)
ALT: <6 U/L (ref 0–44)
AST: 8 U/L — ABNORMAL LOW (ref 15–41)
Albumin: 3.1 g/dL — ABNORMAL LOW (ref 3.5–5.0)
Alkaline Phosphatase: 85 U/L (ref 38–126)
Anion gap: 7 (ref 5–15)
BUN: 14 mg/dL (ref 8–23)
CO2: 26 mmol/L (ref 22–32)
Calcium: 8.3 mg/dL — ABNORMAL LOW (ref 8.9–10.3)
Chloride: 105 mmol/L (ref 98–111)
Creatinine: 0.94 mg/dL (ref 0.61–1.24)
GFR, Estimated: >60 mL/min (ref >60)
Glucose, Bld: 96 mg/dL (ref 70–99)
Potassium: 3.7 mmol/L (ref 3.5–5.1)
Sodium: 138 mmol/L (ref 135–145)
Total Bilirubin: 1.1 mg/dL (ref 0.3–1.2)
Total Protein: 5.9 g/dL — ABNORMAL LOW (ref 6.5–8.1)

## 2020-09-12 LAB — SAMPLE TO BLOOD BANK

## 2020-09-12 LAB — MAGNESIUM: Magnesium: 2 mg/dL (ref 1.7–2.4)

## 2020-09-12 MED ORDER — HEPARIN SOD (PORK) LOCK FLUSH 100 UNIT/ML IV SOLN
250.0000 [IU] | Freq: Once | INTRAVENOUS | Status: AC
  Administered 2020-09-12: 250 [IU]
  Filled 2020-09-12: qty 5

## 2020-09-12 MED ORDER — SODIUM CHLORIDE 0.9% FLUSH
10.0000 mL | Freq: Once | INTRAVENOUS | Status: AC
  Administered 2020-09-12: 10 mL
  Filled 2020-09-12: qty 10

## 2020-09-12 NOTE — Telephone Encounter (Signed)
Reviewed lab results w/patient and to continue antibiotics and call for fever or infection. Faxed results to [facility] #[phone] and called critical results to Jackelyn Poling, RN at [facility] [phone].

## 2020-09-13 NOTE — Progress Notes (Signed)
Pharmacist Chemotherapy Monitoring - Initial Assessment    Anticipated start date: 09/19/20  Regimen:  . Are orders appropriate based on the patient's diagnosis, regimen, and cycle? Yes . Does the plan date match the patient's scheduled date? Yes . Is the sequencing of drugs appropriate? Yes . Are the premedications appropriate for the patient's regimen? Yes . Prior Authorization for treatment is: Approved o If applicable, is the correct biosimilar selected based on the patient's insurance? not applicable  Organ Function and Labs: Marland Kitchen Are dose adjustments needed based on the patient's renal function, hepatic function, or hematologic function? Yes . Are appropriate labs ordered prior to the start of patient's treatment? Yes . Other organ system assessment, if indicated: N/A . The following baseline labs, if indicated, have been ordered: N/A  Dose Assessment: . Are the drug doses appropriate? Yes . Are the following correct: o Drug concentrations Yes o IV fluid compatible with drug Yes o Administration routes Yes o Timing of therapy Yes . If applicable, does the patient have documented access for treatment and/or plans for port-a-cath placement? yes . If applicable, have lifetime cumulative doses been properly documented and assessed? not applicable Lifetime Dose Tracking  No doses have been documented on this patient for the following tracked chemicals: Doxorubicin, Epirubicin, Idarubicin, Daunorubicin, Mitoxantrone, Bleomycin, Oxaliplatin, Carboplatin, Liposomal Doxorubicin  o   Toxicity Monitoring/Prevention: . The patient has the following take home antiemetics prescribed: N/A . The patient has the following take home medications prescribed: N/A and VZV prophylaxis . Medication allergies and previous infusion related reactions, if applicable, have been reviewed and addressed. Yes . The patient's current medication list has been assessed for drug-drug interactions with their  chemotherapy regimen. no significant drug-drug interactions were identified on review.  Order Review: . Are the treatment plan orders signed? No . Is the patient scheduled to see a provider prior to their treatment? Yes  I verify that I have reviewed each item in the above checklist and answered each question accordingly.  Philomena Course 09/13/2020 11:48 AM

## 2020-09-15 ENCOUNTER — Telehealth: Payer: Self-pay | Admitting: *Deleted

## 2020-09-15 ENCOUNTER — Inpatient Hospital Stay: Payer: Medicare Other

## 2020-09-15 ENCOUNTER — Other Ambulatory Visit: Payer: Self-pay

## 2020-09-15 DIAGNOSIS — C9200 Acute myeloblastic leukemia, not having achieved remission: Secondary | ICD-10-CM | POA: Diagnosis not present

## 2020-09-15 DIAGNOSIS — C9500 Acute leukemia of unspecified cell type not having achieved remission: Secondary | ICD-10-CM

## 2020-09-15 LAB — CBC WITH DIFFERENTIAL (CANCER CENTER ONLY)
Abs Immature Granulocytes: 0.11 10*3/uL — ABNORMAL HIGH (ref 0.00–0.07)
Basophils Absolute: 0 10*3/uL (ref 0.0–0.1)
Basophils Relative: 1 %
Eosinophils Absolute: 0 10*3/uL (ref 0.0–0.5)
Eosinophils Relative: 0 %
HCT: 30.6 % — ABNORMAL LOW (ref 39.0–52.0)
Hemoglobin: 9.9 g/dL — ABNORMAL LOW (ref 13.0–17.0)
Immature Granulocytes: 5 %
Lymphocytes Relative: 41 %
Lymphs Abs: 0.9 10*3/uL (ref 0.7–4.0)
MCH: 30.1 pg (ref 26.0–34.0)
MCHC: 32.4 g/dL (ref 30.0–36.0)
MCV: 93 fL (ref 80.0–100.0)
Monocytes Absolute: 0.4 10*3/uL (ref 0.1–1.0)
Monocytes Relative: 19 %
Neutro Abs: 0.7 10*3/uL — ABNORMAL LOW (ref 1.7–7.7)
Neutrophils Relative %: 34 %
Platelet Count: 127 10*3/uL — ABNORMAL LOW (ref 150–400)
RBC: 3.29 MIL/uL — ABNORMAL LOW (ref 4.22–5.81)
RDW: 20.1 % — ABNORMAL HIGH (ref 11.5–15.5)
WBC Count: 2.1 10*3/uL — ABNORMAL LOW (ref 4.0–10.5)
nRBC: 0 % (ref 0.0–0.2)

## 2020-09-15 NOTE — Telephone Encounter (Signed)
CBC results improved and patient notified. Faxed results to [facility] and called critical results to [place] at [facility]. Noted he is still on his antibiotics.

## 2020-09-15 NOTE — Progress Notes (Signed)
Patient will have labs drawn by the lab today due to supply issues.

## 2020-09-18 ENCOUNTER — Other Ambulatory Visit: Payer: Self-pay | Admitting: Oncology

## 2020-09-19 ENCOUNTER — Inpatient Hospital Stay: Payer: Medicare Other

## 2020-09-19 ENCOUNTER — Encounter: Payer: Self-pay | Admitting: Nurse Practitioner

## 2020-09-19 ENCOUNTER — Other Ambulatory Visit: Payer: Self-pay

## 2020-09-19 ENCOUNTER — Inpatient Hospital Stay (HOSPITAL_BASED_OUTPATIENT_CLINIC_OR_DEPARTMENT_OTHER): Payer: Medicare Other | Admitting: Nurse Practitioner

## 2020-09-19 VITALS — BP 106/56 | HR 86 | Temp 98.1°F | Resp 20 | Ht 70.0 in | Wt 189.3 lb

## 2020-09-19 DIAGNOSIS — C9200 Acute myeloblastic leukemia, not having achieved remission: Secondary | ICD-10-CM | POA: Diagnosis not present

## 2020-09-19 DIAGNOSIS — C9500 Acute leukemia of unspecified cell type not having achieved remission: Secondary | ICD-10-CM

## 2020-09-19 DIAGNOSIS — Z452 Encounter for adjustment and management of vascular access device: Secondary | ICD-10-CM

## 2020-09-19 LAB — CMP (CANCER CENTER ONLY)
ALT: <6 U/L (ref 0–44)
AST: 10 U/L — ABNORMAL LOW (ref 15–41)
Albumin: 3.2 g/dL — ABNORMAL LOW (ref 3.5–5.0)
Alkaline Phosphatase: 82 U/L (ref 38–126)
Anion gap: 9 (ref 5–15)
BUN: 11 mg/dL (ref 8–23)
CO2: 26 mmol/L (ref 22–32)
Calcium: 8.4 mg/dL — ABNORMAL LOW (ref 8.9–10.3)
Chloride: 106 mmol/L (ref 98–111)
Creatinine: 1.11 mg/dL (ref 0.61–1.24)
GFR, Estimated: >60 mL/min (ref >60)
Glucose, Bld: 111 mg/dL — ABNORMAL HIGH (ref 70–99)
Potassium: 3.6 mmol/L (ref 3.5–5.1)
Sodium: 141 mmol/L (ref 135–145)
Total Bilirubin: 0.8 mg/dL (ref 0.3–1.2)
Total Protein: 6.1 g/dL — ABNORMAL LOW (ref 6.5–8.1)

## 2020-09-19 LAB — CBC WITH DIFFERENTIAL (CANCER CENTER ONLY)
Abs Immature Granulocytes: 0.04 10*3/uL (ref 0.00–0.07)
Basophils Absolute: 0 10*3/uL (ref 0.0–0.1)
Basophils Relative: 1 %
Eosinophils Absolute: 0 10*3/uL (ref 0.0–0.5)
Eosinophils Relative: 0 %
HCT: 30 % — ABNORMAL LOW (ref 39.0–52.0)
Hemoglobin: 9.6 g/dL — ABNORMAL LOW (ref 13.0–17.0)
Immature Granulocytes: 1 %
Lymphocytes Relative: 31 %
Lymphs Abs: 0.9 10*3/uL (ref 0.7–4.0)
MCH: 30 pg (ref 26.0–34.0)
MCHC: 32 g/dL (ref 30.0–36.0)
MCV: 93.8 fL (ref 80.0–100.0)
Monocytes Absolute: 0.3 10*3/uL (ref 0.1–1.0)
Monocytes Relative: 10 %
Neutro Abs: 1.8 10*3/uL (ref 1.7–7.7)
Neutrophils Relative %: 57 %
Platelet Count: 87 10*3/uL — ABNORMAL LOW (ref 150–400)
RBC: 3.2 MIL/uL — ABNORMAL LOW (ref 4.22–5.81)
RDW: 20.9 % — ABNORMAL HIGH (ref 11.5–15.5)
WBC Count: 3 10*3/uL — ABNORMAL LOW (ref 4.0–10.5)
nRBC: 0 % (ref 0.0–0.2)

## 2020-09-19 LAB — SAMPLE TO BLOOD BANK

## 2020-09-19 LAB — MAGNESIUM: Magnesium: 2.1 mg/dL (ref 1.7–2.4)

## 2020-09-19 MED ORDER — SODIUM CHLORIDE 0.9 % IV SOLN
75.0000 mg/m2 | Freq: Once | INTRAVENOUS | Status: AC
  Administered 2020-09-19: 160 mg via INTRAVENOUS
  Filled 2020-09-19: qty 16

## 2020-09-19 MED ORDER — PALONOSETRON HCL INJECTION 0.25 MG/5ML
0.2500 mg | Freq: Once | INTRAVENOUS | Status: AC
  Administered 2020-09-19: 0.25 mg via INTRAVENOUS

## 2020-09-19 MED ORDER — HEPARIN SOD (PORK) LOCK FLUSH 100 UNIT/ML IV SOLN
250.0000 [IU] | Freq: Once | INTRAVENOUS | Status: AC | PRN
  Administered 2020-09-19: 250 [IU]
  Filled 2020-09-19: qty 5

## 2020-09-19 MED ORDER — SODIUM CHLORIDE 0.9% FLUSH
10.0000 mL | Freq: Once | INTRAVENOUS | Status: AC
  Administered 2020-09-19: 10 mL
  Filled 2020-09-19: qty 10

## 2020-09-19 MED ORDER — HEPARIN SOD (PORK) LOCK FLUSH 100 UNIT/ML IV SOLN
250.0000 [IU] | Freq: Once | INTRAVENOUS | Status: AC
  Administered 2020-09-19: 250 [IU]
  Filled 2020-09-19: qty 5

## 2020-09-19 MED ORDER — SODIUM CHLORIDE 0.9 % IV SOLN
Freq: Once | INTRAVENOUS | Status: AC
  Filled 2020-09-19: qty 250

## 2020-09-19 MED ORDER — SODIUM CHLORIDE 0.9% FLUSH
10.0000 mL | INTRAVENOUS | Status: DC | PRN
  Administered 2020-09-19: 3 mL
  Filled 2020-09-19: qty 10

## 2020-09-19 MED ORDER — SODIUM CHLORIDE 0.9 % IV SOLN
10.0000 mg | Freq: Once | INTRAVENOUS | Status: AC
  Administered 2020-09-19: 10 mg via INTRAVENOUS
  Filled 2020-09-19: qty 10

## 2020-09-19 MED ORDER — PALONOSETRON HCL INJECTION 0.25 MG/5ML
INTRAVENOUS | Status: AC
  Filled 2020-09-19: qty 5

## 2020-09-19 NOTE — Progress Notes (Signed)
Spoke w/nurse Rose at [facility] and provided lab results. Per Shelbie Hutching, NP w/Dr. Linus Orn: OK to treat w/Vidaza at full dose (75mg /m2); D/C fluconazole and Levaquin; start Venetoclax 400 mg qd x 21 days.  Faxed today's labs to [facility] : [facility].

## 2020-09-19 NOTE — Patient Instructions (Signed)
PICC Home Care Guide  A peripherally inserted central catheter (PICC) is a form of IV access that allows medicines and IV fluids to be quickly distributed throughout the body. The PICC is a long, thin, flexible tube (catheter) that is inserted into a vein in the upper arm. The catheter ends in a large vein in the chest (superior vena cava, or SVC). After the PICC is inserted, a chest X-ray may be done to make sure that it is in the correct place. A PICC may be placed for different reasons, such as:  To give medicines and liquid nutrition.  To give IV fluids and blood products.  If there is trouble placing a peripheral intravenous (PIV) catheter. If taken care of properly, a PICC can remain in place for several months. Having a PICC can also allow a person to go home from the hospital sooner. Medicine and PICC care can be managed at home by a family member, caregiver, or home health care team. What are the risks? Generally, having a PICC is safe. However, problems may occur, including:  A blood clot (thrombus) forming in or at the tip of the PICC.  A blood clot forming in a vein (deep vein thrombosis) or traveling to the lung (pulmonary embolism).  Inflammation of the vein (phlebitis) in which the PICC is placed.  Infection. Central line associated blood stream infection (CLABSI) is a serious infection that often requires hospitalization.  PICC movement (malposition). The PICC tip may move from its original position due to excessive physical activity, forceful coughing, sneezing, or vomiting.  A break or cut in the PICC. It is important not to use scissors near the PICC.  Nerve or tendon irritation or injury during PICC insertion. How to take care of your PICC Preventing problems  You and any caregivers should wash your hands often with soap. Wash hands: ? Before touching the PICC line or the infusion device. ? Before changing a bandage (dressing).  Flush the PICC as told by your  health care provider. Let your health care provider know right away if the PICC is hard to flush or does not flush. Do not use force to flush the PICC.  Do not use a syringe that is less than 10 mL to flush the PICC.  Avoid blood pressure checks on the arm in which the PICC is placed.  Never pull or tug on the PICC.  Do not take the PICC out yourself. Only a trained clinical professional should remove the PICC.  Use clean and sterile supplies only. Keep the supplies in a dry place. Do not reuse needles, syringes, or any other supplies. Doing that can lead to infection.  Keep pets and children away from your PICC line.  Check the PICC insertion site every day for signs of infection. Check for: ? Leakage. ? Redness, swelling, or pain. ? Fluid or blood. ? Warmth. ? Pus or a bad smell. PICC dressing care  Keep your PICC bandage (dressing) clean and dry to prevent infection.  Do not take baths, swim, or use a hot tub until your health care provider approves. Ask your health care provider if you can take showers. You may only be allowed to take sponge baths for bathing. When you are allowed to shower: ? Ask your health care provider to teach you how to wrap the PICC line. ? Cover the PICC line with clear plastic wrap and tape to keep it dry while showering.  Follow instructions from your health care provider   about how to take care of your insertion site and dressing. Make sure you: ? Wash your hands with soap and water before you change your bandage (dressing). If soap and water are not available, use hand sanitizer. ? Change your dressing as told by your health care provider. ? Leave stitches (sutures), skin glue, or adhesive strips in place. These skin closures may need to stay in place for 2 weeks or longer. If adhesive strip edges start to loosen and curl up, you may trim the loose edges. Do not remove adhesive strips completely unless your health care provider tells you to do  that.  Change your PICC dressing if it becomes loose or wet. General instructions   Carry your PICC identification card or wear a medical alert bracelet at all times.  Keep the tube clamped at all times, unless it is being used.  Carry a smooth-edge clamp with you at all times to place on the tube if it breaks.  Do not use scissors or sharp objects near the tube.  You may bend your arm and move it freely. If your PICC is near or at the bend of your elbow, avoid activity with repeated motion at the elbow.  Avoid lifting heavy objects as told by your health care provider.  Keep all follow-up visits as told by your health care provider. This is important. Disposal of supplies  Throw away any syringes in a disposal container that is meant for sharp items (sharps container). You can buy a sharps container from a pharmacy, or you can make one by using an empty hard plastic bottle with a cover.  Place any used dressings or infusion bags into a plastic bag. Throw that bag in the trash. Contact a health care provider if:  You have pain in your arm, ear, face, or teeth.  You have a fever or chills.  You have redness, swelling, or pain around the insertion site.  You have fluid or blood coming from the insertion site.  Your insertion site feels warm to the touch.  You have pus or a bad smell coming from the insertion site.  Your skin feels hard and raised around the insertion site. Get help right away if:  Your PICC is accidentally pulled all the way out. If this happens, cover the insertion site with a bandage or gauze dressing. Do not throw the PICC away. Your health care provider will need to check it.  Your PICC was tugged or pulled and has partially come out. Do not  push the PICC back in.  You cannot flush the PICC, it is hard to flush, or the PICC leaks around the insertion site when it is flushed.  You hear a "flushing" sound when the PICC is flushed.  You feel your  heart racing or skipping beats.  There is a hole or tear in the PICC.  You have swelling in the arm in which the PICC was inserted.  You have a red streak going up your arm from where the PICC was inserted. Summary  A peripherally inserted central catheter (PICC) is a long, thin, flexible tube (catheter) that is inserted into a vein in the upper arm.  The PICC is inserted using a sterile technique by a specially trained nurse or physician. Only a trained clinical professional should remove it.  Keep your PICC identification card with you at all times.  Avoid blood pressure checks on the arm in which the PICC is placed.  If cared for   properly, a PICC can remain in place for several months. Having a PICC can also allow a person to go home from the hospital sooner. This information is not intended to replace advice given to you by your health care provider. Make sure you discuss any questions you have with your health care provider. Document Revised: 10/04/2017 Document Reviewed: 11/24/2016 Elsevier Patient Education  2020 [employer]  

## 2020-09-19 NOTE — Progress Notes (Signed)
Per Dr. Benay Spice: OK to treat w/ 87,000 platelets

## 2020-09-19 NOTE — Progress Notes (Addendum)
  [name]   Diagnosis: AML  INTERVAL HISTORY:   Victor [name] returns as scheduled.  Denies bleeding.  Bruises seem to be resolving.  He denies fever.  For the past 3 to 4 days he has noted soreness and a "bump" at the mid chest.  Objective:  Vital signs in last 24 hours:  Blood pressure (!) 106/56, pulse 86, temperature 98.1 F (36.7 C), temperature source Tympanic, resp. rate 20, height $RemoveBe'5\' 10"'uDzTtmLiA$  (1.778 m), weight 189 lb 4.8 oz (85.9 kg), SpO2 96 %.    HEENT: No thrush or ulcers. Resp: Lungs clear bilaterally. Cardio: Regular rate and rhythm. GI: No hepatosplenomegaly. Vascular: No leg edema. Neuro: Alert and oriented. Skin: Resolving ecchymosis right mid to low back. Musculoskeletal: Tender over distal sternum/xiphoid process. Right upper extremity PICC without erythema.   Lab Results:  Lab Results  Component Value Date   WBC 3.0 (L) 09/19/2020   HGB 9.6 (L) 09/19/2020   HCT 30.0 (L) 09/19/2020   MCV 93.8 09/19/2020   PLT 87 (L) 09/19/2020   NEUTROABS 1.8 09/19/2020    Imaging:  No results found.  Medications: I have reviewed the patient's current medications.  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 monocytes, FLT3-ITD mutation positive  Cycle 1 azacytidine and venetoclax August 09, 2020; venetoclax dose reduced to 200 mg beginning 08/22/2020  Bone marrow biopsy 09/01/2020-no increase in blast cells  Cycle 2 azacitidine and venetoclax September 19, 2020, venetoclax 400 mg daily 21 days  2.Pancytopenia secondary to #1 and chemotherapy 3.Right PICC placed August 08, 2020 4.TTE August 08, 2020-EF 60-65% 5.Fever during hospital admission October 2021, suspicion of  pneumonia, treated with cefepime and discharged on Levaquin 6.Hypervolemia with respiratory failure during hospital admission October 2021-diuresis, home Lasix 7.Hypothyroidism secondary to thyroidectomy and radiation-thyroid hormone increased October 2021 8.Acute renal failure October 2021-improved   Disposition: Victor [name] appears stable.  We reviewed the CBC from today.  Hemoglobin is stable.  White count continues to improve.  Platelet count slightly lower at 87,000.  Results reviewed with hematology/oncology at [facility] with recommendation to proceed with cycle 2 azacitidine and venetoclax today as scheduled.  Victor [name] agrees with this plan.  He will discontinue Levaquin and Diflucan for now.  Plan to continue twice weekly labs.  We will see him in follow-up on October 06, 2020.  He will contact the office in the interim with any problems.  Patient seen with Dr. Benay Spice.    [name] ANP/GNP-BC   09/19/2020  2:31 PM  This was a shared visit with [name].  Victor [name] was interviewed and examined.  He has experienced significant hematologic recovery over the past few weeks.  The plan is to begin 5-azacytidine and venetoclax today.  We contacted the leukemia service at [facility] to confirm the treatment plan.  [name], MD

## 2020-09-19 NOTE — Patient Instructions (Signed)
Azacitidine suspension for injection (subcutaneous use) What is this medicine? AZACITIDINE (ay za SITE i deen) is a chemotherapy drug. This medicine reduces the growth of cancer cells and can suppress the immune system. It is used for treating myelodysplastic syndrome or some types of leukemia. This medicine may be used for other purposes; ask your health care provider or pharmacist if you have questions. COMMON BRAND NAME(S): Vidaza What should I tell my health care provider before I take this medicine? They need to know if you have any of these conditions:  kidney disease  liver disease  liver tumors  an unusual or allergic reaction to azacitidine, mannitol, other medicines, foods, dyes, or preservatives  pregnant or trying to get pregnant  breast-feeding How should I use this medicine? This medicine is for injection under the skin. It is administered in a hospital or clinic by a specially trained health care professional. Talk to your pediatrician regarding the use of this medicine in children. While this drug may be prescribed for selected conditions, precautions do apply. Overdosage: If you think you have taken too much of this medicine contact a poison control center or emergency room at once. NOTE: This medicine is only for you. Do not share this medicine with others. What if I miss a dose? It is important not to miss your dose. Call your doctor or health care professional if you are unable to keep an appointment. What may interact with this medicine? Interactions have not been studied. Give your health care provider a list of all the medicines, herbs, non-prescription drugs, or dietary supplements you use. Also tell them if you smoke, drink alcohol, or use illegal drugs. Some items may interact with your medicine. This list may not describe all possible interactions. Give your health care provider a list of all the medicines, herbs, non-prescription drugs, or dietary supplements  you use. Also tell them if you smoke, drink alcohol, or use illegal drugs. Some items may interact with your medicine. What should I watch for while using this medicine? Visit your doctor for checks on your progress. This drug may make you feel generally unwell. This is not uncommon, as chemotherapy can affect healthy cells as well as cancer cells. Report any side effects. Continue your course of treatment even though you feel ill unless your doctor tells you to stop. In some cases, you may be given additional medicines to help with side effects. Follow all directions for their use. Call your doctor or health care professional for advice if you get a fever, chills or sore throat, or other symptoms of a cold or flu. Do not treat yourself. This drug decreases your body's ability to fight infections. Try to avoid being around people who are sick. This medicine may increase your risk to bruise or bleed. Call your doctor or health care professional if you notice any unusual bleeding. You may need blood work done while you are taking this medicine. Do not become pregnant while taking this medicine and for 6 months after the last dose. Women should inform their doctor if they wish to become pregnant or think they might be pregnant. Men should not father a child while taking this medicine and for 3 months after the last dose. There is a potential for serious side effects to an unborn child. Talk to your health care professional or pharmacist for more information. Do not breast-feed an infant while taking this medicine and for 1 week after the last dose. This medicine may interfere with   the ability to have a child. Talk with your doctor or health care professional if you are concerned about your fertility. What side effects may I notice from receiving this medicine? Side effects that you should report to your doctor or health care professional as soon as possible:  allergic reactions like skin rash, itching or  hives, swelling of the face, lips, or tongue  low blood counts - this medicine may decrease the number of white blood cells, red blood cells and platelets. You may be at increased risk for infections and bleeding.  signs of infection - fever or chills, cough, sore throat, pain passing urine  signs of decreased platelets or bleeding - bruising, pinpoint red spots on the skin, black, tarry stools, blood in the urine  signs of decreased red blood cells - unusually weak or tired, fainting spells, lightheadedness  signs and symptoms of kidney injury like trouble passing urine or change in the amount of urine  signs and symptoms of liver injury like dark yellow or brown urine; general ill feeling or flu-like symptoms; light-colored stools; loss of appetite; nausea; right upper belly pain; unusually weak or tired; yellowing of the eyes or skin Side effects that usually do not require medical attention (report to your doctor or health care professional if they continue or are bothersome):  constipation  diarrhea  nausea, vomiting  pain or redness at the injection site  unusually weak or tired This list may not describe all possible side effects. Call your doctor for medical advice about side effects. You may report side effects to FDA at 1-800-FDA-1088. Where should I keep my medicine? This drug is given in a hospital or clinic and will not be stored at home. NOTE: This sheet is a summary. It may not cover all possible information. If you have questions about this medicine, talk to your doctor, pharmacist, or health care provider.  2020 Elsevier/Gold Standard (2016-11-20 14:37:51)  

## 2020-09-20 ENCOUNTER — Inpatient Hospital Stay: Payer: Medicare Other

## 2020-09-20 ENCOUNTER — Other Ambulatory Visit: Payer: Self-pay

## 2020-09-20 VITALS — BP 162/55 | HR 88 | Temp 97.7°F | Resp 18

## 2020-09-20 DIAGNOSIS — C9200 Acute myeloblastic leukemia, not having achieved remission: Secondary | ICD-10-CM | POA: Diagnosis not present

## 2020-09-20 DIAGNOSIS — C9500 Acute leukemia of unspecified cell type not having achieved remission: Secondary | ICD-10-CM

## 2020-09-20 MED ORDER — HEPARIN SOD (PORK) LOCK FLUSH 100 UNIT/ML IV SOLN
250.0000 [IU] | Freq: Once | INTRAVENOUS | Status: AC | PRN
  Administered 2020-09-20: 250 [IU]
  Filled 2020-09-20: qty 5

## 2020-09-20 MED ORDER — SODIUM CHLORIDE 0.9 % IV SOLN
75.0000 mg/m2 | Freq: Once | INTRAVENOUS | Status: AC
  Administered 2020-09-20: 155 mg via INTRAVENOUS
  Filled 2020-09-20: qty 15.5

## 2020-09-20 MED ORDER — SODIUM CHLORIDE 0.9% FLUSH
10.0000 mL | INTRAVENOUS | Status: DC | PRN
  Administered 2020-09-20: 10 mL
  Filled 2020-09-20: qty 10

## 2020-09-20 MED ORDER — SODIUM CHLORIDE 0.9 % IV SOLN
Freq: Once | INTRAVENOUS | Status: AC
  Filled 2020-09-20: qty 250

## 2020-09-20 MED ORDER — SODIUM CHLORIDE 0.9 % IV SOLN
10.0000 mg | Freq: Once | INTRAVENOUS | Status: AC
  Administered 2020-09-20: 10 mg via INTRAVENOUS
  Filled 2020-09-20: qty 10

## 2020-09-20 NOTE — Patient Instructions (Signed)
Azacitidine suspension for injection (subcutaneous use) What is this medicine? AZACITIDINE (ay za SITE i deen) is a chemotherapy drug. This medicine reduces the growth of cancer cells and can suppress the immune system. It is used for treating myelodysplastic syndrome or some types of leukemia. This medicine may be used for other purposes; ask your health care provider or pharmacist if you have questions. COMMON BRAND NAME(S): Vidaza What should I tell my health care provider before I take this medicine? They need to know if you have any of these conditions:  kidney disease  liver disease  liver tumors  an unusual or allergic reaction to azacitidine, mannitol, other medicines, foods, dyes, or preservatives  pregnant or trying to get pregnant  breast-feeding How should I use this medicine? This medicine is for injection under the skin. It is administered in a hospital or clinic by a specially trained health care professional. Talk to your pediatrician regarding the use of this medicine in children. While this drug may be prescribed for selected conditions, precautions do apply. Overdosage: If you think you have taken too much of this medicine contact a poison control center or emergency room at once. NOTE: This medicine is only for you. Do not share this medicine with others. What if I miss a dose? It is important not to miss your dose. Call your doctor or health care professional if you are unable to keep an appointment. What may interact with this medicine? Interactions have not been studied. Give your health care provider a list of all the medicines, herbs, non-prescription drugs, or dietary supplements you use. Also tell them if you smoke, drink alcohol, or use illegal drugs. Some items may interact with your medicine. This list may not describe all possible interactions. Give your health care provider a list of all the medicines, herbs, non-prescription drugs, or dietary supplements  you use. Also tell them if you smoke, drink alcohol, or use illegal drugs. Some items may interact with your medicine. What should I watch for while using this medicine? Visit your doctor for checks on your progress. This drug may make you feel generally unwell. This is not uncommon, as chemotherapy can affect healthy cells as well as cancer cells. Report any side effects. Continue your course of treatment even though you feel ill unless your doctor tells you to stop. In some cases, you may be given additional medicines to help with side effects. Follow all directions for their use. Call your doctor or health care professional for advice if you get a fever, chills or sore throat, or other symptoms of a cold or flu. Do not treat yourself. This drug decreases your body's ability to fight infections. Try to avoid being around people who are sick. This medicine may increase your risk to bruise or bleed. Call your doctor or health care professional if you notice any unusual bleeding. You may need blood work done while you are taking this medicine. Do not become pregnant while taking this medicine and for 6 months after the last dose. Women should inform their doctor if they wish to become pregnant or think they might be pregnant. Men should not father a child while taking this medicine and for 3 months after the last dose. There is a potential for serious side effects to an unborn child. Talk to your health care professional or pharmacist for more information. Do not breast-feed an infant while taking this medicine and for 1 week after the last dose. This medicine may interfere with   the ability to have a child. Talk with your doctor or health care professional if you are concerned about your fertility. What side effects may I notice from receiving this medicine? Side effects that you should report to your doctor or health care professional as soon as possible:  allergic reactions like skin rash, itching or  hives, swelling of the face, lips, or tongue  low blood counts - this medicine may decrease the number of white blood cells, red blood cells and platelets. You may be at increased risk for infections and bleeding.  signs of infection - fever or chills, cough, sore throat, pain passing urine  signs of decreased platelets or bleeding - bruising, pinpoint red spots on the skin, black, tarry stools, blood in the urine  signs of decreased red blood cells - unusually weak or tired, fainting spells, lightheadedness  signs and symptoms of kidney injury like trouble passing urine or change in the amount of urine  signs and symptoms of liver injury like dark yellow or brown urine; general ill feeling or flu-like symptoms; light-colored stools; loss of appetite; nausea; right upper belly pain; unusually weak or tired; yellowing of the eyes or skin Side effects that usually do not require medical attention (report to your doctor or health care professional if they continue or are bothersome):  constipation  diarrhea  nausea, vomiting  pain or redness at the injection site  unusually weak or tired This list may not describe all possible side effects. Call your doctor for medical advice about side effects. You may report side effects to FDA at 1-800-FDA-1088. Where should I keep my medicine? This drug is given in a hospital or clinic and will not be stored at home. NOTE: This sheet is a summary. It may not cover all possible information. If you have questions about this medicine, talk to your doctor, pharmacist, or health care provider.  2020 Elsevier/Gold Standard (2016-11-20 14:37:51)  

## 2020-09-21 ENCOUNTER — Inpatient Hospital Stay: Payer: Medicare Other

## 2020-09-21 ENCOUNTER — Telehealth: Payer: Self-pay | Admitting: Nurse Practitioner

## 2020-09-21 ENCOUNTER — Other Ambulatory Visit: Payer: Self-pay | Admitting: *Deleted

## 2020-09-21 ENCOUNTER — Other Ambulatory Visit: Payer: Self-pay | Admitting: Oncology

## 2020-09-21 VITALS — BP 120/52 | HR 73 | Temp 98.0°F | Resp 20

## 2020-09-21 DIAGNOSIS — C9500 Acute leukemia of unspecified cell type not having achieved remission: Secondary | ICD-10-CM

## 2020-09-21 DIAGNOSIS — C9200 Acute myeloblastic leukemia, not having achieved remission: Secondary | ICD-10-CM | POA: Diagnosis not present

## 2020-09-21 MED ORDER — SODIUM CHLORIDE 0.9% FLUSH
10.0000 mL | INTRAVENOUS | Status: DC | PRN
  Administered 2020-09-21: 10 mL
  Filled 2020-09-21: qty 10

## 2020-09-21 MED ORDER — SODIUM CHLORIDE 0.9 % IV SOLN
75.0000 mg/m2 | Freq: Once | INTRAVENOUS | Status: AC
  Administered 2020-09-21: 155 mg via INTRAVENOUS
  Filled 2020-09-21: qty 15.5

## 2020-09-21 MED ORDER — HEPARIN SOD (PORK) LOCK FLUSH 100 UNIT/ML IV SOLN
500.0000 [IU] | Freq: Once | INTRAVENOUS | Status: DC | PRN
  Filled 2020-09-21: qty 5

## 2020-09-21 MED ORDER — PALONOSETRON HCL INJECTION 0.25 MG/5ML
0.2500 mg | Freq: Once | INTRAVENOUS | Status: AC
  Administered 2020-09-21: 0.25 mg via INTRAVENOUS

## 2020-09-21 MED ORDER — HEPARIN SOD (PORK) LOCK FLUSH 100 UNIT/ML IV SOLN
250.0000 [IU] | Freq: Once | INTRAVENOUS | Status: AC | PRN
  Administered 2020-09-21: 250 [IU]
  Filled 2020-09-21: qty 5

## 2020-09-21 MED ORDER — SODIUM CHLORIDE 0.9% FLUSH
3.0000 mL | INTRAVENOUS | Status: DC | PRN
  Filled 2020-09-21: qty 10

## 2020-09-21 MED ORDER — SODIUM CHLORIDE 0.9 % IV SOLN
Freq: Once | INTRAVENOUS | Status: AC
  Filled 2020-09-21: qty 250

## 2020-09-21 MED ORDER — PALONOSETRON HCL INJECTION 0.25 MG/5ML
INTRAVENOUS | Status: AC
  Filled 2020-09-21: qty 5

## 2020-09-21 MED ORDER — SODIUM CHLORIDE 0.9 % IV SOLN
10.0000 mg | Freq: Once | INTRAVENOUS | Status: AC
  Administered 2020-09-21: 10 mg via INTRAVENOUS
  Filled 2020-09-21: qty 10

## 2020-09-21 NOTE — Patient Instructions (Signed)
[facility] Discharge Instructions for Patients Receiving Chemotherapy  Today you received the following chemotherapy agent: Azacitidine (Vidaza)  To help prevent nausea and vomiting after your treatment, we encourage you to take your nausea medication as directed by your MD.   If you develop nausea and vomiting that is not controlled by your nausea medication, call the clinic.   BELOW ARE SYMPTOMS THAT SHOULD BE REPORTED IMMEDIATELY:  *FEVER GREATER THAN 100.5 F  *CHILLS WITH OR WITHOUT FEVER  NAUSEA AND VOMITING THAT IS NOT CONTROLLED WITH YOUR NAUSEA MEDICATION  *UNUSUAL SHORTNESS OF BREATH  *UNUSUAL BRUISING OR BLEEDING  TENDERNESS IN MOUTH AND THROAT WITH OR WITHOUT PRESENCE OF ULCERS  *URINARY PROBLEMS  *BOWEL PROBLEMS  UNUSUAL RASH Items with * indicate a potential emergency and should be followed up as soon as possible.  Feel free to call the clinic should you have any questions or concerns. The clinic phone number is [phone].  Please show the CHEMO ALERT CARD at check-in to the Emergency Department and triage nurse.   

## 2020-09-21 NOTE — Telephone Encounter (Signed)
Scheduled appointments per 11/15 los. Will have updated calendar printed for patient at next visit.

## 2020-09-22 ENCOUNTER — Other Ambulatory Visit: Payer: Self-pay

## 2020-09-22 ENCOUNTER — Other Ambulatory Visit: Payer: Self-pay | Admitting: *Deleted

## 2020-09-22 ENCOUNTER — Inpatient Hospital Stay: Payer: Medicare Other

## 2020-09-22 VITALS — BP 126/48 | HR 74 | Temp 98.2°F | Resp 18

## 2020-09-22 DIAGNOSIS — C9500 Acute leukemia of unspecified cell type not having achieved remission: Secondary | ICD-10-CM

## 2020-09-22 DIAGNOSIS — Z452 Encounter for adjustment and management of vascular access device: Secondary | ICD-10-CM

## 2020-09-22 DIAGNOSIS — C9200 Acute myeloblastic leukemia, not having achieved remission: Secondary | ICD-10-CM | POA: Diagnosis not present

## 2020-09-22 LAB — CMP (CANCER CENTER ONLY)
ALT: 7 U/L (ref 0–44)
AST: 10 U/L — ABNORMAL LOW (ref 15–41)
Albumin: 3.2 g/dL — ABNORMAL LOW (ref 3.5–5.0)
Alkaline Phosphatase: 68 U/L (ref 38–126)
Anion gap: 7 (ref 5–15)
BUN: 22 mg/dL (ref 8–23)
CO2: 26 mmol/L (ref 22–32)
Calcium: 7.9 mg/dL — ABNORMAL LOW (ref 8.9–10.3)
Chloride: 107 mmol/L (ref 98–111)
Creatinine: 1.03 mg/dL (ref 0.61–1.24)
GFR, Estimated: >60 mL/min (ref >60)
Glucose, Bld: 131 mg/dL — ABNORMAL HIGH (ref 70–99)
Potassium: 3.6 mmol/L (ref 3.5–5.1)
Sodium: 140 mmol/L (ref 135–145)
Total Bilirubin: 0.6 mg/dL (ref 0.3–1.2)
Total Protein: 5.5 g/dL — ABNORMAL LOW (ref 6.5–8.1)

## 2020-09-22 LAB — CBC WITH DIFFERENTIAL (CANCER CENTER ONLY)
Abs Immature Granulocytes: 0.08 10*3/uL — ABNORMAL HIGH (ref 0.00–0.07)
Basophils Absolute: 0 10*3/uL (ref 0.0–0.1)
Basophils Relative: 0 %
Eosinophils Absolute: 0 10*3/uL (ref 0.0–0.5)
Eosinophils Relative: 0 %
HCT: 28.9 % — ABNORMAL LOW (ref 39.0–52.0)
Hemoglobin: 9.3 g/dL — ABNORMAL LOW (ref 13.0–17.0)
Immature Granulocytes: 1 %
Lymphocytes Relative: 7 %
Lymphs Abs: 0.4 10*3/uL — ABNORMAL LOW (ref 0.7–4.0)
MCH: 30.5 pg (ref 26.0–34.0)
MCHC: 32.2 g/dL (ref 30.0–36.0)
MCV: 94.8 fL (ref 80.0–100.0)
Monocytes Absolute: 0.3 10*3/uL (ref 0.1–1.0)
Monocytes Relative: 6 %
Neutro Abs: 4.8 10*3/uL (ref 1.7–7.7)
Neutrophils Relative %: 86 %
Platelet Count: 75 10*3/uL — ABNORMAL LOW (ref 150–400)
RBC: 3.05 MIL/uL — ABNORMAL LOW (ref 4.22–5.81)
RDW: 21.4 % — ABNORMAL HIGH (ref 11.5–15.5)
WBC Count: 5.6 10*3/uL (ref 4.0–10.5)
nRBC: 0 % (ref 0.0–0.2)

## 2020-09-22 LAB — MAGNESIUM: Magnesium: 2.1 mg/dL (ref 1.7–2.4)

## 2020-09-22 MED ORDER — HEPARIN SOD (PORK) LOCK FLUSH 100 UNIT/ML IV SOLN
250.0000 [IU] | Freq: Once | INTRAVENOUS | Status: AC | PRN
  Administered 2020-09-22: 250 [IU]
  Filled 2020-09-22: qty 5

## 2020-09-22 MED ORDER — SODIUM CHLORIDE 0.9 % IV SOLN
10.0000 mg | Freq: Once | INTRAVENOUS | Status: AC
  Administered 2020-09-22: 10 mg via INTRAVENOUS
  Filled 2020-09-22: qty 10

## 2020-09-22 MED ORDER — SODIUM CHLORIDE 0.9% FLUSH
3.0000 mL | INTRAVENOUS | Status: DC | PRN
  Administered 2020-09-22: 3 mL
  Filled 2020-09-22: qty 10

## 2020-09-22 MED ORDER — SODIUM CHLORIDE 0.9 % IV SOLN
Freq: Once | INTRAVENOUS | Status: AC
  Filled 2020-09-22: qty 250

## 2020-09-22 MED ORDER — SODIUM CHLORIDE 0.9% FLUSH
10.0000 mL | Freq: Once | INTRAVENOUS | Status: AC
  Administered 2020-09-22: 10 mL
  Filled 2020-09-22: qty 10

## 2020-09-22 MED ORDER — SODIUM CHLORIDE 0.9 % IV SOLN
75.0000 mg/m2 | Freq: Once | INTRAVENOUS | Status: AC
  Administered 2020-09-22: 155 mg via INTRAVENOUS
  Filled 2020-09-22: qty 15.5

## 2020-09-22 NOTE — Progress Notes (Signed)
Per Dr. Benay Spice: OK to treat w/platelets 75,000

## 2020-09-22 NOTE — Patient Instructions (Signed)
[facility] Discharge Instructions for Patients Receiving Chemotherapy  Today you received the following chemotherapy agent: Azacitidine (Vidaza)  To help prevent nausea and vomiting after your treatment, we encourage you to take your nausea medication as directed by your MD.   If you develop nausea and vomiting that is not controlled by your nausea medication, call the clinic.   BELOW ARE SYMPTOMS THAT SHOULD BE REPORTED IMMEDIATELY:  *FEVER GREATER THAN 100.5 F  *CHILLS WITH OR WITHOUT FEVER  NAUSEA AND VOMITING THAT IS NOT CONTROLLED WITH YOUR NAUSEA MEDICATION  *UNUSUAL SHORTNESS OF BREATH  *UNUSUAL BRUISING OR BLEEDING  TENDERNESS IN MOUTH AND THROAT WITH OR WITHOUT PRESENCE OF ULCERS  *URINARY PROBLEMS  *BOWEL PROBLEMS  UNUSUAL RASH Items with * indicate a potential emergency and should be followed up as soon as possible.  Feel free to call the clinic should you have any questions or concerns. The clinic phone number is [phone].  Please show the CHEMO ALERT CARD at check-in to the Emergency Department and triage nurse.   

## 2020-09-22 NOTE — Patient Instructions (Signed)
PICC Home Care Guide  A peripherally inserted central catheter (PICC) is a form of IV access that allows medicines and IV fluids to be quickly distributed throughout the body. The PICC is a long, thin, flexible tube (catheter) that is inserted into a vein in the upper arm. The catheter ends in a large vein in the chest (superior vena cava, or SVC). After the PICC is inserted, a chest X-ray may be done to make sure that it is in the correct place. A PICC may be placed for different reasons, such as:  To give medicines and liquid nutrition.  To give IV fluids and blood products.  If there is trouble placing a peripheral intravenous (PIV) catheter. If taken care of properly, a PICC can remain in place for several months. Having a PICC can also allow a person to go home from the hospital sooner. Medicine and PICC care can be managed at home by a family member, caregiver, or home health care team. What are the risks? Generally, having a PICC is safe. However, problems may occur, including:  A blood clot (thrombus) forming in or at the tip of the PICC.  A blood clot forming in a vein (deep vein thrombosis) or traveling to the lung (pulmonary embolism).  Inflammation of the vein (phlebitis) in which the PICC is placed.  Infection. Central line associated blood stream infection (CLABSI) is a serious infection that often requires hospitalization.  PICC movement (malposition). The PICC tip may move from its original position due to excessive physical activity, forceful coughing, sneezing, or vomiting.  A break or cut in the PICC. It is important not to use scissors near the PICC.  Nerve or tendon irritation or injury during PICC insertion. How to take care of your PICC Preventing problems  You and any caregivers should wash your hands often with soap. Wash hands: ? Before touching the PICC line or the infusion device. ? Before changing a bandage (dressing).  Flush the PICC as told by your  health care provider. Let your health care provider know right away if the PICC is hard to flush or does not flush. Do not use force to flush the PICC.  Do not use a syringe that is less than 10 mL to flush the PICC.  Avoid blood pressure checks on the arm in which the PICC is placed.  Never pull or tug on the PICC.  Do not take the PICC out yourself. Only a trained clinical professional should remove the PICC.  Use clean and sterile supplies only. Keep the supplies in a dry place. Do not reuse needles, syringes, or any other supplies. Doing that can lead to infection.  Keep pets and children away from your PICC line.  Check the PICC insertion site every day for signs of infection. Check for: ? Leakage. ? Redness, swelling, or pain. ? Fluid or blood. ? Warmth. ? Pus or a bad smell. PICC dressing care  Keep your PICC bandage (dressing) clean and dry to prevent infection.  Do not take baths, swim, or use a hot tub until your health care provider approves. Ask your health care provider if you can take showers. You may only be allowed to take sponge baths for bathing. When you are allowed to shower: ? Ask your health care provider to teach you how to wrap the PICC line. ? Cover the PICC line with clear plastic wrap and tape to keep it dry while showering.  Follow instructions from your health care provider   about how to take care of your insertion site and dressing. Make sure you: ? Wash your hands with soap and water before you change your bandage (dressing). If soap and water are not available, use hand sanitizer. ? Change your dressing as told by your health care provider. ? Leave stitches (sutures), skin glue, or adhesive strips in place. These skin closures may need to stay in place for 2 weeks or longer. If adhesive strip edges start to loosen and curl up, you may trim the loose edges. Do not remove adhesive strips completely unless your health care provider tells you to do  that.  Change your PICC dressing if it becomes loose or wet. General instructions   Carry your PICC identification card or wear a medical alert bracelet at all times.  Keep the tube clamped at all times, unless it is being used.  Carry a smooth-edge clamp with you at all times to place on the tube if it breaks.  Do not use scissors or sharp objects near the tube.  You may bend your arm and move it freely. If your PICC is near or at the bend of your elbow, avoid activity with repeated motion at the elbow.  Avoid lifting heavy objects as told by your health care provider.  Keep all follow-up visits as told by your health care provider. This is important. Disposal of supplies  Throw away any syringes in a disposal container that is meant for sharp items (sharps container). You can buy a sharps container from a pharmacy, or you can make one by using an empty hard plastic bottle with a cover.  Place any used dressings or infusion bags into a plastic bag. Throw that bag in the trash. Contact a health care provider if:  You have pain in your arm, ear, face, or teeth.  You have a fever or chills.  You have redness, swelling, or pain around the insertion site.  You have fluid or blood coming from the insertion site.  Your insertion site feels warm to the touch.  You have pus or a bad smell coming from the insertion site.  Your skin feels hard and raised around the insertion site. Get help right away if:  Your PICC is accidentally pulled all the way out. If this happens, cover the insertion site with a bandage or gauze dressing. Do not throw the PICC away. Your health care provider will need to check it.  Your PICC was tugged or pulled and has partially come out. Do not  push the PICC back in.  You cannot flush the PICC, it is hard to flush, or the PICC leaks around the insertion site when it is flushed.  You hear a "flushing" sound when the PICC is flushed.  You feel your  heart racing or skipping beats.  There is a hole or tear in the PICC.  You have swelling in the arm in which the PICC was inserted.  You have a red streak going up your arm from where the PICC was inserted. Summary  A peripherally inserted central catheter (PICC) is a long, thin, flexible tube (catheter) that is inserted into a vein in the upper arm.  The PICC is inserted using a sterile technique by a specially trained nurse or physician. Only a trained clinical professional should remove it.  Keep your PICC identification card with you at all times.  Avoid blood pressure checks on the arm in which the PICC is placed.  If cared for   properly, a PICC can remain in place for several months. Having a PICC can also allow a person to go home from the hospital sooner. This information is not intended to replace advice given to you by your health care provider. Make sure you discuss any questions you have with your health care provider. Document Revised: 10/04/2017 Document Reviewed: 11/24/2016 Elsevier Patient Education  2020 [employer]  

## 2020-09-22 NOTE — Progress Notes (Signed)
Faxed today's labs to [facility] [phone] (no critical results).

## 2020-09-23 ENCOUNTER — Inpatient Hospital Stay: Payer: Medicare Other

## 2020-09-23 ENCOUNTER — Ambulatory Visit: Payer: Medicare Other | Admitting: Internal Medicine

## 2020-09-23 ENCOUNTER — Other Ambulatory Visit: Payer: Self-pay

## 2020-09-23 VITALS — BP 134/91 | HR 81 | Temp 97.8°F | Resp 18

## 2020-09-23 DIAGNOSIS — C9500 Acute leukemia of unspecified cell type not having achieved remission: Secondary | ICD-10-CM

## 2020-09-23 DIAGNOSIS — C9200 Acute myeloblastic leukemia, not having achieved remission: Secondary | ICD-10-CM | POA: Diagnosis not present

## 2020-09-23 MED ORDER — SODIUM CHLORIDE 0.9 % IV SOLN
75.0000 mg/m2 | Freq: Once | INTRAVENOUS | Status: AC
  Administered 2020-09-23: 155 mg via INTRAVENOUS
  Filled 2020-09-23: qty 15.5

## 2020-09-23 MED ORDER — SODIUM CHLORIDE 0.9% FLUSH
3.0000 mL | INTRAVENOUS | Status: DC | PRN
  Administered 2020-09-23: 3 mL
  Filled 2020-09-23: qty 10

## 2020-09-23 MED ORDER — PALONOSETRON HCL INJECTION 0.25 MG/5ML
0.2500 mg | Freq: Once | INTRAVENOUS | Status: AC
  Administered 2020-09-23: 0.25 mg via INTRAVENOUS

## 2020-09-23 MED ORDER — SODIUM CHLORIDE 0.9 % IV SOLN
Freq: Once | INTRAVENOUS | Status: AC
  Filled 2020-09-23: qty 250

## 2020-09-23 MED ORDER — SODIUM CHLORIDE 0.9 % IV SOLN
10.0000 mg | Freq: Once | INTRAVENOUS | Status: AC
  Administered 2020-09-23: 10 mg via INTRAVENOUS
  Filled 2020-09-23: qty 10

## 2020-09-23 MED ORDER — HEPARIN SOD (PORK) LOCK FLUSH 100 UNIT/ML IV SOLN
250.0000 [IU] | Freq: Once | INTRAVENOUS | Status: AC | PRN
  Administered 2020-09-23: 250 [IU]
  Filled 2020-09-23: qty 5

## 2020-09-23 MED ORDER — PALONOSETRON HCL INJECTION 0.25 MG/5ML
INTRAVENOUS | Status: AC
  Filled 2020-09-23: qty 5

## 2020-09-23 NOTE — Patient Instructions (Signed)
[facility] Discharge Instructions for Patients Receiving Chemotherapy  Today you received the following chemotherapy agent: Azacitidine (Vidaza)  To help prevent nausea and vomiting after your treatment, we encourage you to take your nausea medication as directed by your MD.   If you develop nausea and vomiting that is not controlled by your nausea medication, call the clinic.   BELOW ARE SYMPTOMS THAT SHOULD BE REPORTED IMMEDIATELY:  *FEVER GREATER THAN 100.5 F  *CHILLS WITH OR WITHOUT FEVER  NAUSEA AND VOMITING THAT IS NOT CONTROLLED WITH YOUR NAUSEA MEDICATION  *UNUSUAL SHORTNESS OF BREATH  *UNUSUAL BRUISING OR BLEEDING  TENDERNESS IN MOUTH AND THROAT WITH OR WITHOUT PRESENCE OF ULCERS  *URINARY PROBLEMS  *BOWEL PROBLEMS  UNUSUAL RASH Items with * indicate a potential emergency and should be followed up as soon as possible.  Feel free to call the clinic should you have any questions or concerns. The clinic phone number is [phone].  Please show the CHEMO ALERT CARD at check-in to the Emergency Department and triage nurse.   

## 2020-09-26 ENCOUNTER — Ambulatory Visit: Payer: Medicare Other

## 2020-09-26 ENCOUNTER — Inpatient Hospital Stay: Payer: Medicare Other

## 2020-09-26 ENCOUNTER — Other Ambulatory Visit: Payer: Self-pay

## 2020-09-26 VITALS — BP 103/44 | HR 76 | Temp 97.7°F | Resp 18 | Wt 191.0 lb

## 2020-09-26 DIAGNOSIS — C9200 Acute myeloblastic leukemia, not having achieved remission: Secondary | ICD-10-CM | POA: Diagnosis not present

## 2020-09-26 DIAGNOSIS — Z452 Encounter for adjustment and management of vascular access device: Secondary | ICD-10-CM

## 2020-09-26 DIAGNOSIS — C9500 Acute leukemia of unspecified cell type not having achieved remission: Secondary | ICD-10-CM

## 2020-09-26 LAB — CMP (CANCER CENTER ONLY)
ALT: 17 U/L (ref 0–44)
AST: 11 U/L — ABNORMAL LOW (ref 15–41)
Albumin: 3.1 g/dL — ABNORMAL LOW (ref 3.5–5.0)
Alkaline Phosphatase: 64 U/L (ref 38–126)
Anion gap: 7 (ref 5–15)
BUN: 18 mg/dL (ref 8–23)
CO2: 25 mmol/L (ref 22–32)
Calcium: 7.8 mg/dL — ABNORMAL LOW (ref 8.9–10.3)
Chloride: 106 mmol/L (ref 98–111)
Creatinine: 1.01 mg/dL (ref 0.61–1.24)
GFR, Estimated: >60 mL/min (ref >60)
Glucose, Bld: 105 mg/dL — ABNORMAL HIGH (ref 70–99)
Potassium: 3.2 mmol/L — ABNORMAL LOW (ref 3.5–5.1)
Sodium: 138 mmol/L (ref 135–145)
Total Bilirubin: 0.9 mg/dL (ref 0.3–1.2)
Total Protein: 5.2 g/dL — ABNORMAL LOW (ref 6.5–8.1)

## 2020-09-26 LAB — CBC WITH DIFFERENTIAL (CANCER CENTER ONLY)
Abs Immature Granulocytes: 0.03 10*3/uL (ref 0.00–0.07)
Basophils Absolute: 0 10*3/uL (ref 0.0–0.1)
Basophils Relative: 0 %
Eosinophils Absolute: 0 10*3/uL (ref 0.0–0.5)
Eosinophils Relative: 0 %
HCT: 32.2 % — ABNORMAL LOW (ref 39.0–52.0)
Hemoglobin: 10.4 g/dL — ABNORMAL LOW (ref 13.0–17.0)
Immature Granulocytes: 1 %
Lymphocytes Relative: 32 %
Lymphs Abs: 1 10*3/uL (ref 0.7–4.0)
MCH: 30.5 pg (ref 26.0–34.0)
MCHC: 32.3 g/dL (ref 30.0–36.0)
MCV: 94.4 fL (ref 80.0–100.0)
Monocytes Absolute: 0.1 10*3/uL (ref 0.1–1.0)
Monocytes Relative: 5 %
Neutro Abs: 1.9 10*3/uL (ref 1.7–7.7)
Neutrophils Relative %: 62 %
Platelet Count: 68 10*3/uL — ABNORMAL LOW (ref 150–400)
RBC: 3.41 MIL/uL — ABNORMAL LOW (ref 4.22–5.81)
RDW: 21.5 % — ABNORMAL HIGH (ref 11.5–15.5)
WBC Count: 3 10*3/uL — ABNORMAL LOW (ref 4.0–10.5)
nRBC: 0.7 % — ABNORMAL HIGH (ref 0.0–0.2)

## 2020-09-26 LAB — MAGNESIUM: Magnesium: 2.1 mg/dL (ref 1.7–2.4)

## 2020-09-26 MED ORDER — SODIUM CHLORIDE 0.9 % IV SOLN
75.0000 mg/m2 | Freq: Once | INTRAVENOUS | Status: AC
  Administered 2020-09-26: 155 mg via INTRAVENOUS
  Filled 2020-09-26: qty 15.5

## 2020-09-26 MED ORDER — HEPARIN SOD (PORK) LOCK FLUSH 100 UNIT/ML IV SOLN
250.0000 [IU] | Freq: Once | INTRAVENOUS | Status: AC | PRN
  Administered 2020-09-26: 250 [IU]
  Filled 2020-09-26: qty 5

## 2020-09-26 MED ORDER — SODIUM CHLORIDE 0.9 % IV SOLN
Freq: Once | INTRAVENOUS | Status: AC
  Filled 2020-09-26: qty 250

## 2020-09-26 MED ORDER — PALONOSETRON HCL INJECTION 0.25 MG/5ML
0.2500 mg | Freq: Once | INTRAVENOUS | Status: AC
  Administered 2020-09-26: 0.25 mg via INTRAVENOUS

## 2020-09-26 MED ORDER — PALONOSETRON HCL INJECTION 0.25 MG/5ML
INTRAVENOUS | Status: AC
  Filled 2020-09-26: qty 5

## 2020-09-26 MED ORDER — SODIUM CHLORIDE 0.9% FLUSH
10.0000 mL | INTRAVENOUS | Status: DC | PRN
  Administered 2020-09-26: 10 mL
  Filled 2020-09-26: qty 10

## 2020-09-26 MED ORDER — SODIUM CHLORIDE 0.9 % IV SOLN
10.0000 mg | Freq: Once | INTRAVENOUS | Status: AC
  Administered 2020-09-26: 10 mg via INTRAVENOUS
  Filled 2020-09-26: qty 10

## 2020-09-26 MED ORDER — SODIUM CHLORIDE 0.9% FLUSH
10.0000 mL | Freq: Once | INTRAVENOUS | Status: AC
  Administered 2020-09-26: 10 mL
  Filled 2020-09-26: qty 10

## 2020-09-26 NOTE — Progress Notes (Signed)
Per Dr. Stevphen Meuse to treat w/platelets 68,000 (will hold if < 40)

## 2020-09-26 NOTE — Patient Instructions (Signed)
[facility] Discharge Instructions for Patients Receiving Chemotherapy  Today you received the following chemotherapy agent: Azacitidine (Vidaza)  To help prevent nausea and vomiting after your treatment, we encourage you to take your nausea medication as directed by your MD.   If you develop nausea and vomiting that is not controlled by your nausea medication, call the clinic.   BELOW ARE SYMPTOMS THAT SHOULD BE REPORTED IMMEDIATELY:  *FEVER GREATER THAN 100.5 F  *CHILLS WITH OR WITHOUT FEVER  NAUSEA AND VOMITING THAT IS NOT CONTROLLED WITH YOUR NAUSEA MEDICATION  *UNUSUAL SHORTNESS OF BREATH  *UNUSUAL BRUISING OR BLEEDING  TENDERNESS IN MOUTH AND THROAT WITH OR WITHOUT PRESENCE OF ULCERS  *URINARY PROBLEMS  *BOWEL PROBLEMS  UNUSUAL RASH Items with * indicate a potential emergency and should be followed up as soon as possible.  Feel free to call the clinic should you have any questions or concerns. The clinic phone number is [phone].  Please show the CHEMO ALERT CARD at check-in to the Emergency Department and triage nurse.   

## 2020-09-27 ENCOUNTER — Inpatient Hospital Stay: Payer: Medicare Other

## 2020-09-27 ENCOUNTER — Encounter: Payer: Self-pay | Admitting: *Deleted

## 2020-09-27 ENCOUNTER — Other Ambulatory Visit: Payer: Self-pay

## 2020-09-27 ENCOUNTER — Other Ambulatory Visit: Payer: Medicare Other

## 2020-09-27 VITALS — BP 148/56 | HR 66 | Temp 97.6°F | Resp 18

## 2020-09-27 DIAGNOSIS — C9500 Acute leukemia of unspecified cell type not having achieved remission: Secondary | ICD-10-CM

## 2020-09-27 DIAGNOSIS — C9200 Acute myeloblastic leukemia, not having achieved remission: Secondary | ICD-10-CM | POA: Diagnosis not present

## 2020-09-27 MED ORDER — SODIUM CHLORIDE 0.9 % IV SOLN
Freq: Once | INTRAVENOUS | Status: AC
  Filled 2020-09-27: qty 250

## 2020-09-27 MED ORDER — SODIUM CHLORIDE 0.9% FLUSH
3.0000 mL | INTRAVENOUS | Status: DC | PRN
  Administered 2020-09-27: 3 mL
  Filled 2020-09-27: qty 10

## 2020-09-27 MED ORDER — HEPARIN SOD (PORK) LOCK FLUSH 100 UNIT/ML IV SOLN
250.0000 [IU] | Freq: Once | INTRAVENOUS | Status: AC | PRN
  Administered 2020-09-27: 250 [IU]
  Filled 2020-09-27: qty 5

## 2020-09-27 MED ORDER — SODIUM CHLORIDE 0.9 % IV SOLN
75.0000 mg/m2 | Freq: Once | INTRAVENOUS | Status: AC
  Administered 2020-09-27: 155 mg via INTRAVENOUS
  Filled 2020-09-27: qty 10

## 2020-09-27 MED ORDER — SODIUM CHLORIDE 0.9 % IV SOLN
10.0000 mg | Freq: Once | INTRAVENOUS | Status: AC
  Administered 2020-09-27: 10 mg via INTRAVENOUS
  Filled 2020-09-27: qty 10

## 2020-09-27 NOTE — Patient Instructions (Signed)
[facility] Discharge Instructions for Patients Receiving Chemotherapy  Today you received the following chemotherapy agents: azacitidine.  To help prevent nausea and vomiting after your treatment, we encourage you to take your nausea medication as directed.   If you develop nausea and vomiting that is not controlled by your nausea medication, call the clinic.   BELOW ARE SYMPTOMS THAT SHOULD BE REPORTED IMMEDIATELY:  *FEVER GREATER THAN 100.5 F  *CHILLS WITH OR WITHOUT FEVER  NAUSEA AND VOMITING THAT IS NOT CONTROLLED WITH YOUR NAUSEA MEDICATION  *UNUSUAL SHORTNESS OF BREATH  *UNUSUAL BRUISING OR BLEEDING  TENDERNESS IN MOUTH AND THROAT WITH OR WITHOUT PRESENCE OF ULCERS  *URINARY PROBLEMS  *BOWEL PROBLEMS  UNUSUAL RASH Items with * indicate a potential emergency and should be followed up as soon as possible.  Feel free to call the clinic should you have any questions or concerns. The clinic phone number is [phone].  Please show the CHEMO ALERT CARD at check-in to the Emergency Department and triage nurse.   

## 2020-09-27 NOTE — Progress Notes (Unsigned)
Faxed labs of 09/26/20 to [facility] [phone] with question, "Is Victor [name] managing his electrolytes or [place]". K+ was low at 3.2. There were not critical results to call in.

## 2020-09-28 ENCOUNTER — Other Ambulatory Visit: Payer: Self-pay | Admitting: *Deleted

## 2020-09-28 NOTE — Progress Notes (Signed)
Confirmed w/patient that [facility] sent in script for K+ yesterday and he is taking it.

## 2020-09-30 ENCOUNTER — Inpatient Hospital Stay: Payer: Medicare Other

## 2020-09-30 ENCOUNTER — Telehealth: Payer: Self-pay | Admitting: *Deleted

## 2020-09-30 ENCOUNTER — Other Ambulatory Visit: Payer: Self-pay

## 2020-09-30 DIAGNOSIS — Z452 Encounter for adjustment and management of vascular access device: Secondary | ICD-10-CM

## 2020-09-30 DIAGNOSIS — C9500 Acute leukemia of unspecified cell type not having achieved remission: Secondary | ICD-10-CM

## 2020-09-30 DIAGNOSIS — C9200 Acute myeloblastic leukemia, not having achieved remission: Secondary | ICD-10-CM | POA: Diagnosis not present

## 2020-09-30 LAB — CMP (CANCER CENTER ONLY)
ALT: 8 U/L (ref 0–44)
AST: 8 U/L — ABNORMAL LOW (ref 15–41)
Albumin: 3 g/dL — ABNORMAL LOW (ref 3.5–5.0)
Alkaline Phosphatase: 57 U/L (ref 38–126)
Anion gap: 6 (ref 5–15)
BUN: 17 mg/dL (ref 8–23)
CO2: 27 mmol/L (ref 22–32)
Calcium: 8.3 mg/dL — ABNORMAL LOW (ref 8.9–10.3)
Chloride: 107 mmol/L (ref 98–111)
Creatinine: 1.03 mg/dL (ref 0.61–1.24)
GFR, Estimated: >60 mL/min (ref >60)
Glucose, Bld: 110 mg/dL — ABNORMAL HIGH (ref 70–99)
Potassium: 3.9 mmol/L (ref 3.5–5.1)
Sodium: 140 mmol/L (ref 135–145)
Total Bilirubin: 0.9 mg/dL (ref 0.3–1.2)
Total Protein: 4.8 g/dL — ABNORMAL LOW (ref 6.5–8.1)

## 2020-09-30 LAB — CBC WITH DIFFERENTIAL (CANCER CENTER ONLY)
Abs Immature Granulocytes: 0.01 10*3/uL (ref 0.00–0.07)
Basophils Absolute: 0 10*3/uL (ref 0.0–0.1)
Basophils Relative: 0 %
Eosinophils Absolute: 0 10*3/uL (ref 0.0–0.5)
Eosinophils Relative: 0 %
HCT: 30.2 % — ABNORMAL LOW (ref 39.0–52.0)
Hemoglobin: 9.8 g/dL — ABNORMAL LOW (ref 13.0–17.0)
Immature Granulocytes: 1 %
Lymphocytes Relative: 43 %
Lymphs Abs: 0.9 10*3/uL (ref 0.7–4.0)
MCH: 30.8 pg (ref 26.0–34.0)
MCHC: 32.5 g/dL (ref 30.0–36.0)
MCV: 95 fL (ref 80.0–100.0)
Monocytes Absolute: 0.1 10*3/uL (ref 0.1–1.0)
Monocytes Relative: 6 %
Neutro Abs: 1 10*3/uL — ABNORMAL LOW (ref 1.7–7.7)
Neutrophils Relative %: 50 %
Platelet Count: 46 10*3/uL — ABNORMAL LOW (ref 150–400)
RBC: 3.18 MIL/uL — ABNORMAL LOW (ref 4.22–5.81)
RDW: 22.5 % — ABNORMAL HIGH (ref 11.5–15.5)
WBC Count: 2 10*3/uL — ABNORMAL LOW (ref 4.0–10.5)
nRBC: 0 % (ref 0.0–0.2)

## 2020-09-30 LAB — SAMPLE TO BLOOD BANK

## 2020-09-30 LAB — MAGNESIUM: Magnesium: 2 mg/dL (ref 1.7–2.4)

## 2020-09-30 MED ORDER — SODIUM CHLORIDE 0.9% FLUSH
10.0000 mL | Freq: Once | INTRAVENOUS | Status: AC
  Administered 2020-09-30: 10 mL
  Filled 2020-09-30: qty 10

## 2020-09-30 MED ORDER — HEPARIN SOD (PORK) LOCK FLUSH 100 UNIT/ML IV SOLN
250.0000 [IU] | Freq: Once | INTRAVENOUS | Status: AC
  Administered 2020-09-30: 250 [IU]
  Filled 2020-09-30: qty 5

## 2020-09-30 NOTE — Telephone Encounter (Addendum)
Discussed CBC w/patient and wife. He is neutropenic again, so was instructed to decrease venetoclax to 200 mg/day and resume the Levaquin and fluconazole. Continue acyclovir. He reports he has plenty of each. Neutropenic precautions reviewed and to call w/fever, shaking chills or any s/s of infection. Faxed all results to [facility] #[phone] and criticals called to [place], Debi and informed her he will decrease venetoclax dose and resume the levaquin and fluconazole. Will recheck labs on Monday.

## 2020-10-03 ENCOUNTER — Inpatient Hospital Stay: Payer: Medicare Other

## 2020-10-03 ENCOUNTER — Other Ambulatory Visit: Payer: Self-pay | Admitting: *Deleted

## 2020-10-03 ENCOUNTER — Other Ambulatory Visit: Payer: Self-pay

## 2020-10-03 ENCOUNTER — Telehealth: Payer: Self-pay | Admitting: *Deleted

## 2020-10-03 DIAGNOSIS — C9500 Acute leukemia of unspecified cell type not having achieved remission: Secondary | ICD-10-CM

## 2020-10-03 DIAGNOSIS — Z452 Encounter for adjustment and management of vascular access device: Secondary | ICD-10-CM

## 2020-10-03 DIAGNOSIS — C9200 Acute myeloblastic leukemia, not having achieved remission: Secondary | ICD-10-CM | POA: Diagnosis not present

## 2020-10-03 LAB — CBC WITH DIFFERENTIAL (CANCER CENTER ONLY)
Abs Immature Granulocytes: 0 10*3/uL (ref 0.00–0.07)
Basophils Absolute: 0 10*3/uL (ref 0.0–0.1)
Basophils Relative: 1 %
Eosinophils Absolute: 0 10*3/uL (ref 0.0–0.5)
Eosinophils Relative: 0 %
HCT: 32.1 % — ABNORMAL LOW (ref 39.0–52.0)
Hemoglobin: 10.4 g/dL — ABNORMAL LOW (ref 13.0–17.0)
Immature Granulocytes: 0 %
Lymphocytes Relative: 55 %
Lymphs Abs: 0.8 10*3/uL (ref 0.7–4.0)
MCH: 31.2 pg (ref 26.0–34.0)
MCHC: 32.4 g/dL (ref 30.0–36.0)
MCV: 96.4 fL (ref 80.0–100.0)
Monocytes Absolute: 0.1 10*3/uL (ref 0.1–1.0)
Monocytes Relative: 5 %
Neutro Abs: 0.6 10*3/uL — ABNORMAL LOW (ref 1.7–7.7)
Neutrophils Relative %: 39 %
Platelet Count: 48 10*3/uL — ABNORMAL LOW (ref 150–400)
RBC: 3.33 MIL/uL — ABNORMAL LOW (ref 4.22–5.81)
RDW: 23.4 % — ABNORMAL HIGH (ref 11.5–15.5)
WBC Count: 1.5 10*3/uL — ABNORMAL LOW (ref 4.0–10.5)
nRBC: 0 % (ref 0.0–0.2)

## 2020-10-03 LAB — CMP (CANCER CENTER ONLY)
ALT: 7 U/L (ref 0–44)
AST: 9 U/L — ABNORMAL LOW (ref 15–41)
Albumin: 3.2 g/dL — ABNORMAL LOW (ref 3.5–5.0)
Alkaline Phosphatase: 58 U/L (ref 38–126)
Anion gap: 7 (ref 5–15)
BUN: 14 mg/dL (ref 8–23)
CO2: 24 mmol/L (ref 22–32)
Calcium: 8.3 mg/dL — ABNORMAL LOW (ref 8.9–10.3)
Chloride: 107 mmol/L (ref 98–111)
Creatinine: 1.03 mg/dL (ref 0.61–1.24)
GFR, Estimated: >60 mL/min (ref >60)
Glucose, Bld: 103 mg/dL — ABNORMAL HIGH (ref 70–99)
Potassium: 4.6 mmol/L (ref 3.5–5.1)
Sodium: 138 mmol/L (ref 135–145)
Total Bilirubin: 1 mg/dL (ref 0.3–1.2)
Total Protein: 5.4 g/dL — ABNORMAL LOW (ref 6.5–8.1)

## 2020-10-03 LAB — MAGNESIUM: Magnesium: 2 mg/dL (ref 1.7–2.4)

## 2020-10-03 MED ORDER — SODIUM CHLORIDE 0.9% FLUSH
10.0000 mL | Freq: Once | INTRAVENOUS | Status: AC
  Administered 2020-10-03: 10 mL
  Filled 2020-10-03: qty 10

## 2020-10-03 MED ORDER — HEPARIN SOD (PORK) LOCK FLUSH 100 UNIT/ML IV SOLN
250.0000 [IU] | Freq: Once | INTRAVENOUS | Status: AC
  Administered 2020-10-03: 250 [IU]
  Filled 2020-10-03: qty 5

## 2020-10-03 NOTE — Patient Instructions (Signed)
PICC Home Care Guide  A peripherally inserted central catheter (PICC) is a form of IV access that allows medicines and IV fluids to be quickly distributed throughout the body. The PICC is a long, thin, flexible tube (catheter) that is inserted into a vein in the upper arm. The catheter ends in a large vein in the chest (superior vena cava, or SVC). After the PICC is inserted, a chest X-ray may be done to make sure that it is in the correct place. A PICC may be placed for different reasons, such as:  To give medicines and liquid nutrition.  To give IV fluids and blood products.  If there is trouble placing a peripheral intravenous (PIV) catheter. If taken care of properly, a PICC can remain in place for several months. Having a PICC can also allow a person to go home from the hospital sooner. Medicine and PICC care can be managed at home by a family member, caregiver, or home health care team. What are the risks? Generally, having a PICC is safe. However, problems may occur, including:  A blood clot (thrombus) forming in or at the tip of the PICC.  A blood clot forming in a vein (deep vein thrombosis) or traveling to the lung (pulmonary embolism).  Inflammation of the vein (phlebitis) in which the PICC is placed.  Infection. Central line associated blood stream infection (CLABSI) is a serious infection that often requires hospitalization.  PICC movement (malposition). The PICC tip may move from its original position due to excessive physical activity, forceful coughing, sneezing, or vomiting.  A break or cut in the PICC. It is important not to use scissors near the PICC.  Nerve or tendon irritation or injury during PICC insertion. How to take care of your PICC Preventing problems  You and any caregivers should wash your hands often with soap. Wash hands: ? Before touching the PICC line or the infusion device. ? Before changing a bandage (dressing).  Flush the PICC as told by your  health care provider. Let your health care provider know right away if the PICC is hard to flush or does not flush. Do not use force to flush the PICC.  Do not use a syringe that is less than 10 mL to flush the PICC.  Avoid blood pressure checks on the arm in which the PICC is placed.  Never pull or tug on the PICC.  Do not take the PICC out yourself. Only a trained clinical professional should remove the PICC.  Use clean and sterile supplies only. Keep the supplies in a dry place. Do not reuse needles, syringes, or any other supplies. Doing that can lead to infection.  Keep pets and children away from your PICC line.  Check the PICC insertion site every day for signs of infection. Check for: ? Leakage. ? Redness, swelling, or pain. ? Fluid or blood. ? Warmth. ? Pus or a bad smell. PICC dressing care  Keep your PICC bandage (dressing) clean and dry to prevent infection.  Do not take baths, swim, or use a hot tub until your health care provider approves. Ask your health care provider if you can take showers. You may only be allowed to take sponge baths for bathing. When you are allowed to shower: ? Ask your health care provider to teach you how to wrap the PICC line. ? Cover the PICC line with clear plastic wrap and tape to keep it dry while showering.  Follow instructions from your health care provider   about how to take care of your insertion site and dressing. Make sure you: ? Wash your hands with soap and water before you change your bandage (dressing). If soap and water are not available, use hand sanitizer. ? Change your dressing as told by your health care provider. ? Leave stitches (sutures), skin glue, or adhesive strips in place. These skin closures may need to stay in place for 2 weeks or longer. If adhesive strip edges start to loosen and curl up, you may trim the loose edges. Do not remove adhesive strips completely unless your health care provider tells you to do  that.  Change your PICC dressing if it becomes loose or wet. General instructions   Carry your PICC identification card or wear a medical alert bracelet at all times.  Keep the tube clamped at all times, unless it is being used.  Carry a smooth-edge clamp with you at all times to place on the tube if it breaks.  Do not use scissors or sharp objects near the tube.  You may bend your arm and move it freely. If your PICC is near or at the bend of your elbow, avoid activity with repeated motion at the elbow.  Avoid lifting heavy objects as told by your health care provider.  Keep all follow-up visits as told by your health care provider. This is important. Disposal of supplies  Throw away any syringes in a disposal container that is meant for sharp items (sharps container). You can buy a sharps container from a pharmacy, or you can make one by using an empty hard plastic bottle with a cover.  Place any used dressings or infusion bags into a plastic bag. Throw that bag in the trash. Contact a health care provider if:  You have pain in your arm, ear, face, or teeth.  You have a fever or chills.  You have redness, swelling, or pain around the insertion site.  You have fluid or blood coming from the insertion site.  Your insertion site feels warm to the touch.  You have pus or a bad smell coming from the insertion site.  Your skin feels hard and raised around the insertion site. Get help right away if:  Your PICC is accidentally pulled all the way out. If this happens, cover the insertion site with a bandage or gauze dressing. Do not throw the PICC away. Your health care provider will need to check it.  Your PICC was tugged or pulled and has partially come out. Do not  push the PICC back in.  You cannot flush the PICC, it is hard to flush, or the PICC leaks around the insertion site when it is flushed.  You hear a "flushing" sound when the PICC is flushed.  You feel your  heart racing or skipping beats.  There is a hole or tear in the PICC.  You have swelling in the arm in which the PICC was inserted.  You have a red streak going up your arm from where the PICC was inserted. Summary  A peripherally inserted central catheter (PICC) is a long, thin, flexible tube (catheter) that is inserted into a vein in the upper arm.  The PICC is inserted using a sterile technique by a specially trained nurse or physician. Only a trained clinical professional should remove it.  Keep your PICC identification card with you at all times.  Avoid blood pressure checks on the arm in which the PICC is placed.  If cared for   properly, a PICC can remain in place for several months. Having a PICC can also allow a person to go home from the hospital sooner. This information is not intended to replace advice given to you by your health care provider. Make sure you discuss any questions you have with your health care provider. Document Revised: 10/04/2017 Document Reviewed: 11/24/2016 Elsevier Patient Education  2020 [employer]  

## 2020-10-03 NOTE — Telephone Encounter (Signed)
Discussed lab results w/patient. He is still neutropenic--reviewed neutropenic precautions again and to continue he acyclovir, fluconazole, levaquin and reduced dose of venetoclax. K+ is also normal now, but needs to continue to take it. Will return on 10/06/20.  Faxed labs to [facility] #[phone].

## 2020-10-04 ENCOUNTER — Ambulatory Visit (INDEPENDENT_AMBULATORY_CARE_PROVIDER_SITE_OTHER): Payer: Medicare Other | Admitting: Family Medicine

## 2020-10-04 ENCOUNTER — Encounter: Payer: Self-pay | Admitting: Family Medicine

## 2020-10-04 VITALS — BP 122/73 | HR 83 | Ht 69.0 in | Wt 190.0 lb

## 2020-10-04 DIAGNOSIS — C9500 Acute leukemia of unspecified cell type not having achieved remission: Secondary | ICD-10-CM

## 2020-10-04 DIAGNOSIS — S2231XA Fracture of one rib, right side, initial encounter for closed fracture: Secondary | ICD-10-CM | POA: Insufficient documentation

## 2020-10-04 DIAGNOSIS — W19XXXA Unspecified fall, initial encounter: Secondary | ICD-10-CM | POA: Diagnosis not present

## 2020-10-04 NOTE — Assessment & Plan Note (Signed)
Undergoing current treatment.  Has fatigue and has lost about 30 pounds. -Counseled on supportive care. -Provided samples of Ensure and Glucerna.  Can see if we can provide him more if he likes these -Referral to physical therapy.

## 2020-10-04 NOTE — Assessment & Plan Note (Signed)
Had a fall on 10/21.  He feels unsteady and has some lightheadedness on exam.  Has been using the Rollator and has lost about 30 pounds through his ongoing treatment of leukemia. -Counseled on home exercise therapy and supportive care. -Referral to physical therapy.

## 2020-10-04 NOTE — Patient Instructions (Signed)
Good to see you Let me know about the nutritional supplements that you like  Physical therapy will give you a call.   Please send me a message in MyChart with any questions or updates.  Please see me back in 6-8 weeks.   --Dr. [name]

## 2020-10-04 NOTE — Progress Notes (Signed)
Victor [name] - 84 y.o. male MRN [id number]  Date of birth: 01-Jan-1936  SUBJECTIVE:  Including CC & ROS.  Chief Complaint  Patient presents with  . Follow-up    low back    Victor [name] is a 84 y.o. male that is presenting with right rib pain after a fall on 10/21.  He is also undergoing active treatment for his acute leukemia.  He has weakness and fatigue.  He has been using a Rollator.   Review of Systems See HPI   HISTORY: Past Medical, Surgical, Social, and Family History Reviewed & Updated per EMR.   Pertinent Historical Findings include:  Past Medical History:  Diagnosis Date  . Arthritis   . Atherosclerosis   . Atherosclerotic PVD with intermittent claudication (HCC)    stent left leg  . BPH (benign prostatic hyperplasia)   . Bulging lumbar disc   . Cancer ([place]) 11/12/14   vocal cord  carcinoma in situ , radiation; thyroid cancer  . Carotid bruit   . Chronic kidney disease    chronic stage III  . COPD (chronic obstructive pulmonary disease) ([place])   . DDD (degenerative disc disease), lumbar   . Dysphonia   . Dysplasia of true vocal cord   . GERD (gastroesophageal reflux disease)   . H/O carotid atherosclerosis    b/l  . Hyperlipidemia   . Hypothyroidism   . Occasional tremors    left hand managed with propranolol  . Peripheral vascular angioplasty status with implants and grafts   . Precancerous lesion 03/05/2018   premelanoma removed from back.   . Radiation 03/10/15- 04/18/16   Larynx  . Sleep apnea    hasn't used CPAP in years  . Spondylosis of lumbosacral region   . Thyroid disease     Past Surgical History:  Procedure Laterality Date  . APPENDECTOMY    . COLONOSCOPY W/ POLYPECTOMY    . DIODE LASER APPLICATION Left 49/70/2637   Procedure: DIODE LASER APPLICATION;  Surgeon: Hayden Pedro, MD;  Location: [facility];  Service: Ophthalmology;  Laterality: Left;  . MEMBRANE PEEL Left 08/18/2019   Procedure: MEMBRANE PEEL;  Surgeon: Hayden Pedro, MD;   Location: [facility];  Service: Ophthalmology;  Laterality: Left;  . MOUTH SURGERY     tooth extraction  . PARS PLANA VITRECTOMY Left 08/18/2019   PARS PLANA VITRECTOMY 27 GAUGE (Left)  . PARS PLANA VITRECTOMY 27 GAUGE Left 08/18/2019   Procedure: PARS PLANA VITRECTOMY 27 GAUGE;  Surgeon: Hayden Pedro, MD;  Location: [facility];  Service: Ophthalmology;  Laterality: Left;  . PHOTOCOAGULATION WITH LASER Left 08/18/2019   Procedure: PHOTOCOAGULATION WITH LASER;  Surgeon: Hayden Pedro, MD;  Location: [place];  Service: Ophthalmology;  Laterality: Left;  . THYROID SURGERY    . TONSILLECTOMY    . vocal cord biopsy  11/12/14   squamous cell carcinoma in situ    Family History  Problem Relation Age of Onset  . Colon cancer Father   . Cancer Sister        Died of metastatic cancer  . Leukemia Neg Hx     Social History   Socioeconomic History  . Marital status: Married    Spouse name: Not on file  . Number of children: Not on file  . Years of education: Not on file  . Highest education level: Not on file  Occupational History  . Not on file  Tobacco Use  . Smoking status: Former Smoker  Packs/day: 2.00    Years: 51.00    Pack years: 102.00    Types: Cigarettes    Start date: 14    Quit date: 03/05/2004    Years since quitting: 16.5  . Smokeless tobacco: Never Used  Vaping Use  . Vaping Use: Never used  Substance and Sexual Activity  . Alcohol use: Yes    Alcohol/week: 7.0 standard drinks    Types: 7 Standard drinks or equivalent per week    Comment: 1 drink of rum per day  . Drug use: No  . Sexual activity: Not on file  Other Topics Concern  . Not on file  Social History Narrative  . Not on file   Social Determinants of Health   Financial Resource Strain:   . Difficulty of Paying Living Expenses: Not on file  Food Insecurity:   . Worried About Charity fundraiser in the Last Year: Not on file  . Ran Out of Food in the Last Year: Not on file  Transportation Needs:     . Lack of Transportation (Medical): Not on file  . Lack of Transportation (Non-Medical): Not on file  Physical Activity:   . Days of Exercise per Week: Not on file  . Minutes of Exercise per Session: Not on file  Stress:   . Feeling of Stress : Not on file  Social Connections:   . Frequency of Communication with Friends and Family: Not on file  . Frequency of Social Gatherings with Friends and Family: Not on file  . Attends Religious Services: Not on file  . Active Member of Clubs or Organizations: Not on file  . Attends Archivist Meetings: Not on file  . Marital Status: Not on file  Intimate Partner Violence:   . Fear of Current or Ex-Partner: Not on file  . Emotionally Abused: Not on file  . Physically Abused: Not on file  . Sexually Abused: Not on file     PHYSICAL EXAM:  VS: BP 122/73   Pulse 83   Ht 5\' 9"  (1.753 m)   Wt 190 lb (86.2 kg)   BMI 28.06 kg/m  Physical Exam Gen: NAD, alert, cooperative with exam, well-appearing    ASSESSMENT & PLAN:   Acute leukemia ([place]) Undergoing current treatment.  Has fatigue and has lost about 30 pounds. -Counseled on supportive care. -Provided samples of Ensure and Glucerna.  Can see if we can provide him more if he likes these -Referral to physical therapy.  Closed fracture of one rib of right side Had a fall on 10/21.  He feels unsteady and has some lightheadedness on exam.  Has been using the Rollator and has lost about 30 pounds through his ongoing treatment of leukemia. -Counseled on home exercise therapy and supportive care. -Referral to physical therapy.

## 2020-10-05 ENCOUNTER — Other Ambulatory Visit: Payer: Self-pay | Admitting: *Deleted

## 2020-10-05 DIAGNOSIS — C9500 Acute leukemia of unspecified cell type not having achieved remission: Secondary | ICD-10-CM

## 2020-10-06 ENCOUNTER — Inpatient Hospital Stay: Payer: Medicare Other | Attending: Oncology

## 2020-10-06 ENCOUNTER — Other Ambulatory Visit: Payer: Self-pay

## 2020-10-06 ENCOUNTER — Inpatient Hospital Stay: Payer: Medicare Other

## 2020-10-06 ENCOUNTER — Inpatient Hospital Stay (HOSPITAL_BASED_OUTPATIENT_CLINIC_OR_DEPARTMENT_OTHER): Payer: Medicare Other | Admitting: Oncology

## 2020-10-06 ENCOUNTER — Telehealth: Payer: Self-pay | Admitting: *Deleted

## 2020-10-06 VITALS — BP 129/73 | HR 82 | Temp 97.1°F | Resp 17 | Ht 71.0 in | Wt 185.1 lb

## 2020-10-06 DIAGNOSIS — C9200 Acute myeloblastic leukemia, not having achieved remission: Secondary | ICD-10-CM | POA: Diagnosis not present

## 2020-10-06 DIAGNOSIS — N289 Disorder of kidney and ureter, unspecified: Secondary | ICD-10-CM | POA: Insufficient documentation

## 2020-10-06 DIAGNOSIS — D61810 Antineoplastic chemotherapy induced pancytopenia: Secondary | ICD-10-CM | POA: Insufficient documentation

## 2020-10-06 DIAGNOSIS — N179 Acute kidney failure, unspecified: Secondary | ICD-10-CM | POA: Insufficient documentation

## 2020-10-06 DIAGNOSIS — C9500 Acute leukemia of unspecified cell type not having achieved remission: Secondary | ICD-10-CM | POA: Diagnosis not present

## 2020-10-06 DIAGNOSIS — D709 Neutropenia, unspecified: Secondary | ICD-10-CM | POA: Diagnosis not present

## 2020-10-06 DIAGNOSIS — T451X5A Adverse effect of antineoplastic and immunosuppressive drugs, initial encounter: Secondary | ICD-10-CM | POA: Insufficient documentation

## 2020-10-06 DIAGNOSIS — E8770 Fluid overload, unspecified: Secondary | ICD-10-CM | POA: Insufficient documentation

## 2020-10-06 DIAGNOSIS — Z5111 Encounter for antineoplastic chemotherapy: Secondary | ICD-10-CM | POA: Diagnosis present

## 2020-10-06 DIAGNOSIS — E890 Postprocedural hypothyroidism: Secondary | ICD-10-CM | POA: Insufficient documentation

## 2020-10-06 DIAGNOSIS — Z79899 Other long term (current) drug therapy: Secondary | ICD-10-CM | POA: Diagnosis not present

## 2020-10-06 DIAGNOSIS — Z452 Encounter for adjustment and management of vascular access device: Secondary | ICD-10-CM

## 2020-10-06 LAB — SAMPLE TO BLOOD BANK

## 2020-10-06 LAB — CBC WITH DIFFERENTIAL (CANCER CENTER ONLY)
Abs Immature Granulocytes: 0 K/uL (ref 0.00–0.07)
Basophils Absolute: 0 K/uL (ref 0.0–0.1)
Basophils Relative: 0 %
Eosinophils Absolute: 0 K/uL (ref 0.0–0.5)
Eosinophils Relative: 0 %
HCT: 34 % — ABNORMAL LOW (ref 39.0–52.0)
Hemoglobin: 11 g/dL — ABNORMAL LOW (ref 13.0–17.0)
Immature Granulocytes: 0 %
Lymphocytes Relative: 73 %
Lymphs Abs: 0.6 K/uL — ABNORMAL LOW (ref 0.7–4.0)
MCH: 31.4 pg (ref 26.0–34.0)
MCHC: 32.4 g/dL (ref 30.0–36.0)
MCV: 97.1 fL (ref 80.0–100.0)
Monocytes Absolute: 0 K/uL — ABNORMAL LOW (ref 0.1–1.0)
Monocytes Relative: 4 %
Neutro Abs: 0.2 K/uL — CL (ref 1.7–7.7)
Neutrophils Relative %: 23 %
Platelet Count: 44 K/uL — ABNORMAL LOW (ref 150–400)
RBC: 3.5 MIL/uL — ABNORMAL LOW (ref 4.22–5.81)
RDW: 23.7 % — ABNORMAL HIGH (ref 11.5–15.5)
WBC Count: 0.9 K/uL — CL (ref 4.0–10.5)
nRBC: 0 % (ref 0.0–0.2)

## 2020-10-06 LAB — CMP (CANCER CENTER ONLY)
ALT: 6 U/L (ref 0–44)
AST: 8 U/L — ABNORMAL LOW (ref 15–41)
Albumin: 3.4 g/dL — ABNORMAL LOW (ref 3.5–5.0)
Alkaline Phosphatase: 62 U/L (ref 38–126)
Anion gap: 7 (ref 5–15)
BUN: 13 mg/dL (ref 8–23)
CO2: 24 mmol/L (ref 22–32)
Calcium: 8.6 mg/dL — ABNORMAL LOW (ref 8.9–10.3)
Chloride: 109 mmol/L (ref 98–111)
Creatinine: 1.12 mg/dL (ref 0.61–1.24)
GFR, Estimated: >60 mL/min
Glucose, Bld: 88 mg/dL (ref 70–99)
Potassium: 4.1 mmol/L (ref 3.5–5.1)
Sodium: 140 mmol/L (ref 135–145)
Total Bilirubin: 0.9 mg/dL (ref 0.3–1.2)
Total Protein: 5.7 g/dL — ABNORMAL LOW (ref 6.5–8.1)

## 2020-10-06 LAB — MAGNESIUM: Magnesium: 2.1 mg/dL (ref 1.7–2.4)

## 2020-10-06 MED ORDER — SODIUM CHLORIDE 0.9% FLUSH
10.0000 mL | Freq: Once | INTRAVENOUS | Status: AC
  Administered 2020-10-06: 10 mL
  Filled 2020-10-06: qty 10

## 2020-10-06 MED ORDER — HEPARIN SOD (PORK) LOCK FLUSH 100 UNIT/ML IV SOLN
250.0000 [IU] | Freq: Once | INTRAVENOUS | Status: AC
  Administered 2020-10-06: 250 [IU]
  Filled 2020-10-06: qty 5

## 2020-10-06 NOTE — Patient Instructions (Signed)

## 2020-10-06 NOTE — Progress Notes (Signed)
  [facility] OFFICE PROGRESS NOTE   Diagnosis: AML  INTERVAL HISTORY:   Mr. Levingston began cycle 2 5 azacytidine and venetoclax on 09/19/2020.  No nausea, rash, or diarrhea.  No bleeding or fever.  He reports "dizziness "for the past 5-6 weeks.  He has a "pressure "at the frontal skull.  No vertigo or visual change.  Objective:  Vital signs in last 24 hours:  Blood pressure 129/73, pulse 82, temperature (!) 97.1 F (36.2 C), temperature source Tympanic, resp. rate 17, height $RemoveBe'5\' 11"'[id number]$  (1.803 m), weight 185 lb 1.6 oz (84 kg), SpO2 99 %.    HEENT: No thrush or ulcers.  No mouth bleeding.  No sinus tenderness. Resp: Lungs clear bilaterally Cardio: Regular rate and rhythm GI: No hepatosplenomegaly Vascular: No leg edema Neuro: Alert and oriented   Portacath/PICC-without erythema  Lab Results:  Lab Results  Component Value Date   WBC 0.9 (LL) 10/06/2020   HGB 11.0 (L) 10/06/2020   HCT 34.0 (L) 10/06/2020   MCV 97.1 10/06/2020   PLT 44 (L) 10/06/2020   NEUTROABS PENDING 10/06/2020    CMP  Lab Results  Component Value Date   NA 138 10/03/2020   K 4.6 10/03/2020   CL 107 10/03/2020   CO2 24 10/03/2020   GLUCOSE 103 (H) 10/03/2020   BUN 14 10/03/2020   CREATININE 1.03 10/03/2020   CALCIUM 8.3 (L) 10/03/2020   PROT 5.4 (L) 10/03/2020   ALBUMIN 3.2 (L) 10/03/2020   AST 9 (L) 10/03/2020   ALT 7 10/03/2020   ALKPHOS 58 10/03/2020   BILITOT 1.0 10/03/2020   GFRNONAA >60 10/03/2020   GFRAA 52 (L) 08/06/2020    Medications: I have reviewed the patient's current medications.   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, FLT3-ITD mutation positive  Cycle 1 azacytidine and venetoclax August 09, 2020; venetoclax dose reduced to 200 mg beginning 08/22/2020  Bone marrow biopsy 09/01/2020-no increase in blast cells  Cycle 2 azacitidine and venetoclax September 19, 2020, venetoclax 400 mg daily 21 days, change to 200 mg daily when he began Diflucan/Levaquin prophylaxis  2.Pancytopenia secondary to #1 and chemotherapy 3.Right PICC placed August 08, 2020 4.TTE August 08, 2020-EF 60-65% 5.Fever during hospital admission October 2021, suspicion of pneumonia, treated with cefepime and discharged on Levaquin 6.Hypervolemia with respiratory failure during hospital admission October 2021-diuresis, home Lasix 7.Hypothyroidism secondary to thyroidectomy and radiation-thyroid hormone increased October 2021 8.Acute renal failure October 2021-improved    Disposition:  Mr Earnhart appears well.  He is now at day 18 of cycle 2 5 azacytidine/venetoclax.  He appears to be tolerating the treatment well.  The hemoglobin has improved and the platelets are adequate.  He has persistent severe neutropenia.  We will consult with Dr. Linus Orn regarding the indication to continue venetoclax.  He will return for a lab visit and PICC care on 10/10/2020.  He is scheduled for a follow-up appointment at [facility] on 10/13/2020.  He will return for an office visit with the plan to begin cycle 3 5 azacytidine/venetoclax on 10/17/2020.  The etiology of the "dizziness "is unclear.  He will drink plenty of fluid and stand up slowly.  He is using a walker to ambulate.  Betsy Coder, MD  10/06/2020  11:22 AM

## 2020-10-06 NOTE — Telephone Encounter (Signed)
Faxed today's labs to [facility] at [phone] and called critical results to [place]. Inquired if he should stop the venetoclax now or continue till last dose on Sunday? Also when will next cycle be due? Per Cecille Rubin, MD said continue 200 mg/day of venetoclax till Sunday as scheduled and next cycle will be determined at visit next week at [facility].  Patient/wife were called and notified.

## 2020-10-07 ENCOUNTER — Telehealth: Payer: Self-pay | Admitting: Oncology

## 2020-10-07 NOTE — Telephone Encounter (Signed)
Scheduled appointments per 12/2 los. Spoke to patient who is aware of appointments dates and times. Will have updated calendar printed for patient at next visit.

## 2020-10-10 ENCOUNTER — Inpatient Hospital Stay: Payer: Medicare Other

## 2020-10-10 ENCOUNTER — Other Ambulatory Visit: Payer: Self-pay | Admitting: *Deleted

## 2020-10-10 ENCOUNTER — Telehealth: Payer: Self-pay | Admitting: *Deleted

## 2020-10-10 ENCOUNTER — Other Ambulatory Visit: Payer: Self-pay

## 2020-10-10 VITALS — BP 118/59 | HR 83 | Temp 97.9°F | Resp 18

## 2020-10-10 DIAGNOSIS — C9200 Acute myeloblastic leukemia, not having achieved remission: Secondary | ICD-10-CM | POA: Diagnosis not present

## 2020-10-10 DIAGNOSIS — C9500 Acute leukemia of unspecified cell type not having achieved remission: Secondary | ICD-10-CM

## 2020-10-10 DIAGNOSIS — Z452 Encounter for adjustment and management of vascular access device: Secondary | ICD-10-CM

## 2020-10-10 LAB — CBC WITH DIFFERENTIAL (CANCER CENTER ONLY)
Abs Immature Granulocytes: 0.01 10*3/uL (ref 0.00–0.07)
Basophils Absolute: 0 10*3/uL (ref 0.0–0.1)
Basophils Relative: 1 %
Eosinophils Absolute: 0 10*3/uL (ref 0.0–0.5)
Eosinophils Relative: 0 %
HCT: 31.6 % — ABNORMAL LOW (ref 39.0–52.0)
Hemoglobin: 10.5 g/dL — ABNORMAL LOW (ref 13.0–17.0)
Immature Granulocytes: 1 %
Lymphocytes Relative: 82 %
Lymphs Abs: 0.7 10*3/uL (ref 0.7–4.0)
MCH: 32.1 pg (ref 26.0–34.0)
MCHC: 33.2 g/dL (ref 30.0–36.0)
MCV: 96.6 fL (ref 80.0–100.0)
Monocytes Absolute: 0 10*3/uL — ABNORMAL LOW (ref 0.1–1.0)
Monocytes Relative: 3 %
Neutro Abs: 0.1 10*3/uL — CL (ref 1.7–7.7)
Neutrophils Relative %: 13 %
Platelet Count: 54 10*3/uL — ABNORMAL LOW (ref 150–400)
RBC: 3.27 MIL/uL — ABNORMAL LOW (ref 4.22–5.81)
RDW: 23.8 % — ABNORMAL HIGH (ref 11.5–15.5)
WBC Count: 0.8 10*3/uL — CL (ref 4.0–10.5)
nRBC: 0 % (ref 0.0–0.2)

## 2020-10-10 LAB — CMP (CANCER CENTER ONLY)
ALT: 6 U/L (ref 0–44)
AST: 10 U/L — ABNORMAL LOW (ref 15–41)
Albumin: 3.2 g/dL — ABNORMAL LOW (ref 3.5–5.0)
Alkaline Phosphatase: 68 U/L (ref 38–126)
Anion gap: 7 (ref 5–15)
BUN: 14 mg/dL (ref 8–23)
CO2: 26 mmol/L (ref 22–32)
Calcium: 8.4 mg/dL — ABNORMAL LOW (ref 8.9–10.3)
Chloride: 107 mmol/L (ref 98–111)
Creatinine: 1.07 mg/dL (ref 0.61–1.24)
GFR, Estimated: >60 mL/min (ref >60)
Glucose, Bld: 111 mg/dL — ABNORMAL HIGH (ref 70–99)
Potassium: 4.1 mmol/L (ref 3.5–5.1)
Sodium: 140 mmol/L (ref 135–145)
Total Bilirubin: 0.9 mg/dL (ref 0.3–1.2)
Total Protein: 5.6 g/dL — ABNORMAL LOW (ref 6.5–8.1)

## 2020-10-10 LAB — MAGNESIUM: Magnesium: 2 mg/dL (ref 1.7–2.4)

## 2020-10-10 MED ORDER — HEPARIN SOD (PORK) LOCK FLUSH 100 UNIT/ML IV SOLN
250.0000 [IU] | Freq: Once | INTRAVENOUS | Status: AC
  Administered 2020-10-10: 250 [IU]
  Filled 2020-10-10: qty 5

## 2020-10-10 MED ORDER — SODIUM CHLORIDE 0.9% FLUSH
10.0000 mL | Freq: Once | INTRAVENOUS | Status: AC
  Administered 2020-10-10: 10 mL
  Filled 2020-10-10: qty 10

## 2020-10-10 NOTE — Patient Instructions (Signed)
PICC Home Care Guide  A peripherally inserted central catheter (PICC) is a form of IV access that allows medicines and IV fluids to be quickly distributed throughout the body. The PICC is a long, thin, flexible tube (catheter) that is inserted into a vein in the upper arm. The catheter ends in a large vein in the chest (superior vena cava, or SVC). After the PICC is inserted, a chest X-ray may be done to make sure that it is in the correct place. A PICC may be placed for different reasons, such as:  To give medicines and liquid nutrition.  To give IV fluids and blood products.  If there is trouble placing a peripheral intravenous (PIV) catheter. If taken care of properly, a PICC can remain in place for several months. Having a PICC can also allow a person to go home from the hospital sooner. Medicine and PICC care can be managed at home by a family member, caregiver, or home health care team. What are the risks? Generally, having a PICC is safe. However, problems may occur, including:  A blood clot (thrombus) forming in or at the tip of the PICC.  A blood clot forming in a vein (deep vein thrombosis) or traveling to the lung (pulmonary embolism).  Inflammation of the vein (phlebitis) in which the PICC is placed.  Infection. Central line associated blood stream infection (CLABSI) is a serious infection that often requires hospitalization.  PICC movement (malposition). The PICC tip may move from its original position due to excessive physical activity, forceful coughing, sneezing, or vomiting.  A break or cut in the PICC. It is important not to use scissors near the PICC.  Nerve or tendon irritation or injury during PICC insertion. How to take care of your PICC Preventing problems  You and any caregivers should wash your hands often with soap. Wash hands: ? Before touching the PICC line or the infusion device. ? Before changing a bandage (dressing).  Flush the PICC as told by your  health care provider. Let your health care provider know right away if the PICC is hard to flush or does not flush. Do not use force to flush the PICC.  Do not use a syringe that is less than 10 mL to flush the PICC.  Avoid blood pressure checks on the arm in which the PICC is placed.  Never pull or tug on the PICC.  Do not take the PICC out yourself. Only a trained clinical professional should remove the PICC.  Use clean and sterile supplies only. Keep the supplies in a dry place. Do not reuse needles, syringes, or any other supplies. Doing that can lead to infection.  Keep pets and children away from your PICC line.  Check the PICC insertion site every day for signs of infection. Check for: ? Leakage. ? Redness, swelling, or pain. ? Fluid or blood. ? Warmth. ? Pus or a bad smell. PICC dressing care  Keep your PICC bandage (dressing) clean and dry to prevent infection.  Do not take baths, swim, or use a hot tub until your health care provider approves. Ask your health care provider if you can take showers. You may only be allowed to take sponge baths for bathing. When you are allowed to shower: ? Ask your health care provider to teach you how to wrap the PICC line. ? Cover the PICC line with clear plastic wrap and tape to keep it dry while showering.  Follow instructions from your health care provider   about how to take care of your insertion site and dressing. Make sure you: ? Wash your hands with soap and water before you change your bandage (dressing). If soap and water are not available, use hand sanitizer. ? Change your dressing as told by your health care provider. ? Leave stitches (sutures), skin glue, or adhesive strips in place. These skin closures may need to stay in place for 2 weeks or longer. If adhesive strip edges start to loosen and curl up, you may trim the loose edges. Do not remove adhesive strips completely unless your health care provider tells you to do  that.  Change your PICC dressing if it becomes loose or wet. General instructions   Carry your PICC identification card or wear a medical alert bracelet at all times.  Keep the tube clamped at all times, unless it is being used.  Carry a smooth-edge clamp with you at all times to place on the tube if it breaks.  Do not use scissors or sharp objects near the tube.  You may bend your arm and move it freely. If your PICC is near or at the bend of your elbow, avoid activity with repeated motion at the elbow.  Avoid lifting heavy objects as told by your health care provider.  Keep all follow-up visits as told by your health care provider. This is important. Disposal of supplies  Throw away any syringes in a disposal container that is meant for sharp items (sharps container). You can buy a sharps container from a pharmacy, or you can make one by using an empty hard plastic bottle with a cover.  Place any used dressings or infusion bags into a plastic bag. Throw that bag in the trash. Contact a health care provider if:  You have pain in your arm, ear, face, or teeth.  You have a fever or chills.  You have redness, swelling, or pain around the insertion site.  You have fluid or blood coming from the insertion site.  Your insertion site feels warm to the touch.  You have pus or a bad smell coming from the insertion site.  Your skin feels hard and raised around the insertion site. Get help right away if:  Your PICC is accidentally pulled all the way out. If this happens, cover the insertion site with a bandage or gauze dressing. Do not throw the PICC away. Your health care provider will need to check it.  Your PICC was tugged or pulled and has partially come out. Do not  push the PICC back in.  You cannot flush the PICC, it is hard to flush, or the PICC leaks around the insertion site when it is flushed.  You hear a "flushing" sound when the PICC is flushed.  You feel your  heart racing or skipping beats.  There is a hole or tear in the PICC.  You have swelling in the arm in which the PICC was inserted.  You have a red streak going up your arm from where the PICC was inserted. Summary  A peripherally inserted central catheter (PICC) is a long, thin, flexible tube (catheter) that is inserted into a vein in the upper arm.  The PICC is inserted using a sterile technique by a specially trained nurse or physician. Only a trained clinical professional should remove it.  Keep your PICC identification card with you at all times.  Avoid blood pressure checks on the arm in which the PICC is placed.  If cared for   properly, a PICC can remain in place for several months. Having a PICC can also allow a person to go home from the hospital sooner. This information is not intended to replace advice given to you by your health care provider. Make sure you discuss any questions you have with your health care provider. Document Revised: 10/04/2017 Document Reviewed: 11/24/2016 Elsevier Patient Education  2020 [employer]  

## 2020-10-10 NOTE — Telephone Encounter (Signed)
CRITICAL VALUE STICKER  CRITICAL VALUE: WBC 0.8 and ANC 0.1  RECEIVER (on-site recipient of call): Stephani Janak,RN  DATE & TIME NOTIFIED: 10/10/20 @ 1123  MESSENGER (representative from lab): [name]  MD NOTIFIED: Benay Spice  TIME OF NOTIFICATION: 9643  RESPONSE: Stay on antibiotics, call for fever or s/s infection. F/U with [facility] on 12/9 as scheduled.  Patient made aware of these and other results today and called results to Debroah Baller at [facility] and faxed results as well.

## 2020-10-11 ENCOUNTER — Ambulatory Visit: Payer: Medicare Other | Admitting: Physical Therapy

## 2020-10-11 ENCOUNTER — Encounter: Payer: Self-pay | Admitting: Physical Therapy

## 2020-10-11 ENCOUNTER — Ambulatory Visit: Payer: Medicare Other | Attending: Orthopedic Surgery | Admitting: Physical Therapy

## 2020-10-11 DIAGNOSIS — R2689 Other abnormalities of gait and mobility: Secondary | ICD-10-CM | POA: Insufficient documentation

## 2020-10-11 DIAGNOSIS — M6281 Muscle weakness (generalized): Secondary | ICD-10-CM | POA: Diagnosis present

## 2020-10-11 DIAGNOSIS — M25552 Pain in left hip: Secondary | ICD-10-CM | POA: Insufficient documentation

## 2020-10-11 DIAGNOSIS — R296 Repeated falls: Secondary | ICD-10-CM | POA: Diagnosis present

## 2020-10-11 DIAGNOSIS — M25551 Pain in right hip: Secondary | ICD-10-CM | POA: Insufficient documentation

## 2020-10-11 NOTE — Therapy (Signed)
[place]. [place], [place], [address] Phone: [phone]   Fax:  [phone]  Physical Therapy Evaluation  Patient Details  Name: Victor [name] MRN: [id number] Date of Birth: [birth year] Referring Provider (PT): Clinton Quant Date: 10/11/2020   PT End of Session - 10/11/20 1559    Visit Number 1    Number of Visits 16    Date for PT Re-Evaluation 12/12/20    Authorization Type BCBS    PT Start Time 1523    PT Stop Time 1613    PT Time Calculation (min) 50 min    Activity Tolerance Patient tolerated treatment well    Behavior During Therapy [facility] for tasks assessed/performed           Past Medical History:  Diagnosis Date  . Arthritis   . Atherosclerosis   . Atherosclerotic PVD with intermittent claudication (HCC)    stent left leg  . BPH (benign prostatic hyperplasia)   . Bulging lumbar disc   . Cancer ([place]) 11/12/14   vocal cord  carcinoma in situ , radiation; thyroid cancer  . Carotid bruit   . Chronic kidney disease    chronic stage III  . COPD (chronic obstructive pulmonary disease) ([place])   . DDD (degenerative disc disease), lumbar   . Dysphonia   . Dysplasia of true vocal cord   . GERD (gastroesophageal reflux disease)   . H/O carotid atherosclerosis    b/l  . Hyperlipidemia   . Hypothyroidism   . Occasional tremors    left hand managed with propranolol  . Peripheral vascular angioplasty status with implants and grafts   . Precancerous lesion 03/05/2018   premelanoma removed from back.   . Radiation 03/10/15- 04/18/16   Larynx  . Sleep apnea    hasn't used CPAP in years  . Spondylosis of lumbosacral region   . Thyroid disease     Past Surgical History:  Procedure Laterality Date  . APPENDECTOMY    . COLONOSCOPY W/ POLYPECTOMY    . DIODE LASER APPLICATION Left 78/93/8101   Procedure: DIODE LASER APPLICATION;  Surgeon: Hayden Pedro, MD;  Location: [place];  Service:  Ophthalmology;  Laterality: Left;  . MEMBRANE PEEL Left 08/18/2019   Procedure: MEMBRANE PEEL;  Surgeon: Hayden Pedro, MD;  Location: [facility] [place];  Service: Ophthalmology;  Laterality: Left;  . MOUTH SURGERY     tooth extraction  . PARS PLANA VITRECTOMY Left 08/18/2019   PARS PLANA VITRECTOMY 27 GAUGE (Left)  . PARS PLANA VITRECTOMY 27 GAUGE Left 08/18/2019   Procedure: PARS PLANA VITRECTOMY 27 GAUGE;  Surgeon: Hayden Pedro, MD;  Location: [facility];  Service: Ophthalmology;  Laterality: Left;  . PHOTOCOAGULATION WITH LASER Left 08/18/2019   Procedure: PHOTOCOAGULATION WITH LASER;  Surgeon: Hayden Pedro, MD;  Location: [facility];  Service: Ophthalmology;  Laterality: Left;  . THYROID SURGERY    . TONSILLECTOMY    . vocal cord biopsy  11/12/14   squamous cell carcinoma in situ    There were no vitals filed for this visit.    Subjective Assessment - 10/11/20 1528    Subjective Patient reports that diagnosed with leukemia recently, reports that he has been getting treatment, he reports one fall with 2 rib fractures on the right side.  He reports he has daily diziness, reports he is weaker all over with just sitting around since September 30th.  He reports that he had an 11 day  hospital stay in early October    Limitations Lifting;Standing;Walking;House hold activities    Diagnostic tests MR - mild hip OA B, right rib fx    Patient Stated Goals get stronger, have no falls, feel safer and better, less rib pain    Currently in Pain? Yes    Pain Score 4     Pain Location Rib cage    Pain Orientation Right    Pain Descriptors / Indicators Aching;Sore    Pain Type Acute pain    Pain Onset More than a month ago    Pain Frequency Constant    Aggravating Factors  cannot lie on right side, cannot sleep in bed, pain can be up to 8/10    Pain Relieving Factors being still rest, pain can be 3/10    Effect of Pain on Daily Activities limits sleep, and other activities              [facility] PT  Assessment - 10/11/20 0001      Assessment   Medical Diagnosis weakness, falls, rib pain    Referring Provider (PT) [name]    Onset Date/Surgical Date 09/27/20    Prior Therapy for hip issues      Precautions   Precaution Comments undergoing chemo for leukemia      Balance Screen   Has the patient fallen in the past 6 months Yes    How many times? 1    Has the patient had a decrease in activity level because of a fear of falling?  Yes    Is the patient reluctant to leave their home because of a fear of falling?  No      Home Environment   Additional Comments light housework      Prior Function   Level of Independence Independent    Vocation Retired    Leisure Read, walking      AROM   Overall AROM Comments knee and hip ROM WFL's, lumbar ROM decreased 50% with some c/o right rib pain      Strength   Overall Strength Comments knees 4/5, ankles 4/5, UE strength 4-/5    Right Hip Flexion 3+/5    Right Hip ABduction 4-/5    Left Hip Flexion 4-/5    Left Hip ABduction 4-/5      Flexibility   Soft Tissue Assessment /Muscle Length yes    Hamstrings very tight    Quadriceps very tight    Piriformis very tight      Palpation   Palpation comment pain and tenderness right posterior rib area still some ecchymosis      Ambulation/Gait   Gait Comments uses a 4 wheeled walker for ambulation, reports feeling unsteady with turns      Standardized Balance Assessment   Standardized Balance Assessment Timed Up and Go Test;Berg Balance Test      Berg Balance Test   Sit to Stand Able to stand  independently using hands    Standing Unsupported Able to stand safely 2 minutes    Sitting with Back Unsupported but Feet Supported on Floor or Stool Able to sit safely and securely 2 minutes    Stand to Sit Sits safely with minimal use of hands    Transfers Able to transfer safely, minor use of hands    Standing Unsupported with Eyes Closed Able to stand 10 seconds safely    Standing  Unsupported with Feet Together Able to place feet together independently and stand 1 minute safely  From Standing, Reach Forward with Outstretched Arm Can reach confidently >25 cm (10")    From Standing Position, Pick up Object from [place] to pick up shoe safely and easily    From Standing Position, Turn to Look Behind Over each Shoulder Turn sideways only but maintains balance    Turn 360 Degrees Able to turn 360 degrees safely one side only in 4 seconds or less    Standing Unsupported, Alternately Place Feet on Step/Stool Able to stand independently and complete 8 steps >20 seconds    Standing Unsupported, One Foot in Front Needs help to step but can hold 15 seconds    Standing on One Leg Able to lift leg independently and hold equal to or more than 3 seconds    Total Score 46      Timed Up and Go Test   Normal TUG (seconds) 25    TUG Comments with FWW, reports dizziness when standing                      Objective measurements completed on examination: See above findings.       [facility] Adult PT Treatment/Exercise - 10/11/20 0001      Knee/Hip Exercises: Aerobic   Nustep level 4 x 5 minutes                    PT Short Term Goals - 10/11/20 1707      PT SHORT TERM GOAL #1   Title Pt will be independent and compliant with initial HEP.    Time 2    Period Weeks    Status New             PT Long Term Goals - 10/11/20 1707      PT LONG TERM GOAL #1   Title incresae hip strength ti 4/5    Time 8    Period Weeks    Status New      PT LONG TERM GOAL #2   Title increase UE strength to 4+/5    Time 8    Period Weeks    Status New      PT LONG TERM GOAL #3   Title decrease TUG time to 14 seconds    Time 8    Period Weeks    Status New      PT LONG TERM GOAL #4   Title improve berg balance test score to 50/56    Time 8    Period Weeks    Status New      PT LONG TERM GOAL #5   Title understand safety and fall risks at home    Time 8     Period Weeks    Status New                  Plan - 10/11/20 1600    Clinical Impression Statement Patient reports an 11 day hospital stay at the beginning of October.  He was diagnosed with leukemia.  He reports that he has lost 30#.  He has had a fall since he came home and reports that he ahs had more unsteadiness and feels very weak.  His hips and arms are weak.  He is using a walker to walk, had TUG of 25 seconds and Berg of 46/56 putting him at a high risk for falls    Personal Factors and Comorbidities Age;Comorbidity 1;Comorbidity 2;Comorbidity 3+    Comorbidities HLD, COPD, kidney dz,  leukemia    Stability/Clinical Decision Making Evolving/Moderate complexity    Clinical Decision Making Moderate    Rehab Potential Good    PT Frequency 2x / week    PT Duration 8 weeks    PT Treatment/Interventions ADLs/Self Care Home Management;Joint Manipulations;Dry needling;Manual techniques;Neuromuscular re-education;Therapeutic activities;Therapeutic exercise;Balance training;Passive range of motion;Vasopneumatic Device;Patient/family education    PT Next Visit Plan Will work on strength, balance and function    Consulted and Agree with Plan of Care Patient           Patient will benefit from skilled therapeutic intervention in order to improve the following deficits and impairments:  Decreased balance, Decreased mobility, Decreased strength, Postural dysfunction, Improper body mechanics, Impaired flexibility, Hypomobility, Decreased range of motion, Increased fascial restricitons, Dizziness, Decreased endurance, Cardiopulmonary status limiting activity, Decreased activity tolerance  Visit Diagnosis: Muscle weakness (generalized) - Plan: PT plan of care cert/re-cert  Other abnormalities of gait and mobility - Plan: PT plan of care cert/re-cert  Repeated falls - Plan: PT plan of care cert/re-cert     Problem List Patient Active Problem List   Diagnosis Date Noted  . Closed  fracture of one rib of right side 10/04/2020  . Goals of care, counseling/discussion 09/05/2020  . PICC (peripherally inserted central catheter) in place 08/22/2020  . 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 Thrombocytopenia ([place]) 08/05/2020  . Anemia 08/05/2020  . AKI (acute kidney injury) ([place]) 08/05/2020  . Hematologic malignancy ([place]) 08/05/2020  . Acute leukemia ([place]) 08/05/2020  . Hypothyroidism 08/05/2020  . Malaise and fatigue 08/04/2020  . Obesity (BMI 30-39.9) 08/04/2020  . COPD, moderate ([place]) 05/24/2020  . Bilateral hip pain 05/04/2020  . Plantar fasciitis of left foot 03/30/2020  . Chronic left SI joint pain 03/01/2020  . Shortness of breath 10/01/2019  . Former smoker 10/01/2019  . Healthcare maintenance 10/01/2019  . Epiretinal membrane (ERM) of left eye 07/22/2019  . Centrilobular emphysema ([place]) 07/02/2019  . Pharyngeal dysphagia 04/07/2019  . Cervical radiculopathy 03/16/2019  . 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 Lumbar radiculopathy 04/16/2017  . Rotator cuff syndrome of left shoulder 04/16/2017  . Trochanteric bursitis, left hip 04/16/2017  . Atherosclerosis of artery of both lower extremities ([place]) 11/14/2016  . Bilateral carotid artery stenosis 11/14/2016  . Vitamin D deficiency 07/31/2016  . Facet arthritis of lumbar region 09/26/2015  . 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  . History of thyroid cancer 03/26/2014  . Benign prostatic hyperplasia with urinary obstruction 03/23/2014  . Dysphonia 03/17/2014  . Gastro-esophageal reflux disease without esophagitis 03/17/2014  . Hyperlipidemia 03/17/2014  . Other intervertebral disc degeneration, lumbar region 03/17/2014  . PAD (peripheral artery disease) ([place]) 03/17/2014  . Postoperative hypothyroidism 03/17/2014  . Chronic kidney disease, stage 2 (mild) 03/17/2014  . Occasional tremors 03/17/2014    Sumner Boast., PT 10/11/2020, 5:11 PM  [place]. [place], [place], [address] Phone: [phone]   Fax:  [phone]  Name: AUNDRE HIETALA MRN: [id number] Date of Birth: [birth year]

## 2020-10-14 ENCOUNTER — Ambulatory Visit: Payer: Medicare Other | Admitting: Physical Therapy

## 2020-10-14 ENCOUNTER — Telehealth: Payer: Self-pay | Admitting: *Deleted

## 2020-10-14 ENCOUNTER — Other Ambulatory Visit: Payer: Self-pay

## 2020-10-14 ENCOUNTER — Encounter: Payer: Self-pay | Admitting: Physical Therapy

## 2020-10-14 DIAGNOSIS — M6281 Muscle weakness (generalized): Secondary | ICD-10-CM

## 2020-10-14 DIAGNOSIS — R296 Repeated falls: Secondary | ICD-10-CM

## 2020-10-14 DIAGNOSIS — R2689 Other abnormalities of gait and mobility: Secondary | ICD-10-CM

## 2020-10-14 NOTE — Therapy (Signed)
[facility]. [place], [place], [address] Phone: [phone]   Fax:  [phone]  Physical Therapy Treatment  Patient Details  Name: Victor [name] MRN: [id number] Date of Birth: 1936-04-21 Referring Provider (PT): Clinton Quant Date: 10/14/2020   PT End of Session - 10/14/20 1142    Visit Number 2    Number of Visits 16    Date for PT Re-Evaluation 12/12/20    Authorization Type BCBS    PT Start Time 1058    PT Stop Time 1142    PT Time Calculation (min) 44 min    Activity Tolerance Patient tolerated treatment well    Behavior During Therapy [facility] for tasks assessed/performed           Past Medical History:  Diagnosis Date  . Arthritis   . Atherosclerosis   . Atherosclerotic PVD with intermittent claudication (HCC)    stent left leg  . BPH (benign prostatic hyperplasia)   . Bulging lumbar disc   . Cancer ([place]) 11/12/14   vocal cord  carcinoma in situ , radiation; thyroid cancer  . Carotid bruit   . Chronic kidney disease    chronic stage III  . COPD (chronic obstructive pulmonary disease) ([place])   . DDD (degenerative disc disease), lumbar   . Dysphonia   . Dysplasia of true vocal cord   . GERD (gastroesophageal reflux disease)   . H/O carotid atherosclerosis    b/l  . Hyperlipidemia   . Hypothyroidism   . Occasional tremors    left hand managed with propranolol  . Peripheral vascular angioplasty status with implants and grafts   . Precancerous lesion 03/05/2018   premelanoma removed from back.   . Radiation 03/10/15- 04/18/16   Larynx  . Sleep apnea    hasn't used CPAP in years  . Spondylosis of lumbosacral region   . Thyroid disease     Past Surgical History:  Procedure Laterality Date  . APPENDECTOMY    . COLONOSCOPY W/ POLYPECTOMY    . DIODE LASER APPLICATION Left 62/56/3893   Procedure: DIODE LASER APPLICATION;  Surgeon: Hayden Pedro, MD;  Location: [facility];  Service:  Ophthalmology;  Laterality: Left;  . MEMBRANE PEEL Left 08/18/2019   Procedure: MEMBRANE PEEL;  Surgeon: Hayden Pedro, MD;  Location: [facility];  Service: Ophthalmology;  Laterality: Left;  . MOUTH SURGERY     tooth extraction  . PARS PLANA VITRECTOMY Left 08/18/2019   PARS PLANA VITRECTOMY 27 GAUGE (Left)  . PARS PLANA VITRECTOMY 27 GAUGE Left 08/18/2019   Procedure: PARS PLANA VITRECTOMY 27 GAUGE;  Surgeon: Hayden Pedro, MD;  Location: [facility];  Service: Ophthalmology;  Laterality: Left;  . PHOTOCOAGULATION WITH LASER Left 08/18/2019   Procedure: PHOTOCOAGULATION WITH LASER;  Surgeon: Hayden Pedro, MD;  Location: [facility];  Service: Ophthalmology;  Laterality: Left;  . THYROID SURGERY    . TONSILLECTOMY    . vocal cord biopsy  11/12/14   squamous cell carcinoma in situ    There were no vitals filed for this visit.   Subjective Assessment - 10/14/20 1101    Subjective Reports no problems, left walker in the car today    Currently in Pain? Yes    Pain Score 3     Pain Location Rib cage    Pain Orientation Right  [facility] Adult PT Treatment/Exercise - 10/14/20 0001      High Level Balance   High Level Balance Comments on airex ball toss, airex balance beam walking tandem and side stepping, stepping over objects and onto dynamic surfaces forward and backward      Knee/Hip Exercises: Aerobic   Recumbent Bike level 2 x 6      Knee/Hip Exercises: Machines for Strengthening   Cybex Knee Extension 10# 2x10    Cybex Knee Flexion 25# 2x10      Knee/Hip Exercises: Standing   Hip Flexion Both;2 sets;10 reps    Hip Flexion Limitations 3#    Hip Abduction Both;2 sets;10 reps    Abduction Limitations 3#    Hip Extension Both;2 sets;10 reps    Extension Limitations 3#      Knee/Hip Exercises: Seated   Other Seated Knee/Hip Exercises ankle eversion with yellow tband                    PT Short Term Goals - 10/11/20 1707       PT SHORT TERM GOAL #1   Title Pt will be independent and compliant with initial HEP.    Time 2    Period Weeks    Status New             PT Long Term Goals - 10/11/20 1707      PT LONG TERM GOAL #1   Title incresae hip strength ti 4/5    Time 8    Period Weeks    Status New      PT LONG TERM GOAL #2   Title increase UE strength to 4+/5    Time 8    Period Weeks    Status New      PT LONG TERM GOAL #3   Title decrease TUG time to 14 seconds    Time 8    Period Weeks    Status New      PT LONG TERM GOAL #4   Title improve berg balance test score to 50/56    Time 8    Period Weeks    Status New      PT LONG TERM GOAL #5   Title understand safety and fall risks at home    Time 8    Period Weeks    Status New                 Plan - 10/14/20 1143    Clinical Impression Statement PAtietn tolerated the exercises well, however with the dynamic surface activities he struggled with balance, requiring step reaction a few times to catch himself, he had very weak ankles and they tended to roll laterally, especially on the dynamic surfaces, I examined his shoes and the outsides are very worn from how he walks, I tested the ankle eversion and they are very weak, gave him yellow tband to do this at home, needed cues to assure the right ankle was working    PT Next Visit Plan Will work on strength, balance and function    Consulted and Agree with Plan of Care Patient           Patient will benefit from skilled therapeutic intervention in order to improve the following deficits and impairments:  Decreased balance,Decreased mobility,Decreased strength,Postural dysfunction,Improper body mechanics,Impaired flexibility,Hypomobility,Decreased range of motion,Increased fascial restricitons,Dizziness,Decreased endurance,Cardiopulmonary status limiting activity,Decreased activity tolerance  Visit Diagnosis: Muscle weakness (generalized)  Other abnormalities of gait and  mobility  Repeated falls     Problem List Patient Active Problem List   Diagnosis Date Noted  . Closed fracture of one rib of right side 10/04/2020  . Goals of care, counseling/discussion 09/05/2020  . PICC (peripherally inserted central catheter) in place 08/22/2020  . 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 Thrombocytopenia ([place]) 08/05/2020  . Anemia 08/05/2020  . AKI (acute kidney injury) ([place]) 08/05/2020  . Hematologic malignancy ([place]) 08/05/2020  . Acute leukemia ([place]) 08/05/2020  . Hypothyroidism 08/05/2020  . Malaise and fatigue 08/04/2020  . Obesity (BMI 30-39.9) 08/04/2020  . COPD, moderate ([place]) 05/24/2020  . Bilateral hip pain 05/04/2020  . Plantar fasciitis of left foot 03/30/2020  . Chronic left SI joint pain 03/01/2020  . Shortness of breath 10/01/2019  . Former smoker 10/01/2019  . Healthcare maintenance 10/01/2019  . Epiretinal membrane (ERM) of left eye 07/22/2019  . Centrilobular emphysema ([place]) 07/02/2019  . Pharyngeal dysphagia 04/07/2019  . Cervical radiculopathy 03/16/2019  . 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 Lumbar radiculopathy 04/16/2017  . Rotator cuff syndrome of left shoulder 04/16/2017  . Trochanteric bursitis, left hip 04/16/2017  . Atherosclerosis of artery of both lower extremities ([place]) 11/14/2016  . Bilateral carotid artery stenosis 11/14/2016  . Vitamin D deficiency 07/31/2016  . Facet arthritis of lumbar region 09/26/2015  . 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  . History of thyroid cancer 03/26/2014  . Benign prostatic hyperplasia with urinary obstruction 03/23/2014  . Dysphonia 03/17/2014  . Gastro-esophageal reflux disease without esophagitis 03/17/2014  . Hyperlipidemia 03/17/2014  . Other intervertebral disc degeneration, lumbar region 03/17/2014  . PAD (peripheral artery disease) ([place]) 03/17/2014  . Postoperative hypothyroidism 03/17/2014  . Chronic kidney disease, stage 2 (mild) 03/17/2014  . Occasional tremors  03/17/2014    Sumner Boast., PT 10/14/2020, 11:45 AM  [place]. [place], [place], [address] Phone: [phone]   Fax:  [phone]  Name: Victor [name] MRN: [id number] Date of Birth: 1936/08/25

## 2020-10-14 NOTE — Telephone Encounter (Signed)
Called to report that Dr. Linus Orn wants to delay the start of his next cycle of Venetoclax/Vidaza to 10/31/20. Dr. Benay Spice notified.

## 2020-10-16 ENCOUNTER — Other Ambulatory Visit: Payer: Self-pay | Admitting: Oncology

## 2020-10-17 ENCOUNTER — Other Ambulatory Visit: Payer: Self-pay

## 2020-10-17 ENCOUNTER — Telehealth: Payer: Self-pay | Admitting: *Deleted

## 2020-10-17 ENCOUNTER — Inpatient Hospital Stay: Payer: Medicare Other

## 2020-10-17 ENCOUNTER — Inpatient Hospital Stay (HOSPITAL_BASED_OUTPATIENT_CLINIC_OR_DEPARTMENT_OTHER): Payer: Medicare Other | Admitting: Nurse Practitioner

## 2020-10-17 ENCOUNTER — Encounter: Payer: Self-pay | Admitting: Nurse Practitioner

## 2020-10-17 VITALS — BP 131/64 | HR 77 | Temp 97.5°F | Resp 16 | Ht 71.0 in | Wt 188.3 lb

## 2020-10-17 DIAGNOSIS — C9500 Acute leukemia of unspecified cell type not having achieved remission: Secondary | ICD-10-CM

## 2020-10-17 DIAGNOSIS — Z452 Encounter for adjustment and management of vascular access device: Secondary | ICD-10-CM

## 2020-10-17 DIAGNOSIS — C9200 Acute myeloblastic leukemia, not having achieved remission: Secondary | ICD-10-CM | POA: Diagnosis not present

## 2020-10-17 LAB — CMP (CANCER CENTER ONLY)
ALT: 7 U/L (ref 0–44)
AST: 10 U/L — ABNORMAL LOW (ref 15–41)
Albumin: 3.3 g/dL — ABNORMAL LOW (ref 3.5–5.0)
Alkaline Phosphatase: 75 U/L (ref 38–126)
Anion gap: 5 (ref 5–15)
BUN: 14 mg/dL (ref 8–23)
CO2: 27 mmol/L (ref 22–32)
Calcium: 8.4 mg/dL — ABNORMAL LOW (ref 8.9–10.3)
Chloride: 108 mmol/L (ref 98–111)
Creatinine: 0.98 mg/dL (ref 0.61–1.24)
GFR, Estimated: >60 mL/min (ref >60)
Glucose, Bld: 101 mg/dL — ABNORMAL HIGH (ref 70–99)
Potassium: 4.1 mmol/L (ref 3.5–5.1)
Sodium: 140 mmol/L (ref 135–145)
Total Bilirubin: 0.6 mg/dL (ref 0.3–1.2)
Total Protein: 5.6 g/dL — ABNORMAL LOW (ref 6.5–8.1)

## 2020-10-17 LAB — CBC WITH DIFFERENTIAL (CANCER CENTER ONLY)
Abs Immature Granulocytes: 0 10*3/uL (ref 0.00–0.07)
Basophils Absolute: 0 10*3/uL (ref 0.0–0.1)
Basophils Relative: 0 %
Eosinophils Absolute: 0 10*3/uL (ref 0.0–0.5)
Eosinophils Relative: 0 %
HCT: 33.8 % — ABNORMAL LOW (ref 39.0–52.0)
Hemoglobin: 10.9 g/dL — ABNORMAL LOW (ref 13.0–17.0)
Immature Granulocytes: 0 %
Lymphocytes Relative: 77 %
Lymphs Abs: 0.6 10*3/uL — ABNORMAL LOW (ref 0.7–4.0)
MCH: 31.7 pg (ref 26.0–34.0)
MCHC: 32.2 g/dL (ref 30.0–36.0)
MCV: 98.3 fL (ref 80.0–100.0)
Monocytes Absolute: 0.1 10*3/uL (ref 0.1–1.0)
Monocytes Relative: 15 %
Neutro Abs: 0.1 10*3/uL — CL (ref 1.7–7.7)
Neutrophils Relative %: 8 %
Platelet Count: 82 10*3/uL — ABNORMAL LOW (ref 150–400)
RBC: 3.44 MIL/uL — ABNORMAL LOW (ref 4.22–5.81)
RDW: 22.6 % — ABNORMAL HIGH (ref 11.5–15.5)
WBC Count: 0.8 10*3/uL — CL (ref 4.0–10.5)
nRBC: 0 % (ref 0.0–0.2)

## 2020-10-17 LAB — SAMPLE TO BLOOD BANK

## 2020-10-17 LAB — MAGNESIUM: Magnesium: 2 mg/dL (ref 1.7–2.4)

## 2020-10-17 MED ORDER — SODIUM CHLORIDE 0.9% FLUSH
10.0000 mL | Freq: Once | INTRAVENOUS | Status: AC
  Administered 2020-10-17: 10 mL
  Filled 2020-10-17: qty 10

## 2020-10-17 MED ORDER — HEPARIN SOD (PORK) LOCK FLUSH 100 UNIT/ML IV SOLN
250.0000 [IU] | Freq: Once | INTRAVENOUS | Status: AC
Start: 2020-10-17 — End: 2020-10-17
  Administered 2020-10-17: 250 [IU]
  Filled 2020-10-17: qty 5

## 2020-10-17 NOTE — Telephone Encounter (Signed)
Called critical WBC 0.8 and ANC 0.1 to triage nurse and faxed all results.

## 2020-10-17 NOTE — Progress Notes (Addendum)
  [facility] OFFICE PROGRESS NOTE   Diagnosis: AML  INTERVAL HISTORY:   Mr. Stanke returns as scheduled.  He began cycle 2 5 azacitidine and venetoclax on 09/19/2020.  Overall he is feeling well.  He denies bruising/bleeding.  No fever.  He has good appetite.  Rib pain continues to be improved.  Objective:  Vital signs in last 24 hours:  Blood pressure 131/64, pulse 77, temperature (!) 97.5 F (36.4 C), temperature source Tympanic, resp. rate 16, height $RemoveBe'5\' 11"'XrMUspFal$  (1.803 m), weight 188 lb 4.8 oz (85.4 kg), SpO2 98 %.    HEENT: No thrush or ulcers. Resp: Lungs clear bilaterally. Cardio: Regular rate and rhythm. GI: Abdomen soft and nontender.  No hepatosplenomegaly. Vascular: No leg edema. Skin: Skin in general has a dry appearance.  Resolving ecchymosis right lateral chest wall. Right upper extremity PICC without erythema.   Lab Results:  Lab Results  Component Value Date   WBC 0.8 (LL) 10/17/2020   HGB 10.9 (L) 10/17/2020   HCT 33.8 (L) 10/17/2020   MCV 98.3 10/17/2020   PLT 82 (L) 10/17/2020   NEUTROABS PENDING 10/17/2020    Imaging:  No results found.  Medications: I have reviewed the patient's current medications.  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, FLT3-ITD mutation positive  Cycle 1 azacytidine and venetoclax August 09, 2020;venetoclax dose reduced to 200 mg beginning 08/22/2020  Bone marrow biopsy 09/01/2020-no increase in blast cells  Cycle 2 azacitidine and venetoclax September 19, 2020, venetoclax 400 mg daily 21 days, change to 200 mg daily when he began Diflucan/Levaquin prophylaxis  2.Pancytopenia secondary to #1 and chemotherapy 3.Right PICC placed August 08, 2020 4.TTE August 08, 2020-EF 60-65% 5.Fever during hospital admission October 2021, suspicion of pneumonia, treated with cefepime and discharged on Levaquin 6.Hypervolemia with respiratory failure during hospital admission October 2021-diuresis, home Lasix 7.Hypothyroidism secondary to thyroidectomy and radiation-thyroid hormone increased October 2021 8.Acute renal failure October 2021-improved   Disposition: Mr. Ayars appears stable.  He has completed 2 cycles of 5-azacytidine and venetoclax.  Per Dr. Linus Orn at [facility] the next cycle will be delayed until 10/31/2020.  We will see him in follow-up prior to proceeding with treatment that day.  We reviewed the CBC from today.  He has persistent severe neutropenia.  He understands to contact the office with fever, chills, other signs of infection.  Plan for repeat CBC 10/20/2020 and 10/24/2020.  He will return lab, follow-up, cycle 3 5 azacitidine and venetoclax 10/31/2020.  He will contact the office in the interim as outlined above or with any other problems.  Patient seen with Dr. Benay Spice.   [name] ANP/GNP-BC   10/17/2020  12:16 PM This was a shared visit with [name].  He has persistent pancytopenia.  We will hold the 5 azacytidine/venetoclax as recommended by Dr. Linus Orn.  [name], MD

## 2020-10-17 NOTE — Progress Notes (Signed)
CRITICAL VALUE STICKER  CRITICAL VALUE: WBC 0.8  (ANC pending)  RECEIVER (on-site recipient of call): Mckaylie Vasey,RN   DATE & TIME NOTIFIED: 10/17/20 @ 1148 MESSENGER (representative from lab): [name]  MD NOTIFIED: [name], NP  TIME OF NOTIFICATION: 1150  RESPONSE: Seeing patient now. Already on antibiotics

## 2020-10-17 NOTE — Patient Instructions (Signed)
PICC Home Care Guide  A peripherally inserted central catheter (PICC) is a form of IV access that allows medicines and IV fluids to be quickly distributed throughout the body. The PICC is a long, thin, flexible tube (catheter) that is inserted into a vein in the upper arm. The catheter ends in a large vein in the chest (superior vena cava, or SVC). After the PICC is inserted, a chest X-ray may be done to make sure that it is in the correct place. A PICC may be placed for different reasons, such as:  To give medicines and liquid nutrition.  To give IV fluids and blood products.  If there is trouble placing a peripheral intravenous (PIV) catheter. If taken care of properly, a PICC can remain in place for several months. Having a PICC can also allow a person to go home from the hospital sooner. Medicine and PICC care can be managed at home by a family member, caregiver, or home health care team. What are the risks? Generally, having a PICC is safe. However, problems may occur, including:  A blood clot (thrombus) forming in or at the tip of the PICC.  A blood clot forming in a vein (deep vein thrombosis) or traveling to the lung (pulmonary embolism).  Inflammation of the vein (phlebitis) in which the PICC is placed.  Infection. Central line associated blood stream infection (CLABSI) is a serious infection that often requires hospitalization.  PICC movement (malposition). The PICC tip may move from its original position due to excessive physical activity, forceful coughing, sneezing, or vomiting.  A break or cut in the PICC. It is important not to use scissors near the PICC.  Nerve or tendon irritation or injury during PICC insertion. How to take care of your PICC Preventing problems  You and any caregivers should wash your hands often with soap. Wash hands: ? Before touching the PICC line or the infusion device. ? Before changing a bandage (dressing).  Flush the PICC as told by your  health care provider. Let your health care provider know right away if the PICC is hard to flush or does not flush. Do not use force to flush the PICC.  Do not use a syringe that is less than 10 mL to flush the PICC.  Avoid blood pressure checks on the arm in which the PICC is placed.  Never pull or tug on the PICC.  Do not take the PICC out yourself. Only a trained clinical professional should remove the PICC.  Use clean and sterile supplies only. Keep the supplies in a dry place. Do not reuse needles, syringes, or any other supplies. Doing that can lead to infection.  Keep pets and children away from your PICC line.  Check the PICC insertion site every day for signs of infection. Check for: ? Leakage. ? Redness, swelling, or pain. ? Fluid or blood. ? Warmth. ? Pus or a bad smell. PICC dressing care  Keep your PICC bandage (dressing) clean and dry to prevent infection.  Do not take baths, swim, or use a hot tub until your health care provider approves. Ask your health care provider if you can take showers. You may only be allowed to take sponge baths for bathing. When you are allowed to shower: ? Ask your health care provider to teach you how to wrap the PICC line. ? Cover the PICC line with clear plastic wrap and tape to keep it dry while showering.  Follow instructions from your health care provider   about how to take care of your insertion site and dressing. Make sure you: ? Wash your hands with soap and water before you change your bandage (dressing). If soap and water are not available, use hand sanitizer. ? Change your dressing as told by your health care provider. ? Leave stitches (sutures), skin glue, or adhesive strips in place. These skin closures may need to stay in place for 2 weeks or longer. If adhesive strip edges start to loosen and curl up, you may trim the loose edges. Do not remove adhesive strips completely unless your health care provider tells you to do  that.  Change your PICC dressing if it becomes loose or wet. General instructions   Carry your PICC identification card or wear a medical alert bracelet at all times.  Keep the tube clamped at all times, unless it is being used.  Carry a smooth-edge clamp with you at all times to place on the tube if it breaks.  Do not use scissors or sharp objects near the tube.  You may bend your arm and move it freely. If your PICC is near or at the bend of your elbow, avoid activity with repeated motion at the elbow.  Avoid lifting heavy objects as told by your health care provider.  Keep all follow-up visits as told by your health care provider. This is important. Disposal of supplies  Throw away any syringes in a disposal container that is meant for sharp items (sharps container). You can buy a sharps container from a pharmacy, or you can make one by using an empty hard plastic bottle with a cover.  Place any used dressings or infusion bags into a plastic bag. Throw that bag in the trash. Contact a health care provider if:  You have pain in your arm, ear, face, or teeth.  You have a fever or chills.  You have redness, swelling, or pain around the insertion site.  You have fluid or blood coming from the insertion site.  Your insertion site feels warm to the touch.  You have pus or a bad smell coming from the insertion site.  Your skin feels hard and raised around the insertion site. Get help right away if:  Your PICC is accidentally pulled all the way out. If this happens, cover the insertion site with a bandage or gauze dressing. Do not throw the PICC away. Your health care provider will need to check it.  Your PICC was tugged or pulled and has partially come out. Do not  push the PICC back in.  You cannot flush the PICC, it is hard to flush, or the PICC leaks around the insertion site when it is flushed.  You hear a "flushing" sound when the PICC is flushed.  You feel your  heart racing or skipping beats.  There is a hole or tear in the PICC.  You have swelling in the arm in which the PICC was inserted.  You have a red streak going up your arm from where the PICC was inserted. Summary  A peripherally inserted central catheter (PICC) is a long, thin, flexible tube (catheter) that is inserted into a vein in the upper arm.  The PICC is inserted using a sterile technique by a specially trained nurse or physician. Only a trained clinical professional should remove it.  Keep your PICC identification card with you at all times.  Avoid blood pressure checks on the arm in which the PICC is placed.  If cared for   properly, a PICC can remain in place for several months. Having a PICC can also allow a person to go home from the hospital sooner. This information is not intended to replace advice given to you by your health care provider. Make sure you discuss any questions you have with your health care provider. Document Revised: 10/04/2017 Document Reviewed: 11/24/2016 Elsevier Patient Education  2020 [employer]  

## 2020-10-18 ENCOUNTER — Inpatient Hospital Stay: Payer: Medicare Other

## 2020-10-18 ENCOUNTER — Telehealth: Payer: Self-pay | Admitting: Nurse Practitioner

## 2020-10-18 NOTE — Telephone Encounter (Signed)
Scheduled appointments per 12/13 los. Spoke to patient and wife who are both aware of appointments dates and times.

## 2020-10-19 ENCOUNTER — Encounter: Payer: Self-pay | Admitting: Physical Therapy

## 2020-10-19 ENCOUNTER — Inpatient Hospital Stay: Payer: Medicare Other

## 2020-10-19 ENCOUNTER — Ambulatory Visit: Payer: Medicare Other | Admitting: Physical Therapy

## 2020-10-19 ENCOUNTER — Other Ambulatory Visit: Payer: Self-pay

## 2020-10-19 DIAGNOSIS — R2689 Other abnormalities of gait and mobility: Secondary | ICD-10-CM

## 2020-10-19 DIAGNOSIS — M6281 Muscle weakness (generalized): Secondary | ICD-10-CM

## 2020-10-19 DIAGNOSIS — M25552 Pain in left hip: Secondary | ICD-10-CM

## 2020-10-19 DIAGNOSIS — R296 Repeated falls: Secondary | ICD-10-CM

## 2020-10-19 NOTE — Therapy (Signed)
[place]. [place], [place], [address] Phone: [phone]   Fax:  [phone]  Physical Therapy Treatment  Patient Details  Name: Victor [name] MRN: [id number] Date of Birth: 11/30/1935 Referring Provider (PT): Clinton Quant Date: 10/19/2020   PT End of Session - 10/19/20 1709    Visit Number 3    Date for PT Re-Evaluation 12/12/20    Authorization Type BCBS    PT Start Time 1612    PT Stop Time 1659    PT Time Calculation (min) 47 min    Activity Tolerance Patient tolerated treatment well    Behavior During Therapy [facility] for tasks assessed/performed           Past Medical History:  Diagnosis Date  . Arthritis   . Atherosclerosis   . Atherosclerotic PVD with intermittent claudication (HCC)    stent left leg  . BPH (benign prostatic hyperplasia)   . Bulging lumbar disc   . Cancer ([place]) 11/12/14   vocal cord  carcinoma in situ , radiation; thyroid cancer  . Carotid bruit   . Chronic kidney disease    chronic stage III  . COPD (chronic obstructive pulmonary disease) ([place])   . DDD (degenerative disc disease), lumbar   . Dysphonia   . Dysplasia of true vocal cord   . GERD (gastroesophageal reflux disease)   . H/O carotid atherosclerosis    b/l  . Hyperlipidemia   . Hypothyroidism   . Occasional tremors    left hand managed with propranolol  . Peripheral vascular angioplasty status with implants and grafts   . Precancerous lesion 03/05/2018   premelanoma removed from back.   . Radiation 03/10/15- 04/18/16   Larynx  . Sleep apnea    hasn't used CPAP in years  . Spondylosis of lumbosacral region   . Thyroid disease     Past Surgical History:  Procedure Laterality Date  . APPENDECTOMY    . COLONOSCOPY W/ POLYPECTOMY    . DIODE LASER APPLICATION Left 99/83/3825   Procedure: DIODE LASER APPLICATION;  Surgeon: Hayden Pedro, MD;  Location: [place];  Service: Ophthalmology;  Laterality: Left;   . MEMBRANE PEEL Left 08/18/2019   Procedure: MEMBRANE PEEL;  Surgeon: Hayden Pedro, MD;  Location: [place];  Service: Ophthalmology;  Laterality: Left;  . MOUTH SURGERY     tooth extraction  . PARS PLANA VITRECTOMY Left 08/18/2019   PARS PLANA VITRECTOMY 27 GAUGE (Left)  . PARS PLANA VITRECTOMY 27 GAUGE Left 08/18/2019   Procedure: PARS PLANA VITRECTOMY 27 GAUGE;  Surgeon: Hayden Pedro, MD;  Location: [place];  Service: Ophthalmology;  Laterality: Left;  . PHOTOCOAGULATION WITH LASER Left 08/18/2019   Procedure: PHOTOCOAGULATION WITH LASER;  Surgeon: Hayden Pedro, MD;  Location: [place];  Service: Ophthalmology;  Laterality: Left;  . THYROID SURGERY    . TONSILLECTOMY    . vocal cord biopsy  11/12/14   squamous cell carcinoma in situ    There were no vitals filed for this visit.   Subjective Assessment - 10/19/20 1634    Subjective a little tired, no falls, I can't remember the exercises with my ankles    Currently in Pain? No/denies                             [facility] Adult PT Treatment/Exercise - 10/19/20 0001      High Level Balance  High Level Balance Comments on airex ball toss, airex balance beam walking tandem and side stepping, stepping over objects and onto dynamic surfaces forward and backward      Knee/Hip Exercises: Aerobic   Elliptical 5 minutes 1.37mh    Nustep level 4 x 5 minutes      Knee/Hip Exercises: Machines for Strengthening   Cybex Knee Extension 10# 2x10    Cybex Knee Flexion 25# 2x10      Knee/Hip Exercises: Standing   Hip Abduction Both;2 sets;10 reps    Abduction Limitations 3#      Knee/Hip Exercises: Seated   Other Seated Knee/Hip Exercises ankle eversion with yellow tband                    PT Short Term Goals - 10/19/20 1712      PT SHORT TERM GOAL #1   Title Pt will be independent and compliant with initial HEP.    Status Partially Met             PT Long Term Goals - 10/11/20 1707      PT LONG  TERM GOAL #1   Title incresae hip strength ti 4/5    Time 8    Period Weeks    Status New      PT LONG TERM GOAL #2   Title increase UE strength to 4+/5    Time 8    Period Weeks    Status New      PT LONG TERM GOAL #3   Title decrease TUG time to 14 seconds    Time 8    Period Weeks    Status New      PT LONG TERM GOAL #4   Title improve berg balance test score to 50/56    Time 8    Period Weeks    Status New      PT LONG TERM GOAL #5   Title understand safety and fall risks at home    Time 8    Period Weeks    Status New                 Plan - 10/19/20 1710    Clinical Impression Statement Patient really seemed to do well with the balance today , better than the first day, less loss of balance, with some cues he was able to  remember the ankle exercises.  he did well with this with less cues for no hip rotation.  He did fatigue on the treadmill at 5 minutes and needed to rest    PT Next Visit Plan Will work on strength, balance and function    Consulted and Agree with Plan of Care Patient           Patient will benefit from skilled therapeutic intervention in order to improve the following deficits and impairments:  Decreased balance,Decreased mobility,Decreased strength,Postural dysfunction,Improper body mechanics,Impaired flexibility,Hypomobility,Decreased range of motion,Increased fascial restricitons,Dizziness,Decreased endurance,Cardiopulmonary status limiting activity,Decreased activity tolerance  Visit Diagnosis: Muscle weakness (generalized)  Other abnormalities of gait and mobility  Repeated falls  Pain in left hip     Problem List Patient Active Problem List   Diagnosis Date Noted  . Closed fracture of one rib of right side 10/04/2020  . Goals of care, counseling/discussion 09/05/2020  . PICC (peripherally inserted central catheter) in place 08/22/2020  . 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 Thrombocytopenia ([place] 08/05/2020  . Anemia  08/05/2020  . AKI (acute kidney injury) (  [place]) 08/05/2020  . Hematologic malignancy ([place]) 08/05/2020  . Acute leukemia ([place]) 08/05/2020  . Hypothyroidism 08/05/2020  . Malaise and fatigue 08/04/2020  . Obesity (BMI 30-39.9) 08/04/2020  . COPD, moderate ([place]) 05/24/2020  . Bilateral hip pain 05/04/2020  . Plantar fasciitis of left foot 03/30/2020  . Chronic left SI joint pain 03/01/2020  . Shortness of breath 10/01/2019  . Former smoker 10/01/2019  . Healthcare maintenance 10/01/2019  . Epiretinal membrane (ERM) of left eye 07/22/2019  . Centrilobular emphysema ([place]) 07/02/2019  . Pharyngeal dysphagia 04/07/2019  . Cervical radiculopathy 03/16/2019  . 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 Lumbar radiculopathy 04/16/2017  . Rotator cuff syndrome of left shoulder 04/16/2017  . Trochanteric bursitis, left hip 04/16/2017  . Atherosclerosis of artery of both lower extremities ([place]) 11/14/2016  . Bilateral carotid artery stenosis 11/14/2016  . Vitamin D deficiency 07/31/2016  . Facet arthritis of lumbar region 09/26/2015  . 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  . History of thyroid cancer 03/26/2014  . Benign prostatic hyperplasia with urinary obstruction 03/23/2014  . Dysphonia 03/17/2014  . Gastro-esophageal reflux disease without esophagitis 03/17/2014  . Hyperlipidemia 03/17/2014  . Other intervertebral disc degeneration, lumbar region 03/17/2014  . PAD (peripheral artery disease) ([place]) 03/17/2014  . Postoperative hypothyroidism 03/17/2014  . Chronic kidney disease, stage 2 (mild) 03/17/2014  . Occasional tremors 03/17/2014    Sumner Boast., PT 10/19/2020, 5:13 PM  [place]. [place], [place], [address] Phone: [phone]   Fax:  [phone]  Name: KONNOR VONDRASEK MRN: [id number] Date of Birth: 05/21/1936

## 2020-10-20 ENCOUNTER — Other Ambulatory Visit: Payer: Self-pay | Admitting: *Deleted

## 2020-10-20 ENCOUNTER — Inpatient Hospital Stay: Payer: Medicare Other

## 2020-10-20 ENCOUNTER — Encounter: Payer: Self-pay | Admitting: *Deleted

## 2020-10-20 ENCOUNTER — Ambulatory Visit: Payer: Medicare Other

## 2020-10-20 ENCOUNTER — Other Ambulatory Visit: Payer: Self-pay

## 2020-10-20 DIAGNOSIS — C9500 Acute leukemia of unspecified cell type not having achieved remission: Secondary | ICD-10-CM

## 2020-10-20 DIAGNOSIS — Z452 Encounter for adjustment and management of vascular access device: Secondary | ICD-10-CM

## 2020-10-20 DIAGNOSIS — C9200 Acute myeloblastic leukemia, not having achieved remission: Secondary | ICD-10-CM | POA: Diagnosis not present

## 2020-10-20 LAB — CBC WITH DIFFERENTIAL (CANCER CENTER ONLY)
Abs Immature Granulocytes: 0.01 10*3/uL (ref 0.00–0.07)
Basophils Absolute: 0 10*3/uL (ref 0.0–0.1)
Basophils Relative: 1 %
Eosinophils Absolute: 0 10*3/uL (ref 0.0–0.5)
Eosinophils Relative: 0 %
HCT: 33.9 % — ABNORMAL LOW (ref 39.0–52.0)
Hemoglobin: 11 g/dL — ABNORMAL LOW (ref 13.0–17.0)
Immature Granulocytes: 1 %
Lymphocytes Relative: 60 %
Lymphs Abs: 0.8 10*3/uL (ref 0.7–4.0)
MCH: 31.9 pg (ref 26.0–34.0)
MCHC: 32.4 g/dL (ref 30.0–36.0)
MCV: 98.3 fL (ref 80.0–100.0)
Monocytes Absolute: 0.3 10*3/uL (ref 0.1–1.0)
Monocytes Relative: 25 %
Neutro Abs: 0.2 10*3/uL — CL (ref 1.7–7.7)
Neutrophils Relative %: 13 %
Platelet Count: 93 10*3/uL — ABNORMAL LOW (ref 150–400)
RBC: 3.45 MIL/uL — ABNORMAL LOW (ref 4.22–5.81)
RDW: 22.2 % — ABNORMAL HIGH (ref 11.5–15.5)
WBC Count: 1.4 10*3/uL — ABNORMAL LOW (ref 4.0–10.5)
nRBC: 0 % (ref 0.0–0.2)

## 2020-10-20 MED ORDER — SODIUM CHLORIDE 0.9% FLUSH
10.0000 mL | Freq: Once | INTRAVENOUS | Status: AC
  Administered 2020-10-20: 10 mL
  Filled 2020-10-20: qty 10

## 2020-10-20 MED ORDER — HEPARIN SOD (PORK) LOCK FLUSH 100 UNIT/ML IV SOLN
250.0000 [IU] | Freq: Once | INTRAVENOUS | Status: AC
  Administered 2020-10-20: 250 [IU]
  Filled 2020-10-20: qty 5

## 2020-10-20 NOTE — Progress Notes (Signed)
Faxed today's CBC results to [facility] and discussed with patient as well. He will continue his antibiotics and F/U on 12/20 as scheduled. Per new orders from [facility], labs are now weekly, but he will still come in for twice weekly PICC care. Chemo planned to begin on 12/27 at the same dose of 75 mg/m2 and Venetoclax at 400 mg daily x 21 days.

## 2020-10-21 ENCOUNTER — Other Ambulatory Visit: Payer: Medicare Other

## 2020-10-21 ENCOUNTER — Ambulatory Visit: Payer: Medicare Other

## 2020-10-24 ENCOUNTER — Ambulatory Visit: Payer: Medicare Other

## 2020-10-24 ENCOUNTER — Encounter: Payer: Self-pay | Admitting: *Deleted

## 2020-10-24 ENCOUNTER — Inpatient Hospital Stay: Payer: Medicare Other

## 2020-10-24 ENCOUNTER — Other Ambulatory Visit: Payer: Self-pay

## 2020-10-24 ENCOUNTER — Encounter: Payer: Medicare Other | Admitting: Physical Therapy

## 2020-10-24 DIAGNOSIS — C9500 Acute leukemia of unspecified cell type not having achieved remission: Secondary | ICD-10-CM

## 2020-10-24 DIAGNOSIS — Z452 Encounter for adjustment and management of vascular access device: Secondary | ICD-10-CM

## 2020-10-24 DIAGNOSIS — C9200 Acute myeloblastic leukemia, not having achieved remission: Secondary | ICD-10-CM | POA: Diagnosis not present

## 2020-10-24 LAB — CBC WITH DIFFERENTIAL (CANCER CENTER ONLY)
Abs Immature Granulocytes: 0.01 10*3/uL (ref 0.00–0.07)
Basophils Absolute: 0 10*3/uL (ref 0.0–0.1)
Basophils Relative: 1 %
Eosinophils Absolute: 0 10*3/uL (ref 0.0–0.5)
Eosinophils Relative: 0 %
HCT: 33.3 % — ABNORMAL LOW (ref 39.0–52.0)
Hemoglobin: 10.9 g/dL — ABNORMAL LOW (ref 13.0–17.0)
Immature Granulocytes: 1 %
Lymphocytes Relative: 46 %
Lymphs Abs: 0.9 10*3/uL (ref 0.7–4.0)
MCH: 32.5 pg (ref 26.0–34.0)
MCHC: 32.7 g/dL (ref 30.0–36.0)
MCV: 99.4 fL (ref 80.0–100.0)
Monocytes Absolute: 0.4 10*3/uL (ref 0.1–1.0)
Monocytes Relative: 20 %
Neutro Abs: 0.6 10*3/uL — ABNORMAL LOW (ref 1.7–7.7)
Neutrophils Relative %: 32 %
Platelet Count: 56 10*3/uL — ABNORMAL LOW (ref 150–400)
RBC: 3.35 MIL/uL — ABNORMAL LOW (ref 4.22–5.81)
RDW: 21.8 % — ABNORMAL HIGH (ref 11.5–15.5)
WBC Count: 1.8 10*3/uL — ABNORMAL LOW (ref 4.0–10.5)
nRBC: 0 % (ref 0.0–0.2)

## 2020-10-24 LAB — CMP (CANCER CENTER ONLY)
ALT: 8 U/L (ref 0–44)
AST: 11 U/L — ABNORMAL LOW (ref 15–41)
Albumin: 3.3 g/dL — ABNORMAL LOW (ref 3.5–5.0)
Alkaline Phosphatase: 65 U/L (ref 38–126)
Anion gap: 9 (ref 5–15)
BUN: 17 mg/dL (ref 8–23)
CO2: 24 mmol/L (ref 22–32)
Calcium: 8.8 mg/dL — ABNORMAL LOW (ref 8.9–10.3)
Chloride: 110 mmol/L (ref 98–111)
Creatinine: 0.9 mg/dL (ref 0.61–1.24)
GFR, Estimated: >60 mL/min (ref >60)
Glucose, Bld: 85 mg/dL (ref 70–99)
Potassium: 3.8 mmol/L (ref 3.5–5.1)
Sodium: 143 mmol/L (ref 135–145)
Total Bilirubin: 0.6 mg/dL (ref 0.3–1.2)
Total Protein: 5.6 g/dL — ABNORMAL LOW (ref 6.5–8.1)

## 2020-10-24 LAB — MAGNESIUM: Magnesium: 2 mg/dL (ref 1.7–2.4)

## 2020-10-24 MED ORDER — HEPARIN SOD (PORK) LOCK FLUSH 100 UNIT/ML IV SOLN
250.0000 [IU] | Freq: Once | INTRAVENOUS | Status: AC
Start: 2020-10-24 — End: 2020-10-24
  Administered 2020-10-24: 250 [IU]
  Filled 2020-10-24: qty 5

## 2020-10-24 MED ORDER — SODIUM CHLORIDE 0.9% FLUSH
10.0000 mL | Freq: Once | INTRAVENOUS | Status: AC
  Administered 2020-10-24: 10 mL
  Filled 2020-10-24: qty 10

## 2020-10-24 NOTE — Progress Notes (Signed)
Faxed CBC/diff and CMP results to [facility] #[phone]. Patient feeling well.

## 2020-10-24 NOTE — Patient Instructions (Signed)
PICC Home Care Guide  A peripherally inserted central catheter (PICC) is a form of IV access that allows medicines and IV fluids to be quickly distributed throughout the body. The PICC is a long, thin, flexible tube (catheter) that is inserted into a vein in the upper arm. The catheter ends in a large vein in the chest (superior vena cava, or SVC). After the PICC is inserted, a chest X-ray may be done to make sure that it is in the correct place. A PICC may be placed for different reasons, such as:  To give medicines and liquid nutrition.  To give IV fluids and blood products.  If there is trouble placing a peripheral intravenous (PIV) catheter. If taken care of properly, a PICC can remain in place for several months. Having a PICC can also allow a person to go home from the hospital sooner. Medicine and PICC care can be managed at home by a family member, caregiver, or home health care team. What are the risks? Generally, having a PICC is safe. However, problems may occur, including:  A blood clot (thrombus) forming in or at the tip of the PICC.  A blood clot forming in a vein (deep vein thrombosis) or traveling to the lung (pulmonary embolism).  Inflammation of the vein (phlebitis) in which the PICC is placed.  Infection. Central line associated blood stream infection (CLABSI) is a serious infection that often requires hospitalization.  PICC movement (malposition). The PICC tip may move from its original position due to excessive physical activity, forceful coughing, sneezing, or vomiting.  A break or cut in the PICC. It is important not to use scissors near the PICC.  Nerve or tendon irritation or injury during PICC insertion. How to take care of your PICC Preventing problems  You and any caregivers should wash your hands often with soap. Wash hands: ? Before touching the PICC line or the infusion device. ? Before changing a bandage (dressing).  Flush the PICC as told by your  health care provider. Let your health care provider know right away if the PICC is hard to flush or does not flush. Do not use force to flush the PICC.  Do not use a syringe that is less than 10 mL to flush the PICC.  Avoid blood pressure checks on the arm in which the PICC is placed.  Never pull or tug on the PICC.  Do not take the PICC out yourself. Only a trained clinical professional should remove the PICC.  Use clean and sterile supplies only. Keep the supplies in a dry place. Do not reuse needles, syringes, or any other supplies. Doing that can lead to infection.  Keep pets and children away from your PICC line.  Check the PICC insertion site every day for signs of infection. Check for: ? Leakage. ? Redness, swelling, or pain. ? Fluid or blood. ? Warmth. ? Pus or a bad smell. PICC dressing care  Keep your PICC bandage (dressing) clean and dry to prevent infection.  Do not take baths, swim, or use a hot tub until your health care provider approves. Ask your health care provider if you can take showers. You may only be allowed to take sponge baths for bathing. When you are allowed to shower: ? Ask your health care provider to teach you how to wrap the PICC line. ? Cover the PICC line with clear plastic wrap and tape to keep it dry while showering.  Follow instructions from your health care provider   about how to take care of your insertion site and dressing. Make sure you: ? Wash your hands with soap and water before you change your bandage (dressing). If soap and water are not available, use hand sanitizer. ? Change your dressing as told by your health care provider. ? Leave stitches (sutures), skin glue, or adhesive strips in place. These skin closures may need to stay in place for 2 weeks or longer. If adhesive strip edges start to loosen and curl up, you may trim the loose edges. Do not remove adhesive strips completely unless your health care provider tells you to do  that.  Change your PICC dressing if it becomes loose or wet. General instructions   Carry your PICC identification card or wear a medical alert bracelet at all times.  Keep the tube clamped at all times, unless it is being used.  Carry a smooth-edge clamp with you at all times to place on the tube if it breaks.  Do not use scissors or sharp objects near the tube.  You may bend your arm and move it freely. If your PICC is near or at the bend of your elbow, avoid activity with repeated motion at the elbow.  Avoid lifting heavy objects as told by your health care provider.  Keep all follow-up visits as told by your health care provider. This is important. Disposal of supplies  Throw away any syringes in a disposal container that is meant for sharp items (sharps container). You can buy a sharps container from a pharmacy, or you can make one by using an empty hard plastic bottle with a cover.  Place any used dressings or infusion bags into a plastic bag. Throw that bag in the trash. Contact a health care provider if:  You have pain in your arm, ear, face, or teeth.  You have a fever or chills.  You have redness, swelling, or pain around the insertion site.  You have fluid or blood coming from the insertion site.  Your insertion site feels warm to the touch.  You have pus or a bad smell coming from the insertion site.  Your skin feels hard and raised around the insertion site. Get help right away if:  Your PICC is accidentally pulled all the way out. If this happens, cover the insertion site with a bandage or gauze dressing. Do not throw the PICC away. Your health care provider will need to check it.  Your PICC was tugged or pulled and has partially come out. Do not  push the PICC back in.  You cannot flush the PICC, it is hard to flush, or the PICC leaks around the insertion site when it is flushed.  You hear a "flushing" sound when the PICC is flushed.  You feel your  heart racing or skipping beats.  There is a hole or tear in the PICC.  You have swelling in the arm in which the PICC was inserted.  You have a red streak going up your arm from where the PICC was inserted. Summary  A peripherally inserted central catheter (PICC) is a long, thin, flexible tube (catheter) that is inserted into a vein in the upper arm.  The PICC is inserted using a sterile technique by a specially trained nurse or physician. Only a trained clinical professional should remove it.  Keep your PICC identification card with you at all times.  Avoid blood pressure checks on the arm in which the PICC is placed.  If cared for   properly, a PICC can remain in place for several months. Having a PICC can also allow a person to go home from the hospital sooner. This information is not intended to replace advice given to you by your health care provider. Make sure you discuss any questions you have with your health care provider. Document Revised: 10/04/2017 Document Reviewed: 11/24/2016 Elsevier Patient Education  2020 [employer]  

## 2020-10-25 ENCOUNTER — Ambulatory Visit: Payer: Medicare Other | Admitting: Physical Therapy

## 2020-10-25 ENCOUNTER — Encounter: Payer: Self-pay | Admitting: Physical Therapy

## 2020-10-25 ENCOUNTER — Ambulatory Visit: Payer: Medicare Other

## 2020-10-25 ENCOUNTER — Other Ambulatory Visit: Payer: Medicare Other

## 2020-10-25 DIAGNOSIS — M25552 Pain in left hip: Secondary | ICD-10-CM

## 2020-10-25 DIAGNOSIS — R2689 Other abnormalities of gait and mobility: Secondary | ICD-10-CM

## 2020-10-25 DIAGNOSIS — M6281 Muscle weakness (generalized): Secondary | ICD-10-CM | POA: Diagnosis not present

## 2020-10-25 DIAGNOSIS — R296 Repeated falls: Secondary | ICD-10-CM

## 2020-10-25 DIAGNOSIS — M25551 Pain in right hip: Secondary | ICD-10-CM

## 2020-10-25 NOTE — Therapy (Signed)
[place]. [place], [place], [address] Phone: [phone]   Fax:  [phone]  Physical Therapy Treatment  Patient Details  Name: Victor [name] MRN: [id number] Date of Birth: [birth year] Referring Provider (PT): Clinton Quant Date: 10/25/2020   PT End of Session - 10/25/20 1275    Visit Number 4    Number of Visits 16    Date for PT Re-Evaluation 12/12/20    Authorization Type BCBS    PT Start Time 1700    PT Stop Time 1527    PT Time Calculation (min) 42 min    Activity Tolerance Patient tolerated treatment well    Behavior During Therapy [facility] for tasks assessed/performed           Past Medical History:  Diagnosis Date  . Arthritis   . Atherosclerosis   . Atherosclerotic PVD with intermittent claudication (HCC)    stent left leg  . BPH (benign prostatic hyperplasia)   . Bulging lumbar disc   . Cancer ([place]) 11/12/14   vocal cord  carcinoma in situ , radiation; thyroid cancer  . Carotid bruit   . Chronic kidney disease    chronic stage III  . COPD (chronic obstructive pulmonary disease) ([place])   . DDD (degenerative disc disease), lumbar   . Dysphonia   . Dysplasia of true vocal cord   . GERD (gastroesophageal reflux disease)   . H/O carotid atherosclerosis    b/l  . Hyperlipidemia   . Hypothyroidism   . Occasional tremors    left hand managed with propranolol  . Peripheral vascular angioplasty status with implants and grafts   . Precancerous lesion 03/05/2018   premelanoma removed from back.   . Radiation 03/10/15- 04/18/16   Larynx  . Sleep apnea    hasn't used CPAP in years  . Spondylosis of lumbosacral region   . Thyroid disease     Past Surgical History:  Procedure Laterality Date  . APPENDECTOMY    . COLONOSCOPY W/ POLYPECTOMY    . DIODE LASER APPLICATION Left 17/49/4496   Procedure: DIODE LASER APPLICATION;  Surgeon: Hayden Pedro, MD;  Location: [place];  Service:  Ophthalmology;  Laterality: Left;  . MEMBRANE PEEL Left 08/18/2019   Procedure: MEMBRANE PEEL;  Surgeon: Hayden Pedro, MD;  Location: [place];  Service: Ophthalmology;  Laterality: Left;  . MOUTH SURGERY     tooth extraction  . PARS PLANA VITRECTOMY Left 08/18/2019   PARS PLANA VITRECTOMY 27 GAUGE (Left)  . PARS PLANA VITRECTOMY 27 GAUGE Left 08/18/2019   Procedure: PARS PLANA VITRECTOMY 27 GAUGE;  Surgeon: Hayden Pedro, MD;  Location: [place];  Service: Ophthalmology;  Laterality: Left;  . PHOTOCOAGULATION WITH LASER Left 08/18/2019   Procedure: PHOTOCOAGULATION WITH LASER;  Surgeon: Hayden Pedro, MD;  Location: [place];  Service: Ophthalmology;  Laterality: Left;  . THYROID SURGERY    . TONSILLECTOMY    . vocal cord biopsy  11/12/14   squamous cell carcinoma in situ    There were no vitals filed for this visit.   Subjective Assessment - 10/25/20 1452    Subjective I am doing okay, jsut off balance at times.    Currently in Pain? No/denies                             [facility] Adult PT Treatment/Exercise - 10/25/20 0001  High Level Balance   High Level Balance Comments walking ball toss and direction changes, airex balance beam walking tandem and side stepping, minitramp walking      Knee/Hip Exercises: Aerobic   Tread Mill 1.51mph x 6 minutes    Nustep level 6 x 5 minutes      Knee/Hip Exercises: Machines for Strengthening   Cybex Knee Extension 10# 3x10    Cybex Knee Flexion 25# 3x10      Knee/Hip Exercises: Standing   Hip Flexion Both;2 sets;10 reps    Hip Flexion Limitations 3#    Hip Abduction Both;2 sets;10 reps    Abduction Limitations 3#    Hip Extension Both;2 sets;10 reps    Extension Limitations 3#                    PT Short Term Goals - 10/25/20 1534      PT SHORT TERM GOAL #1   Title Pt will be independent and compliant with initial HEP.    Status Achieved             PT Long Term Goals - 10/25/20 1534      PT  LONG TERM GOAL #1   Title incresae hip strength ti 4/5    Status On-going      PT LONG TERM GOAL #2   Title increase UE strength to 4+/5    Status On-going                 Plan - 10/25/20 1532    Clinical Impression Statement Patient with no falls, I did talk with him about his shoes, he has extensive wear on the right latreral shoe especially the heel, this causes him to over supinate and he has almost rolled his ankle a few times on the dynamic surface.  He reports that he will look into new shoes.  He is moving the right ankle better today    PT Next Visit Plan Will work on strength, balance and function    Consulted and Agree with Plan of Care Patient           Patient will benefit from skilled therapeutic intervention in order to improve the following deficits and impairments:  Decreased balance,Decreased mobility,Decreased strength,Postural dysfunction,Improper body mechanics,Impaired flexibility,Hypomobility,Decreased range of motion,Increased fascial restricitons,Dizziness,Decreased endurance,Cardiopulmonary status limiting activity,Decreased activity tolerance  Visit Diagnosis: Muscle weakness (generalized)  Other abnormalities of gait and mobility  Repeated falls  Pain in left hip  Pain in right hip     Problem List Patient Active Problem List   Diagnosis Date Noted  . Closed fracture of one rib of right side 10/04/2020  . Goals of care, counseling/discussion 09/05/2020  . PICC (peripherally inserted central catheter) in place 08/22/2020  . 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 Thrombocytopenia ([place]) 08/05/2020  . Anemia 08/05/2020  . AKI (acute kidney injury) ([place]) 08/05/2020  . Hematologic malignancy ([place]) 08/05/2020  . Acute leukemia ([place]) 08/05/2020  . Hypothyroidism 08/05/2020  . Malaise and fatigue 08/04/2020  . Obesity (BMI 30-39.9) 08/04/2020  . COPD, moderate ([place]) 05/24/2020  . Bilateral hip pain 05/04/2020  . Plantar fasciitis of left foot  03/30/2020  . Chronic left SI joint pain 03/01/2020  . Shortness of breath 10/01/2019  . Former smoker 10/01/2019  . Healthcare maintenance 10/01/2019  . Epiretinal membrane (ERM) of left eye 07/22/2019  . Centrilobular emphysema ([place]) 07/02/2019  . Pharyngeal dysphagia 04/07/2019  . Cervical radiculopathy 03/16/2019  . History of glottic cancer  09/08/2018  . Lumbar radiculopathy 04/16/2017  . Rotator cuff syndrome of left shoulder 04/16/2017  . Trochanteric bursitis, left hip 04/16/2017  . Atherosclerosis of artery of both lower extremities ([place]) 11/14/2016  . Bilateral carotid artery stenosis 11/14/2016  . Vitamin D deficiency 07/31/2016  . Facet arthritis of lumbar region 09/26/2015  . 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  . History of thyroid cancer 03/26/2014  . Benign prostatic hyperplasia with urinary obstruction 03/23/2014  . Dysphonia 03/17/2014  . Gastro-esophageal reflux disease without esophagitis 03/17/2014  . Hyperlipidemia 03/17/2014  . Other intervertebral disc degeneration, lumbar region 03/17/2014  . PAD (peripheral artery disease) ([place]) 03/17/2014  . Postoperative hypothyroidism 03/17/2014  . Chronic kidney disease, stage 2 (mild) 03/17/2014  . Occasional tremors 03/17/2014    Sumner Boast., PT 10/25/2020, 3:35 PM  [place]. [place], [place], [address] Phone: [phone]   Fax:  [phone]  Name: MARRIO SCRIBNER MRN: [id number] Date of Birth: [birth year]

## 2020-10-27 ENCOUNTER — Inpatient Hospital Stay: Payer: Medicare Other

## 2020-10-27 ENCOUNTER — Other Ambulatory Visit: Payer: Self-pay

## 2020-10-27 ENCOUNTER — Other Ambulatory Visit: Payer: Medicare Other

## 2020-10-27 DIAGNOSIS — Z452 Encounter for adjustment and management of vascular access device: Secondary | ICD-10-CM

## 2020-10-27 DIAGNOSIS — C9200 Acute myeloblastic leukemia, not having achieved remission: Secondary | ICD-10-CM | POA: Diagnosis not present

## 2020-10-27 MED ORDER — HEPARIN SOD (PORK) LOCK FLUSH 100 UNIT/ML IV SOLN
250.0000 [IU] | Freq: Once | INTRAVENOUS | Status: AC
  Administered 2020-10-27: 250 [IU]
  Filled 2020-10-27: qty 5

## 2020-10-27 MED ORDER — SODIUM CHLORIDE 0.9% FLUSH
10.0000 mL | Freq: Once | INTRAVENOUS | Status: AC
  Administered 2020-10-27: 10 mL
  Filled 2020-10-27: qty 10

## 2020-10-27 NOTE — Patient Instructions (Signed)
PICC Home Care Guide  A peripherally inserted central catheter (PICC) is a form of IV access that allows medicines and IV fluids to be quickly distributed throughout the body. The PICC is a long, thin, flexible tube (catheter) that is inserted into a vein in the upper arm. The catheter ends in a large vein in the chest (superior vena cava, or SVC). After the PICC is inserted, a chest X-ray may be done to make sure that it is in the correct place. A PICC may be placed for different reasons, such as:  To give medicines and liquid nutrition.  To give IV fluids and blood products.  If there is trouble placing a peripheral intravenous (PIV) catheter. If taken care of properly, a PICC can remain in place for several months. Having a PICC can also allow a person to go home from the hospital sooner. Medicine and PICC care can be managed at home by a family member, caregiver, or home health care team. What are the risks? Generally, having a PICC is safe. However, problems may occur, including:  A blood clot (thrombus) forming in or at the tip of the PICC.  A blood clot forming in a vein (deep vein thrombosis) or traveling to the lung (pulmonary embolism).  Inflammation of the vein (phlebitis) in which the PICC is placed.  Infection. Central line associated blood stream infection (CLABSI) is a serious infection that often requires hospitalization.  PICC movement (malposition). The PICC tip may move from its original position due to excessive physical activity, forceful coughing, sneezing, or vomiting.  A break or cut in the PICC. It is important not to use scissors near the PICC.  Nerve or tendon irritation or injury during PICC insertion. How to take care of your PICC Preventing problems  You and any caregivers should wash your hands often with soap. Wash hands: ? Before touching the PICC line or the infusion device. ? Before changing a bandage (dressing).  Flush the PICC as told by your  health care provider. Let your health care provider know right away if the PICC is hard to flush or does not flush. Do not use force to flush the PICC.  Do not use a syringe that is less than 10 mL to flush the PICC.  Avoid blood pressure checks on the arm in which the PICC is placed.  Never pull or tug on the PICC.  Do not take the PICC out yourself. Only a trained clinical professional should remove the PICC.  Use clean and sterile supplies only. Keep the supplies in a dry place. Do not reuse needles, syringes, or any other supplies. Doing that can lead to infection.  Keep pets and children away from your PICC line.  Check the PICC insertion site every day for signs of infection. Check for: ? Leakage. ? Redness, swelling, or pain. ? Fluid or blood. ? Warmth. ? Pus or a bad smell. PICC dressing care  Keep your PICC bandage (dressing) clean and dry to prevent infection.  Do not take baths, swim, or use a hot tub until your health care provider approves. Ask your health care provider if you can take showers. You may only be allowed to take sponge baths for bathing. When you are allowed to shower: ? Ask your health care provider to teach you how to wrap the PICC line. ? Cover the PICC line with clear plastic wrap and tape to keep it dry while showering.  Follow instructions from your health care provider   about how to take care of your insertion site and dressing. Make sure you: ? Wash your hands with soap and water before you change your bandage (dressing). If soap and water are not available, use hand sanitizer. ? Change your dressing as told by your health care provider. ? Leave stitches (sutures), skin glue, or adhesive strips in place. These skin closures may need to stay in place for 2 weeks or longer. If adhesive strip edges start to loosen and curl up, you may trim the loose edges. Do not remove adhesive strips completely unless your health care provider tells you to do  that.  Change your PICC dressing if it becomes loose or wet. General instructions   Carry your PICC identification card or wear a medical alert bracelet at all times.  Keep the tube clamped at all times, unless it is being used.  Carry a smooth-edge clamp with you at all times to place on the tube if it breaks.  Do not use scissors or sharp objects near the tube.  You may bend your arm and move it freely. If your PICC is near or at the bend of your elbow, avoid activity with repeated motion at the elbow.  Avoid lifting heavy objects as told by your health care provider.  Keep all follow-up visits as told by your health care provider. This is important. Disposal of supplies  Throw away any syringes in a disposal container that is meant for sharp items (sharps container). You can buy a sharps container from a pharmacy, or you can make one by using an empty hard plastic bottle with a cover.  Place any used dressings or infusion bags into a plastic bag. Throw that bag in the trash. Contact a health care provider if:  You have pain in your arm, ear, face, or teeth.  You have a fever or chills.  You have redness, swelling, or pain around the insertion site.  You have fluid or blood coming from the insertion site.  Your insertion site feels warm to the touch.  You have pus or a bad smell coming from the insertion site.  Your skin feels hard and raised around the insertion site. Get help right away if:  Your PICC is accidentally pulled all the way out. If this happens, cover the insertion site with a bandage or gauze dressing. Do not throw the PICC away. Your health care provider will need to check it.  Your PICC was tugged or pulled and has partially come out. Do not  push the PICC back in.  You cannot flush the PICC, it is hard to flush, or the PICC leaks around the insertion site when it is flushed.  You hear a "flushing" sound when the PICC is flushed.  You feel your  heart racing or skipping beats.  There is a hole or tear in the PICC.  You have swelling in the arm in which the PICC was inserted.  You have a red streak going up your arm from where the PICC was inserted. Summary  A peripherally inserted central catheter (PICC) is a long, thin, flexible tube (catheter) that is inserted into a vein in the upper arm.  The PICC is inserted using a sterile technique by a specially trained nurse or physician. Only a trained clinical professional should remove it.  Keep your PICC identification card with you at all times.  Avoid blood pressure checks on the arm in which the PICC is placed.  If cared for   properly, a PICC can remain in place for several months. Having a PICC can also allow a person to go home from the hospital sooner. This information is not intended to replace advice given to you by your health care provider. Make sure you discuss any questions you have with your health care provider. Document Revised: 10/04/2017 Document Reviewed: 11/24/2016 Elsevier Patient Education  2020 [employer]  

## 2020-10-31 ENCOUNTER — Other Ambulatory Visit: Payer: Self-pay

## 2020-10-31 ENCOUNTER — Inpatient Hospital Stay: Payer: Medicare Other

## 2020-10-31 ENCOUNTER — Inpatient Hospital Stay (HOSPITAL_BASED_OUTPATIENT_CLINIC_OR_DEPARTMENT_OTHER): Payer: Medicare Other | Admitting: Oncology

## 2020-10-31 ENCOUNTER — Other Ambulatory Visit: Payer: Self-pay | Admitting: Oncology

## 2020-10-31 VITALS — BP 124/59 | HR 76 | Temp 98.1°F | Resp 18 | Ht 71.0 in | Wt 198.4 lb

## 2020-10-31 DIAGNOSIS — C9500 Acute leukemia of unspecified cell type not having achieved remission: Secondary | ICD-10-CM

## 2020-10-31 DIAGNOSIS — Z452 Encounter for adjustment and management of vascular access device: Secondary | ICD-10-CM

## 2020-10-31 DIAGNOSIS — C9200 Acute myeloblastic leukemia, not having achieved remission: Secondary | ICD-10-CM | POA: Diagnosis not present

## 2020-10-31 LAB — CMP (CANCER CENTER ONLY)
ALT: 9 U/L (ref 0–44)
AST: 10 U/L — ABNORMAL LOW (ref 15–41)
Albumin: 3.2 g/dL — ABNORMAL LOW (ref 3.5–5.0)
Alkaline Phosphatase: 57 U/L (ref 38–126)
Anion gap: 5 (ref 5–15)
BUN: 12 mg/dL (ref 8–23)
CO2: 28 mmol/L (ref 22–32)
Calcium: 8.2 mg/dL — ABNORMAL LOW (ref 8.9–10.3)
Chloride: 109 mmol/L (ref 98–111)
Creatinine: 0.83 mg/dL (ref 0.61–1.24)
GFR, Estimated: >60 mL/min (ref >60)
Glucose, Bld: 103 mg/dL — ABNORMAL HIGH (ref 70–99)
Potassium: 3.7 mmol/L (ref 3.5–5.1)
Sodium: 142 mmol/L (ref 135–145)
Total Bilirubin: 0.5 mg/dL (ref 0.3–1.2)
Total Protein: 5.2 g/dL — ABNORMAL LOW (ref 6.5–8.1)

## 2020-10-31 LAB — CBC WITH DIFFERENTIAL (CANCER CENTER ONLY)
Abs Immature Granulocytes: 0.03 10*3/uL (ref 0.00–0.07)
Basophils Absolute: 0 10*3/uL (ref 0.0–0.1)
Basophils Relative: 1 %
Eosinophils Absolute: 0 10*3/uL (ref 0.0–0.5)
Eosinophils Relative: 0 %
HCT: 32.5 % — ABNORMAL LOW (ref 39.0–52.0)
Hemoglobin: 10.6 g/dL — ABNORMAL LOW (ref 13.0–17.0)
Immature Granulocytes: 1 %
Lymphocytes Relative: 27 %
Lymphs Abs: 0.8 10*3/uL (ref 0.7–4.0)
MCH: 32.3 pg (ref 26.0–34.0)
MCHC: 32.6 g/dL (ref 30.0–36.0)
MCV: 99.1 fL (ref 80.0–100.0)
Monocytes Absolute: 0.4 10*3/uL (ref 0.1–1.0)
Monocytes Relative: 13 %
Neutro Abs: 1.8 10*3/uL (ref 1.7–7.7)
Neutrophils Relative %: 58 %
Platelet Count: 53 10*3/uL — ABNORMAL LOW (ref 150–400)
RBC: 3.28 MIL/uL — ABNORMAL LOW (ref 4.22–5.81)
RDW: 20.9 % — ABNORMAL HIGH (ref 11.5–15.5)
WBC Count: 3.1 10*3/uL — ABNORMAL LOW (ref 4.0–10.5)
nRBC: 0 % (ref 0.0–0.2)

## 2020-10-31 LAB — MAGNESIUM: Magnesium: 1.8 mg/dL (ref 1.7–2.4)

## 2020-10-31 MED ORDER — SODIUM CHLORIDE 0.9 % IV SOLN
Freq: Once | INTRAVENOUS | Status: AC
  Filled 2020-10-31: qty 250

## 2020-10-31 MED ORDER — HEPARIN SOD (PORK) LOCK FLUSH 100 UNIT/ML IV SOLN
250.0000 [IU] | Freq: Once | INTRAVENOUS | Status: AC
  Administered 2020-10-31: 250 [IU]
  Filled 2020-10-31: qty 5

## 2020-10-31 MED ORDER — SODIUM CHLORIDE 0.9% FLUSH
10.0000 mL | INTRAVENOUS | Status: DC | PRN
  Administered 2020-10-31: 10 mL
  Filled 2020-10-31: qty 10

## 2020-10-31 MED ORDER — SODIUM CHLORIDE 0.9 % IV SOLN
10.0000 mg | Freq: Once | INTRAVENOUS | Status: AC
  Administered 2020-10-31: 10 mg via INTRAVENOUS
  Filled 2020-10-31: qty 10

## 2020-10-31 MED ORDER — SODIUM CHLORIDE 0.9 % IV SOLN
75.0000 mg/m2 | Freq: Once | INTRAVENOUS | Status: AC
  Administered 2020-10-31: 155 mg via INTRAVENOUS
  Filled 2020-10-31: qty 15.5

## 2020-10-31 MED ORDER — SODIUM CHLORIDE 0.9% FLUSH
10.0000 mL | Freq: Once | INTRAVENOUS | Status: AC
  Administered 2020-10-31: 10 mL
  Filled 2020-10-31: qty 10

## 2020-10-31 MED ORDER — PALONOSETRON HCL INJECTION 0.25 MG/5ML
0.2500 mg | Freq: Once | INTRAVENOUS | Status: AC
  Administered 2020-10-31: 0.25 mg via INTRAVENOUS

## 2020-10-31 MED ORDER — PALONOSETRON HCL INJECTION 0.25 MG/5ML
INTRAVENOUS | Status: AC
  Filled 2020-10-31: qty 5

## 2020-10-31 MED ORDER — HEPARIN SOD (PORK) LOCK FLUSH 100 UNIT/ML IV SOLN
500.0000 [IU] | Freq: Once | INTRAVENOUS | Status: DC | PRN
  Filled 2020-10-31: qty 5

## 2020-10-31 NOTE — Patient Instructions (Signed)
[facility] Discharge Instructions for Patients Receiving Chemotherapy  Today you received the following chemotherapy agents: azacitidine.  To help prevent nausea and vomiting after your treatment, we encourage you to take your nausea medication as directed.   If you develop nausea and vomiting that is not controlled by your nausea medication, call the clinic.   BELOW ARE SYMPTOMS THAT SHOULD BE REPORTED IMMEDIATELY:  *FEVER GREATER THAN 100.5 F  *CHILLS WITH OR WITHOUT FEVER  NAUSEA AND VOMITING THAT IS NOT CONTROLLED WITH YOUR NAUSEA MEDICATION  *UNUSUAL SHORTNESS OF BREATH  *UNUSUAL BRUISING OR BLEEDING  TENDERNESS IN MOUTH AND THROAT WITH OR WITHOUT PRESENCE OF ULCERS  *URINARY PROBLEMS  *BOWEL PROBLEMS  UNUSUAL RASH Items with * indicate a potential emergency and should be followed up as soon as possible.  Feel free to call the clinic should you have any questions or concerns. The clinic phone number is [phone].  Please show the CHEMO ALERT CARD at check-in to the Emergency Department and triage nurse.   

## 2020-10-31 NOTE — Progress Notes (Signed)
  [facility] OFFICE PROGRESS NOTE   Diagnosis: AML  INTERVAL HISTORY:   Victor. Stary returns for a scheduled visit.  He feels well.  He reports persistent numbness in the right hand since being diagnosed with AML.  Left hand numbness has resolved.  No fever or bleeding.  He is scheduled begin the next cycle of 5 azacytidine and venetoclax today.  Objective:  Vital signs in last 24 hours:  Blood pressure (!) 124/59, pulse 76, temperature 98.1 F (36.7 C), temperature source Tympanic, resp. rate 18, height $RemoveBe'5\' 11"'YTdTBOlUP$  (1.803 m), weight 198 lb 6.4 oz (90 kg), SpO2 96 %.    HEENT: No thrush or ulcers Resp: Lungs clear bilaterally Cardio: Regular rate and rhythm GI: No hepatosplenomegaly Vascular: No leg edema Neuro: The right hand strength appears intact   Portacath/PICC-without erythema  Lab Results:  Lab Results  Component Value Date   WBC 3.1 (L) 10/31/2020   HGB 10.6 (L) 10/31/2020   HCT 32.5 (L) 10/31/2020   MCV 99.1 10/31/2020   PLT 53 (L) 10/31/2020   NEUTROABS 1.8 10/31/2020    CMP  Lab Results  Component Value Date   NA 142 10/31/2020   K 3.7 10/31/2020   CL 109 10/31/2020   CO2 28 10/31/2020   GLUCOSE 103 (H) 10/31/2020   BUN 12 10/31/2020   CREATININE 0.83 10/31/2020   CALCIUM 8.2 (L) 10/31/2020   PROT 5.2 (L) 10/31/2020   ALBUMIN 3.2 (L) 10/31/2020   AST 10 (L) 10/31/2020   ALT 9 10/31/2020   ALKPHOS 57 10/31/2020   BILITOT 0.5 10/31/2020   GFRNONAA >60 10/31/2020   GFRAA 52 (L) 08/06/2020    Medications: I have reviewed the patient's current medications.   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, FLT3-ITD mutation positive  Cycle 1 azacytidine and venetoclax August 09, 2020;venetoclax dose reduced to 200 mg beginning 08/22/2020  Bone marrow biopsy 09/01/2020-no increase in blast cells  Cycle 2 azacitidine and venetoclax September 19, 2020, venetoclax 400 mg daily 21 days, change to 200 mg daily when he began Diflucan/Levaquin prophylaxis  Cycle 3 azacytidine and venetoclax 10/31/2020  2.Pancytopenia secondary to #1 and chemotherapy 3.Right PICC placed August 08, 2020 4.TTE August 08, 2020-EF 60-65% 5.Fever during hospital admission October 2021, suspicion of pneumonia, treated with cefepime and discharged on Levaquin 6.Hypervolemia with respiratory failure during hospital admission October 2021-diuresis, home Lasix 7.Hypothyroidism secondary to thyroidectomy and radiation-thyroid hormone increased October 2021 8.Acute renal failure October 2021-improved    Disposition: Victor [name] appears stable.  The white count has recovered.  The plan is to begin cycle 3 azacytidine and venetoclax today.  The platelet count is lower.  He will return for a weekly CBC.  We communicated with the [facility], [facility], [facility] team and they recommend proceeding with this cycle of 5 azacytidine and increasing the venetoclax to 400 mg daily with discontinuation of acyclovir and Diflucan.  Victor Dershem will return for an office visit in 2 weeks.  Betsy Coder, MD  10/31/2020  4:53 PM

## 2020-10-31 NOTE — Progress Notes (Signed)
Per Dr. Linus Orn and Dr. Benay Spice: OK to treat with platelet count 53,000

## 2020-10-31 NOTE — Progress Notes (Signed)
Per Dr. Linus Orn at [facility]: OK to start cycle #3 Vidaza today and d/c fluconazole and Levaquin. Start Venetoclax at 400 mg/day and continue acyclovir.

## 2020-11-01 ENCOUNTER — Inpatient Hospital Stay: Payer: Medicare Other

## 2020-11-01 ENCOUNTER — Telehealth: Payer: Self-pay | Admitting: Oncology

## 2020-11-01 VITALS — BP 141/48 | HR 80 | Temp 98.2°F | Resp 18

## 2020-11-01 DIAGNOSIS — C9200 Acute myeloblastic leukemia, not having achieved remission: Secondary | ICD-10-CM | POA: Diagnosis not present

## 2020-11-01 DIAGNOSIS — C9500 Acute leukemia of unspecified cell type not having achieved remission: Secondary | ICD-10-CM

## 2020-11-01 MED ORDER — HEPARIN SOD (PORK) LOCK FLUSH 100 UNIT/ML IV SOLN
250.0000 [IU] | Freq: Once | INTRAVENOUS | Status: AC | PRN
  Administered 2020-11-01: 250 [IU]
  Filled 2020-11-01: qty 5

## 2020-11-01 MED ORDER — SODIUM CHLORIDE 0.9 % IV SOLN
Freq: Once | INTRAVENOUS | Status: AC
  Filled 2020-11-01: qty 250

## 2020-11-01 MED ORDER — SODIUM CHLORIDE 0.9% FLUSH
3.0000 mL | INTRAVENOUS | Status: DC | PRN
  Administered 2020-11-01: 3 mL
  Filled 2020-11-01: qty 10

## 2020-11-01 MED ORDER — SODIUM CHLORIDE 0.9 % IV SOLN
10.0000 mg | Freq: Once | INTRAVENOUS | Status: AC
  Administered 2020-11-01: 10 mg via INTRAVENOUS
  Filled 2020-11-01: qty 10

## 2020-11-01 MED ORDER — SODIUM CHLORIDE 0.9 % IV SOLN
75.0000 mg/m2 | Freq: Once | INTRAVENOUS | Status: AC
  Administered 2020-11-01: 155 mg via INTRAVENOUS
  Filled 2020-11-01: qty 15.5

## 2020-11-01 NOTE — Patient Instructions (Signed)
[facility] Discharge Instructions for Patients Receiving Chemotherapy  Today you received the following chemotherapy agents: azacitidine.  To help prevent nausea and vomiting after your treatment, we encourage you to take your nausea medication as directed.   If you develop nausea and vomiting that is not controlled by your nausea medication, call the clinic.   BELOW ARE SYMPTOMS THAT SHOULD BE REPORTED IMMEDIATELY:  *FEVER GREATER THAN 100.5 F  *CHILLS WITH OR WITHOUT FEVER  NAUSEA AND VOMITING THAT IS NOT CONTROLLED WITH YOUR NAUSEA MEDICATION  *UNUSUAL SHORTNESS OF BREATH  *UNUSUAL BRUISING OR BLEEDING  TENDERNESS IN MOUTH AND THROAT WITH OR WITHOUT PRESENCE OF ULCERS  *URINARY PROBLEMS  *BOWEL PROBLEMS  UNUSUAL RASH Items with * indicate a potential emergency and should be followed up as soon as possible.  Feel free to call the clinic should you have any questions or concerns. The clinic phone number is [phone].  Please show the CHEMO ALERT CARD at check-in to the Emergency Department and triage nurse.   

## 2020-11-01 NOTE — Telephone Encounter (Signed)
Scheduled appointments per 12/27 los. Spoke to patient who is aware of appointments dates and times.  

## 2020-11-02 ENCOUNTER — Inpatient Hospital Stay: Payer: Medicare Other

## 2020-11-02 ENCOUNTER — Other Ambulatory Visit: Payer: Self-pay

## 2020-11-02 VITALS — BP 135/52 | HR 64 | Temp 98.3°F | Resp 18

## 2020-11-02 DIAGNOSIS — C9200 Acute myeloblastic leukemia, not having achieved remission: Secondary | ICD-10-CM | POA: Diagnosis not present

## 2020-11-02 DIAGNOSIS — C9500 Acute leukemia of unspecified cell type not having achieved remission: Secondary | ICD-10-CM

## 2020-11-02 MED ORDER — DEXAMETHASONE SODIUM PHOSPHATE 100 MG/10ML IJ SOLN
10.0000 mg | Freq: Once | INTRAMUSCULAR | Status: AC
  Administered 2020-11-02: 10 mg via INTRAVENOUS
  Filled 2020-11-02: qty 10

## 2020-11-02 MED ORDER — SODIUM CHLORIDE 0.9% FLUSH
10.0000 mL | INTRAVENOUS | Status: DC | PRN
  Administered 2020-11-02: 10 mL
  Filled 2020-11-02: qty 10

## 2020-11-02 MED ORDER — PALONOSETRON HCL INJECTION 0.25 MG/5ML
INTRAVENOUS | Status: AC
  Filled 2020-11-02: qty 5

## 2020-11-02 MED ORDER — SODIUM CHLORIDE 0.9 % IV SOLN
75.0000 mg/m2 | Freq: Once | INTRAVENOUS | Status: AC
  Administered 2020-11-02: 155 mg via INTRAVENOUS
  Filled 2020-11-02: qty 15.5

## 2020-11-02 MED ORDER — PALONOSETRON HCL INJECTION 0.25 MG/5ML
0.2500 mg | Freq: Once | INTRAVENOUS | Status: AC
  Administered 2020-11-02: 0.25 mg via INTRAVENOUS

## 2020-11-02 MED ORDER — HEPARIN SOD (PORK) LOCK FLUSH 100 UNIT/ML IV SOLN
500.0000 [IU] | Freq: Once | INTRAVENOUS | Status: AC | PRN
  Administered 2020-11-02: 500 [IU]
  Filled 2020-11-02: qty 5

## 2020-11-02 MED ORDER — SODIUM CHLORIDE 0.9 % IV SOLN
Freq: Once | INTRAVENOUS | Status: AC
Start: 2020-11-02 — End: 2020-11-02
  Filled 2020-11-02: qty 250

## 2020-11-02 NOTE — Patient Instructions (Signed)
[facility] Discharge Instructions for Patients Receiving Chemotherapy  Today you received the following chemotherapy agents Vidaza  To help prevent nausea and vomiting after your treatment, we encourage you to take your nausea medication as directed  If you develop nausea and vomiting that is not controlled by your nausea medication, call the clinic.   BELOW ARE SYMPTOMS THAT SHOULD BE REPORTED IMMEDIATELY:  *FEVER GREATER THAN 100.5 F  *CHILLS WITH OR WITHOUT FEVER  NAUSEA AND VOMITING THAT IS NOT CONTROLLED WITH YOUR NAUSEA MEDICATION  *UNUSUAL SHORTNESS OF BREATH  *UNUSUAL BRUISING OR BLEEDING  TENDERNESS IN MOUTH AND THROAT WITH OR WITHOUT PRESENCE OF ULCERS  *URINARY PROBLEMS  *BOWEL PROBLEMS  UNUSUAL RASH Items with * indicate a potential emergency and should be followed up as soon as possible.  Feel free to call the clinic should you have any questions or concerns. The clinic phone number is [phone].  Please show the CHEMO ALERT CARD at check-in to the Emergency Department and triage nurse.   

## 2020-11-03 ENCOUNTER — Encounter: Payer: Self-pay | Admitting: Physical Therapy

## 2020-11-03 ENCOUNTER — Inpatient Hospital Stay: Payer: Medicare Other

## 2020-11-03 ENCOUNTER — Ambulatory Visit: Payer: Medicare Other | Admitting: Physical Therapy

## 2020-11-03 ENCOUNTER — Other Ambulatory Visit: Payer: Self-pay

## 2020-11-03 VITALS — BP 111/93 | HR 59 | Temp 97.8°F | Resp 17

## 2020-11-03 DIAGNOSIS — R2689 Other abnormalities of gait and mobility: Secondary | ICD-10-CM

## 2020-11-03 DIAGNOSIS — M25552 Pain in left hip: Secondary | ICD-10-CM

## 2020-11-03 DIAGNOSIS — M25551 Pain in right hip: Secondary | ICD-10-CM

## 2020-11-03 DIAGNOSIS — C9500 Acute leukemia of unspecified cell type not having achieved remission: Secondary | ICD-10-CM

## 2020-11-03 DIAGNOSIS — R296 Repeated falls: Secondary | ICD-10-CM

## 2020-11-03 DIAGNOSIS — C9200 Acute myeloblastic leukemia, not having achieved remission: Secondary | ICD-10-CM | POA: Diagnosis not present

## 2020-11-03 DIAGNOSIS — M6281 Muscle weakness (generalized): Secondary | ICD-10-CM | POA: Diagnosis not present

## 2020-11-03 MED ORDER — HEPARIN SOD (PORK) LOCK FLUSH 100 UNIT/ML IV SOLN
250.0000 [IU] | Freq: Once | INTRAVENOUS | Status: AC | PRN
Start: 2020-11-03 — End: 2020-11-03
  Administered 2020-11-03: 250 [IU]
  Filled 2020-11-03: qty 5

## 2020-11-03 MED ORDER — SODIUM CHLORIDE 0.9 % IV SOLN
10.0000 mg | Freq: Once | INTRAVENOUS | Status: AC
  Administered 2020-11-03: 10 mg via INTRAVENOUS
  Filled 2020-11-03: qty 10

## 2020-11-03 MED ORDER — AZACITIDINE CHEMO INJECTION 100 MG
75.0000 mg/m2 | Freq: Once | INTRAMUSCULAR | Status: AC
  Administered 2020-11-03: 155 mg via INTRAVENOUS
  Filled 2020-11-03: qty 15.5

## 2020-11-03 MED ORDER — SODIUM CHLORIDE 0.9% FLUSH
10.0000 mL | INTRAVENOUS | Status: DC | PRN
  Administered 2020-11-03: 10 mL
  Filled 2020-11-03: qty 10

## 2020-11-03 MED ORDER — SODIUM CHLORIDE 0.9 % IV SOLN
Freq: Once | INTRAVENOUS | Status: AC
  Filled 2020-11-03: qty 250

## 2020-11-03 NOTE — Patient Instructions (Signed)
[facility] Discharge Instructions for Patients Receiving Chemotherapy  Today you received the following chemotherapy agents: vidza  To help prevent nausea and vomiting after your treatment, we encourage you to take your nausea medication as directed.    If you develop nausea and vomiting that is not controlled by your nausea medication, call the clinic.   BELOW ARE SYMPTOMS THAT SHOULD BE REPORTED IMMEDIATELY:  *FEVER GREATER THAN 100.5 F  *CHILLS WITH OR WITHOUT FEVER  NAUSEA AND VOMITING THAT IS NOT CONTROLLED WITH YOUR NAUSEA MEDICATION  *UNUSUAL SHORTNESS OF BREATH  *UNUSUAL BRUISING OR BLEEDING  TENDERNESS IN MOUTH AND THROAT WITH OR WITHOUT PRESENCE OF ULCERS  *URINARY PROBLEMS  *BOWEL PROBLEMS  UNUSUAL RASH Items with * indicate a potential emergency and should be followed up as soon as possible.  Feel free to call the clinic should you have any questions or concerns. The clinic phone number is [phone].  Please show the [facility] at check-in to the Emergency Department and triage nurse.

## 2020-11-03 NOTE — Therapy (Signed)
[place]. [place], [place], [address] Phone: [phone]   Fax:  [phone]  Physical Therapy Treatment  Patient Details  Name: Victor [name] MRN: [id number] Date of Birth: 1936/03/21 Referring Provider (PT): Clinton Quant Date: 11/03/2020   PT End of Session - 11/03/20 1012    Visit Number 5    Number of Visits 16    Date for PT Re-Evaluation 12/12/20    Authorization Type BCBS    PT Start Time 0925    PT Stop Time 1013    PT Time Calculation (min) 48 min    Activity Tolerance Patient tolerated treatment well    Behavior During Therapy [facility] for tasks assessed/performed           Past Medical History:  Diagnosis Date  . Arthritis   . Atherosclerosis   . Atherosclerotic PVD with intermittent claudication (HCC)    stent left leg  . BPH (benign prostatic hyperplasia)   . Bulging lumbar disc   . Cancer ([place]) 11/12/14   vocal cord  carcinoma in situ , radiation; thyroid cancer  . Carotid bruit   . Chronic kidney disease    chronic stage III  . COPD (chronic obstructive pulmonary disease) ([place])   . DDD (degenerative disc disease), lumbar   . Dysphonia   . Dysplasia of true vocal cord   . GERD (gastroesophageal reflux disease)   . H/O carotid atherosclerosis    b/l  . Hyperlipidemia   . Hypothyroidism   . Occasional tremors    left hand managed with propranolol  . Peripheral vascular angioplasty status with implants and grafts   . Precancerous lesion 03/05/2018   premelanoma removed from back.   . Radiation 03/10/15- 04/18/16   Larynx  . Sleep apnea    hasn't used CPAP in years  . Spondylosis of lumbosacral region   . Thyroid disease     Past Surgical History:  Procedure Laterality Date  . APPENDECTOMY    . COLONOSCOPY W/ POLYPECTOMY    . DIODE LASER APPLICATION Left 67/20/9470   Procedure: DIODE LASER APPLICATION;  Surgeon: Hayden Pedro, MD;  Location: [place];  Service:  Ophthalmology;  Laterality: Left;  . MEMBRANE PEEL Left 08/18/2019   Procedure: MEMBRANE PEEL;  Surgeon: Hayden Pedro, MD;  Location: [place];  Service: Ophthalmology;  Laterality: Left;  . MOUTH SURGERY     tooth extraction  . PARS PLANA VITRECTOMY Left 08/18/2019   PARS PLANA VITRECTOMY 27 GAUGE (Left)  . PARS PLANA VITRECTOMY 27 GAUGE Left 08/18/2019   Procedure: PARS PLANA VITRECTOMY 27 GAUGE;  Surgeon: Hayden Pedro, MD;  Location: [place];  Service: Ophthalmology;  Laterality: Left;  . PHOTOCOAGULATION WITH LASER Left 08/18/2019   Procedure: PHOTOCOAGULATION WITH LASER;  Surgeon: Hayden Pedro, MD;  Location: [place];  Service: Ophthalmology;  Laterality: Left;  . THYROID SURGERY    . TONSILLECTOMY    . vocal cord biopsy  11/12/14   squamous cell carcinoma in situ    There were no vitals filed for this visit.   Subjective Assessment - 11/03/20 0934    Subjective I got some new shoes, my legs feel weak    Currently in Pain? No/denies                             [facility] Adult PT Treatment/Exercise - 11/03/20 0001  High Level Balance   High Level Balance Comments walking ball toss and direction changes, airex balance beam walking tandem and side stepping, minitramp walking      Knee/Hip Exercises: Aerobic   Tread Mill 1.19mph x 7 minutes    Nustep level 5 x 6 minutes      Knee/Hip Exercises: Machines for Strengthening   Cybex Knee Extension 10# 3x10    Cybex Knee Flexion 25# 3x10      Knee/Hip Exercises: Standing   Walking with Sports Cord all directions      Knee/Hip Exercises: Seated   Other Seated Knee/Hip Exercises sit to stand with 6# at chest sx5 the second set of 5 he needed assist                    PT Short Term Goals - 10/25/20 1534      PT SHORT TERM GOAL #1   Title Pt will be independent and compliant with initial HEP.    Status Achieved             PT Long Term Goals - 11/03/20 1016      PT LONG TERM GOAL #1    Title incresae hip strength ti 4/5    Status On-going      PT LONG TERM GOAL #2   Title increase UE strength to 4+/5    Status On-going                 Plan - 11/03/20 1014    Clinical Impression Statement Patient with reports of fatigue in the legs and feeling weak, he was not in over the past week due to holiday and chemo treatment.  He c/o fatigue with the tmill, struggles with the tandem walk and stance.  Needed assist with getting up ithout hands on the second set of 5    PT Next Visit Plan Will work on strength, balance and function    Consulted and Agree with Plan of Care Patient           Patient will benefit from skilled therapeutic intervention in order to improve the following deficits and impairments:  Decreased balance,Decreased mobility,Decreased strength,Postural dysfunction,Improper body mechanics,Impaired flexibility,Hypomobility,Decreased range of motion,Increased fascial restricitons,Dizziness,Decreased endurance,Cardiopulmonary status limiting activity,Decreased activity tolerance  Visit Diagnosis: Muscle weakness (generalized)  Other abnormalities of gait and mobility  Repeated falls  Pain in left hip  Pain in right hip     Problem List Patient Active Problem List   Diagnosis Date Noted  . Closed fracture of one rib of right side 10/04/2020  . Goals of care, counseling/discussion 09/05/2020  . PICC (peripherally inserted central catheter) in place 08/22/2020  . 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 Thrombocytopenia ([place]) 08/05/2020  . Anemia 08/05/2020  . AKI (acute kidney injury) ([place]) 08/05/2020  . Hematologic malignancy ([place]) 08/05/2020  . Acute leukemia ([place]) 08/05/2020  . Hypothyroidism 08/05/2020  . Malaise and fatigue 08/04/2020  . Obesity (BMI 30-39.9) 08/04/2020  . COPD, moderate ([place]) 05/24/2020  . Bilateral hip pain 05/04/2020  . Plantar fasciitis of left foot 03/30/2020  . Chronic left SI joint pain 03/01/2020  . Shortness of  breath 10/01/2019  . Former smoker 10/01/2019  . Healthcare maintenance 10/01/2019  . Epiretinal membrane (ERM) of left eye 07/22/2019  . Centrilobular emphysema ([place]) 07/02/2019  . Pharyngeal dysphagia 04/07/2019  . Cervical radiculopathy 03/16/2019  . 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 Lumbar radiculopathy 04/16/2017  . Rotator cuff syndrome of left shoulder  04/16/2017  . Trochanteric bursitis, left hip 04/16/2017  . Atherosclerosis of artery of both lower extremities ([place]) 11/14/2016  . Bilateral carotid artery stenosis 11/14/2016  . Vitamin D deficiency 07/31/2016  . Facet arthritis of lumbar region 09/26/2015  . 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  . History of thyroid cancer 03/26/2014  . Benign prostatic hyperplasia with urinary obstruction 03/23/2014  . Dysphonia 03/17/2014  . Gastro-esophageal reflux disease without esophagitis 03/17/2014  . Hyperlipidemia 03/17/2014  . Other intervertebral disc degeneration, lumbar region 03/17/2014  . PAD (peripheral artery disease) ([place]) 03/17/2014  . Postoperative hypothyroidism 03/17/2014  . Chronic kidney disease, stage 2 (mild) 03/17/2014  . Occasional tremors 03/17/2014    Sumner Boast., PT 11/03/2020, 10:17 AM  [place]. [place], [place], [address] Phone: [phone]   Fax:  [phone]  Name: Victor [name] MRN: [id number] Date of Birth: [birth year]

## 2020-11-04 ENCOUNTER — Other Ambulatory Visit: Payer: Self-pay

## 2020-11-04 ENCOUNTER — Inpatient Hospital Stay: Payer: Medicare Other

## 2020-11-04 VITALS — BP 123/61 | HR 68 | Temp 97.1°F | Resp 18

## 2020-11-04 DIAGNOSIS — C9500 Acute leukemia of unspecified cell type not having achieved remission: Secondary | ICD-10-CM

## 2020-11-04 DIAGNOSIS — C9200 Acute myeloblastic leukemia, not having achieved remission: Secondary | ICD-10-CM | POA: Diagnosis not present

## 2020-11-04 MED ORDER — HEPARIN SOD (PORK) LOCK FLUSH 100 UNIT/ML IV SOLN
500.0000 [IU] | Freq: Once | INTRAVENOUS | Status: AC | PRN
  Administered 2020-11-04: 500 [IU]
  Filled 2020-11-04: qty 5

## 2020-11-04 MED ORDER — PALONOSETRON HCL INJECTION 0.25 MG/5ML
INTRAVENOUS | Status: AC
  Filled 2020-11-04: qty 5

## 2020-11-04 MED ORDER — SODIUM CHLORIDE 0.9 % IV SOLN
75.0000 mg/m2 | Freq: Once | INTRAVENOUS | Status: AC
  Administered 2020-11-04: 155 mg via INTRAVENOUS
  Filled 2020-11-04: qty 15.5

## 2020-11-04 MED ORDER — SODIUM CHLORIDE 0.9% FLUSH
10.0000 mL | INTRAVENOUS | Status: DC | PRN
  Administered 2020-11-04: 10 mL
  Filled 2020-11-04: qty 10

## 2020-11-04 MED ORDER — PALONOSETRON HCL INJECTION 0.25 MG/5ML
0.2500 mg | Freq: Once | INTRAVENOUS | Status: AC
  Administered 2020-11-04: 0.25 mg via INTRAVENOUS

## 2020-11-04 MED ORDER — SODIUM CHLORIDE 0.9 % IV SOLN
Freq: Once | INTRAVENOUS | Status: AC
  Filled 2020-11-04: qty 250

## 2020-11-04 MED ORDER — SODIUM CHLORIDE 0.9 % IV SOLN
10.0000 mg | Freq: Once | INTRAVENOUS | Status: AC
  Administered 2020-11-04: 10 mg via INTRAVENOUS
  Filled 2020-11-04: qty 10

## 2020-11-04 NOTE — Patient Instructions (Signed)
[facility] Discharge Instructions for Patients Receiving Chemotherapy  Today you received the following chemotherapy agents Vidaza  To help prevent nausea and vomiting after your treatment, we encourage you to take your nausea medication as directed  If you develop nausea and vomiting that is not controlled by your nausea medication, call the clinic.   BELOW ARE SYMPTOMS THAT SHOULD BE REPORTED IMMEDIATELY:  *FEVER GREATER THAN 100.5 F  *CHILLS WITH OR WITHOUT FEVER  NAUSEA AND VOMITING THAT IS NOT CONTROLLED WITH YOUR NAUSEA MEDICATION  *UNUSUAL SHORTNESS OF BREATH  *UNUSUAL BRUISING OR BLEEDING  TENDERNESS IN MOUTH AND THROAT WITH OR WITHOUT PRESENCE OF ULCERS  *URINARY PROBLEMS  *BOWEL PROBLEMS  UNUSUAL RASH Items with * indicate a potential emergency and should be followed up as soon as possible.  Feel free to call the clinic should you have any questions or concerns. The clinic phone number is [phone].  Please show the CHEMO ALERT CARD at check-in to the Emergency Department and triage nurse.   

## 2020-11-07 ENCOUNTER — Other Ambulatory Visit: Payer: Self-pay

## 2020-11-07 ENCOUNTER — Inpatient Hospital Stay: Payer: Medicare Other

## 2020-11-07 ENCOUNTER — Encounter: Payer: Self-pay | Admitting: Family Medicine

## 2020-11-07 ENCOUNTER — Inpatient Hospital Stay: Payer: Medicare Other | Attending: Oncology

## 2020-11-07 VITALS — BP 146/63 | HR 66 | Temp 97.8°F | Resp 18 | Wt 197.0 lb

## 2020-11-07 DIAGNOSIS — D61818 Other pancytopenia: Secondary | ICD-10-CM | POA: Diagnosis not present

## 2020-11-07 DIAGNOSIS — C9200 Acute myeloblastic leukemia, not having achieved remission: Secondary | ICD-10-CM | POA: Insufficient documentation

## 2020-11-07 DIAGNOSIS — E8779 Other fluid overload: Secondary | ICD-10-CM | POA: Insufficient documentation

## 2020-11-07 DIAGNOSIS — D709 Neutropenia, unspecified: Secondary | ICD-10-CM | POA: Insufficient documentation

## 2020-11-07 DIAGNOSIS — Z79899 Other long term (current) drug therapy: Secondary | ICD-10-CM | POA: Diagnosis not present

## 2020-11-07 DIAGNOSIS — Z452 Encounter for adjustment and management of vascular access device: Secondary | ICD-10-CM

## 2020-11-07 DIAGNOSIS — C9500 Acute leukemia of unspecified cell type not having achieved remission: Secondary | ICD-10-CM

## 2020-11-07 DIAGNOSIS — E890 Postprocedural hypothyroidism: Secondary | ICD-10-CM | POA: Diagnosis not present

## 2020-11-07 DIAGNOSIS — R58 Hemorrhage, not elsewhere classified: Secondary | ICD-10-CM | POA: Diagnosis not present

## 2020-11-07 DIAGNOSIS — J9690 Respiratory failure, unspecified, unspecified whether with hypoxia or hypercapnia: Secondary | ICD-10-CM | POA: Diagnosis not present

## 2020-11-07 DIAGNOSIS — N179 Acute kidney failure, unspecified: Secondary | ICD-10-CM | POA: Insufficient documentation

## 2020-11-07 DIAGNOSIS — Z5111 Encounter for antineoplastic chemotherapy: Secondary | ICD-10-CM | POA: Insufficient documentation

## 2020-11-07 LAB — CMP (CANCER CENTER ONLY)
ALT: 17 U/L (ref 0–44)
AST: 14 U/L — ABNORMAL LOW (ref 15–41)
Albumin: 2.9 g/dL — ABNORMAL LOW (ref 3.5–5.0)
Alkaline Phosphatase: 48 U/L (ref 38–126)
Anion gap: 4 — ABNORMAL LOW (ref 5–15)
BUN: 24 mg/dL — ABNORMAL HIGH (ref 8–23)
CO2: 28 mmol/L (ref 22–32)
Calcium: 7.7 mg/dL — ABNORMAL LOW (ref 8.9–10.3)
Chloride: 108 mmol/L (ref 98–111)
Creatinine: 1 mg/dL (ref 0.61–1.24)
GFR, Estimated: >60 mL/min (ref >60)
Glucose, Bld: 113 mg/dL — ABNORMAL HIGH (ref 70–99)
Potassium: 3.7 mmol/L (ref 3.5–5.1)
Sodium: 140 mmol/L (ref 135–145)
Total Bilirubin: 1 mg/dL (ref 0.3–1.2)
Total Protein: 4.7 g/dL — ABNORMAL LOW (ref 6.5–8.1)

## 2020-11-07 LAB — CBC WITH DIFFERENTIAL (CANCER CENTER ONLY)
Abs Immature Granulocytes: 0.03 10*3/uL (ref 0.00–0.07)
Basophils Absolute: 0 10*3/uL (ref 0.0–0.1)
Basophils Relative: 0 %
Eosinophils Absolute: 0 10*3/uL (ref 0.0–0.5)
Eosinophils Relative: 0 %
HCT: 34.3 % — ABNORMAL LOW (ref 39.0–52.0)
Hemoglobin: 11.6 g/dL — ABNORMAL LOW (ref 13.0–17.0)
Immature Granulocytes: 1 %
Lymphocytes Relative: 29 %
Lymphs Abs: 1 10*3/uL (ref 0.7–4.0)
MCH: 33.2 pg (ref 26.0–34.0)
MCHC: 33.8 g/dL (ref 30.0–36.0)
MCV: 98.3 fL (ref 80.0–100.0)
Monocytes Absolute: 0.2 10*3/uL (ref 0.1–1.0)
Monocytes Relative: 5 %
Neutro Abs: 2.2 10*3/uL (ref 1.7–7.7)
Neutrophils Relative %: 65 %
Platelet Count: 41 10*3/uL — ABNORMAL LOW (ref 150–400)
RBC: 3.49 MIL/uL — ABNORMAL LOW (ref 4.22–5.81)
RDW: 19.3 % — ABNORMAL HIGH (ref 11.5–15.5)
WBC Count: 3.3 10*3/uL — ABNORMAL LOW (ref 4.0–10.5)
nRBC: 0 % (ref 0.0–0.2)

## 2020-11-07 LAB — MAGNESIUM: Magnesium: 2 mg/dL (ref 1.7–2.4)

## 2020-11-07 MED ORDER — PALONOSETRON HCL INJECTION 0.25 MG/5ML
INTRAVENOUS | Status: AC
  Filled 2020-11-07: qty 5

## 2020-11-07 MED ORDER — SODIUM CHLORIDE 0.9 % IV SOLN
Freq: Once | INTRAVENOUS | Status: AC
Start: 2020-11-07 — End: 2020-11-07
  Filled 2020-11-07: qty 250

## 2020-11-07 MED ORDER — SODIUM CHLORIDE 0.9% FLUSH
10.0000 mL | Freq: Once | INTRAVENOUS | Status: DC
  Filled 2020-11-07: qty 10

## 2020-11-07 MED ORDER — SODIUM CHLORIDE 0.9 % IV SOLN
10.0000 mg | Freq: Once | INTRAVENOUS | Status: AC
  Administered 2020-11-07: 10 mg via INTRAVENOUS
  Filled 2020-11-07: qty 10

## 2020-11-07 MED ORDER — SODIUM CHLORIDE 0.9 % IV SOLN
75.0000 mg/m2 | Freq: Once | INTRAVENOUS | Status: AC
  Administered 2020-11-07: 155 mg via INTRAVENOUS
  Filled 2020-11-07: qty 15.5

## 2020-11-07 MED ORDER — PALONOSETRON HCL INJECTION 0.25 MG/5ML
0.2500 mg | Freq: Once | INTRAVENOUS | Status: AC
  Administered 2020-11-07: 0.25 mg via INTRAVENOUS

## 2020-11-07 MED ORDER — HEPARIN SOD (PORK) LOCK FLUSH 100 UNIT/ML IV SOLN
250.0000 [IU] | Freq: Once | INTRAVENOUS | Status: AC | PRN
  Administered 2020-11-07: 250 [IU]
  Filled 2020-11-07: qty 5

## 2020-11-07 MED ORDER — SODIUM CHLORIDE 0.9% FLUSH
10.0000 mL | INTRAVENOUS | Status: DC | PRN
  Administered 2020-11-07: 10 mL
  Filled 2020-11-07: qty 10

## 2020-11-07 NOTE — Patient Instructions (Signed)
PICC Home Care Guide  A peripherally inserted central catheter (PICC) is a form of IV access that allows medicines and IV fluids to be quickly distributed throughout the body. The PICC is a long, thin, flexible tube (catheter) that is inserted into a vein in the upper arm. The catheter ends in a large vein in the chest (superior vena cava, or SVC). After the PICC is inserted, a chest X-ray may be done to make sure that it is in the correct place. A PICC may be placed for different reasons, such as:  To give medicines and liquid nutrition.  To give IV fluids and blood products.  If there is trouble placing a peripheral intravenous (PIV) catheter. If taken care of properly, a PICC can remain in place for several months. Having a PICC can also allow a person to go home from the hospital sooner. Medicine and PICC care can be managed at home by a family member, caregiver, or home health care team. What are the risks? Generally, having a PICC is safe. However, problems may occur, including:  A blood clot (thrombus) forming in or at the tip of the PICC.  A blood clot forming in a vein (deep vein thrombosis) or traveling to the lung (pulmonary embolism).  Inflammation of the vein (phlebitis) in which the PICC is placed.  Infection. Central line associated blood stream infection (CLABSI) is a serious infection that often requires hospitalization.  PICC movement (malposition). The PICC tip may move from its original position due to excessive physical activity, forceful coughing, sneezing, or vomiting.  A break or cut in the PICC. It is important not to use scissors near the PICC.  Nerve or tendon irritation or injury during PICC insertion. How to take care of your PICC Preventing problems  You and any caregivers should wash your hands often with soap. Wash hands: ? Before touching the PICC line or the infusion device. ? Before changing a bandage (dressing).  Flush the PICC as told by your  health care provider. Let your health care provider know right away if the PICC is hard to flush or does not flush. Do not use force to flush the PICC.  Do not use a syringe that is less than 10 mL to flush the PICC.  Avoid blood pressure checks on the arm in which the PICC is placed.  Never pull or tug on the PICC.  Do not take the PICC out yourself. Only a trained clinical professional should remove the PICC.  Use clean and sterile supplies only. Keep the supplies in a dry place. Do not reuse needles, syringes, or any other supplies. Doing that can lead to infection.  Keep pets and children away from your PICC line.  Check the PICC insertion site every day for signs of infection. Check for: ? Leakage. ? Redness, swelling, or pain. ? Fluid or blood. ? Warmth. ? Pus or a bad smell. PICC dressing care  Keep your PICC bandage (dressing) clean and dry to prevent infection.  Do not take baths, swim, or use a hot tub until your health care provider approves. Ask your health care provider if you can take showers. You may only be allowed to take sponge baths for bathing. When you are allowed to shower: ? Ask your health care provider to teach you how to wrap the PICC line. ? Cover the PICC line with clear plastic wrap and tape to keep it dry while showering.  Follow instructions from your health care provider   about how to take care of your insertion site and dressing. Make sure you: ? Wash your hands with soap and water before you change your bandage (dressing). If soap and water are not available, use hand sanitizer. ? Change your dressing as told by your health care provider. ? Leave stitches (sutures), skin glue, or adhesive strips in place. These skin closures may need to stay in place for 2 weeks or longer. If adhesive strip edges start to loosen and curl up, you may trim the loose edges. Do not remove adhesive strips completely unless your health care provider tells you to do  that.  Change your PICC dressing if it becomes loose or wet. General instructions   Carry your PICC identification card or wear a medical alert bracelet at all times.  Keep the tube clamped at all times, unless it is being used.  Carry a smooth-edge clamp with you at all times to place on the tube if it breaks.  Do not use scissors or sharp objects near the tube.  You may bend your arm and move it freely. If your PICC is near or at the bend of your elbow, avoid activity with repeated motion at the elbow.  Avoid lifting heavy objects as told by your health care provider.  Keep all follow-up visits as told by your health care provider. This is important. Disposal of supplies  Throw away any syringes in a disposal container that is meant for sharp items (sharps container). You can buy a sharps container from a pharmacy, or you can make one by using an empty hard plastic bottle with a cover.  Place any used dressings or infusion bags into a plastic bag. Throw that bag in the trash. Contact a health care provider if:  You have pain in your arm, ear, face, or teeth.  You have a fever or chills.  You have redness, swelling, or pain around the insertion site.  You have fluid or blood coming from the insertion site.  Your insertion site feels warm to the touch.  You have pus or a bad smell coming from the insertion site.  Your skin feels hard and raised around the insertion site. Get help right away if:  Your PICC is accidentally pulled all the way out. If this happens, cover the insertion site with a bandage or gauze dressing. Do not throw the PICC away. Your health care provider will need to check it.  Your PICC was tugged or pulled and has partially come out. Do not  push the PICC back in.  You cannot flush the PICC, it is hard to flush, or the PICC leaks around the insertion site when it is flushed.  You hear a "flushing" sound when the PICC is flushed.  You feel your  heart racing or skipping beats.  There is a hole or tear in the PICC.  You have swelling in the arm in which the PICC was inserted.  You have a red streak going up your arm from where the PICC was inserted. Summary  A peripherally inserted central catheter (PICC) is a long, thin, flexible tube (catheter) that is inserted into a vein in the upper arm.  The PICC is inserted using a sterile technique by a specially trained nurse or physician. Only a trained clinical professional should remove it.  Keep your PICC identification card with you at all times.  Avoid blood pressure checks on the arm in which the PICC is placed.  If cared for   properly, a PICC can remain in place for several months. Having a PICC can also allow a person to go home from the hospital sooner. This information is not intended to replace advice given to you by your health care provider. Make sure you discuss any questions you have with your health care provider. Document Revised: 10/04/2017 Document Reviewed: 11/24/2016 Elsevier Patient Education  2020 [employer]  

## 2020-11-07 NOTE — Patient Instructions (Signed)
[facility] Discharge Instructions for Patients Receiving Chemotherapy  Today you received the following chemotherapy agents Vidaza  To help prevent nausea and vomiting after your treatment, we encourage you to take your nausea medication as directed  If you develop nausea and vomiting that is not controlled by your nausea medication, call the clinic.   BELOW ARE SYMPTOMS THAT SHOULD BE REPORTED IMMEDIATELY:  *FEVER GREATER THAN 100.5 F  *CHILLS WITH OR WITHOUT FEVER  NAUSEA AND VOMITING THAT IS NOT CONTROLLED WITH YOUR NAUSEA MEDICATION  *UNUSUAL SHORTNESS OF BREATH  *UNUSUAL BRUISING OR BLEEDING  TENDERNESS IN MOUTH AND THROAT WITH OR WITHOUT PRESENCE OF ULCERS  *URINARY PROBLEMS  *BOWEL PROBLEMS  UNUSUAL RASH Items with * indicate a potential emergency and should be followed up as soon as possible.  Feel free to call the clinic should you have any questions or concerns. The clinic phone number is [phone].  Please show the CHEMO ALERT CARD at check-in to the Emergency Department and triage nurse.   

## 2020-11-07 NOTE — Progress Notes (Signed)
Per Dr. Benay Spice, ok to treat with platelets 41.

## 2020-11-08 ENCOUNTER — Encounter: Payer: Self-pay | Admitting: Family Medicine

## 2020-11-08 ENCOUNTER — Other Ambulatory Visit: Payer: Medicare Other

## 2020-11-08 ENCOUNTER — Inpatient Hospital Stay: Payer: Medicare Other

## 2020-11-08 ENCOUNTER — Ambulatory Visit: Payer: Medicare Other

## 2020-11-08 VITALS — BP 142/58 | HR 66 | Temp 97.9°F | Resp 16

## 2020-11-08 DIAGNOSIS — C9200 Acute myeloblastic leukemia, not having achieved remission: Secondary | ICD-10-CM | POA: Diagnosis not present

## 2020-11-08 DIAGNOSIS — C9500 Acute leukemia of unspecified cell type not having achieved remission: Secondary | ICD-10-CM

## 2020-11-08 MED ORDER — SODIUM CHLORIDE 0.9 % IV SOLN
75.0000 mg/m2 | Freq: Once | INTRAVENOUS | Status: AC
  Administered 2020-11-08: 155 mg via INTRAVENOUS
  Filled 2020-11-08: qty 15.5

## 2020-11-08 MED ORDER — SODIUM CHLORIDE 0.9% FLUSH
10.0000 mL | INTRAVENOUS | Status: DC | PRN
  Administered 2020-11-08: 10 mL
  Filled 2020-11-08: qty 10

## 2020-11-08 MED ORDER — HEPARIN SOD (PORK) LOCK FLUSH 100 UNIT/ML IV SOLN
500.0000 [IU] | Freq: Once | INTRAVENOUS | Status: AC | PRN
  Administered 2020-11-08: 500 [IU]
  Filled 2020-11-08: qty 5

## 2020-11-08 MED ORDER — SODIUM CHLORIDE 0.9 % IV SOLN
10.0000 mg | Freq: Once | INTRAVENOUS | Status: AC
  Administered 2020-11-08: 10 mg via INTRAVENOUS
  Filled 2020-11-08: qty 10

## 2020-11-08 MED ORDER — SODIUM CHLORIDE 0.9 % IV SOLN
Freq: Once | INTRAVENOUS | Status: AC
Start: 2020-11-08 — End: 2020-11-08
  Filled 2020-11-08: qty 250

## 2020-11-08 NOTE — Progress Notes (Signed)
Per Dr. Benay Spice pt. to take Venetoclax for 14 more days. I called Aram Beecham and left a voice message with instructions and stated to call for questions or concerns.

## 2020-11-08 NOTE — Patient Instructions (Signed)
[facility] Discharge Instructions for Patients Receiving Chemotherapy  Today you received the following chemotherapy agent: Azacitidine (Vidaza)  To help prevent nausea and vomiting after your treatment, we encourage you to take your nausea medication as directed by your MD.   If you develop nausea and vomiting that is not controlled by your nausea medication, call the clinic.   BELOW ARE SYMPTOMS THAT SHOULD BE REPORTED IMMEDIATELY:  *FEVER GREATER THAN 100.5 F  *CHILLS WITH OR WITHOUT FEVER  NAUSEA AND VOMITING THAT IS NOT CONTROLLED WITH YOUR NAUSEA MEDICATION  *UNUSUAL SHORTNESS OF BREATH  *UNUSUAL BRUISING OR BLEEDING  TENDERNESS IN MOUTH AND THROAT WITH OR WITHOUT PRESENCE OF ULCERS  *URINARY PROBLEMS  *BOWEL PROBLEMS  UNUSUAL RASH Items with * indicate a potential emergency and should be followed up as soon as possible.  Feel free to call the clinic should you have any questions or concerns. The clinic phone number is [phone].  Please show the CHEMO ALERT CARD at check-in to the Emergency Department and triage nurse.   

## 2020-11-09 ENCOUNTER — Other Ambulatory Visit: Payer: Medicare Other

## 2020-11-09 ENCOUNTER — Ambulatory Visit: Payer: Medicare Other

## 2020-11-09 ENCOUNTER — Inpatient Hospital Stay: Payer: Medicare Other

## 2020-11-10 ENCOUNTER — Ambulatory Visit: Payer: Medicare Other | Attending: Orthopedic Surgery | Admitting: Physical Therapy

## 2020-11-10 ENCOUNTER — Encounter: Payer: Self-pay | Admitting: Physical Therapy

## 2020-11-10 ENCOUNTER — Other Ambulatory Visit: Payer: Self-pay

## 2020-11-10 DIAGNOSIS — M25552 Pain in left hip: Secondary | ICD-10-CM | POA: Insufficient documentation

## 2020-11-10 DIAGNOSIS — M25551 Pain in right hip: Secondary | ICD-10-CM

## 2020-11-10 DIAGNOSIS — M6281 Muscle weakness (generalized): Secondary | ICD-10-CM | POA: Diagnosis present

## 2020-11-10 DIAGNOSIS — R296 Repeated falls: Secondary | ICD-10-CM | POA: Diagnosis present

## 2020-11-10 DIAGNOSIS — R2689 Other abnormalities of gait and mobility: Secondary | ICD-10-CM

## 2020-11-10 NOTE — Therapy (Signed)
[place]. [place], [place], [address] Phone: [phone]   Fax:  [phone]  Physical Therapy Treatment  Patient Details  Name: Victor [name] MRN: [id number] Date of Birth: [birth year] Referring Provider (PT): Clinton Quant Date: 11/10/2020   PT End of Session - 11/10/20 8182    Visit Number 6    Date for PT Re-Evaluation 12/12/20    Authorization Type BCBS    PT Start Time 1528    PT Stop Time 1611    PT Time Calculation (min) 43 min    Activity Tolerance Patient tolerated treatment well    Behavior During Therapy [facility] for tasks assessed/performed           Past Medical History:  Diagnosis Date  . Arthritis   . Atherosclerosis   . Atherosclerotic PVD with intermittent claudication (HCC)    stent left leg  . BPH (benign prostatic hyperplasia)   . Bulging lumbar disc   . Cancer ([place]) 11/12/14   vocal cord  carcinoma in situ , radiation; thyroid cancer  . Carotid bruit   . Chronic kidney disease    chronic stage III  . COPD (chronic obstructive pulmonary disease) ([place])   . DDD (degenerative disc disease), lumbar   . Dysphonia   . Dysplasia of true vocal cord   . GERD (gastroesophageal reflux disease)   . H/O carotid atherosclerosis    b/l  . Hyperlipidemia   . Hypothyroidism   . Occasional tremors    left hand managed with propranolol  . Peripheral vascular angioplasty status with implants and grafts   . Precancerous lesion 03/05/2018   premelanoma removed from back.   . Radiation 03/10/15- 04/18/16   Larynx  . Sleep apnea    hasn't used CPAP in years  . Spondylosis of lumbosacral region   . Thyroid disease     Past Surgical History:  Procedure Laterality Date  . APPENDECTOMY    . COLONOSCOPY W/ POLYPECTOMY    . DIODE LASER APPLICATION Left 99/37/1696   Procedure: DIODE LASER APPLICATION;  Surgeon: Hayden Pedro, MD;  Location: [place];  Service: Ophthalmology;  Laterality: Left;  .  MEMBRANE PEEL Left 08/18/2019   Procedure: MEMBRANE PEEL;  Surgeon: Hayden Pedro, MD;  Location: [place];  Service: Ophthalmology;  Laterality: Left;  . MOUTH SURGERY     tooth extraction  . PARS PLANA VITRECTOMY Left 08/18/2019   PARS PLANA VITRECTOMY 27 GAUGE (Left)  . PARS PLANA VITRECTOMY 27 GAUGE Left 08/18/2019   Procedure: PARS PLANA VITRECTOMY 27 GAUGE;  Surgeon: Hayden Pedro, MD;  Location: [place];  Service: Ophthalmology;  Laterality: Left;  . PHOTOCOAGULATION WITH LASER Left 08/18/2019   Procedure: PHOTOCOAGULATION WITH LASER;  Surgeon: Hayden Pedro, MD;  Location: [place];  Service: Ophthalmology;  Laterality: Left;  . THYROID SURGERY    . TONSILLECTOMY    . vocal cord biopsy  11/12/14   squamous cell carcinoma in situ    There were no vitals filed for this visit.   Subjective Assessment - 11/10/20 1534    Subjective I stumble some on the carpet    Currently in Pain? No/denies                             [facility] Adult PT Treatment/Exercise - 11/10/20 0001      Ambulation/Gait   Gait Comments no deivce, outside on uneven  terrain, curbs, slopes, with close SBA      High Level Balance   High Level Balance Comments walking ball toss and direction changes, airex balance beam walking tandem and side stepping, minitramp walking, on airex 6" toe touches, and ball toss, stepping up and ontop and off the 6" step, ball kicks      Knee/Hip Exercises: Aerobic   Tread Mill 1.60mh x 7 minutes                    PT Short Term Goals - 10/25/20 1534      PT SHORT TERM GOAL #1   Title Pt will be independent and compliant with initial HEP.    Status Achieved             PT Long Term Goals - 11/10/20 1606      PT LONG TERM GOAL #1   Title incresae hip strength ti 4/5    Status On-going      PT LONG TERM GOAL #2   Title increase UE strength to 4+/5    Status Partially Met      PT LONG TERM GOAL #3   Title decrease TUG time to 14 seconds     Status On-going                 Plan - 11/10/20 1558    Clinical Impression Statement Patient reports that he is feeling a little better, still feels like he catches his feet on the carpet.  We walked outside and he has a very difficult time with this, unsure and unsteady requiring CGA.    PT Next Visit Plan Will work on strength, balance and function    Consulted and Agree with Plan of Care Patient           Patient will benefit from skilled therapeutic intervention in order to improve the following deficits and impairments:  Decreased balance,Decreased mobility,Decreased strength,Postural dysfunction,Improper body mechanics,Impaired flexibility,Hypomobility,Decreased range of motion,Increased fascial restricitons,Dizziness,Decreased endurance,Cardiopulmonary status limiting activity,Decreased activity tolerance  Visit Diagnosis: Muscle weakness (generalized)  Other abnormalities of gait and mobility  Repeated falls  Pain in left hip  Pain in right hip     Problem List Patient Active Problem List   Diagnosis Date Noted  . Closed fracture of one rib of right side 10/04/2020  . Goals of care, counseling/discussion 09/05/2020  . PICC (peripherally inserted central catheter) in place 08/22/2020  . 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 Thrombocytopenia ([place] 08/05/2020  . Anemia 08/05/2020  . AKI (acute kidney injury) ([place] 08/05/2020  . Hematologic malignancy ([place] 08/05/2020  . Acute leukemia ([place] 08/05/2020  . Hypothyroidism 08/05/2020  . Malaise and fatigue 08/04/2020  . Obesity (BMI 30-39.9) 08/04/2020  . COPD, moderate ([place] 05/24/2020  . Bilateral hip pain 05/04/2020  . Plantar fasciitis of left foot 03/30/2020  . Chronic left SI joint pain 03/01/2020  . Shortness of breath 10/01/2019  . Former smoker 10/01/2019  . Healthcare maintenance 10/01/2019  . Epiretinal membrane (ERM) of left eye 07/22/2019  . Centrilobular emphysema ([place] 07/02/2019  .  Pharyngeal dysphagia 04/07/2019  . Cervical radiculopathy 03/16/2019  . 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 Lumbar radiculopathy 04/16/2017  . Rotator cuff syndrome of left shoulder 04/16/2017  . Trochanteric bursitis, left hip 04/16/2017  . Atherosclerosis of artery of both lower extremities ([place] 11/14/2016  . Bilateral carotid artery stenosis 11/14/2016  . Vitamin D deficiency 07/31/2016  . Facet arthritis of lumbar region 09/26/2015  .  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  . History of thyroid cancer 03/26/2014  . Benign prostatic hyperplasia with urinary obstruction 03/23/2014  . Dysphonia 03/17/2014  . Gastro-esophageal reflux disease without esophagitis 03/17/2014  . Hyperlipidemia 03/17/2014  . Other intervertebral disc degeneration, lumbar region 03/17/2014  . PAD (peripheral artery disease) ([place]) 03/17/2014  . Postoperative hypothyroidism 03/17/2014  . Chronic kidney disease, stage 2 (mild) 03/17/2014  . Occasional tremors 03/17/2014    Sumner Boast., PT 11/10/2020, 4:07 PM  [place]. [place], [place], [address] Phone: [phone]   Fax:  [phone]  Name: JAKEOB TULLIS MRN: [id number] Date of Birth: [birth year]

## 2020-11-14 ENCOUNTER — Inpatient Hospital Stay (HOSPITAL_BASED_OUTPATIENT_CLINIC_OR_DEPARTMENT_OTHER): Payer: Medicare Other | Admitting: Oncology

## 2020-11-14 ENCOUNTER — Inpatient Hospital Stay: Payer: Medicare Other

## 2020-11-14 ENCOUNTER — Telehealth: Payer: Self-pay | Admitting: Oncology

## 2020-11-14 ENCOUNTER — Other Ambulatory Visit: Payer: Self-pay

## 2020-11-14 VITALS — BP 138/65 | HR 77 | Temp 97.9°F | Resp 16 | Ht 71.0 in | Wt 194.9 lb

## 2020-11-14 DIAGNOSIS — Z452 Encounter for adjustment and management of vascular access device: Secondary | ICD-10-CM

## 2020-11-14 DIAGNOSIS — C9500 Acute leukemia of unspecified cell type not having achieved remission: Secondary | ICD-10-CM

## 2020-11-14 DIAGNOSIS — C9200 Acute myeloblastic leukemia, not having achieved remission: Secondary | ICD-10-CM | POA: Diagnosis not present

## 2020-11-14 LAB — CBC WITH DIFFERENTIAL (CANCER CENTER ONLY)
Abs Immature Granulocytes: 0 10*3/uL (ref 0.00–0.07)
Basophils Absolute: 0 10*3/uL (ref 0.0–0.1)
Basophils Relative: 1 %
Eosinophils Absolute: 0 10*3/uL (ref 0.0–0.5)
Eosinophils Relative: 0 %
HCT: 34.2 % — ABNORMAL LOW (ref 39.0–52.0)
Hemoglobin: 11.5 g/dL — ABNORMAL LOW (ref 13.0–17.0)
Immature Granulocytes: 0 %
Lymphocytes Relative: 70 %
Lymphs Abs: 0.8 10*3/uL (ref 0.7–4.0)
MCH: 34.1 pg — ABNORMAL HIGH (ref 26.0–34.0)
MCHC: 33.6 g/dL (ref 30.0–36.0)
MCV: 101.5 fL — ABNORMAL HIGH (ref 80.0–100.0)
Monocytes Absolute: 0.1 10*3/uL (ref 0.1–1.0)
Monocytes Relative: 4 %
Neutro Abs: 0.3 10*3/uL — CL (ref 1.7–7.7)
Neutrophils Relative %: 25 %
Platelet Count: 24 10*3/uL — ABNORMAL LOW (ref 150–400)
RBC: 3.37 MIL/uL — ABNORMAL LOW (ref 4.22–5.81)
RDW: 18.8 % — ABNORMAL HIGH (ref 11.5–15.5)
WBC Count: 1.2 10*3/uL — ABNORMAL LOW (ref 4.0–10.5)
nRBC: 0 % (ref 0.0–0.2)

## 2020-11-14 LAB — MAGNESIUM: Magnesium: 1.8 mg/dL (ref 1.7–2.4)

## 2020-11-14 LAB — CMP (CANCER CENTER ONLY)
ALT: 12 U/L (ref 0–44)
AST: 12 U/L — ABNORMAL LOW (ref 15–41)
Albumin: 3.2 g/dL — ABNORMAL LOW (ref 3.5–5.0)
Alkaline Phosphatase: 46 U/L (ref 38–126)
Anion gap: 6 (ref 5–15)
BUN: 17 mg/dL (ref 8–23)
CO2: 26 mmol/L (ref 22–32)
Calcium: 8.2 mg/dL — ABNORMAL LOW (ref 8.9–10.3)
Chloride: 109 mmol/L (ref 98–111)
Creatinine: 0.89 mg/dL (ref 0.61–1.24)
GFR, Estimated: >60 mL/min (ref >60)
Glucose, Bld: 115 mg/dL — ABNORMAL HIGH (ref 70–99)
Potassium: 4.4 mmol/L (ref 3.5–5.1)
Sodium: 141 mmol/L (ref 135–145)
Total Bilirubin: 0.7 mg/dL (ref 0.3–1.2)
Total Protein: 5.2 g/dL — ABNORMAL LOW (ref 6.5–8.1)

## 2020-11-14 MED ORDER — SODIUM CHLORIDE 0.9% FLUSH
10.0000 mL | Freq: Once | INTRAVENOUS | Status: AC
  Administered 2020-11-14: 10 mL
  Filled 2020-11-14: qty 10

## 2020-11-14 MED ORDER — HEPARIN SOD (PORK) LOCK FLUSH 100 UNIT/ML IV SOLN
250.0000 [IU] | Freq: Once | INTRAVENOUS | Status: AC
  Administered 2020-11-14: 250 [IU]
  Filled 2020-11-14: qty 5

## 2020-11-14 NOTE — Telephone Encounter (Signed)
Scheduled appointments per 1/10 los. Spoke to patient who is aware of appointments dates and times. Gave patient calendar print out.

## 2020-11-14 NOTE — Progress Notes (Signed)
[facility] OFFICE PROGRESS NOTE   Diagnosis: AML  INTERVAL HISTORY:   Victor [name] began cycle 3  5-azacytidine and venetoclax on 10/31/2020.  No nausea, rash, diarrhea, fever or bleeding.  Good appetite.  He feels well.  He would like to have the PICC removed when possible.  Objective:  Vital signs in last 24 hours:  Blood pressure 138/65, pulse 77, temperature 97.9 F (36.6 C), temperature source Tympanic, resp. rate 16, height 5' 11"  (1.803 m), weight 194 lb 14.4 oz (88.4 kg), SpO2 99 %.    HEENT: No thrush or ulcers, single 1 mm petechiae of the left buccal mucosa Resp: Lungs clear bilaterally Cardio: Regular rate and rhythm GI: No hepatosplenomegaly Vascular: Trace pitting edema at the ankle bilaterally  Skin: Small ecchymoses near the right PICC  Portacath/PICC-without erythema  Lab Results:  Lab Results  Component Value Date   WBC 1.2 (L) 11/14/2020   HGB 11.5 (L) 11/14/2020   HCT 34.2 (L) 11/14/2020   MCV 101.5 (H) 11/14/2020   PLT 24 (L) 11/14/2020   NEUTROABS 0.3 (LL) 11/14/2020    CMP  Lab Results  Component Value Date   NA 141 11/14/2020   K 4.4 11/14/2020   CL 109 11/14/2020   CO2 26 11/14/2020   GLUCOSE 115 (H) 11/14/2020   BUN 17 11/14/2020   CREATININE 0.89 11/14/2020   CALCIUM 8.2 (L) 11/14/2020   PROT 5.2 (L) 11/14/2020   ALBUMIN 3.2 (L) 11/14/2020   AST 12 (L) 11/14/2020   ALT 12 11/14/2020   ALKPHOS 46 11/14/2020   BILITOT 0.7 11/14/2020   GFRNONAA >60 11/14/2020   GFRAA 52 (L) 08/06/2020    Medications: I have reviewed the patient's current medications.   Assessment/Plan: 1. 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, FLT3-ITD mutation positive  Cycle 1 azacytidine and  venetoclax August 09, 2020;venetoclax dose reduced to 200 mg beginning 08/22/2020  Bone marrow biopsy 09/01/2020-no increase in blast cells  Cycle 2 azacitidine and venetoclax September 19, 2020, venetoclax 400 mg daily 21 days, change to 200 mg daily when he began Diflucan/Levaquin prophylaxis  Cycle 3 azacytidine and venetoclax 10/31/2020, venetoclax 400 mg daily for 14 days  2.Pancytopenia secondary to #1 and chemotherapy 3.Right PICC placed August 08, 2020 4.TTE August 08, 2020-EF 60-65% 5.Fever during hospital admission October 2021, suspicion of pneumonia, treated with cefepime and discharged on Levaquin 6.Hypervolemia with respiratory failure during hospital admission October 2021-diuresis, home Lasix 7.Hypothyroidism secondary to thyroidectomy and radiation-thyroid hormone increased October 2021 8.Acute renal failure October 2021-improved     Disposition: Victor [name] is now at day 15 of cycle 3 5 azacitidine and venetoclax.  He has completed 14 days of venetoclax with this cycle.  Venetoclax will be discontinued.  He has severe neutropenia and thrombocytopenia today.  He will call for bleeding or a fever.  He will return for a lab visit on 11/17/2020.  He will resume fluconazole and levofloxacin prophylaxis.  He would like to discontinue the PICC when possible.  He is interested in having a Port-A-Cath placed.  The plan is to remove the PICC when the platelets and ANC have recovered.  He would like a 1 week treatment break prior to beginning the next cycle of therapy [place]  We will arrange for Port-A-Cath placement prior to the next cycle of treatment.  Victor [name] will return for lab visit on 11/21/2020 and an office visit on 11/24/2020.  Betsy Coder, MD  11/14/2020  3:28 PM

## 2020-11-14 NOTE — Progress Notes (Signed)
A@ 1515: Called panic results to [facility] [phone] to Mercy Moore, RN and faxed to [phone]

## 2020-11-17 ENCOUNTER — Telehealth: Payer: Self-pay

## 2020-11-17 ENCOUNTER — Encounter: Payer: Self-pay | Admitting: Physical Therapy

## 2020-11-17 ENCOUNTER — Telehealth: Payer: Self-pay | Admitting: *Deleted

## 2020-11-17 ENCOUNTER — Inpatient Hospital Stay: Payer: Medicare Other

## 2020-11-17 ENCOUNTER — Other Ambulatory Visit: Payer: Self-pay | Admitting: *Deleted

## 2020-11-17 ENCOUNTER — Ambulatory Visit: Payer: Medicare Other | Admitting: Physical Therapy

## 2020-11-17 ENCOUNTER — Other Ambulatory Visit: Payer: Self-pay

## 2020-11-17 DIAGNOSIS — D696 Thrombocytopenia, unspecified: Secondary | ICD-10-CM

## 2020-11-17 DIAGNOSIS — R296 Repeated falls: Secondary | ICD-10-CM

## 2020-11-17 DIAGNOSIS — C9500 Acute leukemia of unspecified cell type not having achieved remission: Secondary | ICD-10-CM

## 2020-11-17 DIAGNOSIS — M6281 Muscle weakness (generalized): Secondary | ICD-10-CM

## 2020-11-17 DIAGNOSIS — Z452 Encounter for adjustment and management of vascular access device: Secondary | ICD-10-CM

## 2020-11-17 DIAGNOSIS — C9200 Acute myeloblastic leukemia, not having achieved remission: Secondary | ICD-10-CM | POA: Diagnosis not present

## 2020-11-17 DIAGNOSIS — R2689 Other abnormalities of gait and mobility: Secondary | ICD-10-CM

## 2020-11-17 LAB — CBC WITH DIFFERENTIAL (CANCER CENTER ONLY)
Abs Immature Granulocytes: 0.03 10*3/uL (ref 0.00–0.07)
Basophils Absolute: 0 10*3/uL (ref 0.0–0.1)
Basophils Relative: 1 %
Eosinophils Absolute: 0 10*3/uL (ref 0.0–0.5)
Eosinophils Relative: 0 %
HCT: 34.5 % — ABNORMAL LOW (ref 39.0–52.0)
Hemoglobin: 11.3 g/dL — ABNORMAL LOW (ref 13.0–17.0)
Immature Granulocytes: 4 %
Lymphocytes Relative: 88 %
Lymphs Abs: 0.7 10*3/uL (ref 0.7–4.0)
MCH: 33.4 pg (ref 26.0–34.0)
MCHC: 32.8 g/dL (ref 30.0–36.0)
MCV: 102.1 fL — ABNORMAL HIGH (ref 80.0–100.0)
Monocytes Absolute: 0 10*3/uL — ABNORMAL LOW (ref 0.1–1.0)
Monocytes Relative: 3 %
Neutro Abs: 0 10*3/uL — CL (ref 1.7–7.7)
Neutrophils Relative %: 4 %
Platelet Count: 20 10*3/uL — ABNORMAL LOW (ref 150–400)
RBC: 3.38 MIL/uL — ABNORMAL LOW (ref 4.22–5.81)
RDW: 18.7 % — ABNORMAL HIGH (ref 11.5–15.5)
Smear Review: DECREASED
WBC Count: 0.7 10*3/uL — CL (ref 4.0–10.5)
nRBC: 0 % (ref 0.0–0.2)

## 2020-11-17 LAB — SAMPLE TO BLOOD BANK

## 2020-11-17 MED ORDER — HEPARIN SOD (PORK) LOCK FLUSH 100 UNIT/ML IV SOLN
250.0000 [IU] | Freq: Once | INTRAVENOUS | Status: AC
  Administered 2020-11-17: 250 [IU]
  Filled 2020-11-17: qty 5

## 2020-11-17 MED ORDER — SODIUM CHLORIDE 0.9% FLUSH
10.0000 mL | INTRAVENOUS | Status: AC | PRN
  Administered 2020-11-17: 10 mL
  Filled 2020-11-17: qty 10

## 2020-11-17 MED ORDER — HEPARIN SOD (PORK) LOCK FLUSH 100 UNIT/ML IV SOLN
250.0000 [IU] | INTRAVENOUS | Status: AC | PRN
  Administered 2020-11-17: 250 [IU]
  Filled 2020-11-17: qty 5

## 2020-11-17 MED ORDER — SODIUM CHLORIDE 0.9% FLUSH
10.0000 mL | Freq: Once | INTRAVENOUS | Status: AC
  Administered 2020-11-17: 10 mL
  Filled 2020-11-17: qty 10

## 2020-11-17 MED ORDER — SODIUM CHLORIDE 0.9% IV SOLUTION
250.0000 mL | Freq: Once | INTRAVENOUS | Status: AC
  Administered 2020-11-17: 250 mL via INTRAVENOUS
  Filled 2020-11-17: qty 250

## 2020-11-17 NOTE — Therapy (Signed)
[place]. [place], [place], [address] Phone: [phone]   Fax:  [phone]  Physical Therapy Treatment  Patient Details  Name: Victor [name] MRN: [id number] Date of Birth: 08-31-1936 Referring Provider (PT): Clinton Quant Date: 11/17/2020   PT End of Session - 11/17/20 8889    Visit Number 7    Number of Visits 16    Date for PT Re-Evaluation 12/12/20    Authorization Type BCBS    PT Start Time 1529    PT Stop Time 1615    PT Time Calculation (min) 46 min    Activity Tolerance Patient tolerated treatment well    Behavior During Therapy [facility] for tasks assessed/performed           Past Medical History:  Diagnosis Date  . Arthritis   . Atherosclerosis   . Atherosclerotic PVD with intermittent claudication (HCC)    stent left leg  . BPH (benign prostatic hyperplasia)   . Bulging lumbar disc   . Cancer ([place]) 11/12/14   vocal cord  carcinoma in situ , radiation; thyroid cancer  . Carotid bruit   . Chronic kidney disease    chronic stage III  . COPD (chronic obstructive pulmonary disease) ([place])   . DDD (degenerative disc disease), lumbar   . Dysphonia   . Dysplasia of true vocal cord   . GERD (gastroesophageal reflux disease)   . H/O carotid atherosclerosis    b/l  . Hyperlipidemia   . Hypothyroidism   . Occasional tremors    left hand managed with propranolol  . Peripheral vascular angioplasty status with implants and grafts   . Precancerous lesion 03/05/2018   premelanoma removed from back.   . Radiation 03/10/15- 04/18/16   Larynx  . Sleep apnea    hasn't used CPAP in years  . Spondylosis of lumbosacral region   . Thyroid disease     Past Surgical History:  Procedure Laterality Date  . APPENDECTOMY    . COLONOSCOPY W/ POLYPECTOMY    . DIODE LASER APPLICATION Left 16/94/5038   Procedure: DIODE LASER APPLICATION;  Surgeon: Hayden Pedro, MD;  Location: [place];  Service:  Ophthalmology;  Laterality: Left;  . MEMBRANE PEEL Left 08/18/2019   Procedure: MEMBRANE PEEL;  Surgeon: Hayden Pedro, MD;  Location: [place];  Service: Ophthalmology;  Laterality: Left;  . MOUTH SURGERY     tooth extraction  . PARS PLANA VITRECTOMY Left 08/18/2019   PARS PLANA VITRECTOMY 27 GAUGE (Left)  . PARS PLANA VITRECTOMY 27 GAUGE Left 08/18/2019   Procedure: PARS PLANA VITRECTOMY 27 GAUGE;  Surgeon: Hayden Pedro, MD;  Location: [place];  Service: Ophthalmology;  Laterality: Left;  . PHOTOCOAGULATION WITH LASER Left 08/18/2019   Procedure: PHOTOCOAGULATION WITH LASER;  Surgeon: Hayden Pedro, MD;  Location: [place];  Service: Ophthalmology;  Laterality: Left;  . THYROID SURGERY    . TONSILLECTOMY    . vocal cord biopsy  11/12/14   squamous cell carcinoma in situ    There were no vitals filed for this visit.   Subjective Assessment - 11/17/20 1535    Subjective "I drag my feet"              [facility] PT Assessment - 11/17/20 0001      Timed Up and Go Test   Normal TUG (seconds) 17    TUG Comments no device  [facility] [facility] Adult PT Treatment/Exercise - 11/17/20 0001      Ambulation/Gait   Gait Comments no deivce, outside on uneven terrain, curbs, slopes, with close SBA, he did become fatigued and started shuffling his feet      High Level Balance   High Level Balance Comments walking ball toss and direction changes, airex balance beam walking tandem and side stepping, minitramp walking, on airex 6" toe touches, and ball toss, stepping up and ontop and off the 6" step, ball kicks      Knee/Hip Exercises: Aerobic   Tread Mill 1.44mh x 9 minutes    Nustep level 5 x 5 minutes      Knee/Hip Exercises: Machines for Strengthening   Cybex Knee Extension 10# 3x10    Cybex Knee Flexion 25# 3x10                    PT Short Term Goals - 10/25/20 1534      PT SHORT TERM GOAL #1   Title Pt will be independent and compliant with  initial HEP.    Status Achieved             PT Long Term Goals - 11/17/20 1617      PT LONG TERM GOAL #1   Title incresae hip strength to 4/5    Status On-going      PT LONG TERM GOAL #2   Title increase UE strength to 4+/5    Status Partially Met      PT LONG TERM GOAL #3   Title decrease TUG time to 14 seconds    Status Partially Met                 Plan - 11/17/20 1613    Clinical Impression Statement Patient doing well, reports that his platelets were low today,   He is walking better and looks to have better balance.  He did not have as much difficulty on the airex today, Walking outside he did well but when fatigued he really started shuffling his feet.  TUG time is much improved and without device    PT Next Visit Plan Will work on strength, balance and function    Consulted and Agree with Plan of Care Patient           Patient will benefit from skilled therapeutic intervention in order to improve the following deficits and impairments:  Decreased balance,Decreased mobility,Decreased strength,Postural dysfunction,Improper body mechanics,Impaired flexibility,Hypomobility,Decreased range of motion,Increased fascial restricitons,Dizziness,Decreased endurance,Cardiopulmonary status limiting activity,Decreased activity tolerance  Visit Diagnosis: Muscle weakness (generalized)  Other abnormalities of gait and mobility  Repeated falls     Problem List Patient Active Problem List   Diagnosis Date Noted  . Closed fracture of one rib of right side 10/04/2020  . Goals of care, counseling/discussion 09/05/2020  . PICC (peripherally inserted central catheter) in place 08/22/2020  . 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 Thrombocytopenia ([facility] 08/05/2020  . Anemia 08/05/2020  . AKI (acute kidney injury) ([place] 08/05/2020  . Hematologic malignancy ([facility] 08/05/2020  . Acute leukemia ([place] 08/05/2020  . Hypothyroidism 08/05/2020  . Malaise and fatigue 08/04/2020  .  Obesity (BMI 30-39.9) 08/04/2020  . COPD, moderate ([facility] 05/24/2020  . Bilateral hip pain 05/04/2020  . Plantar fasciitis of left foot 03/30/2020  . Chronic left SI joint pain 03/01/2020  . Shortness of breath 10/01/2019  . Former smoker 10/01/2019  . Healthcare maintenance 10/01/2019  . Epiretinal membrane (ERM) of left eye 07/22/2019  .  Centrilobular emphysema ([place]) 07/02/2019  . Pharyngeal dysphagia 04/07/2019  . Cervical radiculopathy 03/16/2019  . 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 Lumbar radiculopathy 04/16/2017  . Rotator cuff syndrome of left shoulder 04/16/2017  . Trochanteric bursitis, left hip 04/16/2017  . Atherosclerosis of artery of both lower extremities ([place]) 11/14/2016  . Bilateral carotid artery stenosis 11/14/2016  . Vitamin D deficiency 07/31/2016  . Facet arthritis of lumbar region 09/26/2015  . 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  . History of thyroid cancer 03/26/2014  . Benign prostatic hyperplasia with urinary obstruction 03/23/2014  . Dysphonia 03/17/2014  . Gastro-esophageal reflux disease without esophagitis 03/17/2014  . Hyperlipidemia 03/17/2014  . Other intervertebral disc degeneration, lumbar region 03/17/2014  . PAD (peripheral artery disease) ([place]) 03/17/2014  . Postoperative hypothyroidism 03/17/2014  . Chronic kidney disease, stage 2 (mild) 03/17/2014  . Occasional tremors 03/17/2014    Sumner Boast., PT 11/17/2020, 4:18 PM  [place]. [place], [place], [address] Phone: [phone]   Fax:  [phone]  Name: Victor [name] MRN: [id number] Date of Birth: 1936-09-09

## 2020-11-17 NOTE — Progress Notes (Signed)
Needs platelets today per [facility] protocol and Dr. Benay Spice w/count 20,000 and weekend coming up and potential snow on Monday. Should be ready in few minutes.

## 2020-11-17 NOTE — Patient Instructions (Signed)

## 2020-11-17 NOTE — Telephone Encounter (Signed)
Pt's lab results/critical values called to Dr. Zenda Alpers at [facility]. Lab values also faxed. Reported current medications and that pt is getting 1 unit of plts today.

## 2020-11-17 NOTE — Telephone Encounter (Signed)
CRITICAL VALUE STICKER  CRITICAL VALUE:  WBC 0.7  RECEIVER (on-site recipient of call): Georgina Pillion, RN  DATE & TIME NOTIFIED: 11/17/20; 1010  MESSENGER (representative from lab):Jay   MD NOTIFIED: Dr. Blinda Leatherwood  TIME OF NOTIFICATION:1014  RESPONSE:

## 2020-11-18 LAB — BPAM PLATELET PHERESIS
Blood Product Expiration Date: 202201152359
ISSUE DATE / TIME: 202201131141
Unit Type and Rh: 9500

## 2020-11-18 LAB — PREPARE PLATELET PHERESIS: Unit division: 0

## 2020-11-21 ENCOUNTER — Telehealth: Payer: Self-pay

## 2020-11-21 ENCOUNTER — Inpatient Hospital Stay: Payer: Medicare Other

## 2020-11-21 ENCOUNTER — Other Ambulatory Visit: Payer: Self-pay

## 2020-11-21 DIAGNOSIS — C9200 Acute myeloblastic leukemia, not having achieved remission: Secondary | ICD-10-CM | POA: Diagnosis not present

## 2020-11-21 DIAGNOSIS — C9500 Acute leukemia of unspecified cell type not having achieved remission: Secondary | ICD-10-CM

## 2020-11-21 DIAGNOSIS — Z452 Encounter for adjustment and management of vascular access device: Secondary | ICD-10-CM

## 2020-11-21 LAB — CBC WITH DIFFERENTIAL (CANCER CENTER ONLY)
Abs Immature Granulocytes: 0 10*3/uL (ref 0.00–0.07)
Basophils Absolute: 0 10*3/uL (ref 0.0–0.1)
Basophils Relative: 1 %
Eosinophils Absolute: 0 10*3/uL (ref 0.0–0.5)
Eosinophils Relative: 0 %
HCT: 33 % — ABNORMAL LOW (ref 39.0–52.0)
Hemoglobin: 10.8 g/dL — ABNORMAL LOW (ref 13.0–17.0)
Immature Granulocytes: 0 %
Lymphocytes Relative: 87 %
Lymphs Abs: 0.7 10*3/uL (ref 0.7–4.0)
MCH: 33.5 pg (ref 26.0–34.0)
MCHC: 32.7 g/dL (ref 30.0–36.0)
MCV: 102.5 fL — ABNORMAL HIGH (ref 80.0–100.0)
Monocytes Absolute: 0 10*3/uL — ABNORMAL LOW (ref 0.1–1.0)
Monocytes Relative: 1 %
Neutro Abs: 0.1 10*3/uL — CL (ref 1.7–7.7)
Neutrophils Relative %: 11 %
Platelet Count: 35 10*3/uL — ABNORMAL LOW (ref 150–400)
RBC: 3.22 MIL/uL — ABNORMAL LOW (ref 4.22–5.81)
RDW: 18 % — ABNORMAL HIGH (ref 11.5–15.5)
WBC Count: 0.8 10*3/uL — CL (ref 4.0–10.5)
nRBC: 0 % (ref 0.0–0.2)

## 2020-11-21 LAB — CMP (CANCER CENTER ONLY)
ALT: 11 U/L (ref 0–44)
AST: 16 U/L (ref 15–41)
Albumin: 3.5 g/dL (ref 3.5–5.0)
Alkaline Phosphatase: 50 U/L (ref 38–126)
Anion gap: 7 (ref 5–15)
BUN: 17 mg/dL (ref 8–23)
CO2: 27 mmol/L (ref 22–32)
Calcium: 8.5 mg/dL — ABNORMAL LOW (ref 8.9–10.3)
Chloride: 108 mmol/L (ref 98–111)
Creatinine: 0.96 mg/dL (ref 0.61–1.24)
GFR, Estimated: >60 mL/min (ref >60)
Glucose, Bld: 108 mg/dL — ABNORMAL HIGH (ref 70–99)
Potassium: 4.1 mmol/L (ref 3.5–5.1)
Sodium: 142 mmol/L (ref 135–145)
Total Bilirubin: 0.7 mg/dL (ref 0.3–1.2)
Total Protein: 5.6 g/dL — ABNORMAL LOW (ref 6.5–8.1)

## 2020-11-21 LAB — SAMPLE TO BLOOD BANK

## 2020-11-21 LAB — MAGNESIUM: Magnesium: 2.1 mg/dL (ref 1.7–2.4)

## 2020-11-21 MED ORDER — HEPARIN SOD (PORK) LOCK FLUSH 100 UNIT/ML IV SOLN
250.0000 [IU] | Freq: Once | INTRAVENOUS | Status: AC
  Administered 2020-11-21: 250 [IU]
  Filled 2020-11-21: qty 5

## 2020-11-21 MED ORDER — SODIUM CHLORIDE 0.9% FLUSH
10.0000 mL | Freq: Once | INTRAVENOUS | Status: AC
  Administered 2020-11-21: 10 mL
  Filled 2020-11-21: qty 10

## 2020-11-21 NOTE — Telephone Encounter (Signed)
Critical lab results : WBC:0.8, HGB:10.8 reported by lab. Results given to [name] NP

## 2020-11-21 NOTE — Telephone Encounter (Signed)
Received critical  ANC value of 0.1 from lab. Lab results to be called to Dr. Zenda Alpers at [place] closed today but will fax results today and call lab results in am. Tonye Royalty NP made aware of results.

## 2020-11-22 ENCOUNTER — Telehealth: Payer: Self-pay

## 2020-11-22 NOTE — Telephone Encounter (Signed)
Lab results called today. Office was closed yesterday. Labs also faxed successfully to Dr. Mardelle Matte [facility].

## 2020-11-24 ENCOUNTER — Inpatient Hospital Stay (HOSPITAL_BASED_OUTPATIENT_CLINIC_OR_DEPARTMENT_OTHER): Payer: Medicare Other | Admitting: Nurse Practitioner

## 2020-11-24 ENCOUNTER — Inpatient Hospital Stay: Payer: Medicare Other

## 2020-11-24 ENCOUNTER — Encounter (INDEPENDENT_AMBULATORY_CARE_PROVIDER_SITE_OTHER): Payer: Medicare Other | Admitting: Ophthalmology

## 2020-11-24 ENCOUNTER — Other Ambulatory Visit: Payer: Self-pay

## 2020-11-24 ENCOUNTER — Encounter: Payer: Self-pay | Admitting: Nurse Practitioner

## 2020-11-24 VITALS — BP 141/64 | HR 80 | Temp 97.6°F | Resp 18 | Ht 71.0 in | Wt 195.6 lb

## 2020-11-24 DIAGNOSIS — C9200 Acute myeloblastic leukemia, not having achieved remission: Secondary | ICD-10-CM | POA: Diagnosis not present

## 2020-11-24 DIAGNOSIS — C9500 Acute leukemia of unspecified cell type not having achieved remission: Secondary | ICD-10-CM

## 2020-11-24 DIAGNOSIS — Z452 Encounter for adjustment and management of vascular access device: Secondary | ICD-10-CM

## 2020-11-24 LAB — CBC WITH DIFFERENTIAL (CANCER CENTER ONLY)
Abs Immature Granulocytes: 0 10*3/uL (ref 0.00–0.07)
Basophils Absolute: 0 10*3/uL (ref 0.0–0.1)
Basophils Relative: 1 %
Eosinophils Absolute: 0 10*3/uL (ref 0.0–0.5)
Eosinophils Relative: 0 %
HCT: 34.4 % — ABNORMAL LOW (ref 39.0–52.0)
Hemoglobin: 11.4 g/dL — ABNORMAL LOW (ref 13.0–17.0)
Immature Granulocytes: 0 %
Lymphocytes Relative: 86 %
Lymphs Abs: 0.6 10*3/uL — ABNORMAL LOW (ref 0.7–4.0)
MCH: 33.7 pg (ref 26.0–34.0)
MCHC: 33.1 g/dL (ref 30.0–36.0)
MCV: 101.8 fL — ABNORMAL HIGH (ref 80.0–100.0)
Monocytes Absolute: 0 10*3/uL — ABNORMAL LOW (ref 0.1–1.0)
Monocytes Relative: 3 %
Neutro Abs: 0.1 10*3/uL — CL (ref 1.7–7.7)
Neutrophils Relative %: 10 %
Platelet Count: 55 10*3/uL — ABNORMAL LOW (ref 150–400)
RBC: 3.38 MIL/uL — ABNORMAL LOW (ref 4.22–5.81)
RDW: 17.9 % — ABNORMAL HIGH (ref 11.5–15.5)
WBC Count: 0.7 10*3/uL — CL (ref 4.0–10.5)
nRBC: 0 % (ref 0.0–0.2)

## 2020-11-24 MED ORDER — ACYCLOVIR 400 MG PO TABS: 400.0000 mg | ORAL_TABLET | Freq: Two times a day (BID) | ORAL | 2 refills | Status: DC | PRN

## 2020-11-24 MED ORDER — SODIUM CHLORIDE 0.9% FLUSH
10.0000 mL | Freq: Once | INTRAVENOUS | Status: AC
  Administered 2020-11-24: 10 mL
  Filled 2020-11-24: qty 10

## 2020-11-24 MED ORDER — HEPARIN SOD (PORK) LOCK FLUSH 100 UNIT/ML IV SOLN
250.0000 [IU] | Freq: Once | INTRAVENOUS | Status: AC
  Administered 2020-11-24: 250 [IU]
  Filled 2020-11-24: qty 5

## 2020-11-24 NOTE — Progress Notes (Addendum)
  Victor [name] OFFICE PROGRESS NOTE   Diagnosis:  AML  INTERVAL HISTORY:   Victor [name] returns as scheduled.  He completed cycle three 5-azacytidine/venetoclax beginning 10/31/2020.  He denies fever.  No bleeding.  No nausea or vomiting.  No significant constipation or diarrhea.  He denies pain.  Objective:  Vital signs in last 24 hours:  Blood pressure (!) 141/64, pulse 80, temperature 97.6 F (36.4 C), temperature source Tympanic, resp. rate 18, height _0  (1.803 m), weight 195 lb 9.6 oz (88.7 kg), SpO2 98 %.    HEENT: No thrush or ulcers.   Resp: Lungs clear bilaterally. Cardio: Regular rate and rhythm. GI: No hepatosplenomegaly. Vascular: No leg edema.  Skin: Small ecchymoses scattered over dorsal aspect of the hands, forearms. Right upper extremity PICC without erythema.   Lab Results:  Lab Results  Component Value Date   WBC 0.7 (LL) 11/24/2020   HGB 11.4 (L) 11/24/2020   HCT 34.4 (L) 11/24/2020   MCV 101.8 (H) 11/24/2020   PLT 55 (L) 11/24/2020   NEUTROABS 0.1 (LL) 11/24/2020    Imaging:  No results found.  Medications: I have reviewed the patient's current medications.  Assessment/Plan: 1. 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, FLT3-ITD mutation positive  Cycle 1 azacytidine and venetoclax August 09, 2020;venetoclax dose reduced to 200 mg beginning 08/22/2020  Bone marrow biopsy 09/01/2020-no increase in blast cells  Cycle 2 azacitidine and venetoclax September 19, 2020, venetoclax 400 mg daily 21 days, change to 200 mg daily when he began Diflucan/Levaquin prophylaxis  Cycle 3 azacytidine and venetoclax 10/31/2020, venetoclax 400 mg daily for 14 days  2.Pancytopenia secondary to #1 and  chemotherapy 3.Right PICC placed August 08, 2020 4.TTE August 08, 2020-EF 60-65% 5.Fever during hospital admission October 2021, suspicion of pneumonia, treated with cefepime and discharged on Levaquin 6.Hypervolemia with respiratory failure during hospital admission October 2021-diuresis, home Lasix 7.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  He is now at day 25 of cycle 3 5 azacitidine and venetoclax.  Platelet count is recovering.  He continues to have severe neutropenia.  He understands to contact the office with fever, chills, other signs of infection, bleeding.  Follow-up CBC 11/28/2020.  Plan for PICC removal and referral for port placement with recovery of the white count.  He has an appointment at [facility] on 12/07/2020.  He will return for follow-up, next cycle of 5-azacytidine on 12/12/2020.  He will contact the office in the interim as outlined above or with any other problems.  Patient seen with Dr. Benay Spice.    [name] ANP/GNP-BC   11/24/2020  10:58 AM This was a shared visit with [name].  Victor [name] is now day 5 following cycle 3 of 5 azacytidine/venetoclax.  The platelets are recovering.  Hopefully the white count will recover over the next week.  He would like to have the PICC removed after count recovery.  We will refer him for Port-A-Cath placement prior to the next scheduled cycle of systemic therapy.  [name], MD

## 2020-11-25 ENCOUNTER — Telehealth: Payer: Self-pay | Admitting: Nurse Practitioner

## 2020-11-25 ENCOUNTER — Ambulatory Visit: Payer: Medicare Other | Admitting: Physical Therapy

## 2020-11-25 NOTE — Telephone Encounter (Signed)
Scheduled appointments per 1/20 los. Called patient, no answer. Left message with appointments dates and times.

## 2020-11-27 ENCOUNTER — Other Ambulatory Visit: Payer: Self-pay | Admitting: Oncology

## 2020-11-28 ENCOUNTER — Inpatient Hospital Stay: Payer: Medicare Other

## 2020-11-28 ENCOUNTER — Other Ambulatory Visit: Payer: Self-pay | Admitting: *Deleted

## 2020-11-28 ENCOUNTER — Other Ambulatory Visit: Payer: Self-pay

## 2020-11-28 DIAGNOSIS — C9500 Acute leukemia of unspecified cell type not having achieved remission: Secondary | ICD-10-CM

## 2020-11-28 DIAGNOSIS — C9200 Acute myeloblastic leukemia, not having achieved remission: Secondary | ICD-10-CM | POA: Diagnosis not present

## 2020-11-28 DIAGNOSIS — Z452 Encounter for adjustment and management of vascular access device: Secondary | ICD-10-CM

## 2020-11-28 LAB — CMP (CANCER CENTER ONLY)
ALT: 6 U/L (ref 0–44)
AST: 11 U/L — ABNORMAL LOW (ref 15–41)
Albumin: 3.3 g/dL — ABNORMAL LOW (ref 3.5–5.0)
Alkaline Phosphatase: 63 U/L (ref 38–126)
Anion gap: 8 (ref 5–15)
BUN: 18 mg/dL (ref 8–23)
CO2: 24 mmol/L (ref 22–32)
Calcium: 8.4 mg/dL — ABNORMAL LOW (ref 8.9–10.3)
Chloride: 110 mmol/L (ref 98–111)
Creatinine: 1.06 mg/dL (ref 0.61–1.24)
GFR, Estimated: >60 mL/min (ref >60)
Glucose, Bld: 103 mg/dL — ABNORMAL HIGH (ref 70–99)
Potassium: 3.6 mmol/L (ref 3.5–5.1)
Sodium: 142 mmol/L (ref 135–145)
Total Bilirubin: 0.5 mg/dL (ref 0.3–1.2)
Total Protein: 5.5 g/dL — ABNORMAL LOW (ref 6.5–8.1)

## 2020-11-28 LAB — CBC WITH DIFFERENTIAL (CANCER CENTER ONLY)
Abs Immature Granulocytes: 0.01 10*3/uL (ref 0.00–0.07)
Basophils Absolute: 0 10*3/uL (ref 0.0–0.1)
Basophils Relative: 1 %
Eosinophils Absolute: 0 10*3/uL (ref 0.0–0.5)
Eosinophils Relative: 0 %
HCT: 34.3 % — ABNORMAL LOW (ref 39.0–52.0)
Hemoglobin: 11.3 g/dL — ABNORMAL LOW (ref 13.0–17.0)
Immature Granulocytes: 1 %
Lymphocytes Relative: 71 %
Lymphs Abs: 0.7 10*3/uL (ref 0.7–4.0)
MCH: 33.2 pg (ref 26.0–34.0)
MCHC: 32.9 g/dL (ref 30.0–36.0)
MCV: 100.9 fL — ABNORMAL HIGH (ref 80.0–100.0)
Monocytes Absolute: 0.1 10*3/uL (ref 0.1–1.0)
Monocytes Relative: 14 %
Neutro Abs: 0.1 10*3/uL — CL (ref 1.7–7.7)
Neutrophils Relative %: 13 %
Platelet Count: 94 10*3/uL — ABNORMAL LOW (ref 150–400)
RBC: 3.4 MIL/uL — ABNORMAL LOW (ref 4.22–5.81)
RDW: 17.3 % — ABNORMAL HIGH (ref 11.5–15.5)
WBC Count: 1 10*3/uL — ABNORMAL LOW (ref 4.0–10.5)
nRBC: 0 % (ref 0.0–0.2)

## 2020-11-28 LAB — MAGNESIUM: Magnesium: 2.1 mg/dL (ref 1.7–2.4)

## 2020-11-28 MED ORDER — HEPARIN SOD (PORK) LOCK FLUSH 100 UNIT/ML IV SOLN
250.0000 [IU] | Freq: Once | INTRAVENOUS | Status: AC
  Administered 2020-11-28: 250 [IU]
  Filled 2020-11-28: qty 5

## 2020-11-28 MED ORDER — SODIUM CHLORIDE 0.9% FLUSH
10.0000 mL | Freq: Once | INTRAVENOUS | Status: AC
  Administered 2020-11-28: 10 mL
  Filled 2020-11-28: qty 10

## 2020-11-29 ENCOUNTER — Ambulatory Visit (INDEPENDENT_AMBULATORY_CARE_PROVIDER_SITE_OTHER): Payer: Medicare Other | Admitting: Family Medicine

## 2020-11-29 ENCOUNTER — Ambulatory Visit: Payer: Self-pay

## 2020-11-29 VITALS — BP 130/50 | Ht 70.0 in | Wt 190.0 lb

## 2020-11-29 DIAGNOSIS — M7062 Trochanteric bursitis, left hip: Secondary | ICD-10-CM

## 2020-11-29 MED ORDER — TRIAMCINOLONE ACETONIDE 40 MG/ML IJ SUSP
40.0000 mg | Freq: Once | INTRAMUSCULAR | Status: AC
  Administered 2020-11-29: 40 mg via INTRA_ARTICULAR

## 2020-11-29 NOTE — Assessment & Plan Note (Signed)
Acute on chronic in nature.  Has most pain with lying on the affected side. -Counseled on home exercise therapy and supportive care. -Injection. -Has ongoing physical therapy from instability and balance issues.

## 2020-11-29 NOTE — Patient Instructions (Signed)
Good to see you °Please try ice as needed   °Please send me a message in MyChart with any questions or updates.  °Please see me back in 6-8 weeks.  ° °--Dr. Dyshon Philbin ° °

## 2020-11-29 NOTE — Progress Notes (Signed)
ISAIAHS [name] - 85 y.o. male MRN [id number]  Date of birth: 17-Oct-1936  SUBJECTIVE:  Including CC & ROS.  No chief complaint on file.   Victor [name] is a 85 y.o. male that is presenting with acute worsening of his left hip pain.  The pain is localized to the lateral aspect.  He is unable to lie on the affected side.  No recent injury.   Review of Systems See HPI   HISTORY: Past Medical, Surgical, Social, and Family History Reviewed & Updated per EMR.   Pertinent Historical Findings include:  Past Medical History:  Diagnosis Date  . Arthritis   . Atherosclerosis   . Atherosclerotic PVD with intermittent claudication (HCC)    stent left leg  . BPH (benign prostatic hyperplasia)   . Bulging lumbar disc   . Cancer ([place]) 11/12/14   vocal cord  carcinoma in situ , radiation; thyroid cancer  . Carotid bruit   . Chronic kidney disease    chronic stage III  . COPD (chronic obstructive pulmonary disease) ([place])   . DDD (degenerative disc disease), lumbar   . Dysphonia   . Dysplasia of true vocal cord   . GERD (gastroesophageal reflux disease)   . H/O carotid atherosclerosis    b/l  . Hyperlipidemia   . Hypothyroidism   . Occasional tremors    left hand managed with propranolol  . Peripheral vascular angioplasty status with implants and grafts   . Precancerous lesion 03/05/2018   premelanoma removed from back.   . Radiation 03/10/15- 04/18/16   Larynx  . Sleep apnea    hasn't used CPAP in years  . Spondylosis of lumbosacral region   . Thyroid disease     Past Surgical History:  Procedure Laterality Date  . APPENDECTOMY    . COLONOSCOPY W/ POLYPECTOMY    . DIODE LASER APPLICATION Left 74/94/4967   Procedure: DIODE LASER APPLICATION;  Surgeon: Hayden Pedro, MD;  Location: [place];  Service: Ophthalmology;  Laterality: Left;  . MEMBRANE PEEL Left 08/18/2019   Procedure: MEMBRANE PEEL;  Surgeon: Hayden Pedro, MD;  Location: [place];  Service: Ophthalmology;  Laterality:  Left;  . MOUTH SURGERY     tooth extraction  . PARS PLANA VITRECTOMY Left 08/18/2019   PARS PLANA VITRECTOMY 27 GAUGE (Left)  . PARS PLANA VITRECTOMY 27 GAUGE Left 08/18/2019   Procedure: PARS PLANA VITRECTOMY 27 GAUGE;  Surgeon: Hayden Pedro, MD;  Location: [place];  Service: Ophthalmology;  Laterality: Left;  . PHOTOCOAGULATION WITH LASER Left 08/18/2019   Procedure: PHOTOCOAGULATION WITH LASER;  Surgeon: Hayden Pedro, MD;  Location: [place];  Service: Ophthalmology;  Laterality: Left;  . THYROID SURGERY    . TONSILLECTOMY    . vocal cord biopsy  11/12/14   squamous cell carcinoma in situ    Family History  Problem Relation Age of Onset  . Colon cancer Father   . Cancer Sister        Died of metastatic cancer  . Leukemia Neg Hx     Social History   Socioeconomic History  . Marital status: Married    Spouse name: Not on file  . Number of children: Not on file  . Years of education: Not on file  . Highest education level: Not on file  Occupational History  . Not on file  Tobacco Use  . Smoking status: Former Smoker    Packs/day: 2.00    Years: 51.00    Pack  years: 102.00    Types: Cigarettes    Start date: 61    Quit date: 03/05/2004    Years since quitting: 16.7  . Smokeless tobacco: Never Used  Vaping Use  . Vaping Use: Never used  Substance and Sexual Activity  . Alcohol use: Yes    Alcohol/week: 7.0 standard drinks    Types: 7 Standard drinks or equivalent per week    Comment: 1 drink of rum per day  . Drug use: No  . Sexual activity: Not on file  Other Topics Concern  . Not on file  Social History Narrative  . Not on file   Social Determinants of Health   Financial Resource Strain: Not on file  Food Insecurity: Not on file  Transportation Needs: Not on file  Physical Activity: Not on file  Stress: Not on file  Social Connections: Not on file  Intimate Partner Violence: Not on file     PHYSICAL EXAM:  VS: BP (!) 130/50   Ht 5\' 10"  (1.778 m)    Wt 190 lb (86.2 kg)   BMI 27.26 kg/m  Physical Exam Gen: NAD, alert, cooperative with exam, well-appearing MSK:  Left hip: No swelling or ecchymosis. Tenderness to palpation of the greater trochanter. Normal internal/external rotation. Neurovascularly intact   Aspiration/Injection Procedure Note Victor [name] Oct 27, 1936  Procedure: Injection Indications: Left hip pain  Procedure Details Consent: Risks of procedure as well as the alternatives and risks of each were explained to the (patient/caregiver).  Consent for procedure obtained. Time Out: Verified patient identification, verified procedure, site/side was marked, verified correct patient position, special equipment/implants available, medications/allergies/relevent history reviewed, required imaging and test results available.  Performed.  The area was cleaned with iodine and alcohol swabs.    The Left greater trochanteric bursa was injected using 1 cc's of 40 mg  Depo-Medrol and 4 cc's of 0.25% bupivacaine with a 22 3 1/2" needle.  Ultrasound was used. Images were obtained in short views showing the injection.     A sterile dressing was applied.  Patient did tolerate procedure well.     ASSESSMENT & PLAN:   Trochanteric bursitis, left hip Acute on chronic in nature.  Has most pain with lying on the affected side. -Counseled on home exercise therapy and supportive care. -Injection. -Has ongoing physical therapy from instability and balance issues.

## 2020-12-01 ENCOUNTER — Inpatient Hospital Stay: Payer: Medicare Other

## 2020-12-01 ENCOUNTER — Telehealth: Payer: Self-pay | Admitting: *Deleted

## 2020-12-01 ENCOUNTER — Other Ambulatory Visit: Payer: Self-pay

## 2020-12-01 ENCOUNTER — Ambulatory Visit: Payer: Medicare Other | Admitting: Physical Therapy

## 2020-12-01 DIAGNOSIS — C9200 Acute myeloblastic leukemia, not having achieved remission: Secondary | ICD-10-CM | POA: Diagnosis not present

## 2020-12-01 DIAGNOSIS — Z452 Encounter for adjustment and management of vascular access device: Secondary | ICD-10-CM

## 2020-12-01 DIAGNOSIS — D696 Thrombocytopenia, unspecified: Secondary | ICD-10-CM

## 2020-12-01 DIAGNOSIS — C9500 Acute leukemia of unspecified cell type not having achieved remission: Secondary | ICD-10-CM

## 2020-12-01 LAB — CBC WITH DIFFERENTIAL (CANCER CENTER ONLY)
Abs Immature Granulocytes: 0.01 10*3/uL (ref 0.00–0.07)
Basophils Absolute: 0 10*3/uL (ref 0.0–0.1)
Basophils Relative: 1 %
Eosinophils Absolute: 0 10*3/uL (ref 0.0–0.5)
Eosinophils Relative: 0 %
HCT: 34.3 % — ABNORMAL LOW (ref 39.0–52.0)
Hemoglobin: 11.6 g/dL — ABNORMAL LOW (ref 13.0–17.0)
Immature Granulocytes: 1 %
Lymphocytes Relative: 38 %
Lymphs Abs: 0.7 10*3/uL (ref 0.7–4.0)
MCH: 33.8 pg (ref 26.0–34.0)
MCHC: 33.8 g/dL (ref 30.0–36.0)
MCV: 100 fL (ref 80.0–100.0)
Monocytes Absolute: 0.6 10*3/uL (ref 0.1–1.0)
Monocytes Relative: 32 %
Neutro Abs: 0.5 10*3/uL — ABNORMAL LOW (ref 1.7–7.7)
Neutrophils Relative %: 28 %
Platelet Count: 94 10*3/uL — ABNORMAL LOW (ref 150–400)
RBC: 3.43 MIL/uL — ABNORMAL LOW (ref 4.22–5.81)
RDW: 17.1 % — ABNORMAL HIGH (ref 11.5–15.5)
WBC Count: 1.9 10*3/uL — ABNORMAL LOW (ref 4.0–10.5)
nRBC: 0 % (ref 0.0–0.2)

## 2020-12-01 MED ORDER — SODIUM CHLORIDE 0.9% FLUSH
10.0000 mL | Freq: Once | INTRAVENOUS | Status: AC
  Administered 2020-12-01: 10 mL
  Filled 2020-12-01: qty 10

## 2020-12-01 MED ORDER — HEPARIN SOD (PORK) LOCK FLUSH 100 UNIT/ML IV SOLN
250.0000 [IU] | Freq: Once | INTRAVENOUS | Status: AC
  Administered 2020-12-01: 250 [IU]
  Filled 2020-12-01: qty 5

## 2020-12-01 NOTE — Telephone Encounter (Signed)
Per Marlynn Perking, called pt with message below.Pt was appreciative and verbalized understanding.

## 2020-12-01 NOTE — Telephone Encounter (Signed)
-----   Message from Owens Shark, NP sent at 12/01/2020  1:55 PM EST ----- Please let him know the white count/neutrophil count is better.  Platelets are stable at 94,000.  Call with fever, chills, other signs of infection.  Follow-up as scheduled.

## 2020-12-02 ENCOUNTER — Telehealth: Payer: Self-pay

## 2020-12-02 NOTE — Telephone Encounter (Signed)
Returned call to pt about medication refill. Dr Pardee's office notified about the request.  Prescriptions were sent in by Pardee's office. Pt notified they have been sent in.

## 2020-12-05 ENCOUNTER — Inpatient Hospital Stay: Payer: Medicare Other

## 2020-12-05 ENCOUNTER — Encounter: Payer: Self-pay | Admitting: Cardiology

## 2020-12-05 ENCOUNTER — Ambulatory Visit: Payer: Medicare Other | Admitting: Cardiology

## 2020-12-05 ENCOUNTER — Telehealth: Payer: Self-pay | Admitting: *Deleted

## 2020-12-05 ENCOUNTER — Other Ambulatory Visit: Payer: Self-pay

## 2020-12-05 VITALS — BP 166/74 | HR 73 | Temp 97.8°F | Resp 16 | Ht 70.0 in | Wt 194.4 lb

## 2020-12-05 DIAGNOSIS — C9200 Acute myeloblastic leukemia, not having achieved remission: Secondary | ICD-10-CM | POA: Diagnosis not present

## 2020-12-05 DIAGNOSIS — C9500 Acute leukemia of unspecified cell type not having achieved remission: Secondary | ICD-10-CM

## 2020-12-05 DIAGNOSIS — D696 Thrombocytopenia, unspecified: Secondary | ICD-10-CM

## 2020-12-05 DIAGNOSIS — E7800 Pure hypercholesterolemia, unspecified: Secondary | ICD-10-CM

## 2020-12-05 DIAGNOSIS — I6523 Occlusion and stenosis of bilateral carotid arteries: Secondary | ICD-10-CM

## 2020-12-05 DIAGNOSIS — I10 Essential (primary) hypertension: Secondary | ICD-10-CM

## 2020-12-05 DIAGNOSIS — I951 Orthostatic hypotension: Secondary | ICD-10-CM

## 2020-12-05 DIAGNOSIS — R42 Dizziness and giddiness: Secondary | ICD-10-CM

## 2020-12-05 DIAGNOSIS — Z452 Encounter for adjustment and management of vascular access device: Secondary | ICD-10-CM

## 2020-12-05 LAB — CBC WITH DIFFERENTIAL (CANCER CENTER ONLY)
Abs Immature Granulocytes: 0.07 10*3/uL (ref 0.00–0.07)
Basophils Absolute: 0 10*3/uL (ref 0.0–0.1)
Basophils Relative: 0 %
Eosinophils Absolute: 0 10*3/uL (ref 0.0–0.5)
Eosinophils Relative: 0 %
HCT: 36.9 % — ABNORMAL LOW (ref 39.0–52.0)
Hemoglobin: 12.2 g/dL — ABNORMAL LOW (ref 13.0–17.0)
Immature Granulocytes: 2 %
Lymphocytes Relative: 30 %
Lymphs Abs: 1 10*3/uL (ref 0.7–4.0)
MCH: 33.6 pg (ref 26.0–34.0)
MCHC: 33.1 g/dL (ref 30.0–36.0)
MCV: 101.7 fL — ABNORMAL HIGH (ref 80.0–100.0)
Monocytes Absolute: 0.8 10*3/uL (ref 0.1–1.0)
Monocytes Relative: 23 %
Neutro Abs: 1.5 10*3/uL — ABNORMAL LOW (ref 1.7–7.7)
Neutrophils Relative %: 45 %
Platelet Count: 94 10*3/uL — ABNORMAL LOW (ref 150–400)
RBC: 3.63 MIL/uL — ABNORMAL LOW (ref 4.22–5.81)
RDW: 17.2 % — ABNORMAL HIGH (ref 11.5–15.5)
WBC Count: 3.3 10*3/uL — ABNORMAL LOW (ref 4.0–10.5)
nRBC: 0 % (ref 0.0–0.2)

## 2020-12-05 LAB — CMP (CANCER CENTER ONLY)
ALT: 8 U/L (ref 0–44)
AST: 12 U/L — ABNORMAL LOW (ref 15–41)
Albumin: 3.7 g/dL (ref 3.5–5.0)
Alkaline Phosphatase: 60 U/L (ref 38–126)
Anion gap: 9 (ref 5–15)
BUN: 25 mg/dL — ABNORMAL HIGH (ref 8–23)
CO2: 24 mmol/L (ref 22–32)
Calcium: 8.7 mg/dL — ABNORMAL LOW (ref 8.9–10.3)
Chloride: 109 mmol/L (ref 98–111)
Creatinine: 0.91 mg/dL (ref 0.61–1.24)
GFR, Estimated: >60 mL/min (ref >60)
Glucose, Bld: 105 mg/dL — ABNORMAL HIGH (ref 70–99)
Potassium: 4 mmol/L (ref 3.5–5.1)
Sodium: 142 mmol/L (ref 135–145)
Total Bilirubin: 0.5 mg/dL (ref 0.3–1.2)
Total Protein: 5.9 g/dL — ABNORMAL LOW (ref 6.5–8.1)

## 2020-12-05 LAB — MAGNESIUM: Magnesium: 2 mg/dL (ref 1.7–2.4)

## 2020-12-05 MED ORDER — HYDRALAZINE HCL 25 MG PO TABS: 25.0000 mg | ORAL_TABLET | Freq: Three times a day (TID) | ORAL | 2 refills | Status: DC | PRN

## 2020-12-05 MED ORDER — HEPARIN SOD (PORK) LOCK FLUSH 100 UNIT/ML IV SOLN
250.0000 [IU] | Freq: Once | INTRAVENOUS | Status: AC
  Administered 2020-12-05: 250 [IU]
  Filled 2020-12-05: qty 5

## 2020-12-05 MED ORDER — SODIUM CHLORIDE 0.9% FLUSH
10.0000 mL | Freq: Once | INTRAVENOUS | Status: AC
  Administered 2020-12-05: 10 mL
  Filled 2020-12-05: qty 10

## 2020-12-05 NOTE — Telephone Encounter (Signed)
Faxed and called labs to [facility] per [place], who reviewed w/Sara Dralle, NP and stated he can stop the fluconazole and levofloxacin now. Asked about when he could have port (he hates the PICC) and they will discuss at his visit there on 12/08/20. Patient notified of labs and new orders as well.

## 2020-12-05 NOTE — Progress Notes (Signed)
Primary Physician/Referring:  Ronnald Nian, DO  Patient ID: Victor [name], male    DOB: [birth year], 85 y.o.   MRN: [id number]  Chief Complaint  Patient presents with  . Dizziness   HPI:    Victor [name]  is a 85 y.o. 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    Is referred to me for evaluation of dizziness. Patient was markedly dizzy while undergoing chemotherapy for CML, however patient states that over the past 2 to 3 weeks his dizziness symptoms has improved significantly and no symptoms further and states that his symptoms improved once his hemoglobin and platelet count improved. He denies any chest pain or dyspnea or symptoms of claudication.  Past Medical History:  Diagnosis Date  . Arthritis   . Atherosclerosis   . Atherosclerotic PVD with intermittent claudication (HCC)    stent left leg  . BPH (benign prostatic hyperplasia)   . Bulging lumbar disc   . Cancer ([place]) 11/12/14   vocal cord  carcinoma in situ , radiation; thyroid cancer  . Carotid bruit   . Chronic kidney disease    chronic stage III  . COPD (chronic obstructive pulmonary disease) ([place])   . DDD (degenerative disc disease), lumbar   . Dysphonia   . Dysplasia of true vocal cord   . GERD (gastroesophageal reflux disease)   . H/O carotid atherosclerosis    b/l  . Hyperlipidemia   . Hypothyroidism   . Occasional tremors    left hand managed with propranolol  . Peripheral vascular angioplasty status with implants and grafts   . Precancerous lesion 03/05/2018   premelanoma removed from back.   . Radiation 03/10/15- 04/18/16   Larynx  . Sleep apnea    hasn't used CPAP in years  . Spondylosis of lumbosacral region   . Thyroid disease     Past Surgical History:  Procedure Laterality Date  . APPENDECTOMY    . COLONOSCOPY W/ POLYPECTOMY    . DIODE LASER APPLICATION Left 26/71/2458   Procedure: DIODE LASER APPLICATION;  Surgeon: Hayden Pedro, MD;  Location: [place];  Service: Ophthalmology;  Laterality: Left;  . MEMBRANE PEEL Left 08/18/2019   Procedure: MEMBRANE PEEL;  Surgeon: Hayden Pedro, MD;  Location: [place];  Service: Ophthalmology;  Laterality: Left;  . MOUTH SURGERY     tooth extraction  . PARS PLANA VITRECTOMY Left 08/18/2019   PARS PLANA VITRECTOMY 27 GAUGE (Left)  . PARS PLANA VITRECTOMY 27 GAUGE Left 08/18/2019   Procedure: PARS PLANA VITRECTOMY 27 GAUGE;  Surgeon: Hayden Pedro, MD;  Location: [place];  Service: Ophthalmology;  Laterality: Left;  . PHOTOCOAGULATION WITH LASER Left 08/18/2019   Procedure: PHOTOCOAGULATION WITH LASER;  Surgeon: Hayden Pedro, MD;  Location: [place];  Service: Ophthalmology;  Laterality: Left;  . THYROID SURGERY    . TONSILLECTOMY    . vocal cord biopsy  11/12/14   squamous cell carcinoma in situ   Family History  Problem Relation Age of Onset  . Colon cancer Father   . Cancer Sister        Died of metastatic cancer  . Leukemia Neg Hx     Social History   Tobacco Use  . Smoking status: Former Smoker    Packs/day: 2.00  Years: 51.00    Pack years: 102.00    Types: Cigarettes    Start date: 52    Quit date: 03/05/2004    Years since quitting: 16.7  . Smokeless tobacco: Never Used  Substance Use Topics  . Alcohol use: Yes    Alcohol/week: 7.0 standard drinks    Types: 7 Standard drinks or equivalent per week    Comment: 1 drink of rum per day   Marital Status: Married  ROS  Review of Systems  Cardiovascular: Negative for chest pain, dyspnea on exertion and leg swelling.  Musculoskeletal: Positive for arthritis. Negative for muscle cramps.  Gastrointestinal: Negative for melena.   Objective  Blood pressure (!) 166/74, pulse 73, temperature 97.8 F  (36.6 C), temperature source Temporal, resp. rate 16, height 5\' 10"  (1.778 m), weight 194 lb 6.4 oz (88.2 kg), SpO2 96 %.  Vitals with BMI 12/05/2020 11/29/2020 11/24/2020  Height 5\' 10"  5\' 10"  5\' 11"   Weight 194 lbs 6 oz 190 lbs 195 lbs 10 oz  BMI 27.89 47.42 59.56  Systolic 387 564 332  Diastolic 74 50 64  Pulse 73 - 80    Orthostatic VS for the past 72 hrs (Last 3 readings):  Orthostatic BP Patient Position BP Location Cuff Size Orthostatic Pulse  12/05/20 1420 144/64 Standing Left Arm Large 86  12/05/20 1419 155/65 Sitting Left Arm Large 80  12/05/20 1417 162/72 Supine Left Arm Large 72     Physical Exam Cardiovascular:     Rate and Rhythm: Normal rate and regular rhythm.     Pulses: Intact distal pulses.          Carotid pulses are 2+ on the right side and 2+ on the left side.      Femoral pulses are 2+ on the right side and 1+ on the left side.      Popliteal pulses are 2+ on the right side and 1+ on the left side.       Dorsalis pedis pulses are 2+ on the right side and 1+ on the left side.       Posterior tibial pulses are 1+ on the right side and 0 on the left side.     Heart sounds: Normal heart sounds. No murmur heard. No gallop.      Comments: No leg edema, no JVD. Pulmonary:     Effort: Pulmonary effort is normal.     Breath sounds: Normal breath sounds.  Abdominal:     General: Bowel sounds are normal.     Palpations: Abdomen is soft.    Laboratory examination:   Recent Labs    08/05/20 0526 08/05/20 1501 08/06/20 0916 08/22/20 1014 11/21/20 1446 11/28/20 0936 12/05/20 1040  NA 142 142 142   < > 142 142 142  K 3.7 3.8 3.8   < > 4.1 3.6 4.0  CL 108 109 109   < > 108 110 109  CO2 19* 20* 23   < > 27 24 24   GLUCOSE 108* 113* 116*   < > 108* 103* 105*  BUN 48* 43* 30*   < > 17 18 25*  CREATININE 2.06* 1.71* 1.43*   < > 0.96 1.06 0.91  CALCIUM 6.5* 6.6* 6.5*   < > 8.5* 8.4* 8.7*  GFRNONAA 29* 36* 45*   < > >60 >60 >60  GFRAA 34* 42* 52*  --   --   --    --    < > = values in this  interval not displayed.   estimated creatinine clearance is 70 mL/min (by C-G formula based on SCr of 0.91 mg/dL).  CMP Latest Ref Rng & Units 12/05/2020 11/28/2020 11/21/2020  Glucose 70 - 99 mg/dL 105(H) 103(H) 108(H)  BUN 8 - 23 mg/dL 25(H) 18 17  Creatinine 0.61 - 1.24 mg/dL 0.91 1.06 0.96  Sodium 135 - 145 mmol/L 142 142 142  Potassium 3.5 - 5.1 mmol/L 4.0 3.6 4.1  Chloride 98 - 111 mmol/L 109 110 108  CO2 22 - 32 mmol/L 24 24 27   Calcium 8.9 - 10.3 mg/dL 8.7(L) 8.4(L) 8.5(L)  Total Protein 6.5 - 8.1 g/dL 5.9(L) 5.5(L) 5.6(L)  Total Bilirubin 0.3 - 1.2 mg/dL 0.5 0.5 0.7  Alkaline Phos 38 - 126 U/L 60 63 50  AST 15 - 41 U/L 12(L) 11(L) 16  ALT 0 - 44 U/L 8 6 11    CBC Latest Ref Rng & Units 12/05/2020 12/01/2020 11/28/2020  WBC 4.0 - 10.5 K/uL 3.3(L) 1.9(L) 1.0(L)  Hemoglobin 13.0 - 17.0 g/dL 12.2(L) 11.6(L) 11.3(L)  Hematocrit 39.0 - 52.0 % 36.9(L) 34.3(L) 34.3(L)  Platelets 150 - 400 K/uL 94(L) 94(L) 94(L)    Lipid Panel Cholesterol, total 139.000 m 11/12/2018 HDL 37.700 mg 11/12/2018 LDL 71.000 mg 11/12/2018 Triglycerides 149.000 m 11/12/2018    HEMOGLOBIN A1C No results found for: HGBA1C, MPG TSH No results for input(s): TSH in the last 8760 hours.  External labs:   Labs 12/05/2020:  Hb 12.2/HCT 36.9, WBC 3.3, platelets 94. Microcytic indicis.  Sodium 142, potassium 4.0, BUN 25, creatinine 0.91, CMP otherwise normal. Serum glucose 105 mg.  Labs 11/09/2020:  TSH normal.  Medications and allergies  No Known Allergies   Outpatient Medications Prior to Visit  Medication Sig Dispense Refill  . acetaminophen (TYLENOL) 500 MG tablet Take 1,000 mg by mouth every 6 (six) hours as needed.    Marland Kitchen acyclovir (ZOVIRAX) 400 MG tablet Take 1 tablet (400 mg total) by mouth 2 (two) times daily as needed. 60 tablet 2  . albuterol (VENTOLIN HFA) 108 (90 Base) MCG/ACT inhaler Inhale 1-2 puffs into the lungs every 6 (six) hours as needed for wheezing or shortness  of breath.    . doxazosin (CARDURA) 2 MG tablet Take 1 tablet (2 mg total) by mouth at bedtime. 90 tablet 3  . fluticasone (FLONASE) 50 MCG/ACT nasal spray Place 2 sprays into the nose 2 (two) times daily as needed.    Marland Kitchen HEMP OIL-VANILLYL BUTYL ETHER EX Apply topically daily.    Marland Kitchen HYDROcodone-acetaminophen (NORCO) 5-325 MG tablet Take 1 tablet by mouth every 6 (six) hours as needed for moderate pain. 40 tablet 0  . ibuprofen (ADVIL) 200 MG tablet Take 400 mg by mouth every 6 (six) hours as needed.    Marland Kitchen levothyroxine (SYNTHROID) 175 MCG tablet Take 175 mcg by mouth daily.    . Loratadine (CLARITIN PO) Take 10 mg by mouth at bedtime. Allertec    . Melatonin 5 MG CHEW Chew 1 tablet by mouth daily as needed.     Marland Kitchen omeprazole (PRILOSEC) 20 MG capsule Take 1 capsule (20 mg total) by mouth 2 (two) times daily before a meal. 180 capsule 3  . potassium chloride (MICRO-K) 10 MEQ CR capsule Take 10 mEq by mouth in the morning and at bedtime.    . propranolol ER (INDERAL LA) 80 MG 24 hr capsule Take 1 capsule (80 mg total) by mouth daily. 90 capsule 3  . simvastatin (ZOCOR) 40 MG tablet Take 1 tablet (  40 mg total) by mouth at bedtime. 90 tablet 3  . venetoclax 100 MG TABS Take 200 mg by mouth daily.    . fluconazole (DIFLUCAN) 200 MG tablet Take 200 mg by mouth daily as needed. (Patient not taking: Reported on 12/05/2020)    . furosemide (LASIX) 40 MG tablet Take 20 mg by mouth daily.    Marland Kitchen levofloxacin (LEVAQUIN) 500 MG tablet Take 500 mg by mouth daily.     Facility-Administered Medications Prior to Visit  Medication Dose Route Frequency Provider Last Rate Last Admin  . sodium chloride flush (NS) 0.9 % injection 10 mL  10 mL Intracatheter PRN Ladell Pier, MD   10 mL at 11/03/20 1625    Radiology:   MRI of the brain 09/06/2020: Chronic microvascular ischemic disease. Remote lacunar infarct left cerebellar hemisphere.  CTA Head & Neck 09/16/2020: 1. No evidence of acute intracranial abnormality.  However, CT is relatively insensitive for the detection of acute infarct within the first 24-48 hours, and MRI may be indicated if there is high clinical suspicion.  2. Multifocal high-grade stenosis versus occlusion of the nondominant right vertebral artery, which although likely chronic could be related to the reported complaint of dizziness. Consider neurovascular referral for further assessment.  3. Lobulated soft tissue density lesion interposed between the right levator scapula and trapezius muscles measuring up to 2.2 cm, which is favored to represent a venous malformation with associated phlebolith and could be further evaluated with ultrasound.   Aortic arch: No significant stenosis of the origins of the major arch vessels.  Left carotid system: Atherosclerotic calcification at the common carotidartery bifurcation without evidence of significant (50% or greater) stenosis or occlusion.  Right carotid system: Atherosclerotic calcification at the common carotid artery bifurcation without evidence of significant (50% or greater) stenosis or occlusion.  Vertebral arteries: Left dominant. Multifocal high-grade stenosis versus occlusion, including areas of nonopacification of the V2, V3, and V4 segments, of the nondominant right vertebral artery. Mild to moderate atherosclerotic narrowing at the origin of the left vertebral artery without substantial narrowing distally.   Cardiac Studies:   Exercise Treadmill Stress Test 05/26/2018: Indication: CP  The patient exercised on Bruce protocol for 6:00 min. Patient achieved 7.05 METS and reached HR 107 bpm, which is 76% of maximum age-predicted HR. Stress test terminated due to Fatigue.  Exercise capacity was below average for age. HR Response to Exercise: Attenuated secondary to medication. BP Response to Exercise: Normal resting BP- exaggerated response. Peak BP 200/80 mmHg Chest Pain: none. Arrhythmias: none. ST Changes: With peak exercise there  was no ST-T changes of ischemia.  Overall Impression: Inadequate subotpimal stress test Consider further cardiac evaluation including cardiac catheterization if clinically indicated.  ABD aortic duplex 10/16/2018: Mild ectasia involving mid and distal aorta. Normal iliac artery velocity. Mixed plaque noted.  LE Doppler  10/16/2018: Indication: Bilateral common iliac artery stent and left external iliac artery stent by Dr. Denyce Robert Moderate velocity increase at the right posterior tibial artery suggestive of > 50% stenosis. Moderate velocity increase at the left distal superficial femoral and posterior tibial arteries suggestive of >50% stenosis. Normal triphasic waveforms of both ankles. Mildly decreased bilateral resting ABI at 0.93.  Carotid artery duplex 05/04/2020: Right Carotid: Velocities in the right ICA are consistent with a 1-39% stenosis.                Non-hemodynamically significant plaque <50% noted in the CCA.  Left Carotid: Velocities in the left ICA are consistent  with a 1-39% stenosis.               Non-hemodynamically significant plaque <50% noted in the CCA.  Vertebrals:  Left vertebral artery demonstrates antegrade flow. Right vertebral              artery demonstrates high resistant flow. Minimum flow noted in the              vertebral artery. High resistant waveform is evident suggesting              distal occlusion vs high grade stenosis. Subclavians: Right subclavian artery flow was disturbed. Normal flow              hemodynamics were seen in the left subclavian artery.  Echocardiogram 07/17/2019:  Left ventricle cavity is normal in size. Mild concentric hypertrophy of the left ventricle. Normal LV systolic function with EF 57%. Normal global wall motion. Doppler evidence of grade I (impaired) diastolic dysfunction, normal LAP.  Moderate (Grade II) mitral regurgitation. Inadequate TR jet to estimate pulmonary artery systolic pressure. Normal right atrial  pressure.  EKG:    EKG 12/05/2020: Probably sinus rhythm at the rate of 77 bpm, cannot exclude ectopic atrial rhythm.  Normal axis, early repolarization.  Low-voltage complexes.    Assessment     ICD-10-CM   1. Dizziness  R42 EKG 12-Lead  2. Orthostatic hypotension  I95.1   3. Primary hypertension  I10 hydrALAZINE (APRESOLINE) 25 MG tablet  4. Hypercholesteremia  E78.00   5. Asymptomatic bilateral carotid artery stenosis  I65.23   6. Thrombocytopenia (HCC)  D69.6     Medications Discontinued During This Encounter  Medication Reason  . furosemide (LASIX) 40 MG tablet No longer needed (for PRN medications)  . levofloxacin (LEVAQUIN) 500 MG tablet Discontinued by provider  . fluconazole (DIFLUCAN) 200 MG tablet Error    Meds ordered this encounter  Medications  . hydrALAZINE (APRESOLINE) 25 MG tablet    Sig: Take 1 tablet (25 mg total) by mouth 3 (three) times daily as needed. Take if standing BP >140/80 mm Hg    Dispense:  90 tablet    Refill:  2   Orders Placed This Encounter  Procedures  . EKG 12-Lead   Recommendations:   Victor [name] is a 85 y.o. 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    While undergoing chemotherapy for CML, he had markedly reduced hemoglobin and also platelet count, patient was experiencing several episodes of dizziness.  Unfortunately orthostatics were not previously performed and extensive evaluation led to diagnosis of right vertebral artery stenosis which is nondominant.  I do not suspect vascular etiology for his dizziness, I reviewed his external CT angiogram reports and external labs, suspect orthostatic hypotension to be the etiology especially while undergoing chemotherapy. It is a  nondominant vertebral artery and symptoms were clearly orthostatic with change in position.  His symptoms of dizziness has completely resolved in the past 3 weeks.  He was mildly orthostatic today.  Although blood pressure was elevated I do not want to make any changes on a permanent basis however I did prescribe him hydralazine to be taken 3 times daily as needed for blood pressure >140/80 mmHg on standing.  In view of remote left cerebellar infarct noted on MRI, moderate bilateral  carotid artery disease, known peripheral artery disease, he needs to be on aspirin however patient has developed severe thrombocytopenia and hence presently not on any anticoagulants or antiplatelet therapy. Advised him to discuss this with his oncologist, no urgency in starting antiplatelet therapy. I would like to see the patient back in 3 months for follow-up. This was a complex 40-minute encounter in reviewing his complex medical history, updating his medical records and labs and review of external records.  He does have peripheral arterial disease and his vascular examination does reveal reduced left lower extremity pulse however he is completely asymptomatic and capillary refill is <2 seconds, no clinical evidence of critical limb ischemia and there is no lifestyle limiting claudication. In view of his underlying medical issues, would recommend continued medical therapy. Any vascular angiography or angioplasty may be complicated by the fact that he did not tolerate antiplatelet therapy or anticoagulants.  Lipids are well controlled.     [name], MD, [facility] 12/05/2020, 9:08 PM Office: [phone]

## 2020-12-06 ENCOUNTER — Ambulatory Visit: Payer: Medicare Other | Admitting: Cardiology

## 2020-12-09 ENCOUNTER — Other Ambulatory Visit: Payer: Self-pay

## 2020-12-09 DIAGNOSIS — C9500 Acute leukemia of unspecified cell type not having achieved remission: Secondary | ICD-10-CM

## 2020-12-12 ENCOUNTER — Other Ambulatory Visit: Payer: Self-pay

## 2020-12-12 ENCOUNTER — Inpatient Hospital Stay: Payer: Medicare Other | Attending: Oncology

## 2020-12-12 ENCOUNTER — Inpatient Hospital Stay: Payer: Medicare Other

## 2020-12-12 ENCOUNTER — Encounter: Payer: Self-pay | Admitting: Nurse Practitioner

## 2020-12-12 ENCOUNTER — Inpatient Hospital Stay (HOSPITAL_BASED_OUTPATIENT_CLINIC_OR_DEPARTMENT_OTHER): Payer: Medicare Other | Admitting: Nurse Practitioner

## 2020-12-12 VITALS — BP 113/55 | HR 76 | Temp 98.6°F | Resp 18 | Ht 70.0 in | Wt 195.0 lb

## 2020-12-12 DIAGNOSIS — J9690 Respiratory failure, unspecified, unspecified whether with hypoxia or hypercapnia: Secondary | ICD-10-CM | POA: Diagnosis not present

## 2020-12-12 DIAGNOSIS — C9200 Acute myeloblastic leukemia, not having achieved remission: Secondary | ICD-10-CM | POA: Diagnosis not present

## 2020-12-12 DIAGNOSIS — N179 Acute kidney failure, unspecified: Secondary | ICD-10-CM | POA: Insufficient documentation

## 2020-12-12 DIAGNOSIS — E890 Postprocedural hypothyroidism: Secondary | ICD-10-CM | POA: Diagnosis not present

## 2020-12-12 DIAGNOSIS — C9500 Acute leukemia of unspecified cell type not having achieved remission: Secondary | ICD-10-CM

## 2020-12-12 DIAGNOSIS — R58 Hemorrhage, not elsewhere classified: Secondary | ICD-10-CM | POA: Diagnosis not present

## 2020-12-12 DIAGNOSIS — Z5111 Encounter for antineoplastic chemotherapy: Secondary | ICD-10-CM | POA: Diagnosis present

## 2020-12-12 DIAGNOSIS — D61818 Other pancytopenia: Secondary | ICD-10-CM | POA: Insufficient documentation

## 2020-12-12 DIAGNOSIS — E8770 Fluid overload, unspecified: Secondary | ICD-10-CM | POA: Diagnosis not present

## 2020-12-12 DIAGNOSIS — Z79899 Other long term (current) drug therapy: Secondary | ICD-10-CM | POA: Diagnosis not present

## 2020-12-12 DIAGNOSIS — Z452 Encounter for adjustment and management of vascular access device: Secondary | ICD-10-CM

## 2020-12-12 LAB — CMP (CANCER CENTER ONLY)
ALT: 9 U/L (ref 0–44)
AST: 11 U/L — ABNORMAL LOW (ref 15–41)
Albumin: 3.5 g/dL (ref 3.5–5.0)
Alkaline Phosphatase: 54 U/L (ref 38–126)
Anion gap: 8 (ref 5–15)
BUN: 22 mg/dL (ref 8–23)
CO2: 25 mmol/L (ref 22–32)
Calcium: 8.3 mg/dL — ABNORMAL LOW (ref 8.9–10.3)
Chloride: 109 mmol/L (ref 98–111)
Creatinine: 0.86 mg/dL (ref 0.61–1.24)
GFR, Estimated: >60 mL/min (ref >60)
Glucose, Bld: 109 mg/dL — ABNORMAL HIGH (ref 70–99)
Potassium: 3.9 mmol/L (ref 3.5–5.1)
Sodium: 142 mmol/L (ref 135–145)
Total Bilirubin: 0.5 mg/dL (ref 0.3–1.2)
Total Protein: 5.2 g/dL — ABNORMAL LOW (ref 6.5–8.1)

## 2020-12-12 LAB — CBC WITH DIFFERENTIAL (CANCER CENTER ONLY)
Abs Immature Granulocytes: 0.05 10*3/uL (ref 0.00–0.07)
Basophils Absolute: 0 10*3/uL (ref 0.0–0.1)
Basophils Relative: 1 %
Eosinophils Absolute: 0 10*3/uL (ref 0.0–0.5)
Eosinophils Relative: 0 %
HCT: 35.4 % — ABNORMAL LOW (ref 39.0–52.0)
Hemoglobin: 11.9 g/dL — ABNORMAL LOW (ref 13.0–17.0)
Immature Granulocytes: 1 %
Lymphocytes Relative: 23 %
Lymphs Abs: 1.1 10*3/uL (ref 0.7–4.0)
MCH: 34.3 pg — ABNORMAL HIGH (ref 26.0–34.0)
MCHC: 33.6 g/dL (ref 30.0–36.0)
MCV: 102 fL — ABNORMAL HIGH (ref 80.0–100.0)
Monocytes Absolute: 0.7 10*3/uL (ref 0.1–1.0)
Monocytes Relative: 15 %
Neutro Abs: 2.9 10*3/uL (ref 1.7–7.7)
Neutrophils Relative %: 60 %
Platelet Count: 52 10*3/uL — ABNORMAL LOW (ref 150–400)
RBC: 3.47 MIL/uL — ABNORMAL LOW (ref 4.22–5.81)
RDW: 16.3 % — ABNORMAL HIGH (ref 11.5–15.5)
WBC Count: 4.9 10*3/uL (ref 4.0–10.5)
nRBC: 0 % (ref 0.0–0.2)

## 2020-12-12 LAB — MAGNESIUM: Magnesium: 2 mg/dL (ref 1.7–2.4)

## 2020-12-12 MED ORDER — AZACITIDINE CHEMO INJECTION 100 MG
75.0000 mg/m2 | Freq: Once | INTRAMUSCULAR | Status: AC
  Administered 2020-12-12: 155 mg via INTRAVENOUS
  Filled 2020-12-12: qty 15.5

## 2020-12-12 MED ORDER — HEPARIN SOD (PORK) LOCK FLUSH 100 UNIT/ML IV SOLN
250.0000 [IU] | Freq: Once | INTRAVENOUS | Status: AC
  Administered 2020-12-12: 250 [IU]
  Filled 2020-12-12: qty 5

## 2020-12-12 MED ORDER — HEPARIN SOD (PORK) LOCK FLUSH 100 UNIT/ML IV SOLN
250.0000 [IU] | Freq: Once | INTRAVENOUS | Status: AC | PRN
  Administered 2020-12-12: 250 [IU]
  Filled 2020-12-12: qty 5

## 2020-12-12 MED ORDER — SODIUM CHLORIDE 0.9% FLUSH
10.0000 mL | INTRAVENOUS | Status: DC | PRN
  Administered 2020-12-12: 10 mL
  Filled 2020-12-12: qty 10

## 2020-12-12 MED ORDER — SODIUM CHLORIDE 0.9 % IV SOLN
Freq: Once | INTRAVENOUS | Status: AC
Start: 2020-12-12 — End: 2020-12-12
  Filled 2020-12-12: qty 250

## 2020-12-12 MED ORDER — SODIUM CHLORIDE 0.9% FLUSH
10.0000 mL | Freq: Once | INTRAVENOUS | Status: AC
  Administered 2020-12-12: 10 mL
  Filled 2020-12-12: qty 10

## 2020-12-12 MED ORDER — PALONOSETRON HCL INJECTION 0.25 MG/5ML
INTRAVENOUS | Status: AC
  Filled 2020-12-12: qty 5

## 2020-12-12 MED ORDER — PALONOSETRON HCL INJECTION 0.25 MG/5ML
0.2500 mg | Freq: Once | INTRAVENOUS | Status: AC
  Administered 2020-12-12: 0.25 mg via INTRAVENOUS

## 2020-12-12 MED ORDER — SODIUM CHLORIDE 0.9 % IV SOLN
10.0000 mg | Freq: Once | INTRAVENOUS | Status: AC
  Administered 2020-12-12: 10 mg via INTRAVENOUS
  Filled 2020-12-12: qty 10

## 2020-12-12 NOTE — Progress Notes (Signed)
MD states okay to treat with PLT 52

## 2020-12-12 NOTE — Progress Notes (Addendum)
[place] OFFICE PROGRESS NOTE   Diagnosis: AML  INTERVAL HISTORY:   Victor [name] returns as scheduled.  He completed cycle three 5-azacytidine/venetoclax beginning 10/31/2020.  Overall he is feeling well.  No fever, cough, shortness of breath.  No bleeding.  No nausea or vomiting.  No diarrhea.  Objective:  Vital signs in last 24 hours:  Blood pressure (!) 113/55, pulse 76, temperature 98.6 F (37 C), temperature source Tympanic, resp. rate 18, height 5' 10" (1.778 m), weight 195 lb (88.5 kg), SpO2 98 %.    HEENT: No thrush or ulcers. Resp: Lungs clear bilaterally. Cardio: Regular rate and rhythm. GI: No hepatosplenomegaly. Vascular: No leg edema. Neuro: Alert and oriented. Skin: Small ecchymoses scattered over the forearms. Right upper extremity PICC without erythema.   Lab Results:  Lab Results  Component Value Date   WBC 4.9 12/12/2020   HGB 11.9 (L) 12/12/2020   HCT 35.4 (L) 12/12/2020   MCV 102.0 (H) 12/12/2020   PLT 52 (L) 12/12/2020   NEUTROABS 2.9 12/12/2020    Imaging:  No results found.  Medications: I have reviewed the patient's current medications.  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, FLT3-ITD mutation positive  Cycle 1 azacytidine and venetoclax August 09, 2020;venetoclax dose reduced to 200 mg beginning 08/22/2020  Bone marrow biopsy 09/01/2020-no increase in blast cells  Cycle 2 azacitidine and venetoclax September 19, 2020, venetoclax 400 mg daily 21 days, change to 200 mg daily when he began Diflucan/Levaquin prophylaxis  Cycle 3 azacytidine and venetoclax 10/31/2020, venetoclax 400 mg daily for 14 days  Cycle 4 azacitidine and venetoclax 12/12/2020, venetoclax  400 mg daily for 14 days  2.Pancytopenia secondary to #1 and chemotherapy 3.Right PICC placed August 08, 2020 4.TTE August 08, 2020-EF 60-65% 5.Fever during hospital admission October 2021, suspicion of pneumonia, treated with cefepime and discharged on Levaquin 6.Hypervolemia with respiratory failure during hospital admission October 2021-diuresis, home Lasix 7.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  He has completed 3 cycles of azacitidine and venetoclax.  Plan to proceed with cycle 4 today as scheduled.  We reviewed the CBC from today.  Counts adequate to proceed with treatment.  He has moderate thrombocytopenia.  He understands to contact the office with bleeding.  He will have a repeat CBC on 12/15/2020 and again on 12/19/2020.  He is currently scheduled for Port-A-Cath placement at [facility] on 01/16/2021.  He would like to have a Port-A-Cath placed in [place].  We made a referral to interventional radiology.  Covid booster today.  He will return for lab and follow-up on 12/26/2020.  He will contact the office in the interim with any problems.  Patient seen with Dr. Benay Spice.     [name] ANP/GNP-BC   12/12/2020  2:47 PM This was a shared visit with [name].  The plan is to proceed with cycle 4 5 azacytidine and venetoclax today as recommended by Dr. Linus Orn.  The PICC will be removed when his counts have recovered.  A Port-A-Cath will be placed prior to cycle 5.  The platelets remain moderately decreased.  He will call for bleeding.  We will provide platelet transfusion support as needed.  I was present for greater than 50%  of today's visit.  I performed medical decision making. [name], MD

## 2020-12-13 ENCOUNTER — Inpatient Hospital Stay: Payer: Medicare Other

## 2020-12-13 ENCOUNTER — Inpatient Hospital Stay (HOSPITAL_BASED_OUTPATIENT_CLINIC_OR_DEPARTMENT_OTHER): Payer: Medicare Other

## 2020-12-13 ENCOUNTER — Other Ambulatory Visit: Payer: Self-pay

## 2020-12-13 VITALS — BP 130/69 | HR 75 | Temp 98.4°F | Resp 16

## 2020-12-13 DIAGNOSIS — C9500 Acute leukemia of unspecified cell type not having achieved remission: Secondary | ICD-10-CM

## 2020-12-13 DIAGNOSIS — C9200 Acute myeloblastic leukemia, not having achieved remission: Secondary | ICD-10-CM | POA: Diagnosis not present

## 2020-12-13 DIAGNOSIS — Z23 Encounter for immunization: Secondary | ICD-10-CM | POA: Diagnosis not present

## 2020-12-13 MED ORDER — SODIUM CHLORIDE 0.9 % IV SOLN
Freq: Once | INTRAVENOUS | Status: AC
  Filled 2020-12-13: qty 250

## 2020-12-13 MED ORDER — AZACITIDINE CHEMO INJECTION 100 MG
75.0000 mg/m2 | Freq: Once | INTRAMUSCULAR | Status: AC
  Administered 2020-12-13: 155 mg via INTRAVENOUS
  Filled 2020-12-13: qty 15.5

## 2020-12-13 MED ORDER — SODIUM CHLORIDE 0.9% FLUSH
10.0000 mL | INTRAVENOUS | Status: DC | PRN
  Administered 2020-12-13: 10 mL
  Filled 2020-12-13: qty 10

## 2020-12-13 MED ORDER — HEPARIN SOD (PORK) LOCK FLUSH 100 UNIT/ML IV SOLN
500.0000 [IU] | Freq: Once | INTRAVENOUS | Status: AC | PRN
  Administered 2020-12-13: 500 [IU]
  Filled 2020-12-13: qty 5

## 2020-12-13 MED ORDER — SODIUM CHLORIDE 0.9 % IV SOLN
10.0000 mg | Freq: Once | INTRAVENOUS | Status: AC
  Administered 2020-12-13: 10 mg via INTRAVENOUS
  Filled 2020-12-13: qty 10

## 2020-12-13 NOTE — Patient Instructions (Addendum)
[facility] Discharge Instructions for Patients Receiving Chemotherapy  Today you received the following chemotherapy agent: Azacitidine (Vidaza)  To help prevent nausea and vomiting after your treatment, we encourage you to take your nausea medication as directed by your MD.   If you develop nausea and vomiting that is not controlled by your nausea medication, call the clinic.   BELOW ARE SYMPTOMS THAT SHOULD BE REPORTED IMMEDIATELY:  *FEVER GREATER THAN 100.5 F  *CHILLS WITH OR WITHOUT FEVER  NAUSEA AND VOMITING THAT IS NOT CONTROLLED WITH YOUR NAUSEA MEDICATION  *UNUSUAL SHORTNESS OF BREATH  *UNUSUAL BRUISING OR BLEEDING  TENDERNESS IN MOUTH AND THROAT WITH OR WITHOUT PRESENCE OF ULCERS  *URINARY PROBLEMS  *BOWEL PROBLEMS  UNUSUAL RASH Items with * indicate a potential emergency and should be followed up as soon as possible.  Feel free to call the clinic should you have any questions or concerns. The clinic phone number is [phone].  Please show the CHEMO ALERT CARD at check-in to the Emergency Department and triage nurse.   

## 2020-12-13 NOTE — Progress Notes (Signed)
   Covid-19 Vaccination Clinic  Name:  Victor [name]    MRN: [id number] DOB: [birth year]  12/13/2020  Victor [name] was observed post Covid-19 immunization for 15  Minutes without incident. He was provided with Vaccine Information Sheet and instruction to access the V-Safe system.   Victor [name] was instructed to call 911 with any severe reactions post vaccine: Marland Kitchen Difficulty breathing  . Swelling of face and throat  . A fast heartbeat  . A bad rash all over body  . Dizziness and weakness

## 2020-12-14 ENCOUNTER — Inpatient Hospital Stay: Payer: Medicare Other

## 2020-12-14 ENCOUNTER — Telehealth: Payer: Self-pay | Admitting: Nurse Practitioner

## 2020-12-14 ENCOUNTER — Other Ambulatory Visit: Payer: Self-pay

## 2020-12-14 VITALS — BP 142/55 | HR 65 | Temp 97.8°F | Resp 18

## 2020-12-14 DIAGNOSIS — C9500 Acute leukemia of unspecified cell type not having achieved remission: Secondary | ICD-10-CM

## 2020-12-14 DIAGNOSIS — C9200 Acute myeloblastic leukemia, not having achieved remission: Secondary | ICD-10-CM | POA: Diagnosis not present

## 2020-12-14 MED ORDER — SODIUM CHLORIDE 0.9 % IV SOLN
75.0000 mg/m2 | Freq: Once | INTRAVENOUS | Status: AC
  Administered 2020-12-14: 155 mg via INTRAVENOUS
  Filled 2020-12-14: qty 15.5

## 2020-12-14 MED ORDER — SODIUM CHLORIDE 0.9 % IV SOLN
Freq: Once | INTRAVENOUS | Status: AC
  Filled 2020-12-14: qty 250

## 2020-12-14 MED ORDER — SODIUM CHLORIDE 0.9 % IV SOLN
10.0000 mg | Freq: Once | INTRAVENOUS | Status: AC
  Administered 2020-12-14: 10 mg via INTRAVENOUS
  Filled 2020-12-14: qty 10

## 2020-12-14 MED ORDER — PALONOSETRON HCL INJECTION 0.25 MG/5ML
0.2500 mg | Freq: Once | INTRAVENOUS | Status: AC
  Administered 2020-12-14: 0.25 mg via INTRAVENOUS

## 2020-12-14 MED ORDER — SODIUM CHLORIDE 0.9% FLUSH
10.0000 mL | INTRAVENOUS | Status: DC | PRN
  Administered 2020-12-14: 10 mL
  Filled 2020-12-14: qty 10

## 2020-12-14 MED ORDER — PALONOSETRON HCL INJECTION 0.25 MG/5ML
INTRAVENOUS | Status: AC
  Filled 2020-12-14: qty 5

## 2020-12-14 MED ORDER — HEPARIN SOD (PORK) LOCK FLUSH 100 UNIT/ML IV SOLN
250.0000 [IU] | Freq: Once | INTRAVENOUS | Status: AC | PRN
  Administered 2020-12-14: 250 [IU]
  Filled 2020-12-14: qty 5

## 2020-12-14 NOTE — Patient Instructions (Signed)
[facility] Discharge Instructions for Patients Receiving Chemotherapy  Today you received the following chemotherapy agent: Azacitidine (Vidaza)  To help prevent nausea and vomiting after your treatment, we encourage you to take your nausea medication as directed by your MD.   If you develop nausea and vomiting that is not controlled by your nausea medication, call the clinic.   BELOW ARE SYMPTOMS THAT SHOULD BE REPORTED IMMEDIATELY:  *FEVER GREATER THAN 100.5 F  *CHILLS WITH OR WITHOUT FEVER  NAUSEA AND VOMITING THAT IS NOT CONTROLLED WITH YOUR NAUSEA MEDICATION  *UNUSUAL SHORTNESS OF BREATH  *UNUSUAL BRUISING OR BLEEDING  TENDERNESS IN MOUTH AND THROAT WITH OR WITHOUT PRESENCE OF ULCERS  *URINARY PROBLEMS  *BOWEL PROBLEMS  UNUSUAL RASH Items with * indicate a potential emergency and should be followed up as soon as possible.  Feel free to call the clinic should you have any questions or concerns. The clinic phone number is [phone].  Please show the CHEMO ALERT CARD at check-in to the Emergency Department and triage nurse.   

## 2020-12-14 NOTE — Telephone Encounter (Signed)
Scheduled and cancelled appointments per 2/7 los. Called patient, no answer. Left message with updated appointments dates and times.

## 2020-12-15 ENCOUNTER — Inpatient Hospital Stay: Payer: Medicare Other

## 2020-12-15 ENCOUNTER — Other Ambulatory Visit: Payer: Self-pay

## 2020-12-15 ENCOUNTER — Ambulatory Visit: Payer: Medicare Other | Admitting: Cardiology

## 2020-12-15 ENCOUNTER — Other Ambulatory Visit: Payer: Medicare Other

## 2020-12-15 VITALS — BP 143/63 | HR 62 | Temp 98.0°F | Resp 18

## 2020-12-15 DIAGNOSIS — C9500 Acute leukemia of unspecified cell type not having achieved remission: Secondary | ICD-10-CM

## 2020-12-15 DIAGNOSIS — C9200 Acute myeloblastic leukemia, not having achieved remission: Secondary | ICD-10-CM | POA: Diagnosis not present

## 2020-12-15 DIAGNOSIS — Z452 Encounter for adjustment and management of vascular access device: Secondary | ICD-10-CM

## 2020-12-15 LAB — CBC WITH DIFFERENTIAL (CANCER CENTER ONLY)
Abs Immature Granulocytes: 0.05 10*3/uL (ref 0.00–0.07)
Basophils Absolute: 0 10*3/uL (ref 0.0–0.1)
Basophils Relative: 0 %
Eosinophils Absolute: 0 10*3/uL (ref 0.0–0.5)
Eosinophils Relative: 0 %
HCT: 35 % — ABNORMAL LOW (ref 39.0–52.0)
Hemoglobin: 11.8 g/dL — ABNORMAL LOW (ref 13.0–17.0)
Immature Granulocytes: 1 %
Lymphocytes Relative: 9 %
Lymphs Abs: 0.6 10*3/uL — ABNORMAL LOW (ref 0.7–4.0)
MCH: 34 pg (ref 26.0–34.0)
MCHC: 33.7 g/dL (ref 30.0–36.0)
MCV: 100.9 fL — ABNORMAL HIGH (ref 80.0–100.0)
Monocytes Absolute: 0.4 10*3/uL (ref 0.1–1.0)
Monocytes Relative: 7 %
Neutro Abs: 5.4 10*3/uL (ref 1.7–7.7)
Neutrophils Relative %: 83 %
Platelet Count: 51 10*3/uL — ABNORMAL LOW (ref 150–400)
RBC: 3.47 MIL/uL — ABNORMAL LOW (ref 4.22–5.81)
RDW: 16 % — ABNORMAL HIGH (ref 11.5–15.5)
WBC Count: 6.5 10*3/uL (ref 4.0–10.5)
nRBC: 0 % (ref 0.0–0.2)

## 2020-12-15 MED ORDER — SODIUM CHLORIDE 0.9 % IV SOLN
10.0000 mg | Freq: Once | INTRAVENOUS | Status: AC
  Administered 2020-12-15: 10 mg via INTRAVENOUS
  Filled 2020-12-15: qty 10

## 2020-12-15 MED ORDER — HEPARIN SOD (PORK) LOCK FLUSH 100 UNIT/ML IV SOLN
250.0000 [IU] | Freq: Once | INTRAVENOUS | Status: AC | PRN
  Administered 2020-12-15: 250 [IU]
  Filled 2020-12-15: qty 5

## 2020-12-15 MED ORDER — AZACITIDINE CHEMO INJECTION 100 MG
75.0000 mg/m2 | Freq: Once | INTRAMUSCULAR | Status: AC
  Administered 2020-12-15: 155 mg via INTRAVENOUS
  Filled 2020-12-15: qty 15.5

## 2020-12-15 MED ORDER — SODIUM CHLORIDE 0.9% FLUSH
10.0000 mL | INTRAVENOUS | Status: DC | PRN
  Administered 2020-12-15: 10 mL
  Filled 2020-12-15: qty 10

## 2020-12-15 MED ORDER — SODIUM CHLORIDE 0.9 % IV SOLN
Freq: Once | INTRAVENOUS | Status: AC
Start: 2020-12-15 — End: 2020-12-15
  Filled 2020-12-15: qty 250

## 2020-12-15 MED ORDER — SODIUM CHLORIDE 0.9% FLUSH
10.0000 mL | Freq: Once | INTRAVENOUS | Status: AC
  Administered 2020-12-15: 10 mL
  Filled 2020-12-15: qty 10

## 2020-12-15 NOTE — Progress Notes (Signed)
Per Dr. Benay Spice, ok to treat with platelets of 51.

## 2020-12-15 NOTE — Patient Instructions (Addendum)
PICC Home Care Guide A peripherally inserted central catheter (PICC) is a form of IV access that allows medicines and IV fluids to be quickly distributed throughout the body. The PICC is a long, thin, flexible tube (catheter) that is inserted into a vein in the upper arm. The catheter ends in a large vein in the chest (superior vena cava, or SVC). After the PICC is inserted, a chest X-ray may be done to make sure that it is in the correct place. A PICC may be placed for different reasons, such as:  To give medicines and liquid nutrition.  To give IV fluids and blood products.  If there is trouble placing a peripheral intravenous (PIV) catheter. If taken care of properly, a PICC can remain in place for several months. Having a PICC can also allow a person to go home from the hospital sooner. Medicine and PICC care can be managed at home by a family member, caregiver, or home health care team. What are the risks? Generally, having a PICC is safe. However, problems may occur, including:  A blood clot (thrombus) forming in or at the tip of the PICC.  A blood clot forming in a vein (deep vein thrombosis) or traveling to the lung (pulmonary embolism).  Inflammation of the vein (phlebitis) in which the PICC is placed.  Infection. Central line associated blood stream infection (CLABSI) is a serious infection that often requires hospitalization.  PICC movement (malposition). The PICC tip may move from its original position due to excessive physical activity, forceful coughing, sneezing, or vomiting.  A break or cut in the PICC. It is important not to use scissors near the PICC.  Nerve or tendon irritation or injury during PICC insertion. How to take care of your PICC Preventing problems  You and any caregivers should wash your hands often with soap. Wash hands: ? Before touching the PICC line or the infusion device. ? Before changing a bandage (dressing).  Flush the PICC as told by your  health care provider. Let your health care provider know right away if the PICC is hard to flush or does not flush. Do not use force to flush the PICC.  Do not use a syringe that is less than 10 mL to flush the PICC.  Avoid blood pressure checks on the arm in which the PICC is placed.  Never pull or tug on the PICC.  Do not take the PICC out yourself. Only a trained clinical professional should remove the PICC.  Use clean and sterile supplies only. Keep the supplies in a dry place. Do not reuse needles, syringes, or any other supplies. Doing that can lead to infection.  Keep pets and children away from your PICC line.  Check the PICC insertion site every day for signs of infection. Check for: ? Leakage. ? Redness, swelling, or pain. ? Fluid or blood. ? Warmth. ? Pus or a bad smell. PICC dressing care  Keep your PICC bandage (dressing) clean and dry to prevent infection.  Do not take baths, swim, or use a hot tub until your health care provider approves. Ask your health care provider if you can take showers. You may only be allowed to take sponge baths for bathing. When you are allowed to shower: ? Ask your health care provider to teach you how to wrap the PICC line. ? Cover the PICC line with clear plastic wrap and tape to keep it dry while showering.  Follow instructions from your health care provider about   how to take care of your insertion site and dressing. Make sure you: ? Wash your hands with soap and water before you change your bandage (dressing). If soap and water are not available, use hand sanitizer. ? Change your dressing as told by your health care provider. ? Leave stitches (sutures), skin glue, or adhesive strips in place. These skin closures may need to stay in place for 2 weeks or longer. If adhesive strip edges start to loosen and curl up, you may trim the loose edges. Do not remove adhesive strips completely unless your health care provider tells you to do  that.  Change your PICC dressing if it becomes loose or wet. General instructions  Carry your PICC identification card or wear a medical alert bracelet at all times.  Keep the tube clamped at all times, unless it is being used.  Carry a smooth-edge clamp with you at all times to place on the tube if it breaks.  Do not use scissors or sharp objects near the tube.  You may bend your arm and move it freely. If your PICC is near or at the bend of your elbow, avoid activity with repeated motion at the elbow.  Avoid lifting heavy objects as told by your health care provider.  Keep all follow-up visits as told by your health care provider. This is important.   Disposal of supplies  Throw away any syringes in a disposal container that is meant for sharp items (sharps container). You can buy a sharps container from a pharmacy, or you can make one by using an empty hard plastic bottle with a cover.  Place any used dressings or infusion bags into a plastic bag. Throw that bag in the trash. Contact a health care provider if:  You have pain in your arm, ear, face, or teeth.  You have a fever or chills.  You have redness, swelling, or pain around the insertion site.  You have fluid or blood coming from the insertion site.  Your insertion site feels warm to the touch.  You have pus or a bad smell coming from the insertion site.  Your skin feels hard and raised around the insertion site. Get help right away if:  Your PICC is accidentally pulled all the way out. If this happens, cover the insertion site with a bandage or gauze dressing. Do not throw the PICC away. Your health care provider will need to check it.  Your PICC was tugged or pulled and has partially come out. Do not  push the PICC back in.  You cannot flush the PICC, it is hard to flush, or the PICC leaks around the insertion site when it is flushed.  You hear a "flushing" sound when the PICC is flushed.  You feel your  heart racing or skipping beats.  There is a hole or tear in the PICC.  You have swelling in the arm in which the PICC was inserted.  You have a red streak going up your arm from where the PICC was inserted. Summary  A peripherally inserted central catheter (PICC) is a long, thin, flexible tube (catheter) that is inserted into a vein in the upper arm.  The PICC is inserted using a sterile technique by a specially trained nurse or physician. Only a trained clinical professional should remove it.  Keep your PICC identification card with you at all times.  Avoid blood pressure checks on the arm in which the PICC is placed.  If cared for   properly, a PICC can remain in place for several months. Having a PICC can also allow a person to go home from the hospital sooner. This information is not intended to replace advice given to you by your health care provider. Make sure you discuss any questions you have with your health care provider. Document Revised: 03/02/2020 Document Reviewed: 03/02/2020 Elsevier Patient Education  2021 Elsevier Inc.  

## 2020-12-15 NOTE — Patient Instructions (Signed)
[facility] Discharge Instructions for Patients Receiving Chemotherapy  Today you received the following chemotherapy agent: Azacitidine (Vidaza)  To help prevent nausea and vomiting after your treatment, we encourage you to take your nausea medication as directed by your MD.   If you develop nausea and vomiting that is not controlled by your nausea medication, call the clinic.   BELOW ARE SYMPTOMS THAT SHOULD BE REPORTED IMMEDIATELY:  *FEVER GREATER THAN 100.5 F  *CHILLS WITH OR WITHOUT FEVER  NAUSEA AND VOMITING THAT IS NOT CONTROLLED WITH YOUR NAUSEA MEDICATION  *UNUSUAL SHORTNESS OF BREATH  *UNUSUAL BRUISING OR BLEEDING  TENDERNESS IN MOUTH AND THROAT WITH OR WITHOUT PRESENCE OF ULCERS  *URINARY PROBLEMS  *BOWEL PROBLEMS  UNUSUAL RASH Items with * indicate a potential emergency and should be followed up as soon as possible.  Feel free to call the clinic should you have any questions or concerns. The clinic phone number is [phone].  Please show the CHEMO ALERT CARD at check-in to the Emergency Department and triage nurse.   

## 2020-12-16 ENCOUNTER — Other Ambulatory Visit: Payer: Self-pay

## 2020-12-16 ENCOUNTER — Inpatient Hospital Stay: Payer: Medicare Other

## 2020-12-16 ENCOUNTER — Other Ambulatory Visit: Payer: Medicare Other

## 2020-12-16 VITALS — BP 140/63 | HR 66 | Temp 97.6°F | Resp 20

## 2020-12-16 DIAGNOSIS — C9200 Acute myeloblastic leukemia, not having achieved remission: Secondary | ICD-10-CM | POA: Diagnosis not present

## 2020-12-16 DIAGNOSIS — C9500 Acute leukemia of unspecified cell type not having achieved remission: Secondary | ICD-10-CM

## 2020-12-16 MED ORDER — SODIUM CHLORIDE 0.9 % IV SOLN
10.0000 mg | Freq: Once | INTRAVENOUS | Status: AC
  Administered 2020-12-16: 10 mg via INTRAVENOUS
  Filled 2020-12-16: qty 10

## 2020-12-16 MED ORDER — PALONOSETRON HCL INJECTION 0.25 MG/5ML
INTRAVENOUS | Status: AC
  Filled 2020-12-16: qty 5

## 2020-12-16 MED ORDER — PALONOSETRON HCL INJECTION 0.25 MG/5ML
0.2500 mg | Freq: Once | INTRAVENOUS | Status: AC
  Administered 2020-12-16: 0.25 mg via INTRAVENOUS

## 2020-12-16 MED ORDER — SODIUM CHLORIDE 0.9 % IV SOLN
Freq: Once | INTRAVENOUS | Status: AC
Start: 2020-12-16 — End: 2020-12-16
  Filled 2020-12-16: qty 250

## 2020-12-16 MED ORDER — SODIUM CHLORIDE 0.9 % IV SOLN
75.0000 mg/m2 | Freq: Once | INTRAVENOUS | Status: AC
  Administered 2020-12-16: 155 mg via INTRAVENOUS
  Filled 2020-12-16: qty 15.5

## 2020-12-16 MED ORDER — HEPARIN SOD (PORK) LOCK FLUSH 100 UNIT/ML IV SOLN
500.0000 [IU] | Freq: Once | INTRAVENOUS | Status: AC | PRN
  Administered 2020-12-16: 300 [IU]
  Filled 2020-12-16: qty 5

## 2020-12-16 MED ORDER — SODIUM CHLORIDE 0.9% FLUSH
10.0000 mL | INTRAVENOUS | Status: DC | PRN
  Administered 2020-12-16: 10 mL
  Filled 2020-12-16: qty 10

## 2020-12-16 NOTE — Patient Instructions (Signed)
[facility] Discharge Instructions for Patients Receiving Chemotherapy  Today you received the following chemotherapy agent: Azacitidine (Vidaza)  To help prevent nausea and vomiting after your treatment, we encourage you to take your nausea medication as directed by your MD.   If you develop nausea and vomiting that is not controlled by your nausea medication, call the clinic.   BELOW ARE SYMPTOMS THAT SHOULD BE REPORTED IMMEDIATELY:  *FEVER GREATER THAN 100.5 F  *CHILLS WITH OR WITHOUT FEVER  NAUSEA AND VOMITING THAT IS NOT CONTROLLED WITH YOUR NAUSEA MEDICATION  *UNUSUAL SHORTNESS OF BREATH  *UNUSUAL BRUISING OR BLEEDING  TENDERNESS IN MOUTH AND THROAT WITH OR WITHOUT PRESENCE OF ULCERS  *URINARY PROBLEMS  *BOWEL PROBLEMS  UNUSUAL RASH Items with * indicate a potential emergency and should be followed up as soon as possible.  Feel free to call the clinic should you have any questions or concerns. The clinic phone number is [phone].  Please show the CHEMO ALERT CARD at check-in to the Emergency Department and triage nurse.   

## 2020-12-19 ENCOUNTER — Inpatient Hospital Stay: Payer: Medicare Other

## 2020-12-19 ENCOUNTER — Other Ambulatory Visit: Payer: Self-pay

## 2020-12-19 VITALS — BP 124/56 | HR 69 | Temp 97.3°F | Resp 18

## 2020-12-19 DIAGNOSIS — Z452 Encounter for adjustment and management of vascular access device: Secondary | ICD-10-CM

## 2020-12-19 DIAGNOSIS — C9500 Acute leukemia of unspecified cell type not having achieved remission: Secondary | ICD-10-CM

## 2020-12-19 DIAGNOSIS — C9200 Acute myeloblastic leukemia, not having achieved remission: Secondary | ICD-10-CM | POA: Diagnosis not present

## 2020-12-19 LAB — CBC WITH DIFFERENTIAL (CANCER CENTER ONLY)
Abs Immature Granulocytes: 0.05 10*3/uL (ref 0.00–0.07)
Basophils Absolute: 0 10*3/uL (ref 0.0–0.1)
Basophils Relative: 0 %
Eosinophils Absolute: 0 10*3/uL (ref 0.0–0.5)
Eosinophils Relative: 0 %
HCT: 35.3 % — ABNORMAL LOW (ref 39.0–52.0)
Hemoglobin: 12 g/dL — ABNORMAL LOW (ref 13.0–17.0)
Immature Granulocytes: 2 %
Lymphocytes Relative: 25 %
Lymphs Abs: 0.9 10*3/uL (ref 0.7–4.0)
MCH: 34 pg (ref 26.0–34.0)
MCHC: 34 g/dL (ref 30.0–36.0)
MCV: 100 fL (ref 80.0–100.0)
Monocytes Absolute: 0.2 10*3/uL (ref 0.1–1.0)
Monocytes Relative: 6 %
Neutro Abs: 2.3 10*3/uL (ref 1.7–7.7)
Neutrophils Relative %: 67 %
Platelet Count: 30 10*3/uL — ABNORMAL LOW (ref 150–400)
RBC: 3.53 MIL/uL — ABNORMAL LOW (ref 4.22–5.81)
RDW: 15.9 % — ABNORMAL HIGH (ref 11.5–15.5)
WBC Count: 3.4 10*3/uL — ABNORMAL LOW (ref 4.0–10.5)
nRBC: 0.6 % — ABNORMAL HIGH (ref 0.0–0.2)

## 2020-12-19 LAB — CMP (CANCER CENTER ONLY)
ALT: 12 U/L (ref 0–44)
AST: 13 U/L — ABNORMAL LOW (ref 15–41)
Albumin: 3.2 g/dL — ABNORMAL LOW (ref 3.5–5.0)
Alkaline Phosphatase: 46 U/L (ref 38–126)
Anion gap: 5 (ref 5–15)
BUN: 26 mg/dL — ABNORMAL HIGH (ref 8–23)
CO2: 25 mmol/L (ref 22–32)
Calcium: 8.2 mg/dL — ABNORMAL LOW (ref 8.9–10.3)
Chloride: 109 mmol/L (ref 98–111)
Creatinine: 0.98 mg/dL (ref 0.61–1.24)
GFR, Estimated: >60 mL/min (ref >60)
Glucose, Bld: 91 mg/dL (ref 70–99)
Potassium: 3.7 mmol/L (ref 3.5–5.1)
Sodium: 139 mmol/L (ref 135–145)
Total Bilirubin: 0.5 mg/dL (ref 0.3–1.2)
Total Protein: 5 g/dL — ABNORMAL LOW (ref 6.5–8.1)

## 2020-12-19 LAB — MAGNESIUM: Magnesium: 2 mg/dL (ref 1.7–2.4)

## 2020-12-19 MED ORDER — PALONOSETRON HCL INJECTION 0.25 MG/5ML
INTRAVENOUS | Status: AC
  Filled 2020-12-19: qty 5

## 2020-12-19 MED ORDER — SODIUM CHLORIDE 0.9% FLUSH
10.0000 mL | Freq: Once | INTRAVENOUS | Status: DC
  Filled 2020-12-19: qty 10

## 2020-12-19 MED ORDER — SODIUM CHLORIDE 0.9% FLUSH
10.0000 mL | INTRAVENOUS | Status: DC | PRN
  Administered 2020-12-19: 10 mL
  Filled 2020-12-19: qty 10

## 2020-12-19 MED ORDER — SODIUM CHLORIDE 0.9 % IV SOLN
Freq: Once | INTRAVENOUS | Status: AC
Start: 2020-12-19 — End: 2020-12-19
  Filled 2020-12-19: qty 250

## 2020-12-19 MED ORDER — SODIUM CHLORIDE 0.9 % IV SOLN: 75.0000 mg/m2 | Freq: Once | INTRAVENOUS | Status: DC

## 2020-12-19 MED ORDER — SODIUM CHLORIDE 0.9 % IV SOLN
10.0000 mg | Freq: Once | INTRAVENOUS | Status: AC
  Administered 2020-12-19: 10 mg via INTRAVENOUS
  Filled 2020-12-19: qty 10

## 2020-12-19 MED ORDER — PALONOSETRON HCL INJECTION 0.25 MG/5ML
0.2500 mg | Freq: Once | INTRAVENOUS | Status: AC
  Administered 2020-12-19: 0.25 mg via INTRAVENOUS

## 2020-12-19 MED ORDER — HEPARIN SOD (PORK) LOCK FLUSH 100 UNIT/ML IV SOLN
500.0000 [IU] | Freq: Once | INTRAVENOUS | Status: AC | PRN
  Administered 2020-12-19: 300 [IU]
  Filled 2020-12-19: qty 5

## 2020-12-19 NOTE — Progress Notes (Signed)
Spoke with Dr. Benay Spice. Pt PLT 30, States to hold treatment today and tomorrow. Notified of premed administration. States no action at this time

## 2020-12-19 NOTE — Patient Instructions (Addendum)
PICC Home Care Guide A peripherally inserted central catheter (PICC) is a form of IV access that allows medicines and IV fluids to be quickly distributed throughout the body. The PICC is a long, thin, flexible tube (catheter) that is inserted into a vein in the upper arm. The catheter ends in a large vein in the chest (superior vena cava, or SVC). After the PICC is inserted, a chest X-ray may be done to make sure that it is in the correct place. A PICC may be placed for different reasons, such as:  To give medicines and liquid nutrition.  To give IV fluids and blood products.  If there is trouble placing a peripheral intravenous (PIV) catheter. If taken care of properly, a PICC can remain in place for several months. Having a PICC can also allow a person to go home from the hospital sooner. Medicine and PICC care can be managed at home by a family member, caregiver, or home health care team. What are the risks? Generally, having a PICC is safe. However, problems may occur, including:  A blood clot (thrombus) forming in or at the tip of the PICC.  A blood clot forming in a vein (deep vein thrombosis) or traveling to the lung (pulmonary embolism).  Inflammation of the vein (phlebitis) in which the PICC is placed.  Infection. Central line associated blood stream infection (CLABSI) is a serious infection that often requires hospitalization.  PICC movement (malposition). The PICC tip may move from its original position due to excessive physical activity, forceful coughing, sneezing, or vomiting.  A break or cut in the PICC. It is important not to use scissors near the PICC.  Nerve or tendon irritation or injury during PICC insertion. How to take care of your PICC Preventing problems  You and any caregivers should wash your hands often with soap. Wash hands: ? Before touching the PICC line or the infusion device. ? Before changing a bandage (dressing).  Flush the PICC as told by your  health care provider. Let your health care provider know right away if the PICC is hard to flush or does not flush. Do not use force to flush the PICC.  Do not use a syringe that is less than 10 mL to flush the PICC.  Avoid blood pressure checks on the arm in which the PICC is placed.  Never pull or tug on the PICC.  Do not take the PICC out yourself. Only a trained clinical professional should remove the PICC.  Use clean and sterile supplies only. Keep the supplies in a dry place. Do not reuse needles, syringes, or any other supplies. Doing that can lead to infection.  Keep pets and children away from your PICC line.  Check the PICC insertion site every day for signs of infection. Check for: ? Leakage. ? Redness, swelling, or pain. ? Fluid or blood. ? Warmth. ? Pus or a bad smell. PICC dressing care  Keep your PICC bandage (dressing) clean and dry to prevent infection.  Do not take baths, swim, or use a hot tub until your health care provider approves. Ask your health care provider if you can take showers. You may only be allowed to take sponge baths for bathing. When you are allowed to shower: ? Ask your health care provider to teach you how to wrap the PICC line. ? Cover the PICC line with clear plastic wrap and tape to keep it dry while showering.  Follow instructions from your health care provider about   how to take care of your insertion site and dressing. Make sure you: ? Wash your hands with soap and water before you change your bandage (dressing). If soap and water are not available, use hand sanitizer. ? Change your dressing as told by your health care provider. ? Leave stitches (sutures), skin glue, or adhesive strips in place. These skin closures may need to stay in place for 2 weeks or longer. If adhesive strip edges start to loosen and curl up, you may trim the loose edges. Do not remove adhesive strips completely unless your health care provider tells you to do  that.  Change your PICC dressing if it becomes loose or wet. General instructions  Carry your PICC identification card or wear a medical alert bracelet at all times.  Keep the tube clamped at all times, unless it is being used.  Carry a smooth-edge clamp with you at all times to place on the tube if it breaks.  Do not use scissors or sharp objects near the tube.  You may bend your arm and move it freely. If your PICC is near or at the bend of your elbow, avoid activity with repeated motion at the elbow.  Avoid lifting heavy objects as told by your health care provider.  Keep all follow-up visits as told by your health care provider. This is important.   Disposal of supplies  Throw away any syringes in a disposal container that is meant for sharp items (sharps container). You can buy a sharps container from a pharmacy, or you can make one by using an empty hard plastic bottle with a cover.  Place any used dressings or infusion bags into a plastic bag. Throw that bag in the trash. Contact a health care provider if:  You have pain in your arm, ear, face, or teeth.  You have a fever or chills.  You have redness, swelling, or pain around the insertion site.  You have fluid or blood coming from the insertion site.  Your insertion site feels warm to the touch.  You have pus or a bad smell coming from the insertion site.  Your skin feels hard and raised around the insertion site. Get help right away if:  Your PICC is accidentally pulled all the way out. If this happens, cover the insertion site with a bandage or gauze dressing. Do not throw the PICC away. Your health care provider will need to check it.  Your PICC was tugged or pulled and has partially come out. Do not  push the PICC back in.  You cannot flush the PICC, it is hard to flush, or the PICC leaks around the insertion site when it is flushed.  You hear a "flushing" sound when the PICC is flushed.  You feel your  heart racing or skipping beats.  There is a hole or tear in the PICC.  You have swelling in the arm in which the PICC was inserted.  You have a red streak going up your arm from where the PICC was inserted. Summary  A peripherally inserted central catheter (PICC) is a long, thin, flexible tube (catheter) that is inserted into a vein in the upper arm.  The PICC is inserted using a sterile technique by a specially trained nurse or physician. Only a trained clinical professional should remove it.  Keep your PICC identification card with you at all times.  Avoid blood pressure checks on the arm in which the PICC is placed.  If cared for   properly, a PICC can remain in place for several months. Having a PICC can also allow a person to go home from the hospital sooner. This information is not intended to replace advice given to you by your health care provider. Make sure you discuss any questions you have with your health care provider. Document Revised: 03/02/2020 Document Reviewed: 03/02/2020 Elsevier Patient Education  2021 Elsevier Inc.  

## 2020-12-19 NOTE — Patient Instructions (Signed)
Dr. Benay Spice Recommends to hold treatment for today due to low Platelets.

## 2020-12-20 ENCOUNTER — Other Ambulatory Visit: Payer: Medicare Other

## 2020-12-20 ENCOUNTER — Inpatient Hospital Stay: Payer: Medicare Other

## 2020-12-22 ENCOUNTER — Telehealth: Payer: Self-pay | Admitting: *Deleted

## 2020-12-22 ENCOUNTER — Other Ambulatory Visit: Payer: Self-pay

## 2020-12-22 ENCOUNTER — Inpatient Hospital Stay: Payer: Medicare Other

## 2020-12-22 ENCOUNTER — Other Ambulatory Visit: Payer: Self-pay | Admitting: *Deleted

## 2020-12-22 DIAGNOSIS — C9500 Acute leukemia of unspecified cell type not having achieved remission: Secondary | ICD-10-CM

## 2020-12-22 DIAGNOSIS — C9200 Acute myeloblastic leukemia, not having achieved remission: Secondary | ICD-10-CM | POA: Diagnosis not present

## 2020-12-22 DIAGNOSIS — D696 Thrombocytopenia, unspecified: Secondary | ICD-10-CM

## 2020-12-22 LAB — CBC WITH DIFFERENTIAL (CANCER CENTER ONLY)
Abs Immature Granulocytes: 0.05 10*3/uL (ref 0.00–0.07)
Basophils Absolute: 0 10*3/uL (ref 0.0–0.1)
Basophils Relative: 0 %
Eosinophils Absolute: 0 10*3/uL (ref 0.0–0.5)
Eosinophils Relative: 0 %
HCT: 34.7 % — ABNORMAL LOW (ref 39.0–52.0)
Hemoglobin: 11.7 g/dL — ABNORMAL LOW (ref 13.0–17.0)
Immature Granulocytes: 2 %
Lymphocytes Relative: 37 %
Lymphs Abs: 0.9 10*3/uL (ref 0.7–4.0)
MCH: 34.1 pg — ABNORMAL HIGH (ref 26.0–34.0)
MCHC: 33.7 g/dL (ref 30.0–36.0)
MCV: 101.2 fL — ABNORMAL HIGH (ref 80.0–100.0)
Monocytes Absolute: 0.2 10*3/uL (ref 0.1–1.0)
Monocytes Relative: 6 %
Neutro Abs: 1.4 10*3/uL — ABNORMAL LOW (ref 1.7–7.7)
Neutrophils Relative %: 55 %
Platelet Count: 20 10*3/uL — ABNORMAL LOW (ref 150–400)
RBC: 3.43 MIL/uL — ABNORMAL LOW (ref 4.22–5.81)
RDW: 16 % — ABNORMAL HIGH (ref 11.5–15.5)
WBC Count: 2.5 10*3/uL — ABNORMAL LOW (ref 4.0–10.5)
nRBC: 0 % (ref 0.0–0.2)

## 2020-12-22 MED ORDER — HEPARIN SOD (PORK) LOCK FLUSH 100 UNIT/ML IV SOLN
500.0000 [IU] | INTRAVENOUS | Status: AC | PRN
  Administered 2020-12-22: 500 [IU]
  Filled 2020-12-22: qty 5

## 2020-12-22 MED ORDER — SODIUM CHLORIDE 0.9% FLUSH
10.0000 mL | INTRAVENOUS | Status: AC | PRN
  Administered 2020-12-22: 10 mL
  Filled 2020-12-22: qty 10

## 2020-12-22 NOTE — Telephone Encounter (Signed)
Return call from [facility]: Per Sarah, NP they agree with platelets tomorrow and he does not need to start antibiotics yet.

## 2020-12-22 NOTE — Progress Notes (Signed)
Called blood bank and confirmed they see platelet order and will have unit available tomorrow for transfusion.

## 2020-12-22 NOTE — Patient Instructions (Signed)
PICC Home Care Guide A peripherally inserted central catheter (PICC) is a form of IV access that allows medicines and IV fluids to be quickly distributed throughout the body. The PICC is a long, thin, flexible tube (catheter) that is inserted into a vein in the upper arm. The catheter ends in a large vein in the chest (superior vena cava, or SVC). After the PICC is inserted, a chest X-ray may be done to make sure that it is in the correct place. A PICC may be placed for different reasons, such as:  To give medicines and liquid nutrition.  To give IV fluids and blood products.  If there is trouble placing a peripheral intravenous (PIV) catheter. If taken care of properly, a PICC can remain in place for several months. Having a PICC can also allow a person to go home from the hospital sooner. Medicine and PICC care can be managed at home by a family member, caregiver, or home health care team. What are the risks? Generally, having a PICC is safe. However, problems may occur, including:  A blood clot (thrombus) forming in or at the tip of the PICC.  A blood clot forming in a vein (deep vein thrombosis) or traveling to the lung (pulmonary embolism).  Inflammation of the vein (phlebitis) in which the PICC is placed.  Infection. Central line associated blood stream infection (CLABSI) is a serious infection that often requires hospitalization.  PICC movement (malposition). The PICC tip may move from its original position due to excessive physical activity, forceful coughing, sneezing, or vomiting.  A break or cut in the PICC. It is important not to use scissors near the PICC.  Nerve or tendon irritation or injury during PICC insertion. How to take care of your PICC Preventing problems  You and any caregivers should wash your hands often with soap. Wash hands: ? Before touching the PICC line or the infusion device. ? Before changing a bandage (dressing).  Flush the PICC as told by your  health care provider. Let your health care provider know right away if the PICC is hard to flush or does not flush. Do not use force to flush the PICC.  Do not use a syringe that is less than 10 mL to flush the PICC.  Avoid blood pressure checks on the arm in which the PICC is placed.  Never pull or tug on the PICC.  Do not take the PICC out yourself. Only a trained clinical professional should remove the PICC.  Use clean and sterile supplies only. Keep the supplies in a dry place. Do not reuse needles, syringes, or any other supplies. Doing that can lead to infection.  Keep pets and children away from your PICC line.  Check the PICC insertion site every day for signs of infection. Check for: ? Leakage. ? Redness, swelling, or pain. ? Fluid or blood. ? Warmth. ? Pus or a bad smell. PICC dressing care  Keep your PICC bandage (dressing) clean and dry to prevent infection.  Do not take baths, swim, or use a hot tub until your health care provider approves. Ask your health care provider if you can take showers. You may only be allowed to take sponge baths for bathing. When you are allowed to shower: ? Ask your health care provider to teach you how to wrap the PICC line. ? Cover the PICC line with clear plastic wrap and tape to keep it dry while showering.  Follow instructions from your health care provider about   how to take care of your insertion site and dressing. Make sure you: ? Wash your hands with soap and water before you change your bandage (dressing). If soap and water are not available, use hand sanitizer. ? Change your dressing as told by your health care provider. ? Leave stitches (sutures), skin glue, or adhesive strips in place. These skin closures may need to stay in place for 2 weeks or longer. If adhesive strip edges start to loosen and curl up, you may trim the loose edges. Do not remove adhesive strips completely unless your health care provider tells you to do  that.  Change your PICC dressing if it becomes loose or wet. General instructions  Carry your PICC identification card or wear a medical alert bracelet at all times.  Keep the tube clamped at all times, unless it is being used.  Carry a smooth-edge clamp with you at all times to place on the tube if it breaks.  Do not use scissors or sharp objects near the tube.  You may bend your arm and move it freely. If your PICC is near or at the bend of your elbow, avoid activity with repeated motion at the elbow.  Avoid lifting heavy objects as told by your health care provider.  Keep all follow-up visits as told by your health care provider. This is important.   Disposal of supplies  Throw away any syringes in a disposal container that is meant for sharp items (sharps container). You can buy a sharps container from a pharmacy, or you can make one by using an empty hard plastic bottle with a cover.  Place any used dressings or infusion bags into a plastic bag. Throw that bag in the trash. Contact a health care provider if:  You have pain in your arm, ear, face, or teeth.  You have a fever or chills.  You have redness, swelling, or pain around the insertion site.  You have fluid or blood coming from the insertion site.  Your insertion site feels warm to the touch.  You have pus or a bad smell coming from the insertion site.  Your skin feels hard and raised around the insertion site. Get help right away if:  Your PICC is accidentally pulled all the way out. If this happens, cover the insertion site with a bandage or gauze dressing. Do not throw the PICC away. Your health care provider will need to check it.  Your PICC was tugged or pulled and has partially come out. Do not  push the PICC back in.  You cannot flush the PICC, it is hard to flush, or the PICC leaks around the insertion site when it is flushed.  You hear a "flushing" sound when the PICC is flushed.  You feel your  heart racing or skipping beats.  There is a hole or tear in the PICC.  You have swelling in the arm in which the PICC was inserted.  You have a red streak going up your arm from where the PICC was inserted. Summary  A peripherally inserted central catheter (PICC) is a long, thin, flexible tube (catheter) that is inserted into a vein in the upper arm.  The PICC is inserted using a sterile technique by a specially trained nurse or physician. Only a trained clinical professional should remove it.  Keep your PICC identification card with you at all times.  Avoid blood pressure checks on the arm in which the PICC is placed.  If cared for   properly, a PICC can remain in place for several months. Having a PICC can also allow a person to go home from the hospital sooner. This information is not intended to replace advice given to you by your health care provider. Make sure you discuss any questions you have with your health care provider. Document Revised: 03/02/2020 Document Reviewed: 03/02/2020 Elsevier Patient Education  2021 Elsevier Inc.  

## 2020-12-22 NOTE — Telephone Encounter (Signed)
Informed patient of CBC results today and need to return tomorrow for repeat labs and possible platelet transfusion. He will need to wait for results and nurse will come out and speak w/him.  Criticals called to Alonda at [facility] and requested they call patient with any medication changes. She will forward message to provider. Faxed to [phone] as well.

## 2020-12-23 ENCOUNTER — Inpatient Hospital Stay: Payer: Medicare Other

## 2020-12-23 ENCOUNTER — Telehealth: Payer: Self-pay | Admitting: *Deleted

## 2020-12-23 ENCOUNTER — Other Ambulatory Visit: Payer: Self-pay

## 2020-12-23 ENCOUNTER — Encounter (INDEPENDENT_AMBULATORY_CARE_PROVIDER_SITE_OTHER): Payer: Medicare Other | Admitting: Ophthalmology

## 2020-12-23 VITALS — BP 122/55 | HR 78 | Temp 97.9°F | Resp 17

## 2020-12-23 DIAGNOSIS — H35371 Puckering of macula, right eye: Secondary | ICD-10-CM

## 2020-12-23 DIAGNOSIS — C9500 Acute leukemia of unspecified cell type not having achieved remission: Secondary | ICD-10-CM

## 2020-12-23 DIAGNOSIS — D696 Thrombocytopenia, unspecified: Secondary | ICD-10-CM

## 2020-12-23 DIAGNOSIS — H353132 Nonexudative age-related macular degeneration, bilateral, intermediate dry stage: Secondary | ICD-10-CM | POA: Diagnosis not present

## 2020-12-23 DIAGNOSIS — H43811 Vitreous degeneration, right eye: Secondary | ICD-10-CM | POA: Diagnosis not present

## 2020-12-23 DIAGNOSIS — Z452 Encounter for adjustment and management of vascular access device: Secondary | ICD-10-CM

## 2020-12-23 DIAGNOSIS — C9200 Acute myeloblastic leukemia, not having achieved remission: Secondary | ICD-10-CM | POA: Diagnosis not present

## 2020-12-23 LAB — CBC WITH DIFFERENTIAL (CANCER CENTER ONLY)
Abs Immature Granulocytes: 0.01 10*3/uL (ref 0.00–0.07)
Basophils Absolute: 0 10*3/uL (ref 0.0–0.1)
Basophils Relative: 0 %
Eosinophils Absolute: 0 10*3/uL (ref 0.0–0.5)
Eosinophils Relative: 0 %
HCT: 33.9 % — ABNORMAL LOW (ref 39.0–52.0)
Hemoglobin: 11.5 g/dL — ABNORMAL LOW (ref 13.0–17.0)
Immature Granulocytes: 1 %
Lymphocytes Relative: 44 %
Lymphs Abs: 0.8 10*3/uL (ref 0.7–4.0)
MCH: 34.4 pg — ABNORMAL HIGH (ref 26.0–34.0)
MCHC: 33.9 g/dL (ref 30.0–36.0)
MCV: 101.5 fL — ABNORMAL HIGH (ref 80.0–100.0)
Monocytes Absolute: 0.1 10*3/uL (ref 0.1–1.0)
Monocytes Relative: 6 %
Neutro Abs: 0.9 10*3/uL — ABNORMAL LOW (ref 1.7–7.7)
Neutrophils Relative %: 49 %
Platelet Count: 16 10*3/uL — ABNORMAL LOW (ref 150–400)
RBC: 3.34 MIL/uL — ABNORMAL LOW (ref 4.22–5.81)
RDW: 15.9 % — ABNORMAL HIGH (ref 11.5–15.5)
WBC Count: 1.7 10*3/uL — ABNORMAL LOW (ref 4.0–10.5)
nRBC: 0 % (ref 0.0–0.2)

## 2020-12-23 MED ORDER — SODIUM CHLORIDE 0.9% FLUSH
10.0000 mL | Freq: Once | INTRAVENOUS | Status: AC
  Administered 2020-12-23: 10 mL
  Filled 2020-12-23: qty 10

## 2020-12-23 MED ORDER — SODIUM CHLORIDE 0.9% FLUSH
10.0000 mL | INTRAVENOUS | Status: AC | PRN
  Administered 2020-12-23: 10 mL
  Filled 2020-12-23: qty 10

## 2020-12-23 MED ORDER — HEPARIN SOD (PORK) LOCK FLUSH 100 UNIT/ML IV SOLN
250.0000 [IU] | Freq: Once | INTRAVENOUS | Status: AC
  Administered 2020-12-23: 250 [IU]
  Filled 2020-12-23: qty 5

## 2020-12-23 MED ORDER — SODIUM CHLORIDE 0.9% FLUSH
10.0000 mL | Freq: Once | INTRAVENOUS | Status: DC
  Filled 2020-12-23: qty 10

## 2020-12-23 MED ORDER — SODIUM CHLORIDE 0.9% IV SOLUTION
250.0000 mL | Freq: Once | INTRAVENOUS | Status: DC
  Filled 2020-12-23: qty 250

## 2020-12-23 NOTE — Patient Instructions (Signed)
PICC Home Care Guide A peripherally inserted central catheter (PICC) is a form of IV access that allows medicines and IV fluids to be quickly distributed throughout the body. The PICC is a long, thin, flexible tube (catheter) that is inserted into a vein in the upper arm. The catheter ends in a large vein in the chest (superior vena cava, or SVC). After the PICC is inserted, a chest X-ray may be done to make sure that it is in the correct place. A PICC may be placed for different reasons, such as:  To give medicines and liquid nutrition.  To give IV fluids and blood products.  If there is trouble placing a peripheral intravenous (PIV) catheter. If taken care of properly, a PICC can remain in place for several months. Having a PICC can also allow a person to go home from the hospital sooner. Medicine and PICC care can be managed at home by a family member, caregiver, or home health care team. What are the risks? Generally, having a PICC is safe. However, problems may occur, including:  A blood clot (thrombus) forming in or at the tip of the PICC.  A blood clot forming in a vein (deep vein thrombosis) or traveling to the lung (pulmonary embolism).  Inflammation of the vein (phlebitis) in which the PICC is placed.  Infection. Central line associated blood stream infection (CLABSI) is a serious infection that often requires hospitalization.  PICC movement (malposition). The PICC tip may move from its original position due to excessive physical activity, forceful coughing, sneezing, or vomiting.  A break or cut in the PICC. It is important not to use scissors near the PICC.  Nerve or tendon irritation or injury during PICC insertion. How to take care of your PICC Preventing problems  You and any caregivers should wash your hands often with soap. Wash hands: ? Before touching the PICC line or the infusion device. ? Before changing a bandage (dressing).  Flush the PICC as told by your  health care provider. Let your health care provider know right away if the PICC is hard to flush or does not flush. Do not use force to flush the PICC.  Do not use a syringe that is less than 10 mL to flush the PICC.  Avoid blood pressure checks on the arm in which the PICC is placed.  Never pull or tug on the PICC.  Do not take the PICC out yourself. Only a trained clinical professional should remove the PICC.  Use clean and sterile supplies only. Keep the supplies in a dry place. Do not reuse needles, syringes, or any other supplies. Doing that can lead to infection.  Keep pets and children away from your PICC line.  Check the PICC insertion site every day for signs of infection. Check for: ? Leakage. ? Redness, swelling, or pain. ? Fluid or blood. ? Warmth. ? Pus or a bad smell. PICC dressing care  Keep your PICC bandage (dressing) clean and dry to prevent infection.  Do not take baths, swim, or use a hot tub until your health care provider approves. Ask your health care provider if you can take showers. You may only be allowed to take sponge baths for bathing. When you are allowed to shower: ? Ask your health care provider to teach you how to wrap the PICC line. ? Cover the PICC line with clear plastic wrap and tape to keep it dry while showering.  Follow instructions from your health care provider about   how to take care of your insertion site and dressing. Make sure you: ? Wash your hands with soap and water before you change your bandage (dressing). If soap and water are not available, use hand sanitizer. ? Change your dressing as told by your health care provider. ? Leave stitches (sutures), skin glue, or adhesive strips in place. These skin closures may need to stay in place for 2 weeks or longer. If adhesive strip edges start to loosen and curl up, you may trim the loose edges. Do not remove adhesive strips completely unless your health care provider tells you to do  that.  Change your PICC dressing if it becomes loose or wet. General instructions  Carry your PICC identification card or wear a medical alert bracelet at all times.  Keep the tube clamped at all times, unless it is being used.  Carry a smooth-edge clamp with you at all times to place on the tube if it breaks.  Do not use scissors or sharp objects near the tube.  You may bend your arm and move it freely. If your PICC is near or at the bend of your elbow, avoid activity with repeated motion at the elbow.  Avoid lifting heavy objects as told by your health care provider.  Keep all follow-up visits as told by your health care provider. This is important.   Disposal of supplies  Throw away any syringes in a disposal container that is meant for sharp items (sharps container). You can buy a sharps container from a pharmacy, or you can make one by using an empty hard plastic bottle with a cover.  Place any used dressings or infusion bags into a plastic bag. Throw that bag in the trash. Contact a health care provider if:  You have pain in your arm, ear, face, or teeth.  You have a fever or chills.  You have redness, swelling, or pain around the insertion site.  You have fluid or blood coming from the insertion site.  Your insertion site feels warm to the touch.  You have pus or a bad smell coming from the insertion site.  Your skin feels hard and raised around the insertion site. Get help right away if:  Your PICC is accidentally pulled all the way out. If this happens, cover the insertion site with a bandage or gauze dressing. Do not throw the PICC away. Your health care provider will need to check it.  Your PICC was tugged or pulled and has partially come out. Do not  push the PICC back in.  You cannot flush the PICC, it is hard to flush, or the PICC leaks around the insertion site when it is flushed.  You hear a "flushing" sound when the PICC is flushed.  You feel your  heart racing or skipping beats.  There is a hole or tear in the PICC.  You have swelling in the arm in which the PICC was inserted.  You have a red streak going up your arm from where the PICC was inserted. Summary  A peripherally inserted central catheter (PICC) is a long, thin, flexible tube (catheter) that is inserted into a vein in the upper arm.  The PICC is inserted using a sterile technique by a specially trained nurse or physician. Only a trained clinical professional should remove it.  Keep your PICC identification card with you at all times.  Avoid blood pressure checks on the arm in which the PICC is placed.  If cared for   properly, a PICC can remain in place for several months. Having a PICC can also allow a person to go home from the hospital sooner. This information is not intended to replace advice given to you by your health care provider. Make sure you discuss any questions you have with your health care provider. Document Revised: 03/02/2020 Document Reviewed: 03/02/2020 Elsevier Patient Education  2021 Elsevier Inc.  

## 2020-12-23 NOTE — Patient Instructions (Signed)

## 2020-12-23 NOTE — Telephone Encounter (Signed)
Called nurse, Hurley Cisco at [facility] with lab results today and that 1 unit platelets are being given. With Cleona at 0.9, NP Judson Roch) said to resume the fluconazole and levaquin and decrease the venetoclax to 200 mg/day. Patient and wife were notified. Recheck labs on 12/26/20. Results also faxed to [facility].

## 2020-12-24 LAB — PREPARE PLATELET PHERESIS: Unit division: 0

## 2020-12-24 LAB — BPAM PLATELET PHERESIS
Blood Product Expiration Date: 202202192359
ISSUE DATE / TIME: 202202181445
Unit Type and Rh: 5100

## 2020-12-26 ENCOUNTER — Telehealth: Payer: Self-pay | Admitting: Nurse Practitioner

## 2020-12-26 ENCOUNTER — Telehealth: Payer: Self-pay | Admitting: *Deleted

## 2020-12-26 ENCOUNTER — Inpatient Hospital Stay: Payer: Medicare Other

## 2020-12-26 ENCOUNTER — Other Ambulatory Visit: Payer: Self-pay

## 2020-12-26 ENCOUNTER — Inpatient Hospital Stay (HOSPITAL_BASED_OUTPATIENT_CLINIC_OR_DEPARTMENT_OTHER): Payer: Medicare Other | Admitting: Nurse Practitioner

## 2020-12-26 ENCOUNTER — Encounter: Payer: Self-pay | Admitting: Nurse Practitioner

## 2020-12-26 VITALS — BP 134/75 | HR 90 | Temp 96.5°F | Resp 17 | Ht 70.0 in | Wt 198.4 lb

## 2020-12-26 DIAGNOSIS — C9500 Acute leukemia of unspecified cell type not having achieved remission: Secondary | ICD-10-CM

## 2020-12-26 DIAGNOSIS — C9200 Acute myeloblastic leukemia, not having achieved remission: Secondary | ICD-10-CM | POA: Diagnosis not present

## 2020-12-26 DIAGNOSIS — Z452 Encounter for adjustment and management of vascular access device: Secondary | ICD-10-CM

## 2020-12-26 LAB — MAGNESIUM: Magnesium: 1.9 mg/dL (ref 1.7–2.4)

## 2020-12-26 LAB — CMP (CANCER CENTER ONLY)
ALT: 12 U/L (ref 0–44)
AST: 18 U/L (ref 15–41)
Albumin: 3.5 g/dL (ref 3.5–5.0)
Alkaline Phosphatase: 42 U/L (ref 38–126)
Anion gap: 8 (ref 5–15)
BUN: 17 mg/dL (ref 8–23)
CO2: 25 mmol/L (ref 22–32)
Calcium: 8.5 mg/dL — ABNORMAL LOW (ref 8.9–10.3)
Chloride: 109 mmol/L (ref 98–111)
Creatinine: 1.01 mg/dL (ref 0.61–1.24)
GFR, Estimated: >60 mL/min (ref >60)
Glucose, Bld: 95 mg/dL (ref 70–99)
Potassium: 4 mmol/L (ref 3.5–5.1)
Sodium: 142 mmol/L (ref 135–145)
Total Bilirubin: 0.7 mg/dL (ref 0.3–1.2)
Total Protein: 5.6 g/dL — ABNORMAL LOW (ref 6.5–8.1)

## 2020-12-26 LAB — CBC WITH DIFFERENTIAL (CANCER CENTER ONLY)
Abs Immature Granulocytes: 0 10*3/uL (ref 0.00–0.07)
Basophils Absolute: 0 10*3/uL (ref 0.0–0.1)
Basophils Relative: 0 %
Eosinophils Absolute: 0 10*3/uL (ref 0.0–0.5)
Eosinophils Relative: 0 %
HCT: 35.8 % — ABNORMAL LOW (ref 39.0–52.0)
Hemoglobin: 11.8 g/dL — ABNORMAL LOW (ref 13.0–17.0)
Immature Granulocytes: 0 %
Lymphocytes Relative: 69 %
Lymphs Abs: 0.8 10*3/uL (ref 0.7–4.0)
MCH: 33.7 pg (ref 26.0–34.0)
MCHC: 33 g/dL (ref 30.0–36.0)
MCV: 102.3 fL — ABNORMAL HIGH (ref 80.0–100.0)
Monocytes Absolute: 0.1 10*3/uL (ref 0.1–1.0)
Monocytes Relative: 5 %
Neutro Abs: 0.3 10*3/uL — CL (ref 1.7–7.7)
Neutrophils Relative %: 26 %
Platelet Count: 22 10*3/uL — ABNORMAL LOW (ref 150–400)
RBC: 3.5 MIL/uL — ABNORMAL LOW (ref 4.22–5.81)
RDW: 16.1 % — ABNORMAL HIGH (ref 11.5–15.5)
WBC Count: 1.1 10*3/uL — ABNORMAL LOW (ref 4.0–10.5)
nRBC: 0 % (ref 0.0–0.2)

## 2020-12-26 MED ORDER — SODIUM CHLORIDE 0.9% FLUSH
10.0000 mL | Freq: Once | INTRAVENOUS | Status: AC
  Administered 2020-12-26: 10 mL
  Filled 2020-12-26: qty 10

## 2020-12-26 MED ORDER — HEPARIN SOD (PORK) LOCK FLUSH 100 UNIT/ML IV SOLN
250.0000 [IU] | Freq: Once | INTRAVENOUS | Status: AC
  Administered 2020-12-26: 250 [IU]
  Filled 2020-12-26: qty 5

## 2020-12-26 NOTE — Telephone Encounter (Signed)
Scheduled appointments per 2/21 los. Spoke to patient who is aware of appointments dates and times. Gave patient calendar print out.  

## 2020-12-26 NOTE — Progress Notes (Addendum)
[name] OFFICE PROGRESS NOTE   Diagnosis: AML  INTERVAL HISTORY:   Victor [name] returns as scheduled.  He began cycle 4 azacitidine and venetoclax 12/12/2020.  Treatment was held 12/19/2020 and 12/20/2020 due to progressive thrombocytopenia.  He received a unit of platelets 12/23/2020 for a platelet count of 16,000.  He denies bleeding.  No fever.  No cough or shortness of breath.  He reports numbness in his hands and lower legs since he PICC line was placed in September of last year.  He thinks the numbness is most noticeable at the third, fourth and fifth fingers on his right hand.  No neck or back pain.  Objective:  Vital signs in last 24 hours:  Blood pressure 134/75, pulse 90, temperature (!) 96.5 F (35.8 C), temperature source Tympanic, resp. rate 17, height 5' 10"  (1.778 m), weight 198 lb 6.4 oz (90 kg), SpO2 99 %.    HEENT: No thrush or ulcers. Resp: Lungs clear bilaterally. Cardio: Regular rate and rhythm. GI: No hepatosplenomegaly. Vascular: Trace edema at the lower leg bilaterally. Neuro: Alert and oriented.  Upper extremity motor strength 5/5. Skin: Ecchymoses scattered over the forearms. Right upper extremity PICC site without erythema.   Lab Results:  Lab Results  Component Value Date   WBC 1.1 (L) 12/26/2020   HGB 11.8 (L) 12/26/2020   HCT 35.8 (L) 12/26/2020   MCV 102.3 (H) 12/26/2020   PLT 22 (L) 12/26/2020   NEUTROABS 0.3 (LL) 12/26/2020    Imaging:  No results found.  Medications: I have reviewed the patient's current medications.  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, FLT3-ITD mutation positive  Cycle 1 azacytidine and venetoclax August 09, 2020;venetoclax dose  reduced to 200 mg beginning 08/22/2020  Bone marrow biopsy 09/01/2020-no increase in blast cells  Cycle 2 azacitidine and venetoclax September 19, 2020, venetoclax 400 mg daily 21 days, change to 200 mg daily when he began Diflucan/Levaquin prophylaxis  Cycle 3 azacytidine and venetoclax 10/31/2020, venetoclax 400 mg daily for 14 days  Cycle 4 azacitidine and venetoclax 12/12/2020, venetoclax 400 mg daily for 14 days (treatment held 12/19/2020 and 12/20/2020 due to progressive thrombocytopenia)  2.Pancytopenia secondary to #1 and chemotherapy 3.Right PICC placed August 08, 2020 4.TTE August 08, 2020-EF 60-65% 5.Fever during hospital admission October 2021, suspicion of pneumonia, treated with cefepime and discharged on Levaquin 6.Hypervolemia with respiratory failure during hospital admission October 2021-diuresis, home Lasix 7.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  He has completed 4 cycles of 5-azacytidine and venetoclax.  The last 2 days of cycle 4 were held due to progressive thrombocytopenia.  He required a platelet transfusion last week.  Review of the CBC from today shows the platelet count is 22,000, ANC 0.3.  He understands to contact the office with fever, chills, other signs of infection, bleeding.  Next CBC on 12/29/2020 with transfusion support as needed.  The plan is for removal of the PICC line and placement of a Port-A-Cath once his blood counts have recovered.  He has not decided where he wants to have the Port-A-Cath placement done, here or at [facility].  He will let us know once he decides.  We will see him in follow-up on  01/05/2021.  We are available to see him sooner if needed.  Patient seen with Dr. Benay Spice.      [name] ANP/GNP-BC   12/26/2020  11:28 AM This was a shared visit with [name].  [name] is now at day 15 following the most recent cycle of chemotherapy.  He has severe thrombocytopenia and neutropenia.  He will continue antibiotic prophylaxis.  We will provide transfusion support as needed.  We discussed future treatment plans including Port-A-Cath placement with him.  He declined a neurology referral to evaluate the peripheral numbness.  I doubt the numbness is related to the current treatment.  I was present for greater than 50% of today's visit.  I performed medical decision making.  [name], MD

## 2020-12-26 NOTE — Patient Instructions (Addendum)
\[id number]\[facility] Home Care Guide A peripherally inserted central catheter (PICC) is a form of IV access that allows medicines and IV fluids to be quickly distributed throughout the body. The PICC is a long, thin, flexible tube (catheter) that is inserted into a vein in the upper arm. The catheter ends in a large vein in the chest (superior vena cava, or SVC). After the PICC is inserted, a chest X-ray may be done to make sure that it is in the correct place. A PICC may be placed for different reasons, such as:  To give medicines and liquid nutrition.  To give IV fluids and blood products.  If there is trouble placing a peripheral intravenous (PIV) catheter. If taken care of properly, a PICC can remain in place for several months. Having a PICC can also allow a person to go home from the hospital sooner. Medicine and PICC care can be managed at home by a family member, caregiver, or home health care team. What are the risks? Generally, having a PICC is safe. However, problems may occur, including:  A blood clot (thrombus) forming in or at the tip of the PICC.  A blood clot forming in a vein (deep vein thrombosis) or traveling to the lung (pulmonary embolism).  Inflammation of the vein (phlebitis) in which the PICC is placed.  Infection. Central line associated blood stream infection (CLABSI) is a serious infection that often requires hospitalization.  PICC movement (malposition). The PICC tip may move from its original position due to excessive physical activity, forceful coughing, sneezing, or vomiting.  A break or cut in the PICC. It is important not to use scissors near the PICC.  Nerve or tendon irritation or injury during PICC insertion. How to take care of your PICC Preventing problems  You and any caregivers should wash your hands often with soap. Wash hands: ? Before touching the PICC line or the infusion device. ? Before changing a bandage (dressing).  Flush the PICC as told  by your health care provider. Let your health care provider know right away if the PICC is hard to flush or does not flush. Do not use force to flush the PICC.  Do not use a syringe that is less than 10 mL to flush the PICC.  Avoid blood pressure checks on the arm in which the PICC is placed.  Never pull or tug on the PICC.  Do not take the PICC out yourself. Only a trained clinical professional should remove the PICC.  Use clean and sterile supplies only. Keep the supplies in a dry place. Do not reuse needles, syringes, or any other supplies. Doing that can lead to infection.  Keep pets and children away from your PICC line.  Check the PICC insertion site every day for signs of infection. Check for: ? Leakage. ? Redness, swelling, or pain. ? Fluid or blood. ? Warmth. ? Pus or a bad smell. PICC dressing care  Keep your PICC bandage (dressing) clean and dry to prevent infection.  Do not take baths, swim, or use a hot tub until your health care provider approves. Ask your health care provider if you can take showers. You may only be allowed to take sponge baths for bathing. When you are allowed to shower: ? Ask your health care provider to teach you how to wrap the PICC line. ? Cover the PICC line with clear plastic wrap and tape to keep it dry while showering.  Follow instructions from your health care provider about  how to take care of your insertion site and dressing. Make sure you: ? Wash your hands with soap and water before you change your bandage (dressing). If soap and water are not available, use hand sanitizer. ? Change your dressing as told by your health care provider. ? Leave stitches (sutures), skin glue, or adhesive strips in place. These skin closures may need to stay in place for 2 weeks or longer. If adhesive strip edges start to loosen and curl up, you may trim the loose edges. Do not remove adhesive strips completely unless your health care provider tells you to do  that.  Change your PICC dressing if it becomes loose or wet. General instructions  Carry your PICC identification card or wear a medical alert bracelet at all times.  Keep the tube clamped at all times, unless it is being used.  Carry a smooth-edge clamp with you at all times to place on the tube if it breaks.  Do not use scissors or sharp objects near the tube.  You may bend your arm and move it freely. If your PICC is near or at the bend of your elbow, avoid activity with repeated motion at the elbow.  Avoid lifting heavy objects as told by your health care provider.  Keep all follow-up visits as told by your health care provider. This is important.   Disposal of supplies  Throw away any syringes in a disposal container that is meant for sharp items (sharps container). You can buy a sharps container from a pharmacy, or you can make one by using an empty hard plastic bottle with a cover.  Place any used dressings or infusion bags into a plastic bag. Throw that bag in the trash. Contact a health care provider if:  You have pain in your arm, ear, face, or teeth.  You have a fever or chills.  You have redness, swelling, or pain around the insertion site.  You have fluid or blood coming from the insertion site.  Your insertion site feels warm to the touch.  You have pus or a bad smell coming from the insertion site.  Your skin feels hard and raised around the insertion site. Get help right away if:  Your PICC is accidentally pulled all the way out. If this happens, cover the insertion site with a bandage or gauze dressing. Do not throw the PICC away. Your health care provider will need to check it.  Your PICC was tugged or pulled and has partially come out. Do not  push the PICC back in.  You cannot flush the PICC, it is hard to flush, or the PICC leaks around the insertion site when it is flushed.  You hear a "flushing" sound when the PICC is flushed.  You feel your  heart racing or skipping beats.  There is a hole or tear in the PICC.  You have swelling in the arm in which the PICC was inserted.  You have a red streak going up your arm from where the PICC was inserted. Summary  A peripherally inserted central catheter (PICC) is a long, thin, flexible tube (catheter) that is inserted into a vein in the upper arm.  The PICC is inserted using a sterile technique by a specially trained nurse or physician. Only a trained clinical professional should remove it.  Keep your PICC identification card with you at all times.  Avoid blood pressure checks on the arm in which the PICC is placed.  If cared for   properly, a PICC can remain in place for several months. Having a PICC can also allow a person to go home from the hospital sooner. This information is not intended to replace advice given to you by your health care provider. Make sure you discuss any questions you have with your health care provider. Document Revised: 03/02/2020 Document Reviewed: 03/02/2020 Elsevier Patient Education  2021 [employer].  

## 2020-12-26 NOTE — Telephone Encounter (Signed)
Called critical lab results to triage nurse at [facility]. Also requested she tell nurse Misti that called earlier today, that I found out we have only changed the caps on his PICC line when he brought caps in to our office. Our caps do not fit his PICC line. Patient did not want [place] to tell them this, but this RN felt it was necessary. Also faxed labs to [phone]

## 2020-12-28 ENCOUNTER — Telehealth: Payer: Self-pay | Admitting: *Deleted

## 2020-12-28 NOTE — Telephone Encounter (Signed)
Patient called Victor [name] to ask them to make a referral for him to transfer his care to [facility] location of [facility] and to Dr. [name]. He lives closer to this location.  Will make Dr. Benay Spice aware and he can reach out to Dr. [name] to work on this transfer of care per his wishes.

## 2020-12-29 ENCOUNTER — Inpatient Hospital Stay: Payer: Medicare Other

## 2020-12-29 ENCOUNTER — Other Ambulatory Visit: Payer: Self-pay | Admitting: *Deleted

## 2020-12-29 ENCOUNTER — Other Ambulatory Visit: Payer: Self-pay

## 2020-12-29 DIAGNOSIS — Z452 Encounter for adjustment and management of vascular access device: Secondary | ICD-10-CM

## 2020-12-29 DIAGNOSIS — C9500 Acute leukemia of unspecified cell type not having achieved remission: Secondary | ICD-10-CM

## 2020-12-29 DIAGNOSIS — C9200 Acute myeloblastic leukemia, not having achieved remission: Secondary | ICD-10-CM | POA: Diagnosis not present

## 2020-12-29 DIAGNOSIS — D696 Thrombocytopenia, unspecified: Secondary | ICD-10-CM

## 2020-12-29 LAB — CBC WITH DIFFERENTIAL (CANCER CENTER ONLY)
Abs Immature Granulocytes: 0 10*3/uL (ref 0.00–0.07)
Basophils Absolute: 0 10*3/uL (ref 0.0–0.1)
Basophils Relative: 1 %
Eosinophils Absolute: 0 10*3/uL (ref 0.0–0.5)
Eosinophils Relative: 0 %
HCT: 36.4 % — ABNORMAL LOW (ref 39.0–52.0)
Hemoglobin: 12.4 g/dL — ABNORMAL LOW (ref 13.0–17.0)
Immature Granulocytes: 0 %
Lymphocytes Relative: 85 %
Lymphs Abs: 0.8 10*3/uL (ref 0.7–4.0)
MCH: 34.2 pg — ABNORMAL HIGH (ref 26.0–34.0)
MCHC: 34.1 g/dL (ref 30.0–36.0)
MCV: 100.3 fL — ABNORMAL HIGH (ref 80.0–100.0)
Monocytes Absolute: 0 10*3/uL — ABNORMAL LOW (ref 0.1–1.0)
Monocytes Relative: 4 %
Neutro Abs: 0.1 10*3/uL — CL (ref 1.7–7.7)
Neutrophils Relative %: 10 %
Platelet Count: 19 10*3/uL — ABNORMAL LOW (ref 150–400)
RBC: 3.63 MIL/uL — ABNORMAL LOW (ref 4.22–5.81)
RDW: 16.4 % — ABNORMAL HIGH (ref 11.5–15.5)
WBC Count: 0.9 10*3/uL — CL (ref 4.0–10.5)
nRBC: 0 % (ref 0.0–0.2)

## 2020-12-29 MED ORDER — SODIUM CHLORIDE 0.9% FLUSH
10.0000 mL | Freq: Once | INTRAVENOUS | Status: AC
  Administered 2020-12-29: 10 mL
  Filled 2020-12-29: qty 10

## 2020-12-29 MED ORDER — HEPARIN SOD (PORK) LOCK FLUSH 100 UNIT/ML IV SOLN
250.0000 [IU] | Freq: Once | INTRAVENOUS | Status: AC
  Administered 2020-12-29: 250 [IU]
  Filled 2020-12-29: qty 5

## 2020-12-29 NOTE — Patient Instructions (Signed)
PICC Home Care Guide A peripherally inserted central catheter (PICC) is a form of IV access that allows medicines and IV fluids to be quickly distributed throughout the body. The PICC is a long, thin, flexible tube (catheter) that is inserted into a vein in the upper arm. The catheter ends in a large vein in the chest (superior vena cava, or SVC). After the PICC is inserted, a chest X-ray may be done to make sure that it is in the correct place. A PICC may be placed for different reasons, such as:  To give medicines and liquid nutrition.  To give IV fluids and blood products.  If there is trouble placing a peripheral intravenous (PIV) catheter. If taken care of properly, a PICC can remain in place for several months. Having a PICC can also allow a person to go home from the hospital sooner. Medicine and PICC care can be managed at home by a family member, caregiver, or home health care team. What are the risks? Generally, having a PICC is safe. However, problems may occur, including:  A blood clot (thrombus) forming in or at the tip of the PICC.  A blood clot forming in a vein (deep vein thrombosis) or traveling to the lung (pulmonary embolism).  Inflammation of the vein (phlebitis) in which the PICC is placed.  Infection. Central line associated blood stream infection (CLABSI) is a serious infection that often requires hospitalization.  PICC movement (malposition). The PICC tip may move from its original position due to excessive physical activity, forceful coughing, sneezing, or vomiting.  A break or cut in the PICC. It is important not to use scissors near the PICC.  Nerve or tendon irritation or injury during PICC insertion. How to take care of your PICC Preventing problems  You and any caregivers should wash your hands often with soap. Wash hands: ? Before touching the PICC line or the infusion device. ? Before changing a bandage (dressing).  Flush the PICC as told by your  health care provider. Let your health care provider know right away if the PICC is hard to flush or does not flush. Do not use force to flush the PICC.  Do not use a syringe that is less than 10 mL to flush the PICC.  Avoid blood pressure checks on the arm in which the PICC is placed.  Never pull or tug on the PICC.  Do not take the PICC out yourself. Only a trained clinical professional should remove the PICC.  Use clean and sterile supplies only. Keep the supplies in a dry place. Do not reuse needles, syringes, or any other supplies. Doing that can lead to infection.  Keep pets and children away from your PICC line.  Check the PICC insertion site every day for signs of infection. Check for: ? Leakage. ? Redness, swelling, or pain. ? Fluid or blood. ? Warmth. ? Pus or a bad smell. PICC dressing care  Keep your PICC bandage (dressing) clean and dry to prevent infection.  Do not take baths, swim, or use a hot tub until your health care provider approves. Ask your health care provider if you can take showers. You may only be allowed to take sponge baths for bathing. When you are allowed to shower: ? Ask your health care provider to teach you how to wrap the PICC line. ? Cover the PICC line with clear plastic wrap and tape to keep it dry while showering.  Follow instructions from your health care provider about   how to take care of your insertion site and dressing. Make sure you: ? Wash your hands with soap and water before you change your bandage (dressing). If soap and water are not available, use hand sanitizer. ? Change your dressing as told by your health care provider. ? Leave stitches (sutures), skin glue, or adhesive strips in place. These skin closures may need to stay in place for 2 weeks or longer. If adhesive strip edges start to loosen and curl up, you may trim the loose edges. Do not remove adhesive strips completely unless your health care provider tells you to do  that.  Change your PICC dressing if it becomes loose or wet. General instructions  Carry your PICC identification card or wear a medical alert bracelet at all times.  Keep the tube clamped at all times, unless it is being used.  Carry a smooth-edge clamp with you at all times to place on the tube if it breaks.  Do not use scissors or sharp objects near the tube.  You may bend your arm and move it freely. If your PICC is near or at the bend of your elbow, avoid activity with repeated motion at the elbow.  Avoid lifting heavy objects as told by your health care provider.  Keep all follow-up visits as told by your health care provider. This is important.   Disposal of supplies  Throw away any syringes in a disposal container that is meant for sharp items (sharps container). You can buy a sharps container from a pharmacy, or you can make one by using an empty hard plastic bottle with a cover.  Place any used dressings or infusion bags into a plastic bag. Throw that bag in the trash. Contact a health care provider if:  You have pain in your arm, ear, face, or teeth.  You have a fever or chills.  You have redness, swelling, or pain around the insertion site.  You have fluid or blood coming from the insertion site.  Your insertion site feels warm to the touch.  You have pus or a bad smell coming from the insertion site.  Your skin feels hard and raised around the insertion site. Get help right away if:  Your PICC is accidentally pulled all the way out. If this happens, cover the insertion site with a bandage or gauze dressing. Do not throw the PICC away. Your health care provider will need to check it.  Your PICC was tugged or pulled and has partially come out. Do not  push the PICC back in.  You cannot flush the PICC, it is hard to flush, or the PICC leaks around the insertion site when it is flushed.  You hear a "flushing" sound when the PICC is flushed.  You feel your  heart racing or skipping beats.  There is a hole or tear in the PICC.  You have swelling in the arm in which the PICC was inserted.  You have a red streak going up your arm from where the PICC was inserted. Summary  A peripherally inserted central catheter (PICC) is a long, thin, flexible tube (catheter) that is inserted into a vein in the upper arm.  The PICC is inserted using a sterile technique by a specially trained nurse or physician. Only a trained clinical professional should remove it.  Keep your PICC identification card with you at all times.  Avoid blood pressure checks on the arm in which the PICC is placed.  If cared for   properly, a PICC can remain in place for several months. Having a PICC can also allow a person to go home from the hospital sooner. This information is not intended to replace advice given to you by your health care provider. Make sure you discuss any questions you have with your health care provider. Document Revised: 03/02/2020 Document Reviewed: 03/02/2020 Elsevier Patient Education  2021 Elsevier Inc.  

## 2020-12-29 NOTE — Progress Notes (Signed)
Platelet count today 19,000--per [facility] needs transfusion. Orders placed and patient instructed to come on 2/25 at 0830 for transfusion. Blood bank aware and have platelets in house. Patient is aware as well. Provided him his counts today. Orders placed. Spoke with Inocencio Homes at [facility] regarding patient request to transfer care there. He would like to start early March if possible. Report provided. She will f/u with Dr. [name] and call patient with appointment and let our office know. Informed her that he wants a port placed--tired of the PICC line. He still has not decided where he wants to have it done--here vs [facility]. Called critical labs to [facility] and faxed them as well.

## 2020-12-30 ENCOUNTER — Inpatient Hospital Stay: Payer: Medicare Other

## 2020-12-30 ENCOUNTER — Other Ambulatory Visit: Payer: Self-pay

## 2020-12-30 DIAGNOSIS — C9500 Acute leukemia of unspecified cell type not having achieved remission: Secondary | ICD-10-CM

## 2020-12-30 DIAGNOSIS — C9200 Acute myeloblastic leukemia, not having achieved remission: Secondary | ICD-10-CM | POA: Diagnosis not present

## 2020-12-30 DIAGNOSIS — D696 Thrombocytopenia, unspecified: Secondary | ICD-10-CM

## 2020-12-30 MED ORDER — SODIUM CHLORIDE 0.9% FLUSH
10.0000 mL | INTRAVENOUS | Status: AC | PRN
  Administered 2020-12-30: 10 mL
  Filled 2020-12-30: qty 10

## 2020-12-30 MED ORDER — HEPARIN SOD (PORK) LOCK FLUSH 100 UNIT/ML IV SOLN
250.0000 [IU] | INTRAVENOUS | Status: AC | PRN
  Administered 2020-12-30: 250 [IU]
  Filled 2020-12-30: qty 5

## 2020-12-30 MED ORDER — SODIUM CHLORIDE 0.9% IV SOLUTION
250.0000 mL | Freq: Once | INTRAVENOUS | Status: AC
  Administered 2020-12-30: 250 mL via INTRAVENOUS
  Filled 2020-12-30: qty 250

## 2020-12-30 NOTE — Patient Instructions (Signed)

## 2020-12-31 LAB — PREPARE PLATELET PHERESIS: Unit division: 0

## 2020-12-31 LAB — BPAM PLATELET PHERESIS
Blood Product Expiration Date: 202202262359
ISSUE DATE / TIME: 202202250856
Unit Type and Rh: 7300

## 2021-01-02 ENCOUNTER — Encounter: Payer: Self-pay | Admitting: *Deleted

## 2021-01-02 ENCOUNTER — Inpatient Hospital Stay: Payer: Medicare Other

## 2021-01-02 ENCOUNTER — Telehealth: Payer: Self-pay

## 2021-01-02 ENCOUNTER — Other Ambulatory Visit: Payer: Self-pay

## 2021-01-02 DIAGNOSIS — C9200 Acute myeloblastic leukemia, not having achieved remission: Secondary | ICD-10-CM | POA: Diagnosis not present

## 2021-01-02 DIAGNOSIS — Z452 Encounter for adjustment and management of vascular access device: Secondary | ICD-10-CM

## 2021-01-02 DIAGNOSIS — C9500 Acute leukemia of unspecified cell type not having achieved remission: Secondary | ICD-10-CM

## 2021-01-02 LAB — CBC WITH DIFFERENTIAL (CANCER CENTER ONLY)
Abs Immature Granulocytes: 0.01 10*3/uL (ref 0.00–0.07)
Basophils Absolute: 0 10*3/uL (ref 0.0–0.1)
Basophils Relative: 2 %
Eosinophils Absolute: 0 10*3/uL (ref 0.0–0.5)
Eosinophils Relative: 0 %
HCT: 35.4 % — ABNORMAL LOW (ref 39.0–52.0)
Hemoglobin: 11.8 g/dL — ABNORMAL LOW (ref 13.0–17.0)
Immature Granulocytes: 1 %
Lymphocytes Relative: 72 %
Lymphs Abs: 0.7 10*3/uL (ref 0.7–4.0)
MCH: 33.6 pg (ref 26.0–34.0)
MCHC: 33.3 g/dL (ref 30.0–36.0)
MCV: 100.9 fL — ABNORMAL HIGH (ref 80.0–100.0)
Monocytes Absolute: 0.1 10*3/uL (ref 0.1–1.0)
Monocytes Relative: 10 %
Neutro Abs: 0.2 10*3/uL — CL (ref 1.7–7.7)
Neutrophils Relative %: 15 %
Platelet Count: 35 10*3/uL — ABNORMAL LOW (ref 150–400)
RBC: 3.51 MIL/uL — ABNORMAL LOW (ref 4.22–5.81)
RDW: 16.4 % — ABNORMAL HIGH (ref 11.5–15.5)
WBC Count: 1 10*3/uL — ABNORMAL LOW (ref 4.0–10.5)
nRBC: 2 % — ABNORMAL HIGH (ref 0.0–0.2)

## 2021-01-02 LAB — CMP (CANCER CENTER ONLY)
ALT: 10 U/L (ref 0–44)
AST: 13 U/L — ABNORMAL LOW (ref 15–41)
Albumin: 3.6 g/dL (ref 3.5–5.0)
Alkaline Phosphatase: 52 U/L (ref 38–126)
Anion gap: 8 (ref 5–15)
BUN: 18 mg/dL (ref 8–23)
CO2: 24 mmol/L (ref 22–32)
Calcium: 8.5 mg/dL — ABNORMAL LOW (ref 8.9–10.3)
Chloride: 108 mmol/L (ref 98–111)
Creatinine: 1 mg/dL (ref 0.61–1.24)
GFR, Estimated: >60 mL/min (ref >60)
Glucose, Bld: 87 mg/dL (ref 70–99)
Potassium: 3.8 mmol/L (ref 3.5–5.1)
Sodium: 140 mmol/L (ref 135–145)
Total Bilirubin: 0.5 mg/dL (ref 0.3–1.2)
Total Protein: 5.5 g/dL — ABNORMAL LOW (ref 6.5–8.1)

## 2021-01-02 LAB — MAGNESIUM: Magnesium: 2.1 mg/dL (ref 1.7–2.4)

## 2021-01-02 MED ORDER — HEPARIN SOD (PORK) LOCK FLUSH 100 UNIT/ML IV SOLN
250.0000 [IU] | Freq: Once | INTRAVENOUS | Status: AC
Start: 2021-01-02 — End: 2021-01-02
  Administered 2021-01-02: 250 [IU]
  Filled 2021-01-02: qty 5

## 2021-01-02 MED ORDER — SODIUM CHLORIDE 0.9% FLUSH
10.0000 mL | Freq: Once | INTRAVENOUS | Status: AC
  Administered 2021-01-02: 10 mL
  Filled 2021-01-02: qty 10

## 2021-01-02 NOTE — Progress Notes (Signed)
Discussed CBC results with patient today and all labs faxed to [facility] [phone] with notation that he will transfer care on 01/09/21 to Dr. Burney Gauze.

## 2021-01-02 NOTE — Patient Instructions (Signed)
PICC Home Care Guide A peripherally inserted central catheter (PICC) is a form of IV access that allows medicines and IV fluids to be quickly distributed throughout the body. The PICC is a long, thin, flexible tube (catheter) that is inserted into a vein in the upper arm. The catheter ends in a large vein in the chest (superior vena cava, or SVC). After the PICC is inserted, a chest X-ray may be done to make sure that it is in the correct place. A PICC may be placed for different reasons, such as:  To give medicines and liquid nutrition.  To give IV fluids and blood products.  If there is trouble placing a peripheral intravenous (PIV) catheter. If taken care of properly, a PICC can remain in place for several months. Having a PICC can also allow a person to go home from the hospital sooner. Medicine and PICC care can be managed at home by a family member, caregiver, or home health care team. What are the risks? Generally, having a PICC is safe. However, problems may occur, including:  A blood clot (thrombus) forming in or at the tip of the PICC.  A blood clot forming in a vein (deep vein thrombosis) or traveling to the lung (pulmonary embolism).  Inflammation of the vein (phlebitis) in which the PICC is placed.  Infection. Central line associated blood stream infection (CLABSI) is a serious infection that often requires hospitalization.  PICC movement (malposition). The PICC tip may move from its original position due to excessive physical activity, forceful coughing, sneezing, or vomiting.  A break or cut in the PICC. It is important not to use scissors near the PICC.  Nerve or tendon irritation or injury during PICC insertion. How to take care of your PICC Preventing problems  You and any caregivers should wash your hands often with soap. Wash hands: ? Before touching the PICC line or the infusion device. ? Before changing a bandage (dressing).  Flush the PICC as told by your  health care provider. Let your health care provider know right away if the PICC is hard to flush or does not flush. Do not use force to flush the PICC.  Do not use a syringe that is less than 10 mL to flush the PICC.  Avoid blood pressure checks on the arm in which the PICC is placed.  Never pull or tug on the PICC.  Do not take the PICC out yourself. Only a trained clinical professional should remove the PICC.  Use clean and sterile supplies only. Keep the supplies in a dry place. Do not reuse needles, syringes, or any other supplies. Doing that can lead to infection.  Keep pets and children away from your PICC line.  Check the PICC insertion site every day for signs of infection. Check for: ? Leakage. ? Redness, swelling, or pain. ? Fluid or blood. ? Warmth. ? Pus or a bad smell. PICC dressing care  Keep your PICC bandage (dressing) clean and dry to prevent infection.  Do not take baths, swim, or use a hot tub until your health care provider approves. Ask your health care provider if you can take showers. You may only be allowed to take sponge baths for bathing. When you are allowed to shower: ? Ask your health care provider to teach you how to wrap the PICC line. ? Cover the PICC line with clear plastic wrap and tape to keep it dry while showering.  Follow instructions from your health care provider about   how to take care of your insertion site and dressing. Make sure you: ? Wash your hands with soap and water before you change your bandage (dressing). If soap and water are not available, use hand sanitizer. ? Change your dressing as told by your health care provider. ? Leave stitches (sutures), skin glue, or adhesive strips in place. These skin closures may need to stay in place for 2 weeks or longer. If adhesive strip edges start to loosen and curl up, you may trim the loose edges. Do not remove adhesive strips completely unless your health care provider tells you to do  that.  Change your PICC dressing if it becomes loose or wet. General instructions  Carry your PICC identification card or wear a medical alert bracelet at all times.  Keep the tube clamped at all times, unless it is being used.  Carry a smooth-edge clamp with you at all times to place on the tube if it breaks.  Do not use scissors or sharp objects near the tube.  You may bend your arm and move it freely. If your PICC is near or at the bend of your elbow, avoid activity with repeated motion at the elbow.  Avoid lifting heavy objects as told by your health care provider.  Keep all follow-up visits as told by your health care provider. This is important.   Disposal of supplies  Throw away any syringes in a disposal container that is meant for sharp items (sharps container). You can buy a sharps container from a pharmacy, or you can make one by using an empty hard plastic bottle with a cover.  Place any used dressings or infusion bags into a plastic bag. Throw that bag in the trash. Contact a health care provider if:  You have pain in your arm, ear, face, or teeth.  You have a fever or chills.  You have redness, swelling, or pain around the insertion site.  You have fluid or blood coming from the insertion site.  Your insertion site feels warm to the touch.  You have pus or a bad smell coming from the insertion site.  Your skin feels hard and raised around the insertion site. Get help right away if:  Your PICC is accidentally pulled all the way out. If this happens, cover the insertion site with a bandage or gauze dressing. Do not throw the PICC away. Your health care provider will need to check it.  Your PICC was tugged or pulled and has partially come out. Do not  push the PICC back in.  You cannot flush the PICC, it is hard to flush, or the PICC leaks around the insertion site when it is flushed.  You hear a "flushing" sound when the PICC is flushed.  You feel your  heart racing or skipping beats.  There is a hole or tear in the PICC.  You have swelling in the arm in which the PICC was inserted.  You have a red streak going up your arm from where the PICC was inserted. Summary  A peripherally inserted central catheter (PICC) is a long, thin, flexible tube (catheter) that is inserted into a vein in the upper arm.  The PICC is inserted using a sterile technique by a specially trained nurse or physician. Only a trained clinical professional should remove it.  Keep your PICC identification card with you at all times.  Avoid blood pressure checks on the arm in which the PICC is placed.  If cared for   properly, a PICC can remain in place for several months. Having a PICC can also allow a person to go home from the hospital sooner. This information is not intended to replace advice given to you by your health care provider. Make sure you discuss any questions you have with your health care provider. Document Revised: 03/02/2020 Document Reviewed: 03/02/2020 Elsevier Patient Education  2021 Elsevier Inc.  

## 2021-01-02 NOTE — Telephone Encounter (Signed)
CRITICAL VALUE STICKER  CRITICAL VALUE:ANC 0.2  RECEIVER (on-site recipient of call):Victor [name]  DATE & TIME NOTIFIED: 01/02/21  0955  MESSENGER (representative from lab):stephen  MD NOTIFIED: Dr. Benay Spice   TIME OF NOTIFICATION:10am  RESPONSE: MD aware

## 2021-01-05 ENCOUNTER — Encounter: Payer: Self-pay | Admitting: Nurse Practitioner

## 2021-01-05 ENCOUNTER — Telehealth: Payer: Self-pay

## 2021-01-05 ENCOUNTER — Inpatient Hospital Stay (HOSPITAL_BASED_OUTPATIENT_CLINIC_OR_DEPARTMENT_OTHER): Payer: Medicare Other | Admitting: Nurse Practitioner

## 2021-01-05 ENCOUNTER — Other Ambulatory Visit: Payer: Self-pay

## 2021-01-05 ENCOUNTER — Inpatient Hospital Stay: Payer: Medicare Other

## 2021-01-05 ENCOUNTER — Encounter: Payer: Self-pay | Admitting: *Deleted

## 2021-01-05 ENCOUNTER — Inpatient Hospital Stay: Payer: Medicare Other | Attending: Oncology

## 2021-01-05 VITALS — BP 135/56 | HR 78 | Temp 97.8°F | Resp 17 | Ht 70.0 in | Wt 198.5 lb

## 2021-01-05 DIAGNOSIS — N179 Acute kidney failure, unspecified: Secondary | ICD-10-CM | POA: Diagnosis not present

## 2021-01-05 DIAGNOSIS — Z79899 Other long term (current) drug therapy: Secondary | ICD-10-CM | POA: Diagnosis not present

## 2021-01-05 DIAGNOSIS — C9200 Acute myeloblastic leukemia, not having achieved remission: Secondary | ICD-10-CM | POA: Diagnosis present

## 2021-01-05 DIAGNOSIS — Z5111 Encounter for antineoplastic chemotherapy: Secondary | ICD-10-CM | POA: Diagnosis present

## 2021-01-05 DIAGNOSIS — E890 Postprocedural hypothyroidism: Secondary | ICD-10-CM | POA: Diagnosis not present

## 2021-01-05 DIAGNOSIS — E8770 Fluid overload, unspecified: Secondary | ICD-10-CM | POA: Insufficient documentation

## 2021-01-05 DIAGNOSIS — D696 Thrombocytopenia, unspecified: Secondary | ICD-10-CM | POA: Diagnosis not present

## 2021-01-05 DIAGNOSIS — D61818 Other pancytopenia: Secondary | ICD-10-CM | POA: Insufficient documentation

## 2021-01-05 DIAGNOSIS — Z452 Encounter for adjustment and management of vascular access device: Secondary | ICD-10-CM

## 2021-01-05 DIAGNOSIS — C9500 Acute leukemia of unspecified cell type not having achieved remission: Secondary | ICD-10-CM

## 2021-01-05 LAB — CBC WITH DIFFERENTIAL (CANCER CENTER ONLY)
Abs Immature Granulocytes: 0.02 10*3/uL (ref 0.00–0.07)
Basophils Absolute: 0 10*3/uL (ref 0.0–0.1)
Basophils Relative: 1 %
Eosinophils Absolute: 0 10*3/uL (ref 0.0–0.5)
Eosinophils Relative: 0 %
HCT: 34.5 % — ABNORMAL LOW (ref 39.0–52.0)
Hemoglobin: 11.7 g/dL — ABNORMAL LOW (ref 13.0–17.0)
Immature Granulocytes: 1 %
Lymphocytes Relative: 60 %
Lymphs Abs: 0.8 10*3/uL (ref 0.7–4.0)
MCH: 34.2 pg — ABNORMAL HIGH (ref 26.0–34.0)
MCHC: 33.9 g/dL (ref 30.0–36.0)
MCV: 100.9 fL — ABNORMAL HIGH (ref 80.0–100.0)
Monocytes Absolute: 0.3 10*3/uL (ref 0.1–1.0)
Monocytes Relative: 18 %
Neutro Abs: 0.3 10*3/uL — CL (ref 1.7–7.7)
Neutrophils Relative %: 20 %
Platelet Count: 38 10*3/uL — ABNORMAL LOW (ref 150–400)
RBC: 3.42 MIL/uL — ABNORMAL LOW (ref 4.22–5.81)
RDW: 16.4 % — ABNORMAL HIGH (ref 11.5–15.5)
WBC Count: 1.4 10*3/uL — ABNORMAL LOW (ref 4.0–10.5)
nRBC: 0 % (ref 0.0–0.2)

## 2021-01-05 MED ORDER — HEPARIN SOD (PORK) LOCK FLUSH 100 UNIT/ML IV SOLN
250.0000 [IU] | Freq: Once | INTRAVENOUS | Status: AC
  Administered 2021-01-05: 250 [IU]
  Filled 2021-01-05: qty 5

## 2021-01-05 MED ORDER — SODIUM CHLORIDE 0.9% FLUSH
10.0000 mL | Freq: Once | INTRAVENOUS | Status: AC
  Administered 2021-01-05: 10 mL
  Filled 2021-01-05: qty 10

## 2021-01-05 MED ORDER — SODIUM CHLORIDE 0.9% FLUSH
10.0000 mL | Freq: Once | INTRAVENOUS | Status: AC
Start: 2021-01-05 — End: 2021-01-05
  Administered 2021-01-05: 10 mL
  Filled 2021-01-05: qty 10

## 2021-01-05 NOTE — Progress Notes (Signed)
Faxed CBC results today to [facility] [phone] and noted on cover sheet his new oncologist, Dr. Burney Gauze beginning 01/09/21 and provide his phone and fax #. Faxed [facility] orders for chemo, testing, and parameters for transfusion to Dr. Antonieta Pert office.

## 2021-01-05 NOTE — Progress Notes (Addendum)
  [facility] OFFICE PROGRESS NOTE   Diagnosis:  AML  INTERVAL HISTORY:   Victor [name] returns as scheduled.  He completed cycle 4 azacitidine and venetoclax beginning 12/12/2020, prematurely discontinued due to progressive thrombocytopenia.  He denies fever.  No other signs of infection.  He denies bleeding.  Stable bruising on the forearms.  He denies pain.  He reports a good appetite.  No nausea, vomiting, diarrhea  Objective:  Vital signs in last 24 hours:  Blood pressure (!) 135/56, pulse 78, temperature 97.8 F (36.6 C), temperature source Tympanic, resp. rate 17, height $RemoveBe'5\' 10"'lYuEVbZiG$  (1.778 m), weight 198 lb 8 oz (90 kg), SpO2 96 %.    HEENT: No thrush or ulcers. Resp: Lungs clear bilaterally. Cardio: Regular rate and rhythm. GI: No hepatosplenomegaly. Vascular: Trace lower leg edema bilaterally. Neuro: Alert and oriented. Skin: Ecchymoses scattered over the forearms. Right upper extremity PICC site without erythema.   Lab Results:  Lab Results  Component Value Date   WBC 1.4 (L) 01/05/2021   HGB 11.7 (L) 01/05/2021   HCT 34.5 (L) 01/05/2021   MCV 100.9 (H) 01/05/2021   PLT 38 (L) 01/05/2021   NEUTROABS 0.3 (LL) 01/05/2021    Imaging:  No results found.  Medications: I have reviewed the patient's current medications.  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Pancytopenia secondary to #1 and chemotherapy 3.Right PICC placed August 08, 2020 4.TTE August 08, 2020-EF 60-65% 5.Fever during hospital admission October 2021, suspicion of pneumonia, treated with cefepime and discharged on Levaquin 6.Hypervolemia with respiratory failure during hospital admission October 2021-diuresis, home Lasix 7.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  He has completed 4 cycles of azacitidine and venetoclax.  Cycle 4 was abbreviated due to progressive thrombocytopenia.  We reviewed the CBC from today.  Blood counts continue to improve.  He understands to contact the office with fever, chills, other signs of infection, bleeding.  He is transitioning oncology care to [name] and is scheduled for an initial visit on 01/09/2021.  We are available to see him in the future if needed.  Patient seen with Dr. Benay Spice.    [name] ANP/GNP-BC   01/05/2021  10:56 AM This was a shared visit with [name].  VictorPruitt is now day 25 following cycle 4 of 5 azacytidine and venetoclax.  The white count and platelets appear to be recovering.  He has decided to transfer his care to Dr. [name] at [facility].  I am available to see him in the future as needed.  [name], MD

## 2021-01-05 NOTE — Telephone Encounter (Signed)
CRITICAL VALUE STICKER  CRITICAL VALUE: ANC = 0.3  RECEIVER (on-site recipient of call): [name], [name] NOTIFIED: 01/05/21 at 10:53am  MESSENGER (representative from lab): [name]  MD NOTIFIED: [name], NP  [place]: 01/05/21 at 10:57am  RESPONSE: Notification given directly to Victor Moores, NP for follow-up with the pt.

## 2021-01-06 ENCOUNTER — Telehealth: Payer: Self-pay | Admitting: Nurse Practitioner

## 2021-01-06 ENCOUNTER — Other Ambulatory Visit: Payer: Self-pay | Admitting: *Deleted

## 2021-01-06 DIAGNOSIS — C9500 Acute leukemia of unspecified cell type not having achieved remission: Secondary | ICD-10-CM

## 2021-01-06 NOTE — Telephone Encounter (Signed)
No 3/3 los. No changes made to pt's schedule.

## 2021-01-09 ENCOUNTER — Inpatient Hospital Stay: Payer: Medicare Other

## 2021-01-09 ENCOUNTER — Encounter: Payer: Self-pay | Admitting: Hematology & Oncology

## 2021-01-09 ENCOUNTER — Inpatient Hospital Stay: Payer: Medicare Other | Admitting: Hematology & Oncology

## 2021-01-09 ENCOUNTER — Other Ambulatory Visit: Payer: Self-pay

## 2021-01-09 VITALS — BP 131/55 | HR 76 | Temp 97.9°F | Resp 20 | Wt 199.4 lb

## 2021-01-09 DIAGNOSIS — C9500 Acute leukemia of unspecified cell type not having achieved remission: Secondary | ICD-10-CM

## 2021-01-09 DIAGNOSIS — C969 Malignant neoplasm of lymphoid, hematopoietic and related tissue, unspecified: Secondary | ICD-10-CM

## 2021-01-09 DIAGNOSIS — C9200 Acute myeloblastic leukemia, not having achieved remission: Secondary | ICD-10-CM | POA: Diagnosis not present

## 2021-01-09 DIAGNOSIS — C9300 Acute monoblastic/monocytic leukemia, not having achieved remission: Secondary | ICD-10-CM

## 2021-01-09 DIAGNOSIS — Z452 Encounter for adjustment and management of vascular access device: Secondary | ICD-10-CM

## 2021-01-09 LAB — CMP (CANCER CENTER ONLY)
ALT: 8 U/L (ref 0–44)
AST: 12 U/L — ABNORMAL LOW (ref 15–41)
Albumin: 4.3 g/dL (ref 3.5–5.0)
Alkaline Phosphatase: 56 U/L (ref 38–126)
Anion gap: 7 (ref 5–15)
BUN: 18 mg/dL (ref 8–23)
CO2: 31 mmol/L (ref 22–32)
Calcium: 9.2 mg/dL (ref 8.9–10.3)
Chloride: 105 mmol/L (ref 98–111)
Creatinine: 1.04 mg/dL (ref 0.61–1.24)
GFR, Estimated: >60 mL/min (ref >60)
Glucose, Bld: 84 mg/dL (ref 70–99)
Potassium: 3.8 mmol/L (ref 3.5–5.1)
Sodium: 143 mmol/L (ref 135–145)
Total Bilirubin: 0.5 mg/dL (ref 0.3–1.2)
Total Protein: 5.8 g/dL — ABNORMAL LOW (ref 6.5–8.1)

## 2021-01-09 LAB — CBC WITH DIFFERENTIAL (CANCER CENTER ONLY)
Abs Immature Granulocytes: 0.05 10*3/uL (ref 0.00–0.07)
Basophils Absolute: 0 10*3/uL (ref 0.0–0.1)
Basophils Relative: 1 %
Eosinophils Absolute: 0 10*3/uL (ref 0.0–0.5)
Eosinophils Relative: 0 %
HCT: 38.5 % — ABNORMAL LOW (ref 39.0–52.0)
Hemoglobin: 12.8 g/dL — ABNORMAL LOW (ref 13.0–17.0)
Immature Granulocytes: 2 %
Lymphocytes Relative: 46 %
Lymphs Abs: 1.1 10*3/uL (ref 0.7–4.0)
MCH: 33.9 pg (ref 26.0–34.0)
MCHC: 33.2 g/dL (ref 30.0–36.0)
MCV: 101.9 fL — ABNORMAL HIGH (ref 80.0–100.0)
Monocytes Absolute: 0.6 10*3/uL (ref 0.1–1.0)
Monocytes Relative: 26 %
Neutro Abs: 0.6 10*3/uL — ABNORMAL LOW (ref 1.7–7.7)
Neutrophils Relative %: 25 %
Platelet Count: 56 10*3/uL — ABNORMAL LOW (ref 150–400)
RBC: 3.78 MIL/uL — ABNORMAL LOW (ref 4.22–5.81)
RDW: 16.6 % — ABNORMAL HIGH (ref 11.5–15.5)
WBC Count: 2.4 10*3/uL — ABNORMAL LOW (ref 4.0–10.5)
nRBC: 0.8 % — ABNORMAL HIGH (ref 0.0–0.2)

## 2021-01-09 LAB — MAGNESIUM: Magnesium: 2.1 mg/dL (ref 1.7–2.4)

## 2021-01-09 MED ORDER — SODIUM CHLORIDE 0.9% FLUSH
10.0000 mL | Freq: Once | INTRAVENOUS | Status: AC
  Administered 2021-01-09: 10 mL via INTRAVENOUS
  Filled 2021-01-09: qty 10

## 2021-01-09 MED ORDER — HEPARIN SOD (PORK) LOCK FLUSH 100 UNIT/ML IV SOLN
250.0000 [IU] | Freq: Once | INTRAVENOUS | Status: AC
  Administered 2021-01-09: 250 [IU] via INTRAVENOUS
  Filled 2021-01-09: qty 5

## 2021-01-09 NOTE — Progress Notes (Incomplete)
  [facility] OFFICE PROGRESS NOTE   Diagnosis:  AML  INTERVAL HISTORY:   [name] returns as scheduled.  He completed cycle 4 azacitidine and venetoclax beginning 12/12/2020, prematurely discontinued due to progressive thrombocytopenia.  He denies fever.  No other signs of infection.  He denies bleeding.  Stable bruising on the forearms.  He denies pain.  He reports a good appetite.  No nausea, vomiting, diarrhea  Objective:  Vital signs in last 24 hours:  There were no vitals taken for this visit.    HEENT: No thrush or ulcers. Resp: Lungs clear bilaterally. Cardio: Regular rate and rhythm. GI: No hepatosplenomegaly. Vascular: Trace lower leg edema bilaterally. Neuro: Alert and oriented. Skin: Ecchymoses scattered over the forearms. Right upper extremity PICC site without erythema.   Lab Results:  Lab Results  Component Value Date   WBC 2.4 (L) 01/09/2021   HGB 12.8 (L) 01/09/2021   HCT 38.5 (L) 01/09/2021   MCV 101.9 (H) 01/09/2021   PLT 56 (L) 01/09/2021   NEUTROABS 0.6 (L) 01/09/2021    Imaging:  No results found.  Medications: I have reviewed the patient's current medications.  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Pancytopenia secondary to #1 and chemotherapy 3.Right PICC placed August 08, 2020 4.TTE August 08, 2020-EF 60-65% 5.Fever during hospital admission October 2021, suspicion of pneumonia, treated with cefepime and discharged on Levaquin 6.Hypervolemia with respiratory failure during hospital admission October 2021-diuresis, home Lasix 7.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  He has completed 4 cycles of azacitidine and venetoclax.  Cycle 4 was abbreviated due to progressive thrombocytopenia.  We reviewed the CBC from today.  Blood counts continue to improve.  He understands to contact the office with fever, chills, other signs of infection, bleeding.  He is transitioning oncology care to Dr. [name] in [facility] and is scheduled for an initial visit on 01/09/2021.  We are available to see him in the future if needed.  Patient seen with Dr. Benay Spice.    Volanda Napoleon ANP/GNP-BC   01/09/2021  10:47 AM This was a shared visit with [name].  Victor [name] is now day 25 following cycle 4 of 5 azacytidine and venetoclax.  The white count and platelets appear to be recovering.  He has decided to transfer his care to Dr. [name] at [facility].  I am available to see him in the future as needed.  [name], MD

## 2021-01-09 NOTE — Patient Instructions (Signed)
PICC Home Care Guide A peripherally inserted central catheter (PICC) is a form of IV access that allows medicines and IV fluids to be quickly distributed throughout the body. The PICC is a long, thin, flexible tube (catheter) that is inserted into a vein in the upper arm. The catheter ends in a large vein in the chest (superior vena cava, or SVC). After the PICC is inserted, a chest X-ray may be done to make sure that it is in the correct place. A PICC may be placed for different reasons, such as:  To give medicines and liquid nutrition.  To give IV fluids and blood products.  If there is trouble placing a peripheral intravenous (PIV) catheter. If taken care of properly, a PICC can remain in place for several months. Having a PICC can also allow a person to go home from the hospital sooner. Medicine and PICC care can be managed at home by a family member, caregiver, or home health care team. What are the risks? Generally, having a PICC is safe. However, problems may occur, including:  A blood clot (thrombus) forming in or at the tip of the PICC.  A blood clot forming in a vein (deep vein thrombosis) or traveling to the lung (pulmonary embolism).  Inflammation of the vein (phlebitis) in which the PICC is placed.  Infection. Central line associated blood stream infection (CLABSI) is a serious infection that often requires hospitalization.  PICC movement (malposition). The PICC tip may move from its original position due to excessive physical activity, forceful coughing, sneezing, or vomiting.  A break or cut in the PICC. It is important not to use scissors near the PICC.  Nerve or tendon irritation or injury during PICC insertion. How to take care of your PICC Preventing problems  You and any caregivers should wash your hands often with soap. Wash hands: ? Before touching the PICC line or the infusion device. ? Before changing a bandage (dressing).  Flush the PICC as told by your  health care provider. Let your health care provider know right away if the PICC is hard to flush or does not flush. Do not use force to flush the PICC.  Do not use a syringe that is less than 10 mL to flush the PICC.  Avoid blood pressure checks on the arm in which the PICC is placed.  Never pull or tug on the PICC.  Do not take the PICC out yourself. Only a trained clinical professional should remove the PICC.  Use clean and sterile supplies only. Keep the supplies in a dry place. Do not reuse needles, syringes, or any other supplies. Doing that can lead to infection.  Keep pets and children away from your PICC line.  Check the PICC insertion site every day for signs of infection. Check for: ? Leakage. ? Redness, swelling, or pain. ? Fluid or blood. ? Warmth. ? Pus or a bad smell. PICC dressing care  Keep your PICC bandage (dressing) clean and dry to prevent infection.  Do not take baths, swim, or use a hot tub until your health care provider approves. Ask your health care provider if you can take showers. You may only be allowed to take sponge baths for bathing. When you are allowed to shower: ? Ask your health care provider to teach you how to wrap the PICC line. ? Cover the PICC line with clear plastic wrap and tape to keep it dry while showering.  Follow instructions from your health care provider about   how to take care of your insertion site and dressing. Make sure you: ? Wash your hands with soap and water before you change your bandage (dressing). If soap and water are not available, use hand sanitizer. ? Change your dressing as told by your health care provider. ? Leave stitches (sutures), skin glue, or adhesive strips in place. These skin closures may need to stay in place for 2 weeks or longer. If adhesive strip edges start to loosen and curl up, you may trim the loose edges. Do not remove adhesive strips completely unless your health care provider tells you to do  that.  Change your PICC dressing if it becomes loose or wet. General instructions  Carry your PICC identification card or wear a medical alert bracelet at all times.  Keep the tube clamped at all times, unless it is being used.  Carry a smooth-edge clamp with you at all times to place on the tube if it breaks.  Do not use scissors or sharp objects near the tube.  You may bend your arm and move it freely. If your PICC is near or at the bend of your elbow, avoid activity with repeated motion at the elbow.  Avoid lifting heavy objects as told by your health care provider.  Keep all follow-up visits as told by your health care provider. This is important.   Disposal of supplies  Throw away any syringes in a disposal container that is meant for sharp items (sharps container). You can buy a sharps container from a pharmacy, or you can make one by using an empty hard plastic bottle with a cover.  Place any used dressings or infusion bags into a plastic bag. Throw that bag in the trash. Contact a health care provider if:  You have pain in your arm, ear, face, or teeth.  You have a fever or chills.  You have redness, swelling, or pain around the insertion site.  You have fluid or blood coming from the insertion site.  Your insertion site feels warm to the touch.  You have pus or a bad smell coming from the insertion site.  Your skin feels hard and raised around the insertion site. Get help right away if:  Your PICC is accidentally pulled all the way out. If this happens, cover the insertion site with a bandage or gauze dressing. Do not throw the PICC away. Your health care provider will need to check it.  Your PICC was tugged or pulled and has partially come out. Do not  push the PICC back in.  You cannot flush the PICC, it is hard to flush, or the PICC leaks around the insertion site when it is flushed.  You hear a "flushing" sound when the PICC is flushed.  You feel your  heart racing or skipping beats.  There is a hole or tear in the PICC.  You have swelling in the arm in which the PICC was inserted.  You have a red streak going up your arm from where the PICC was inserted. Summary  A peripherally inserted central catheter (PICC) is a long, thin, flexible tube (catheter) that is inserted into a vein in the upper arm.  The PICC is inserted using a sterile technique by a specially trained nurse or physician. Only a trained clinical professional should remove it.  Keep your PICC identification card with you at all times.  Avoid blood pressure checks on the arm in which the PICC is placed.  If cared for   properly, a PICC can remain in place for several months. Having a PICC can also allow a person to go home from the hospital sooner. This information is not intended to replace advice given to you by your health care provider. Make sure you discuss any questions you have with your health care provider. Document Revised: 03/02/2020 Document Reviewed: 03/02/2020 Elsevier Patient Education  2021 Elsevier Inc.  

## 2021-01-10 ENCOUNTER — Telehealth: Payer: Self-pay | Admitting: *Deleted

## 2021-01-10 NOTE — Telephone Encounter (Signed)
No los 01/09/21 to schedule 

## 2021-01-11 ENCOUNTER — Telehealth: Payer: Self-pay | Admitting: Hematology & Oncology

## 2021-01-11 NOTE — Progress Notes (Unsigned)
Hematology and Oncology Follow Up Visit  Victor [name] [id number] [birth year] 85 y.o. 01/11/2021   Principle Diagnosis:   Acute myeloid leukemia - FLT3 (+)    Current Therapy:    Vidaza/Venetoclax.--  S/p cycle #3     Interim History:  Victor [name] is back for follow-up.  This is his first visit to the [place].  Victor [name] does live close by.  Victor [name] is doing quite well.  The big problem is the Port-A-Cath that Victor [name] needs to have.  Is a PICC line in right now.  This is quite a inconvenience for him.  Victor [name] is going go to [facility] for the Port-A-Cath I think on Monday.  Victor [name] has had a very nice response to the azacitidine/venetoclax combination.  Victor [name] does have some pancytopenia but this is improving.  Victor [name] has last bone marrow biopsy I think back in October 2021 which showed no increase in blast cells.  Victor [name] goes to [facility] and sees Dr. Linus Orn who is managing his acute leukemia protocol.  We are treating him in our office because Victor [name] lives close by.  I think Victor [name] sees Dr. Linus Orn later this week.  Victor [name] has had no bleeding.  Victor [name] has had no fever.  Victor [name] has had no nausea or vomiting.  There has been no diarrhea.  Victor [name] has had no rashes.  Victor [name] does have some ecchymoses.  Victor [name] comes in with his wife.  Victor [name] is incredibly nice.  Victor [name] was in the [employer].  As such, Victor [name] is a true American hero.  His family is originally from [place].  As such we have all of this in, and since I was raised in [place] and my mom's side of the family is from [place].  I would have to say that Victor [name] has done very nicely considering his age.  Overall, his performance status is ECOG 1.  Medications:  Current Outpatient Medications:  .  acetaminophen (TYLENOL) 500 MG tablet, Take 1,000 mg by mouth every 6 (six) hours as needed., Disp: , Rfl:  .  acyclovir (ZOVIRAX) 400 MG tablet, Take 1 tablet (400 mg total) by mouth 2 (two) times daily as needed., Disp: 60 tablet, Rfl: 2 .  doxazosin (CARDURA) 2 MG tablet, Take 1 tablet (2 mg  total) by mouth at bedtime., Disp: 90 tablet, Rfl: 3 .  fluconazole (DIFLUCAN) 200 MG tablet, Take 200 mg by mouth daily., Disp: , Rfl:  .  fluticasone (FLONASE) 50 MCG/ACT nasal spray, Place 2 sprays into the nose 2 (two) times daily as needed., Disp: , Rfl:  .  HEMP OIL-VANILLYL BUTYL ETHER EX, Apply topically daily., Disp: , Rfl:  .  HYDROcodone-acetaminophen (NORCO) 5-325 MG tablet, Take 1 tablet by mouth every 6 (six) hours as needed for moderate pain., Disp: 40 tablet, Rfl: 0 .  ibuprofen (ADVIL) 200 MG tablet, Take 400 mg by mouth every 6 (six) hours as needed., Disp: , Rfl:  .  levofloxacin (LEVAQUIN) 500 MG tablet, Take 500 mg by mouth daily., Disp: , Rfl:  .  levothyroxine (SYNTHROID) 175 MCG tablet, Take 175 mcg by mouth daily., Disp: , Rfl:  .  Loratadine (CLARITIN PO), Take 10 mg by mouth at bedtime. Allertec, Disp: , Rfl:  .  Melatonin 5 MG CHEW, Chew 1 tablet by mouth daily as needed. , Disp: , Rfl:  .  omeprazole (PRILOSEC) 20 MG capsule, Take 1 capsule (20 mg total) by mouth 2 (two) times daily before a meal., Disp: 180 capsule,  Rfl: 3 .  potassium chloride (MICRO-K) 10 MEQ CR capsule, Take 10 mEq by mouth in the morning and at bedtime., Disp: , Rfl:  .  propranolol ER (INDERAL LA) 80 MG 24 hr capsule, Take 1 capsule (80 mg total) by mouth daily., Disp: 90 capsule, Rfl: 3 .  simvastatin (ZOCOR) 40 MG tablet, Take 1 tablet (40 mg total) by mouth at bedtime., Disp: 90 tablet, Rfl: 3 .  hydrALAZINE (APRESOLINE) 25 MG tablet, Take 1 tablet (25 mg total) by mouth 3 (three) times daily as needed. Take if standing BP >140/80 mm Hg (Patient not taking: Reported on 01/09/2021), Disp: 90 tablet, Rfl: 2 .  venetoclax 100 MG TABS, Take 200 mg by mouth daily. (Patient not taking: Reported on 01/09/2021), Disp: , Rfl:  No current facility-administered medications for this visit.  Facility-Administered Medications Ordered in Other Visits:  .  sodium chloride flush (NS) 0.9 % injection 10 mL, 10 mL,  Intracatheter, PRN, Ladell Pier, MD, 10 mL at 11/03/20 1625  Allergies: No Known Allergies  Past Medical History, Surgical history, Social history, and Family History were reviewed and updated.  Review of Systems: Review of Systems  Constitutional: Negative.   HENT:  Negative.   Eyes: Negative.   Respiratory: Negative.   Cardiovascular: Negative.   Gastrointestinal: Negative.   Endocrine: Negative.   Genitourinary: Negative.    Musculoskeletal: Negative.   Skin: Negative.   Neurological: Negative.   Hematological: Negative.   Psychiatric/Behavioral: Negative.     Physical Exam:  weight is 199 lb 6.4 oz (90.4 kg). His oral temperature is 97.9 F (36.6 C). His blood pressure is 131/55 (abnormal) and his pulse is 76. His respiration is 20 and oxygen saturation is 98%.   Wt Readings from Last 3 Encounters:  01/09/21 199 lb 6.4 oz (90.4 kg)  01/05/21 198 lb 8 oz (90 kg)  12/26/20 198 lb 6.4 oz (90 kg)    Physical Exam Vitals reviewed.  HENT:     Head: Normocephalic and atraumatic.     Mouth/Throat:     Mouth: Oropharynx is clear and moist.  Eyes:     Extraocular Movements: EOM normal.     Pupils: Pupils are equal, round, and reactive to light.  Cardiovascular:     Rate and Rhythm: Normal rate and regular rhythm.     Heart sounds: Normal heart sounds.  Pulmonary:     Effort: Pulmonary effort is normal.     Breath sounds: Normal breath sounds.  Abdominal:     General: Bowel sounds are normal.     Palpations: Abdomen is soft.  Musculoskeletal:        General: No tenderness, deformity or edema. Normal range of motion.     Cervical back: Normal range of motion.  Lymphadenopathy:     Cervical: No cervical adenopathy.  Skin:    General: Skin is warm and dry.     Findings: No erythema or rash.  Neurological:     Mental Status: Victor [name] is alert and oriented to person, place, and time.  Psychiatric:        Mood and Affect: Mood and affect normal.        Behavior:  Behavior normal.        Thought Content: Thought content normal.        Judgment: Judgment normal.      Lab Results  Component Value Date   WBC 2.4 (L) 01/09/2021   HGB 12.8 (L) 01/09/2021   HCT 38.5 (L)  01/09/2021   MCV 101.9 (H) 01/09/2021   PLT 56 (L) 01/09/2021     Chemistry      Component Value Date/Time   NA 143 01/09/2021 0953   NA 142 03/30/2015 1149   K 3.8 01/09/2021 0953   K 4.5 03/30/2015 1149   CL 105 01/09/2021 0953   CO2 31 01/09/2021 0953   CO2 25 03/30/2015 1149   BUN 18 01/09/2021 0953   BUN 17.1 03/30/2015 1149   CREATININE 1.04 01/09/2021 0953   CREATININE 1.2 03/30/2015 1149      Component Value Date/Time   CALCIUM 9.2 01/09/2021 0953   CALCIUM 8.7 03/30/2015 1149   ALKPHOS 56 01/09/2021 0953   AST 12 (L) 01/09/2021 0953   ALT 8 01/09/2021 0953   BILITOT 0.5 01/09/2021 0953       Impression and Plan: Victor [name] is a very nice 85 year old white male who has acute myeloid leukemia.  Victor [name] does have a adverse prognostic feature with respect to the FLT3 mutation.  I know there are targeted therapies for patients who have leukemia with this FLT3 mutation.  Again, Dr. Linus Orn is doing a fantastic job with him.  Victor [name] is responding well to the Vidaza and the venetoclax.  There is no indication that we have to put him on one of the new targeted therapy.  Victor [name] has lab work done twice a week.  We will continue him on this.  Victor [name] will get an extra week off from treatment.  Victor [name] will start treatment on March 21.  We we will get the Port-A-Cath placed.  The PICC line will come out.  This will definitely make his life better.  I will plan to get back to see [place] in a couple weeks when Victor [name] starts his fourth cycle of treatment.   Volanda Napoleon, MD 3/9/20226:56 AM

## 2021-01-11 NOTE — Telephone Encounter (Signed)
I called and LMVM for patient regarding lab & port flush appointments that were added back to his schedule.  Patient requested this lab appt.  Dr [name] first stated that appt needed to be cancelled.  However patient called requesting this appt be added back on due to him having his port placed on Monday 3/14.  Dr [name] gave the verbal OK to add appointments back on.

## 2021-01-12 ENCOUNTER — Other Ambulatory Visit: Payer: Self-pay

## 2021-01-12 ENCOUNTER — Inpatient Hospital Stay: Payer: Medicare Other

## 2021-01-12 ENCOUNTER — Other Ambulatory Visit: Payer: Self-pay | Admitting: *Deleted

## 2021-01-12 VITALS — BP 134/67 | HR 72 | Temp 98.6°F | Resp 18

## 2021-01-12 DIAGNOSIS — C9300 Acute monoblastic/monocytic leukemia, not having achieved remission: Secondary | ICD-10-CM

## 2021-01-12 DIAGNOSIS — C9200 Acute myeloblastic leukemia, not having achieved remission: Secondary | ICD-10-CM | POA: Diagnosis not present

## 2021-01-12 DIAGNOSIS — Z452 Encounter for adjustment and management of vascular access device: Secondary | ICD-10-CM

## 2021-01-12 LAB — CBC WITH DIFFERENTIAL (CANCER CENTER ONLY)
Abs Immature Granulocytes: 0.03 10*3/uL (ref 0.00–0.07)
Basophils Absolute: 0 10*3/uL (ref 0.0–0.1)
Basophils Relative: 1 %
Eosinophils Absolute: 0 10*3/uL (ref 0.0–0.5)
Eosinophils Relative: 0 %
HCT: 36.1 % — ABNORMAL LOW (ref 39.0–52.0)
Hemoglobin: 12.1 g/dL — ABNORMAL LOW (ref 13.0–17.0)
Immature Granulocytes: 1 %
Lymphocytes Relative: 34 %
Lymphs Abs: 0.8 10*3/uL (ref 0.7–4.0)
MCH: 33.7 pg (ref 26.0–34.0)
MCHC: 33.5 g/dL (ref 30.0–36.0)
MCV: 100.6 fL — ABNORMAL HIGH (ref 80.0–100.0)
Monocytes Absolute: 0.5 10*3/uL (ref 0.1–1.0)
Monocytes Relative: 20 %
Neutro Abs: 1.1 10*3/uL — ABNORMAL LOW (ref 1.7–7.7)
Neutrophils Relative %: 44 %
Platelet Count: 52 10*3/uL — ABNORMAL LOW (ref 150–400)
RBC: 3.59 MIL/uL — ABNORMAL LOW (ref 4.22–5.81)
RDW: 16.2 % — ABNORMAL HIGH (ref 11.5–15.5)
WBC Count: 2.4 10*3/uL — ABNORMAL LOW (ref 4.0–10.5)
nRBC: 0.8 % — ABNORMAL HIGH (ref 0.0–0.2)

## 2021-01-12 LAB — CMP (CANCER CENTER ONLY)
ALT: 7 U/L (ref 0–44)
AST: 11 U/L — ABNORMAL LOW (ref 15–41)
Albumin: 4.3 g/dL (ref 3.5–5.0)
Alkaline Phosphatase: 52 U/L (ref 38–126)
Anion gap: 6 (ref 5–15)
BUN: 15 mg/dL (ref 8–23)
CO2: 29 mmol/L (ref 22–32)
Calcium: 9.1 mg/dL (ref 8.9–10.3)
Chloride: 105 mmol/L (ref 98–111)
Creatinine: 1.05 mg/dL (ref 0.61–1.24)
GFR, Estimated: >60 mL/min (ref >60)
Glucose, Bld: 101 mg/dL — ABNORMAL HIGH (ref 70–99)
Potassium: 3.7 mmol/L (ref 3.5–5.1)
Sodium: 140 mmol/L (ref 135–145)
Total Bilirubin: 0.6 mg/dL (ref 0.3–1.2)
Total Protein: 5.6 g/dL — ABNORMAL LOW (ref 6.5–8.1)

## 2021-01-12 LAB — SAVE SMEAR(SSMR), FOR PROVIDER SLIDE REVIEW

## 2021-01-12 MED ORDER — HEPARIN SOD (PORK) LOCK FLUSH 100 UNIT/ML IV SOLN
250.0000 [IU] | Freq: Once | INTRAVENOUS | Status: DC
  Filled 2021-01-12: qty 5

## 2021-01-12 MED ORDER — HEPARIN SOD (PORK) LOCK FLUSH 100 UNIT/ML IV SOLN
250.0000 [IU] | Freq: Once | INTRAVENOUS | Status: AC
  Administered 2021-01-12: 250 [IU]
  Filled 2021-01-12: qty 5

## 2021-01-12 MED ORDER — SODIUM CHLORIDE 0.9% FLUSH
10.0000 mL | Freq: Once | INTRAVENOUS | Status: AC
Start: 2021-01-12 — End: 2021-01-12
  Administered 2021-01-12: 10 mL
  Filled 2021-01-12: qty 10

## 2021-01-12 NOTE — Patient Instructions (Signed)
PICC Home Care Guide A peripherally inserted central catheter (PICC) is a form of IV access that allows medicines and IV fluids to be quickly distributed throughout the body. The PICC is a long, thin, flexible tube (catheter) that is inserted into a vein in the upper arm. The catheter ends in a large vein in the chest (superior vena cava, or SVC). After the PICC is inserted, a chest X-ray may be done to make sure that it is in the correct place. A PICC may be placed for different reasons, such as:  To give medicines and liquid nutrition.  To give IV fluids and blood products.  If there is trouble placing a peripheral intravenous (PIV) catheter. If taken care of properly, a PICC can remain in place for several months. Having a PICC can also allow a person to go home from the hospital sooner. Medicine and PICC care can be managed at home by a family member, caregiver, or home health care team. What are the risks? Generally, having a PICC is safe. However, problems may occur, including:  A blood clot (thrombus) forming in or at the tip of the PICC.  A blood clot forming in a vein (deep vein thrombosis) or traveling to the lung (pulmonary embolism).  Inflammation of the vein (phlebitis) in which the PICC is placed.  Infection. Central line associated blood stream infection (CLABSI) is a serious infection that often requires hospitalization.  PICC movement (malposition). The PICC tip may move from its original position due to excessive physical activity, forceful coughing, sneezing, or vomiting.  A break or cut in the PICC. It is important not to use scissors near the PICC.  Nerve or tendon irritation or injury during PICC insertion. How to take care of your PICC Preventing problems  You and any caregivers should wash your hands often with soap. Wash hands: ? Before touching the PICC line or the infusion device. ? Before changing a bandage (dressing).  Flush the PICC as told by your  health care provider. Let your health care provider know right away if the PICC is hard to flush or does not flush. Do not use force to flush the PICC.  Do not use a syringe that is less than 10 mL to flush the PICC.  Avoid blood pressure checks on the arm in which the PICC is placed.  Never pull or tug on the PICC.  Do not take the PICC out yourself. Only a trained clinical professional should remove the PICC.  Use clean and sterile supplies only. Keep the supplies in a dry place. Do not reuse needles, syringes, or any other supplies. Doing that can lead to infection.  Keep pets and children away from your PICC line.  Check the PICC insertion site every day for signs of infection. Check for: ? Leakage. ? Redness, swelling, or pain. ? Fluid or blood. ? Warmth. ? Pus or a bad smell. PICC dressing care  Keep your PICC bandage (dressing) clean and dry to prevent infection.  Do not take baths, swim, or use a hot tub until your health care provider approves. Ask your health care provider if you can take showers. You may only be allowed to take sponge baths for bathing. When you are allowed to shower: ? Ask your health care provider to teach you how to wrap the PICC line. ? Cover the PICC line with clear plastic wrap and tape to keep it dry while showering.  Follow instructions from your health care provider about   how to take care of your insertion site and dressing. Make sure you: ? Wash your hands with soap and water before you change your bandage (dressing). If soap and water are not available, use hand sanitizer. ? Change your dressing as told by your health care provider. ? Leave stitches (sutures), skin glue, or adhesive strips in place. These skin closures may need to stay in place for 2 weeks or longer. If adhesive strip edges start to loosen and curl up, you may trim the loose edges. Do not remove adhesive strips completely unless your health care provider tells you to do  that.  Change your PICC dressing if it becomes loose or wet. General instructions  Carry your PICC identification card or wear a medical alert bracelet at all times.  Keep the tube clamped at all times, unless it is being used.  Carry a smooth-edge clamp with you at all times to place on the tube if it breaks.  Do not use scissors or sharp objects near the tube.  You may bend your arm and move it freely. If your PICC is near or at the bend of your elbow, avoid activity with repeated motion at the elbow.  Avoid lifting heavy objects as told by your health care provider.  Keep all follow-up visits as told by your health care provider. This is important.   Disposal of supplies  Throw away any syringes in a disposal container that is meant for sharp items (sharps container). You can buy a sharps container from a pharmacy, or you can make one by using an empty hard plastic bottle with a cover.  Place any used dressings or infusion bags into a plastic bag. Throw that bag in the trash. Contact a health care provider if:  You have pain in your arm, ear, face, or teeth.  You have a fever or chills.  You have redness, swelling, or pain around the insertion site.  You have fluid or blood coming from the insertion site.  Your insertion site feels warm to the touch.  You have pus or a bad smell coming from the insertion site.  Your skin feels hard and raised around the insertion site. Get help right away if:  Your PICC is accidentally pulled all the way out. If this happens, cover the insertion site with a bandage or gauze dressing. Do not throw the PICC away. Your health care provider will need to check it.  Your PICC was tugged or pulled and has partially come out. Do not  push the PICC back in.  You cannot flush the PICC, it is hard to flush, or the PICC leaks around the insertion site when it is flushed.  You hear a "flushing" sound when the PICC is flushed.  You feel your  heart racing or skipping beats.  There is a hole or tear in the PICC.  You have swelling in the arm in which the PICC was inserted.  You have a red streak going up your arm from where the PICC was inserted. Summary  A peripherally inserted central catheter (PICC) is a long, thin, flexible tube (catheter) that is inserted into a vein in the upper arm.  The PICC is inserted using a sterile technique by a specially trained nurse or physician. Only a trained clinical professional should remove it.  Keep your PICC identification card with you at all times.  Avoid blood pressure checks on the arm in which the PICC is placed.  If cared for   properly, a PICC can remain in place for several months. Having a PICC can also allow a person to go home from the hospital sooner. This information is not intended to replace advice given to you by your health care provider. Make sure you discuss any questions you have with your health care provider. Document Revised: 03/02/2020 Document Reviewed: 03/02/2020 Elsevier Patient Education  2021 Elsevier Inc.  

## 2021-01-16 ENCOUNTER — Other Ambulatory Visit: Payer: Medicare Other

## 2021-01-16 ENCOUNTER — Ambulatory Visit: Payer: Medicare Other

## 2021-01-16 HISTORY — PX: IR IMAGING GUIDED PORT INSERTION: IMG5740

## 2021-01-17 ENCOUNTER — Ambulatory Visit: Payer: Medicare Other

## 2021-01-17 ENCOUNTER — Other Ambulatory Visit: Payer: Medicare Other

## 2021-01-18 ENCOUNTER — Ambulatory Visit: Payer: Medicare Other

## 2021-01-19 ENCOUNTER — Other Ambulatory Visit: Payer: Medicare Other

## 2021-01-19 ENCOUNTER — Ambulatory Visit: Payer: Medicare Other

## 2021-01-20 ENCOUNTER — Ambulatory Visit: Payer: Medicare Other

## 2021-01-23 ENCOUNTER — Inpatient Hospital Stay: Payer: Medicare Other

## 2021-01-23 ENCOUNTER — Encounter: Payer: Self-pay | Admitting: Hematology & Oncology

## 2021-01-23 ENCOUNTER — Inpatient Hospital Stay (HOSPITAL_BASED_OUTPATIENT_CLINIC_OR_DEPARTMENT_OTHER): Payer: Medicare Other | Admitting: Hematology & Oncology

## 2021-01-23 ENCOUNTER — Other Ambulatory Visit: Payer: Medicare Other

## 2021-01-23 ENCOUNTER — Other Ambulatory Visit: Payer: Self-pay

## 2021-01-23 ENCOUNTER — Telehealth: Payer: Self-pay

## 2021-01-23 DIAGNOSIS — C9500 Acute leukemia of unspecified cell type not having achieved remission: Secondary | ICD-10-CM

## 2021-01-23 DIAGNOSIS — C969 Malignant neoplasm of lymphoid, hematopoietic and related tissue, unspecified: Secondary | ICD-10-CM

## 2021-01-23 DIAGNOSIS — C9200 Acute myeloblastic leukemia, not having achieved remission: Secondary | ICD-10-CM | POA: Diagnosis not present

## 2021-01-23 LAB — RETICULOCYTES
Immature Retic Fract: 30.6 % — ABNORMAL HIGH (ref 2.3–15.9)
RBC.: 3.46 MIL/uL — ABNORMAL LOW (ref 4.22–5.81)
Retic Count, Absolute: 101 10*3/uL (ref 19.0–186.0)
Retic Ct Pct: 2.9 % (ref 0.4–3.1)

## 2021-01-23 LAB — CBC WITH DIFFERENTIAL (CANCER CENTER ONLY)
Abs Immature Granulocytes: 0.09 10*3/uL — ABNORMAL HIGH (ref 0.00–0.07)
Basophils Absolute: 0 10*3/uL (ref 0.0–0.1)
Basophils Relative: 0 %
Eosinophils Absolute: 0 10*3/uL (ref 0.0–0.5)
Eosinophils Relative: 0 %
HCT: 34.2 % — ABNORMAL LOW (ref 39.0–52.0)
Hemoglobin: 11.5 g/dL — ABNORMAL LOW (ref 13.0–17.0)
Immature Granulocytes: 2 %
Lymphocytes Relative: 21 %
Lymphs Abs: 0.8 10*3/uL (ref 0.7–4.0)
MCH: 33.4 pg (ref 26.0–34.0)
MCHC: 33.6 g/dL (ref 30.0–36.0)
MCV: 99.4 fL (ref 80.0–100.0)
Monocytes Absolute: 0.4 10*3/uL (ref 0.1–1.0)
Monocytes Relative: 10 %
Neutro Abs: 2.6 10*3/uL (ref 1.7–7.7)
Neutrophils Relative %: 67 %
Platelet Count: 35 10*3/uL — ABNORMAL LOW (ref 150–400)
RBC: 3.44 MIL/uL — ABNORMAL LOW (ref 4.22–5.81)
RDW: 16 % — ABNORMAL HIGH (ref 11.5–15.5)
WBC Count: 4 10*3/uL (ref 4.0–10.5)
nRBC: 0 % (ref 0.0–0.2)

## 2021-01-23 LAB — CMP (CANCER CENTER ONLY)
ALT: 7 U/L (ref 0–44)
AST: 11 U/L — ABNORMAL LOW (ref 15–41)
Albumin: 4 g/dL (ref 3.5–5.0)
Alkaline Phosphatase: 61 U/L (ref 38–126)
Anion gap: 8 (ref 5–15)
BUN: 18 mg/dL (ref 8–23)
CO2: 28 mmol/L (ref 22–32)
Calcium: 8.8 mg/dL — ABNORMAL LOW (ref 8.9–10.3)
Chloride: 105 mmol/L (ref 98–111)
Creatinine: 0.97 mg/dL (ref 0.61–1.24)
GFR, Estimated: >60 mL/min (ref >60)
Glucose, Bld: 134 mg/dL — ABNORMAL HIGH (ref 70–99)
Potassium: 3.4 mmol/L — ABNORMAL LOW (ref 3.5–5.1)
Sodium: 141 mmol/L (ref 135–145)
Total Bilirubin: 0.5 mg/dL (ref 0.3–1.2)
Total Protein: 5.7 g/dL — ABNORMAL LOW (ref 6.5–8.1)

## 2021-01-23 LAB — IRON AND TIBC
Iron: 89 ug/dL (ref 42–163)
Saturation Ratios: 42 % (ref 20–55)
TIBC: 214 ug/dL (ref 202–409)
UIBC: 125 ug/dL (ref 117–376)

## 2021-01-23 LAB — SAMPLE TO BLOOD BANK

## 2021-01-23 LAB — SAVE SMEAR(SSMR), FOR PROVIDER SLIDE REVIEW

## 2021-01-23 LAB — FERRITIN: Ferritin: 493 ng/mL — ABNORMAL HIGH (ref 24–336)

## 2021-01-23 MED ORDER — SODIUM CHLORIDE 0.9% FLUSH
10.0000 mL | INTRAVENOUS | Status: DC | PRN
  Administered 2021-01-23: 10 mL
  Filled 2021-01-23: qty 10

## 2021-01-23 MED ORDER — HEPARIN SOD (PORK) LOCK FLUSH 100 UNIT/ML IV SOLN
500.0000 [IU] | Freq: Once | INTRAVENOUS | Status: AC | PRN
  Administered 2021-01-23: 500 [IU]
  Filled 2021-01-23: qty 5

## 2021-01-23 MED ORDER — SODIUM CHLORIDE 0.9 % IV SOLN
Freq: Once | INTRAVENOUS | Status: AC
  Filled 2021-01-23: qty 250

## 2021-01-23 MED ORDER — PALONOSETRON HCL INJECTION 0.25 MG/5ML
INTRAVENOUS | Status: AC
  Filled 2021-01-23: qty 5

## 2021-01-23 MED ORDER — SODIUM CHLORIDE 0.9 % IV SOLN
10.0000 mg | Freq: Once | INTRAVENOUS | Status: AC
  Administered 2021-01-23: 10 mg via INTRAVENOUS
  Filled 2021-01-23: qty 10

## 2021-01-23 MED ORDER — SODIUM CHLORIDE 0.9 % IV SOLN
75.0000 mg/m2 | Freq: Once | INTRAVENOUS | Status: AC
  Administered 2021-01-23: 155 mg via INTRAVENOUS
  Filled 2021-01-23: qty 15.5

## 2021-01-23 MED ORDER — LIDOCAINE-PRILOCAINE 2.5-2.5 % EX CREA
1.0000 | TOPICAL_CREAM | CUTANEOUS | 0 refills | Status: AC | PRN
Start: 2021-01-23 — End: ?

## 2021-01-23 MED ORDER — LIDOCAINE-PRILOCAINE 2.5-2.5 % EX CREA
TOPICAL_CREAM | CUTANEOUS | Status: AC
  Filled 2021-01-23: qty 30

## 2021-01-23 MED ORDER — PALONOSETRON HCL INJECTION 0.25 MG/5ML
0.2500 mg | Freq: Once | INTRAVENOUS | Status: AC
  Administered 2021-01-23: 0.25 mg via INTRAVENOUS

## 2021-01-23 NOTE — Telephone Encounter (Signed)
Per 01/23/21 los, most appts made already, addition labs made    Victor [name]

## 2021-01-23 NOTE — Progress Notes (Signed)
Labs reviewed by MD, VO"Ok to treat despite counts" per Dr. [name]

## 2021-01-23 NOTE — Progress Notes (Signed)
Hematology and Oncology Follow Up Visit  DESTRY DAUBER [id number] 05/24/1936 85 y.o. 01/23/2021   Principle Diagnosis:   Acute myeloid leukemia - FLT3 (+)    Current Therapy:    Vidaza/Venetoclax.--  S/p cycle #3     Interim History:  Mr. Grantz is back for follow-up.  He now has a Port-A-Cath in.  There is some bruising about the Port-A-Cath.  However, this will go away.  He went to [facility] and saw Dr. Linus Orn last week.  At a party was very pleased with how everything is going.  He does want the Vidaza to only be done 5 days.  The venetoclax dose will be moved down to 200 mg a day.  This might be because of Diflucan that he gets on when he becomes neutropenic.  There is been no problems with fever.  He has had no rashes.  He has had no bleeding.  There is been no change in bowel or bladder habits.  He has had no diarrhea.  There is been no leg swelling.  Overall, his appetite is been quite good.  Is been no nausea or vomiting.  Currently, his performance status is ECOG 1.    Medications:  Current Outpatient Medications:  .  acetaminophen (TYLENOL) 500 MG tablet, Take 1,000 mg by mouth every 6 (six) hours as needed., Disp: , Rfl:  .  doxazosin (CARDURA) 2 MG tablet, Take 1 tablet (2 mg total) by mouth at bedtime., Disp: 90 tablet, Rfl: 3 .  fluticasone (FLONASE) 50 MCG/ACT nasal spray, Place 2 sprays into the nose 2 (two) times daily as needed., Disp: , Rfl:  .  HYDROcodone-acetaminophen (NORCO) 5-325 MG tablet, Take 1 tablet by mouth every 6 (six) hours as needed for moderate pain., Disp: 40 tablet, Rfl: 0 .  ibuprofen (ADVIL) 200 MG tablet, Take 400 mg by mouth every 6 (six) hours as needed., Disp: , Rfl:  .  levothyroxine (SYNTHROID) 175 MCG tablet, Take 175 mcg by mouth daily., Disp: , Rfl:  .  Loratadine (CLARITIN PO), Take 10 mg by mouth at bedtime. Allertec, Disp: , Rfl:  .  Melatonin 5 MG CHEW, Chew 1 tablet by mouth daily as needed. , Disp: , Rfl:  .  omeprazole  (PRILOSEC) 20 MG capsule, Take 1 capsule (20 mg total) by mouth 2 (two) times daily before a meal., Disp: 180 capsule, Rfl: 3 .  potassium chloride (MICRO-K) 10 MEQ CR capsule, Take 10 mEq by mouth in the morning and at bedtime., Disp: , Rfl:  .  propranolol ER (INDERAL LA) 80 MG 24 hr capsule, Take 1 capsule (80 mg total) by mouth daily., Disp: 90 capsule, Rfl: 3 .  simvastatin (ZOCOR) 40 MG tablet, Take 1 tablet (40 mg total) by mouth at bedtime., Disp: 90 tablet, Rfl: 3 .  venetoclax 100 MG TABS, Take 200 mg by mouth daily., Disp: , Rfl:  .  acyclovir (ZOVIRAX) 400 MG tablet, Take 1 tablet (400 mg total) by mouth 2 (two) times daily as needed. (Patient not taking: No sig reported), Disp: 60 tablet, Rfl: 2 .  amoxicillin (AMOXIL) 500 MG capsule, Take 4 capsules by mouth one hour prior to dental procedure for prophylaxis given indwelling catheter. (Patient not taking: No sig reported), Disp: , Rfl:  .  fluconazole (DIFLUCAN) 200 MG tablet, Take 200 mg by mouth daily. (Patient not taking: Reported on 01/23/2021), Disp: , Rfl:  .  HEMP OIL-VANILLYL BUTYL ETHER EX, Apply topically daily. (Patient not taking: Reported on  01/23/2021), Disp: , Rfl:  .  hydrALAZINE (APRESOLINE) 25 MG tablet, Take 1 tablet (25 mg total) by mouth 3 (three) times daily as needed. Take if standing BP >140/80 mm Hg (Patient not taking: No sig reported), Disp: 90 tablet, Rfl: 2 .  levofloxacin (LEVAQUIN) 500 MG tablet, Take 500 mg by mouth daily. (Patient not taking: Reported on 01/23/2021), Disp: , Rfl:  No current facility-administered medications for this visit.  Facility-Administered Medications Ordered in Other Visits:  .  sodium chloride flush (NS) 0.9 % injection 10 mL, 10 mL, Intracatheter, PRN, Ladell Pier, MD, 10 mL at 11/03/20 1625  Allergies: No Known Allergies  Past Medical History, Surgical history, Social history, and Family History were reviewed and updated.  Review of Systems: Review of Systems   Constitutional: Negative.   HENT:  Negative.   Eyes: Negative.   Respiratory: Negative.   Cardiovascular: Negative.   Gastrointestinal: Negative.   Endocrine: Negative.   Genitourinary: Negative.    Musculoskeletal: Negative.   Skin: Negative.   Neurological: Negative.   Hematological: Negative.   Psychiatric/Behavioral: Negative.     Physical Exam:  vitals were not taken for this visit.   Wt Readings from Last 3 Encounters:  01/23/21 195 lb (88.5 kg)  01/09/21 199 lb 6.4 oz (90.4 kg)  01/05/21 198 lb 8 oz (90 kg)    Physical Exam Vitals reviewed.  HENT:     Head: Normocephalic and atraumatic.  Eyes:     Pupils: Pupils are equal, round, and reactive to light.  Cardiovascular:     Rate and Rhythm: Normal rate and regular rhythm.     Heart sounds: Normal heart sounds.  Pulmonary:     Effort: Pulmonary effort is normal.     Breath sounds: Normal breath sounds.  Abdominal:     General: Bowel sounds are normal.     Palpations: Abdomen is soft.  Musculoskeletal:        General: No tenderness or deformity. Normal range of motion.     Cervical back: Normal range of motion.  Lymphadenopathy:     Cervical: No cervical adenopathy.  Skin:    General: Skin is warm and dry.     Findings: No erythema or rash.  Neurological:     Mental Status: He is alert and oriented to person, place, and time.  Psychiatric:        Behavior: Behavior normal.        Thought Content: Thought content normal.        Judgment: Judgment normal.      Lab Results  Component Value Date   WBC 4.0 01/23/2021   HGB 11.5 (L) 01/23/2021   HCT 34.2 (L) 01/23/2021   MCV 99.4 01/23/2021   PLT 35 (L) 01/23/2021     Chemistry      Component Value Date/Time   NA 141 01/23/2021 0932   NA 142 03/30/2015 1149   K 3.4 (L) 01/23/2021 0932   K 4.5 03/30/2015 1149   CL 105 01/23/2021 0932   CO2 28 01/23/2021 0932   CO2 25 03/30/2015 1149   BUN 18 01/23/2021 0932   BUN 17.1 03/30/2015 1149    CREATININE 0.97 01/23/2021 0932   CREATININE 1.2 03/30/2015 1149      Component Value Date/Time   CALCIUM 8.8 (L) 01/23/2021 0932   CALCIUM 8.7 03/30/2015 1149   ALKPHOS 61 01/23/2021 0932   AST 11 (L) 01/23/2021 0932   ALT 7 01/23/2021 0932   BILITOT 0.5 01/23/2021  0932       Impression and Plan: Mr. Gander is a very nice 85 year old white male who has acute myeloid leukemia.  He does have a adverse prognostic feature with respect to the FLT3 mutation.  I know there are targeted therapies for patients who have leukemia with this FLT3 mutation.  Again, Dr. Linus Orn is doing a fantastic job with him.  He is responding well to the Vidaza and the venetoclax.  There is no indication that we have to put him on one of the new targeted therapy.  He has lab work done twice a week.  We will continue him on this.  He will start his fourth cycle of treatment.  Again, his blood counts are doing quite well.  I will get his blood smear.  I really do not see any leukemic cells.  I know that the Port-A-Cath will make his life better.  I will plan to see him back in another 4 weeks.  Volanda Napoleon, MD 3/21/202210:19 AM

## 2021-01-23 NOTE — Patient Instructions (Signed)
Azacitidine suspension for injection (subcutaneous use) What is this medicine? AZACITIDINE (ay za SITE i deen) is a chemotherapy drug. This medicine reduces the growth of cancer cells and can suppress the immune system. It is used for treating myelodysplastic syndrome or some types of leukemia. This medicine may be used for other purposes; ask your health care provider or pharmacist if you have questions. COMMON BRAND NAME(S): Vidaza What should I tell my health care provider before I take this medicine? They need to know if you have any of these conditions:  kidney disease  liver disease  liver tumors  an unusual or allergic reaction to azacitidine, mannitol, other medicines, foods, dyes, or preservatives  pregnant or trying to get pregnant  breast-feeding How should I use this medicine? This medicine is for injection under the skin. It is administered in a hospital or clinic by a specially trained health care professional. Talk to your pediatrician regarding the use of this medicine in children. While this drug may be prescribed for selected conditions, precautions do apply. Overdosage: If you think you have taken too much of this medicine contact a poison control center or emergency room at once. NOTE: This medicine is only for you. Do not share this medicine with others. What if I miss a dose? It is important not to miss your dose. Call your doctor or health care professional if you are unable to keep an appointment. What may interact with this medicine? Interactions have not been studied. Give your health care provider a list of all the medicines, herbs, non-prescription drugs, or dietary supplements you use. Also tell them if you smoke, drink alcohol, or use illegal drugs. Some items may interact with your medicine. This list may not describe all possible interactions. Give your health care provider a list of all the medicines, herbs, non-prescription drugs, or dietary supplements  you use. Also tell them if you smoke, drink alcohol, or use illegal drugs. Some items may interact with your medicine. What should I watch for while using this medicine? Visit your doctor for checks on your progress. This drug may make you feel generally unwell. This is not uncommon, as chemotherapy can affect healthy cells as well as cancer cells. Report any side effects. Continue your course of treatment even though you feel ill unless your doctor tells you to stop. In some cases, you may be given additional medicines to help with side effects. Follow all directions for their use. Call your doctor or health care professional for advice if you get a fever, chills or sore throat, or other symptoms of a cold or flu. Do not treat yourself. This drug decreases your body's ability to fight infections. Try to avoid being around people who are sick. This medicine may increase your risk to bruise or bleed. Call your doctor or health care professional if you notice any unusual bleeding. You may need blood work done while you are taking this medicine. Do not become pregnant while taking this medicine and for 6 months after the last dose. Women should inform their doctor if they wish to become pregnant or think they might be pregnant. Men should not father a child while taking this medicine and for 3 months after the last dose. There is a potential for serious side effects to an unborn child. Talk to your health care professional or pharmacist for more information. Do not breast-feed an infant while taking this medicine and for 1 week after the last dose. This medicine may interfere with   the ability to have a child. Talk with your doctor or health care professional if you are concerned about your fertility. What side effects may I notice from receiving this medicine? Side effects that you should report to your doctor or health care professional as soon as possible:  allergic reactions like skin rash, itching or  hives, swelling of the face, lips, or tongue  low blood counts - this medicine may decrease the number of white blood cells, red blood cells and platelets. You may be at increased risk for infections and bleeding.  signs of infection - fever or chills, cough, sore throat, pain passing urine  signs of decreased platelets or bleeding - bruising, pinpoint red spots on the skin, black, tarry stools, blood in the urine  signs of decreased red blood cells - unusually weak or tired, fainting spells, lightheadedness  signs and symptoms of kidney injury like trouble passing urine or change in the amount of urine  signs and symptoms of liver injury like dark yellow or brown urine; general ill feeling or flu-like symptoms; light-colored stools; loss of appetite; nausea; right upper belly pain; unusually weak or tired; yellowing of the eyes or skin Side effects that usually do not require medical attention (report to your doctor or health care professional if they continue or are bothersome):  constipation  diarrhea  nausea, vomiting  pain or redness at the injection site  unusually weak or tired This list may not describe all possible side effects. Call your doctor for medical advice about side effects. You may report side effects to FDA at 1-800-FDA-1088. Where should I keep my medicine? This drug is given in a hospital or clinic and will not be stored at home. NOTE: This sheet is a summary. It may not cover all possible information. If you have questions about this medicine, talk to your doctor, pharmacist, or health care provider.  2021 Elsevier/Gold Standard (2016-11-20 14:37:51)  

## 2021-01-24 ENCOUNTER — Inpatient Hospital Stay: Payer: Medicare Other

## 2021-01-24 ENCOUNTER — Ambulatory Visit: Payer: Medicare Other | Admitting: Family Medicine

## 2021-01-24 ENCOUNTER — Other Ambulatory Visit: Payer: Self-pay

## 2021-01-24 VITALS — BP 145/59 | HR 81 | Temp 98.0°F

## 2021-01-24 DIAGNOSIS — C9500 Acute leukemia of unspecified cell type not having achieved remission: Secondary | ICD-10-CM

## 2021-01-24 DIAGNOSIS — C9200 Acute myeloblastic leukemia, not having achieved remission: Secondary | ICD-10-CM | POA: Diagnosis not present

## 2021-01-24 MED ORDER — SODIUM CHLORIDE 0.9 % IV SOLN
10.0000 mg | Freq: Once | INTRAVENOUS | Status: AC
  Administered 2021-01-24: 10 mg via INTRAVENOUS
  Filled 2021-01-24: qty 10

## 2021-01-24 MED ORDER — HEPARIN SOD (PORK) LOCK FLUSH 100 UNIT/ML IV SOLN
500.0000 [IU] | Freq: Once | INTRAVENOUS | Status: AC | PRN
  Administered 2021-01-24: 500 [IU]
  Filled 2021-01-24: qty 5

## 2021-01-24 MED ORDER — SODIUM CHLORIDE 0.9 % IV SOLN
Freq: Once | INTRAVENOUS | Status: AC
  Filled 2021-01-24: qty 250

## 2021-01-24 MED ORDER — SODIUM CHLORIDE 0.9% FLUSH
10.0000 mL | INTRAVENOUS | Status: DC | PRN
  Administered 2021-01-24: 10 mL
  Filled 2021-01-24: qty 10

## 2021-01-24 MED ORDER — SODIUM CHLORIDE 0.9 % IV SOLN
75.0000 mg/m2 | Freq: Once | INTRAVENOUS | Status: AC
  Administered 2021-01-24: 155 mg via INTRAVENOUS
  Filled 2021-01-24: qty 15.5

## 2021-01-24 NOTE — Patient Instructions (Signed)
Azacitidine suspension for injection (subcutaneous use) What is this medicine? AZACITIDINE (ay za SITE i deen) is a chemotherapy drug. This medicine reduces the growth of cancer cells and can suppress the immune system. It is used for treating myelodysplastic syndrome or some types of leukemia. This medicine may be used for other purposes; ask your health care provider or pharmacist if you have questions. COMMON BRAND NAME(S): Vidaza What should I tell my health care provider before I take this medicine? They need to know if you have any of these conditions:  kidney disease  liver disease  liver tumors  an unusual or allergic reaction to azacitidine, mannitol, other medicines, foods, dyes, or preservatives  pregnant or trying to get pregnant  breast-feeding How should I use this medicine? This medicine is for injection under the skin. It is administered in a hospital or clinic by a specially trained health care professional. Talk to your pediatrician regarding the use of this medicine in children. While this drug may be prescribed for selected conditions, precautions do apply. Overdosage: If you think you have taken too much of this medicine contact a poison control center or emergency room at once. NOTE: This medicine is only for you. Do not share this medicine with others. What if I miss a dose? It is important not to miss your dose. Call your doctor or health care professional if you are unable to keep an appointment. What may interact with this medicine? Interactions have not been studied. Give your health care provider a list of all the medicines, herbs, non-prescription drugs, or dietary supplements you use. Also tell them if you smoke, drink alcohol, or use illegal drugs. Some items may interact with your medicine. This list may not describe all possible interactions. Give your health care provider a list of all the medicines, herbs, non-prescription drugs, or dietary supplements  you use. Also tell them if you smoke, drink alcohol, or use illegal drugs. Some items may interact with your medicine. What should I watch for while using this medicine? Visit your doctor for checks on your progress. This drug may make you feel generally unwell. This is not uncommon, as chemotherapy can affect healthy cells as well as cancer cells. Report any side effects. Continue your course of treatment even though you feel ill unless your doctor tells you to stop. In some cases, you may be given additional medicines to help with side effects. Follow all directions for their use. Call your doctor or health care professional for advice if you get a fever, chills or sore throat, or other symptoms of a cold or flu. Do not treat yourself. This drug decreases your body's ability to fight infections. Try to avoid being around people who are sick. This medicine may increase your risk to bruise or bleed. Call your doctor or health care professional if you notice any unusual bleeding. You may need blood work done while you are taking this medicine. Do not become pregnant while taking this medicine and for 6 months after the last dose. Women should inform their doctor if they wish to become pregnant or think they might be pregnant. Men should not father a child while taking this medicine and for 3 months after the last dose. There is a potential for serious side effects to an unborn child. Talk to your health care professional or pharmacist for more information. Do not breast-feed an infant while taking this medicine and for 1 week after the last dose. This medicine may interfere with   the ability to have a child. Talk with your doctor or health care professional if you are concerned about your fertility. What side effects may I notice from receiving this medicine? Side effects that you should report to your doctor or health care professional as soon as possible:  allergic reactions like skin rash, itching or  hives, swelling of the face, lips, or tongue  low blood counts - this medicine may decrease the number of white blood cells, red blood cells and platelets. You may be at increased risk for infections and bleeding.  signs of infection - fever or chills, cough, sore throat, pain passing urine  signs of decreased platelets or bleeding - bruising, pinpoint red spots on the skin, black, tarry stools, blood in the urine  signs of decreased red blood cells - unusually weak or tired, fainting spells, lightheadedness  signs and symptoms of kidney injury like trouble passing urine or change in the amount of urine  signs and symptoms of liver injury like dark yellow or brown urine; general ill feeling or flu-like symptoms; light-colored stools; loss of appetite; nausea; right upper belly pain; unusually weak or tired; yellowing of the eyes or skin Side effects that usually do not require medical attention (report to your doctor or health care professional if they continue or are bothersome):  constipation  diarrhea  nausea, vomiting  pain or redness at the injection site  unusually weak or tired This list may not describe all possible side effects. Call your doctor for medical advice about side effects. You may report side effects to FDA at 1-800-FDA-1088. Where should I keep my medicine? This drug is given in a hospital or clinic and will not be stored at home. NOTE: This sheet is a summary. It may not cover all possible information. If you have questions about this medicine, talk to your doctor, pharmacist, or health care provider.  2021 Elsevier/Gold Standard (2016-11-20 14:37:51)  

## 2021-01-25 ENCOUNTER — Inpatient Hospital Stay: Payer: Medicare Other

## 2021-01-25 ENCOUNTER — Other Ambulatory Visit: Payer: Self-pay

## 2021-01-25 VITALS — BP 153/60 | HR 79 | Temp 97.7°F | Resp 20

## 2021-01-25 DIAGNOSIS — C9500 Acute leukemia of unspecified cell type not having achieved remission: Secondary | ICD-10-CM

## 2021-01-25 DIAGNOSIS — C9200 Acute myeloblastic leukemia, not having achieved remission: Secondary | ICD-10-CM | POA: Diagnosis not present

## 2021-01-25 MED ORDER — SODIUM CHLORIDE 0.9 % IV SOLN
Freq: Once | INTRAVENOUS | Status: AC
  Filled 2021-01-25: qty 250

## 2021-01-25 MED ORDER — SODIUM CHLORIDE 0.9 % IV SOLN
75.0000 mg/m2 | Freq: Once | INTRAVENOUS | Status: AC
  Administered 2021-01-25: 155 mg via INTRAVENOUS
  Filled 2021-01-25: qty 15.5

## 2021-01-25 MED ORDER — SODIUM CHLORIDE 0.9 % IV SOLN
10.0000 mg | Freq: Once | INTRAVENOUS | Status: AC
  Administered 2021-01-25: 10 mg via INTRAVENOUS
  Filled 2021-01-25: qty 10

## 2021-01-25 MED ORDER — PALONOSETRON HCL INJECTION 0.25 MG/5ML
INTRAVENOUS | Status: AC
  Filled 2021-01-25: qty 5

## 2021-01-25 MED ORDER — SODIUM CHLORIDE 0.9% FLUSH
10.0000 mL | INTRAVENOUS | Status: DC | PRN
  Administered 2021-01-25: 10 mL
  Filled 2021-01-25: qty 10

## 2021-01-25 MED ORDER — PALONOSETRON HCL INJECTION 0.25 MG/5ML
0.2500 mg | Freq: Once | INTRAVENOUS | Status: AC
  Administered 2021-01-25: 0.25 mg via INTRAVENOUS

## 2021-01-25 MED ORDER — HEPARIN SOD (PORK) LOCK FLUSH 100 UNIT/ML IV SOLN
500.0000 [IU] | Freq: Once | INTRAVENOUS | Status: AC | PRN
  Administered 2021-01-25: 500 [IU]
  Filled 2021-01-25: qty 5

## 2021-01-25 NOTE — Patient Instructions (Signed)
Palonosetron Injection What is this medicine? PALONOSETRON (pal oh NOE se tron) is used to prevent nausea and vomiting caused by chemotherapy. It also helps prevent delayed nausea and vomiting that may occur a few days after your treatment. This medicine may be used for other purposes; ask your health care provider or pharmacist if you have questions. COMMON BRAND NAME(S): Aloxi What should I tell my health care provider before I take this medicine? They need to know if you have any of these conditions:  an unusual or allergic reaction to palonosetron, dolasetron, granisetron, ondansetron, other medicines, foods, dyes, or preservatives  pregnant or trying to get pregnant  breast-feeding How should I use this medicine? This medicine is for infusion into a vein. It is given by a health care professional in a hospital or clinic setting. Talk to your pediatrician regarding the use of this medicine in children. While this drug may be prescribed for children as young as 1 month for selected conditions, precautions do apply. Overdosage: If you think you have taken too much of this medicine contact a poison control center or emergency room at once. NOTE: This medicine is only for you. Do not share this medicine with others. What if I miss a dose? This does not apply. What may interact with this medicine?  certain medicines for depression, anxiety, or psychotic disturbances  fentanyl  linezolid  MAOIs like Carbex, Eldepryl, Marplan, Nardil, and Parnate  methylene blue (injected into a vein)  tramadol This list may not describe all possible interactions. Give your health care provider a list of all the medicines, herbs, non-prescription drugs, or dietary supplements you use. Also tell them if you smoke, drink alcohol, or use illegal drugs. Some items may interact with your medicine. What should I watch for while using this medicine? Your condition will be monitored carefully while you are  receiving this medicine. What side effects may I notice from receiving this medicine? Side effects that you should report to your doctor or health care professional as soon as possible:  allergic reactions like skin rash, itching or hives, swelling of the face, lips, or tongue  breathing problems  confusion  dizziness  fast, irregular heartbeat  fever and chills  loss of balance or coordination  seizures  sweating  swelling of the hands and feet  tremors  unusually weak or tired Side effects that usually do not require medical attention (report to your doctor or health care professional if they continue or are bothersome):  constipation or diarrhea  headache This list may not describe all possible side effects. Call your doctor for medical advice about side effects. You may report side effects to FDA at 1-800-FDA-1088. Where should I keep my medicine? This drug is given in a hospital or clinic and will not be stored at home. NOTE: This sheet is a summary. It may not cover all possible information. If you have questions about this medicine, talk to your doctor, pharmacist, or health care provider.  2021 Elsevier/Gold Standard (2013-08-28 10:38:36) Dexamethasone injection What is this medicine? DEXAMETHASONE (dex a METH a sone) is a corticosteroid. It is used to treat inflammation of the skin, joints, lungs, and other organs. Common conditions treated include asthma, allergies, and arthritis. It is also used for other conditions, like blood disorders and diseases of the adrenal glands. This medicine may be used for other purposes; ask your health care provider or pharmacist if you have questions. COMMON BRAND NAME(S): Decadron, DoubleDex, ReadySharp Dexamethasone, Simplist Dexamethasone, Solurex What  should I tell my health care provider before I take this medicine? They need to know if you have any of these conditions:  Cushing's syndrome  diabetes  glaucoma  heart  disease  high blood pressure  infection like herpes, measles, tuberculosis, or chickenpox  kidney disease  liver disease  mental illness  myasthenia gravis  osteoporosis  previous heart attack  seizures  stomach or intestine problems  thyroid disease  an unusual or allergic reaction to dexamethasone, corticosteroids, other medicines, lactose, foods, dyes, or preservatives  pregnant or trying to get pregnant  breast-feeding How should I use this medicine? This medicine is for injection into a muscle, joint, lesion, soft tissue, or vein. It is given by a health care professional in a hospital or clinic setting. Talk to your pediatrician regarding the use of this medicine in children. Special care may be needed. Overdosage: If you think you have taken too much of this medicine contact a poison control center or emergency room at once. NOTE: This medicine is only for you. Do not share this medicine with others. What if I miss a dose? This may not apply. If you are having a series of injections over a prolonged period, try not to miss an appointment. Call your doctor or health care professional to reschedule if you are unable to keep an appointment. What may interact with this medicine? Do not take this medicine with any of the following medications:  live virus vaccines This medicine may also interact with the following medications:  aminoglutethimide  amphotericin B  aspirin and aspirin-like medicines  certain antibiotics like erythromycin, clarithromycin, and troleandomycin  certain antivirals for HIV or hepatitis  certain medicines for seizures like carbamazepine, phenobarbital, phenytoin  certain medicines to treat myasthenia gravis  cholestyramine  cyclosporine  digoxin  diuretics  ephedrine  male hormones, like estrogen or progestins and birth control pills  insulin or other medicines for diabetes  isoniazid  ketoconazole  medicines that  relax muscles for surgery  mifepristone  NSAIDs, medicines for pain and inflammation, like ibuprofen or naproxen  rifampin  skin tests for allergies  thalidomide  vaccines  warfarin This list may not describe all possible interactions. Give your health care provider a list of all the medicines, herbs, non-prescription drugs, or dietary supplements you use. Also tell them if you smoke, drink alcohol, or use illegal drugs. Some items may interact with your medicine. What should I watch for while using this medicine? Visit your health care professional for regular checks on your progress. Tell your health care professional if your symptoms do not start to get better or if they get worse. Your condition will be monitored carefully while you are receiving this medicine. Wear a medical ID bracelet or chain. Carry a card that describes your disease and details of your medicine and dosage times. This medicine may increase your risk of getting an infection. Call your health care professional for advice if you get a fever, chills, or sore throat, or other symptoms of a cold or flu. Do not treat yourself. Try to avoid being around people who are sick. Call your health care professional if you are around anyone with measles, chickenpox, or if you develop sores or blisters that do not heal properly. If you are going to need surgery or other procedures, tell your doctor or health care professional that you have taken this medicine within the last 12 months. Ask your doctor or health care professional about your diet. You may need  to lower the amount of salt you eat. This medicine may increase blood sugar. Ask your healthcare provider if changes in diet or medicines are needed if you have diabetes. What side effects may I notice from receiving this medicine? Side effects that you should report to your doctor or health care professional as soon as possible:  allergic reactions like skin rash, itching or  hives, swelling of the face, lips, or tongue  bloody or black, tarry stools  changes in emotions or moods  changes in vision  confusion, excitement, restlessness  depressed mood  eye pain  hallucinations  muscle weakness  severe or sudden stomach or belly pain  signs and symptoms of high blood sugar such as being more thirsty or hungry or having to urinate more than normal. You may also feel very tired or have blurry vision.  signs and symptoms of infection like fever; chills; cough; sore throat; pain or trouble passing urine  swelling of ankles, feet  unusual bruising or bleeding  wounds that do not heal Side effects that usually do not require medical attention (report to your doctor or health care professional if they continue or are bothersome):  increased appetite  increased growth of face or body hair  headache  nausea, vomiting  pain, redness, or irritation at site where injected  skin problems, acne, thin and shiny skin  trouble sleeping  weight gain This list may not describe all possible side effects. Call your doctor for medical advice about side effects. You may report side effects to FDA at 1-800-FDA-1088. Where should I keep my medicine? This medicine is given in a hospital or clinic and will not be stored at home. NOTE: This sheet is a summary. It may not cover all possible information. If you have questions about this medicine, talk to your doctor, pharmacist, or health care provider.  2021 Elsevier/Gold Standard (2019-05-05 13:51:58) Azacitidine suspension for injection (subcutaneous use) What is this medicine? AZACITIDINE ([place]) is a chemotherapy drug. This medicine reduces the growth of cancer cells and can suppress the immune system. It is used for treating myelodysplastic syndrome or some types of leukemia. This medicine may be used for other purposes; ask your health care provider or pharmacist if you have questions. COMMON  BRAND NAME(S): Vidaza What should I tell my health care provider before I take this medicine? They need to know if you have any of these conditions:  kidney disease  liver disease  liver tumors  an unusual or allergic reaction to azacitidine, mannitol, other medicines, foods, dyes, or preservatives  pregnant or trying to get pregnant  breast-feeding How should I use this medicine? This medicine is for injection under the skin. It is administered in a hospital or clinic by a specially trained health care professional. Talk to your pediatrician regarding the use of this medicine in children. While this drug may be prescribed for selected conditions, precautions do apply. Overdosage: If you think you have taken too much of this medicine contact a poison control center or emergency room at once. NOTE: This medicine is only for you. Do not share this medicine with others. What if I miss a dose? It is important not to miss your dose. Call your doctor or health care professional if you are unable to keep an appointment. What may interact with this medicine? Interactions have not been studied. Give your health care provider a list of all the medicines, herbs, non-prescription drugs, or dietary supplements you use. Also  tell them if you smoke, drink alcohol, or use illegal drugs. Some items may interact with your medicine. This list may not describe all possible interactions. Give your health care provider a list of all the medicines, herbs, non-prescription drugs, or dietary supplements you use. Also tell them if you smoke, drink alcohol, or use illegal drugs. Some items may interact with your medicine. What should I watch for while using this medicine? Visit your doctor for checks on your progress. This drug may make you feel generally unwell. This is not uncommon, as chemotherapy can affect healthy cells as well as cancer cells. Report any side effects. Continue your course of treatment even  though you feel ill unless your doctor tells you to stop. In some cases, you may be given additional medicines to help with side effects. Follow all directions for their use. Call your doctor or health care professional for advice if you get a fever, chills or sore throat, or other symptoms of a cold or flu. Do not treat yourself. This drug decreases your body's ability to fight infections. Try to avoid being around people who are sick. This medicine may increase your risk to bruise or bleed. Call your doctor or health care professional if you notice any unusual bleeding. You may need blood work done while you are taking this medicine. Do not become pregnant while taking this medicine and for 6 months after the last dose. Women should inform their doctor if they wish to become pregnant or think they might be pregnant. Men should not father a child while taking this medicine and for 3 months after the last dose. There is a potential for serious side effects to an unborn child. Talk to your health care professional or pharmacist for more information. Do not breast-feed an infant while taking this medicine and for 1 week after the last dose. This medicine may interfere with the ability to have a child. Talk with your doctor or health care professional if you are concerned about your fertility. What side effects may I notice from receiving this medicine? Side effects that you should report to your doctor or health care professional as soon as possible:  allergic reactions like skin rash, itching or hives, swelling of the face, lips, or tongue  low blood counts - this medicine may decrease the number of white blood cells, red blood cells and platelets. You may be at increased risk for infections and bleeding.  signs of infection - fever or chills, cough, sore throat, pain passing urine  signs of decreased platelets or bleeding - bruising, pinpoint red spots on the skin, black, tarry stools, blood in the  urine  signs of decreased red blood cells - unusually weak or tired, fainting spells, lightheadedness  signs and symptoms of kidney injury like trouble passing urine or change in the amount of urine  signs and symptoms of liver injury like dark yellow or brown urine; general ill feeling or flu-like symptoms; light-colored stools; loss of appetite; nausea; right upper belly pain; unusually weak or tired; yellowing of the eyes or skin Side effects that usually do not require medical attention (report to your doctor or health care professional if they continue or are bothersome):  constipation  diarrhea  nausea, vomiting  pain or redness at the injection site  unusually weak or tired This list may not describe all possible side effects. Call your doctor for medical advice about side effects. You may report side effects to FDA at 1-800-FDA-1088. Where should  I keep my medicine? This drug is given in a hospital or clinic and will not be stored at home. NOTE: This sheet is a summary. It may not cover all possible information. If you have questions about this medicine, talk to your doctor, pharmacist, or health care provider.  2021 Elsevier/Gold Standard (2016-11-20 14:37:51)

## 2021-01-26 ENCOUNTER — Inpatient Hospital Stay: Payer: Medicare Other

## 2021-01-26 VITALS — BP 132/46 | HR 71 | Temp 98.5°F | Resp 20

## 2021-01-26 DIAGNOSIS — C9500 Acute leukemia of unspecified cell type not having achieved remission: Secondary | ICD-10-CM

## 2021-01-26 DIAGNOSIS — C9200 Acute myeloblastic leukemia, not having achieved remission: Secondary | ICD-10-CM | POA: Diagnosis not present

## 2021-01-26 LAB — CBC WITH DIFFERENTIAL (CANCER CENTER ONLY)
Abs Immature Granulocytes: 0.07 10*3/uL (ref 0.00–0.07)
Basophils Absolute: 0 10*3/uL (ref 0.0–0.1)
Basophils Relative: 0 %
Eosinophils Absolute: 0 10*3/uL (ref 0.0–0.5)
Eosinophils Relative: 0 %
HCT: 33.3 % — ABNORMAL LOW (ref 39.0–52.0)
Hemoglobin: 11.3 g/dL — ABNORMAL LOW (ref 13.0–17.0)
Immature Granulocytes: 1 %
Lymphocytes Relative: 11 %
Lymphs Abs: 0.7 10*3/uL (ref 0.7–4.0)
MCH: 33.7 pg (ref 26.0–34.0)
MCHC: 33.9 g/dL (ref 30.0–36.0)
MCV: 99.4 fL (ref 80.0–100.0)
Monocytes Absolute: 0.5 10*3/uL (ref 0.1–1.0)
Monocytes Relative: 8 %
Neutro Abs: 5.2 10*3/uL (ref 1.7–7.7)
Neutrophils Relative %: 80 %
Platelet Count: 39 10*3/uL — ABNORMAL LOW (ref 150–400)
RBC: 3.35 MIL/uL — ABNORMAL LOW (ref 4.22–5.81)
RDW: 15.9 % — ABNORMAL HIGH (ref 11.5–15.5)
WBC Count: 6.4 10*3/uL (ref 4.0–10.5)
nRBC: 0.3 % — ABNORMAL HIGH (ref 0.0–0.2)

## 2021-01-26 LAB — CMP (CANCER CENTER ONLY)
ALT: 10 U/L (ref 0–44)
AST: 11 U/L — ABNORMAL LOW (ref 15–41)
Albumin: 3.7 g/dL (ref 3.5–5.0)
Alkaline Phosphatase: 49 U/L (ref 38–126)
Anion gap: 8 (ref 5–15)
BUN: 20 mg/dL (ref 8–23)
CO2: 28 mmol/L (ref 22–32)
Calcium: 8.7 mg/dL — ABNORMAL LOW (ref 8.9–10.3)
Chloride: 104 mmol/L (ref 98–111)
Creatinine: 1.01 mg/dL (ref 0.61–1.24)
GFR, Estimated: >60 mL/min (ref >60)
Glucose, Bld: 126 mg/dL — ABNORMAL HIGH (ref 70–99)
Potassium: 3.7 mmol/L (ref 3.5–5.1)
Sodium: 140 mmol/L (ref 135–145)
Total Bilirubin: 0.6 mg/dL (ref 0.3–1.2)
Total Protein: 5.4 g/dL — ABNORMAL LOW (ref 6.5–8.1)

## 2021-01-26 LAB — LACTATE DEHYDROGENASE: LDH: 172 U/L (ref 98–192)

## 2021-01-26 LAB — SAVE SMEAR(SSMR), FOR PROVIDER SLIDE REVIEW

## 2021-01-26 MED ORDER — SODIUM CHLORIDE 0.9% FLUSH
10.0000 mL | INTRAVENOUS | Status: DC | PRN
  Administered 2021-01-26: 10 mL
  Filled 2021-01-26: qty 10

## 2021-01-26 MED ORDER — SODIUM CHLORIDE 0.9 % IV SOLN
10.0000 mg | Freq: Once | INTRAVENOUS | Status: AC
  Administered 2021-01-26: 10 mg via INTRAVENOUS
  Filled 2021-01-26: qty 10

## 2021-01-26 MED ORDER — SODIUM CHLORIDE 0.9 % IV SOLN
75.0000 mg/m2 | Freq: Once | INTRAVENOUS | Status: AC
  Administered 2021-01-26: 155 mg via INTRAVENOUS
  Filled 2021-01-26: qty 15.5

## 2021-01-26 MED ORDER — SODIUM CHLORIDE 0.9 % IV SOLN
Freq: Once | INTRAVENOUS | Status: AC
Start: 2021-01-26 — End: 2021-01-26
  Filled 2021-01-26: qty 250

## 2021-01-26 MED ORDER — HEPARIN SOD (PORK) LOCK FLUSH 100 UNIT/ML IV SOLN
500.0000 [IU] | Freq: Once | INTRAVENOUS | Status: AC | PRN
  Administered 2021-01-26: 500 [IU]
  Filled 2021-01-26: qty 5

## 2021-01-26 NOTE — Patient Instructions (Signed)
Azacitidine suspension for injection (subcutaneous use) What is this medicine? AZACITIDINE (ay za SITE i deen) is a chemotherapy drug. This medicine reduces the growth of cancer cells and can suppress the immune system. It is used for treating myelodysplastic syndrome or some types of leukemia. This medicine may be used for other purposes; ask your health care provider or pharmacist if you have questions. COMMON BRAND NAME(S): Vidaza What should I tell my health care provider before I take this medicine? They need to know if you have any of these conditions:  kidney disease  liver disease  liver tumors  an unusual or allergic reaction to azacitidine, mannitol, other medicines, foods, dyes, or preservatives  pregnant or trying to get pregnant  breast-feeding How should I use this medicine? This medicine is for injection under the skin. It is administered in a hospital or clinic by a specially trained health care professional. Talk to your pediatrician regarding the use of this medicine in children. While this drug may be prescribed for selected conditions, precautions do apply. Overdosage: If you think you have taken too much of this medicine contact a poison control center or emergency room at once. NOTE: This medicine is only for you. Do not share this medicine with others. What if I miss a dose? It is important not to miss your dose. Call your doctor or health care professional if you are unable to keep an appointment. What may interact with this medicine? Interactions have not been studied. Give your health care provider a list of all the medicines, herbs, non-prescription drugs, or dietary supplements you use. Also tell them if you smoke, drink alcohol, or use illegal drugs. Some items may interact with your medicine. This list may not describe all possible interactions. Give your health care provider a list of all the medicines, herbs, non-prescription drugs, or dietary supplements  you use. Also tell them if you smoke, drink alcohol, or use illegal drugs. Some items may interact with your medicine. What should I watch for while using this medicine? Visit your doctor for checks on your progress. This drug may make you feel generally unwell. This is not uncommon, as chemotherapy can affect healthy cells as well as cancer cells. Report any side effects. Continue your course of treatment even though you feel ill unless your doctor tells you to stop. In some cases, you may be given additional medicines to help with side effects. Follow all directions for their use. Call your doctor or health care professional for advice if you get a fever, chills or sore throat, or other symptoms of a cold or flu. Do not treat yourself. This drug decreases your body's ability to fight infections. Try to avoid being around people who are sick. This medicine may increase your risk to bruise or bleed. Call your doctor or health care professional if you notice any unusual bleeding. You may need blood work done while you are taking this medicine. Do not become pregnant while taking this medicine and for 6 months after the last dose. Women should inform their doctor if they wish to become pregnant or think they might be pregnant. Men should not father a child while taking this medicine and for 3 months after the last dose. There is a potential for serious side effects to an unborn child. Talk to your health care professional or pharmacist for more information. Do not breast-feed an infant while taking this medicine and for 1 week after the last dose. This medicine may interfere with   the ability to have a child. Talk with your doctor or health care professional if you are concerned about your fertility. What side effects may I notice from receiving this medicine? Side effects that you should report to your doctor or health care professional as soon as possible:  allergic reactions like skin rash, itching or  hives, swelling of the face, lips, or tongue  low blood counts - this medicine may decrease the number of white blood cells, red blood cells and platelets. You may be at increased risk for infections and bleeding.  signs of infection - fever or chills, cough, sore throat, pain passing urine  signs of decreased platelets or bleeding - bruising, pinpoint red spots on the skin, black, tarry stools, blood in the urine  signs of decreased red blood cells - unusually weak or tired, fainting spells, lightheadedness  signs and symptoms of kidney injury like trouble passing urine or change in the amount of urine  signs and symptoms of liver injury like dark yellow or brown urine; general ill feeling or flu-like symptoms; light-colored stools; loss of appetite; nausea; right upper belly pain; unusually weak or tired; yellowing of the eyes or skin Side effects that usually do not require medical attention (report to your doctor or health care professional if they continue or are bothersome):  constipation  diarrhea  nausea, vomiting  pain or redness at the injection site  unusually weak or tired This list may not describe all possible side effects. Call your doctor for medical advice about side effects. You may report side effects to FDA at 1-800-FDA-1088. Where should I keep my medicine? This drug is given in a hospital or clinic and will not be stored at home. NOTE: This sheet is a summary. It may not cover all possible information. If you have questions about this medicine, talk to your doctor, pharmacist, or health care provider.  2021 Elsevier/Gold Standard (2016-11-20 14:37:51) Dexamethasone injection What is this medicine? DEXAMETHASONE (dex a METH a sone) is a corticosteroid. It is used to treat inflammation of the skin, joints, lungs, and other organs. Common conditions treated include asthma, allergies, and arthritis. It is also used for other conditions, like blood disorders and  diseases of the adrenal glands. This medicine may be used for other purposes; ask your health care provider or pharmacist if you have questions. COMMON BRAND NAME(S): Decadron, DoubleDex, ReadySharp Dexamethasone, Simplist Dexamethasone, Solurex What should I tell my health care provider before I take this medicine? They need to know if you have any of these conditions:  Cushing's syndrome  diabetes  glaucoma  heart disease  high blood pressure  infection like herpes, measles, tuberculosis, or chickenpox  kidney disease  liver disease  mental illness  myasthenia gravis  osteoporosis  previous heart attack  seizures  stomach or intestine problems  thyroid disease  an unusual or allergic reaction to dexamethasone, corticosteroids, other medicines, lactose, foods, dyes, or preservatives  pregnant or trying to get pregnant  breast-feeding How should I use this medicine? This medicine is for injection into a muscle, joint, lesion, soft tissue, or vein. It is given by a health care professional in a hospital or clinic setting. Talk to your pediatrician regarding the use of this medicine in children. Special care may be needed. Overdosage: If you think you have taken too much of this medicine contact a poison control center or emergency room at once. NOTE: This medicine is only for you. Do not share this medicine with others. What  if I miss a dose? This may not apply. If you are having a series of injections over a prolonged period, try not to miss an appointment. Call your doctor or health care professional to reschedule if you are unable to keep an appointment. What may interact with this medicine? Do not take this medicine with any of the following medications:  live virus vaccines This medicine may also interact with the following medications:  aminoglutethimide  amphotericin B  aspirin and aspirin-like medicines  certain antibiotics like erythromycin,  clarithromycin, and troleandomycin  certain antivirals for HIV or hepatitis  certain medicines for seizures like carbamazepine, phenobarbital, phenytoin  certain medicines to treat myasthenia gravis  cholestyramine  cyclosporine  digoxin  diuretics  ephedrine  male hormones, like estrogen or progestins and birth control pills  insulin or other medicines for diabetes  isoniazid  ketoconazole  medicines that relax muscles for surgery  mifepristone  NSAIDs, medicines for pain and inflammation, like ibuprofen or naproxen  rifampin  skin tests for allergies  thalidomide  vaccines  warfarin This list may not describe all possible interactions. Give your health care provider a list of all the medicines, herbs, non-prescription drugs, or dietary supplements you use. Also tell them if you smoke, drink alcohol, or use illegal drugs. Some items may interact with your medicine. What should I watch for while using this medicine? Visit your health care professional for regular checks on your progress. Tell your health care professional if your symptoms do not start to get better or if they get worse. Your condition will be monitored carefully while you are receiving this medicine. Wear a medical ID bracelet or chain. Carry a card that describes your disease and details of your medicine and dosage times. This medicine may increase your risk of getting an infection. Call your health care professional for advice if you get a fever, chills, or sore throat, or other symptoms of a cold or flu. Do not treat yourself. Try to avoid being around people who are sick. Call your health care professional if you are around anyone with measles, chickenpox, or if you develop sores or blisters that do not heal properly. If you are going to need surgery or other procedures, tell your doctor or health care professional that you have taken this medicine within the last 12 months. Ask your doctor or  health care professional about your diet. You may need to lower the amount of salt you eat. This medicine may increase blood sugar. Ask your healthcare provider if changes in diet or medicines are needed if you have diabetes. What side effects may I notice from receiving this medicine? Side effects that you should report to your doctor or health care professional as soon as possible:  allergic reactions like skin rash, itching or hives, swelling of the face, lips, or tongue  bloody or black, tarry stools  changes in emotions or moods  changes in vision  confusion, excitement, restlessness  depressed mood  eye pain  hallucinations  muscle weakness  severe or sudden stomach or belly pain  signs and symptoms of high blood sugar such as being more thirsty or hungry or having to urinate more than normal. You may also feel very tired or have blurry vision.  signs and symptoms of infection like fever; chills; cough; sore throat; pain or trouble passing urine  swelling of ankles, feet  unusual bruising or bleeding  wounds that do not heal Side effects that usually do not require medical attention (  report to your doctor or health care professional if they continue or are bothersome):  increased appetite  increased growth of face or body hair  headache  nausea, vomiting  pain, redness, or irritation at site where injected  skin problems, acne, thin and shiny skin  trouble sleeping  weight gain This list may not describe all possible side effects. Call your doctor for medical advice about side effects. You may report side effects to FDA at 1-800-FDA-1088. Where should I keep my medicine? This medicine is given in a hospital or clinic and will not be stored at home. NOTE: This sheet is a summary. It may not cover all possible information. If you have questions about this medicine, talk to your doctor, pharmacist, or health care provider.  2021 Elsevier/Gold Standard  (2019-05-05 13:51:58)

## 2021-01-26 NOTE — Patient Instructions (Signed)

## 2021-01-27 ENCOUNTER — Inpatient Hospital Stay: Payer: Medicare Other

## 2021-01-27 ENCOUNTER — Other Ambulatory Visit: Payer: Self-pay | Admitting: *Deleted

## 2021-01-27 ENCOUNTER — Other Ambulatory Visit: Payer: Self-pay

## 2021-01-27 VITALS — BP 117/77 | HR 80 | Temp 97.5°F | Resp 19

## 2021-01-27 DIAGNOSIS — Z452 Encounter for adjustment and management of vascular access device: Secondary | ICD-10-CM

## 2021-01-27 DIAGNOSIS — C9200 Acute myeloblastic leukemia, not having achieved remission: Secondary | ICD-10-CM | POA: Diagnosis not present

## 2021-01-27 DIAGNOSIS — C969 Malignant neoplasm of lymphoid, hematopoietic and related tissue, unspecified: Secondary | ICD-10-CM

## 2021-01-27 DIAGNOSIS — C9500 Acute leukemia of unspecified cell type not having achieved remission: Secondary | ICD-10-CM

## 2021-01-27 MED ORDER — SODIUM CHLORIDE 0.9 % IV SOLN
75.0000 mg/m2 | Freq: Once | INTRAVENOUS | Status: AC
  Administered 2021-01-27: 155 mg via INTRAVENOUS
  Filled 2021-01-27: qty 15.5

## 2021-01-27 MED ORDER — SODIUM CHLORIDE 0.9 % IV SOLN
10.0000 mg | Freq: Once | INTRAVENOUS | Status: AC
  Administered 2021-01-27: 10 mg via INTRAVENOUS
  Filled 2021-01-27: qty 10

## 2021-01-27 MED ORDER — SODIUM CHLORIDE 0.9% FLUSH
10.0000 mL | INTRAVENOUS | Status: DC | PRN
  Administered 2021-01-27: 10 mL
  Filled 2021-01-27: qty 10

## 2021-01-27 MED ORDER — HEPARIN SOD (PORK) LOCK FLUSH 100 UNIT/ML IV SOLN
500.0000 [IU] | Freq: Once | INTRAVENOUS | Status: AC | PRN
  Administered 2021-01-27: 500 [IU]
  Filled 2021-01-27: qty 5

## 2021-01-27 MED ORDER — PALONOSETRON HCL INJECTION 0.25 MG/5ML
0.2500 mg | Freq: Once | INTRAVENOUS | Status: AC
  Administered 2021-01-27: 0.25 mg via INTRAVENOUS

## 2021-01-27 MED ORDER — PALONOSETRON HCL INJECTION 0.25 MG/5ML
INTRAVENOUS | Status: AC
  Filled 2021-01-27: qty 5

## 2021-01-27 MED ORDER — SODIUM CHLORIDE 0.9 % IV SOLN
Freq: Once | INTRAVENOUS | Status: AC
Start: 2021-01-27 — End: 2021-01-27
  Filled 2021-01-27: qty 250

## 2021-01-27 MED ORDER — LIDOCAINE-PRILOCAINE 2.5-2.5 % EX CREA
TOPICAL_CREAM | CUTANEOUS | Status: AC
  Filled 2021-01-27: qty 30

## 2021-01-27 NOTE — Patient Instructions (Signed)
[facility] Discharge Instructions for Patients Receiving Chemotherapy  Today you received the following chemotherapy agents Vidaza  To help prevent nausea and vomiting after your treatment, we encourage you to take your nausea medication as prescribed by MD   If you develop nausea and vomiting that is not controlled by your nausea medication, call the clinic.   BELOW ARE SYMPTOMS THAT SHOULD BE REPORTED IMMEDIATELY:  *FEVER GREATER THAN 100.5 F  *CHILLS WITH OR WITHOUT FEVER  NAUSEA AND VOMITING THAT IS NOT CONTROLLED WITH YOUR NAUSEA MEDICATION  *UNUSUAL SHORTNESS OF BREATH  *UNUSUAL BRUISING OR BLEEDING  TENDERNESS IN MOUTH AND THROAT WITH OR WITHOUT PRESENCE OF ULCERS  *URINARY PROBLEMS  *BOWEL PROBLEMS  UNUSUAL RASH Items with * indicate a potential emergency and should be followed up as soon as possible.  Feel free to call the clinic should you have any questions or concerns. The clinic phone number is [phone].  Please show the CHEMO ALERT CARD at check-in to the Emergency Department and triage nurse.    

## 2021-01-30 ENCOUNTER — Other Ambulatory Visit: Payer: Self-pay

## 2021-01-30 ENCOUNTER — Inpatient Hospital Stay: Payer: Medicare Other

## 2021-01-30 ENCOUNTER — Ambulatory Visit: Payer: Medicare Other

## 2021-01-30 VITALS — BP 140/69 | HR 72 | Temp 97.7°F | Resp 18

## 2021-01-30 DIAGNOSIS — C9200 Acute myeloblastic leukemia, not having achieved remission: Secondary | ICD-10-CM | POA: Diagnosis not present

## 2021-01-30 DIAGNOSIS — Z452 Encounter for adjustment and management of vascular access device: Secondary | ICD-10-CM

## 2021-01-30 DIAGNOSIS — C969 Malignant neoplasm of lymphoid, hematopoietic and related tissue, unspecified: Secondary | ICD-10-CM

## 2021-01-30 DIAGNOSIS — C9500 Acute leukemia of unspecified cell type not having achieved remission: Secondary | ICD-10-CM

## 2021-01-30 LAB — CMP (CANCER CENTER ONLY)
ALT: 9 U/L (ref 0–44)
AST: 10 U/L — ABNORMAL LOW (ref 15–41)
Albumin: 3.5 g/dL (ref 3.5–5.0)
Alkaline Phosphatase: 45 U/L (ref 38–126)
Anion gap: 7 (ref 5–15)
BUN: 20 mg/dL (ref 8–23)
CO2: 29 mmol/L (ref 22–32)
Calcium: 8.5 mg/dL — ABNORMAL LOW (ref 8.9–10.3)
Chloride: 105 mmol/L (ref 98–111)
Creatinine: 1.1 mg/dL (ref 0.61–1.24)
GFR, Estimated: >60 mL/min (ref >60)
Glucose, Bld: 101 mg/dL — ABNORMAL HIGH (ref 70–99)
Potassium: 3.7 mmol/L (ref 3.5–5.1)
Sodium: 141 mmol/L (ref 135–145)
Total Bilirubin: 0.8 mg/dL (ref 0.3–1.2)
Total Protein: 5 g/dL — ABNORMAL LOW (ref 6.5–8.1)

## 2021-01-30 LAB — CBC WITH DIFFERENTIAL (CANCER CENTER ONLY)
Abs Immature Granulocytes: 0.03 10*3/uL (ref 0.00–0.07)
Basophils Absolute: 0 10*3/uL (ref 0.0–0.1)
Basophils Relative: 0 %
Eosinophils Absolute: 0 10*3/uL (ref 0.0–0.5)
Eosinophils Relative: 0 %
HCT: 34.7 % — ABNORMAL LOW (ref 39.0–52.0)
Hemoglobin: 11.8 g/dL — ABNORMAL LOW (ref 13.0–17.0)
Immature Granulocytes: 1 %
Lymphocytes Relative: 26 %
Lymphs Abs: 0.9 10*3/uL (ref 0.7–4.0)
MCH: 33.8 pg (ref 26.0–34.0)
MCHC: 34 g/dL (ref 30.0–36.0)
MCV: 99.4 fL (ref 80.0–100.0)
Monocytes Absolute: 0.2 10*3/uL (ref 0.1–1.0)
Monocytes Relative: 6 %
Neutro Abs: 2.4 10*3/uL (ref 1.7–7.7)
Neutrophils Relative %: 67 %
Platelet Count: 31 10*3/uL — ABNORMAL LOW (ref 150–400)
RBC: 3.49 MIL/uL — ABNORMAL LOW (ref 4.22–5.81)
RDW: 16 % — ABNORMAL HIGH (ref 11.5–15.5)
WBC Count: 3.6 10*3/uL — ABNORMAL LOW (ref 4.0–10.5)
nRBC: 0.6 % — ABNORMAL HIGH (ref 0.0–0.2)

## 2021-01-30 MED ORDER — SODIUM CHLORIDE 0.9% FLUSH
10.0000 mL | Freq: Once | INTRAVENOUS | Status: AC
  Administered 2021-01-30: 10 mL
  Filled 2021-01-30: qty 10

## 2021-01-30 MED ORDER — HEPARIN SOD (PORK) LOCK FLUSH 100 UNIT/ML IV SOLN
250.0000 [IU] | Freq: Once | INTRAVENOUS | Status: AC
  Administered 2021-01-30: 500 [IU]
  Filled 2021-01-30: qty 5

## 2021-01-30 NOTE — Patient Instructions (Signed)

## 2021-01-31 ENCOUNTER — Ambulatory Visit: Payer: Medicare Other

## 2021-02-01 ENCOUNTER — Other Ambulatory Visit: Payer: Self-pay | Admitting: *Deleted

## 2021-02-01 DIAGNOSIS — C9500 Acute leukemia of unspecified cell type not having achieved remission: Secondary | ICD-10-CM

## 2021-02-02 ENCOUNTER — Inpatient Hospital Stay: Payer: Medicare Other

## 2021-02-02 ENCOUNTER — Other Ambulatory Visit: Payer: Self-pay

## 2021-02-02 VITALS — BP 133/55 | HR 78 | Temp 98.6°F | Resp 18

## 2021-02-02 DIAGNOSIS — Z95828 Presence of other vascular implants and grafts: Secondary | ICD-10-CM

## 2021-02-02 DIAGNOSIS — C9500 Acute leukemia of unspecified cell type not having achieved remission: Secondary | ICD-10-CM

## 2021-02-02 DIAGNOSIS — C9200 Acute myeloblastic leukemia, not having achieved remission: Secondary | ICD-10-CM | POA: Diagnosis not present

## 2021-02-02 LAB — MAGNESIUM: Magnesium: 2 mg/dL (ref 1.7–2.4)

## 2021-02-02 LAB — CBC WITH DIFFERENTIAL (CANCER CENTER ONLY)
Abs Immature Granulocytes: 0.02 10*3/uL (ref 0.00–0.07)
Basophils Absolute: 0 10*3/uL (ref 0.0–0.1)
Basophils Relative: 0 %
Eosinophils Absolute: 0 10*3/uL (ref 0.0–0.5)
Eosinophils Relative: 0 %
HCT: 34 % — ABNORMAL LOW (ref 39.0–52.0)
Hemoglobin: 11.4 g/dL — ABNORMAL LOW (ref 13.0–17.0)
Immature Granulocytes: 1 %
Lymphocytes Relative: 28 %
Lymphs Abs: 0.8 10*3/uL (ref 0.7–4.0)
MCH: 33.7 pg (ref 26.0–34.0)
MCHC: 33.5 g/dL (ref 30.0–36.0)
MCV: 100.6 fL — ABNORMAL HIGH (ref 80.0–100.0)
Monocytes Absolute: 0.2 10*3/uL (ref 0.1–1.0)
Monocytes Relative: 6 %
Neutro Abs: 1.9 10*3/uL (ref 1.7–7.7)
Neutrophils Relative %: 65 %
Platelet Count: 22 10*3/uL — ABNORMAL LOW (ref 150–400)
RBC: 3.38 MIL/uL — ABNORMAL LOW (ref 4.22–5.81)
RDW: 16 % — ABNORMAL HIGH (ref 11.5–15.5)
WBC Count: 2.8 10*3/uL — ABNORMAL LOW (ref 4.0–10.5)
nRBC: 0 % (ref 0.0–0.2)

## 2021-02-02 LAB — COMPREHENSIVE METABOLIC PANEL
ALT: 9 U/L (ref 0–44)
AST: 11 U/L — ABNORMAL LOW (ref 15–41)
Albumin: 3.6 g/dL (ref 3.5–5.0)
Alkaline Phosphatase: 42 U/L (ref 38–126)
Anion gap: 6 (ref 5–15)
BUN: 16 mg/dL (ref 8–23)
CO2: 29 mmol/L (ref 22–32)
Calcium: 8.6 mg/dL — ABNORMAL LOW (ref 8.9–10.3)
Chloride: 107 mmol/L (ref 98–111)
Creatinine, Ser: 0.95 mg/dL (ref 0.61–1.24)
GFR, Estimated: >60 mL/min (ref >60)
Glucose, Bld: 101 mg/dL — ABNORMAL HIGH (ref 70–99)
Potassium: 4.1 mmol/L (ref 3.5–5.1)
Sodium: 142 mmol/L (ref 135–145)
Total Bilirubin: 0.9 mg/dL (ref 0.3–1.2)
Total Protein: 5.2 g/dL — ABNORMAL LOW (ref 6.5–8.1)

## 2021-02-02 MED ORDER — HEPARIN SOD (PORK) LOCK FLUSH 100 UNIT/ML IV SOLN
500.0000 [IU] | Freq: Once | INTRAVENOUS | Status: AC
  Administered 2021-02-02: 500 [IU] via INTRAVENOUS
  Filled 2021-02-02: qty 5

## 2021-02-02 MED ORDER — SODIUM CHLORIDE 0.9% FLUSH
10.0000 mL | INTRAVENOUS | Status: DC | PRN
  Administered 2021-02-02: 10 mL via INTRAVENOUS
  Filled 2021-02-02: qty 10

## 2021-02-02 NOTE — Progress Notes (Signed)
Dr. [name] notified of today's CBC and CMET results.  No new orders received at this time.

## 2021-02-03 ENCOUNTER — Other Ambulatory Visit: Payer: Self-pay

## 2021-02-03 DIAGNOSIS — C9500 Acute leukemia of unspecified cell type not having achieved remission: Secondary | ICD-10-CM

## 2021-02-06 ENCOUNTER — Other Ambulatory Visit: Payer: Self-pay

## 2021-02-06 ENCOUNTER — Inpatient Hospital Stay: Payer: Medicare Other

## 2021-02-06 ENCOUNTER — Inpatient Hospital Stay: Payer: Medicare Other | Attending: Oncology

## 2021-02-06 VITALS — BP 127/53 | HR 79 | Temp 97.7°F | Resp 19

## 2021-02-06 DIAGNOSIS — Z79899 Other long term (current) drug therapy: Secondary | ICD-10-CM | POA: Diagnosis not present

## 2021-02-06 DIAGNOSIS — C9200 Acute myeloblastic leukemia, not having achieved remission: Secondary | ICD-10-CM | POA: Insufficient documentation

## 2021-02-06 DIAGNOSIS — D696 Thrombocytopenia, unspecified: Secondary | ICD-10-CM | POA: Insufficient documentation

## 2021-02-06 DIAGNOSIS — C969 Malignant neoplasm of lymphoid, hematopoietic and related tissue, unspecified: Secondary | ICD-10-CM | POA: Insufficient documentation

## 2021-02-06 DIAGNOSIS — Z95828 Presence of other vascular implants and grafts: Secondary | ICD-10-CM

## 2021-02-06 DIAGNOSIS — C9500 Acute leukemia of unspecified cell type not having achieved remission: Secondary | ICD-10-CM

## 2021-02-06 LAB — CBC WITH DIFFERENTIAL (CANCER CENTER ONLY)
Abs Immature Granulocytes: 0 10*3/uL (ref 0.00–0.07)
Basophils Absolute: 0 10*3/uL (ref 0.0–0.1)
Basophils Relative: 1 %
Eosinophils Absolute: 0 10*3/uL (ref 0.0–0.5)
Eosinophils Relative: 0 %
HCT: 34.4 % — ABNORMAL LOW (ref 39.0–52.0)
Hemoglobin: 11.5 g/dL — ABNORMAL LOW (ref 13.0–17.0)
Immature Granulocytes: 0 %
Lymphocytes Relative: 65 %
Lymphs Abs: 0.7 10*3/uL (ref 0.7–4.0)
MCH: 33.4 pg (ref 26.0–34.0)
MCHC: 33.4 g/dL (ref 30.0–36.0)
MCV: 100 fL (ref 80.0–100.0)
Monocytes Absolute: 0.1 10*3/uL (ref 0.1–1.0)
Monocytes Relative: 9 %
Neutro Abs: 0.3 10*3/uL — CL (ref 1.7–7.7)
Neutrophils Relative %: 25 %
Platelet Count: 18 10*3/uL — ABNORMAL LOW (ref 150–400)
RBC: 3.44 MIL/uL — ABNORMAL LOW (ref 4.22–5.81)
RDW: 16.3 % — ABNORMAL HIGH (ref 11.5–15.5)
WBC Count: 1.2 10*3/uL — ABNORMAL LOW (ref 4.0–10.5)
nRBC: 0 % (ref 0.0–0.2)

## 2021-02-06 LAB — CMP (CANCER CENTER ONLY)
ALT: 10 U/L (ref 0–44)
AST: 12 U/L — ABNORMAL LOW (ref 15–41)
Albumin: 3.9 g/dL (ref 3.5–5.0)
Alkaline Phosphatase: 50 U/L (ref 38–126)
Anion gap: 7 (ref 5–15)
BUN: 18 mg/dL (ref 8–23)
CO2: 27 mmol/L (ref 22–32)
Calcium: 9 mg/dL (ref 8.9–10.3)
Chloride: 106 mmol/L (ref 98–111)
Creatinine: 1 mg/dL (ref 0.61–1.24)
GFR, Estimated: >60 mL/min (ref >60)
Glucose, Bld: 99 mg/dL (ref 70–99)
Potassium: 3.7 mmol/L (ref 3.5–5.1)
Sodium: 140 mmol/L (ref 135–145)
Total Bilirubin: 0.8 mg/dL (ref 0.3–1.2)
Total Protein: 5.6 g/dL — ABNORMAL LOW (ref 6.5–8.1)

## 2021-02-06 MED ORDER — SODIUM CHLORIDE 0.9% FLUSH
10.0000 mL | Freq: Once | INTRAVENOUS | Status: AC
  Administered 2021-02-06: 10 mL via INTRAVENOUS
  Filled 2021-02-06: qty 10

## 2021-02-06 MED ORDER — HEPARIN SOD (PORK) LOCK FLUSH 100 UNIT/ML IV SOLN
500.0000 [IU] | Freq: Once | INTRAVENOUS | Status: AC
  Administered 2021-02-06: 500 [IU] via INTRAVENOUS
  Filled 2021-02-06: qty 5

## 2021-02-06 NOTE — Progress Notes (Signed)
Reviewed labs with patient and educated patient on neutropenic precautions. Pt aware to use good handwashing, avoid crowds, wear a mask and call with fever. Pt verbalized understanding and had no further questions.

## 2021-02-06 NOTE — Patient Instructions (Signed)

## 2021-02-06 NOTE — Progress Notes (Signed)
Azacitine will be given every 6 weeks per Dr. Alvie Heidelberg recommendation. Changing cycles to 42 days per Dr. Antonieta Pert instructions.

## 2021-02-08 ENCOUNTER — Other Ambulatory Visit: Payer: Self-pay

## 2021-02-08 DIAGNOSIS — C9500 Acute leukemia of unspecified cell type not having achieved remission: Secondary | ICD-10-CM

## 2021-02-09 ENCOUNTER — Inpatient Hospital Stay: Payer: Medicare Other

## 2021-02-09 ENCOUNTER — Telehealth: Payer: Self-pay | Admitting: *Deleted

## 2021-02-09 ENCOUNTER — Encounter: Payer: Self-pay | Admitting: Family Medicine

## 2021-02-09 ENCOUNTER — Ambulatory Visit (INDEPENDENT_AMBULATORY_CARE_PROVIDER_SITE_OTHER): Payer: Medicare Other | Admitting: Family Medicine

## 2021-02-09 ENCOUNTER — Other Ambulatory Visit: Payer: Self-pay

## 2021-02-09 DIAGNOSIS — C969 Malignant neoplasm of lymphoid, hematopoietic and related tissue, unspecified: Secondary | ICD-10-CM | POA: Diagnosis not present

## 2021-02-09 DIAGNOSIS — C9500 Acute leukemia of unspecified cell type not having achieved remission: Secondary | ICD-10-CM

## 2021-02-09 DIAGNOSIS — M533 Sacrococcygeal disorders, not elsewhere classified: Secondary | ICD-10-CM | POA: Diagnosis not present

## 2021-02-09 DIAGNOSIS — G8929 Other chronic pain: Secondary | ICD-10-CM | POA: Diagnosis not present

## 2021-02-09 DIAGNOSIS — Z95828 Presence of other vascular implants and grafts: Secondary | ICD-10-CM

## 2021-02-09 LAB — CMP (CANCER CENTER ONLY)
ALT: 8 U/L (ref 0–44)
AST: 11 U/L — ABNORMAL LOW (ref 15–41)
Albumin: 3.8 g/dL (ref 3.5–5.0)
Alkaline Phosphatase: 50 U/L (ref 38–126)
Anion gap: 6 (ref 5–15)
BUN: 17 mg/dL (ref 8–23)
CO2: 28 mmol/L (ref 22–32)
Calcium: 8.8 mg/dL — ABNORMAL LOW (ref 8.9–10.3)
Chloride: 107 mmol/L (ref 98–111)
Creatinine: 0.91 mg/dL (ref 0.61–1.24)
GFR, Estimated: >60 mL/min (ref >60)
Glucose, Bld: 115 mg/dL — ABNORMAL HIGH (ref 70–99)
Potassium: 3.6 mmol/L (ref 3.5–5.1)
Sodium: 141 mmol/L (ref 135–145)
Total Bilirubin: 0.6 mg/dL (ref 0.3–1.2)
Total Protein: 5.2 g/dL — ABNORMAL LOW (ref 6.5–8.1)

## 2021-02-09 LAB — CBC WITH DIFFERENTIAL (CANCER CENTER ONLY)
Abs Immature Granulocytes: 0.01 10*3/uL (ref 0.00–0.07)
Basophils Absolute: 0 10*3/uL (ref 0.0–0.1)
Basophils Relative: 1 %
Eosinophils Absolute: 0 10*3/uL (ref 0.0–0.5)
Eosinophils Relative: 0 %
HCT: 32.7 % — ABNORMAL LOW (ref 39.0–52.0)
Hemoglobin: 10.9 g/dL — ABNORMAL LOW (ref 13.0–17.0)
Immature Granulocytes: 1 %
Lymphocytes Relative: 78 %
Lymphs Abs: 0.7 10*3/uL (ref 0.7–4.0)
MCH: 33.5 pg (ref 26.0–34.0)
MCHC: 33.3 g/dL (ref 30.0–36.0)
MCV: 100.6 fL — ABNORMAL HIGH (ref 80.0–100.0)
Monocytes Absolute: 0.1 10*3/uL (ref 0.1–1.0)
Monocytes Relative: 7 %
Neutro Abs: 0.1 10*3/uL — CL (ref 1.7–7.7)
Neutrophils Relative %: 13 %
Platelet Count: 20 10*3/uL — ABNORMAL LOW (ref 150–400)
RBC: 3.25 MIL/uL — ABNORMAL LOW (ref 4.22–5.81)
RDW: 16.5 % — ABNORMAL HIGH (ref 11.5–15.5)
WBC Count: 0.9 10*3/uL — CL (ref 4.0–10.5)
nRBC: 0 % (ref 0.0–0.2)

## 2021-02-09 LAB — MAGNESIUM: Magnesium: 1.9 mg/dL (ref 1.7–2.4)

## 2021-02-09 MED ORDER — SODIUM CHLORIDE 0.9% FLUSH
10.0000 mL | Freq: Once | INTRAVENOUS | Status: AC
Start: 2021-02-09 — End: 2021-02-09
  Administered 2021-02-09: 10 mL via INTRAVENOUS
  Filled 2021-02-09: qty 10

## 2021-02-09 MED ORDER — HEPARIN SOD (PORK) LOCK FLUSH 100 UNIT/ML IV SOLN
500.0000 [IU] | Freq: Once | INTRAVENOUS | Status: AC
Start: 2021-02-09 — End: 2021-02-09
  Administered 2021-02-09: 500 [IU] via INTRAVENOUS
  Filled 2021-02-09: qty 5

## 2021-02-09 NOTE — Addendum Note (Signed)
Addended by: Tyler Aas A on: 02/09/2021 12:32 PM   Modules accepted: Orders

## 2021-02-09 NOTE — Telephone Encounter (Signed)
Dr. [name] notified of platelets-20, anc-0.1 and wbc-0.9.  No new orders received at this time.

## 2021-02-09 NOTE — Progress Notes (Signed)
Victor [name] - 85 y.o. male MRN [id number]  Date of birth: Sep 26, 1936  SUBJECTIVE:  Including CC & ROS.  No chief complaint on file.   Victor [name] is a 85 y.o. male that is presenting with acute worsening of his left SI joint.  He is having pain on the left posterior hip.   Review of Systems See HPI   HISTORY: Past Medical, Surgical, Social, and Family History Reviewed & Updated per EMR.   Pertinent Historical Findings include:  Past Medical History:  Diagnosis Date  . Arthritis   . Atherosclerosis   . Atherosclerotic PVD with intermittent claudication (HCC)    stent left leg  . BPH (benign prostatic hyperplasia)   . Bulging lumbar disc   . Cancer ([place]) 11/12/14   vocal cord  carcinoma in situ , radiation; thyroid cancer  . Carotid bruit   . Chronic kidney disease    chronic stage III  . COPD (chronic obstructive pulmonary disease) ([place])   . DDD (degenerative disc disease), lumbar   . Dysphonia   . Dysplasia of true vocal cord   . GERD (gastroesophageal reflux disease)   . H/O carotid atherosclerosis    b/l  . Hyperlipidemia   . Hypothyroidism   . Occasional tremors    left hand managed with propranolol  . Peripheral vascular angioplasty status with implants and grafts   . Precancerous lesion 03/05/2018   premelanoma removed from back.   . Radiation 03/10/15- 04/18/16   Larynx  . Sleep apnea    hasn't used CPAP in years  . Spondylosis of lumbosacral region   . Thyroid disease     Past Surgical History:  Procedure Laterality Date  . APPENDECTOMY    . COLONOSCOPY W/ POLYPECTOMY    . DIODE LASER APPLICATION Left 16/11/930   Procedure: DIODE LASER APPLICATION;  Surgeon: Hayden Pedro, MD;  Location: [place];  Service: Ophthalmology;  Laterality: Left;  . IR IMAGING GUIDED PORT INSERTION  01/16/2021  . MEMBRANE PEEL Left 08/18/2019   Procedure: MEMBRANE PEEL;  Surgeon: Hayden Pedro, MD;  Location: [place];  Service: Ophthalmology;  Laterality: Left;  . MOUTH  SURGERY     tooth extraction  . PARS PLANA VITRECTOMY Left 08/18/2019   PARS PLANA VITRECTOMY 27 GAUGE (Left)  . PARS PLANA VITRECTOMY 27 GAUGE Left 08/18/2019   Procedure: PARS PLANA VITRECTOMY 27 GAUGE;  Surgeon: Hayden Pedro, MD;  Location: [place];  Service: Ophthalmology;  Laterality: Left;  . PHOTOCOAGULATION WITH LASER Left 08/18/2019   Procedure: PHOTOCOAGULATION WITH LASER;  Surgeon: Hayden Pedro, MD;  Location: [place];  Service: Ophthalmology;  Laterality: Left;  . THYROID SURGERY    . TONSILLECTOMY    . vocal cord biopsy  11/12/14   squamous cell carcinoma in situ    Family History  Problem Relation Age of Onset  . Colon cancer Father   . Cancer Sister        Died of metastatic cancer  . Leukemia Neg Hx     Social History   Socioeconomic History  . Marital status: Married    Spouse name: Not on file  . Number of children: 2  . Years of education: Not on file  . Highest education level: Not on file  Occupational History  . Not on file  Tobacco Use  . Smoking status: Former Smoker    Packs/day: 2.00    Years: 51.00    Pack years: 102.00    Types:  Cigarettes    Start date: 28    Quit date: 03/05/2004    Years since quitting: 16.9  . Smokeless tobacco: Never Used  Vaping Use  . Vaping Use: Never used  Substance and Sexual Activity  . Alcohol use: Yes    Alcohol/week: 7.0 standard drinks    Types: 7 Standard drinks or equivalent per week    Comment: 1 drink of rum per day  . Drug use: No  . Sexual activity: Not on file  Other Topics Concern  . Not on file  Social History Narrative  . Not on file   Social Determinants of Health   Financial Resource Strain: Not on file  Food Insecurity: Not on file  Transportation Needs: Not on file  Physical Activity: Not on file  Stress: Not on file  Social Connections: Not on file  Intimate Partner Violence: Not on file     PHYSICAL EXAM:  VS: BP 140/66 (BP Location: Left Arm, Patient Position: Sitting,  Cuff Size: Large)   Ht 5\' 10"  (1.778 m)   Wt 197 lb (89.4 kg)   BMI 28.27 kg/m  Physical Exam Gen: NAD, alert, cooperative with exam, well-appearing   ASSESSMENT & PLAN:   Chronic left SI joint pain Acute on chronic in nature.  Having some pain similar to previous SI joint pain. -Counseled home exercise therapy and supportive care. -Can pursue injection but his platelets are currently around 20,000.  He will inform me when they have improved.

## 2021-02-09 NOTE — Patient Instructions (Signed)
Tunneled Central Venous Catheter Flushing Guide  It is important to flush your tunneled central venous catheter each time you use it, both before and after you use it. Flushing your catheter will help prevent it from clogging. What are the risks? Risks may include:  Infection.  Air getting into the catheter and bloodstream. Supplies needed:  A clean pair of gloves.  A disinfecting wipe. Use an alcohol wipe, chlorhexidine wipe, or iodine wipe as told by your health care provider.  A 10 mL syringe that has been prefilled with saline solution.  An empty 10 mL syringe, if a substance called heparin was injected into your catheter. How to flush your catheter When you flush your catheter, make sure you follow any specific instructions from your health care provider or the manufacturer. These are general guidelines. Flushing your catheter before use If there is heparin in your catheter: 1. Wash your hands with soap and water. 2. Put on gloves. 3. Scrub the injection cap for a minimum of 15 seconds with a disinfecting wipe. 4. Unclamp the catheter. 5. Attach the empty syringe to the injection cap. 6. Pull the syringe plunger back and withdraw 10 mL of blood. 7. Place the syringe into an appropriate waste container. 8. Scrub the injection cap for 15 seconds with a disinfecting wipe. 9. Attach the prefilled syringe to the injection cap. 10. Flush the catheter by pushing the plunger forward until all the liquid from the syringe is in the catheter. 11. Remove the syringe from the injection cap. 12. Clamp the catheter. If there is no heparin in your catheter: 1. Wash your hands with soap and water. 2. Put on gloves. 3. Scrub the injection cap for 15 seconds with a disinfecting wipe. 4. Unclamp the catheter. 5. Attach the prefilled syringe to the injection cap. 6. Flush the catheter by pushing the plunger forward until 5 mL of the liquid from the syringe is in the catheter. 7. Pull back on  the syringe until you see blood in the catheter. 8. If you have been asked to collect any blood, follow your health care provider's instructions. Otherwise, flush the catheter with the rest of the solution from the syringe. 9. Remove the syringe from the injection cap. 10. Clamp the catheter.   Flushing your catheter after use 1. Wash your hands with soap and water. 2. Put on gloves. 3. Scrub the injection cap for 15 seconds with a disinfecting wipe. 4. Unclamp the catheter. 5. Attach the prefilled syringe to the injection cap. 6. Flush the catheter by pushing the plunger forward until all of the liquid from the syringe is in the catheter. 7. Remove the syringe from the injection cap. 8. Clamp the catheter. Problems and solutions  If blood cannot be completely cleared from the injection cap, you may need to have the injection cap replaced.  If the catheter is difficult to flush, use the pulsing method. The pulsing method involves pushing only a few milliliters of solution into the catheter at a time and pausing between pushes.  If you do not see blood in the catheter when you pull back on the syringe, change your body position, such as by raising your arms above your head. Take a deep breath and cough. Then, pull back on the syringe. If you still do not see blood, flush the catheter with a small amount of solution. Then, change positions again and take a breath or cough. Pull back on the syringe again. If you still do not   see blood, finish flushing the catheter and contact your health care provider. Do not use your catheter until your health care provider says it is okay. General tips  Have someone help you flush your catheter, if possible.  Do not force fluid through your catheter.  Do not use a syringe that is larger or smaller than 10 mL. Using a smaller syringe can make the catheter burst.  Do not use your catheter without flushing it first if it has heparin in it. Contact a health  care provider if:  You cannot see any blood in the catheter when you flush it before using it.  Your catheter is difficult to flush. Get help right away if:  You cannot flush the catheter.  The catheter leaks when you flush it or when there is fluid in it.  There are cracks or breaks in the catheter. Summary  It is important to flush your tunneled central venous catheter each time you use it, both before and after you use it.  Scrub the injection cap for 15 seconds with a disinfecting wipe before and after you flush it.  When you flush your catheter, make sure you follow any specific instructions from your health care provider or the manufacturer.  Get help right away if you cannot flush the catheter. This information is not intended to replace advice given to you by your health care provider. Make sure you discuss any questions you have with your health care provider. Document Revised: 12/31/2019 Document Reviewed: 01/07/2019 Elsevier Patient Education  2021 Elsevier Inc.  

## 2021-02-09 NOTE — Telephone Encounter (Signed)
Called pt and spoke with pt wife Victor [name]- Per MD instructed pt to Stop Veneloclax ( pt has already stopped will restart at next treatment day in May.) Take Difulcan 200mg  1 PO Day.Levaquin 500mg  1 PO day x 14 days. Victor [name] confirmed she has these medications and same instructions and was waiting for the call to have pt start this regiment.  No further concerns.

## 2021-02-10 ENCOUNTER — Other Ambulatory Visit: Payer: Self-pay | Admitting: *Deleted

## 2021-02-10 DIAGNOSIS — Z95828 Presence of other vascular implants and grafts: Secondary | ICD-10-CM

## 2021-02-10 DIAGNOSIS — C969 Malignant neoplasm of lymphoid, hematopoietic and related tissue, unspecified: Secondary | ICD-10-CM

## 2021-02-10 DIAGNOSIS — C9500 Acute leukemia of unspecified cell type not having achieved remission: Secondary | ICD-10-CM

## 2021-02-10 DIAGNOSIS — Z452 Encounter for adjustment and management of vascular access device: Secondary | ICD-10-CM

## 2021-02-10 NOTE — Assessment & Plan Note (Signed)
Acute on chronic in nature.  Having some pain similar to previous SI joint pain. -Counseled home exercise therapy and supportive care. -Can pursue injection but his platelets are currently around 20,000.  He will inform me when they have improved.

## 2021-02-13 ENCOUNTER — Inpatient Hospital Stay: Payer: Medicare Other

## 2021-02-13 ENCOUNTER — Telehealth: Payer: Self-pay

## 2021-02-13 ENCOUNTER — Other Ambulatory Visit: Payer: Self-pay

## 2021-02-13 ENCOUNTER — Encounter: Payer: Self-pay | Admitting: *Deleted

## 2021-02-13 ENCOUNTER — Telehealth: Payer: Self-pay | Admitting: *Deleted

## 2021-02-13 VITALS — BP 140/56 | HR 78 | Temp 98.2°F | Resp 18

## 2021-02-13 DIAGNOSIS — C969 Malignant neoplasm of lymphoid, hematopoietic and related tissue, unspecified: Secondary | ICD-10-CM | POA: Diagnosis not present

## 2021-02-13 DIAGNOSIS — Z452 Encounter for adjustment and management of vascular access device: Secondary | ICD-10-CM

## 2021-02-13 DIAGNOSIS — C9500 Acute leukemia of unspecified cell type not having achieved remission: Secondary | ICD-10-CM

## 2021-02-13 DIAGNOSIS — Z95828 Presence of other vascular implants and grafts: Secondary | ICD-10-CM

## 2021-02-13 LAB — CBC WITH DIFFERENTIAL (CANCER CENTER ONLY)
Abs Immature Granulocytes: 0.01 10*3/uL (ref 0.00–0.07)
Basophils Absolute: 0 10*3/uL (ref 0.0–0.1)
Basophils Relative: 1 %
Eosinophils Absolute: 0 10*3/uL (ref 0.0–0.5)
Eosinophils Relative: 0 %
HCT: 33.6 % — ABNORMAL LOW (ref 39.0–52.0)
Hemoglobin: 11.2 g/dL — ABNORMAL LOW (ref 13.0–17.0)
Immature Granulocytes: 1 %
Lymphocytes Relative: 65 %
Lymphs Abs: 0.7 10*3/uL (ref 0.7–4.0)
MCH: 33.2 pg (ref 26.0–34.0)
MCHC: 33.3 g/dL (ref 30.0–36.0)
MCV: 99.7 fL (ref 80.0–100.0)
Monocytes Absolute: 0.1 10*3/uL (ref 0.1–1.0)
Monocytes Relative: 9 %
Neutro Abs: 0.3 10*3/uL — CL (ref 1.7–7.7)
Neutrophils Relative %: 24 %
Platelet Count: 33 10*3/uL — ABNORMAL LOW (ref 150–400)
RBC: 3.37 MIL/uL — ABNORMAL LOW (ref 4.22–5.81)
RDW: 16.8 % — ABNORMAL HIGH (ref 11.5–15.5)
WBC Count: 1.1 10*3/uL — ABNORMAL LOW (ref 4.0–10.5)
nRBC: 0 % (ref 0.0–0.2)

## 2021-02-13 LAB — CMP (CANCER CENTER ONLY)
ALT: 7 U/L (ref 0–44)
AST: 11 U/L — ABNORMAL LOW (ref 15–41)
Albumin: 3.9 g/dL (ref 3.5–5.0)
Alkaline Phosphatase: 57 U/L (ref 38–126)
Anion gap: 7 (ref 5–15)
BUN: 16 mg/dL (ref 8–23)
CO2: 27 mmol/L (ref 22–32)
Calcium: 9 mg/dL (ref 8.9–10.3)
Chloride: 106 mmol/L (ref 98–111)
Creatinine: 1 mg/dL (ref 0.61–1.24)
GFR, Estimated: >60 mL/min (ref >60)
Glucose, Bld: 102 mg/dL — ABNORMAL HIGH (ref 70–99)
Potassium: 3.7 mmol/L (ref 3.5–5.1)
Sodium: 140 mmol/L (ref 135–145)
Total Bilirubin: 0.6 mg/dL (ref 0.3–1.2)
Total Protein: 5.5 g/dL — ABNORMAL LOW (ref 6.5–8.1)

## 2021-02-13 MED ORDER — SODIUM CHLORIDE 0.9% FLUSH
10.0000 mL | Freq: Once | INTRAVENOUS | Status: AC
  Administered 2021-02-13: 10 mL
  Filled 2021-02-13: qty 10

## 2021-02-13 MED ORDER — HEPARIN SOD (PORK) LOCK FLUSH 100 UNIT/ML IV SOLN
250.0000 [IU] | Freq: Once | INTRAVENOUS | Status: AC
  Administered 2021-02-13: 500 [IU]
  Filled 2021-02-13: qty 5

## 2021-02-13 NOTE — Progress Notes (Signed)
Patient states that he was told via phone call from [facility] that he could decrease his weekly appts from twice a week to once a week if OK with Dr. [name].  Dr. [name] notified.  Per Dr. [name], pt OK to come in once a week for lab check on non-treatment weeks.  Message sent to scheduling.

## 2021-02-13 NOTE — Telephone Encounter (Signed)
Dr. [name] notified of WBC-1.1, platelet ct-33 and ANC-0.3.  No new orders received at this time per Dr. [name].

## 2021-02-13 NOTE — Patient Instructions (Signed)

## 2021-02-13 NOTE — Telephone Encounter (Signed)
Per 02/13/21 sch message we have added in a visit with dr [name] on 4/18, times have been adjusted.  Also per secure chat from vanessa we can cancel all of the pts on thurs.  All appt info has been left on his vm and he can also view on my chart   Lilyann Gravelle

## 2021-02-15 ENCOUNTER — Telehealth: Payer: Self-pay

## 2021-02-15 NOTE — Telephone Encounter (Signed)
Per the req of the pt and stacey the pt was called to review his appts.  Pt has had a hard time keeping track in mychart.  All appts reviewed  And chemo times changes to earlier in the day per pt req.  I advised the pt to gain a calendar at his Monday 02/20/21 appt    Sims Laday

## 2021-02-16 ENCOUNTER — Other Ambulatory Visit: Payer: Medicare Other

## 2021-02-17 ENCOUNTER — Other Ambulatory Visit: Payer: Self-pay | Admitting: *Deleted

## 2021-02-17 DIAGNOSIS — C969 Malignant neoplasm of lymphoid, hematopoietic and related tissue, unspecified: Secondary | ICD-10-CM

## 2021-02-17 DIAGNOSIS — C9500 Acute leukemia of unspecified cell type not having achieved remission: Secondary | ICD-10-CM

## 2021-02-17 DIAGNOSIS — Z452 Encounter for adjustment and management of vascular access device: Secondary | ICD-10-CM

## 2021-02-17 DIAGNOSIS — Z95828 Presence of other vascular implants and grafts: Secondary | ICD-10-CM

## 2021-02-17 DIAGNOSIS — C9300 Acute monoblastic/monocytic leukemia, not having achieved remission: Secondary | ICD-10-CM

## 2021-02-17 DIAGNOSIS — D696 Thrombocytopenia, unspecified: Secondary | ICD-10-CM

## 2021-02-20 ENCOUNTER — Other Ambulatory Visit: Payer: Medicare Other

## 2021-02-20 ENCOUNTER — Inpatient Hospital Stay: Payer: Medicare Other

## 2021-02-20 ENCOUNTER — Ambulatory Visit: Payer: Medicare Other

## 2021-02-20 ENCOUNTER — Ambulatory Visit: Payer: Medicare Other | Admitting: Hematology & Oncology

## 2021-02-20 ENCOUNTER — Other Ambulatory Visit: Payer: Self-pay

## 2021-02-20 ENCOUNTER — Inpatient Hospital Stay (HOSPITAL_BASED_OUTPATIENT_CLINIC_OR_DEPARTMENT_OTHER): Payer: Medicare Other | Admitting: Hematology & Oncology

## 2021-02-20 ENCOUNTER — Encounter: Payer: Self-pay | Admitting: Hematology & Oncology

## 2021-02-20 VITALS — BP 137/64 | HR 77 | Temp 97.6°F | Resp 18

## 2021-02-20 DIAGNOSIS — C9500 Acute leukemia of unspecified cell type not having achieved remission: Secondary | ICD-10-CM

## 2021-02-20 DIAGNOSIS — Z95828 Presence of other vascular implants and grafts: Secondary | ICD-10-CM

## 2021-02-20 DIAGNOSIS — Z452 Encounter for adjustment and management of vascular access device: Secondary | ICD-10-CM

## 2021-02-20 DIAGNOSIS — C9300 Acute monoblastic/monocytic leukemia, not having achieved remission: Secondary | ICD-10-CM

## 2021-02-20 DIAGNOSIS — D696 Thrombocytopenia, unspecified: Secondary | ICD-10-CM

## 2021-02-20 DIAGNOSIS — C969 Malignant neoplasm of lymphoid, hematopoietic and related tissue, unspecified: Secondary | ICD-10-CM

## 2021-02-20 LAB — CMP (CANCER CENTER ONLY)
ALT: 7 U/L (ref 0–44)
AST: 12 U/L — ABNORMAL LOW (ref 15–41)
Albumin: 4 g/dL (ref 3.5–5.0)
Alkaline Phosphatase: 62 U/L (ref 38–126)
Anion gap: 9 (ref 5–15)
BUN: 17 mg/dL (ref 8–23)
CO2: 26 mmol/L (ref 22–32)
Calcium: 9.1 mg/dL (ref 8.9–10.3)
Chloride: 106 mmol/L (ref 98–111)
Creatinine: 1.02 mg/dL (ref 0.61–1.24)
GFR, Estimated: >60 mL/min (ref >60)
Glucose, Bld: 104 mg/dL — ABNORMAL HIGH (ref 70–99)
Potassium: 3.6 mmol/L (ref 3.5–5.1)
Sodium: 141 mmol/L (ref 135–145)
Total Bilirubin: 0.7 mg/dL (ref 0.3–1.2)
Total Protein: 5.9 g/dL — ABNORMAL LOW (ref 6.5–8.1)

## 2021-02-20 LAB — IRON AND TIBC
Iron: 101 ug/dL (ref 42–163)
Saturation Ratios: 43 % (ref 20–55)
TIBC: 234 ug/dL (ref 202–409)
UIBC: 132 ug/dL (ref 117–376)

## 2021-02-20 LAB — CBC WITH DIFFERENTIAL (CANCER CENTER ONLY)
Abs Immature Granulocytes: 0.05 10*3/uL (ref 0.00–0.07)
Basophils Absolute: 0 10*3/uL (ref 0.0–0.1)
Basophils Relative: 2 %
Eosinophils Absolute: 0 10*3/uL (ref 0.0–0.5)
Eosinophils Relative: 0 %
HCT: 35.4 % — ABNORMAL LOW (ref 39.0–52.0)
Hemoglobin: 11.5 g/dL — ABNORMAL LOW (ref 13.0–17.0)
Immature Granulocytes: 2 %
Lymphocytes Relative: 36 %
Lymphs Abs: 0.8 10*3/uL (ref 0.7–4.0)
MCH: 32.5 pg (ref 26.0–34.0)
MCHC: 32.5 g/dL (ref 30.0–36.0)
MCV: 100 fL (ref 80.0–100.0)
Monocytes Absolute: 0.5 10*3/uL (ref 0.1–1.0)
Monocytes Relative: 22 %
Neutro Abs: 0.8 10*3/uL — ABNORMAL LOW (ref 1.7–7.7)
Neutrophils Relative %: 38 %
Platelet Count: 72 10*3/uL — ABNORMAL LOW (ref 150–400)
RBC: 3.54 MIL/uL — ABNORMAL LOW (ref 4.22–5.81)
RDW: 16.5 % — ABNORMAL HIGH (ref 11.5–15.5)
WBC Count: 2.2 10*3/uL — ABNORMAL LOW (ref 4.0–10.5)
nRBC: 0 % (ref 0.0–0.2)

## 2021-02-20 LAB — FERRITIN: Ferritin: 629 ng/mL — ABNORMAL HIGH (ref 24–336)

## 2021-02-20 LAB — LACTATE DEHYDROGENASE: LDH: 203 U/L — ABNORMAL HIGH (ref 98–192)

## 2021-02-20 LAB — RETICULOCYTES
Immature Retic Fract: 26.9 % — ABNORMAL HIGH (ref 2.3–15.9)
RBC.: 3.47 MIL/uL — ABNORMAL LOW (ref 4.22–5.81)
Retic Count, Absolute: 127 10*3/uL (ref 19.0–186.0)
Retic Ct Pct: 3.7 % — ABNORMAL HIGH (ref 0.4–3.1)

## 2021-02-20 LAB — MAGNESIUM: Magnesium: 2.3 mg/dL (ref 1.7–2.4)

## 2021-02-20 LAB — SAVE SMEAR(SSMR), FOR PROVIDER SLIDE REVIEW

## 2021-02-20 MED ORDER — SODIUM CHLORIDE 0.9% FLUSH
10.0000 mL | INTRAVENOUS | Status: DC | PRN
  Administered 2021-02-20: 10 mL via INTRAVENOUS
  Filled 2021-02-20: qty 10

## 2021-02-20 MED ORDER — HEPARIN SOD (PORK) LOCK FLUSH 100 UNIT/ML IV SOLN
500.0000 [IU] | Freq: Once | INTRAVENOUS | Status: AC
  Administered 2021-02-20: 500 [IU] via INTRAVENOUS
  Filled 2021-02-20: qty 5

## 2021-02-20 NOTE — Addendum Note (Signed)
Addended by: Melton Krebs on: 02/20/2021 09:11 AM   Modules accepted: Orders, SmartSet

## 2021-02-20 NOTE — Patient Instructions (Signed)

## 2021-02-20 NOTE — Progress Notes (Signed)
Hematology and Oncology Follow Up Visit  Victor [name] [id number] 05/16/1936 85 y.o. 02/20/2021   Principle Diagnosis:   Acute myeloid leukemia - FLT3 (+)    Current Therapy:    Vidaza/Venetoclax.--  S/p cycle #4     Interim History:  Victor [name] is back for follow-up.  His son from [place] comes in with him.  It was certainly a lot of fun talking with both of them.  He looks good.  His platelet count is coming up quite nicely.  His white cell count also seems to be improving.  So far, he is done nicely with the Vidaza/Venetoclax protocol.  His blood counts are doing better so we only have to do labs on him once a week.  He has had no problems with fever.  He has had no mouth sores.  He has had no issues with nausea or vomiting.  There is been no change in bowel or bladder habits.  He may have little swelling around his ankles.  He has some bruises.  This is no surprise given that he does have the thrombocytopenia.  I am not sure when his next bone marrow biopsy will be set up.  I would have to think that is should be soon.  I would have to say that currently, his performance status is ECOG 1.    He now has a Port-A-Cath in.  This is making his life a lot easier.    Medications:  Current Outpatient Medications:  .  acetaminophen (TYLENOL) 500 MG tablet, Take 1,000 mg by mouth every 6 (six) hours as needed., Disp: , Rfl:  .  acyclovir (ZOVIRAX) 400 MG tablet, Take 1 tablet (400 mg total) by mouth 2 (two) times daily as needed. (Patient not taking: No sig reported), Disp: 60 tablet, Rfl: 2 .  amoxicillin (AMOXIL) 500 MG capsule, Take 4 capsules by mouth one hour prior to dental procedure for prophylaxis given indwelling catheter. (Patient not taking: No sig reported), Disp: , Rfl:  .  doxazosin (CARDURA) 2 MG tablet, Take 1 tablet (2 mg total) by mouth at bedtime., Disp: 90 tablet, Rfl: 3 .  fluconazole (DIFLUCAN) 200 MG tablet, Take 200 mg by mouth daily. (Patient not  taking: Reported on 01/23/2021), Disp: , Rfl:  .  fluticasone (FLONASE) 50 MCG/ACT nasal spray, Place 2 sprays into the nose 2 (two) times daily as needed., Disp: , Rfl:  .  HEMP OIL-VANILLYL BUTYL ETHER EX, Apply topically daily. (Patient not taking: Reported on 01/23/2021), Disp: , Rfl:  .  hydrALAZINE (APRESOLINE) 25 MG tablet, Take 1 tablet (25 mg total) by mouth 3 (three) times daily as needed. Take if standing BP >140/80 mm Hg (Patient not taking: No sig reported), Disp: 90 tablet, Rfl: 2 .  HYDROcodone-acetaminophen (NORCO) 5-325 MG tablet, Take 1 tablet by mouth every 6 (six) hours as needed for moderate pain., Disp: 40 tablet, Rfl: 0 .  ibuprofen (ADVIL) 200 MG tablet, Take 400 mg by mouth every 6 (six) hours as needed., Disp: , Rfl:  .  levofloxacin (LEVAQUIN) 500 MG tablet, Take 500 mg by mouth daily. (Patient not taking: Reported on 01/23/2021), Disp: , Rfl:  .  levothyroxine (SYNTHROID) 175 MCG tablet, Take 175 mcg by mouth daily., Disp: , Rfl:  .  lidocaine-prilocaine (EMLA) cream, Apply 1 application topically as needed for up to 30 doses., Disp: 30 g, Rfl: 0 .  Loratadine (CLARITIN PO), Take 10 mg by mouth at bedtime. Allertec, Disp: , Rfl:  .  Melatonin 5 MG CHEW, Chew 1 tablet by mouth daily as needed. , Disp: , Rfl:  .  omeprazole (PRILOSEC) 20 MG capsule, Take 1 capsule (20 mg total) by mouth 2 (two) times daily before a meal., Disp: 180 capsule, Rfl: 3 .  potassium chloride (MICRO-K) 10 MEQ CR capsule, Take 10 mEq by mouth in the morning and at bedtime., Disp: , Rfl:  .  propranolol ER (INDERAL LA) 80 MG 24 hr capsule, Take 1 capsule (80 mg total) by mouth daily., Disp: 90 capsule, Rfl: 3 .  simvastatin (ZOCOR) 40 MG tablet, Take 1 tablet (40 mg total) by mouth at bedtime., Disp: 90 tablet, Rfl: 3 .  venetoclax 100 MG TABS, Take 200 mg by mouth daily., Disp: , Rfl:  No current facility-administered medications for this visit.  Facility-Administered Medications Ordered in Other  Visits:  .  sodium chloride flush (NS) 0.9 % injection 10 mL, 10 mL, Intracatheter, PRN, Ladell Pier, MD, 10 mL at 11/03/20 1625 .  sodium chloride flush (NS) 0.9 % injection 10 mL, 10 mL, Intravenous, PRN, [place], Sarah M, NP, 10 mL at 02/20/21 0900  Allergies: No Known Allergies  Past Medical History, Surgical history, Social history, and Family History were reviewed and updated.  Review of Systems: Review of Systems  Constitutional: Negative.   HENT:  Negative.   Eyes: Negative.   Respiratory: Negative.   Cardiovascular: Negative.   Gastrointestinal: Negative.   Endocrine: Negative.   Genitourinary: Negative.    Musculoskeletal: Negative.   Skin: Negative.   Neurological: Negative.   Hematological: Negative.   Psychiatric/Behavioral: Negative.     Physical Exam:  vitals were not taken for this visit.   Wt Readings from Last 3 Encounters:  02/09/21 197 lb (89.4 kg)  01/23/21 195 lb (88.5 kg)  01/09/21 199 lb 6.4 oz (90.4 kg)    Physical Exam Vitals reviewed.  HENT:     Head: Normocephalic and atraumatic.  Eyes:     Pupils: Pupils are equal, round, and reactive to light.  Cardiovascular:     Rate and Rhythm: Normal rate and regular rhythm.     Heart sounds: Normal heart sounds.  Pulmonary:     Effort: Pulmonary effort is normal.     Breath sounds: Normal breath sounds.  Abdominal:     General: Bowel sounds are normal.     Palpations: Abdomen is soft.  Musculoskeletal:        General: No tenderness or deformity. Normal range of motion.     Cervical back: Normal range of motion.  Lymphadenopathy:     Cervical: No cervical adenopathy.  Skin:    General: Skin is warm and dry.     Findings: No erythema or rash.  Neurological:     Mental Status: He is alert and oriented to person, place, and time.  Psychiatric:        Behavior: Behavior normal.        Thought Content: Thought content normal.        Judgment: Judgment normal.      Lab Results   Component Value Date   WBC 2.2 (L) 02/20/2021   HGB 11.5 (L) 02/20/2021   HCT 35.4 (L) 02/20/2021   MCV 100.0 02/20/2021   PLT 72 (L) 02/20/2021     Chemistry      Component Value Date/Time   NA 141 02/20/2021 0834   NA 142 03/30/2015 1149   K 3.6 02/20/2021 0834   K 4.5 03/30/2015 1149  CL 106 02/20/2021 0834   CO2 26 02/20/2021 0834   CO2 25 03/30/2015 1149   BUN 17 02/20/2021 0834   BUN 17.1 03/30/2015 1149   CREATININE 1.02 02/20/2021 0834   CREATININE 1.2 03/30/2015 1149      Component Value Date/Time   CALCIUM 9.1 02/20/2021 0834   CALCIUM 8.7 03/30/2015 1149   ALKPHOS 62 02/20/2021 0834   AST 12 (L) 02/20/2021 0834   ALT 7 02/20/2021 0834   BILITOT 0.7 02/20/2021 0834       Impression and Plan: Victor [name] is a very nice 85 year old white male who has acute myeloid leukemia.  He does have a adverse prognostic feature with respect to the FLT3 mutation.  I know there are targeted therapies for patients who have leukemia with this FLT3 mutation.  Again, Dr. Linus Orn is doing a fantastic job with him.  He is responding well to the Vidaza and the venetoclax.  There is no indication that we have to put him on one of the new targeted therapy.  I would think that he will be due for another bone marrow biopsy.  I am not sure when he goes back to [facility] to see Dr. Linus Orn.  He is due for his fifth cycle of treatment in 2 weeks.  I will plan to see him back that day.  Again, it is nice that his son came in from [place].  We talked quite a bit.  He is incredibly interesting to talk to.   Volanda Napoleon, MD 4/18/202210:53 AM

## 2021-02-21 ENCOUNTER — Ambulatory Visit: Payer: Medicare Other

## 2021-02-21 ENCOUNTER — Encounter: Payer: Self-pay | Admitting: *Deleted

## 2021-02-22 ENCOUNTER — Ambulatory Visit: Payer: Medicare Other

## 2021-02-23 ENCOUNTER — Other Ambulatory Visit: Payer: Medicare Other

## 2021-02-23 ENCOUNTER — Ambulatory Visit: Payer: Medicare Other

## 2021-02-23 ENCOUNTER — Other Ambulatory Visit: Payer: Self-pay | Admitting: Family Medicine

## 2021-02-23 DIAGNOSIS — E785 Hyperlipidemia, unspecified: Secondary | ICD-10-CM

## 2021-02-24 ENCOUNTER — Ambulatory Visit: Payer: Medicare Other

## 2021-02-24 ENCOUNTER — Other Ambulatory Visit: Payer: Self-pay | Admitting: *Deleted

## 2021-02-24 DIAGNOSIS — C969 Malignant neoplasm of lymphoid, hematopoietic and related tissue, unspecified: Secondary | ICD-10-CM

## 2021-02-24 DIAGNOSIS — C9300 Acute monoblastic/monocytic leukemia, not having achieved remission: Secondary | ICD-10-CM

## 2021-02-24 DIAGNOSIS — C9500 Acute leukemia of unspecified cell type not having achieved remission: Secondary | ICD-10-CM

## 2021-02-24 DIAGNOSIS — Z95828 Presence of other vascular implants and grafts: Secondary | ICD-10-CM

## 2021-02-27 ENCOUNTER — Other Ambulatory Visit: Payer: Self-pay

## 2021-02-27 ENCOUNTER — Ambulatory Visit: Payer: Medicare Other

## 2021-02-27 ENCOUNTER — Inpatient Hospital Stay: Payer: Medicare Other

## 2021-02-27 DIAGNOSIS — C969 Malignant neoplasm of lymphoid, hematopoietic and related tissue, unspecified: Secondary | ICD-10-CM

## 2021-02-27 DIAGNOSIS — C9500 Acute leukemia of unspecified cell type not having achieved remission: Secondary | ICD-10-CM

## 2021-02-27 DIAGNOSIS — C9300 Acute monoblastic/monocytic leukemia, not having achieved remission: Secondary | ICD-10-CM

## 2021-02-27 DIAGNOSIS — Z95828 Presence of other vascular implants and grafts: Secondary | ICD-10-CM

## 2021-02-27 LAB — CMP (CANCER CENTER ONLY)
ALT: 6 U/L (ref 0–44)
AST: 10 U/L — ABNORMAL LOW (ref 15–41)
Albumin: 3.9 g/dL (ref 3.5–5.0)
Alkaline Phosphatase: 58 U/L (ref 38–126)
Anion gap: 7 (ref 5–15)
BUN: 19 mg/dL (ref 8–23)
CO2: 28 mmol/L (ref 22–32)
Calcium: 9 mg/dL (ref 8.9–10.3)
Chloride: 107 mmol/L (ref 98–111)
Creatinine: 0.92 mg/dL (ref 0.61–1.24)
GFR, Estimated: >60 mL/min (ref >60)
Glucose, Bld: 113 mg/dL — ABNORMAL HIGH (ref 70–99)
Potassium: 3.6 mmol/L (ref 3.5–5.1)
Sodium: 142 mmol/L (ref 135–145)
Total Bilirubin: 0.5 mg/dL (ref 0.3–1.2)
Total Protein: 5.4 g/dL — ABNORMAL LOW (ref 6.5–8.1)

## 2021-02-27 LAB — CBC WITH DIFFERENTIAL (CANCER CENTER ONLY)
Abs Immature Granulocytes: 0.07 10*3/uL (ref 0.00–0.07)
Basophils Absolute: 0 10*3/uL (ref 0.0–0.1)
Basophils Relative: 1 %
Eosinophils Absolute: 0 10*3/uL (ref 0.0–0.5)
Eosinophils Relative: 0 %
HCT: 33.8 % — ABNORMAL LOW (ref 39.0–52.0)
Hemoglobin: 11.2 g/dL — ABNORMAL LOW (ref 13.0–17.0)
Immature Granulocytes: 2 %
Lymphocytes Relative: 23 %
Lymphs Abs: 0.8 10*3/uL (ref 0.7–4.0)
MCH: 32.9 pg (ref 26.0–34.0)
MCHC: 33.1 g/dL (ref 30.0–36.0)
MCV: 99.4 fL (ref 80.0–100.0)
Monocytes Absolute: 0.5 10*3/uL (ref 0.1–1.0)
Monocytes Relative: 16 %
Neutro Abs: 2 10*3/uL (ref 1.7–7.7)
Neutrophils Relative %: 58 %
Platelet Count: 50 10*3/uL — ABNORMAL LOW (ref 150–400)
RBC: 3.4 MIL/uL — ABNORMAL LOW (ref 4.22–5.81)
RDW: 16.1 % — ABNORMAL HIGH (ref 11.5–15.5)
WBC Count: 3.4 10*3/uL — ABNORMAL LOW (ref 4.0–10.5)
nRBC: 0 % (ref 0.0–0.2)

## 2021-02-27 MED ORDER — HEPARIN SOD (PORK) LOCK FLUSH 100 UNIT/ML IV SOLN
500.0000 [IU] | Freq: Once | INTRAVENOUS | Status: AC
  Administered 2021-02-27: 500 [IU] via INTRAVENOUS
  Filled 2021-02-27: qty 5

## 2021-02-27 MED ORDER — SODIUM CHLORIDE 0.9% FLUSH
10.0000 mL | Freq: Once | INTRAVENOUS | Status: AC
Start: 2021-02-27 — End: 2021-02-27
  Administered 2021-02-27: 10 mL via INTRAVENOUS
  Filled 2021-02-27: qty 10

## 2021-02-27 NOTE — Patient Instructions (Signed)
Tunneled Central Venous Catheter Flushing Guide  It is important to flush your tunneled central venous catheter each time you use it, both before and after you use it. Flushing your catheter will help prevent it from clogging. What are the risks? Risks may include:  Infection.  Air getting into the catheter and bloodstream. Supplies needed:  A clean pair of gloves.  A disinfecting wipe. Use an alcohol wipe, chlorhexidine wipe, or iodine wipe as told by your health care provider.  A 10 mL syringe that has been prefilled with saline solution.  An empty 10 mL syringe, if a substance called heparin was injected into your catheter. How to flush your catheter When you flush your catheter, make sure you follow any specific instructions from your health care provider or the manufacturer. These are general guidelines. Flushing your catheter before use If there is heparin in your catheter: 1. Wash your hands with soap and water. 2. Put on gloves. 3. Scrub the injection cap for a minimum of 15 seconds with a disinfecting wipe. 4. Unclamp the catheter. 5. Attach the empty syringe to the injection cap. 6. Pull the syringe plunger back and withdraw 10 mL of blood. 7. Place the syringe into an appropriate waste container. 8. Scrub the injection cap for 15 seconds with a disinfecting wipe. 9. Attach the prefilled syringe to the injection cap. 10. Flush the catheter by pushing the plunger forward until all the liquid from the syringe is in the catheter. 11. Remove the syringe from the injection cap. 12. Clamp the catheter. If there is no heparin in your catheter: 1. Wash your hands with soap and water. 2. Put on gloves. 3. Scrub the injection cap for 15 seconds with a disinfecting wipe. 4. Unclamp the catheter. 5. Attach the prefilled syringe to the injection cap. 6. Flush the catheter by pushing the plunger forward until 5 mL of the liquid from the syringe is in the catheter. 7. Pull back on  the syringe until you see blood in the catheter. 8. If you have been asked to collect any blood, follow your health care provider's instructions. Otherwise, flush the catheter with the rest of the solution from the syringe. 9. Remove the syringe from the injection cap. 10. Clamp the catheter.   Flushing your catheter after use 1. Wash your hands with soap and water. 2. Put on gloves. 3. Scrub the injection cap for 15 seconds with a disinfecting wipe. 4. Unclamp the catheter. 5. Attach the prefilled syringe to the injection cap. 6. Flush the catheter by pushing the plunger forward until all of the liquid from the syringe is in the catheter. 7. Remove the syringe from the injection cap. 8. Clamp the catheter. Problems and solutions  If blood cannot be completely cleared from the injection cap, you may need to have the injection cap replaced.  If the catheter is difficult to flush, use the pulsing method. The pulsing method involves pushing only a few milliliters of solution into the catheter at a time and pausing between pushes.  If you do not see blood in the catheter when you pull back on the syringe, change your body position, such as by raising your arms above your head. Take a deep breath and cough. Then, pull back on the syringe. If you still do not see blood, flush the catheter with a small amount of solution. Then, change positions again and take a breath or cough. Pull back on the syringe again. If you still do not   see blood, finish flushing the catheter and contact your health care provider. Do not use your catheter until your health care provider says it is okay. General tips  Have someone help you flush your catheter, if possible.  Do not force fluid through your catheter.  Do not use a syringe that is larger or smaller than 10 mL. Using a smaller syringe can make the catheter burst.  Do not use your catheter without flushing it first if it has heparin in it. Contact a health  care provider if:  You cannot see any blood in the catheter when you flush it before using it.  Your catheter is difficult to flush. Get help right away if:  You cannot flush the catheter.  The catheter leaks when you flush it or when there is fluid in it.  There are cracks or breaks in the catheter. Summary  It is important to flush your tunneled central venous catheter each time you use it, both before and after you use it.  Scrub the injection cap for 15 seconds with a disinfecting wipe before and after you flush it.  When you flush your catheter, make sure you follow any specific instructions from your health care provider or the manufacturer.  Get help right away if you cannot flush the catheter. This information is not intended to replace advice given to you by your health care provider. Make sure you discuss any questions you have with your health care provider. Document Revised: 12/31/2019 Document Reviewed: 01/07/2019 Elsevier Patient Education  2021 Elsevier Inc.  

## 2021-02-28 ENCOUNTER — Ambulatory Visit: Payer: Medicare Other

## 2021-02-28 ENCOUNTER — Telehealth: Payer: Self-pay | Admitting: *Deleted

## 2021-02-28 NOTE — Telephone Encounter (Signed)
Returned call to pt and wife regarding lab results. Pt continue taking Venetoclax 400 mg/day. Request pt to call back with concerns.

## 2021-03-02 ENCOUNTER — Other Ambulatory Visit: Payer: Medicare Other

## 2021-03-06 ENCOUNTER — Other Ambulatory Visit: Payer: Medicare Other

## 2021-03-06 ENCOUNTER — Inpatient Hospital Stay: Payer: Medicare Other

## 2021-03-06 ENCOUNTER — Encounter: Payer: Self-pay | Admitting: Hematology & Oncology

## 2021-03-06 ENCOUNTER — Inpatient Hospital Stay (HOSPITAL_BASED_OUTPATIENT_CLINIC_OR_DEPARTMENT_OTHER): Payer: Medicare Other | Admitting: Hematology & Oncology

## 2021-03-06 ENCOUNTER — Other Ambulatory Visit: Payer: Self-pay

## 2021-03-06 ENCOUNTER — Inpatient Hospital Stay: Payer: Medicare Other | Attending: Oncology

## 2021-03-06 ENCOUNTER — Ambulatory Visit: Payer: Medicare Other

## 2021-03-06 DIAGNOSIS — C9500 Acute leukemia of unspecified cell type not having achieved remission: Secondary | ICD-10-CM | POA: Diagnosis not present

## 2021-03-06 DIAGNOSIS — D696 Thrombocytopenia, unspecified: Secondary | ICD-10-CM | POA: Diagnosis not present

## 2021-03-06 DIAGNOSIS — M7989 Other specified soft tissue disorders: Secondary | ICD-10-CM | POA: Diagnosis not present

## 2021-03-06 DIAGNOSIS — Z79899 Other long term (current) drug therapy: Secondary | ICD-10-CM | POA: Insufficient documentation

## 2021-03-06 DIAGNOSIS — C969 Malignant neoplasm of lymphoid, hematopoietic and related tissue, unspecified: Secondary | ICD-10-CM | POA: Insufficient documentation

## 2021-03-06 DIAGNOSIS — Z7901 Long term (current) use of anticoagulants: Secondary | ICD-10-CM | POA: Diagnosis not present

## 2021-03-06 DIAGNOSIS — R58 Hemorrhage, not elsewhere classified: Secondary | ICD-10-CM | POA: Diagnosis not present

## 2021-03-06 DIAGNOSIS — I2699 Other pulmonary embolism without acute cor pulmonale: Secondary | ICD-10-CM | POA: Insufficient documentation

## 2021-03-06 DIAGNOSIS — Z5111 Encounter for antineoplastic chemotherapy: Secondary | ICD-10-CM | POA: Diagnosis not present

## 2021-03-06 DIAGNOSIS — C9200 Acute myeloblastic leukemia, not having achieved remission: Secondary | ICD-10-CM | POA: Insufficient documentation

## 2021-03-06 LAB — CMP (CANCER CENTER ONLY)
ALT: 6 U/L (ref 0–44)
AST: 12 U/L — ABNORMAL LOW (ref 15–41)
Albumin: 3.9 g/dL (ref 3.5–5.0)
Alkaline Phosphatase: 61 U/L (ref 38–126)
Anion gap: 6 (ref 5–15)
BUN: 16 mg/dL (ref 8–23)
CO2: 29 mmol/L (ref 22–32)
Calcium: 8.9 mg/dL (ref 8.9–10.3)
Chloride: 106 mmol/L (ref 98–111)
Creatinine: 0.91 mg/dL (ref 0.61–1.24)
GFR, Estimated: >60 mL/min (ref >60)
Glucose, Bld: 96 mg/dL (ref 70–99)
Potassium: 3.5 mmol/L (ref 3.5–5.1)
Sodium: 141 mmol/L (ref 135–145)
Total Bilirubin: 0.5 mg/dL (ref 0.3–1.2)
Total Protein: 5.5 g/dL — ABNORMAL LOW (ref 6.5–8.1)

## 2021-03-06 LAB — CBC WITH DIFFERENTIAL (CANCER CENTER ONLY)
Abs Immature Granulocytes: 0.02 10*3/uL (ref 0.00–0.07)
Basophils Absolute: 0 10*3/uL (ref 0.0–0.1)
Basophils Relative: 0 %
Eosinophils Absolute: 0 10*3/uL (ref 0.0–0.5)
Eosinophils Relative: 0 %
HCT: 34.3 % — ABNORMAL LOW (ref 39.0–52.0)
Hemoglobin: 11.4 g/dL — ABNORMAL LOW (ref 13.0–17.0)
Immature Granulocytes: 1 %
Lymphocytes Relative: 25 %
Lymphs Abs: 1 10*3/uL (ref 0.7–4.0)
MCH: 32.9 pg (ref 26.0–34.0)
MCHC: 33.2 g/dL (ref 30.0–36.0)
MCV: 98.8 fL (ref 80.0–100.0)
Monocytes Absolute: 0.5 10*3/uL (ref 0.1–1.0)
Monocytes Relative: 13 %
Neutro Abs: 2.4 10*3/uL (ref 1.7–7.7)
Neutrophils Relative %: 61 %
Platelet Count: 41 10*3/uL — ABNORMAL LOW (ref 150–400)
RBC: 3.47 MIL/uL — ABNORMAL LOW (ref 4.22–5.81)
RDW: 15.9 % — ABNORMAL HIGH (ref 11.5–15.5)
WBC Count: 3.8 10*3/uL — ABNORMAL LOW (ref 4.0–10.5)
nRBC: 0 % (ref 0.0–0.2)

## 2021-03-06 LAB — SAVE SMEAR(SSMR), FOR PROVIDER SLIDE REVIEW

## 2021-03-06 LAB — LACTATE DEHYDROGENASE: LDH: 192 U/L (ref 98–192)

## 2021-03-06 MED ORDER — PALONOSETRON HCL INJECTION 0.25 MG/5ML
0.2500 mg | Freq: Once | INTRAVENOUS | Status: AC
  Administered 2021-03-06: 0.25 mg via INTRAVENOUS

## 2021-03-06 MED ORDER — HEPARIN SOD (PORK) LOCK FLUSH 100 UNIT/ML IV SOLN
500.0000 [IU] | Freq: Once | INTRAVENOUS | Status: AC | PRN
  Administered 2021-03-06: 500 [IU]
  Filled 2021-03-06: qty 5

## 2021-03-06 MED ORDER — DEXAMETHASONE SODIUM PHOSPHATE 100 MG/10ML IJ SOLN
10.0000 mg | Freq: Once | INTRAMUSCULAR | Status: AC
  Administered 2021-03-06: 10 mg via INTRAVENOUS
  Filled 2021-03-06: qty 10

## 2021-03-06 MED ORDER — PALONOSETRON HCL INJECTION 0.25 MG/5ML
INTRAVENOUS | Status: AC
  Filled 2021-03-06: qty 5

## 2021-03-06 MED ORDER — SODIUM CHLORIDE 0.9 % IV SOLN
Freq: Once | INTRAVENOUS | Status: AC
  Filled 2021-03-06: qty 250

## 2021-03-06 MED ORDER — SODIUM CHLORIDE 0.9 % IV SOLN
75.0000 mg/m2 | Freq: Once | INTRAVENOUS | Status: AC
  Administered 2021-03-06: 155 mg via INTRAVENOUS
  Filled 2021-03-06: qty 15.5

## 2021-03-06 MED ORDER — SODIUM CHLORIDE 0.9% FLUSH
10.0000 mL | INTRAVENOUS | Status: DC | PRN
  Administered 2021-03-06: 10 mL
  Filled 2021-03-06: qty 10

## 2021-03-06 NOTE — Patient Instructions (Signed)
Azacitidine suspension for injection (subcutaneous use) What is this medicine? AZACITIDINE (ay za SITE i deen) is a chemotherapy drug. This medicine reduces the growth of cancer cells and can suppress the immune system. It is used for treating myelodysplastic syndrome or some types of leukemia. This medicine may be used for other purposes; ask your health care provider or pharmacist if you have questions. COMMON BRAND NAME(S): Vidaza What should I tell my health care provider before I take this medicine? They need to know if you have any of these conditions:  kidney disease  liver disease  liver tumors  an unusual or allergic reaction to azacitidine, mannitol, other medicines, foods, dyes, or preservatives  pregnant or trying to get pregnant  breast-feeding How should I use this medicine? This medicine is for injection under the skin. It is administered in a hospital or clinic by a specially trained health care professional. Talk to your pediatrician regarding the use of this medicine in children. While this drug may be prescribed for selected conditions, precautions do apply. Overdosage: If you think you have taken too much of this medicine contact a poison control center or emergency room at once. NOTE: This medicine is only for you. Do not share this medicine with others. What if I miss a dose? It is important not to miss your dose. Call your doctor or health care professional if you are unable to keep an appointment. What may interact with this medicine? Interactions have not been studied. Give your health care provider a list of all the medicines, herbs, non-prescription drugs, or dietary supplements you use. Also tell them if you smoke, drink alcohol, or use illegal drugs. Some items may interact with your medicine. This list may not describe all possible interactions. Give your health care provider a list of all the medicines, herbs, non-prescription drugs, or dietary supplements  you use. Also tell them if you smoke, drink alcohol, or use illegal drugs. Some items may interact with your medicine. What should I watch for while using this medicine? Visit your doctor for checks on your progress. This drug may make you feel generally unwell. This is not uncommon, as chemotherapy can affect healthy cells as well as cancer cells. Report any side effects. Continue your course of treatment even though you feel ill unless your doctor tells you to stop. In some cases, you may be given additional medicines to help with side effects. Follow all directions for their use. Call your doctor or health care professional for advice if you get a fever, chills or sore throat, or other symptoms of a cold or flu. Do not treat yourself. This drug decreases your body's ability to fight infections. Try to avoid being around people who are sick. This medicine may increase your risk to bruise or bleed. Call your doctor or health care professional if you notice any unusual bleeding. You may need blood work done while you are taking this medicine. Do not become pregnant while taking this medicine and for 6 months after the last dose. Women should inform their doctor if they wish to become pregnant or think they might be pregnant. Men should not father a child while taking this medicine and for 3 months after the last dose. There is a potential for serious side effects to an unborn child. Talk to your health care professional or pharmacist for more information. Do not breast-feed an infant while taking this medicine and for 1 week after the last dose. This medicine may interfere with   the ability to have a child. Talk with your doctor or health care professional if you are concerned about your fertility. What side effects may I notice from receiving this medicine? Side effects that you should report to your doctor or health care professional as soon as possible:  allergic reactions like skin rash, itching or  hives, swelling of the face, lips, or tongue  low blood counts - this medicine may decrease the number of white blood cells, red blood cells and platelets. You may be at increased risk for infections and bleeding.  signs of infection - fever or chills, cough, sore throat, pain passing urine  signs of decreased platelets or bleeding - bruising, pinpoint red spots on the skin, black, tarry stools, blood in the urine  signs of decreased red blood cells - unusually weak or tired, fainting spells, lightheadedness  signs and symptoms of kidney injury like trouble passing urine or change in the amount of urine  signs and symptoms of liver injury like dark yellow or brown urine; general ill feeling or flu-like symptoms; light-colored stools; loss of appetite; nausea; right upper belly pain; unusually weak or tired; yellowing of the eyes or skin Side effects that usually do not require medical attention (report to your doctor or health care professional if they continue or are bothersome):  constipation  diarrhea  nausea, vomiting  pain or redness at the injection site  unusually weak or tired This list may not describe all possible side effects. Call your doctor for medical advice about side effects. You may report side effects to FDA at 1-800-FDA-1088. Where should I keep my medicine? This drug is given in a hospital or clinic and will not be stored at home. NOTE: This sheet is a summary. It may not cover all possible information. If you have questions about this medicine, talk to your doctor, pharmacist, or health care provider.  2021 Elsevier/Gold Standard (2016-11-20 14:37:51)  

## 2021-03-06 NOTE — Progress Notes (Signed)
Hematology and Oncology Follow Up Visit  Victor [name] [id number] 02-02-1936 85 y.o. 03/06/2021   Principle Diagnosis:   Acute myeloid leukemia - FLT3 (+)    Current Therapy:    Vidaza/Venetoclax.--  S/p cycle #4     Interim History:  [name] is back for follow-up.  He comes in with his wife.  As always, it is a lot of fun to talk with both of them.  He did see Dr. Linus Orn at [facility].  Apparently Dr. Linus Orn is quite pleased with how well he is doing.  Dr. Linus Orn do not think he needed a bone marrow biopsy as of yet.  His Port-A-Cath is working well.  I am glad that he had a Port-A-Cath placed.  He has had no fever.  He has had no problems with nausea or vomiting.  He has had no diarrhea.  He had a little bit of leg swelling.  He has had some ecchymoses.  I do not think there is been any mouth sores.  He is doing well with the venetoclax.  He takes is 2 weeks out of every 6-week cycle.  He clearly is on a more "maintenance" type of schedule.  Again, his blood counts seem to be responding pretty well.  I told him that the only way to know is true remission status is with a bone marrow biopsy.  I am just happy that everything seems to be going well for him right now.  He really has done a fantastic job with this protocol.  I know this is been very tough but he has had very good support from his family.  Currently, I would say his performance status is ECOG 1.     Medications:  Current Outpatient Medications:  .  acetaminophen (TYLENOL) 500 MG tablet, Take 1,000 mg by mouth every 6 (six) hours as needed., Disp: , Rfl:  .  acyclovir (ZOVIRAX) 400 MG tablet, Take 1 tablet (400 mg total) by mouth 2 (two) times daily as needed. (Patient not taking: No sig reported), Disp: 60 tablet, Rfl: 2 .  amoxicillin (AMOXIL) 500 MG capsule, Take 4 capsules by mouth one hour prior to dental procedure for prophylaxis given indwelling catheter. (Patient not taking: No sig reported), Disp: ,  Rfl:  .  doxazosin (CARDURA) 2 MG tablet, Take 1 tablet (2 mg total) by mouth at bedtime., Disp: 90 tablet, Rfl: 3 .  fluconazole (DIFLUCAN) 200 MG tablet, Take 200 mg by mouth daily. (Patient not taking: Reported on 01/23/2021), Disp: , Rfl:  .  fluticasone (FLONASE) 50 MCG/ACT nasal spray, Place 2 sprays into the nose 2 (two) times daily as needed., Disp: , Rfl:  .  HEMP OIL-VANILLYL BUTYL ETHER EX, Apply topically daily. (Patient not taking: Reported on 01/23/2021), Disp: , Rfl:  .  hydrALAZINE (APRESOLINE) 25 MG tablet, Take 1 tablet (25 mg total) by mouth 3 (three) times daily as needed. Take if standing BP >140/80 mm Hg (Patient not taking: No sig reported), Disp: 90 tablet, Rfl: 2 .  HYDROcodone-acetaminophen (NORCO) 5-325 MG tablet, Take 1 tablet by mouth every 6 (six) hours as needed for moderate pain., Disp: 40 tablet, Rfl: 0 .  ibuprofen (ADVIL) 200 MG tablet, Take 400 mg by mouth every 6 (six) hours as needed., Disp: , Rfl:  .  levofloxacin (LEVAQUIN) 500 MG tablet, Take 500 mg by mouth daily. (Patient not taking: Reported on 01/23/2021), Disp: , Rfl:  .  levothyroxine (SYNTHROID) 175 MCG tablet, Take 175 mcg by  mouth daily., Disp: , Rfl:  .  lidocaine-prilocaine (EMLA) cream, Apply 1 application topically as needed for up to 30 doses., Disp: 30 g, Rfl: 0 .  Loratadine (CLARITIN PO), Take 10 mg by mouth at bedtime. Allertec, Disp: , Rfl:  .  Melatonin 5 MG CHEW, Chew 1 tablet by mouth daily as needed. , Disp: , Rfl:  .  omeprazole (PRILOSEC) 20 MG capsule, Take 1 capsule (20 mg total) by mouth 2 (two) times daily before a meal., Disp: 180 capsule, Rfl: 3 .  potassium chloride (MICRO-K) 10 MEQ CR capsule, Take 10 mEq by mouth in the morning and at bedtime., Disp: , Rfl:  .  propranolol ER (INDERAL LA) 80 MG 24 hr capsule, Take 1 capsule (80 mg total) by mouth daily., Disp: 90 capsule, Rfl: 3 .  simvastatin (ZOCOR) 40 MG tablet, TAKE ONE TABLET BY MOUTH DAILY AT BEDTIME, Disp: 90 tablet, Rfl:  3 .  venetoclax 100 MG TABS, Take 200 mg by mouth daily., Disp: , Rfl:  No current facility-administered medications for this visit.  Facility-Administered Medications Ordered in Other Visits:  .  sodium chloride flush (NS) 0.9 % injection 10 mL, 10 mL, Intracatheter, PRN, Ladell Pier, MD, 10 mL at 11/03/20 1625 .  sodium chloride flush (NS) 0.9 % injection 10 mL, 10 mL, Intravenous, PRN, [place], Sarah M, NP, 10 mL at 02/20/21 0900  Allergies: No Known Allergies  Past Medical History, Surgical history, Social history, and Family History were reviewed and updated.  Review of Systems: Review of Systems  Constitutional: Negative.   HENT:  Negative.   Eyes: Negative.   Respiratory: Negative.   Cardiovascular: Negative.   Gastrointestinal: Negative.   Endocrine: Negative.   Genitourinary: Negative.    Musculoskeletal: Negative.   Skin: Negative.   Neurological: Negative.   Hematological: Negative.   Psychiatric/Behavioral: Negative.     Physical Exam:  vitals were not taken for this visit.   Wt Readings from Last 3 Encounters:  03/06/21 208 lb (94.3 kg)  02/09/21 197 lb (89.4 kg)  01/23/21 195 lb (88.5 kg)    Physical Exam Vitals reviewed.  HENT:     Head: Normocephalic and atraumatic.  Eyes:     Pupils: Pupils are equal, round, and reactive to light.  Cardiovascular:     Rate and Rhythm: Normal rate and regular rhythm.     Heart sounds: Normal heart sounds.  Pulmonary:     Effort: Pulmonary effort is normal.     Breath sounds: Normal breath sounds.  Abdominal:     General: Bowel sounds are normal.     Palpations: Abdomen is soft.  Musculoskeletal:        General: No tenderness or deformity. Normal range of motion.     Cervical back: Normal range of motion.  Lymphadenopathy:     Cervical: No cervical adenopathy.  Skin:    General: Skin is warm and dry.     Findings: No erythema or rash.  Neurological:     Mental Status: He is alert and oriented to  person, place, and time.  Psychiatric:        Behavior: Behavior normal.        Thought Content: Thought content normal.        Judgment: Judgment normal.      Lab Results  Component Value Date   WBC 3.4 (L) 02/27/2021   HGB 11.2 (L) 02/27/2021   HCT 33.8 (L) 02/27/2021   MCV 99.4 02/27/2021  PLT 50 (L) 02/27/2021     Chemistry      Component Value Date/Time   NA 142 02/27/2021 1015   NA 142 03/30/2015 1149   K 3.6 02/27/2021 1015   K 4.5 03/30/2015 1149   CL 107 02/27/2021 1015   CO2 28 02/27/2021 1015   CO2 25 03/30/2015 1149   BUN 19 02/27/2021 1015   BUN 17.1 03/30/2015 1149   CREATININE 0.92 02/27/2021 1015   CREATININE 1.2 03/30/2015 1149      Component Value Date/Time   CALCIUM 9.0 02/27/2021 1015   CALCIUM 8.7 03/30/2015 1149   ALKPHOS 58 02/27/2021 1015   AST 10 (L) 02/27/2021 1015   ALT 6 02/27/2021 1015   BILITOT 0.5 02/27/2021 1015       Impression and Plan: Mr. Plagge is a very nice 85 year old white male who has acute myeloid leukemia.  He does have a adverse prognostic feature with respect to the FLT3 mutation.  I know there are targeted therapies for patients who have leukemia with this FLT3 mutation.  Again, Dr. Linus Orn is doing a fantastic job with him.  He is responding well to the Vidaza and the venetoclax.  There is no indication that we have to put him on one of the new targeted therapy.  I would think that he will need another bone marrow biopsy at some point.  Maybe, he will get this over the summer.  His cycles are every 6 weeks.  This usually working out well for him.  We are checking his labs every week.  This also is appropriate and allows [place] to make adjustments with respect to transfusions.  We will plan to get him back to see [place] in another 6 weeks.  He will then be due for his next cycle of treatment.    Volanda Napoleon, MD 5/2/20229:39 AM

## 2021-03-06 NOTE — Patient Instructions (Signed)
Implanted Port Insertion, Care After This sheet gives you information about how to care for yourself after your procedure. Your health care provider may also give you more specific instructions. If you have problems or questions, contact your health care provider. What can I expect after the procedure? After the procedure, it is common to have:  Discomfort at the port insertion site.  Bruising on the skin over the port. This should improve over 3-4 days. Follow these instructions at home: Port care  After your port is placed, you will get a manufacturer's information card. The card has information about your port. Keep this card with you at all times.  Take care of the port as told by your health care provider. Ask your health care provider if you or a family member can get training for taking care of the port at home. A home health care nurse may also take care of the port.  Make sure to remember what type of port you have. Incision care  Follow instructions from your health care provider about how to take care of your port insertion site. Make sure you: ? Wash your hands with soap and water before and after you change your bandage (dressing). If soap and water are not available, use hand sanitizer. ? Change your dressing as told by your health care provider. ? Leave stitches (sutures), skin glue, or adhesive strips in place. These skin closures may need to stay in place for 2 weeks or longer. If adhesive strip edges start to loosen and curl up, you may trim the loose edges. Do not remove adhesive strips completely unless your health care provider tells you to do that.  Check your port insertion site every day for signs of infection. Check for: ? Redness, swelling, or pain. ? Fluid or blood. ? Warmth. ? Pus or a bad smell.      Activity  Return to your normal activities as told by your health care provider. Ask your health care provider what activities are safe for you.  Do not  lift anything that is heavier than 10 lb (4.5 kg), or the limit that you are told, until your health care provider says that it is safe. General instructions  Take over-the-counter and prescription medicines only as told by your health care provider.  Do not take baths, swim, or use a hot tub until your health care provider approves. Ask your health care provider if you may take showers. You may only be allowed to take sponge baths.  Do not drive for 24 hours if you were given a sedative during your procedure.  Wear a medical alert bracelet in case of an emergency. This will tell any health care providers that you have a port.  Keep all follow-up visits as told by your health care provider. This is important. Contact a health care provider if:  You cannot flush your port with saline as directed, or you cannot draw blood from the port.  You have a fever or chills.  You have redness, swelling, or pain around your port insertion site.  You have fluid or blood coming from your port insertion site.  Your port insertion site feels warm to the touch.  You have pus or a bad smell coming from the port insertion site. Get help right away if:  You have chest pain or shortness of breath.  You have bleeding from your port that you cannot control. Summary  Take care of the port as told by your   health care provider. Keep the manufacturer's information card with you at all times.  Change your dressing as told by your health care provider.  Contact a health care provider if you have a fever or chills or if you have redness, swelling, or pain around your port insertion site.  Keep all follow-up visits as told by your health care provider. This information is not intended to replace advice given to you by your health care provider. Make sure you discuss any questions you have with your health care provider. Document Revised: 05/20/2018 Document Reviewed: 05/20/2018 Elsevier Patient Education   2021 Elsevier Inc.  

## 2021-03-06 NOTE — Progress Notes (Signed)
Per Dr. [name], it's OK to treat with a platelet count of 41 today.

## 2021-03-07 ENCOUNTER — Telehealth: Payer: Self-pay

## 2021-03-07 ENCOUNTER — Inpatient Hospital Stay: Payer: Medicare Other

## 2021-03-07 ENCOUNTER — Ambulatory Visit: Payer: Medicare Other

## 2021-03-07 VITALS — BP 157/55 | HR 81 | Temp 97.6°F | Resp 20

## 2021-03-07 DIAGNOSIS — C9500 Acute leukemia of unspecified cell type not having achieved remission: Secondary | ICD-10-CM

## 2021-03-07 DIAGNOSIS — Z5111 Encounter for antineoplastic chemotherapy: Secondary | ICD-10-CM | POA: Diagnosis not present

## 2021-03-07 MED ORDER — SODIUM CHLORIDE 0.9 % IV SOLN
75.0000 mg/m2 | Freq: Once | INTRAVENOUS | Status: AC
  Administered 2021-03-07: 155 mg via INTRAVENOUS
  Filled 2021-03-07: qty 15.5

## 2021-03-07 MED ORDER — SODIUM CHLORIDE 0.9 % IV SOLN
10.0000 mg | Freq: Once | INTRAVENOUS | Status: AC
Start: 2021-03-07 — End: 2021-03-07
  Administered 2021-03-07: 10 mg via INTRAVENOUS
  Filled 2021-03-07: qty 10

## 2021-03-07 MED ORDER — SODIUM CHLORIDE 0.9% FLUSH
10.0000 mL | INTRAVENOUS | Status: DC | PRN
Start: 2021-03-07 — End: 2021-03-07
  Administered 2021-03-07: 10 mL
  Filled 2021-03-07: qty 10

## 2021-03-07 MED ORDER — HEPARIN SOD (PORK) LOCK FLUSH 100 UNIT/ML IV SOLN
500.0000 [IU] | Freq: Once | INTRAVENOUS | Status: AC | PRN
  Administered 2021-03-07: 500 [IU]
  Filled 2021-03-07: qty 5

## 2021-03-07 MED ORDER — SODIUM CHLORIDE 0.9 % IV SOLN
Freq: Once | INTRAVENOUS | Status: AC
  Filled 2021-03-07: qty 250

## 2021-03-07 NOTE — Telephone Encounter (Signed)
No 03/06/21 los   Victor [name]

## 2021-03-07 NOTE — Patient Instructions (Signed)
[facility]  Discharge Instructions: °Thank you for choosing [facility] to provide your oncology and hematology care.  ° °If you have a lab appointment with the Cancer Center, please go directly to the Cancer Center and check in at the registration area. ° °Wear comfortable clothing and clothing appropriate for easy access to any Portacath or PICC line.  ° °We strive to give you quality time with your provider. You may need to reschedule your appointment if you arrive late (15 or more minutes).  Arriving late affects you and other patients whose appointments are after yours.  Also, if you miss three or more appointments without notifying the office, you may be dismissed from the clinic at the provider’s discretion.    °  °For prescription refill requests, have your pharmacy contact our office and allow 72 hours for refills to be completed.   ° °Today you received the following chemotherapy and/or immunotherapy agents Vidaza °  °To help prevent nausea and vomiting after your treatment, we encourage you to take your nausea medication as directed. ° °BELOW ARE SYMPTOMS THAT SHOULD BE REPORTED IMMEDIATELY: °*FEVER GREATER THAN 100.4 F (38 °C) OR HIGHER °*CHILLS OR SWEATING °*NAUSEA AND VOMITING THAT IS NOT CONTROLLED WITH YOUR NAUSEA MEDICATION °*UNUSUAL SHORTNESS OF BREATH °*UNUSUAL BRUISING OR BLEEDING °*URINARY PROBLEMS (pain or burning when urinating, or frequent urination) °*BOWEL PROBLEMS (unusual diarrhea, constipation, pain near the anus) °TENDERNESS IN MOUTH AND THROAT WITH OR WITHOUT PRESENCE OF ULCERS (sore throat, sores in mouth, or a toothache) °UNUSUAL RASH, SWELLING OR PAIN  °UNUSUAL VAGINAL DISCHARGE OR ITCHING  ° °Items with * indicate a potential emergency and should be followed up as soon as possible or go to the Emergency Department if any problems should occur. ° °Please show the CHEMOTHERAPY ALERT CARD or IMMUNOTHERAPY ALERT CARD at check-in to the  Emergency Department and triage nurse. °Should you have questions after your visit or need to cancel or reschedule your appointment, please contact [facility]  [phone] and follow the prompts.  Office hours are 8:00 a.m. to 4:30 p.m. Monday - Friday. Please note that voicemails left after 4:00 p.m. may not be returned until the following business day.  We are closed weekends and major holidays. You have access to a nurse at all times for urgent questions. Please call the main number to the clinic [phone] and follow the prompts. ° °For any non-urgent questions, you may also contact your provider using MyChart. We now offer e-Visits for anyone 18 and older to request care online for non-urgent symptoms. For details visit [email or web address]. °  °Also download the MyChart app! Go to the app store, search "MyChart", open the app, select [facility], and log in with your MyChart username and password. ° °Due to Covid, a mask is required upon entering the hospital/clinic. If you do not have a mask, one will be given to you upon arrival. For doctor visits, patients may have 1 support person aged 18 or older with them. For treatment visits, patients cannot have anyone with them due to current Covid guidelines and our immunocompromised population.  °

## 2021-03-08 ENCOUNTER — Inpatient Hospital Stay: Payer: Medicare Other

## 2021-03-08 ENCOUNTER — Other Ambulatory Visit: Payer: Self-pay

## 2021-03-08 ENCOUNTER — Ambulatory Visit: Payer: Medicare Other

## 2021-03-08 VITALS — BP 102/86 | HR 75 | Temp 97.6°F | Resp 19

## 2021-03-08 DIAGNOSIS — C9500 Acute leukemia of unspecified cell type not having achieved remission: Secondary | ICD-10-CM

## 2021-03-08 DIAGNOSIS — Z5111 Encounter for antineoplastic chemotherapy: Secondary | ICD-10-CM | POA: Diagnosis not present

## 2021-03-08 MED ORDER — HEPARIN SOD (PORK) LOCK FLUSH 100 UNIT/ML IV SOLN
500.0000 [IU] | Freq: Once | INTRAVENOUS | Status: AC | PRN
  Administered 2021-03-08: 500 [IU]
  Filled 2021-03-08: qty 5

## 2021-03-08 MED ORDER — DEXAMETHASONE SODIUM PHOSPHATE 100 MG/10ML IJ SOLN
10.0000 mg | Freq: Once | INTRAMUSCULAR | Status: AC
  Administered 2021-03-08: 10 mg via INTRAVENOUS
  Filled 2021-03-08: qty 10

## 2021-03-08 MED ORDER — PALONOSETRON HCL INJECTION 0.25 MG/5ML
0.2500 mg | Freq: Once | INTRAVENOUS | Status: AC
  Administered 2021-03-08: 0.25 mg via INTRAVENOUS

## 2021-03-08 MED ORDER — SODIUM CHLORIDE 0.9 % IV SOLN
75.0000 mg/m2 | Freq: Once | INTRAVENOUS | Status: AC
  Administered 2021-03-08: 155 mg via INTRAVENOUS
  Filled 2021-03-08: qty 15.5

## 2021-03-08 MED ORDER — SODIUM CHLORIDE 0.9% FLUSH
10.0000 mL | INTRAVENOUS | Status: DC | PRN
  Administered 2021-03-08: 10 mL
  Filled 2021-03-08: qty 10

## 2021-03-08 MED ORDER — PALONOSETRON HCL INJECTION 0.25 MG/5ML
INTRAVENOUS | Status: AC
  Filled 2021-03-08: qty 5

## 2021-03-08 MED ORDER — SODIUM CHLORIDE 0.9 % IV SOLN
Freq: Once | INTRAVENOUS | Status: AC
  Filled 2021-03-08: qty 250

## 2021-03-08 NOTE — Patient Instructions (Addendum)
[facility] [place]  Discharge Instructions: Thank you for choosing [facility] to provide your oncology and hematology care.   If you have a lab appointment with the [facility], please go directly to the [facility] and check in at the registration area.  Wear comfortable clothing and clothing appropriate for easy access to any Portacath or PICC line.   We strive to give you quality time with your provider. You may need to reschedule your appointment if you arrive late (15 or more minutes).  Arriving late affects you and other patients whose appointments are after yours.  Also, if you miss three or more appointments without notifying the office, you may be dismissed from the clinic at the provider's discretion.      For prescription refill requests, have your pharmacy contact our office and allow 72 hours for refills to be completed.    Today you received the following chemotherapy and/or immunotherapy agents Beryle Flock    To help prevent nausea and vomiting after your treatment, we encourage you to take your nausea medication as directed.  BELOW ARE SYMPTOMS THAT SHOULD BE REPORTED IMMEDIATELY: . *FEVER GREATER THAN 100.4 F (38 C) OR HIGHER . *CHILLS OR SWEATING . *NAUSEA AND VOMITING THAT IS NOT CONTROLLED WITH YOUR NAUSEA MEDICATION . *UNUSUAL SHORTNESS OF BREATH . *UNUSUAL BRUISING OR BLEEDING . *URINARY PROBLEMS (pain or burning when urinating, or frequent urination) . *BOWEL PROBLEMS (unusual diarrhea, constipation, pain near the anus) . TENDERNESS IN MOUTH AND THROAT WITH OR WITHOUT PRESENCE OF ULCERS (sore throat, sores in mouth, or a toothache) . UNUSUAL RASH, SWELLING OR PAIN  . UNUSUAL VAGINAL DISCHARGE OR ITCHING   Items with * indicate a potential emergency and should be followed up as soon as possible or go to the Emergency Department if any problems should occur.  Please show the CHEMOTHERAPY ALERT CARD or IMMUNOTHERAPY ALERT CARD at  check-in to the Emergency Department and triage nurse. Should you have questions after your visit or need to cancel or reschedule your appointment, please contact [employer]  [phone] and follow the prompts.  Office hours are 8:00 a.m. to 4:30 p.m. Monday - Friday. Please note that voicemails left after 4:00 p.m. may not be returned until the following business day.  We are closed weekends and major holidays. You have access to a nurse at all times for urgent questions. Please call the main number to the clinic [phone] and follow the prompts.  For any non-urgent questions, you may also contact your provider using MyChart. We now offer e-Visits for anyone 82 and older to request care online for non-urgent symptoms. For details visit [email or web address].   Also download the MyChart app! Go to the app store, search "MyChart", open the app, select [facility], and log in with your MyChart username and password.  Due to Covid, a mask is required upon entering the hospital/clinic. If you do not have a mask, one will be given to you upon arrival. For doctor visits, patients may have 1 support person aged 5 or older with them. For treatment visits, patients cannot Pembrolizumab injection What is this medicine? PEMBROLIZUMAB (pem broe liz ue mab) is a monoclonal antibody. It is used to treat certain types of cancer. This medicine may be used for other purposes; ask your health care provider or pharmacist if you have questions. COMMON BRAND NAME(S): 5 azacytabine   What should I tell my health care provider before  I take this medicine? They need to know if you have any of these conditions:  autoimmune diseases like Crohn's disease, ulcerative colitis, or lupus  have had or planning to have an allogeneic stem cell transplant (uses someone else's stem cells)  history of organ transplant  history of chest radiation  nervous system problems like myasthenia gravis  or Guillain-Barre syndrome  an unusual or allergic reaction to pembrolizumab, other medicines, foods, dyes, or preservatives  pregnant or trying to get pregnant  breast-feeding How should I use this medicine? This medicine is for infusion into a vein. It is given by a health care professional in a hospital or clinic setting. A special MedGuide will be given to you before each treatment. Be sure to read this information carefully each time. Talk to your pediatrician regarding the use of this medicine in children. While this drug may be prescribed for children as young as 6 months for selected conditions, precautions do apply. Overdosage: If you think you have taken too much of this medicine contact a poison control center or emergency room at once. NOTE: This medicine is only for you. Do not share this medicine with others. What if I miss a dose? It is important not to miss your dose. Call your doctor or health care professional if you are unable to keep an appointment. What may interact with this medicine? Interactions have not been studied. This list may not describe all possible interactions. Give your health care provider a list of all the medicines, herbs, non-prescription drugs, or dietary supplements you use. Also tell them if you smoke, drink alcohol, or use illegal drugs. Some items may interact with your medicine. What should I watch for while using this medicine? Your condition will be monitored carefully while you are receiving this medicine. You may need blood work done while you are taking this medicine. Do not become pregnant while taking this medicine or for 4 months after stopping it. Women should inform their doctor if they wish to become pregnant or think they might be pregnant. There is a potential for serious side effects to an unborn child. Talk to your health care professional or pharmacist for more information. Do not breast-feed an infant while taking this medicine or for  4 months after the last dose. What side effects may I notice from receiving this medicine? Side effects that you should report to your doctor or health care professional as soon as possible:  allergic reactions like skin rash, itching or hives, swelling of the face, lips, or tongue  bloody or black, tarry  breathing problems  changes in vision  chest pain  chills  confusion  constipation  cough  diarrhea  dizziness or feeling faint or lightheaded  fast or irregular heartbeat  fever  flushing  joint pain  low blood counts - this medicine may decrease the number of white blood cells, red blood cells and platelets. You may be at increased risk for infections and bleeding.  muscle pain  muscle weakness  pain, tingling, numbness in the hands or feet  persistent headache  redness, blistering, peeling or loosening of the skin, including inside the mouth  signs and symptoms of high blood sugar such as dizziness; dry mouth; dry skin; fruity breath; nausea; stomach pain; increased hunger or thirst; increased urination  signs and symptoms of kidney injury like trouble passing urine or change in the amount of urine  signs and symptoms of liver injury like dark urine, light-colored stools, loss of appetite,  nausea, right upper belly pain, yellowing of the eyes or skin  sweating  swollen lymph nodes  weight loss Side effects that usually do not require medical attention (report to your doctor or health care professional if they continue or are bothersome):  decreased appetite  hair loss  tiredness This list may not describe all possible side effects. Call your doctor for medical advice about side effects. You may report side effects to FDA at 1-800-FDA-1088. Where should I keep my medicine? This drug is given in a hospital or clinic and will not be stored at home. NOTE: This sheet is a summary. It may not cover all possible information. If you have questions about  this medicine, talk to your doctor, pharmacist, or health care provider.  2021 Elsevier/Gold Standard (2019-09-23 21:44:53) have anyone with them due to current Covid guidelines and our immunocompromised population.

## 2021-03-09 ENCOUNTER — Inpatient Hospital Stay: Payer: Medicare Other

## 2021-03-09 ENCOUNTER — Ambulatory Visit: Payer: Medicare Other

## 2021-03-09 ENCOUNTER — Other Ambulatory Visit: Payer: Medicare Other

## 2021-03-09 VITALS — BP 150/64 | HR 70 | Temp 97.9°F | Resp 18

## 2021-03-09 DIAGNOSIS — C9500 Acute leukemia of unspecified cell type not having achieved remission: Secondary | ICD-10-CM

## 2021-03-09 DIAGNOSIS — Z5111 Encounter for antineoplastic chemotherapy: Secondary | ICD-10-CM | POA: Diagnosis not present

## 2021-03-09 LAB — CBC WITH DIFFERENTIAL (CANCER CENTER ONLY)
Abs Immature Granulocytes: 0.09 10*3/uL — ABNORMAL HIGH (ref 0.00–0.07)
Basophils Absolute: 0 10*3/uL (ref 0.0–0.1)
Basophils Relative: 0 %
Eosinophils Absolute: 0 10*3/uL (ref 0.0–0.5)
Eosinophils Relative: 0 %
HCT: 32.8 % — ABNORMAL LOW (ref 39.0–52.0)
Hemoglobin: 10.9 g/dL — ABNORMAL LOW (ref 13.0–17.0)
Immature Granulocytes: 2 %
Lymphocytes Relative: 10 %
Lymphs Abs: 0.6 10*3/uL — ABNORMAL LOW (ref 0.7–4.0)
MCH: 33.1 pg (ref 26.0–34.0)
MCHC: 33.2 g/dL (ref 30.0–36.0)
MCV: 99.7 fL (ref 80.0–100.0)
Monocytes Absolute: 0.3 10*3/uL (ref 0.1–1.0)
Monocytes Relative: 4 %
Neutro Abs: 5 10*3/uL (ref 1.7–7.7)
Neutrophils Relative %: 84 %
Platelet Count: 44 10*3/uL — ABNORMAL LOW (ref 150–400)
RBC: 3.29 MIL/uL — ABNORMAL LOW (ref 4.22–5.81)
RDW: 15.6 % — ABNORMAL HIGH (ref 11.5–15.5)
WBC Count: 5.9 10*3/uL (ref 4.0–10.5)
nRBC: 0.3 % — ABNORMAL HIGH (ref 0.0–0.2)

## 2021-03-09 LAB — CMP (CANCER CENTER ONLY)
ALT: 10 U/L (ref 0–44)
AST: 11 U/L — ABNORMAL LOW (ref 15–41)
Albumin: 3.8 g/dL (ref 3.5–5.0)
Alkaline Phosphatase: 49 U/L (ref 38–126)
Anion gap: 9 (ref 5–15)
BUN: 23 mg/dL (ref 8–23)
CO2: 26 mmol/L (ref 22–32)
Calcium: 8.6 mg/dL — ABNORMAL LOW (ref 8.9–10.3)
Chloride: 106 mmol/L (ref 98–111)
Creatinine: 0.9 mg/dL (ref 0.61–1.24)
GFR, Estimated: >60 mL/min (ref >60)
Glucose, Bld: 160 mg/dL — ABNORMAL HIGH (ref 70–99)
Potassium: 3.4 mmol/L — ABNORMAL LOW (ref 3.5–5.1)
Sodium: 141 mmol/L (ref 135–145)
Total Bilirubin: 0.5 mg/dL (ref 0.3–1.2)
Total Protein: 5.1 g/dL — ABNORMAL LOW (ref 6.5–8.1)

## 2021-03-09 MED ORDER — SODIUM CHLORIDE 0.9 % IV SOLN
Freq: Once | INTRAVENOUS | Status: AC
  Filled 2021-03-09: qty 250

## 2021-03-09 MED ORDER — SODIUM CHLORIDE 0.9 % IV SOLN
75.0000 mg/m2 | Freq: Once | INTRAVENOUS | Status: AC
  Administered 2021-03-09: 155 mg via INTRAVENOUS
  Filled 2021-03-09: qty 15.5

## 2021-03-09 MED ORDER — SODIUM CHLORIDE 0.9% FLUSH
10.0000 mL | INTRAVENOUS | Status: DC | PRN
  Administered 2021-03-09: 10 mL
  Filled 2021-03-09: qty 10

## 2021-03-09 MED ORDER — SODIUM CHLORIDE 0.9 % IV SOLN
10.0000 mg | Freq: Once | INTRAVENOUS | Status: AC
  Administered 2021-03-09: 10 mg via INTRAVENOUS
  Filled 2021-03-09: qty 10

## 2021-03-09 MED ORDER — HEPARIN SOD (PORK) LOCK FLUSH 100 UNIT/ML IV SOLN
500.0000 [IU] | Freq: Once | INTRAVENOUS | Status: AC | PRN
  Administered 2021-03-09: 500 [IU]
  Filled 2021-03-09: qty 5

## 2021-03-09 NOTE — Patient Instructions (Signed)
[facility] [place]  Discharge Instructions: °Thank you for choosing [facility] to provide your oncology and hematology care.  ° °If you have a lab appointment with the Cancer Center, please go directly to the Cancer Center and check in at the registration area. ° °Wear comfortable clothing and clothing appropriate for easy access to any Portacath or PICC line.  ° °We strive to give you quality time with your provider. You may need to reschedule your appointment if you arrive late (15 or more minutes).  Arriving late affects you and other patients whose appointments are after yours.  Also, if you miss three or more appointments without notifying the office, you may be dismissed from the clinic at the provider’s discretion.    °  °For prescription refill requests, have your pharmacy contact our office and allow 72 hours for refills to be completed.   ° °Today you received the following chemotherapy and/or immunotherapy agents Vidaza °  °To help prevent nausea and vomiting after your treatment, we encourage you to take your nausea medication as directed. ° °BELOW ARE SYMPTOMS THAT SHOULD BE REPORTED IMMEDIATELY: °*FEVER GREATER THAN 100.4 F (38 °C) OR HIGHER °*CHILLS OR SWEATING °*NAUSEA AND VOMITING THAT IS NOT CONTROLLED WITH YOUR NAUSEA MEDICATION °*UNUSUAL SHORTNESS OF BREATH °*UNUSUAL BRUISING OR BLEEDING °*URINARY PROBLEMS (pain or burning when urinating, or frequent urination) °*BOWEL PROBLEMS (unusual diarrhea, constipation, pain near the anus) °TENDERNESS IN MOUTH AND THROAT WITH OR WITHOUT PRESENCE OF ULCERS (sore throat, sores in mouth, or a toothache) °UNUSUAL RASH, SWELLING OR PAIN  °UNUSUAL VAGINAL DISCHARGE OR ITCHING  ° °Items with * indicate a potential emergency and should be followed up as soon as possible or go to the Emergency Department if any problems should occur. ° °Please show the CHEMOTHERAPY ALERT CARD or IMMUNOTHERAPY ALERT CARD at check-in to the  Emergency Department and triage nurse. °Should you have questions after your visit or need to cancel or reschedule your appointment, please contact [facility] [place]  [phone] and follow the prompts.  Office hours are 8:00 a.m. to 4:30 p.m. Monday - Friday. Please note that voicemails left after 4:00 p.m. may not be returned until the following business day.  We are closed weekends and major holidays. You have access to a nurse at all times for urgent questions. Please call the main number to the clinic [phone] and follow the prompts. ° °For any non-urgent questions, you may also contact your provider using MyChart. We now offer e-Visits for anyone 18 and older to request care online for non-urgent symptoms. For details visit [email or web address]. °  °Also download the MyChart app! Go to the app store, search "MyChart", open the app, select [facility], and log in with your MyChart username and password. ° °Due to Covid, a mask is required upon entering the hospital/clinic. If you do not have a mask, one will be given to you upon arrival. For doctor visits, patients may have 1 support person aged 18 or older with them. For treatment visits, patients cannot have anyone with them due to current Covid guidelines and our immunocompromised population.  °

## 2021-03-10 ENCOUNTER — Ambulatory Visit: Payer: Medicare Other

## 2021-03-10 ENCOUNTER — Inpatient Hospital Stay: Payer: Medicare Other

## 2021-03-10 ENCOUNTER — Other Ambulatory Visit: Payer: Self-pay

## 2021-03-10 VITALS — BP 157/81 | HR 81 | Temp 97.5°F | Resp 17

## 2021-03-10 DIAGNOSIS — C9500 Acute leukemia of unspecified cell type not having achieved remission: Secondary | ICD-10-CM

## 2021-03-10 DIAGNOSIS — Z5111 Encounter for antineoplastic chemotherapy: Secondary | ICD-10-CM | POA: Diagnosis not present

## 2021-03-10 MED ORDER — AZACITIDINE CHEMO INJECTION 100 MG
75.0000 mg/m2 | Freq: Once | INTRAMUSCULAR | Status: AC
  Administered 2021-03-10: 155 mg via INTRAVENOUS
  Filled 2021-03-10: qty 15.5

## 2021-03-10 MED ORDER — SODIUM CHLORIDE 0.9 % IV SOLN
10.0000 mg | Freq: Once | INTRAVENOUS | Status: AC
  Administered 2021-03-10: 10 mg via INTRAVENOUS
  Filled 2021-03-10: qty 10

## 2021-03-10 MED ORDER — HEPARIN SOD (PORK) LOCK FLUSH 100 UNIT/ML IV SOLN
500.0000 [IU] | Freq: Once | INTRAVENOUS | Status: AC | PRN
  Administered 2021-03-10: 500 [IU]
  Filled 2021-03-10: qty 5

## 2021-03-10 MED ORDER — PALONOSETRON HCL INJECTION 0.25 MG/5ML
0.2500 mg | Freq: Once | INTRAVENOUS | Status: AC
  Administered 2021-03-10: 0.25 mg via INTRAVENOUS

## 2021-03-10 MED ORDER — PALONOSETRON HCL INJECTION 0.25 MG/5ML
INTRAVENOUS | Status: AC
  Filled 2021-03-10: qty 5

## 2021-03-10 MED ORDER — SODIUM CHLORIDE 0.9 % IV SOLN
Freq: Once | INTRAVENOUS | Status: AC
  Filled 2021-03-10: qty 250

## 2021-03-10 MED ORDER — SODIUM CHLORIDE 0.9% FLUSH
10.0000 mL | INTRAVENOUS | Status: DC | PRN
  Administered 2021-03-10: 10 mL
  Filled 2021-03-10: qty 10

## 2021-03-10 NOTE — Patient Instructions (Signed)
[facility] [place]  Discharge Instructions: Thank you for choosing [facility] to provide your oncology and hematology care.   If you have a lab appointment with the [facility], please go directly to the [facility] and check in at the registration area.  Wear comfortable clothing and clothing appropriate for easy access to any Portacath or PICC line.   We strive to give you quality time with your provider. You may need to reschedule your appointment if you arrive late (15 or more minutes).  Arriving late affects you and other patients whose appointments are after yours.  Also, if you miss three or more appointments without notifying the office, you may be dismissed from the clinic at the provider's discretion.      For prescription refill requests, have your pharmacy contact our office and allow 72 hours for refills to be completed.    Today you received the following chemotherapy and/or immunotherapy agents: Vidaza    To help prevent nausea and vomiting after your treatment, we encourage you to take your nausea medication as directed.  BELOW ARE SYMPTOMS THAT SHOULD BE REPORTED IMMEDIATELY: . *FEVER GREATER THAN 100.4 F (38 C) OR HIGHER . *CHILLS OR SWEATING . *NAUSEA AND VOMITING THAT IS NOT CONTROLLED WITH YOUR NAUSEA MEDICATION . *UNUSUAL SHORTNESS OF BREATH . *UNUSUAL BRUISING OR BLEEDING . *URINARY PROBLEMS (pain or burning when urinating, or frequent urination) . *BOWEL PROBLEMS (unusual diarrhea, constipation, pain near the anus) . TENDERNESS IN MOUTH AND THROAT WITH OR WITHOUT PRESENCE OF ULCERS (sore throat, sores in mouth, or a toothache) . UNUSUAL RASH, SWELLING OR PAIN  . UNUSUAL VAGINAL DISCHARGE OR ITCHING   Items with * indicate a potential emergency and should be followed up as soon as possible or go to the Emergency Department if any problems should occur.  Please show the CHEMOTHERAPY ALERT CARD or IMMUNOTHERAPY ALERT CARD at  check-in to the Emergency Department and triage nurse. Should you have questions after your visit or need to cancel or reschedule your appointment, please contact [facility]  [phone] and follow the prompts.  Office hours are 8:00 a.m. to 4:30 p.m. Monday - Friday. Please note that voicemails left after 4:00 p.m. may not be returned until the following business day.  We are closed weekends and major holidays. You have access to a nurse at all times for urgent questions. Please call the main number to the clinic [phone] and follow the prompts.  For any non-urgent questions, you may also contact your provider using MyChart. We now offer e-Visits for anyone 83 and older to request care online for non-urgent symptoms. For details visit [email or web address].   Also download the MyChart app! Go to the app store, search "MyChart", open the app, select [facility], and log in with your MyChart username and password.  Due to Covid, a mask is required upon entering the hospital/clinic. If you do not have a mask, one will be given to you upon arrival. For doctor visits, patients may have 1 support person aged 85 or older with them. For treatment visits, patients cannot Pembrolizumab injection What is this medicine?

## 2021-03-13 ENCOUNTER — Other Ambulatory Visit: Payer: Self-pay

## 2021-03-13 ENCOUNTER — Inpatient Hospital Stay: Payer: Medicare Other

## 2021-03-13 VITALS — BP 128/65 | HR 74 | Temp 98.0°F | Resp 17

## 2021-03-13 DIAGNOSIS — Z95828 Presence of other vascular implants and grafts: Secondary | ICD-10-CM

## 2021-03-13 DIAGNOSIS — I2699 Other pulmonary embolism without acute cor pulmonale: Secondary | ICD-10-CM | POA: Diagnosis not present

## 2021-03-13 DIAGNOSIS — C9500 Acute leukemia of unspecified cell type not having achieved remission: Secondary | ICD-10-CM

## 2021-03-13 LAB — CMP (CANCER CENTER ONLY)
ALT: 11 U/L (ref 0–44)
AST: 9 U/L — ABNORMAL LOW (ref 15–41)
Albumin: 3.4 g/dL — ABNORMAL LOW (ref 3.5–5.0)
Alkaline Phosphatase: 46 U/L (ref 38–126)
Anion gap: 5 (ref 5–15)
BUN: 23 mg/dL (ref 8–23)
CO2: 28 mmol/L (ref 22–32)
Calcium: 8.4 mg/dL — ABNORMAL LOW (ref 8.9–10.3)
Chloride: 104 mmol/L (ref 98–111)
Creatinine: 0.98 mg/dL (ref 0.61–1.24)
GFR, Estimated: >60 mL/min (ref >60)
Glucose, Bld: 113 mg/dL — ABNORMAL HIGH (ref 70–99)
Potassium: 3.6 mmol/L (ref 3.5–5.1)
Sodium: 137 mmol/L (ref 135–145)
Total Bilirubin: 0.7 mg/dL (ref 0.3–1.2)
Total Protein: 4.6 g/dL — ABNORMAL LOW (ref 6.5–8.1)

## 2021-03-13 LAB — CBC WITH DIFFERENTIAL (CANCER CENTER ONLY)
Abs Immature Granulocytes: 0.08 10*3/uL — ABNORMAL HIGH (ref 0.00–0.07)
Basophils Absolute: 0 10*3/uL (ref 0.0–0.1)
Basophils Relative: 0 %
Eosinophils Absolute: 0 10*3/uL (ref 0.0–0.5)
Eosinophils Relative: 0 %
HCT: 33.9 % — ABNORMAL LOW (ref 39.0–52.0)
Hemoglobin: 11.3 g/dL — ABNORMAL LOW (ref 13.0–17.0)
Immature Granulocytes: 2 %
Lymphocytes Relative: 16 %
Lymphs Abs: 0.8 10*3/uL (ref 0.7–4.0)
MCH: 32.9 pg (ref 26.0–34.0)
MCHC: 33.3 g/dL (ref 30.0–36.0)
MCV: 98.8 fL (ref 80.0–100.0)
Monocytes Absolute: 0.3 10*3/uL (ref 0.1–1.0)
Monocytes Relative: 6 %
Neutro Abs: 3.5 10*3/uL (ref 1.7–7.7)
Neutrophils Relative %: 76 %
Platelet Count: 35 10*3/uL — ABNORMAL LOW (ref 150–400)
RBC: 3.43 MIL/uL — ABNORMAL LOW (ref 4.22–5.81)
RDW: 15.8 % — ABNORMAL HIGH (ref 11.5–15.5)
WBC Count: 4.6 10*3/uL (ref 4.0–10.5)
nRBC: 0 % (ref 0.0–0.2)

## 2021-03-13 MED ORDER — SODIUM CHLORIDE 0.9% FLUSH
10.0000 mL | Freq: Once | INTRAVENOUS | Status: AC
Start: 2021-03-13 — End: 2021-03-13
  Administered 2021-03-13: 10 mL via INTRAVENOUS
  Filled 2021-03-13: qty 10

## 2021-03-13 MED ORDER — HEPARIN SOD (PORK) LOCK FLUSH 100 UNIT/ML IV SOLN
500.0000 [IU] | Freq: Once | INTRAVENOUS | Status: AC
  Administered 2021-03-13: 500 [IU] via INTRAVENOUS
  Filled 2021-03-13: qty 5

## 2021-03-13 NOTE — Patient Instructions (Signed)
Implanted Port Insertion, Care After This sheet gives you information about how to care for yourself after your procedure. Your health care provider may also give you more specific instructions. If you have problems or questions, contact your health care provider. What can I expect after the procedure? After the procedure, it is common to have:  Discomfort at the port insertion site.  Bruising on the skin over the port. This should improve over 3-4 days. Follow these instructions at home: Port care  After your port is placed, you will get a manufacturer's information card. The card has information about your port. Keep this card with you at all times.  Take care of the port as told by your health care provider. Ask your health care provider if you or a family member can get training for taking care of the port at home. A home health care nurse may also take care of the port.  Make sure to remember what type of port you have. Incision care  Follow instructions from your health care provider about how to take care of your port insertion site. Make sure you: ? Wash your hands with soap and water before and after you change your bandage (dressing). If soap and water are not available, use hand sanitizer. ? Change your dressing as told by your health care provider. ? Leave stitches (sutures), skin glue, or adhesive strips in place. These skin closures may need to stay in place for 2 weeks or longer. If adhesive strip edges start to loosen and curl up, you may trim the loose edges. Do not remove adhesive strips completely unless your health care provider tells you to do that.  Check your port insertion site every day for signs of infection. Check for: ? Redness, swelling, or pain. ? Fluid or blood. ? Warmth. ? Pus or a bad smell.      Activity  Return to your normal activities as told by your health care provider. Ask your health care provider what activities are safe for you.  Do not  lift anything that is heavier than 10 lb (4.5 kg), or the limit that you are told, until your health care provider says that it is safe. General instructions  Take over-the-counter and prescription medicines only as told by your health care provider.  Do not take baths, swim, or use a hot tub until your health care provider approves. Ask your health care provider if you may take showers. You may only be allowed to take sponge baths.  Do not drive for 24 hours if you were given a sedative during your procedure.  Wear a medical alert bracelet in case of an emergency. This will tell any health care providers that you have a port.  Keep all follow-up visits as told by your health care provider. This is important. Contact a health care provider if:  You cannot flush your port with saline as directed, or you cannot draw blood from the port.  You have a fever or chills.  You have redness, swelling, or pain around your port insertion site.  You have fluid or blood coming from your port insertion site.  Your port insertion site feels warm to the touch.  You have pus or a bad smell coming from the port insertion site. Get help right away if:  You have chest pain or shortness of breath.  You have bleeding from your port that you cannot control. Summary  Take care of the port as told by your   health care provider. Keep the manufacturer's information card with you at all times.  Change your dressing as told by your health care provider.  Contact a health care provider if you have a fever or chills or if you have redness, swelling, or pain around your port insertion site.  Keep all follow-up visits as told by your health care provider. This information is not intended to replace advice given to you by your health care provider. Make sure you discuss any questions you have with your health care provider. Document Revised: 05/20/2018 Document Reviewed: 05/20/2018 Elsevier Patient Education   2021 Elsevier Inc.  

## 2021-03-15 ENCOUNTER — Inpatient Hospital Stay (HOSPITAL_BASED_OUTPATIENT_CLINIC_OR_DEPARTMENT_OTHER)
Admission: EM | Admit: 2021-03-15 | Discharge: 2021-03-18 | DRG: 176 | Disposition: A | Discharge status: Home / Self Care | Payer: Medicare Other | Attending: Internal Medicine | Admitting: Internal Medicine

## 2021-03-15 ENCOUNTER — Other Ambulatory Visit: Payer: Self-pay

## 2021-03-15 ENCOUNTER — Emergency Department (HOSPITAL_BASED_OUTPATIENT_CLINIC_OR_DEPARTMENT_OTHER): Payer: Medicare Other

## 2021-03-15 ENCOUNTER — Encounter (HOSPITAL_BASED_OUTPATIENT_CLINIC_OR_DEPARTMENT_OTHER): Payer: Self-pay | Admitting: Emergency Medicine

## 2021-03-15 DIAGNOSIS — C9200 Acute myeloblastic leukemia, not having achieved remission: Secondary | ICD-10-CM | POA: Diagnosis present

## 2021-03-15 DIAGNOSIS — R609 Edema, unspecified: Secondary | ICD-10-CM | POA: Diagnosis not present

## 2021-03-15 DIAGNOSIS — I82411 Acute embolism and thrombosis of right femoral vein: Secondary | ICD-10-CM | POA: Diagnosis present

## 2021-03-15 DIAGNOSIS — I70202 Unspecified atherosclerosis of native arteries of extremities, left leg: Secondary | ICD-10-CM | POA: Diagnosis present

## 2021-03-15 DIAGNOSIS — Z79890 Hormone replacement therapy (postmenopausal): Secondary | ICD-10-CM

## 2021-03-15 DIAGNOSIS — I2699 Other pulmonary embolism without acute cor pulmonale: Secondary | ICD-10-CM | POA: Diagnosis present

## 2021-03-15 DIAGNOSIS — M545 Low back pain, unspecified: Secondary | ICD-10-CM | POA: Diagnosis present

## 2021-03-15 DIAGNOSIS — E039 Hypothyroidism, unspecified: Secondary | ICD-10-CM | POA: Diagnosis present

## 2021-03-15 DIAGNOSIS — Z79899 Other long term (current) drug therapy: Secondary | ICD-10-CM

## 2021-03-15 DIAGNOSIS — I10 Essential (primary) hypertension: Secondary | ICD-10-CM | POA: Diagnosis present

## 2021-03-15 DIAGNOSIS — Z8521 Personal history of malignant neoplasm of larynx: Secondary | ICD-10-CM | POA: Diagnosis not present

## 2021-03-15 DIAGNOSIS — I2602 Saddle embolus of pulmonary artery with acute cor pulmonale: Secondary | ICD-10-CM | POA: Diagnosis not present

## 2021-03-15 DIAGNOSIS — Z20822 Contact with and (suspected) exposure to covid-19: Secondary | ICD-10-CM | POA: Diagnosis present

## 2021-03-15 DIAGNOSIS — Z95828 Presence of other vascular implants and grafts: Secondary | ICD-10-CM | POA: Diagnosis not present

## 2021-03-15 DIAGNOSIS — E785 Hyperlipidemia, unspecified: Secondary | ICD-10-CM | POA: Diagnosis present

## 2021-03-15 DIAGNOSIS — D696 Thrombocytopenia, unspecified: Secondary | ICD-10-CM | POA: Diagnosis present

## 2021-03-15 DIAGNOSIS — J432 Centrilobular emphysema: Secondary | ICD-10-CM | POA: Diagnosis present

## 2021-03-15 DIAGNOSIS — Z800 Family history of malignant neoplasm of digestive organs: Secondary | ICD-10-CM

## 2021-03-15 DIAGNOSIS — I6529 Occlusion and stenosis of unspecified carotid artery: Secondary | ICD-10-CM | POA: Diagnosis present

## 2021-03-15 DIAGNOSIS — I82401 Acute embolism and thrombosis of unspecified deep veins of right lower extremity: Secondary | ICD-10-CM | POA: Diagnosis not present

## 2021-03-15 DIAGNOSIS — I82431 Acute embolism and thrombosis of right popliteal vein: Secondary | ICD-10-CM | POA: Diagnosis present

## 2021-03-15 DIAGNOSIS — Z87891 Personal history of nicotine dependence: Secondary | ICD-10-CM | POA: Diagnosis not present

## 2021-03-15 DIAGNOSIS — D6959 Other secondary thrombocytopenia: Secondary | ICD-10-CM | POA: Diagnosis present

## 2021-03-15 DIAGNOSIS — C9500 Acute leukemia of unspecified cell type not having achieved remission: Secondary | ICD-10-CM | POA: Diagnosis not present

## 2021-03-15 DIAGNOSIS — I2693 Single subsegmental pulmonary embolism without acute cor pulmonale: Secondary | ICD-10-CM

## 2021-03-15 DIAGNOSIS — Z95820 Peripheral vascular angioplasty status with implants and grafts: Secondary | ICD-10-CM | POA: Diagnosis not present

## 2021-03-15 DIAGNOSIS — K219 Gastro-esophageal reflux disease without esophagitis: Secondary | ICD-10-CM | POA: Diagnosis present

## 2021-03-15 DIAGNOSIS — C9501 Acute leukemia of unspecified cell type, in remission: Secondary | ICD-10-CM

## 2021-03-15 DIAGNOSIS — C969 Malignant neoplasm of lymphoid, hematopoietic and related tissue, unspecified: Secondary | ICD-10-CM

## 2021-03-15 DIAGNOSIS — Z923 Personal history of irradiation: Secondary | ICD-10-CM

## 2021-03-15 DIAGNOSIS — R600 Localized edema: Secondary | ICD-10-CM | POA: Diagnosis present

## 2021-03-15 DIAGNOSIS — Z85850 Personal history of malignant neoplasm of thyroid: Secondary | ICD-10-CM

## 2021-03-15 DIAGNOSIS — Z86008 Personal history of in-situ neoplasm of other site: Secondary | ICD-10-CM | POA: Diagnosis not present

## 2021-03-15 DIAGNOSIS — I2609 Other pulmonary embolism with acute cor pulmonale: Secondary | ICD-10-CM | POA: Diagnosis not present

## 2021-03-15 DIAGNOSIS — I2729 Other secondary pulmonary hypertension: Secondary | ICD-10-CM | POA: Diagnosis not present

## 2021-03-15 LAB — CBC WITH DIFFERENTIAL/PLATELET
Abs Immature Granulocytes: 0.06 10*3/uL (ref 0.00–0.07)
Basophils Absolute: 0 10*3/uL (ref 0.0–0.1)
Basophils Relative: 0 %
Eosinophils Absolute: 0 10*3/uL (ref 0.0–0.5)
Eosinophils Relative: 0 %
HCT: 35.7 % — ABNORMAL LOW (ref 39.0–52.0)
Hemoglobin: 11.7 g/dL — ABNORMAL LOW (ref 13.0–17.0)
Immature Granulocytes: 1 %
Lymphocytes Relative: 18 %
Lymphs Abs: 0.8 10*3/uL (ref 0.7–4.0)
MCH: 33.3 pg (ref 26.0–34.0)
MCHC: 32.8 g/dL (ref 30.0–36.0)
MCV: 101.7 fL — ABNORMAL HIGH (ref 80.0–100.0)
Monocytes Absolute: 0.2 10*3/uL (ref 0.1–1.0)
Monocytes Relative: 6 %
Neutro Abs: 3.1 10*3/uL (ref 1.7–7.7)
Neutrophils Relative %: 75 %
Platelets: 26 10*3/uL — CL (ref 150–400)
RBC: 3.51 MIL/uL — ABNORMAL LOW (ref 4.22–5.81)
RDW: 15.9 % — ABNORMAL HIGH (ref 11.5–15.5)
WBC: 4.1 10*3/uL (ref 4.0–10.5)
nRBC: 0 % (ref 0.0–0.2)

## 2021-03-15 LAB — URINALYSIS, ROUTINE W REFLEX MICROSCOPIC
Bilirubin Urine: NEGATIVE
Glucose, UA: NEGATIVE mg/dL
Ketones, ur: NEGATIVE mg/dL
Leukocytes,Ua: NEGATIVE
Nitrite: NEGATIVE
Protein, ur: NEGATIVE mg/dL
Specific Gravity, Urine: 1.01 (ref 1.005–1.030)
pH: 6 (ref 5.0–8.0)

## 2021-03-15 LAB — COMPREHENSIVE METABOLIC PANEL
ALT: 15 U/L (ref 0–44)
AST: 13 U/L — ABNORMAL LOW (ref 15–41)
Albumin: 3.2 g/dL — ABNORMAL LOW (ref 3.5–5.0)
Alkaline Phosphatase: 47 U/L (ref 38–126)
Anion gap: 7 (ref 5–15)
BUN: 17 mg/dL (ref 8–23)
CO2: 28 mmol/L (ref 22–32)
Calcium: 8.2 mg/dL — ABNORMAL LOW (ref 8.9–10.3)
Chloride: 103 mmol/L (ref 98–111)
Creatinine, Ser: 1.01 mg/dL (ref 0.61–1.24)
GFR, Estimated: >60 mL/min (ref >60)
Glucose, Bld: 97 mg/dL (ref 70–99)
Potassium: 3.8 mmol/L (ref 3.5–5.1)
Sodium: 138 mmol/L (ref 135–145)
Total Bilirubin: 0.8 mg/dL (ref 0.3–1.2)
Total Protein: 5.6 g/dL — ABNORMAL LOW (ref 6.5–8.1)

## 2021-03-15 LAB — RESP PANEL BY RT-PCR (FLU A&B, COVID) ARPGX2
Influenza A by PCR: NEGATIVE
Influenza B by PCR: NEGATIVE
SARS Coronavirus 2 by RT PCR: NEGATIVE

## 2021-03-15 LAB — D-DIMER, QUANTITATIVE: D-Dimer, Quant: 2.06 ug/mL-FEU — ABNORMAL HIGH (ref 0.00–0.50)

## 2021-03-15 LAB — URINALYSIS, MICROSCOPIC (REFLEX)

## 2021-03-15 LAB — TROPONIN I (HIGH SENSITIVITY)
Troponin I (High Sensitivity): 5 ng/L (ref <18)
Troponin I (High Sensitivity): 5 ng/L (ref <18)

## 2021-03-15 MED ORDER — IOHEXOL 350 MG/ML SOLN
100.0000 mL | Freq: Once | INTRAVENOUS | Status: AC | PRN
  Administered 2021-03-15: 100 mL via INTRAVENOUS

## 2021-03-15 MED ORDER — HEPARIN (PORCINE) 25000 UT/250ML-% IV SOLN
1500.0000 [IU]/h | INTRAVENOUS | Status: DC
  Administered 2021-03-15: 1500 [IU]/h via INTRAVENOUS
  Filled 2021-03-15 (×2): qty 250

## 2021-03-15 NOTE — ED Triage Notes (Signed)
Right sided Middle back pain x 2 days.  No known injuries.  No strenuous activity.

## 2021-03-15 NOTE — ED Notes (Signed)
Patient transported to CT 

## 2021-03-15 NOTE — Progress Notes (Signed)
ANTICOAGULATION CONSULT NOTE - Initial Consult  Pharmacy Consult for heparin Indication: pulmonary embolus  No Known Allergies  Patient Measurements: Height: 5\' 10"  (177.8 cm) Weight: 94.3 kg (208 lb) IBW/kg (Calculated) : 73 Heparin Dosing Weight: 92 KG  Vital Signs: Temp: 97.9 F (36.6 C) (05/11 1320) Temp Source: Oral (05/11 1320) BP: 119/65 (05/11 1623) Pulse Rate: 73 (05/11 1623)  Labs: Recent Labs    03/13/21 1132 03/15/21 1149  HGB 11.3* 11.7*  HCT 33.9* 35.7*  PLT 35* 26*  CREATININE 0.98 1.01    Estimated Creatinine Clearance: 63.9 mL/min (by C-G formula based on SCr of 1.01 mg/dL).   Medical History: Past Medical History:  Diagnosis Date  . Arthritis   . Atherosclerosis   . Atherosclerotic PVD with intermittent claudication (HCC)    stent left leg  . BPH (benign prostatic hyperplasia)   . Bulging lumbar disc   . Cancer ([facility]) 11/12/14   vocal cord  carcinoma in situ , radiation; thyroid cancer  . Carotid bruit   . Chronic kidney disease    chronic stage III  . COPD (chronic obstructive pulmonary disease) ([facility])   . DDD (degenerative disc disease), lumbar   . Dysphonia   . Dysplasia of true vocal cord   . GERD (gastroesophageal reflux disease)   . H/O carotid atherosclerosis    b/l  . Hyperlipidemia   . Hypothyroidism   . Occasional tremors    left hand managed with propranolol  . Peripheral vascular angioplasty status with implants and grafts   . Precancerous lesion 03/05/2018   premelanoma removed from back.   . Radiation 03/10/15- 04/18/16   Larynx  . Sleep apnea    hasn't used CPAP in years  . Spondylosis of lumbosacral region   . Thyroid disease     Medications:   Assessment: 6 yom with history of acute myeloid leukemia, COPD, CKD stage III, GERD, HLD, vocal cord/tyroid cancer. Patient presents with sharp pain upon inhalation and coughing. CT positive for PE with right heart strain. Current Hgb 11.7, Plt 26, and Scr 1.01. Will not bolus  patient per heme/onc.   Goal of Therapy:  Heparin level 0.3-0.7 units/ml Monitor platelets by anticoagulation protocol: Yes   Plan:  Heparin 1500 units/hr  8-hour heparin level, target closer to 0.5 due to thrombocytopenia  Daily HL and CBC  Monitor for s/sx of bleed    Cephus Slater, PharmD, [facility] Pharmacy Resident [phone] 03/15/2021 5:02 PM

## 2021-03-15 NOTE — ED Provider Notes (Signed)
[facility] EMERGENCY DEPARTMENT Provider Note   CSN: [id number] Arrival date & time: 03/15/21  1034     History Chief Complaint  Patient presents with  . Back Pain    RICKIE GANGE is a 85 y.o. male.  HPI      BROOX LONIGRO is a 85 y.o. male, with a history of acute myeloid leukemia, COPD, CKD stage III, GERD, hyperlipidemia, vocal cord and thyroid cancer, presenting to the ED with back pain for the last 2 days.  Patient endorses pain in the right mid back beginning 2 days ago.  He describes it as an aching, sometimes sharp, worse with taking a deep breath.  He also states the pain is waxing and waning. He states he has had a cough for the last week that "feels like there is something to cough up but unable to do so." He endorses some mild lower extremity edema bilaterally, but states this has been present for several months. Denies fever/chills, chest pain, hemoptysis, abdominal pain, dysuria, hematuria, difficulty urinating, dizziness, syncope, acute lower extremity pain/swelling, weakness, numbness, or any other complaints.   Past Medical History:  Diagnosis Date  . Arthritis   . Atherosclerosis   . Atherosclerotic PVD with intermittent claudication (HCC)    stent left leg  . BPH (benign prostatic hyperplasia)   . Bulging lumbar disc   . Cancer ([place]) 11/12/14   vocal cord  carcinoma in situ , radiation; thyroid cancer  . Carotid bruit   . Chronic kidney disease    chronic stage III  . COPD (chronic obstructive pulmonary disease) ([place])   . DDD (degenerative disc disease), lumbar   . Dysphonia   . Dysplasia of true vocal cord   . GERD (gastroesophageal reflux disease)   . H/O carotid atherosclerosis    b/l  . Hyperlipidemia   . Hypothyroidism   . Occasional tremors    left hand managed with propranolol  . Peripheral vascular angioplasty status with implants and grafts   . Precancerous lesion 03/05/2018   premelanoma removed from back.   . Radiation  03/10/15- 04/18/16   Larynx  . Sleep apnea    hasn't used CPAP in years  . Spondylosis of lumbosacral region   . Thyroid disease     Patient Active Problem List   Diagnosis Date Noted  . Pulmonary embolism ([place]) 03/15/2021  . Closed fracture of one rib of right side 10/04/2020  . Goals of care, counseling/discussion 09/05/2020  . PICC (peripherally inserted central catheter) in place 08/22/2020  . 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 Thrombocytopenia ([place]) 08/05/2020  . Anemia 08/05/2020  . AKI (acute kidney injury) ([place]) 08/05/2020  . Hematologic malignancy ([place]) 08/05/2020  . Acute leukemia ([place]) 08/05/2020  . Hypothyroidism 08/05/2020  . Malaise and fatigue 08/04/2020  . Obesity (BMI 30-39.9) 08/04/2020  . COPD, moderate ([place]) 05/24/2020  . Bilateral hip pain 05/04/2020  . Plantar fasciitis of left foot 03/30/2020  . Chronic left SI joint pain 03/01/2020  . Shortness of breath 10/01/2019  . Former smoker 10/01/2019  . Healthcare maintenance 10/01/2019  . Epiretinal membrane (ERM) of left eye 07/22/2019  . Centrilobular emphysema ([place]) 07/02/2019  . Pharyngeal dysphagia 04/07/2019  . Cervical radiculopathy 03/16/2019  . 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 Lumbar radiculopathy 04/16/2017  . Rotator cuff syndrome of left shoulder 04/16/2017  . Trochanteric bursitis, left hip 04/16/2017  . Atherosclerosis of artery of both lower extremities ([place]) 11/14/2016  . Bilateral carotid artery stenosis  11/14/2016  . Vitamin D deficiency 07/31/2016  . Facet arthritis of lumbar region 09/26/2015  . 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  . History of thyroid cancer 03/26/2014  . Benign prostatic hyperplasia with urinary obstruction 03/23/2014  . Dysphonia 03/17/2014  . Gastro-esophageal reflux disease without esophagitis 03/17/2014  . Hyperlipidemia 03/17/2014  . Other intervertebral disc degeneration, lumbar region 03/17/2014  . PAD (peripheral artery disease) ([place])  03/17/2014  . Postoperative hypothyroidism 03/17/2014  . Chronic kidney disease, stage 2 (mild) 03/17/2014  . Occasional tremors 03/17/2014    Past Surgical History:  Procedure Laterality Date  . APPENDECTOMY    . COLONOSCOPY W/ POLYPECTOMY    . DIODE LASER APPLICATION Left 16/08/9603   Procedure: DIODE LASER APPLICATION;  Surgeon: Hayden Pedro, MD;  Location: [facility];  Service: Ophthalmology;  Laterality: Left;  . IR IMAGING GUIDED PORT INSERTION  01/16/2021  . MEMBRANE PEEL Left 08/18/2019   Procedure: MEMBRANE PEEL;  Surgeon: Hayden Pedro, MD;  Location: [facility];  Service: Ophthalmology;  Laterality: Left;  . MOUTH SURGERY     tooth extraction  . PARS PLANA VITRECTOMY Left 08/18/2019   PARS PLANA VITRECTOMY 27 GAUGE (Left)  . PARS PLANA VITRECTOMY 27 GAUGE Left 08/18/2019   Procedure: PARS PLANA VITRECTOMY 27 GAUGE;  Surgeon: Hayden Pedro, MD;  Location: [facility];  Service: Ophthalmology;  Laterality: Left;  . PHOTOCOAGULATION WITH LASER Left 08/18/2019   Procedure: PHOTOCOAGULATION WITH LASER;  Surgeon: Hayden Pedro, MD;  Location: [facility];  Service: Ophthalmology;  Laterality: Left;  . THYROID SURGERY    . TONSILLECTOMY    . vocal cord biopsy  11/12/14   squamous cell carcinoma in situ       Family History  Problem Relation Age of Onset  . Colon cancer Father   . Cancer Sister        Died of metastatic cancer  . Leukemia Neg Hx     Social History   Tobacco Use  . Smoking status: Former Smoker    Packs/day: 2.00    Years: 51.00    Pack years: 102.00    Types: Cigarettes    Start date: 32    Quit date: 03/05/2004    Years since quitting: 17.0  . Smokeless tobacco: Never Used  Vaping Use  . Vaping Use: Never used  Substance Use Topics  . Alcohol use: Yes    Alcohol/week: 7.0 standard drinks    Types: 7 Standard drinks or equivalent per week    Comment: 1 drink of rum per day  . Drug use: No    Home Medications Prior to Admission medications    Medication Sig Start Date End Date Taking? Authorizing Provider  acetaminophen (TYLENOL) 500 MG tablet Take 1,000 mg by mouth every 6 (six) hours as needed. 08/23/20   [provider]  acyclovir (ZOVIRAX) 400 MG tablet Take 1 tablet (400 mg total) by mouth 2 (two) times daily as needed. Patient not taking: No sig reported 11/24/20   Owens Shark, NP  amoxicillin (AMOXIL) 500 MG capsule Take 4 capsules by mouth one hour prior to dental procedure for prophylaxis given indwelling catheter. Patient not taking: No sig reported 12/08/20   [provider]  doxazosin (CARDURA) 2 MG tablet Take 1 tablet (2 mg total) by mouth at bedtime. 02/26/20   Cirigliano, Garvin Fila, DO  fluconazole (DIFLUCAN) 200 MG tablet Take 200 mg by mouth daily. Patient not taking: Reported on 01/23/2021 12/23/20  [provider]  fluticasone (FLONASE) 50 MCG/ACT nasal spray Place 2 sprays into the nose 2 (two) times daily as needed.    [provider]  HEMP OIL-VANILLYL BUTYL ETHER EX Apply topically daily. Patient not taking: Reported on 01/23/2021    [provider]  hydrALAZINE (APRESOLINE) 25 MG tablet Take 1 tablet (25 mg total) by mouth 3 (three) times daily as needed. Take if standing BP >140/80 mm Hg Patient not taking: No sig reported 12/05/20 03/05/21  [name], MD  HYDROcodone-acetaminophen (NORCO) 5-325 MG tablet Take 1 tablet by mouth every 6 (six) hours as needed for moderate pain. 09/09/20   Owens Shark, NP  ibuprofen (ADVIL) 200 MG tablet Take 400 mg by mouth every 6 (six) hours as needed. 08/23/20   [provider]  levofloxacin (LEVAQUIN) 500 MG tablet Take 500 mg by mouth daily. Patient not taking: Reported on 01/23/2021 12/23/20   [provider]  levothyroxine (SYNTHROID) 175 MCG tablet Take 175 mcg by mouth daily. 08/15/20   [provider]  lidocaine-prilocaine (EMLA) cream Apply 1 application topically as needed for up to 30 doses. 01/23/21    Volanda Napoleon, MD  Loratadine (CLARITIN PO) Take 10 mg by mouth at bedtime. Allertec    [provider]  Melatonin 5 MG CHEW Chew 1 tablet by mouth daily as needed.     [provider]  omeprazole (PRILOSEC) 20 MG capsule Take 1 capsule (20 mg total) by mouth 2 (two) times daily before a meal. 02/26/20   Cirigliano, Garvin Fila, DO  potassium chloride (MICRO-K) 10 MEQ CR capsule Take 10 mEq by mouth in the morning and at bedtime. 09/27/20 09/27/21  [provider]  propranolol ER (INDERAL LA) 80 MG 24 hr capsule Take 1 capsule (80 mg total) by mouth daily. 02/26/20   Cirigliano, Garvin Fila, DO  simvastatin (ZOCOR) 40 MG tablet TAKE ONE TABLET BY MOUTH DAILY AT BEDTIME 02/23/21   Cirigliano, Mary K, DO  venetoclax 100 MG TABS Take 200 mg by mouth daily. 08/15/20   [provider]    Allergies    Patient has no known allergies.  Review of Systems   Review of Systems  Constitutional: Negative for chills, diaphoresis and fever.  Respiratory: Positive for cough.   Cardiovascular: Negative for chest pain.  Gastrointestinal: Negative for abdominal pain, blood in stool, diarrhea, nausea and vomiting.  Genitourinary: Negative for difficulty urinating, dysuria, flank pain and hematuria.  Musculoskeletal: Positive for back pain.  Neurological: Negative for dizziness, syncope and weakness.    Physical Exam Updated Vital Signs BP (!) 118/104   Pulse 77   Temp 97.9 F (36.6 C) (Oral)   Resp 18   Ht 5\' 10"  (1.778 m)   Wt 94.3 kg   SpO2 98%   BMI 29.84 kg/m   Physical Exam Vitals and nursing note reviewed.  Constitutional:      General: He is not in acute distress.    Appearance: He is well-developed. He is not diaphoretic.  HENT:     Head: Normocephalic and atraumatic.     Mouth/Throat:     Mouth: Mucous membranes are moist.     Pharynx: Oropharynx is clear.     Comments: No evidence of bleeding within the mouth. Eyes:     Conjunctiva/sclera: Conjunctivae  normal.  Cardiovascular:     Rate and Rhythm: Normal rate and regular rhythm.     Pulses: Normal pulses.  Radial pulses are 2+ on the right side and 2+ on the left side.       Posterior tibial pulses are 2+ on the right side and 2+ on the left side.     Heart sounds: Normal heart sounds.     Comments: Tactile temperature in the extremities appropriate and equal bilaterally. Pulmonary:     Effort: Pulmonary effort is normal. No respiratory distress.     Breath sounds: Normal breath sounds.  Abdominal:     Palpations: Abdomen is soft.     Tenderness: There is no abdominal tenderness. There is no right CVA tenderness, left CVA tenderness or guarding.  Musculoskeletal:     Cervical back: Neck supple.       Back:     Right lower leg: 1+ Pitting Edema present.     Left lower leg: 1+ Pitting Edema present.  Lymphadenopathy:     Cervical: No cervical adenopathy.  Skin:    General: Skin is warm and dry.     Comments: Patient has small bruises on his upper extremities, however, no petechiae or widespread bruising noted.  Neurological:     Mental Status: He is alert.     Comments: Sensation grossly intact to light touch in the lower extremities bilaterally. No saddle anesthesias. Strength 5/5 in the bilateral lower extremities. No noted gait deficit. Coordination intact.  Psychiatric:        Mood and Affect: Mood and affect normal.        Speech: Speech normal.        Behavior: Behavior normal.     ED Results / Procedures / Treatments   Labs (all labs ordered are listed, but only abnormal results are displayed) Labs Reviewed  COMPREHENSIVE METABOLIC PANEL - Abnormal; Notable for the following components:      Result Value   Calcium 8.2 (*)    Total Protein 5.6 (*)    Albumin 3.2 (*)    AST 13 (*)    All other components within normal limits  CBC WITH DIFFERENTIAL/PLATELET - Abnormal; Notable for the following components:   RBC 3.51 (*)    Hemoglobin 11.7 (*)    HCT  35.7 (*)    MCV 101.7 (*)    RDW 15.9 (*)    Platelets 26 (*)    All other components within normal limits  URINALYSIS, ROUTINE W REFLEX MICROSCOPIC - Abnormal; Notable for the following components:   Hgb urine dipstick LARGE (*)    All other components within normal limits  URINALYSIS, MICROSCOPIC (REFLEX) - Abnormal; Notable for the following components:   Bacteria, UA RARE (*)    All other components within normal limits  D-DIMER, QUANTITATIVE - Abnormal; Notable for the following components:   D-Dimer, Quant 2.06 (*)    All other components within normal limits  RESP PANEL BY RT-PCR (FLU A&B, COVID) [id number]  URINE CULTURE  CBC  HEPARIN LEVEL (UNFRACTIONATED)  TROPONIN I (HIGH SENSITIVITY)  TROPONIN I (HIGH SENSITIVITY)    EKG EKG Interpretation  Date/Time:  Wednesday Mar 15 2021 12:03:35 EDT Ventricular Rate:  86 PR Interval:  186 QRS Duration: 86 QT Interval:  370 QTC Calculation: 442 R Axis:   25 Text Interpretation: Sinus rhythm with Fusion complexes Low voltage QRS Inferior infarct , age undetermined Abnormal ECG Confirmed by Nanda Quinton [phone]) on 03/15/2021 7:02:48 PM   Radiology DG Chest 2 View  Result Date: 03/15/2021 CLINICAL DATA:  Shortness of breath EXAM: CHEST - 2 VIEW COMPARISON:  08/04/2020  FINDINGS: Dual lumen right chest port terminates in the region of the superior vena cava. The heart size is within normal limits. No pulmonary vascular congestion. The lungs are hyperexpanded. Linear opacities at the lung bases likely due to atelectasis or scarring. IMPRESSION: No acute cardiopulmonary process. Electronically Signed   By: Miachel Roux M.D.   On: 03/15/2021 12:33   CT Angio Chest PE W and/or Wo Contrast  Result Date: 03/15/2021 CLINICAL DATA:  85 year old with back pain.  Cough. EXAM: CT ANGIOGRAPHY CHEST WITH CONTRAST TECHNIQUE: Multidetector CT imaging of the chest was performed using the standard protocol during bolus administration of intravenous  contrast. Multiplanar CT image reconstructions and MIPs were obtained to evaluate the vascular anatomy. CONTRAST:  165mL OMNIPAQUE IOHEXOL 350 MG/ML SOLN COMPARISON:  Radiograph earlier today FINDINGS: Cardiovascular: Examination is positive for acute pulmonary embolus. Pulmonary arterial filling defects are seen in the right distal main pulmonary artery, extending into the middle and lower lobar branches. Lower lobe thrombus is near occlusive. There is subsegmental thrombus in the right upper lobe pulmonary artery. No definite left-sided filling defects. There is right heart strain with RV to LV ratio of 1.5 as well as mild straightening of the intraventricular septum. Small pericardial effusion. Coronary artery calcifications are seen. Right chest port is in place, tip at the atrial caval junction. There are multiple right chest wall collaterals. No evidence of SVC occlusion. Moderate aortic atherosclerosis without aneurysm or dissection. Mediastinum/Nodes: No enlarged mediastinal or hilar lymph nodes. Decompressed esophagus. Thyroid gland not visualized. Lungs/Pleura: Peripheral opacities in the medial and lateral right lower lobe slightly triangular may represent pulmonary infarcts. There are patchy ground-glass nodular opacities in the posterior right upper lobe. Small right pleural effusion. Mild emphysema. No pulmonary mass. Upper Abdomen: Assessed on concurrent noncontrast abdominal CT, reported separately. There is minimal contrast refluxing into the hepatic veins and IVC. Musculoskeletal: Thoracic spondylosis with multilevel endplate spurring in the thoracic spine. No acute osseous abnormality or evidence of focal bone lesion. Review of the MIP images confirms the above findings. IMPRESSION: 1. Examination is positive for acute pulmonary embolus with moderate clot burden and evidence of right heart strain, RV to LV ratio of 1.5. 2. Peripheral opacities in the medial and lateral right lower lobe may  represent pulmonary infarcts. 3. Patchy ground-glass nodular opacities in the posterior right upper lobe may be infectious or inflammatory, including COVID-19 pneumonia. 4. Small right pleural effusion. 5. Mild emphysema. Aortic Atherosclerosis (ICD10-I70.0) and Emphysema (ICD10-J43.9). Critical Value/emergent results were called by telephone at the time of interpretation on 03/15/2021 at 4:11 pm to provider [facility] , who verbally acknowledged these results. Electronically Signed   By: Keith Rake M.D.   On: 03/15/2021 16:11   CT Renal Stone Study  Result Date: 03/15/2021 CLINICAL DATA:  Hematuria. EXAM: CT ABDOMEN AND PELVIS WITHOUT CONTRAST TECHNIQUE: Multidetector CT imaging of the abdomen and pelvis was performed following the standard protocol without IV contrast. COMPARISON:  Renal ultrasound 08/05/2020 FINDINGS: Lower chest: Assessed on concurrent chest CT, reported separately. Hepatobiliary: No focal hepatic abnormality on noncontrast exam. Decompressed gallbladder. No calcified gallstone or pericholecystic inflammation. Pancreas: No ductal dilatation or inflammation. Spleen: Mild splenomegaly, spleen measuring 12.4 x 13.1 x 6.5 cm (volume = 550 cm^3). No focal abnormality. Adrenals/Urinary Tract: Normal adrenal glands. No hydronephrosis or renal calculi. Calcifications at the renal hila are felt to be vascular. Minimal symmetric perinephric edema typically chronic. No evidence of focal renal lesion on this noncontrast exam. Partially distended urinary bladder.  No bladder wall thickening or stone. Stomach/Bowel: Decompressed stomach. No small bowel obstruction or inflammation. Scattered colonic diverticulosis without diverticulitis. Appendectomy. Vascular/Lymphatic: Moderate aortic atherosclerosis. Bi-iliac stents in place. No bulky abdominopelvic adenopathy. Reproductive: Prostate is unremarkable. Other: No ascites or free air. Fat containing inguinal hernias, left greater than right.  Musculoskeletal: No acute osseous abnormality or focal bone lesion. IMPRESSION: 1. No renal stones or obstructive uropathy. Calcifications at the renal hila are felt to be vascular. 2. Colonic diverticulosis without diverticulitis. 3. Mild splenomegaly. Aortic Atherosclerosis (ICD10-I70.0). Electronically Signed   By: Keith Rake M.D.   On: 03/15/2021 16:20    Procedures Procedures   Medications Ordered in ED Medications  heparin ADULT infusion 100 units/mL (25000 units/267mL) (1,500 Units/hr Intravenous New Bag/Given 03/15/21 1702)  iohexol (OMNIPAQUE) 350 MG/ML injection 100 mL (100 mLs Intravenous Contrast Given 03/15/21 1516)    ED Course  I have reviewed the triage vital signs and the nursing notes.  Pertinent labs & imaging results that were available during my care of the patient were reviewed by me and considered in my medical decision making (see chart for details).  Clinical Course as of 03/15/21 1911  Wed Mar 15, 2021  1327 Platelets(!!): 26 Noted to be 35 two days ago, 46 six days ago, 41 nine days ago. Patient is on both Vidaza and the venetoclax infusions, both of which can cause thrombocytopenia. [SJ]  [id number] Spoke with Dr. [name], patient's heme-onc physician.  We discussed the patient's presenting symptoms as well as the CT finding of PE with right heart strain.  We also discussed his platelet count today compared to the most recent level. We discussed options for management of this patient's PE and he states his preference is for heparin infusion without bolus.  He will follow the patient during his inpatient course.  Please add him as a consulting physician in the patient's chart. [SJ]  Dooly with Dr. Pietro Cassis, hospitalist at [facility]. Agrees to admit the patient. [SJ]    Clinical Course User Index [SJ] Kristjan Derner, Helane Gunther, PA-C   MDM Rules/Calculators/A&P                          Patient presents with right mid back pain.  Increase in pain with deep  breathing.  The location of the patient's pain and its description suggested the possibility for something renal, such as renal stone, as well as the possibility for pulmonary source. I reviewed the patient's chart to obtain more information.  I personally reviewed and interpreted the patient's labs and imaging studies. Thrombocytopenia continues to be present, slightly lower than most recent previous. D-dimer elevated.  CT with moderate PE in the right lung and evidence of right heart strain.  Most recent chemotherapy infusion May 6.  Findings and plan of care discussed with attending physician, Dewaine Conger, MD and then with Gara Kroner, MD after ED shift change. Dr. Laverta Baltimore personally evaluated and examined this patient.  Vitals:   03/15/21 1530 03/15/21 1623 03/15/21 1730 03/15/21 1830  BP: (!) 162/62 119/65 (!) 142/108 132/68  Pulse: 76 73 72 86  Resp: 18 17 19  (!) 23  Temp:      TempSrc:      SpO2: 98% 97% 99% 94%  Weight:      Height:         Final Clinical Impression(s) / ED Diagnoses Final diagnoses:  Single subsegmental pulmonary embolism without acute cor pulmonale (HCC)  Rx / DC Orders ED Discharge Orders    None       Layla Maw 03/15/21 1919    Breck Coons, MD 03/16/21 1253

## 2021-03-15 NOTE — ED Provider Notes (Signed)
Emergency Medicine Provider Triage Evaluation Note  Victor [name] , a 85 y.o. male  was evaluated in triage.  Pt complains of right midback pain for the last two days. One week of cough. Some shortness of breath.  Review of Systems  Positive: Back pain, cough, shortness of breath Negative: Numbness, weakness, trauma, fever, urinary symptoms  Physical Exam  BP 129/90   Pulse 90   Temp 98.6 F (37 C)   Resp 16   Ht 5\' 10"  (1.778 m)   Wt 94.3 kg   BMI 29.84 kg/m  Gen:   Awake, no distress   Resp:  Normal effort, lungs clear. No apparent distress MSK:   Moves extremities without difficulty  Other:  LE edema, not acute.  Medical Decision Making  Medically screening exam initiated at 11:24 AM.  Appropriate orders placed.  [name] was informed that the remainder of the evaluation will be completed by another provider, this initial triage assessment does not replace that evaluation, and the importance of remaining in the ED until their evaluation is complete.     Lorayne Bender, PA-C 03/15/21 1128    Breck Coons, MD 03/16/21 1253

## 2021-03-15 NOTE — Progress Notes (Signed)
Direct admission accepted from [facility] in a progressive bed.  85 year old male with AML on chemotherapy under the care of Dr. [name]. Presented to the ED with complaint of upper back pain.   Stable vitals, breathing comfortably room air D-dimer elevated CT pulm angiogram positive for acute pulm embolism with moderate clot burden, evidence of right heart strain. Platelet count chronically low, at 26,000 today. EDP discussed with Dr. [name] who reportedly recommended heparin drip without a bolus. Accepted at progressive bed. Dr. [name] to follow-up tomorrow.

## 2021-03-16 ENCOUNTER — Other Ambulatory Visit: Payer: Medicare Other

## 2021-03-16 ENCOUNTER — Inpatient Hospital Stay (HOSPITAL_COMMUNITY): Payer: Medicare Other

## 2021-03-16 DIAGNOSIS — D696 Thrombocytopenia, unspecified: Secondary | ICD-10-CM

## 2021-03-16 DIAGNOSIS — I82411 Acute embolism and thrombosis of right femoral vein: Secondary | ICD-10-CM

## 2021-03-16 DIAGNOSIS — I2602 Saddle embolus of pulmonary artery with acute cor pulmonale: Secondary | ICD-10-CM

## 2021-03-16 DIAGNOSIS — R609 Edema, unspecified: Secondary | ICD-10-CM | POA: Diagnosis not present

## 2021-03-16 DIAGNOSIS — I82401 Acute embolism and thrombosis of unspecified deep veins of right lower extremity: Secondary | ICD-10-CM | POA: Diagnosis present

## 2021-03-16 DIAGNOSIS — C9500 Acute leukemia of unspecified cell type not having achieved remission: Secondary | ICD-10-CM

## 2021-03-16 LAB — BASIC METABOLIC PANEL
Anion gap: 5 (ref 5–15)
BUN: 14 mg/dL (ref 8–23)
CO2: 29 mmol/L (ref 22–32)
Calcium: 8.6 mg/dL — ABNORMAL LOW (ref 8.9–10.3)
Chloride: 106 mmol/L (ref 98–111)
Creatinine, Ser: 0.89 mg/dL (ref 0.61–1.24)
GFR, Estimated: >60 mL/min (ref >60)
Glucose, Bld: 95 mg/dL (ref 70–99)
Potassium: 3.8 mmol/L (ref 3.5–5.1)
Sodium: 140 mmol/L (ref 135–145)

## 2021-03-16 LAB — HEPARIN LEVEL (UNFRACTIONATED)
Heparin Unfractionated: 0.42 IU/mL (ref 0.30–0.70)
Heparin Unfractionated: 0.53 IU/mL (ref 0.30–0.70)
Heparin Unfractionated: 0.84 IU/mL — ABNORMAL HIGH (ref 0.30–0.70)

## 2021-03-16 LAB — CBC
HCT: 32.9 % — ABNORMAL LOW (ref 39.0–52.0)
Hemoglobin: 10.6 g/dL — ABNORMAL LOW (ref 13.0–17.0)
MCH: 33.1 pg (ref 26.0–34.0)
MCHC: 32.2 g/dL (ref 30.0–36.0)
MCV: 102.8 fL — ABNORMAL HIGH (ref 80.0–100.0)
Platelets: 21 10*3/uL — CL (ref 150–400)
RBC: 3.2 MIL/uL — ABNORMAL LOW (ref 4.22–5.81)
RDW: 15.7 % — ABNORMAL HIGH (ref 11.5–15.5)
WBC: 3 10*3/uL — ABNORMAL LOW (ref 4.0–10.5)
nRBC: 0 % (ref 0.0–0.2)

## 2021-03-16 LAB — ECHOCARDIOGRAM COMPLETE
Area-P 1/2: 2.29 cm2
Height: 70 in
S' Lateral: 3.1 cm
Weight: 3327.99 oz

## 2021-03-16 LAB — URINE CULTURE

## 2021-03-16 MED ORDER — LEVOTHYROXINE SODIUM 75 MCG PO TABS
175.0000 ug | ORAL_TABLET | Freq: Every day | ORAL | Status: DC
  Administered 2021-03-16 – 2021-03-18 (×3): 175 ug via ORAL
  Filled 2021-03-16 (×3): qty 1

## 2021-03-16 MED ORDER — PERFLUTREN LIPID MICROSPHERE
1.0000 mL | INTRAVENOUS | Status: AC | PRN
  Administered 2021-03-16: 4 mL via INTRAVENOUS

## 2021-03-16 MED ORDER — ACETAMINOPHEN 500 MG PO TABS: 1000.0000 mg | ORAL_TABLET | Freq: Four times a day (QID) | ORAL | Status: DC | PRN

## 2021-03-16 MED ORDER — ACETAMINOPHEN 650 MG RE SUPP: 650.0000 mg | Freq: Four times a day (QID) | RECTAL | Status: DC | PRN

## 2021-03-16 MED ORDER — PROPRANOLOL HCL ER 80 MG PO CP24
80.0000 mg | ORAL_CAPSULE | Freq: Every day | ORAL | Status: DC
  Administered 2021-03-16 – 2021-03-18 (×3): 80 mg via ORAL
  Filled 2021-03-16 (×3): qty 1

## 2021-03-16 MED ORDER — CHLORHEXIDINE GLUCONATE CLOTH 2 % EX PADS
6.0000 | MEDICATED_PAD | Freq: Every day | CUTANEOUS | Status: DC
  Administered 2021-03-16 – 2021-03-18 (×3): 6 via TOPICAL

## 2021-03-16 MED ORDER — ACETAMINOPHEN 325 MG PO TABS
650.0000 mg | ORAL_TABLET | Freq: Four times a day (QID) | ORAL | Status: DC | PRN
  Administered 2021-03-16: 650 mg via ORAL
  Filled 2021-03-16: qty 2

## 2021-03-16 MED ORDER — CHLORHEXIDINE GLUCONATE CLOTH 2 % EX PADS: 6.0000 | MEDICATED_PAD | Freq: Every day | CUTANEOUS | Status: DC

## 2021-03-16 MED ORDER — TRAMADOL HCL 50 MG PO TABS
50.0000 mg | ORAL_TABLET | Freq: Once | ORAL | Status: AC
  Administered 2021-03-16: 50 mg via ORAL
  Filled 2021-03-16: qty 1

## 2021-03-16 MED ORDER — MELATONIN 5 MG PO TABS: 5.0000 mg | ORAL_TABLET | Freq: Every evening | ORAL | Status: DC | PRN

## 2021-03-16 MED ORDER — VENETOCLAX 100 MG PO TABS
100.0000 mg | ORAL_TABLET | Freq: Four times a day (QID) | ORAL | Status: DC
  Administered 2021-03-16: 100 mg via ORAL

## 2021-03-16 MED ORDER — LORATADINE 10 MG PO TABS
10.0000 mg | ORAL_TABLET | Freq: Every day | ORAL | Status: DC
  Administered 2021-03-16 – 2021-03-17 (×2): 10 mg via ORAL
  Filled 2021-03-16 (×2): qty 1

## 2021-03-16 MED ORDER — HEPARIN (PORCINE) 25000 UT/250ML-% IV SOLN
1350.0000 [IU]/h | INTRAVENOUS | Status: DC
  Administered 2021-03-16 – 2021-03-17 (×2): 1350 [IU]/h via INTRAVENOUS
  Filled 2021-03-16 (×2): qty 250

## 2021-03-16 MED ORDER — ONDANSETRON HCL 4 MG/2ML IJ SOLN: 4.0000 mg | Freq: Four times a day (QID) | INTRAMUSCULAR | Status: DC | PRN

## 2021-03-16 MED ORDER — SODIUM CHLORIDE 0.9% FLUSH
10.0000 mL | INTRAVENOUS | Status: DC | PRN
  Administered 2021-03-18: 10 mL

## 2021-03-16 MED ORDER — SIMVASTATIN 40 MG PO TABS
40.0000 mg | ORAL_TABLET | Freq: Every day | ORAL | Status: DC
  Administered 2021-03-16 – 2021-03-17 (×2): 40 mg via ORAL
  Filled 2021-03-16 (×2): qty 1

## 2021-03-16 MED ORDER — ONDANSETRON HCL 4 MG PO TABS: 4.0000 mg | ORAL_TABLET | Freq: Four times a day (QID) | ORAL | Status: DC | PRN

## 2021-03-16 NOTE — Progress Notes (Incomplete)
Introduction:  Pt is a 52 YOM with hx of acute myeloid leukemia, COPD, CKD stage III, GERD, HDL, and vocal cord/thyroid cancer presenting to the ED on 5/11 with c/o back pain for the last 2 days.  History of present illness: - aching pain reported, exacerbated w/ deep breath -  c/o cough for past week and states "feels like there is something to cough up but unable to do so".  - lower extremity bilateral edema reported, present for months  Past medical history/allergies/family history pertinent to current illness: - centrilobular emphysema - acute myeloid leukemia  PTA meds: No PTA AC   Social hx: - former smoker of 51 years with a pack year history of 102.  - denies using smokeless tobacco.  - 1 drink of rum per day - Denies drug use  Physical exam/relevant labs, diagnostic testing/imaging: Baseline labs: Hgb 11.3, Plt 35, D-dimer 2.06, Scr <1 US Doppler: right popliteal and femoral vein DVT Chest CT: PE  Problem based assessment and plan:  - Problem 1: PE/DVT  A. Assessment: Pharmacy consulted to dose heparin infusion for PE/DVT for this 22 YOM w/ CKD. Hgb and plt low-stable. No PTA AC.  B. Plan: Heparin 6,000 units IV bolus Heparin 1550 units/hr continuous IV infusion Obtain 8hr Heparin level, daily heparin level Monitor CBC and s/sx of bleeding

## 2021-03-16 NOTE — Progress Notes (Signed)
Dr Sabino Gasser aware pt wanted PAC accessed. See new order per MD.

## 2021-03-16 NOTE — Progress Notes (Addendum)
ANTICOAGULATION CONSULT NOTE - Follow Up Consult  Pharmacy Consult for Heparin Indication: pulmonary embolus  No Known Allergies  Patient Measurements: Height: 5\' 10"  (177.8 cm) Weight: 94.3 kg (208 lb) IBW/kg (Calculated) : 73 Heparin Dosing Weight: 94kg   Vital Signs: Temp: 98.2 F (36.8 C) (05/12 1958) Temp Source: Oral (05/12 1958) BP: 124/61 (05/12 1958) Pulse Rate: 80 (05/12 1958)  Labs: Recent Labs    03/15/21 1149 03/15/21 1626 03/15/21 1831 03/16/21 0033 03/16/21 0812 03/16/21 1825  HGB 11.7*  --   --  10.6*  --   --   HCT 35.7*  --   --  32.9*  --   --   PLT 26*  --   --  21*  --   --   HEPARINUNFRC  --   --   --  0.42 0.84* 0.53  CREATININE 1.01  --   --   --  0.89  --   TROPONINIHS  --  5 5  --   --   --     Estimated Creatinine Clearance: 72.5 mL/min (by C-G formula based on SCr of 0.89 mg/dL).  Assessment: Pt is an 74 YOM w/ hx of acute myeloid leukemia, COPD, CKD stage III, GERD, HDL, and vocal cord/thyroid cancer. He presented to the ED on 5/11 with c/o sharp, aching back pain for past 2 days and coughing. Found to have acute DVT and PE. Pharmacy consulted to dose IV heparin.  Today 03/16/21 - Heparin level is now therapeutic on 1350 units/hr, but slightly above lower goal range with thrombocytopenia - Hgb low-stable, Plt are quite low, but please see 5/12 Heme/Onc note with rationale for continuing full-dose anticoagulation - no s/s internal bleeding. Per RN, does have fair amount of bruising d/t thrombocytopenia; but does not think it is worse than previously  Goal of Therapy:  Heparin level: 0.3-0.5 Monitor platelets by anticoagulation protocol: Yes   Plan:  - Continue heparin at 1350 units/hr; targeting slightly below this d/t low plt, but since level is trending down, will not adjust before rechecking a confirmatory level - Recheck heparin level in AM - Daily heparin level and CBC - Monitor s/sx bleeding   Fredia Chittenden A 03/16/2021,8:11 PM

## 2021-03-16 NOTE — H&P (Signed)
History and Physical    Victor [name] KKX:[id number] DOB: April 30, 1936 DOA: 03/15/2021  PCP: Ronnald Nian, DO  Patient coming from: Home  I have personally briefly reviewed patient's old medical records in [place]  Chief Complaint: Back pain  HPI: Victor [name] is a 85 y.o. male with medical history significant of AML on treatment with Dr. 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  Pt presents to the ED with c/o 2 day h/o back pain.  Pain in R mid back onset 2 days ago.  Aching, sometimes sharp.  Worse with deep breath.  Pain is waxing and waning.  Also had cough for past week, non-productive.  Mild BLE edema.  No fever, chills, CP, hemoptysis, abd pain.   ED Course: Found to have PE with RHS.  Also RLE DVT.   Review of Systems: As per HPI, otherwise all review of systems negative.  Past Medical History:  Diagnosis Date  . Arthritis   . Atherosclerosis   . Atherosclerotic PVD with intermittent claudication (HCC)    stent left leg  . BPH (benign prostatic hyperplasia)   . Bulging lumbar disc   . Cancer ([place]) 11/12/14   vocal cord  carcinoma in situ , radiation; thyroid cancer  . Carotid bruit   . Chronic kidney disease    chronic stage III  . COPD (chronic obstructive pulmonary disease) ([place])   . DDD (degenerative disc disease), lumbar   . Dysphonia   . Dysplasia of true vocal cord   . GERD (gastroesophageal reflux disease)   . H/O carotid atherosclerosis    b/l  . Hyperlipidemia   . Hypothyroidism   . Occasional tremors    left hand managed with propranolol  . Peripheral vascular angioplasty status with implants and grafts   . Precancerous lesion 03/05/2018   premelanoma removed from back.   . Radiation 03/10/15- 04/18/16   Larynx  . Sleep apnea    hasn't used CPAP in years  . Spondylosis of lumbosacral region   . Thyroid disease     Past Surgical History:  Procedure Laterality Date  .  APPENDECTOMY    . COLONOSCOPY W/ POLYPECTOMY    . DIODE LASER APPLICATION Left 16/96/7893   Procedure: DIODE LASER APPLICATION;  Surgeon: Hayden Pedro, MD;  Location: [facility];  Service: Ophthalmology;  Laterality: Left;  . IR IMAGING GUIDED PORT INSERTION  01/16/2021  . MEMBRANE PEEL Left 08/18/2019   Procedure: MEMBRANE PEEL;  Surgeon: Hayden Pedro, MD;  Location: [facility];  Service: Ophthalmology;  Laterality: Left;  . MOUTH SURGERY     tooth extraction  . PARS PLANA VITRECTOMY Left 08/18/2019   PARS PLANA VITRECTOMY 27 GAUGE (Left)  . PARS PLANA VITRECTOMY 27 GAUGE Left 08/18/2019   Procedure: PARS PLANA VITRECTOMY 27 GAUGE;  Surgeon: Hayden Pedro, MD;  Location: [facility];  Service: Ophthalmology;  Laterality: Left;  . PHOTOCOAGULATION WITH LASER Left 08/18/2019   Procedure: PHOTOCOAGULATION WITH LASER;  Surgeon: Hayden Pedro, MD;  Location: [facility];  Service: Ophthalmology;  Laterality: Left;  . THYROID SURGERY    . TONSILLECTOMY    . vocal cord biopsy  11/12/14   squamous cell carcinoma in situ     reports that he quit smoking about 17 years ago. His smoking use included cigarettes. He started smoking about 68 years ago. He has a 102.00 pack-year smoking history. He has never used smokeless tobacco. He reports current alcohol  use of about 7.0 standard drinks of alcohol per week. He reports that he does not use drugs.  No Known Allergies  Family History  Problem Relation Age of Onset  . Colon cancer Father   . Cancer Sister        Died of metastatic cancer  . Leukemia Neg Hx      Prior to Admission medications   Medication Sig Start Date End Date Taking? Authorizing Provider  acetaminophen (TYLENOL) 500 MG tablet Take 1,000 mg by mouth every 6 (six) hours as needed for moderate pain. 08/23/20  Yes [provider]  doxazosin (CARDURA) 2 MG tablet Take 1 tablet (2 mg total) by mouth at bedtime. 02/26/20  Yes Cirigliano, Mary K, DO  fluticasone (FLONASE) 50 MCG/ACT  nasal spray Place 2 sprays into the nose 2 (two) times daily as needed for allergies.   Yes [provider]  ibuprofen (ADVIL) 200 MG tablet Take 400 mg by mouth every 6 (six) hours as needed for mild pain. 08/23/20  Yes [provider]  levothyroxine (SYNTHROID) 175 MCG tablet Take 175 mcg by mouth daily. 08/15/20  Yes [provider]  lidocaine-prilocaine (EMLA) cream Apply 1 application topically as needed for up to 30 doses. 01/23/21  Yes Volanda Napoleon, MD  Loratadine (CLARITIN PO) Take 10 mg by mouth at bedtime. Allertec   Yes [provider]  Melatonin 5 MG CHEW Chew 1 tablet by mouth daily as needed (sleep).   Yes [provider]  potassium chloride (MICRO-K) 10 MEQ CR capsule Take 10 mEq by mouth in the morning and at bedtime. 09/27/20 09/27/21 Yes [provider]  propranolol ER (INDERAL LA) 80 MG 24 hr capsule Take 1 capsule (80 mg total) by mouth daily. 02/26/20  Yes Cirigliano, Mary K, DO  simvastatin (ZOCOR) 40 MG tablet TAKE ONE TABLET BY MOUTH DAILY AT BEDTIME Patient taking differently: Take 40 mg by mouth daily at 6 PM. 02/23/21  Yes Cirigliano, Mary K, DO  venetoclax 100 MG TABS Take 100 mg by mouth in the morning, at noon, in the evening, and at bedtime. 08/15/20  Yes [provider]  acyclovir (ZOVIRAX) 400 MG tablet Take 1 tablet (400 mg total) by mouth 2 (two) times daily as needed. 11/24/20   Owens Shark, NP  hydrALAZINE (APRESOLINE) 25 MG tablet Take 1 tablet (25 mg total) by mouth 3 (three) times daily as needed. Take if standing BP >140/80 mm Hg 12/05/20 03/05/21  [name], MD  HYDROcodone-acetaminophen (NORCO) 5-325 MG tablet Take 1 tablet by mouth every 6 (six) hours as needed for moderate pain. Patient not taking: Reported on 03/15/2021 09/09/20   Owens Shark, NP    Physical Exam: Vitals:   03/15/21 2030 03/15/21 2100 03/15/21 2222 03/16/21 0156  BP: (!) 134/109 (!) 130/54 140/73 (!) 111/51  Pulse: 87  81 79 91  Resp: (!) 21 19 18 20   Temp:   98.7 F (37.1 C) 98.9 F (37.2 C)  TempSrc:   Oral Oral  SpO2: 96% 94% 97% 97%  Weight:      Height:        Constitutional: NAD, calm, comfortable Eyes: PERRL, lids and conjunctivae normal ENMT: Mucous membranes are moist. Posterior pharynx clear of any exudate or lesions.Normal dentition.  Neck: normal, supple, no masses, no thyromegaly Respiratory: clear to auscultation bilaterally, no wheezing, no crackles. Normal respiratory effort. No accessory muscle use.  Cardiovascular: Regular rate and rhythm, no murmurs / rubs / gallops.  No extremity edema. 2+ pedal pulses. No carotid bruits.  Abdomen: no tenderness, no masses palpated. No hepatosplenomegaly. Bowel sounds positive.  Musculoskeletal: no clubbing / cyanosis. No joint deformity upper and lower extremities. Good ROM, no contractures. Normal muscle tone.  Skin: no rashes, lesions, ulcers. No induration Neurologic: CN 2-12 grossly intact. Sensation intact, DTR normal. Strength 5/5 in all 4.  Psychiatric: Normal judgment and insight. Alert and oriented x 3. Normal mood.    Labs on Admission: I have personally reviewed following labs and imaging studies  CBC: Recent Labs  Lab 03/09/21 0858 03/13/21 1132 03/15/21 1149 03/16/21 0033  WBC 5.9 4.6 4.1 3.0*  NEUTROABS 5.0 3.5 3.1  --   HGB 10.9* 11.3* 11.7* 10.6*  HCT 32.8* 33.9* 35.7* 32.9*  MCV 99.7 98.8 101.7* 102.8*  PLT 44* 35* 26* 21*   Basic Metabolic Panel: Recent Labs  Lab 03/09/21 0858 03/13/21 1132 03/15/21 1149  NA 141 137 138  K 3.4* 3.6 3.8  CL 106 104 103  CO2 26 28 28   GLUCOSE 160* 113* 97  BUN 23 23 17   CREATININE 0.90 0.98 1.01  CALCIUM 8.6* 8.4* 8.2*   GFR: Estimated Creatinine Clearance: 63.9 mL/min (by C-G formula based on SCr of 1.01 mg/dL). Liver Function Tests: Recent Labs  Lab 03/09/21 0858 03/13/21 1132 03/15/21 1149  AST 11* 9* 13*  ALT 10 11 15   ALKPHOS 49 46 47  BILITOT 0.5 0.7 0.8   PROT 5.1* 4.6* 5.6*  ALBUMIN 3.8 3.4* 3.2*   No results for input(s): LIPASE, AMYLASE in the last 168 hours. No results for input(s): AMMONIA in the last 168 hours. Coagulation Profile: No results for input(s): INR, PROTIME in the last 168 hours. Cardiac Enzymes: No results for input(s): CKTOTAL, CKMB, CKMBINDEX, TROPONINI in the last 168 hours. BNP (last 3 results) No results for input(s): PROBNP in the last 8760 hours. HbA1C: No results for input(s): HGBA1C in the last 72 hours. CBG: No results for input(s): GLUCAP in the last 168 hours. Lipid Profile: No results for input(s): CHOL, HDL, LDLCALC, TRIG, CHOLHDL, LDLDIRECT in the last 72 hours. Thyroid Function Tests: No results for input(s): TSH, T4TOTAL, FREET4, T3FREE, THYROIDAB in the last 72 hours. Anemia Panel: No results for input(s): VITAMINB12, FOLATE, FERRITIN, TIBC, IRON, RETICCTPCT in the last 72 hours. Urine analysis:    Component Value Date/Time   COLORURINE YELLOW 03/15/2021 [place] 03/15/2021 1149   LABSPEC 1.010 03/15/2021 1149   PHURINE 6.0 03/15/2021 1149   GLUCOSEU NEGATIVE 03/15/2021 1149   HGBUR LARGE (A) 03/15/2021 1149   BILIRUBINUR NEGATIVE 03/15/2021 [place] 03/15/2021 [place] 03/15/2021 1149   NITRITE NEGATIVE 03/15/2021 [place] 03/15/2021 1149    Radiological Exams on Admission: DG Chest 2 View  Result Date: 03/15/2021 CLINICAL DATA:  Shortness of breath EXAM: CHEST - 2 VIEW COMPARISON:  08/04/2020 FINDINGS: Dual lumen right chest port terminates in the region of the superior vena cava. The heart size is within normal limits. No pulmonary vascular congestion. The lungs are hyperexpanded. Linear opacities at the lung bases likely due to atelectasis or scarring. IMPRESSION: No acute cardiopulmonary process. Electronically Signed   By: Miachel Roux M.D.   On: 03/15/2021 12:33   CT Angio Chest PE W and/or Wo  Contrast  Result Date: 03/15/2021 CLINICAL DATA:  85 year old with back pain.  Cough. EXAM: CT ANGIOGRAPHY CHEST WITH CONTRAST TECHNIQUE: Multidetector CT imaging of the chest was  performed using the standard protocol during bolus administration of intravenous contrast. Multiplanar CT image reconstructions and MIPs were obtained to evaluate the vascular anatomy. CONTRAST:  14mL OMNIPAQUE IOHEXOL 350 MG/ML SOLN COMPARISON:  Radiograph earlier today FINDINGS: Cardiovascular: Examination is positive for acute pulmonary embolus. Pulmonary arterial filling defects are seen in the right distal main pulmonary artery, extending into the middle and lower lobar branches. Lower lobe thrombus is near occlusive. There is subsegmental thrombus in the right upper lobe pulmonary artery. No definite left-sided filling defects. There is right heart strain with RV to LV ratio of 1.5 as well as mild straightening of the intraventricular septum. Small pericardial effusion. Coronary artery calcifications are seen. Right chest port is in place, tip at the atrial caval junction. There are multiple right chest wall collaterals. No evidence of SVC occlusion. Moderate aortic atherosclerosis without aneurysm or dissection. Mediastinum/Nodes: No enlarged mediastinal or hilar lymph nodes. Decompressed esophagus. Thyroid gland not visualized. Lungs/Pleura: Peripheral opacities in the medial and lateral right lower lobe slightly triangular may represent pulmonary infarcts. There are patchy ground-glass nodular opacities in the posterior right upper lobe. Small right pleural effusion. Mild emphysema. No pulmonary mass. Upper Abdomen: Assessed on concurrent noncontrast abdominal CT, reported separately. There is minimal contrast refluxing into the hepatic veins and IVC. Musculoskeletal: Thoracic spondylosis with multilevel endplate spurring in the thoracic spine. No acute osseous abnormality or evidence of focal bone lesion. Review of the MIP  images confirms the above findings. IMPRESSION: 1. Examination is positive for acute pulmonary embolus with moderate clot burden and evidence of right heart strain, RV to LV ratio of 1.5. 2. Peripheral opacities in the medial and lateral right lower lobe may represent pulmonary infarcts. 3. Patchy ground-glass nodular opacities in the posterior right upper lobe may be infectious or inflammatory, including COVID-19 pneumonia. 4. Small right pleural effusion. 5. Mild emphysema. Aortic Atherosclerosis (ICD10-I70.0) and Emphysema (ICD10-J43.9). Critical Value/emergent results were called by telephone at the time of interpretation on 03/15/2021 at 4:11 pm to provider [facility] , who verbally acknowledged these results. Electronically Signed   By: Keith Rake M.D.   On: 03/15/2021 16:11   US Venous Img Lower Bilateral (DVT)  Result Date: 03/15/2021 CLINICAL DATA:  Known pulmonary emboli EXAM: BILATERAL LOWER EXTREMITY VENOUS DOPPLER ULTRASOUND TECHNIQUE: Gray-scale sonography with graded compression, as well as color Doppler and duplex ultrasound were performed to evaluate the lower extremity deep venous systems from the level of the common femoral vein and including the common femoral, femoral, profunda femoral, popliteal and calf veins including the posterior tibial, peroneal and gastrocnemius veins when visible. The superficial great saphenous vein was also interrogated. Spectral Doppler was utilized to evaluate flow at rest and with distal augmentation maneuvers in the common femoral, femoral and popliteal veins. COMPARISON:  None. FINDINGS: RIGHT LOWER EXTREMITY Common Femoral Vein: No evidence of thrombus. Normal compressibility, respiratory phasicity and response to augmentation. Saphenofemoral Junction: No evidence of thrombus. Normal compressibility and flow on color Doppler imaging. Profunda Femoral Vein: No evidence of thrombus. Normal compressibility and flow on color Doppler imaging. Femoral Vein:  Thrombus is noted with decreased compressibility. Popliteal Vein: Thrombus is noted with decreased compressibility. Calf Veins: No evidence of thrombus. Normal compressibility and flow on color Doppler imaging. Superficial Great Saphenous Vein: No evidence of thrombus. Normal compressibility. Venous Reflux:  None. Other Findings:  None. LEFT LOWER EXTREMITY Common Femoral Vein: No evidence of thrombus. Normal compressibility, respiratory phasicity and response to augmentation. Saphenofemoral Junction: No evidence of thrombus.  Normal compressibility and flow on color Doppler imaging. Profunda Femoral Vein: No evidence of thrombus. Normal compressibility and flow on color Doppler imaging. Femoral Vein: No evidence of thrombus. Normal compressibility, respiratory phasicity and response to augmentation. Popliteal Vein: No evidence of thrombus. Normal compressibility, respiratory phasicity and response to augmentation. Calf Veins: No evidence of thrombus. Normal compressibility and flow on color Doppler imaging. Superficial Great Saphenous Vein: No evidence of thrombus. Normal compressibility. Venous Reflux:  None. Other Findings:  None. IMPRESSION: Right popliteal and femoral vein deep venous thrombosis. Electronically Signed   By: Inez Catalina M.D.   On: 03/15/2021 19:58   CT Renal Stone Study  Result Date: 03/15/2021 CLINICAL DATA:  Hematuria. EXAM: CT ABDOMEN AND PELVIS WITHOUT CONTRAST TECHNIQUE: Multidetector CT imaging of the abdomen and pelvis was performed following the standard protocol without IV contrast. COMPARISON:  Renal ultrasound 08/05/2020 FINDINGS: Lower chest: Assessed on concurrent chest CT, reported separately. Hepatobiliary: No focal hepatic abnormality on noncontrast exam. Decompressed gallbladder. No calcified gallstone or pericholecystic inflammation. Pancreas: No ductal dilatation or inflammation. Spleen: Mild splenomegaly, spleen measuring 12.4 x 13.1 x 6.5 cm (volume = 550 cm^3). No focal  abnormality. Adrenals/Urinary Tract: Normal adrenal glands. No hydronephrosis or renal calculi. Calcifications at the renal hila are felt to be vascular. Minimal symmetric perinephric edema typically chronic. No evidence of focal renal lesion on this noncontrast exam. Partially distended urinary bladder. No bladder wall thickening or stone. Stomach/Bowel: Decompressed stomach. No small bowel obstruction or inflammation. Scattered colonic diverticulosis without diverticulitis. Appendectomy. Vascular/Lymphatic: Moderate aortic atherosclerosis. Bi-iliac stents in place. No bulky abdominopelvic adenopathy. Reproductive: Prostate is unremarkable. Other: No ascites or free air. Fat containing inguinal hernias, left greater than right. Musculoskeletal: No acute osseous abnormality or focal bone lesion. IMPRESSION: 1. No renal stones or obstructive uropathy. Calcifications at the renal hila are felt to be vascular. 2. Colonic diverticulosis without diverticulitis. 3. Mild splenomegaly. Aortic Atherosclerosis (ICD10-I70.0). Electronically Signed   By: Keith Rake M.D.   On: 03/15/2021 16:20    EKG: Independently reviewed.  Assessment/Plan Principal Problem:   Pulmonary embolism (HCC) Active Problems:   Thrombocytopenia ([place])   Acute leukemia ([place])   Right leg DVT ([place])    1. PE - from RLE DVT 1. Despite chronic thrombocytopenia in setting of AML 2. EDP spoke with his oncologist Dr. [name]: 1. Heparin gtt no bolus 3. Tele monitor 4. 2d echo 5. Repeat CBC in AM 2. AML - 1. Cont Venetoclax 3. Thrombocytopenia - 1. Chronic, secondary to AML 2. Daily CBC on heparin gtt 4. HTN - 1. Med rec pending 2. Hold off on ordering for the moment and see what his BP does with the PE overnight first.  DVT prophylaxis: Heparin GTT Code Status: Full Family Communication: No family in room Disposition Plan: Home after treatment for PE Consults called: EDP called Dr. [name] Admission status: Admit to  inpatient  Severity of Illness: The appropriate patient status for this patient is INPATIENT. Inpatient status is judged to be reasonable and necessary in order to provide the required intensity of service to ensure the patient's safety. The patient's presenting symptoms, physical exam findings, and initial radiographic and laboratory data in the context of their chronic comorbidities is felt to place them at high risk for further clinical deterioration. Furthermore, it is not anticipated that the patient will be medically stable for discharge from the hospital within 2 midnights of admission. The following factors support the patient status of inpatient.   IP status for  treatment of PE with RHS.  * I certify that at the point of admission it is my clinical judgment that the patient will require inpatient hospital care spanning beyond 2 midnights from the point of admission due to high intensity of service, high risk for further deterioration and high frequency of surveillance required.*    Tyrus Wilms M. DO [facility]  How to contact the [facility] Attending or Consulting provider [place] or covering provider during after hours [place], for this patient?  1. Check the care team in [facility] and look for a) attending/consulting [facility] provider listed and b) the [facility] team listed 2. Log into [email or web address]  Amion Physician Scheduling and messaging for groups and whole hospitals  On call and physician scheduling software for group practices, residents, hospitalists and other medical providers for call, clinic, rotation and shift schedules. On[employer] is a hospital-wide system for scheduling doctors and paging doctors on call. EasyPlot is for scientific plotting and data analysis.  [email or web address]  and use [place]'s universal password to access. If you do not have the password, please contact the hospital operator.  3. Locate the [facility] provider you are looking for under [facility] and page to  a number that you can be directly reached. 4. If you still have difficulty reaching the provider, please page the [facility] (Director on Call) for the Hospitalists listed on amion for assistance.  03/16/2021, 4:19 AM

## 2021-03-16 NOTE — Progress Notes (Signed)
Progress Note    GORDAN GRELL   YJE:[id number]  DOB: [birth year]  DOA: 03/15/2021     1  PCP: Ronnald Nian, DO  CC: back pain  Hospital Course: Mr. Dayhoff is AN 85 year old male with PMH AML (follows with Dr. [name]), COPD, vocal cord and thyroid cancer in remission, thrombocytopenia.  He presented to the hospital with complaints of right lower back pain that has been ongoing for approximately 2 days prior to admission.  He also had endorsed slight worsening shortness of breath with minimal exertion. After further discussion with patient and his wife, he did endorse that he has not been as active as he typically is for the past several weeks to months but he denied being very sedentary either or just laying around.  He underwent work up on admission with CT renal stone study, CTA chest, and CXR.  There was mild bilateral perinephric edema noted on initial CT.  He also underwent CTA chest and was found to have a right-sided PE.  This was followed by lower extremity duplex which also showed right popliteal and femoral DVTs.  He was started on a heparin drip.  Oncology was also consulted and followed patient while hospitalized.  Interval History:  Seen this morning in his room, wife bedside.  When asked about his mobility and activity level at home, he did state that he has not quite been as active as he usually is but also does not necessarily imply that he was completely laying around and very sedentary either. No reports of bleeding since initiation of heparin drip.  ROS: Constitutional: negative for chills and fevers, Respiratory: negative for cough, Cardiovascular: negative for chest pain and Gastrointestinal: negative for abdominal pain  Assessment & Plan:  Acute PE Acute RLE DVT - right sided PE and right popliteal/femoral DVT - Etiology likely in setting of underlying AML and possibly from some decrease in mobility recently -Oncology also following - Continue heparin  drip and monitor for any signs of bleeding - Trend CBC - Tentative plan to transition to oral anticoagulation on Saturday - Follow-up echo  Right lower back pain - unclear etiology; possibly strain vs perinephric edema seen on CT vs infection - UA essentially neg; UCx unremarkable; his CVA tenderness was B/L on exam but more pronounced on the right - hold off on abx and treat supportively for now - repeat UA if develops fever   AML - continue home Venclexta -Continue following with oncology  Hypothyroidism - Continue Synthroid  Hypertension - Blood pressure remaining stable, will resume propranolol - Continue holding doxazosin  Old records reviewed in assessment of this patient  Antimicrobials:   DVT prophylaxis: Heparin drip    Code Status:   Code Status: Full Code Family Communication: wife  Disposition Plan: Status is: Inpatient  Remains inpatient appropriate because:IV treatments appropriate due to intensity of illness or inability to take PO and Inpatient level of care appropriate due to severity of illness   Dispo: The patient is from: Home              Anticipated d/c is to: Home              Patient currently is not medically stable to d/c.   Difficult to place patient No  Risk of unplanned readmission score: Unplanned Admission- Pilot do not use: 12.72   Objective: Blood pressure (!) 129/59, pulse 83, temperature 98.3 F (36.8 C), temperature source Oral, resp. rate 18, height 5\' 10"  (1.778 m),  weight 94.3 kg, SpO2 96 %.  Examination: General appearance: alert, cooperative and no distress Head: Normocephalic, without obvious abnormality, atraumatic Eyes: EOMI Lungs: clear to auscultation bilaterally Heart: regular rate and rhythm and S1, S2 normal Abdomen: normal findings: bowel sounds normal and soft, non-tender  Back: R>L CVA TTP Extremities: trace LE edema Skin: mobility and turgor normal Neurologic: Grossly normal  Consultants:    Oncology  Procedures:   n/a  Data Reviewed: I have personally reviewed following labs and imaging studies Results for orders placed or performed during the hospital encounter of 03/15/21 (from the past 24 hour(s))  D-dimer, quantitative     Status: Abnormal   Collection Time: 03/15/21  2:22 PM  Result Value Ref Range   D-Dimer, Quant 2.06 (H) 0.00 - 0.50 ug/mL-FEU  Troponin I (High Sensitivity)     Status: None   Collection Time: 03/15/21  4:26 PM  Result Value Ref Range   Troponin I (High Sensitivity) 5 <18 ng/L  Troponin I (High Sensitivity)     Status: None   Collection Time: 03/15/21  6:31 PM  Result Value Ref Range   Troponin I (High Sensitivity) 5 <18 ng/L  CBC     Status: Abnormal   Collection Time: 03/16/21 12:33 AM  Result Value Ref Range   WBC 3.0 (L) 4.0 - 10.5 K/uL   RBC 3.20 (L) 4.22 - 5.81 MIL/uL   Hemoglobin 10.6 (L) 13.0 - 17.0 g/dL   HCT 32.9 (L) 39.0 - 52.0 %   MCV 102.8 (H) 80.0 - 100.0 fL   MCH 33.1 26.0 - 34.0 pg   MCHC 32.2 30.0 - 36.0 g/dL   RDW 15.7 (H) 11.5 - 15.5 %   Platelets 21 (LL) 150 - 400 K/uL   nRBC 0.0 0.0 - 0.2 %  Heparin level (unfractionated)     Status: None   Collection Time: 03/16/21 12:33 AM  Result Value Ref Range   Heparin Unfractionated 0.42 0.30 - 0.70 IU/mL  Basic metabolic panel     Status: Abnormal   Collection Time: 03/16/21  8:12 AM  Result Value Ref Range   Sodium 140 135 - 145 mmol/L   Potassium 3.8 3.5 - 5.1 mmol/L   Chloride 106 98 - 111 mmol/L   CO2 29 22 - 32 mmol/L   Glucose, Bld 95 70 - 99 mg/dL   BUN 14 8 - 23 mg/dL   Creatinine, Ser 0.89 0.61 - 1.24 mg/dL   Calcium 8.6 (L) 8.9 - 10.3 mg/dL   GFR, Estimated >60 >60 mL/min   Anion gap 5 5 - 15  Heparin level (unfractionated)     Status: Abnormal   Collection Time: 03/16/21  8:12 AM  Result Value Ref Range   Heparin Unfractionated 0.84 (H) 0.30 - 0.70 IU/mL    Recent Results (from the past 240 hour(s))  Urine culture     Status: Abnormal   Collection  Time: 03/15/21 11:49 AM   Specimen: Urine, Random  Result Value Ref Range Status   Specimen Description   Final    URINE, RANDOM Performed at [facility], [address] [place], [place] [address]    Special Requests   Final    NONE Performed at [facility], [address] [place], [place] [address]    Culture MULTIPLE SPECIES PRESENT, SUGGEST RECOLLECTION (A)  Final   Report Status 03/16/2021 FINAL  Final  Resp Panel by RT-PCR (Flu A&B, Covid) Nasopharyngeal Swab  Status: None   Collection Time: 03/15/21  1:33 PM   Specimen: Nasopharyngeal Swab; Nasopharyngeal(NP) swabs in vial transport medium  Result Value Ref Range Status   SARS Coronavirus 2 by RT PCR NEGATIVE NEGATIVE Final    Comment: (NOTE) SARS-CoV-2 target nucleic acids are NOT DETECTED.  The SARS-CoV-2 RNA is generally detectable in upper respiratory specimens during the acute phase of infection. The lowest concentration of SARS-CoV-2 viral copies this assay can detect is 138 copies/mL. A negative result does not preclude SARS-Cov-2 infection and should not be used as the sole basis for treatment or other patient management decisions. A negative result may occur with  improper specimen collection/handling, submission of specimen other than nasopharyngeal swab, presence of viral mutation(s) within the areas targeted by this assay, and inadequate number of viral copies(<138 copies/mL). A negative result must be combined with clinical observations, patient history, and epidemiological information. The expected result is Negative.  Fact Sheet for Patients:  EntrepreneurPulse.com.au  Fact Sheet for Healthcare Providers:  IncredibleEmployment.be  This test is no t yet approved or cleared by the [place] FDA and  has been authorized for detection and/or diagnosis of SARS-CoV-2 by FDA under an Emergency Use Authorization (EUA). This EUA will remain  in  effect (meaning this test can be used) for the duration of the COVID-19 declaration under Section 564(b)(1) of the Act, 21 U.S.C.section 360bbb-3(b)(1), unless the authorization is terminated  or revoked sooner.       Influenza A by PCR NEGATIVE NEGATIVE Final   Influenza B by PCR NEGATIVE NEGATIVE Final    Comment: (NOTE) The Xpert Xpress SARS-CoV-2/FLU/RSV plus assay is intended as an aid in the diagnosis of influenza from Nasopharyngeal swab specimens and should not be used as a sole basis for treatment. Nasal washings and aspirates are unacceptable for Xpert Xpress SARS-CoV-2/FLU/RSV testing.  Fact Sheet for Patients: EntrepreneurPulse.com.au  Fact Sheet for Healthcare Providers: IncredibleEmployment.be  This test is not yet approved or cleared by the [place] FDA and has been authorized for detection and/or diagnosis of SARS-CoV-2 by FDA under an Emergency Use Authorization (EUA). This EUA will remain in effect (meaning this test can be used) for the duration of the COVID-19 declaration under Section 564(b)(1) of the Act, 21 U.S.C. section 360bbb-3(b)(1), unless the authorization is terminated or revoked.  Performed at [facility], [address], [place], [place] [address]      Radiology Studies: DG Chest 2 View  Result Date: 03/15/2021 CLINICAL DATA:  Shortness of breath EXAM: CHEST - 2 VIEW COMPARISON:  08/04/2020 FINDINGS: Dual lumen right chest port terminates in the region of the superior vena cava. The heart size is within normal limits. No pulmonary vascular congestion. The lungs are hyperexpanded. Linear opacities at the lung bases likely due to atelectasis or scarring. IMPRESSION: No acute cardiopulmonary process. Electronically Signed   By: Miachel Roux M.D.   On: 03/15/2021 12:33   CT Angio Chest PE W and/or Wo Contrast  Result Date: 03/15/2021 CLINICAL DATA:  85 year old with back pain.  Cough. EXAM: CT  ANGIOGRAPHY CHEST WITH CONTRAST TECHNIQUE: Multidetector CT imaging of the chest was performed using the standard protocol during bolus administration of intravenous contrast. Multiplanar CT image reconstructions and MIPs were obtained to evaluate the vascular anatomy. CONTRAST:  122mL OMNIPAQUE IOHEXOL 350 MG/ML SOLN COMPARISON:  Radiograph earlier today FINDINGS: Cardiovascular: Examination is positive for acute pulmonary embolus. Pulmonary arterial filling defects are seen in the right distal main pulmonary artery, extending  into the middle and lower lobar branches. Lower lobe thrombus is near occlusive. There is subsegmental thrombus in the right upper lobe pulmonary artery. No definite left-sided filling defects. There is right heart strain with RV to LV ratio of 1.5 as well as mild straightening of the intraventricular septum. Small pericardial effusion. Coronary artery calcifications are seen. Right chest port is in place, tip at the atrial caval junction. There are multiple right chest wall collaterals. No evidence of SVC occlusion. Moderate aortic atherosclerosis without aneurysm or dissection. Mediastinum/Nodes: No enlarged mediastinal or hilar lymph nodes. Decompressed esophagus. Thyroid gland not visualized. Lungs/Pleura: Peripheral opacities in the medial and lateral right lower lobe slightly triangular may represent pulmonary infarcts. There are patchy ground-glass nodular opacities in the posterior right upper lobe. Small right pleural effusion. Mild emphysema. No pulmonary mass. Upper Abdomen: Assessed on concurrent noncontrast abdominal CT, reported separately. There is minimal contrast refluxing into the hepatic veins and IVC. Musculoskeletal: Thoracic spondylosis with multilevel endplate spurring in the thoracic spine. No acute osseous abnormality or evidence of focal bone lesion. Review of the MIP images confirms the above findings. IMPRESSION: 1. Examination is positive for acute pulmonary  embolus with moderate clot burden and evidence of right heart strain, RV to LV ratio of 1.5. 2. Peripheral opacities in the medial and lateral right lower lobe may represent pulmonary infarcts. 3. Patchy ground-glass nodular opacities in the posterior right upper lobe may be infectious or inflammatory, including COVID-19 pneumonia. 4. Small right pleural effusion. 5. Mild emphysema. Aortic Atherosclerosis (ICD10-I70.0) and Emphysema (ICD10-J43.9). Critical Value/emergent results were called by telephone at the time of interpretation on 03/15/2021 at 4:11 pm to provider [facility] , who verbally acknowledged these results. Electronically Signed   By: Keith Rake M.D.   On: 03/15/2021 16:11   US Venous Img Lower Bilateral (DVT)  Result Date: 03/15/2021 CLINICAL DATA:  Known pulmonary emboli EXAM: BILATERAL LOWER EXTREMITY VENOUS DOPPLER ULTRASOUND TECHNIQUE: Gray-scale sonography with graded compression, as well as color Doppler and duplex ultrasound were performed to evaluate the lower extremity deep venous systems from the level of the common femoral vein and including the common femoral, femoral, profunda femoral, popliteal and calf veins including the posterior tibial, peroneal and gastrocnemius veins when visible. The superficial great saphenous vein was also interrogated. Spectral Doppler was utilized to evaluate flow at rest and with distal augmentation maneuvers in the common femoral, femoral and popliteal veins. COMPARISON:  None. FINDINGS: RIGHT LOWER EXTREMITY Common Femoral Vein: No evidence of thrombus. Normal compressibility, respiratory phasicity and response to augmentation. Saphenofemoral Junction: No evidence of thrombus. Normal compressibility and flow on color Doppler imaging. Profunda Femoral Vein: No evidence of thrombus. Normal compressibility and flow on color Doppler imaging. Femoral Vein: Thrombus is noted with decreased compressibility. Popliteal Vein: Thrombus is noted with decreased  compressibility. Calf Veins: No evidence of thrombus. Normal compressibility and flow on color Doppler imaging. Superficial Great Saphenous Vein: No evidence of thrombus. Normal compressibility. Venous Reflux:  None. Other Findings:  None. LEFT LOWER EXTREMITY Common Femoral Vein: No evidence of thrombus. Normal compressibility, respiratory phasicity and response to augmentation. Saphenofemoral Junction: No evidence of thrombus. Normal compressibility and flow on color Doppler imaging. Profunda Femoral Vein: No evidence of thrombus. Normal compressibility and flow on color Doppler imaging. Femoral Vein: No evidence of thrombus. Normal compressibility, respiratory phasicity and response to augmentation. Popliteal Vein: No evidence of thrombus. Normal compressibility, respiratory phasicity and response to augmentation. Calf Veins: No evidence of thrombus. Normal compressibility and  flow on color Doppler imaging. Superficial Great Saphenous Vein: No evidence of thrombus. Normal compressibility. Venous Reflux:  None. Other Findings:  None. IMPRESSION: Right popliteal and femoral vein deep venous thrombosis. Electronically Signed   By: Inez Catalina M.D.   On: 03/15/2021 19:58   CT Renal Stone Study  Result Date: 03/15/2021 CLINICAL DATA:  Hematuria. EXAM: CT ABDOMEN AND PELVIS WITHOUT CONTRAST TECHNIQUE: Multidetector CT imaging of the abdomen and pelvis was performed following the standard protocol without IV contrast. COMPARISON:  Renal ultrasound 08/05/2020 FINDINGS: Lower chest: Assessed on concurrent chest CT, reported separately. Hepatobiliary: No focal hepatic abnormality on noncontrast exam. Decompressed gallbladder. No calcified gallstone or pericholecystic inflammation. Pancreas: No ductal dilatation or inflammation. Spleen: Mild splenomegaly, spleen measuring 12.4 x 13.1 x 6.5 cm (volume = 550 cm^3). No focal abnormality. Adrenals/Urinary Tract: Normal adrenal glands. No hydronephrosis or renal calculi.  Calcifications at the renal hila are felt to be vascular. Minimal symmetric perinephric edema typically chronic. No evidence of focal renal lesion on this noncontrast exam. Partially distended urinary bladder. No bladder wall thickening or stone. Stomach/Bowel: Decompressed stomach. No small bowel obstruction or inflammation. Scattered colonic diverticulosis without diverticulitis. Appendectomy. Vascular/Lymphatic: Moderate aortic atherosclerosis. Bi-iliac stents in place. No bulky abdominopelvic adenopathy. Reproductive: Prostate is unremarkable. Other: No ascites or free air. Fat containing inguinal hernias, left greater than right. Musculoskeletal: No acute osseous abnormality or focal bone lesion. IMPRESSION: 1. No renal stones or obstructive uropathy. Calcifications at the renal hila are felt to be vascular. 2. Colonic diverticulosis without diverticulitis. 3. Mild splenomegaly. Aortic Atherosclerosis (ICD10-I70.0). Electronically Signed   By: Keith Rake M.D.   On: 03/15/2021 16:20   US Venous Img Lower Bilateral (DVT)  Final Result    CT Angio Chest PE W and/or Wo Contrast  Final Result    CT Renal Stone Study  Final Result    DG Chest 2 View  Final Result      Scheduled Meds: . Chlorhexidine Gluconate Cloth  6 each Topical Daily  . levothyroxine  175 mcg Oral Q0600  . simvastatin  40 mg Oral q1800  . venetoclax  100 mg Oral QID   PRN Meds: acetaminophen **OR** acetaminophen, ondansetron **OR** ondansetron (ZOFRAN) IV, sodium chloride flush Continuous Infusions: . heparin 1,350 Units/hr (03/16/21 0941)     LOS: 1 day  Time spent: Greater than 50% of the 35 minute visit was spent in counseling/coordination of care for the patient as laid out in the A&P.   Dwyane Dee, MD [facility] 03/16/2021, 2:08 PM

## 2021-03-16 NOTE — Progress Notes (Signed)
  Echocardiogram 2D Echocardiogram has been performed.  Victor [name] 03/16/2021, 3:47 PM

## 2021-03-16 NOTE — Progress Notes (Addendum)
ANTICOAGULATION CONSULT NOTE - Follow Up Consult  Pharmacy Consult for Heparin Indication: pulmonary embolus  No Known Allergies  Patient Measurements: Height: 5\' 10"  (177.8 cm) Weight: 94.3 kg (208 lb) IBW/kg (Calculated) : 73 Heparin Dosing Weight: 94kg   Vital Signs: Temp: 98.7 F (37.1 C) (05/12 0901) Temp Source: Oral (05/12 0901) BP: 123/62 (05/12 0901) Pulse Rate: 87 (05/12 0901)  Labs: Recent Labs    03/13/21 1132 03/15/21 1149 03/15/21 1626 03/15/21 1831 03/16/21 0033 03/16/21 0812  HGB 11.3* 11.7*  --   --  10.6*  --   HCT 33.9* 35.7*  --   --  32.9*  --   PLT 35* 26*  --   --  21*  --   HEPARINUNFRC  --   --   --   --  0.42 0.84*  CREATININE 0.98 1.01  --   --   --  0.89  TROPONINIHS  --   --  5 5  --   --     Estimated Creatinine Clearance: 72.5 mL/min (by C-G formula based on SCr of 0.89 mg/dL).  Assessment: Pt is an 25 YOM w/ hx of acute myeloid leukemia, COPD, CKD stage III, GERD, HDL, and vocal cord/thyroid cancer. He presented to the ED on 5/11 with c/o sharp, aching back pain for past 2 days and coughing.   Today 03/16/21 - Heparin level is supratherapeutic at 0.84 at 8hr check on 1500 units/hr heparin infusion - Hgb low-stable, plt low-stable   - No s/sx of bleeding, no infusion-related issues reported from nurse  Goal of Therapy:  Heparin level: 0.3-0.7 Monitor platelets by anticoagulation protocol: Yes   Plan:  - Decrease heparin to 1350 units/hr  - Repeat heparin level in 8h, daily heparin level - Monitor CBC (hgb, plt) - Monitor s/sx bleeding   Victor [name] 03/16/2021,10:00 AM   Victor [name], PharmD, [place] Please utilize Amion for appropriate phone number to reach the unit pharmacist ([place]) 03/16/2021 12:16 PM

## 2021-03-16 NOTE — Progress Notes (Signed)
PHARMACY BRIEF NOTE: Oral Chemotherapy  By [facility] policy, venetoclax is automatically held when any of the following laboratory values occur:  Venetoclax (Venclexta) Hold Criteria: . Hepatotoxicity . Blood chemistry changes (potassium, uric acid, phosphorus, calcium and SCr) or symptoms suggestive of tumor lysis syndrome (TLS); or clinical TLS . Renal impairment (CrCl <80 ml/min) at increased risk for TLS . Grade 3 or 4 neutropenia with infection or fever . Grade 4 hematologic toxicities (except lymphopenia) - Plt <25k . Moderate/Strong CYP3A inhibitors; P-gp inhibitors . Active infection  Venetoclax has been discontinued from profile per policy  Reuel Boom, PharmD, BCPS [phone] 03/16/2021, 5:17 PM

## 2021-03-16 NOTE — Progress Notes (Signed)
Dr Sabino Gasser gave nurse one time order for Tramadol until pharmacy finalizing Meds from home.

## 2021-03-16 NOTE — Consult Note (Signed)
Referral Victor [name]  Reason for Referral: Pulmonary embolism with right heart strain and right lower extremity DVT; acute myeloid leukemia  Chief Complaint  Patient presents with  . Back Pain  : I had pain in difficulty breathing.  HPI: [name] is a very nice 85 year old white male.  He has acute myeloid leukemia.  He is currently on therapy with Vidaza and venetoclax.  He has had 5 cycles of treatment with this combination.  He had a wonderful response to date.  He was having some discomfort with backslash chest pain.  He had a little bit of a cough.  He then had some shortness of breath.  He had no leg swelling.  He ultimately went to the emergency room.  He had a CT angiogram that was done.  This showed bilateral pulmonary emboli with right heart strain.  He had a Doppler of his legs which showed a thrombus in the right femoral/popliteal vein.  He was then admitted.  He is on IV heparin right now.  He has had no travel.  He has not been on bedrest.  Been incredibly active.  He does not smoke.  His labs yesterday show white cell count of 4.1.  Hemoglobin 11.7.  Platelet count 26,000.  His sodium is 138.  Potassium 3.8.  His BUN is 17 creatinine 1.01.  Calcium 8.2 with an albumin of 3.2.  He has had no fever.  He has had no change in bowel or bladder habits.  He has had a very good performance status.  Currently, his performance status is ECOG 1.    Past Medical History:  Diagnosis Date  . Arthritis   . Atherosclerosis   . Atherosclerotic PVD with intermittent claudication (HCC)    stent left leg  . BPH (benign prostatic hyperplasia)   . Bulging lumbar disc   . Cancer ([place]) 11/12/14   vocal cord  carcinoma in situ , radiation; thyroid cancer  . Carotid bruit   . Chronic kidney disease    chronic stage III  . COPD (chronic obstructive pulmonary disease) ([place])   . DDD (degenerative disc disease), lumbar   . Dysphonia   . Dysplasia of true vocal cord   . GERD  (gastroesophageal reflux disease)   . H/O carotid atherosclerosis    b/l  . Hyperlipidemia   . Hypothyroidism   . Occasional tremors    left hand managed with propranolol  . Peripheral vascular angioplasty status with implants and grafts   . Precancerous lesion 03/05/2018   premelanoma removed from back.   . Radiation 03/10/15- 04/18/16   Larynx  . Sleep apnea    hasn't used CPAP in years  . Spondylosis of lumbosacral region   . Thyroid disease   :  Past Surgical History:  Procedure Laterality Date  . APPENDECTOMY    . COLONOSCOPY W/ POLYPECTOMY    . DIODE LASER APPLICATION Left 56/97/9480   Procedure: DIODE LASER APPLICATION;  Surgeon: Victor [name], Victor [name];  Location: [facility];  Service: Ophthalmology;  Laterality: Left;  . IR IMAGING GUIDED PORT INSERTION  01/16/2021  . MEMBRANE PEEL Left 08/18/2019   Procedure: MEMBRANE PEEL;  Surgeon: Victor [name], Victor [name];  Location: [facility];  Service: Ophthalmology;  Laterality: Left;  . MOUTH SURGERY     tooth extraction  . PARS PLANA VITRECTOMY Left 08/18/2019   PARS PLANA VITRECTOMY 27 GAUGE (Left)  . PARS PLANA VITRECTOMY 27 GAUGE Left 08/18/2019   Procedure: PARS PLANA VITRECTOMY 27 GAUGE;  Surgeon: Victor [name], Victor [name];  Location: [facility];  Service: Ophthalmology;  Laterality: Left;  . PHOTOCOAGULATION WITH LASER Left 08/18/2019   Procedure: PHOTOCOAGULATION WITH LASER;  Surgeon: Victor [name], Victor [name];  Location: [facility];  Service: Ophthalmology;  Laterality: Left;  . THYROID SURGERY    . TONSILLECTOMY    . vocal cord biopsy  11/12/14   squamous cell carcinoma in situ  :   Current Facility-Administered Medications:  .  acetaminophen (TYLENOL) tablet 650 mg, 650 mg, Oral, Q6H PRN **OR** acetaminophen (TYLENOL) suppository 650 mg, 650 mg, Rectal, Q6H PRN, Victor Quill, DO .  Chlorhexidine Gluconate Cloth 2 % PADS 6 each, 6 each, Topical, Daily, Victor, [name], Victor [name] .  heparin ADULT infusion 100 units/mL (25000 units/214mL), 1,500 Units/hr,  Intravenous, Continuous, Victor [name], RPH, Last Rate: 15 mL/hr at 03/15/21 1702, 1,500 Units/hr at 03/15/21 1702 .  levothyroxine (SYNTHROID) tablet 175 mcg, 175 mcg, Oral, Q0600, Victor Quill, DO, 175 mcg at 03/16/21 0540 .  ondansetron (ZOFRAN) tablet 4 mg, 4 mg, Oral, Q6H PRN **OR** ondansetron (ZOFRAN) injection 4 mg, 4 mg, Intravenous, Q6H PRN, Victor [name], Victor M, DO .  simvastatin (ZOCOR) tablet 40 mg, 40 mg, Oral, q1800, Victor [name], Victor M, DO .  venetoclax [facility]) tablet 100 mg, 100 mg, Oral, QID, Victor [name], Victor M, DO  Facility-Administered Medications Ordered in Other Encounters:  .  sodium chloride flush (NS) 0.9 % injection 10 mL, 10 mL, Intracatheter, PRN, Victor [name], Victor [name], 10 mL at 11/03/20 1625 .  sodium chloride flush (NS) 0.9 % injection 10 mL, 10 mL, Intravenous, PRN, Victor, Victor M, NP, 10 mL at 02/20/21 0900:  . Chlorhexidine Gluconate Cloth  6 each Topical Daily  . levothyroxine  175 mcg Oral Q0600  . simvastatin  40 mg Oral q1800  . venetoclax  100 mg Oral QID  :  No Known Allergies:  Family History  Problem Relation Age of Onset  . Colon cancer Father   . Cancer Sister        Died of metastatic cancer  . Leukemia Neg Hx   :  Social History   Socioeconomic History  . Marital status: Married    Spouse name: Not on file  . Number of children: 2  . Years of education: Not on file  . Highest education level: Not on file  Occupational History  . Not on file  Tobacco Use  . Smoking status: Former Smoker    Packs/day: 2.00    Years: 51.00    Pack years: 102.00    Types: Cigarettes    Start date: 35    Quit date: 03/05/2004    Years since quitting: 17.0  . Smokeless tobacco: Never Used  Vaping Use  . Vaping Use: Never used  Substance and Sexual Activity  . Alcohol use: Yes    Alcohol/week: 7.0 standard drinks    Types: 7 Standard drinks or equivalent per week    Comment: 1 drink of rum per day  . Drug use: No  . Sexual activity: Not  on file  Other Topics Concern  . Not on file  Social History Narrative  . Not on file   Social Determinants of Health   Financial Resource Strain: Not on file  Food Insecurity: Not on file  Transportation Needs: Not on file  Physical Activity: Not on file  Stress: Not on file  Social Connections: Not on file  Intimate Partner Violence: Not on file  :  Review of  Systems  Constitutional: Negative.   HENT: Negative.   Eyes: Negative.   Respiratory: Negative.   Cardiovascular: Negative.   Gastrointestinal: Negative.   Genitourinary: Negative.   Musculoskeletal: Negative.   Skin: Negative.   Neurological: Negative.   Endo/Heme/Allergies: Negative.   Psychiatric/Behavioral: Negative.      Exam:  Physical Exam Vitals reviewed.  HENT:     Head: Normocephalic and atraumatic.  Eyes:     Pupils: Pupils are equal, round, and reactive to light.  Cardiovascular:     Rate and Rhythm: Normal rate and regular rhythm.     Heart sounds: Normal heart sounds.  Pulmonary:     Effort: Pulmonary effort is normal.     Breath sounds: Normal breath sounds.  Abdominal:     General: Bowel sounds are normal.     Palpations: Abdomen is soft.  Musculoskeletal:        General: No tenderness or deformity. Normal range of motion.     Cervical back: Normal range of motion.  Lymphadenopathy:     Cervical: No cervical adenopathy.  Skin:    General: Skin is warm and dry.     Findings: No erythema or rash.  Neurological:     Mental Status: He is alert and oriented to person, place, and time.  Psychiatric:        Behavior: Behavior normal.        Thought Content: Thought content normal.        Judgment: Judgment normal.    Patient Vitals for the past 24 hrs:  BP Temp Temp src Pulse Resp SpO2 Height Weight  03/16/21 0639 (!) 123/50 98.2 F (36.8 C) Oral 86 16 94 % -- --  03/16/21 0156 (!) 111/51 98.9 F (37.2 C) Oral 91 20 97 % -- --  03/15/21 2222 140/73 98.7 F (37.1 C) Oral 79 18 97  % -- --  03/15/21 2100 (!) 130/54 -- -- 81 19 94 % -- --  03/15/21 2030 (!) 134/109 -- -- 87 (!) 21 96 % -- --  03/15/21 2000 (!) 147/77 -- -- 87 (!) 28 91 % -- --  03/15/21 1930 (!) 117/34 -- -- 77 19 95 % -- --  03/15/21 1900 127/67 -- -- 87 (!) 23 95 % -- --  03/15/21 1830 132/68 -- -- 86 (!) 23 94 % -- --  03/15/21 1730 (!) 142/108 -- -- 72 19 99 % -- --  03/15/21 1623 119/65 -- -- 73 17 97 % -- --  03/15/21 1530 (!) 162/62 -- -- 76 18 98 % -- --  03/15/21 1415 (!) 122/37 -- -- 74 18 95 % -- --  03/15/21 1321 -- -- -- 77 18 98 % -- --  03/15/21 1320 (!) 118/104 97.9 F (36.6 C) Oral 77 16 98 % -- --  03/15/21 1057 129/90 98.6 F (37 C) -- 90 16 -- 5\' 10"  (1.778 [name]) 208 lb (94.3 kg)     Recent Labs    03/15/21 1149 03/16/21 0033  WBC 4.1 3.0*  HGB 11.7* 10.6*  HCT 35.7* 32.9*  PLT 26* 21*   Recent Labs    03/13/21 1132 03/15/21 1149  NA 137 138  K 3.6 3.8  CL 104 103  CO2 28 28  GLUCOSE 113* 97  BUN 23 17  CREATININE 0.98 1.01  CALCIUM 8.4* 8.2*    Blood smear review: None  Pathology: None    Assessment and Plan: Mr. Mendez is a very nice 85 year old white male.  He has acute myeloid leukemia.  He has had a very nice response to treatment.  He now has a thromboembolic event with pulmonary emboli and a right lower extremity thrombus.  You will need anticoagulation.  I realize that his platelet count is quite low.  Regardless, it is clear that his blood is coagulable.  I really do not think we have to do any type of hyper coagulability studies on him.  There is no family history of blood clots.  He is 85 years old.  He has never had a blood clot before.  We really can see thromboembolic events in patients with hematologic malignancies, despite low platelets.  Again I just bruised the point that his blood is coagulable.  I would keep him on heparin.  I realize his platelets are on the low side.  I would not transfuse platelets unless he is actively bleeding  or his platelet count is less than 10,000.  I just believe that heparin is a very effective anticoagulant for thromboembolic disease initially.  Ultimately, he will need to be put on oral anticoagulation.  I will have to decide which direction oral anticoagulant would be most appropriate for him.  I would probably recheck his CT angiogram and Doppler in about 2-3 months.  Again, he has responded very nicely to his treatments for the acute myeloid leukemia.  I know that he is getting outstanding care from all staff on [address].  I do appreciate their care and compassion.  Victor Haw, Victor [name]  Colossians 3:23

## 2021-03-16 NOTE — Plan of Care (Signed)
  Problem: Education: Goal: Knowledge of General Education information will improve Description: Including pain rating scale, medication(s)/side effects and non-pharmacologic comfort measures Outcome: Progressing   Problem: Clinical Measurements: Goal: Ability to maintain clinical measurements within normal limits will improve Outcome: Progressing Goal: Respiratory complications will improve Outcome: Progressing Goal: Cardiovascular complication will be avoided Outcome: Progressing   Problem: Coping: Goal: Level of anxiety will decrease Outcome: Progressing   Problem: Safety: Goal: Ability to remain free from injury will improve Outcome: Progressing

## 2021-03-16 NOTE — Progress Notes (Signed)
[facility] for heparin Indication: pulmonary embolus  No Known Allergies  Patient Measurements: Height: 5\' 10"  (177.8 cm) Weight: 94.3 kg (208 lb) IBW/kg (Calculated) : 73 Heparin Dosing Weight: 92 KG  Vital Signs: Temp: 98.7 F (37.1 C) (05/11 2222) Temp Source: Oral (05/11 2222) BP: 140/73 (05/11 2222) Pulse Rate: 79 (05/11 2222)  Labs: Recent Labs    03/13/21 1132 03/15/21 1149 03/15/21 1626 03/15/21 1831 03/16/21 0033  HGB 11.3* 11.7*  --   --  10.6*  HCT 33.9* 35.7*  --   --  32.9*  PLT 35* 26*  --   --  21*  HEPARINUNFRC  --   --   --   --  0.42  CREATININE 0.98 1.01  --   --   --   TROPONINIHS  --   --  5 5  --     Estimated Creatinine Clearance: 63.9 mL/min (by C-G formula based on SCr of 1.01 mg/dL).   Medical History: Past Medical History:  Diagnosis Date  . Arthritis   . Atherosclerosis   . Atherosclerotic PVD with intermittent claudication (HCC)    stent left leg  . BPH (benign prostatic hyperplasia)   . Bulging lumbar disc   . Cancer ([place]) 11/12/14   vocal cord  carcinoma in situ , radiation; thyroid cancer  . Carotid bruit   . Chronic kidney disease    chronic stage III  . COPD (chronic obstructive pulmonary disease) ([place])   . DDD (degenerative disc disease), lumbar   . Dysphonia   . Dysplasia of true vocal cord   . GERD (gastroesophageal reflux disease)   . H/O carotid atherosclerosis    b/l  . Hyperlipidemia   . Hypothyroidism   . Occasional tremors    left hand managed with propranolol  . Peripheral vascular angioplasty status with implants and grafts   . Precancerous lesion 03/05/2018   premelanoma removed from back.   . Radiation 03/10/15- 04/18/16   Larynx  . Sleep apnea    hasn't used CPAP in years  . Spondylosis of lumbosacral region   . Thyroid disease     Medications:   Assessment: 9 yom with history of acute myeloid leukemia, COPD, CKD stage III, GERD, HLD, vocal cord/tyroid cancer.  Patient presents with sharp pain upon inhalation and coughing. CT positive for PE with right heart strain. Current Hgb 11.7, Plt 26, and Scr 1.01. Will not bolus patient per heme/onc.   03/16/2021:  Initial heparin level 0.42- therapeutic on heparin 1500 units/hr  CBC- Hg 10.6, pltc 21- low & slightly decreased from baseline admission labs  No bleeding or infusion related concerns per RN  Goal of Therapy:  Heparin level 0.3-0.7 units/ml Monitor platelets by anticoagulation protocol: Yes   Plan:  Continue Heparin infusion at 1500 units/hr  Repeat heparin level in 8h Target low end of goal range (0.3-0.5) due to thrombocytopenia  Daily HL and CBC  Monitor for s/sx of bleeding  Netta Cedars, PharmD 03/16/2021 1:08 AM

## 2021-03-17 ENCOUNTER — Inpatient Hospital Stay (HOSPITAL_COMMUNITY): Payer: Medicare Other

## 2021-03-17 ENCOUNTER — Encounter (HOSPITAL_COMMUNITY): Payer: Self-pay | Admitting: Internal Medicine

## 2021-03-17 ENCOUNTER — Telehealth: Payer: Self-pay | Admitting: *Deleted

## 2021-03-17 DIAGNOSIS — I2609 Other pulmonary embolism with acute cor pulmonale: Secondary | ICD-10-CM

## 2021-03-17 DIAGNOSIS — I82401 Acute embolism and thrombosis of unspecified deep veins of right lower extremity: Secondary | ICD-10-CM

## 2021-03-17 DIAGNOSIS — I2729 Other secondary pulmonary hypertension: Secondary | ICD-10-CM

## 2021-03-17 DIAGNOSIS — C9501 Acute leukemia of unspecified cell type, in remission: Secondary | ICD-10-CM

## 2021-03-17 HISTORY — PX: IR IVC FILTER PLMT / S&I /IMG GUID/MOD SED: IMG701

## 2021-03-17 LAB — CBC
HCT: 29.8 % — ABNORMAL LOW (ref 39.0–52.0)
Hemoglobin: 9.6 g/dL — ABNORMAL LOW (ref 13.0–17.0)
MCH: 32.5 pg (ref 26.0–34.0)
MCHC: 32.2 g/dL (ref 30.0–36.0)
MCV: 101 fL — ABNORMAL HIGH (ref 80.0–100.0)
Platelets: 24 10*3/uL — CL (ref 150–400)
RBC: 2.95 MIL/uL — ABNORMAL LOW (ref 4.22–5.81)
RDW: 15.6 % — ABNORMAL HIGH (ref 11.5–15.5)
WBC: 1.7 10*3/uL — ABNORMAL LOW (ref 4.0–10.5)
nRBC: 0 % (ref 0.0–0.2)

## 2021-03-17 LAB — BASIC METABOLIC PANEL
Anion gap: 5 (ref 5–15)
BUN: 17 mg/dL (ref 8–23)
CO2: 29 mmol/L (ref 22–32)
Calcium: 8.2 mg/dL — ABNORMAL LOW (ref 8.9–10.3)
Chloride: 107 mmol/L (ref 98–111)
Creatinine, Ser: 0.84 mg/dL (ref 0.61–1.24)
GFR, Estimated: >60 mL/min (ref >60)
Glucose, Bld: 93 mg/dL (ref 70–99)
Potassium: 3.8 mmol/L (ref 3.5–5.1)
Sodium: 141 mmol/L (ref 135–145)

## 2021-03-17 LAB — HEPARIN LEVEL (UNFRACTIONATED): Heparin Unfractionated: 0.5 IU/mL (ref 0.30–0.70)

## 2021-03-17 LAB — MAGNESIUM: Magnesium: 2 mg/dL (ref 1.7–2.4)

## 2021-03-17 MED ORDER — LIDOCAINE HCL 1 % IJ SOLN
INTRAMUSCULAR | Status: AC
  Filled 2021-03-17: qty 20

## 2021-03-17 MED ORDER — APIXABAN 5 MG PO TABS
5.0000 mg | ORAL_TABLET | Freq: Two times a day (BID) | ORAL | Status: DC
  Administered 2021-03-17 – 2021-03-18 (×2): 5 mg via ORAL
  Filled 2021-03-17 (×2): qty 1

## 2021-03-17 MED ORDER — FENTANYL CITRATE (PF) 100 MCG/2ML IJ SOLN
INTRAMUSCULAR | Status: DC | PRN
  Administered 2021-03-17 (×2): 50 ug via INTRAVENOUS

## 2021-03-17 MED ORDER — APIXABAN 5 MG PO TABS: 5.0000 mg | ORAL_TABLET | Freq: Two times a day (BID) | ORAL | Status: DC

## 2021-03-17 MED ORDER — FENTANYL CITRATE (PF) 100 MCG/2ML IJ SOLN
INTRAMUSCULAR | Status: AC
  Filled 2021-03-17: qty 2

## 2021-03-17 MED ORDER — LIDOCAINE HCL (PF) 1 % IJ SOLN
INTRAMUSCULAR | Status: DC | PRN
  Administered 2021-03-17: 5 mL

## 2021-03-17 NOTE — Progress Notes (Signed)
ANTICOAGULATION CONSULT NOTE - Follow Up Consult  Pharmacy Consult for Heparin Indication: acute pulmonary embolus, DVT  No Known Allergies  Patient Measurements: Height: 5\' 10"  (177.8 cm) Weight: 94.3 kg (208 lb) IBW/kg (Calculated) : 73 Heparin Dosing Weight: 94 kg  Vital Signs: Temp: 97.9 F (36.6 C) (05/13 0521) BP: 128/59 (05/13 0521) Pulse Rate: 80 (05/13 0521)  Labs: Recent Labs    03/15/21 1149 03/15/21 1149 03/15/21 1626 03/15/21 1831 03/16/21 0033 03/16/21 0812 03/16/21 1825 03/17/21 0550  HGB 11.7*  --   --   --  10.6*  --   --  9.6*  HCT 35.7*  --   --   --  32.9*  --   --  29.8*  PLT 26*  --   --   --  21*  --   --  24*  HEPARINUNFRC  --    < >  --   --  0.42 0.84* 0.53 0.50  CREATININE 1.01  --   --   --   --  0.89  --  0.84  TROPONINIHS  --   --  5 5  --   --   --   --    < > = values in this interval not displayed.    Estimated Creatinine Clearance: 76.8 mL/min (by C-G formula based on SCr of 0.84 mg/dL).   Assessment: Pt is an 29 YOM with hx of acute myeloid leukemia, COPD, CKD stage III, GERD, HDL, and vocal cord/thyroid cancer. Pt is currently undergoing chemotherapy treatment with Vidaza and Venclexta. He presented to the ED on 5/11 with c/o sharp, aching back pain for the past 2 days, coughing, and SOB. Chest CT on 5/11 showed acute PE with evidence of right heart strain and LE Doppler showed acute right lower extremity DVT. Pt is currently on Heparin drip for VTE treatment.  Today 03/17/21  - Heparin level is therapeutic 0.5 with drip currently infusing at 1350 units/hr - Hgb remains low but stable, plt low at 24K - No s/sx of bleeding or infusion-related issues documented  Goal of Therapy:  Goal: 0.3-0.5 (d/t low platelets) Monitor platelets by anticoagulation protocol: Yes   Plan:  - Continue 1350 units/hr - Daily heparin levels and CBC - Monitor s/sx of bleeding   Debara Pickett 03/17/2021,8:03 AM

## 2021-03-17 NOTE — Care Management Important Message (Signed)
Important Message  Patient Details IM Letter given to the Patient. Name: Victor [name] MRN: [id number] Date of Birth: September 01, 1936   Medicare Important Message Given:  Yes     Kerin Salen 03/17/2021, 1:27 PM

## 2021-03-17 NOTE — Telephone Encounter (Signed)
Received call from patients wife Aram Beecham asking for Clarification on whether patient should restart Venclexta.  Yes per Dr [name] but restart when he comes home from the hospital.  Patient follows up with Dr [name] on Monday.  Wife appreciative of call

## 2021-03-17 NOTE — H&P (Addendum)
Chief Complaint: Pulmonary embolism  Referring Physician(s): Ennever  Supervising Physician: Aletta Edouard  Patient Status: [facility] - In-pt  History of Present Illness: Victor [name] is a 85 y.o. male with acute myeloid leukemia, COPD, CKD stage III (creatinine currently 0.84), and history of vocal cord and thyroid cancer.  He presented to the ED on 03/15/21 c/o back pain which was worse with inspiration.  He also reported some LE edema but stated it had been present for several months.  Workup included CTA which was positive for acute pulmonary embolus with moderate clot burden.  He has DVT in the right popliteal vein.  He has thrombocytopenia and per Dr. [name] he is NOT a candidate for long term anticoagulation.  His platelets are 24K.  We are asked to place an inferior vena cava filter.  He has a  Right IJ Port A Cath in place. This was placed at [facility] by Vikki Ports, PA-C  Past Medical History:  Diagnosis Date  . Arthritis   . Atherosclerosis   . Atherosclerotic PVD with intermittent claudication (HCC)    stent left leg  . BPH (benign prostatic hyperplasia)   . Bulging lumbar disc   . Cancer ([place]) 11/12/14   vocal cord  carcinoma in situ , radiation; thyroid cancer  . Carotid bruit   . Chronic kidney disease    chronic stage III  . COPD (chronic obstructive pulmonary disease) ([place])   . DDD (degenerative disc disease), lumbar   . Dysphonia   . Dysplasia of true vocal cord   . GERD (gastroesophageal reflux disease)   . H/O carotid atherosclerosis    b/l  . Hyperlipidemia   . Hypothyroidism   . Occasional tremors    left hand managed with propranolol  . Peripheral vascular angioplasty status with implants and grafts   . Precancerous lesion 03/05/2018   premelanoma removed from back.   . Radiation 03/10/15- 04/18/16   Larynx  . Sleep apnea    hasn't used CPAP in years  . Spondylosis of lumbosacral region   . Thyroid disease     Past Surgical  History:  Procedure Laterality Date  . APPENDECTOMY    . COLONOSCOPY W/ POLYPECTOMY    . DIODE LASER APPLICATION Left 10/62/6948   Procedure: DIODE LASER APPLICATION;  Surgeon: Hayden Pedro, MD;  Location: [place];  Service: Ophthalmology;  Laterality: Left;  . IR IMAGING GUIDED PORT INSERTION  01/16/2021  . MEMBRANE PEEL Left 08/18/2019   Procedure: MEMBRANE PEEL;  Surgeon: Hayden Pedro, MD;  Location: [place];  Service: Ophthalmology;  Laterality: Left;  . MOUTH SURGERY     tooth extraction  . PARS PLANA VITRECTOMY Left 08/18/2019   PARS PLANA VITRECTOMY 27 GAUGE (Left)  . PARS PLANA VITRECTOMY 27 GAUGE Left 08/18/2019   Procedure: PARS PLANA VITRECTOMY 27 GAUGE;  Surgeon: Hayden Pedro, MD;  Location: [place];  Service: Ophthalmology;  Laterality: Left;  . PHOTOCOAGULATION WITH LASER Left 08/18/2019   Procedure: PHOTOCOAGULATION WITH LASER;  Surgeon: Hayden Pedro, MD;  Location: [place];  Service: Ophthalmology;  Laterality: Left;  . THYROID SURGERY    . TONSILLECTOMY    . vocal cord biopsy  11/12/14   squamous cell carcinoma in situ    Allergies: Patient has no known allergies.  Medications: Prior to Admission medications   Medication Sig Start Date End Date Taking? Authorizing Provider  acetaminophen (TYLENOL) 500 MG tablet Take 1,000 mg by mouth every 6 (six) hours  as needed for moderate pain. 08/23/20  Yes [provider]  doxazosin (CARDURA) 2 MG tablet Take 1 tablet (2 mg total) by mouth at bedtime. 02/26/20  Yes Cirigliano, Mary K, DO  fluticasone (FLONASE) 50 MCG/ACT nasal spray Place 2 sprays into the nose 2 (two) times daily as needed for allergies.   Yes [provider]  ibuprofen (ADVIL) 200 MG tablet Take 400 mg by mouth every 6 (six) hours as needed for mild pain. 08/23/20  Yes [provider]  levothyroxine (SYNTHROID) 175 MCG tablet Take 175 mcg by mouth daily. 08/15/20  Yes [provider]  lidocaine-prilocaine (EMLA) cream  Apply 1 application topically as needed for up to 30 doses. 01/23/21  Yes Volanda Napoleon, MD  Loratadine (CLARITIN PO) Take 10 mg by mouth at bedtime. Allertec   Yes [provider]  Melatonin 5 MG CHEW Chew 1 tablet by mouth daily as needed (sleep).   Yes [provider]  potassium chloride (MICRO-K) 10 MEQ CR capsule Take 10 mEq by mouth in the morning and at bedtime. 09/27/20 09/27/21 Yes [provider]  propranolol ER (INDERAL LA) 80 MG 24 hr capsule Take 1 capsule (80 mg total) by mouth daily. 02/26/20  Yes Cirigliano, Mary K, DO  simvastatin (ZOCOR) 40 MG tablet TAKE ONE TABLET BY MOUTH DAILY AT BEDTIME Patient taking differently: Take 40 mg by mouth daily at 6 PM. 02/23/21  Yes Cirigliano, Mary K, DO  venetoclax 100 MG TABS Take 100 mg by mouth in the morning, at noon, in the evening, and at bedtime. 08/15/20  Yes [provider]  acyclovir (ZOVIRAX) 400 MG tablet Take 1 tablet (400 mg total) by mouth 2 (two) times daily as needed. 11/24/20   Owens Shark, NP  hydrALAZINE (APRESOLINE) 25 MG tablet Take 1 tablet (25 mg total) by mouth 3 (three) times daily as needed. Take if standing BP >140/80 mm Hg 12/05/20 03/05/21  [name], MD     Family History  Problem Relation Age of Onset  . Colon cancer Father   . Cancer Sister        Died of metastatic cancer  . Leukemia Neg Hx     Social History   Socioeconomic History  . Marital status: Married    Spouse name: Not on file  . Number of children: 2  . Years of education: Not on file  . Highest education level: Not on file  Occupational History  . Not on file  Tobacco Use  . Smoking status: Former Smoker    Packs/day: 2.00    Years: 51.00    Pack years: 102.00    Types: Cigarettes    Start date: 27    Quit date: 03/05/2004    Years since quitting: 17.0  . Smokeless tobacco: Never Used  Vaping Use  . Vaping Use: Never used  Substance and Sexual Activity  . Alcohol use: Yes    Alcohol/week:  7.0 standard drinks    Types: 7 Standard drinks or equivalent per week    Comment: 1 drink of rum per day  . Drug use: No  . Sexual activity: Not on file  Other Topics Concern  . Not on file  Social History Narrative  . Not on file   Social Determinants of Health   Financial Resource Strain: Not on file  Food Insecurity: Not on file  Transportation Needs: Not on file  Physical Activity: Not on file  Stress: Not on file  Social  Connections: Not on file     Review of Systems: A 12 point ROS discussed and pertinent positives are indicated in the HPI above.  All other systems are negative.  Review of Systems  Vital Signs: BP (!) 128/59 (BP Location: Left Arm)   Pulse 80   Temp 97.9 F (36.6 C)   Resp 16   Ht 5\' 10"  (1.778 m)   Wt 94.3 kg   SpO2 93%   BMI 29.84 kg/m   Physical Exam Vitals reviewed.  Constitutional:      Appearance: Normal appearance.  HENT:     Head: Normocephalic and atraumatic.  Eyes:     Extraocular Movements: Extraocular movements intact.  Cardiovascular:     Rate and Rhythm: Normal rate and regular rhythm.  Pulmonary:     Effort: Pulmonary effort is normal. No respiratory distress.     Breath sounds: Normal breath sounds.  Abdominal:     General: There is no distension.     Palpations: Abdomen is soft.     Tenderness: There is no abdominal tenderness.  Musculoskeletal:        General: Normal range of motion.     Cervical back: Normal range of motion.  Skin:    General: Skin is warm and dry.  Neurological:     General: No focal deficit present.     Mental Status: He is alert and oriented to person, place, and time.  Psychiatric:        Mood and Affect: Mood normal.        Behavior: Behavior normal.        Thought Content: Thought content normal.        Judgment: Judgment normal.     Imaging: DG Chest 2 View  Result Date: 03/15/2021 CLINICAL DATA:  Shortness of breath EXAM: CHEST - 2 VIEW COMPARISON:  08/04/2020 FINDINGS: Dual  lumen right chest port terminates in the region of the superior vena cava. The heart size is within normal limits. No pulmonary vascular congestion. The lungs are hyperexpanded. Linear opacities at the lung bases likely due to atelectasis or scarring. IMPRESSION: No acute cardiopulmonary process. Electronically Signed   By: Miachel Roux M.D.   On: 03/15/2021 12:33   CT Angio Chest PE W and/or Wo Contrast  Result Date: 03/15/2021 CLINICAL DATA:  85 year old with back pain.  Cough. EXAM: CT ANGIOGRAPHY CHEST WITH CONTRAST TECHNIQUE: Multidetector CT imaging of the chest was performed using the standard protocol during bolus administration of intravenous contrast. Multiplanar CT image reconstructions and MIPs were obtained to evaluate the vascular anatomy. CONTRAST:  177mL OMNIPAQUE IOHEXOL 350 MG/ML SOLN COMPARISON:  Radiograph earlier today FINDINGS: Cardiovascular: Examination is positive for acute pulmonary embolus. Pulmonary arterial filling defects are seen in the right distal main pulmonary artery, extending into the middle and lower lobar branches. Lower lobe thrombus is near occlusive. There is subsegmental thrombus in the right upper lobe pulmonary artery. No definite left-sided filling defects. There is right heart strain with RV to LV ratio of 1.5 as well as mild straightening of the intraventricular septum. Small pericardial effusion. Coronary artery calcifications are seen. Right chest port is in place, tip at the atrial caval junction. There are multiple right chest wall collaterals. No evidence of SVC occlusion. Moderate aortic atherosclerosis without aneurysm or dissection. Mediastinum/Nodes: No enlarged mediastinal or hilar lymph nodes. Decompressed esophagus. Thyroid gland not visualized. Lungs/Pleura: Peripheral opacities in the medial and lateral right lower lobe slightly triangular may represent pulmonary infarcts. There are  patchy ground-glass nodular opacities in the posterior right upper  lobe. Small right pleural effusion. Mild emphysema. No pulmonary mass. Upper Abdomen: Assessed on concurrent noncontrast abdominal CT, reported separately. There is minimal contrast refluxing into the hepatic veins and IVC. Musculoskeletal: Thoracic spondylosis with multilevel endplate spurring in the thoracic spine. No acute osseous abnormality or evidence of focal bone lesion. Review of the MIP images confirms the above findings. IMPRESSION: 1. Examination is positive for acute pulmonary embolus with moderate clot burden and evidence of right heart strain, RV to LV ratio of 1.5. 2. Peripheral opacities in the medial and lateral right lower lobe may represent pulmonary infarcts. 3. Patchy ground-glass nodular opacities in the posterior right upper lobe may be infectious or inflammatory, including COVID-19 pneumonia. 4. Small right pleural effusion. 5. Mild emphysema. Aortic Atherosclerosis (ICD10-I70.0) and Emphysema (ICD10-J43.9). Critical Value/emergent results were called by telephone at the time of interpretation on 03/15/2021 at 4:11 pm to provider [facility] , who verbally acknowledged these results. Electronically Signed   By: Keith Rake M.D.   On: 03/15/2021 16:11   US Venous Img Lower Bilateral (DVT)  Result Date: 03/15/2021 CLINICAL DATA:  Known pulmonary emboli EXAM: BILATERAL LOWER EXTREMITY VENOUS DOPPLER ULTRASOUND TECHNIQUE: Gray-scale sonography with graded compression, as well as color Doppler and duplex ultrasound were performed to evaluate the lower extremity deep venous systems from the level of the common femoral vein and including the common femoral, femoral, profunda femoral, popliteal and calf veins including the posterior tibial, peroneal and gastrocnemius veins when visible. The superficial great saphenous vein was also interrogated. Spectral Doppler was utilized to evaluate flow at rest and with distal augmentation maneuvers in the common femoral, femoral and popliteal veins.  COMPARISON:  None. FINDINGS: RIGHT LOWER EXTREMITY Common Femoral Vein: No evidence of thrombus. Normal compressibility, respiratory phasicity and response to augmentation. Saphenofemoral Junction: No evidence of thrombus. Normal compressibility and flow on color Doppler imaging. Profunda Femoral Vein: No evidence of thrombus. Normal compressibility and flow on color Doppler imaging. Femoral Vein: Thrombus is noted with decreased compressibility. Popliteal Vein: Thrombus is noted with decreased compressibility. Calf Veins: No evidence of thrombus. Normal compressibility and flow on color Doppler imaging. Superficial Great Saphenous Vein: No evidence of thrombus. Normal compressibility. Venous Reflux:  None. Other Findings:  None. LEFT LOWER EXTREMITY Common Femoral Vein: No evidence of thrombus. Normal compressibility, respiratory phasicity and response to augmentation. Saphenofemoral Junction: No evidence of thrombus. Normal compressibility and flow on color Doppler imaging. Profunda Femoral Vein: No evidence of thrombus. Normal compressibility and flow on color Doppler imaging. Femoral Vein: No evidence of thrombus. Normal compressibility, respiratory phasicity and response to augmentation. Popliteal Vein: No evidence of thrombus. Normal compressibility, respiratory phasicity and response to augmentation. Calf Veins: No evidence of thrombus. Normal compressibility and flow on color Doppler imaging. Superficial Great Saphenous Vein: No evidence of thrombus. Normal compressibility. Venous Reflux:  None. Other Findings:  None. IMPRESSION: Right popliteal and femoral vein deep venous thrombosis. Electronically Signed   By: Inez Catalina M.D.   On: 03/15/2021 19:58   ECHOCARDIOGRAM COMPLETE  Result Date: 03/16/2021    ECHOCARDIOGRAM REPORT   Patient Name:   MAVEN VARELAS Date of Exam: 03/16/2021 Medical Rec #:  [id number]      Height:       70.0 in Accession #:    [id number]     Weight:       208.0 lb Date of Birth:   08/31/1936      BSA:  2.122 m Patient Age:    89 years       BP:           129/59 mmHg Patient Gender: M              HR:           72 bpm. Exam Location:  Inpatient Procedure: 2D Echo, Cardiac Doppler, Color Doppler and Intracardiac            Opacification Agent Indications:    I26.02 Pulmonary embolus  History:        Patient has prior history of Echocardiogram examinations, most                 recent 07/17/2019. COPD, Signs/Symptoms:Dyspnea; Risk                 Factors:Dyslipidemia and Sleep Apnea. Cancer. CKD.                 Hypothyroidism.  Sonographer:    Jonelle Sidle Dance Referring Phys: [place]  1. Left ventricular ejection fraction, by estimation, is 55 to 60%. The left ventricle has normal function. The left ventricle has no regional wall motion abnormalities. Left ventricular diastolic parameters are consistent with Grade I diastolic dysfunction (impaired relaxation).  2. Right ventricular systolic function is normal. The right ventricular size is normal.  3. The mitral valve is grossly normal. No evidence of mitral valve regurgitation.  4. The aortic valve is grossly normal. Aortic valve regurgitation is not visualized. FINDINGS  Left Ventricle: Left ventricular ejection fraction, by estimation, is 55 to 60%. The left ventricle has normal function. The left ventricle has no regional wall motion abnormalities. Definity contrast agent was given IV to delineate the left ventricular  endocardial borders. The left ventricular internal cavity size was normal in size. There is borderline left ventricular hypertrophy. Left ventricular diastolic parameters are consistent with Grade I diastolic dysfunction (impaired relaxation). Right Ventricle: The right ventricular size is normal. No increase in right ventricular wall thickness. Right ventricular systolic function is normal. Left Atrium: Left atrial size was normal in size. Right Atrium: Right atrial size was normal in size.  Pericardium: There is no evidence of pericardial effusion. Mitral Valve: The mitral valve is grossly normal. No evidence of mitral valve regurgitation. Tricuspid Valve: The tricuspid valve is grossly normal. Tricuspid valve regurgitation is not demonstrated. Aortic Valve: The aortic valve is grossly normal. Aortic valve regurgitation is not visualized. Pulmonic Valve: The pulmonic valve was normal in structure. Pulmonic valve regurgitation is not visualized. Aorta: The aortic root and ascending aorta are structurally normal, with no evidence of dilitation. IAS/Shunts: The atrial septum is grossly normal.  LEFT VENTRICLE PLAX 2D LVIDd:         4.20 cm  Diastology LVIDs:         3.10 cm  LV e' medial:    5.55 cm/s LV PW:         1.10 cm  LV E/e' medial:  16.2 LV IVS:        1.20 cm  LV e' lateral:   6.85 cm/s LVOT diam:     2.30 cm  LV E/e' lateral: 13.1 LV SV:         104 LV SV Index:   49 LVOT Area:     4.15 cm  RIGHT VENTRICLE             IVC RV Basal diam:  2.80 cm  IVC diam: 1.50 cm RV S prime:     10.70 cm/s TAPSE (M-mode): 2.3 cm LEFT ATRIUM             Index       RIGHT ATRIUM           Index LA diam:        4.80 cm 2.26 cm/m  RA Area:     15.00 cm LA Vol (A2C):   64.0 ml 30.15 ml/m RA Volume:   39.30 ml  18.52 ml/m LA Vol (A4C):   68.8 ml 32.42 ml/m LA Biplane Vol: 66.8 ml 31.47 ml/m  AORTIC VALVE LVOT Vmax:   97.00 cm/s LVOT Vmean:  76.000 cm/s LVOT VTI:    0.251 m  AORTA Ao Root diam: 3.70 cm Ao Asc diam:  2.90 cm MITRAL VALVE MV Area (PHT): 2.29 cm     SHUNTS MV Decel Time: 331 msec     Systemic VTI:  0.25 m MV E velocity: 90.00 cm/s   Systemic Diam: 2.30 cm MV A velocity: 127.00 cm/s MV E/A ratio:  0.71 Mertie Moores MD Electronically signed by Mertie Moores MD Signature Date/Time: 03/16/2021/4:49:09 PM    Final    CT Renal Stone Study  Result Date: 03/15/2021 CLINICAL DATA:  Hematuria. EXAM: CT ABDOMEN AND PELVIS WITHOUT CONTRAST TECHNIQUE: Multidetector CT imaging of the abdomen and pelvis  was performed following the standard protocol without IV contrast. COMPARISON:  Renal ultrasound 08/05/2020 FINDINGS: Lower chest: Assessed on concurrent chest CT, reported separately. Hepatobiliary: No focal hepatic abnormality on noncontrast exam. Decompressed gallbladder. No calcified gallstone or pericholecystic inflammation. Pancreas: No ductal dilatation or inflammation. Spleen: Mild splenomegaly, spleen measuring 12.4 x 13.1 x 6.5 cm (volume = 550 cm^3). No focal abnormality. Adrenals/Urinary Tract: Normal adrenal glands. No hydronephrosis or renal calculi. Calcifications at the renal hila are felt to be vascular. Minimal symmetric perinephric edema typically chronic. No evidence of focal renal lesion on this noncontrast exam. Partially distended urinary bladder. No bladder wall thickening or stone. Stomach/Bowel: Decompressed stomach. No small bowel obstruction or inflammation. Scattered colonic diverticulosis without diverticulitis. Appendectomy. Vascular/Lymphatic: Moderate aortic atherosclerosis. Bi-iliac stents in place. No bulky abdominopelvic adenopathy. Reproductive: Prostate is unremarkable. Other: No ascites or free air. Fat containing inguinal hernias, left greater than right. Musculoskeletal: No acute osseous abnormality or focal bone lesion. IMPRESSION: 1. No renal stones or obstructive uropathy. Calcifications at the renal hila are felt to be vascular. 2. Colonic diverticulosis without diverticulitis. 3. Mild splenomegaly. Aortic Atherosclerosis (ICD10-I70.0). Electronically Signed   By: Keith Rake M.D.   On: 03/15/2021 16:20    Labs:  CBC: Recent Labs    03/13/21 1132 03/15/21 1149 03/16/21 0033 03/17/21 0550  WBC 4.6 4.1 3.0* 1.7*  HGB 11.3* 11.7* 10.6* 9.6*  HCT 33.9* 35.7* 32.9* 29.8*  PLT 35* 26* 21* 24*    COAGS: Recent Labs    08/04/20 1928 08/05/20 0012  INR 1.4* 1.3*  APTT 51* 44*    BMP: Recent Labs    08/04/20 1523 08/05/20 0526 08/05/20 1501  08/06/20 0916 08/22/20 1014 03/13/21 1132 03/15/21 1149 03/16/21 0812 03/17/21 0550  NA 140 142 142 142   < > 137 138 140 141  K 4.0 3.7 3.8 3.8   < > 3.6 3.8 3.8 3.8  CL 103 108 109 109   < > 104 103 106 107  CO2 20* 19* 20* 23   < > 28 28 29 29   GLUCOSE 124* 108* 113* 116*   < >  113* 97 95 93  BUN 52* 48* 43* 30*   < > 23 17 14 17   CALCIUM 7.0* 6.5* 6.6* 6.5*   < > 8.4* 8.2* 8.6* 8.2*  CREATININE 2.48* 2.06* 1.71* 1.43*   < > 0.98 1.01 0.89 0.84  GFRNONAA 23* 29* 36* 45*   < > >60 >60 >60 >60  GFRAA 27* 34* 42* 52*  --   --   --   --   --    < > = values in this interval not displayed.    LIVER FUNCTION TESTS: Recent Labs    03/06/21 0920 03/09/21 0858 03/13/21 1132 03/15/21 1149  BILITOT 0.5 0.5 0.7 0.8  AST 12* 11* 9* 13*  ALT 6 10 11 15   ALKPHOS 61 49 46 47  PROT 5.5* 5.1* 4.6* 5.6*  ALBUMIN 3.9 3.8 3.4* 3.2*    TUMOR MARKERS: No results for input(s): AFPTM, CEA, CA199, CHROMGRNA in the last 8760 hours.  Assessment and Plan:  Acute Pulmonary Embolism in the setting of leukemia and thrombocytopenia.  Not a good candidate for long term anticoagulation.  Will proceed with venogram and placement of an inferior vena cava filter.  Thank you for this interesting consult.  I greatly enjoyed meeting STEWARD SAMES and look forward to participating in their care.  A copy of this report was sent to the requesting provider on this date.  Electronically Signed: Murrell Redden, PA-C   03/17/2021, 9:54 AM      I spent a total of 20 Minutes in face to face in clinical consultation, greater than 50% of which was counseling/coordinating care for IVC filter placment.

## 2021-03-17 NOTE — Progress Notes (Signed)
Progress Note    Victor [name]   QPY:[id number]  DOB: 08-07-1936  DOA: 03/15/2021     2  PCP: Ronnald Nian, DO  CC: back pain  Hospital Course: Victor [name] is AN 85 year old male with PMH AML (follows with Dr. [name]), COPD, vocal cord and thyroid cancer in remission, thrombocytopenia.  He presented to the hospital with complaints of right lower back pain that has been ongoing for approximately 2 days prior to admission.  He also had endorsed slight worsening shortness of breath with minimal exertion. After further discussion with patient and his wife, he did endorse that he has not been as active as he typically is for the past several weeks to months but he denied being very sedentary either or just laying around.  He underwent work up on admission with CT renal stone study, CTA chest, and CXR.  There was mild bilateral perinephric edema noted on initial CT.  He also underwent CTA chest and was found to have a right-sided PE.  This was followed by lower extremity duplex which also showed right popliteal and femoral DVTs.  He was started on a heparin drip.  Oncology was also consulted and followed patient while hospitalized.  Interval History:  No events overnight.  No bleeding overnight either.  Patient seen by oncology this morning and his case was discussed with his leukemia specialist at [facility].  He was recommended to undergo IVC filter placement.  Radiology was consulted for this as well.  ROS: Constitutional: negative for chills and fevers, Respiratory: negative for cough, Cardiovascular: negative for chest pain and Gastrointestinal: negative for abdominal pain  Assessment & Plan:  Acute PE Acute RLE DVT - right sided PE and right popliteal/femoral DVT - Etiology likely in setting of underlying AML and possibly from some decrease in mobility recently - Oncology also following; IVC filter recommended per Dr. Linus Orn at [facility] (concern was for patient being on long term AC 2/2  thrombocytopenia) - radiology consult placed for IVC filter placement  - Continue heparin drip and monitor for any signs of bleeding - will transition to Eliquis most likely after IVC placement  - CBC in am - echo negative for right heart strain; LV function preserved   Right lower back pain - unclear etiology; possibly strain vs perinephric edema seen on CT vs infection - UA essentially neg; UCx unremarkable; his CVA tenderness was B/L on exam but more pronounced on the right - hold off on abx and treat supportively for now - repeat UA if develops fever   AML - home Venclexta on hold while PLTC<25 per hospital/pharmacy policy -Continue following with oncology outpatient   Hypothyroidism - Continue Synthroid  Hypertension - Blood pressure remaining stable, will resume propranolol - Continue holding doxazosin  Old records reviewed in assessment of this patient  Antimicrobials:   DVT prophylaxis: Heparin drip    Code Status:   Code Status: Full Code Family Communication: wife  Disposition Plan: Status is: Inpatient  Remains inpatient appropriate because:IV treatments appropriate due to intensity of illness or inability to take PO and Inpatient level of care appropriate due to severity of illness   Dispo: The patient is from: Home              Anticipated d/c is to: Home              Patient currently is not medically stable to d/c.   Difficult to place patient No  Risk of unplanned readmission score: Unplanned  Admission- Pilot do not use: 13.2   Objective: Blood pressure (!) 119/56, pulse 74, temperature 98.7 F (37.1 C), temperature source Oral, resp. rate 17, height 5\' 10"  (1.778 m), weight 94.3 kg, SpO2 98 %.  Examination: General appearance: alert, cooperative and no distress Head: Normocephalic, without obvious abnormality, atraumatic Eyes: EOMI Lungs: clear to auscultation bilaterally Heart: regular rate and rhythm and S1, S2 normal Abdomen: normal  findings: bowel sounds normal and soft, non-tender  Back: R>L CVA TTP Extremities: RLE edema appreciated Skin: mobility and turgor normal Neurologic: Grossly normal  Consultants:   Oncology  IR  Procedures:   n/a  Data Reviewed: I have personally reviewed following labs and imaging studies Results for orders placed or performed during the hospital encounter of 03/15/21 (from the past 24 hour(s))  Heparin level (unfractionated)     Status: None   Collection Time: 03/16/21  6:25 PM  Result Value Ref Range   Heparin Unfractionated 0.53 0.30 - 0.70 IU/mL  CBC     Status: Abnormal   Collection Time: 03/17/21  5:50 AM  Result Value Ref Range   WBC 1.7 (L) 4.0 - 10.5 K/uL   RBC 2.95 (L) 4.22 - 5.81 MIL/uL   Hemoglobin 9.6 (L) 13.0 - 17.0 g/dL   HCT 29.8 (L) 39.0 - 52.0 %   MCV 101.0 (H) 80.0 - 100.0 fL   MCH 32.5 26.0 - 34.0 pg   MCHC 32.2 30.0 - 36.0 g/dL   RDW 15.6 (H) 11.5 - 15.5 %   Platelets 24 (LL) 150 - 400 K/uL   nRBC 0.0 0.0 - 0.2 %  Heparin level (unfractionated)     Status: None   Collection Time: 03/17/21  5:50 AM  Result Value Ref Range   Heparin Unfractionated 0.50 0.30 - 0.70 IU/mL  Basic metabolic panel     Status: Abnormal   Collection Time: 03/17/21  5:50 AM  Result Value Ref Range   Sodium 141 135 - 145 mmol/L   Potassium 3.8 3.5 - 5.1 mmol/L   Chloride 107 98 - 111 mmol/L   CO2 29 22 - 32 mmol/L   Glucose, Bld 93 70 - 99 mg/dL   BUN 17 8 - 23 mg/dL   Creatinine, Ser 0.84 0.61 - 1.24 mg/dL   Calcium 8.2 (L) 8.9 - 10.3 mg/dL   GFR, Estimated >60 >60 mL/min   Anion gap 5 5 - 15  Magnesium     Status: None   Collection Time: 03/17/21  5:50 AM  Result Value Ref Range   Magnesium 2.0 1.7 - 2.4 mg/dL    Recent Results (from the past 240 hour(s))  Urine culture     Status: Abnormal   Collection Time: 03/15/21 11:49 AM   Specimen: Urine, Random  Result Value Ref Range Status   Specimen Description   Final    URINE, RANDOM Performed at [facility], [address] [place], [place] [address]    Special Requests   Final    NONE Performed at [facility], [address] [place], [place] [address]    Culture MULTIPLE SPECIES PRESENT, SUGGEST RECOLLECTION (A)  Final   Report Status 03/16/2021 FINAL  Final  Resp Panel by RT-PCR (Flu A&B, Covid) Nasopharyngeal Swab     Status: None   Collection Time: 03/15/21  1:33 PM   Specimen: Nasopharyngeal Swab; Nasopharyngeal(NP) swabs in vial transport medium  Result Value Ref Range Status   SARS Coronavirus 2 by RT  PCR NEGATIVE NEGATIVE Final    Comment: (NOTE) SARS-CoV-2 target nucleic acids are NOT DETECTED.  The SARS-CoV-2 RNA is generally detectable in upper respiratory specimens during the acute phase of infection. The lowest concentration of SARS-CoV-2 viral copies this assay can detect is 138 copies/mL. A negative result does not preclude SARS-Cov-2 infection and should not be used as the sole basis for treatment or other patient management decisions. A negative result may occur with  improper specimen collection/handling, submission of specimen other than nasopharyngeal swab, presence of viral mutation(s) within the areas targeted by this assay, and inadequate number of viral copies(<138 copies/mL). A negative result must be combined with clinical observations, patient history, and epidemiological information. The expected result is Negative.  Fact Sheet for Patients:  EntrepreneurPulse.com.au  Fact Sheet for Healthcare Providers:  IncredibleEmployment.be  This test is no t yet approved or cleared by the [place] FDA and  has been authorized for detection and/or diagnosis of SARS-CoV-2 by FDA under an Emergency Use Authorization (EUA). This EUA will remain  in effect (meaning this test can be used) for the duration of the COVID-19 declaration under Section 564(b)(1) of the Act, 21 U.S.C.section 360bbb-3(b)(1),  unless the authorization is terminated  or revoked sooner.       Influenza A by PCR NEGATIVE NEGATIVE Final   Influenza B by PCR NEGATIVE NEGATIVE Final    Comment: (NOTE) The Xpert Xpress SARS-CoV-2/FLU/RSV plus assay is intended as an aid in the diagnosis of influenza from Nasopharyngeal swab specimens and should not be used as a sole basis for treatment. Nasal washings and aspirates are unacceptable for Xpert Xpress SARS-CoV-2/FLU/RSV testing.  Fact Sheet for Patients: EntrepreneurPulse.com.au  Fact Sheet for Healthcare Providers: IncredibleEmployment.be  This test is not yet approved or cleared by the [place] FDA and has been authorized for detection and/or diagnosis of SARS-CoV-2 by FDA under an Emergency Use Authorization (EUA). This EUA will remain in effect (meaning this test can be used) for the duration of the COVID-19 declaration under Section 564(b)(1) of the Act, 21 U.S.C. section 360bbb-3(b)(1), unless the authorization is terminated or revoked.  Performed at [facility], [address] [place], [place] [address]      Radiology Studies: CT Angio Chest PE W and/or Wo Contrast  Result Date: 03/15/2021 CLINICAL DATA:  85 year old with back pain.  Cough. EXAM: CT ANGIOGRAPHY CHEST WITH CONTRAST TECHNIQUE: Multidetector CT imaging of the chest was performed using the standard protocol during bolus administration of intravenous contrast. Multiplanar CT image reconstructions and MIPs were obtained to evaluate the vascular anatomy. CONTRAST:  114mL OMNIPAQUE IOHEXOL 350 MG/ML SOLN COMPARISON:  Radiograph earlier today FINDINGS: Cardiovascular: Examination is positive for acute pulmonary embolus. Pulmonary arterial filling defects are seen in the right distal main pulmonary artery, extending into the middle and lower lobar branches. Lower lobe thrombus is near occlusive. There is subsegmental thrombus in the right upper  lobe pulmonary artery. No definite left-sided filling defects. There is right heart strain with RV to LV ratio of 1.5 as well as mild straightening of the intraventricular septum. Small pericardial effusion. Coronary artery calcifications are seen. Right chest port is in place, tip at the atrial caval junction. There are multiple right chest wall collaterals. No evidence of SVC occlusion. Moderate aortic atherosclerosis without aneurysm or dissection. Mediastinum/Nodes: No enlarged mediastinal or hilar lymph nodes. Decompressed esophagus. Thyroid gland not visualized. Lungs/Pleura: Peripheral opacities in the medial and lateral right lower lobe slightly triangular may represent pulmonary  infarcts. There are patchy ground-glass nodular opacities in the posterior right upper lobe. Small right pleural effusion. Mild emphysema. No pulmonary mass. Upper Abdomen: Assessed on concurrent noncontrast abdominal CT, reported separately. There is minimal contrast refluxing into the hepatic veins and IVC. Musculoskeletal: Thoracic spondylosis with multilevel endplate spurring in the thoracic spine. No acute osseous abnormality or evidence of focal bone lesion. Review of the MIP images confirms the above findings. IMPRESSION: 1. Examination is positive for acute pulmonary embolus with moderate clot burden and evidence of right heart strain, RV to LV ratio of 1.5. 2. Peripheral opacities in the medial and lateral right lower lobe may represent pulmonary infarcts. 3. Patchy ground-glass nodular opacities in the posterior right upper lobe may be infectious or inflammatory, including COVID-19 pneumonia. 4. Small right pleural effusion. 5. Mild emphysema. Aortic Atherosclerosis (ICD10-I70.0) and Emphysema (ICD10-J43.9). Critical Value/emergent results were called by telephone at the time of interpretation on 03/15/2021 at 4:11 pm to provider [facility] , who verbally acknowledged these results. Electronically Signed   By: Keith Rake M.D.   On: 03/15/2021 16:11   US Venous Img Lower Bilateral (DVT)  Result Date: 03/15/2021 CLINICAL DATA:  Known pulmonary emboli EXAM: BILATERAL LOWER EXTREMITY VENOUS DOPPLER ULTRASOUND TECHNIQUE: Gray-scale sonography with graded compression, as well as color Doppler and duplex ultrasound were performed to evaluate the lower extremity deep venous systems from the level of the common femoral vein and including the common femoral, femoral, profunda femoral, popliteal and calf veins including the posterior tibial, peroneal and gastrocnemius veins when visible. The superficial great saphenous vein was also interrogated. Spectral Doppler was utilized to evaluate flow at rest and with distal augmentation maneuvers in the common femoral, femoral and popliteal veins. COMPARISON:  None. FINDINGS: RIGHT LOWER EXTREMITY Common Femoral Vein: No evidence of thrombus. Normal compressibility, respiratory phasicity and response to augmentation. Saphenofemoral Junction: No evidence of thrombus. Normal compressibility and flow on color Doppler imaging. Profunda Femoral Vein: No evidence of thrombus. Normal compressibility and flow on color Doppler imaging. Femoral Vein: Thrombus is noted with decreased compressibility. Popliteal Vein: Thrombus is noted with decreased compressibility. Calf Veins: No evidence of thrombus. Normal compressibility and flow on color Doppler imaging. Superficial Great Saphenous Vein: No evidence of thrombus. Normal compressibility. Venous Reflux:  None. Other Findings:  None. LEFT LOWER EXTREMITY Common Femoral Vein: No evidence of thrombus. Normal compressibility, respiratory phasicity and response to augmentation. Saphenofemoral Junction: No evidence of thrombus. Normal compressibility and flow on color Doppler imaging. Profunda Femoral Vein: No evidence of thrombus. Normal compressibility and flow on color Doppler imaging. Femoral Vein: No evidence of thrombus. Normal compressibility,  respiratory phasicity and response to augmentation. Popliteal Vein: No evidence of thrombus. Normal compressibility, respiratory phasicity and response to augmentation. Calf Veins: No evidence of thrombus. Normal compressibility and flow on color Doppler imaging. Superficial Great Saphenous Vein: No evidence of thrombus. Normal compressibility. Venous Reflux:  None. Other Findings:  None. IMPRESSION: Right popliteal and femoral vein deep venous thrombosis. Electronically Signed   By: Inez Catalina M.D.   On: 03/15/2021 19:58   ECHOCARDIOGRAM COMPLETE  Result Date: 03/16/2021    ECHOCARDIOGRAM REPORT   Patient Name:   DONAVEN CRISWELL Date of Exam: 03/16/2021 Medical Rec #:  [id number]      Height:       70.0 in Accession #:    [id number]     Weight:       208.0 lb Date of Birth:  [birth year]  BSA:          2.122 m Patient Age:    89 years       BP:           129/59 mmHg Patient Gender: M              HR:           72 bpm. Exam Location:  Inpatient Procedure: 2D Echo, Cardiac Doppler, Color Doppler and Intracardiac            Opacification Agent Indications:    I26.02 Pulmonary embolus  History:        Patient has prior history of Echocardiogram examinations, most                 recent 07/17/2019. COPD, Signs/Symptoms:Dyspnea; Risk                 Factors:Dyslipidemia and Sleep Apnea. Cancer. CKD.                 Hypothyroidism.  Sonographer:    Jonelle Sidle Dance Referring Phys: [place]  1. Left ventricular ejection fraction, by estimation, is 55 to 60%. The left ventricle has normal function. The left ventricle has no regional wall motion abnormalities. Left ventricular diastolic parameters are consistent with Grade I diastolic dysfunction (impaired relaxation).  2. Right ventricular systolic function is normal. The right ventricular size is normal.  3. The mitral valve is grossly normal. No evidence of mitral valve regurgitation.  4. The aortic valve is grossly normal. Aortic valve  regurgitation is not visualized. FINDINGS  Left Ventricle: Left ventricular ejection fraction, by estimation, is 55 to 60%. The left ventricle has normal function. The left ventricle has no regional wall motion abnormalities. Definity contrast agent was given IV to delineate the left ventricular  endocardial borders. The left ventricular internal cavity size was normal in size. There is borderline left ventricular hypertrophy. Left ventricular diastolic parameters are consistent with Grade I diastolic dysfunction (impaired relaxation). Right Ventricle: The right ventricular size is normal. No increase in right ventricular wall thickness. Right ventricular systolic function is normal. Left Atrium: Left atrial size was normal in size. Right Atrium: Right atrial size was normal in size. Pericardium: There is no evidence of pericardial effusion. Mitral Valve: The mitral valve is grossly normal. No evidence of mitral valve regurgitation. Tricuspid Valve: The tricuspid valve is grossly normal. Tricuspid valve regurgitation is not demonstrated. Aortic Valve: The aortic valve is grossly normal. Aortic valve regurgitation is not visualized. Pulmonic Valve: The pulmonic valve was normal in structure. Pulmonic valve regurgitation is not visualized. Aorta: The aortic root and ascending aorta are structurally normal, with no evidence of dilitation. IAS/Shunts: The atrial septum is grossly normal.  LEFT VENTRICLE PLAX 2D LVIDd:         4.20 cm  Diastology LVIDs:         3.10 cm  LV e' medial:    5.55 cm/s LV PW:         1.10 cm  LV E/e' medial:  16.2 LV IVS:        1.20 cm  LV e' lateral:   6.85 cm/s LVOT diam:     2.30 cm  LV E/e' lateral: 13.1 LV SV:         104 LV SV Index:   49 LVOT Area:     4.15 cm  RIGHT VENTRICLE  IVC RV Basal diam:  2.80 cm     IVC diam: 1.50 cm RV S prime:     10.70 cm/s TAPSE (M-mode): 2.3 cm LEFT ATRIUM             Index       RIGHT ATRIUM           Index LA diam:        4.80 cm 2.26 cm/m   RA Area:     15.00 cm LA Vol (A2C):   64.0 ml 30.15 ml/m RA Volume:   39.30 ml  18.52 ml/m LA Vol (A4C):   68.8 ml 32.42 ml/m LA Biplane Vol: 66.8 ml 31.47 ml/m  AORTIC VALVE LVOT Vmax:   97.00 cm/s LVOT Vmean:  76.000 cm/s LVOT VTI:    0.251 m  AORTA Ao Root diam: 3.70 cm Ao Asc diam:  2.90 cm MITRAL VALVE MV Area (PHT): 2.29 cm     SHUNTS MV Decel Time: 331 msec     Systemic VTI:  0.25 m MV E velocity: 90.00 cm/s   Systemic Diam: 2.30 cm MV A velocity: 127.00 cm/s MV E/A ratio:  0.71 Mertie Moores MD Electronically signed by Mertie Moores MD Signature Date/Time: 03/16/2021/4:49:09 PM    Final    CT Renal Stone Study  Result Date: 03/15/2021 CLINICAL DATA:  Hematuria. EXAM: CT ABDOMEN AND PELVIS WITHOUT CONTRAST TECHNIQUE: Multidetector CT imaging of the abdomen and pelvis was performed following the standard protocol without IV contrast. COMPARISON:  Renal ultrasound 08/05/2020 FINDINGS: Lower chest: Assessed on concurrent chest CT, reported separately. Hepatobiliary: No focal hepatic abnormality on noncontrast exam. Decompressed gallbladder. No calcified gallstone or pericholecystic inflammation. Pancreas: No ductal dilatation or inflammation. Spleen: Mild splenomegaly, spleen measuring 12.4 x 13.1 x 6.5 cm (volume = 550 cm^3). No focal abnormality. Adrenals/Urinary Tract: Normal adrenal glands. No hydronephrosis or renal calculi. Calcifications at the renal hila are felt to be vascular. Minimal symmetric perinephric edema typically chronic. No evidence of focal renal lesion on this noncontrast exam. Partially distended urinary bladder. No bladder wall thickening or stone. Stomach/Bowel: Decompressed stomach. No small bowel obstruction or inflammation. Scattered colonic diverticulosis without diverticulitis. Appendectomy. Vascular/Lymphatic: Moderate aortic atherosclerosis. Bi-iliac stents in place. No bulky abdominopelvic adenopathy. Reproductive: Prostate is unremarkable. Other: No ascites or free  air. Fat containing inguinal hernias, left greater than right. Musculoskeletal: No acute osseous abnormality or focal bone lesion. IMPRESSION: 1. No renal stones or obstructive uropathy. Calcifications at the renal hila are felt to be vascular. 2. Colonic diverticulosis without diverticulitis. 3. Mild splenomegaly. Aortic Atherosclerosis (ICD10-I70.0). Electronically Signed   By: Keith Rake M.D.   On: 03/15/2021 16:20   US Venous Img Lower Bilateral (DVT)  Final Result    CT Angio Chest PE W and/or Wo Contrast  Final Result    CT Renal Stone Study  Final Result    DG Chest 2 View  Final Result    IR IVC FILTER PLMT / S&I /IMG GUID/MOD SED    (Results Pending)    Scheduled Meds: . Chlorhexidine Gluconate Cloth  6 each Topical Daily  . fentaNYL      . levothyroxine  175 mcg Oral Q0600  . lidocaine      . loratadine  10 mg Oral Daily  . propranolol ER  80 mg Oral Daily  . simvastatin  40 mg Oral q1800   PRN Meds: [DISCONTINUED] acetaminophen **OR** acetaminophen, acetaminophen, fentaNYL, lidocaine (PF), melatonin, ondansetron **OR** ondansetron (ZOFRAN) IV, sodium chloride flush  Continuous Infusions: . heparin 1,350 Units/hr (03/17/21 0449)     LOS: 2 days  Time spent: Greater than 50% of the 35 minute visit was spent in counseling/coordination of care for the patient as laid out in the A&P.   Dwyane Dee, MD [facility] 03/17/2021, 2:58 PM

## 2021-03-17 NOTE — Procedures (Signed)
Interventional Radiology Procedure Note  Procedure: IVC filter placement  Complications: None  Estimated Blood Loss: None  Findings: Patent IVC. Bard Denali infrarenal IVC filter placed.  Venetia Night. Kathlene Cote, M.D Pager:  [phone]

## 2021-03-17 NOTE — Progress Notes (Signed)
Victor [name] is doing okay.  He feels okay.  He is on heparin.  He is doing okay on the heparin infusion.  There is no bleeding.  I spoke to his leukemia specialist at [facility].  Dr. Linus Orn would like to have an IVC filter placed.  He does not think that [name] is a great candidate for long-term anticoagulation because of his thrombocytopenia.  I do think that he needs some anticoagulation because of the pulmonary embolism.  He had an echocardiogram that thankfully did not show any right heart strain.  I do think that once the filter gets placed, we can get him on Eliquis or Xarelto.  His labs today show white cell count 1.7.  Hemoglobin 9.6.  Platelet count 24,000.  The BUN is 17 creatinine 0.84.  He has had a decent appetite.  He has had a little bit of constipation.  There is no nausea or vomiting.  I do think that he can come off the cardiac monitor now.  He really would like to go home.  I would like to hope that he would be able to go home after the filter is placed and we can make sure that he gets his oral anticoagulation.  I would NOT do any kind of bolus dosing or loading dosing for oral anticoagulation.  Lattie Haw, MD  Daisy Lazar 3:17

## 2021-03-18 ENCOUNTER — Other Ambulatory Visit (HOSPITAL_COMMUNITY): Payer: Self-pay

## 2021-03-18 DIAGNOSIS — I2699 Other pulmonary embolism without acute cor pulmonale: Principal | ICD-10-CM

## 2021-03-18 LAB — BASIC METABOLIC PANEL
Anion gap: 4 — ABNORMAL LOW (ref 5–15)
BUN: 14 mg/dL (ref 8–23)
CO2: 28 mmol/L (ref 22–32)
Calcium: 8.2 mg/dL — ABNORMAL LOW (ref 8.9–10.3)
Chloride: 108 mmol/L (ref 98–111)
Creatinine, Ser: 0.94 mg/dL (ref 0.61–1.24)
GFR, Estimated: >60 mL/min (ref >60)
Glucose, Bld: 99 mg/dL (ref 70–99)
Potassium: 3.7 mmol/L (ref 3.5–5.1)
Sodium: 140 mmol/L (ref 135–145)

## 2021-03-18 LAB — CBC
HCT: 29.9 % — ABNORMAL LOW (ref 39.0–52.0)
Hemoglobin: 9.6 g/dL — ABNORMAL LOW (ref 13.0–17.0)
MCH: 33 pg (ref 26.0–34.0)
MCHC: 32.1 g/dL (ref 30.0–36.0)
MCV: 102.7 fL — ABNORMAL HIGH (ref 80.0–100.0)
Platelets: 21 10*3/uL — CL (ref 150–400)
RBC: 2.91 MIL/uL — ABNORMAL LOW (ref 4.22–5.81)
RDW: 15.6 % — ABNORMAL HIGH (ref 11.5–15.5)
WBC: 1.2 10*3/uL — CL (ref 4.0–10.5)
nRBC: 0 % (ref 0.0–0.2)

## 2021-03-18 LAB — HEPARIN LEVEL (UNFRACTIONATED): Heparin Unfractionated: >1.1 IU/mL — ABNORMAL HIGH (ref 0.30–0.70)

## 2021-03-18 LAB — MAGNESIUM: Magnesium: 2 mg/dL (ref 1.7–2.4)

## 2021-03-18 MED ORDER — APIXABAN 5 MG PO TABS: 5.0000 mg | ORAL_TABLET | Freq: Two times a day (BID) | ORAL | 3 refills | Status: DC

## 2021-03-18 MED ORDER — HEPARIN SOD (PORK) LOCK FLUSH 100 UNIT/ML IV SOLN
500.0000 [IU] | INTRAVENOUS | Status: AC | PRN
Start: 2021-03-18 — End: 2021-03-18
  Administered 2021-03-18: 500 [IU]
  Filled 2021-03-18: qty 5

## 2021-03-18 MED ORDER — ACETAMINOPHEN 325 MG PO TABS
650.0000 mg | ORAL_TABLET | Freq: Once | ORAL | Status: AC
  Administered 2021-03-18: 650 mg via ORAL
  Filled 2021-03-18: qty 2

## 2021-03-18 MED ORDER — APIXABAN 5 MG PO TABS
5.0000 mg | ORAL_TABLET | Freq: Two times a day (BID) | ORAL | 3 refills | Status: DC
  Filled 2021-03-18 (×3): qty 60, 30d supply, fill #0

## 2021-03-18 MED ORDER — SODIUM CHLORIDE 0.9% IV SOLUTION: Freq: Once | INTRAVENOUS | Status: AC

## 2021-03-18 MED ORDER — DIPHENHYDRAMINE HCL 25 MG PO CAPS
25.0000 mg | ORAL_CAPSULE | Freq: Once | ORAL | Status: AC
Start: 2021-03-18 — End: 2021-03-18
  Administered 2021-03-18: 25 mg via ORAL
  Filled 2021-03-18: qty 1

## 2021-03-18 NOTE — Progress Notes (Signed)
He now has the IVC filter placed.  He is off heparin.  He should be getting the Eliquis.  There was no bolus dose or loading dose of the Eliquis.  His platelet count is 21,000.  I think that we should give him a unit of platelets.  I think this would be very reasonable.  His white cell count is 1.2.  I am not too worried about the white cell count being on the low side.  His hemoglobin is 9.6.  The BUN is 14 creatinine 0.94.  I do not see any reason why he cannot go home today after the platelets.  Again we had to make sure that the Eliquis is going be covered.  He is not have any cough.  Is no pain.  There is no bleeding.  He had a little bit of oozing from the IV site in his right antecubital space.  I was vital signs are stable.  There is no change in his overall physical exam.  His lungs are clear bilaterally.  Cardiac exam regular rate and rhythm.  Abdomen was soft.  Extremity shows some trace edema in his legs bilaterally.  I cannot palpate a venous cord.  Neurological exam is nonfocal.  Hopefully, Mr. Meggett will be able to go home today.  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  He has had a pulmonary emboli and right lower extremity DVT.  I suspect his blood is hypercoagulable because of his underlying leukemia.  He had a IVC filter placed.  He is on Eliquis because of the pulmonary emboli.  He will get platelets today and then hopefully go home.  He is post to see me in the office on Monday.  I do appreciate the outstanding care that he received from all the staff on [address] E.  Lattie Haw, MD  Romans 5:3-3

## 2021-03-18 NOTE — Progress Notes (Signed)
Critical Labs WBC 1.2 Platelets 21  Provider has been made aware.

## 2021-03-18 NOTE — Progress Notes (Signed)
Reviewed written d/c instructions w pt and his wife and all questions answered. They both verbalized understanding. 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 D/c per w/c w all belongings in stable condition.

## 2021-03-18 NOTE — Discharge Summary (Signed)
Physician Discharge Summary   CHARLEY MISKE HKV:[id number] DOB: July 08, 1936 DOA: 03/15/2021  PCP: Ronnald Nian, DO  Admit date: 03/15/2021 Discharge date:  03/18/2021  Admitted From: Home Disposition:  Home Discharging physician: Dwyane Dee, MD  Recommendations for Outpatient Follow-up:  1. Follow up with oncology 2. Repeat CBC at follow up    Patient discharged to Home in Discharge Condition: stable Risk of unplanned readmission score: Unplanned Admission- Pilot do not use: 13.7  CODE STATUS: Full Diet recommendation:  Diet Orders (From admission, onward)    Start     Ordered   03/18/21 0000  Diet - low sodium heart healthy        03/18/21 1244   03/16/21 0210  Diet Heart Room service appropriate? Yes; Fluid consistency: Thin  Diet effective now       Question Answer Comment  Room service appropriate? Yes   Fluid consistency: Thin      03/16/21 0210          Hospital Course:  Mr. Thibeau is AN 85 year old male with PMH AML (follows with Dr. [name]), COPD, vocal cord and thyroid cancer in remission, thrombocytopenia.  He presented to the hospital with complaints of right lower back pain that has been ongoing for approximately 2 days prior to admission.  He also had endorsed slight worsening shortness of breath with minimal exertion. After further discussion with patient and his wife, he did endorse that he has not been as active as he typically is for the past several weeks to months but he denied being very sedentary either or just laying around.  He underwent work up on admission with CT renal stone study, CTA chest, and CXR.  There was mild bilateral perinephric edema noted on initial CT.  He also underwent CTA chest and was found to have a right-sided PE.  This was followed by lower extremity duplex which also showed right popliteal and femoral DVTs.  He was started on a heparin drip.  Oncology was also consulted and followed patient while hospitalized. His case  was also discussed with his leukemia specialist at [facility], Dr. Linus Orn; patient was recommended to undergo IVC filter placement to try and avoid long-term anticoagulation given poor candidacy due to underlying thrombocytopenia. Patient underwent successful IVC filter placement on 03/17/2021. He was transitioned from heparin drip to Eliquis the evening of 03/17/2021 and discharged home to continue on Eliquis (no 7-day loading dose).  Acute PE Acute RLE DVT - right sided PE and right popliteal/femoral DVT - Etiology likely in setting of underlying AML and possibly from some decrease in mobility recently - Oncology also following; IVC filter recommended per Dr. Linus Orn at [facility] (concern was for patient being on long term AC 2/2 thrombocytopenia); IVC filter placed 03/17/21 - heparin transitioned to Eliquis on 03/17/21 - echo negative for right heart strain; LV function preserved   Right lower back pain - unclear etiology; possibly strain vs perinephric edema seen on CT vs infection - UA essentially neg; UCx unremarkable; his CVA tenderness was B/L on exam initially but more pronounced on the right; the tenderness had resolved to palpation by 03/18/21  AML - home Venclexta on hold while PLTC<25 per hospital/pharmacy policy; Dr. [name] aware -Continue following with oncology outpatient   Hypothyroidism - Continue Synthroid  Hypertension - Blood pressure remaining stable, will resume propranolol; doxazosin resumed at discharge too  The patient's chronic medical conditions were treated accordingly per the patient's home medication regimen except as noted.  On day of  discharge, patient was felt deemed stable for discharge. Patient/family member advised to call PCP or come back to ER if needed.   Principal Diagnosis: Pulmonary embolism [facility])  Discharge Diagnoses: Active Hospital Problems   Diagnosis Date Noted  . Pulmonary embolism ([place]) 03/15/2021  . Right leg DVT ([place]) 03/16/2021  . Acute  leukemia ([place]) 08/05/2020  . Thrombocytopenia ([place]) 08/05/2020    Resolved Hospital Problems  No resolved problems to display.    Discharge Instructions    Diet - low sodium heart healthy   Complete by: As directed    Increase activity slowly   Complete by: As directed      Allergies as of 03/18/2021   No Known Allergies     Medication List    STOP taking these medications   ibuprofen 200 MG tablet Commonly known as: ADVIL     TAKE these medications   acetaminophen 500 MG tablet Commonly known as: TYLENOL Take 1,000 mg by mouth every 6 (six) hours as needed for moderate pain.   acyclovir 400 MG tablet Commonly known as: ZOVIRAX Take 1 tablet (400 mg total) by mouth 2 (two) times daily as needed.   apixaban 5 MG Tabs tablet Commonly known as: ELIQUIS Take 1 tablet (5 mg total) by mouth 2 (two) times daily.   CLARITIN PO Take 10 mg by mouth at bedtime. Allertec   doxazosin 2 MG tablet Commonly known as: CARDURA Take 1 tablet (2 mg total) by mouth at bedtime.   fluticasone 50 MCG/ACT nasal spray Commonly known as: FLONASE Place 2 sprays into the nose 2 (two) times daily as needed for allergies.   hydrALAZINE 25 MG tablet Commonly known as: APRESOLINE Take 1 tablet (25 mg total) by mouth 3 (three) times daily as needed. Take if standing BP >140/80 mm Hg   levothyroxine 175 MCG tablet Commonly known as: SYNTHROID Take 175 mcg by mouth daily.   lidocaine-prilocaine cream Commonly known as: EMLA Apply 1 application topically as needed for up to 30 doses.   Melatonin 5 MG Chew Chew 1 tablet by mouth daily as needed (sleep).   potassium chloride 10 MEQ CR capsule Commonly known as: MICRO-K Take 10 mEq by mouth in the morning and at bedtime.   propranolol ER 80 MG 24 hr capsule Commonly known as: INDERAL LA Take 1 capsule (80 mg total) by mouth daily.   simvastatin 40 MG tablet Commonly known as: ZOCOR TAKE ONE TABLET BY MOUTH DAILY AT BEDTIME What  changed: when to take this   venetoclax 100 MG tablet Commonly known as: VENCLEXTA Take 100 mg by mouth in the morning, at noon, in the evening, and at bedtime.       No Known Allergies  Consultations: Oncology IR  Discharge Exam: BP (!) 133/59   Pulse 78   Temp 98.3 F (36.8 C) (Oral)   Resp 17   Ht 5\' 10"  (1.778 m)   Wt 94.3 kg   SpO2 99%   BMI 29.84 kg/m  General appearance: alert, cooperative and no distress Head: Normocephalic, without obvious abnormality, atraumatic Eyes: EOMI Lungs: clear to auscultation bilaterally Heart: regular rate and rhythm and S1, S2 normal Abdomen: normal findings: bowel sounds normal and soft, non-tender  Back: no further CVA tenderness on palpation Extremities: RLE edema appreciated (approx trace to 1+) Skin: mobility and turgor normal Neurologic: Grossly normal  The results of significant diagnostics from this hospitalization (including imaging, microbiology, ancillary and laboratory) are listed below for reference.   Microbiology:  Recent Results (from the past 240 hour(s))  Urine culture     Status: Abnormal   Collection Time: 03/15/21 11:49 AM   Specimen: Urine, Random  Result Value Ref Range Status   Specimen Description   Final    URINE, RANDOM Performed at [facility], [address] [place], [place] [address]    Special Requests   Final    NONE Performed at [facility], [address] [place], [place] [address]    Culture MULTIPLE SPECIES PRESENT, SUGGEST RECOLLECTION (A)  Final   Report Status 03/16/2021 FINAL  Final  Resp Panel by RT-PCR (Flu A&B, Covid) Nasopharyngeal Swab     Status: None   Collection Time: 03/15/21  1:33 PM   Specimen: Nasopharyngeal Swab; Nasopharyngeal(NP) swabs in vial transport medium  Result Value Ref Range Status   SARS Coronavirus 2 by RT PCR NEGATIVE NEGATIVE Final    Comment: (NOTE) SARS-CoV-2 target nucleic acids are NOT DETECTED.  The SARS-CoV-2 RNA is  generally detectable in upper respiratory specimens during the acute phase of infection. The lowest concentration of SARS-CoV-2 viral copies this assay can detect is 138 copies/mL. A negative result does not preclude SARS-Cov-2 infection and should not be used as the sole basis for treatment or other patient management decisions. A negative result may occur with  improper specimen collection/handling, submission of specimen other than nasopharyngeal swab, presence of viral mutation(s) within the areas targeted by this assay, and inadequate number of viral copies(<138 copies/mL). A negative result must be combined with clinical observations, patient history, and epidemiological information. The expected result is Negative.  Fact Sheet for Patients:  EntrepreneurPulse.com.au  Fact Sheet for Healthcare Providers:  IncredibleEmployment.be  This test is no t yet approved or cleared by the [place] FDA and  has been authorized for detection and/or diagnosis of SARS-CoV-2 by FDA under an Emergency Use Authorization (EUA). This EUA will remain  in effect (meaning this test can be used) for the duration of the COVID-19 declaration under Section 564(b)(1) of the Act, 21 U.S.C.section 360bbb-3(b)(1), unless the authorization is terminated  or revoked sooner.       Influenza A by PCR NEGATIVE NEGATIVE Final   Influenza B by PCR NEGATIVE NEGATIVE Final    Comment: (NOTE) The Xpert Xpress SARS-CoV-2/FLU/RSV plus assay is intended as an aid in the diagnosis of influenza from Nasopharyngeal swab specimens and should not be used as a sole basis for treatment. Nasal washings and aspirates are unacceptable for Xpert Xpress SARS-CoV-2/FLU/RSV testing.  Fact Sheet for Patients: EntrepreneurPulse.com.au  Fact Sheet for Healthcare Providers: IncredibleEmployment.be  This test is not yet approved or cleared by the [place] FDA and has been authorized for detection and/or diagnosis of SARS-CoV-2 by FDA under an Emergency Use Authorization (EUA). This EUA will remain in effect (meaning this test can be used) for the duration of the COVID-19 declaration under Section 564(b)(1) of the Act, 21 U.S.C. section 360bbb-3(b)(1), unless the authorization is terminated or revoked.  Performed at [facility], [address] [place], [place] [address]      Labs: BNP (last 3 results) Recent Labs    08/04/20 1523  BNP 24.2   Basic Metabolic Panel: Recent Labs  Lab 03/13/21 1132 03/15/21 1149 03/16/21 0812 03/17/21 0550 03/18/21 0349  NA 137 138 140 141 140  K 3.6 3.8 3.8 3.8 3.7  CL 104 103 106 107 108  CO2 28 28 29 29  28  GLUCOSE 113* 97 95 93 99  BUN 23 17 14 17 14   CREATININE 0.98 1.01 0.89 0.84 0.94  CALCIUM 8.4* 8.2* 8.6* 8.2* 8.2*  MG  --   --   --  2.0 2.0   Liver Function Tests: Recent Labs  Lab 03/13/21 1132 03/15/21 1149  AST 9* 13*  ALT 11 15  ALKPHOS 46 47  BILITOT 0.7 0.8  PROT 4.6* 5.6*  ALBUMIN 3.4* 3.2*   No results for input(s): LIPASE, AMYLASE in the last 168 hours. No results for input(s): AMMONIA in the last 168 hours. CBC: Recent Labs  Lab 03/13/21 1132 03/15/21 1149 03/16/21 0033 03/17/21 0550 03/18/21 0349  WBC 4.6 4.1 3.0* 1.7* 1.2*  NEUTROABS 3.5 3.1  --   --   --   HGB 11.3* 11.7* 10.6* 9.6* 9.6*  HCT 33.9* 35.7* 32.9* 29.8* 29.9*  MCV 98.8 101.7* 102.8* 101.0* 102.7*  PLT 35* 26* 21* 24* 21*   Cardiac Enzymes: No results for input(s): CKTOTAL, CKMB, CKMBINDEX, TROPONINI in the last 168 hours. BNP: Invalid input(s): POCBNP CBG: No results for input(s): GLUCAP in the last 168 hours. D-Dimer Recent Labs    03/15/21 1422  DDIMER 2.06*   Hgb A1c No results for input(s): HGBA1C in the last 72 hours. Lipid Profile No results for input(s): CHOL, HDL, LDLCALC, TRIG, CHOLHDL, LDLDIRECT in the last 72 hours. Thyroid function  studies No results for input(s): TSH, T4TOTAL, T3FREE, THYROIDAB in the last 72 hours.  Invalid input(s): FREET3 Anemia work up No results for input(s): VITAMINB12, FOLATE, FERRITIN, TIBC, IRON, RETICCTPCT in the last 72 hours. Urinalysis    Component Value Date/Time   COLORURINE YELLOW 03/15/2021 1149   APPEARANCEUR CLEAR 03/15/2021 1149   LABSPEC 1.010 03/15/2021 1149   PHURINE 6.0 03/15/2021 1149   GLUCOSEU NEGATIVE 03/15/2021 1149   HGBUR LARGE (A) 03/15/2021 [place] 03/15/2021 [place] 03/15/2021 [place] 03/15/2021 1149   NITRITE NEGATIVE 03/15/2021 [place] 03/15/2021 1149   Sepsis Labs Invalid input(s): PROCALCITONIN,  WBC,  LACTICIDVEN Microbiology Recent Results (from the past 240 hour(s))  Urine culture     Status: Abnormal   Collection Time: 03/15/21 11:49 AM   Specimen: Urine, Random  Result Value Ref Range Status   Specimen Description   Final    URINE, RANDOM Performed at [facility], [address] [place], [place] [address]    Special Requests   Final    NONE Performed at [facility], [address] [place], [place] [address]    Culture MULTIPLE SPECIES PRESENT, SUGGEST RECOLLECTION (A)  Final   Report Status 03/16/2021 FINAL  Final  Resp Panel by RT-PCR (Flu A&B, Covid) Nasopharyngeal Swab     Status: None   Collection Time: 03/15/21  1:33 PM   Specimen: Nasopharyngeal Swab; Nasopharyngeal(NP) swabs in vial transport medium  Result Value Ref Range Status   SARS Coronavirus 2 by RT PCR NEGATIVE NEGATIVE Final    Comment: (NOTE) SARS-CoV-2 target nucleic acids are NOT DETECTED.  The SARS-CoV-2 RNA is generally detectable in upper respiratory specimens during the acute phase of infection. The lowest concentration of SARS-CoV-2 viral copies this assay can detect is 138 copies/mL. A negative result does not preclude SARS-Cov-2 infection and should not be  used as the sole basis for treatment or other patient management decisions. A negative result may occur with  improper specimen collection/handling, submission of  specimen other than nasopharyngeal swab, presence of viral mutation(s) within the areas targeted by this assay, and inadequate number of viral copies(<138 copies/mL). A negative result must be combined with clinical observations, patient history, and epidemiological information. The expected result is Negative.  Fact Sheet for Patients:  EntrepreneurPulse.com.au  Fact Sheet for Healthcare Providers:  IncredibleEmployment.be  This test is no t yet approved or cleared by the [place] FDA and  has been authorized for detection and/or diagnosis of SARS-CoV-2 by FDA under an Emergency Use Authorization (EUA). This EUA will remain  in effect (meaning this test can be used) for the duration of the COVID-19 declaration under Section 564(b)(1) of the Act, 21 U.S.C.section 360bbb-3(b)(1), unless the authorization is terminated  or revoked sooner.       Influenza A by PCR NEGATIVE NEGATIVE Final   Influenza B by PCR NEGATIVE NEGATIVE Final    Comment: (NOTE) The Xpert Xpress SARS-CoV-2/FLU/RSV plus assay is intended as an aid in the diagnosis of influenza from Nasopharyngeal swab specimens and should not be used as a sole basis for treatment. Nasal washings and aspirates are unacceptable for Xpert Xpress SARS-CoV-2/FLU/RSV testing.  Fact Sheet for Patients: EntrepreneurPulse.com.au  Fact Sheet for Healthcare Providers: IncredibleEmployment.be  This test is not yet approved or cleared by the [place] FDA and has been authorized for detection and/or diagnosis of SARS-CoV-2 by FDA under an Emergency Use Authorization (EUA). This EUA will remain in effect (meaning this test can be used) for the duration of the COVID-19 declaration under Section  564(b)(1) of the Act, 21 U.S.C. section 360bbb-3(b)(1), unless the authorization is terminated or revoked.  Performed at [facility], [address] [place], [place] [address]     Procedures/Studies: DG Chest 2 View  Result Date: 03/15/2021 CLINICAL DATA:  Shortness of breath EXAM: CHEST - 2 VIEW COMPARISON:  08/04/2020 FINDINGS: Dual lumen right chest port terminates in the region of the superior vena cava. The heart size is within normal limits. No pulmonary vascular congestion. The lungs are hyperexpanded. Linear opacities at the lung bases likely due to atelectasis or scarring. IMPRESSION: No acute cardiopulmonary process. Electronically Signed   By: Miachel Roux M.D.   On: 03/15/2021 12:33   CT Angio Chest PE W and/or Wo Contrast  Result Date: 03/15/2021 CLINICAL DATA:  85 year old with back pain.  Cough. EXAM: CT ANGIOGRAPHY CHEST WITH CONTRAST TECHNIQUE: Multidetector CT imaging of the chest was performed using the standard protocol during bolus administration of intravenous contrast. Multiplanar CT image reconstructions and MIPs were obtained to evaluate the vascular anatomy. CONTRAST:  141mL OMNIPAQUE IOHEXOL 350 MG/ML SOLN COMPARISON:  Radiograph earlier today FINDINGS: Cardiovascular: Examination is positive for acute pulmonary embolus. Pulmonary arterial filling defects are seen in the right distal main pulmonary artery, extending into the middle and lower lobar branches. Lower lobe thrombus is near occlusive. There is subsegmental thrombus in the right upper lobe pulmonary artery. No definite left-sided filling defects. There is right heart strain with RV to LV ratio of 1.5 as well as mild straightening of the intraventricular septum. Small pericardial effusion. Coronary artery calcifications are seen. Right chest port is in place, tip at the atrial caval junction. There are multiple right chest wall collaterals. No evidence of SVC occlusion. Moderate aortic atherosclerosis  without aneurysm or dissection. Mediastinum/Nodes: No enlarged mediastinal or hilar lymph nodes. Decompressed esophagus. Thyroid gland not visualized. Lungs/Pleura: Peripheral opacities in the medial and lateral right lower lobe slightly triangular may represent pulmonary  infarcts. There are patchy ground-glass nodular opacities in the posterior right upper lobe. Small right pleural effusion. Mild emphysema. No pulmonary mass. Upper Abdomen: Assessed on concurrent noncontrast abdominal CT, reported separately. There is minimal contrast refluxing into the hepatic veins and IVC. Musculoskeletal: Thoracic spondylosis with multilevel endplate spurring in the thoracic spine. No acute osseous abnormality or evidence of focal bone lesion. Review of the MIP images confirms the above findings. IMPRESSION: 1. Examination is positive for acute pulmonary embolus with moderate clot burden and evidence of right heart strain, RV to LV ratio of 1.5. 2. Peripheral opacities in the medial and lateral right lower lobe may represent pulmonary infarcts. 3. Patchy ground-glass nodular opacities in the posterior right upper lobe may be infectious or inflammatory, including COVID-19 pneumonia. 4. Small right pleural effusion. 5. Mild emphysema. Aortic Atherosclerosis (ICD10-I70.0) and Emphysema (ICD10-J43.9). Critical Value/emergent results were called by telephone at the time of interpretation on 03/15/2021 at 4:11 pm to provider [facility] , who verbally acknowledged these results. Electronically Signed   By: Keith Rake M.D.   On: 03/15/2021 16:11   IR IVC FILTER PLMT / S&I Burke Keels GUID/MOD SED  Result Date: 03/17/2021 CLINICAL DATA:  Right popliteal vein DVT, acute pulmonary embolism and poor candidate for anticoagulation due to acute myeloid leukemia with significant thrombocytopenia. EXAM: 1. ULTRASOUND GUIDANCE FOR VASCULAR ACCESS OF THE RIGHT COMMON FEMORAL VEIN. 2. IVC VENOGRAM. 3. PERCUTANEOUS IVC FILTER PLACEMENT.  ANESTHESIA/SEDATION: The patient was not given moderate conscious sedation due to ingestion of food just prior to the procedure. He did receive 100 mcg of IV fentanyl during the procedure. CONTRAST:  CO2 FLUOROSCOPY TIME:  1 minute and 12 seconds.  135.0 mGy. PROCEDURE: The procedure, risks, benefits, and alternatives were explained to the patient. Questions regarding the procedure were encouraged and answered. The patient understands and consents to the procedure. A time-out was performed prior to initiating the procedure. The right groin was prepped with chlorhexidine in a sterile fashion, and a sterile drape was applied covering the operative field. A sterile gown and sterile gloves were used for the procedure. Local anesthesia was provided with 1% Lidocaine. Ultrasound was utilized to confirm patency of the right common femoral vein. Under direct ultrasound guidance, a 21 gauge needle was advanced into the right common femoral vein with ultrasound image documentation performed. After securing access with a micropuncture dilator, a guidewire was advanced into the inferior vena cava. A deployment sheath was advanced over the guidewire. This was utilized to perform IVC venography. The deployment sheath was further positioned in an appropriate location for filter deployment. A Bard Denali IVC filter was then advanced in the sheath. This was then fully deployed in the infrarenal IVC. Final filter position was confirmed with a fluoroscopic spot image. After the procedure the sheath was removed and hemostasis obtained with manual compression. COMPLICATIONS: None. FINDINGS: IVC venography demonstrates a normal caliber IVC with no evidence of thrombus. The IVC filter was successfully positioned below the level of the renal veins and is appropriately oriented. This IVC filter has both permanent and retrievable indications. IMPRESSION: Placement of percutaneous IVC filter in infrarenal IVC. IVC venogram shows no evidence  of IVC thrombus and normal caliber of the inferior vena cava. This filter does have both permanent and retrievable indications. PLAN: This IVC filter is potentially retrievable. The patient will be assessed for filter retrieval by Interventional Radiology in approximately 8-12 weeks. Further recommendations regarding filter retrieval, continued surveillance or declaration of device permanence, will be made  at that time. Electronically Signed   By: Aletta Edouard M.D.   On: 03/17/2021 17:07   US Venous Img Lower Bilateral (DVT)  Result Date: 03/15/2021 CLINICAL DATA:  Known pulmonary emboli EXAM: BILATERAL LOWER EXTREMITY VENOUS DOPPLER ULTRASOUND TECHNIQUE: Gray-scale sonography with graded compression, as well as color Doppler and duplex ultrasound were performed to evaluate the lower extremity deep venous systems from the level of the common femoral vein and including the common femoral, femoral, profunda femoral, popliteal and calf veins including the posterior tibial, peroneal and gastrocnemius veins when visible. The superficial great saphenous vein was also interrogated. Spectral Doppler was utilized to evaluate flow at rest and with distal augmentation maneuvers in the common femoral, femoral and popliteal veins. COMPARISON:  None. FINDINGS: RIGHT LOWER EXTREMITY Common Femoral Vein: No evidence of thrombus. Normal compressibility, respiratory phasicity and response to augmentation. Saphenofemoral Junction: No evidence of thrombus. Normal compressibility and flow on color Doppler imaging. Profunda Femoral Vein: No evidence of thrombus. Normal compressibility and flow on color Doppler imaging. Femoral Vein: Thrombus is noted with decreased compressibility. Popliteal Vein: Thrombus is noted with decreased compressibility. Calf Veins: No evidence of thrombus. Normal compressibility and flow on color Doppler imaging. Superficial Great Saphenous Vein: No evidence of thrombus. Normal compressibility. Venous  Reflux:  None. Other Findings:  None. LEFT LOWER EXTREMITY Common Femoral Vein: No evidence of thrombus. Normal compressibility, respiratory phasicity and response to augmentation. Saphenofemoral Junction: No evidence of thrombus. Normal compressibility and flow on color Doppler imaging. Profunda Femoral Vein: No evidence of thrombus. Normal compressibility and flow on color Doppler imaging. Femoral Vein: No evidence of thrombus. Normal compressibility, respiratory phasicity and response to augmentation. Popliteal Vein: No evidence of thrombus. Normal compressibility, respiratory phasicity and response to augmentation. Calf Veins: No evidence of thrombus. Normal compressibility and flow on color Doppler imaging. Superficial Great Saphenous Vein: No evidence of thrombus. Normal compressibility. Venous Reflux:  None. Other Findings:  None. IMPRESSION: Right popliteal and femoral vein deep venous thrombosis. Electronically Signed   By: Inez Catalina M.D.   On: 03/15/2021 19:58   ECHOCARDIOGRAM COMPLETE  Result Date: 03/16/2021    ECHOCARDIOGRAM REPORT   Patient Name:   Victor [name] Date of Exam: 03/16/2021 Medical Rec #:  [id number]      Height:       70.0 in Accession #:    [id number]     Weight:       208.0 lb Date of Birth:  [birth year]      BSA:          2.122 m Patient Age:    66 years       BP:           129/59 mmHg Patient Gender: M              HR:           72 bpm. Exam Location:  Inpatient Procedure: 2D Echo, Cardiac Doppler, Color Doppler and Intracardiac            Opacification Agent Indications:    I26.02 Pulmonary embolus  History:        Patient has prior history of Echocardiogram examinations, most                 recent 07/17/2019. COPD, Signs/Symptoms:Dyspnea; Risk                 Factors:Dyslipidemia and Sleep Apnea. Cancer. CKD.  Hypothyroidism.  Sonographer:    Jonelle Sidle Dance Referring Phys: [place]  1. Left ventricular ejection fraction, by estimation, is  55 to 60%. The left ventricle has normal function. The left ventricle has no regional wall motion abnormalities. Left ventricular diastolic parameters are consistent with Grade I diastolic dysfunction (impaired relaxation).  2. Right ventricular systolic function is normal. The right ventricular size is normal.  3. The mitral valve is grossly normal. No evidence of mitral valve regurgitation.  4. The aortic valve is grossly normal. Aortic valve regurgitation is not visualized. FINDINGS  Left Ventricle: Left ventricular ejection fraction, by estimation, is 55 to 60%. The left ventricle has normal function. The left ventricle has no regional wall motion abnormalities. Definity contrast agent was given IV to delineate the left ventricular  endocardial borders. The left ventricular internal cavity size was normal in size. There is borderline left ventricular hypertrophy. Left ventricular diastolic parameters are consistent with Grade I diastolic dysfunction (impaired relaxation). Right Ventricle: The right ventricular size is normal. No increase in right ventricular wall thickness. Right ventricular systolic function is normal. Left Atrium: Left atrial size was normal in size. Right Atrium: Right atrial size was normal in size. Pericardium: There is no evidence of pericardial effusion. Mitral Valve: The mitral valve is grossly normal. No evidence of mitral valve regurgitation. Tricuspid Valve: The tricuspid valve is grossly normal. Tricuspid valve regurgitation is not demonstrated. Aortic Valve: The aortic valve is grossly normal. Aortic valve regurgitation is not visualized. Pulmonic Valve: The pulmonic valve was normal in structure. Pulmonic valve regurgitation is not visualized. Aorta: The aortic root and ascending aorta are structurally normal, with no evidence of dilitation. IAS/Shunts: The atrial septum is grossly normal.  LEFT VENTRICLE PLAX 2D LVIDd:         4.20 cm  Diastology LVIDs:         3.10 cm  LV e'  medial:    5.55 cm/s LV PW:         1.10 cm  LV E/e' medial:  16.2 LV IVS:        1.20 cm  LV e' lateral:   6.85 cm/s LVOT diam:     2.30 cm  LV E/e' lateral: 13.1 LV SV:         104 LV SV Index:   49 LVOT Area:     4.15 cm  RIGHT VENTRICLE             IVC RV Basal diam:  2.80 cm     IVC diam: 1.50 cm RV S prime:     10.70 cm/s TAPSE (M-mode): 2.3 cm LEFT ATRIUM             Index       RIGHT ATRIUM           Index LA diam:        4.80 cm 2.26 cm/m  RA Area:     15.00 cm LA Vol (A2C):   64.0 ml 30.15 ml/m RA Volume:   39.30 ml  18.52 ml/m LA Vol (A4C):   68.8 ml 32.42 ml/m LA Biplane Vol: 66.8 ml 31.47 ml/m  AORTIC VALVE LVOT Vmax:   97.00 cm/s LVOT Vmean:  76.000 cm/s LVOT VTI:    0.251 m  AORTA Ao Root diam: 3.70 cm Ao Asc diam:  2.90 cm MITRAL VALVE MV Area (PHT): 2.29 cm     SHUNTS MV Decel Time: 331 msec     Systemic VTI:  0.25 m MV E velocity: 90.00 cm/s   Systemic Diam: 2.30 cm MV A velocity: 127.00 cm/s MV E/A ratio:  0.71 Mertie Moores MD Electronically signed by Mertie Moores MD Signature Date/Time: 03/16/2021/4:49:09 PM    Final    CT Renal Stone Study  Result Date: 03/15/2021 CLINICAL DATA:  Hematuria. EXAM: CT ABDOMEN AND PELVIS WITHOUT CONTRAST TECHNIQUE: Multidetector CT imaging of the abdomen and pelvis was performed following the standard protocol without IV contrast. COMPARISON:  Renal ultrasound 08/05/2020 FINDINGS: Lower chest: Assessed on concurrent chest CT, reported separately. Hepatobiliary: No focal hepatic abnormality on noncontrast exam. Decompressed gallbladder. No calcified gallstone or pericholecystic inflammation. Pancreas: No ductal dilatation or inflammation. Spleen: Mild splenomegaly, spleen measuring 12.4 x 13.1 x 6.5 cm (volume = 550 cm^3). No focal abnormality. Adrenals/Urinary Tract: Normal adrenal glands. No hydronephrosis or renal calculi. Calcifications at the renal hila are felt to be vascular. Minimal symmetric perinephric edema typically chronic. No evidence of  focal renal lesion on this noncontrast exam. Partially distended urinary bladder. No bladder wall thickening or stone. Stomach/Bowel: Decompressed stomach. No small bowel obstruction or inflammation. Scattered colonic diverticulosis without diverticulitis. Appendectomy. Vascular/Lymphatic: Moderate aortic atherosclerosis. Bi-iliac stents in place. No bulky abdominopelvic adenopathy. Reproductive: Prostate is unremarkable. Other: No ascites or free air. Fat containing inguinal hernias, left greater than right. Musculoskeletal: No acute osseous abnormality or focal bone lesion. IMPRESSION: 1. No renal stones or obstructive uropathy. Calcifications at the renal hila are felt to be vascular. 2. Colonic diverticulosis without diverticulitis. 3. Mild splenomegaly. Aortic Atherosclerosis (ICD10-I70.0). Electronically Signed   By: Keith Rake M.D.   On: 03/15/2021 16:20     Time coordinating discharge: Over 30 minutes    Dwyane Dee, MD  [facility] 03/18/2021, 1:33 PM

## 2021-03-18 NOTE — Discharge Instructions (Signed)
Do NOT resume Venetoclax until approved by Dr. [name]  Information on my medicine - ELIQUIS (apixaban)  This medication education was reviewed with me or my healthcare representative as part of my discharge preparation.    Why was Eliquis prescribed for you? Eliquis was prescribed to treat blood clots that may have been found in the veins of your legs (deep vein thrombosis) or in your lungs (pulmonary embolism) and to reduce the risk of them occurring again.  What do You need to know about Eliquis ? Your dose is reduced to ONE 5 mg tablet taken TWICE daily.  Eliquis may be taken with or without food.   Try to take the dose about the same time in the morning and in the evening. If you have difficulty swallowing the tablet whole please discuss with your pharmacist how to take the medication safely.  Take Eliquis exactly as prescribed and DO NOT stop taking Eliquis without talking to the doctor who prescribed the medication.  Stopping may increase your risk of developing a new blood clot.  Refill your prescription before you run out.  After discharge, you should have regular check-up appointments with your healthcare provider that is prescribing your Eliquis.    What do you do if you miss a dose? If a dose of ELIQUIS is not taken at the scheduled time, take it as soon as possible on the same day and twice-daily administration should be resumed. The dose should not be doubled to make up for a missed dose.  Important Safety Information A possible side effect of Eliquis is bleeding. You should call your healthcare provider right away if you experience any of the following: ? Bleeding from an injury or your nose that does not stop. ? Unusual colored urine (red or dark brown) or unusual colored stools (red or black). ? Unusual bruising for unknown reasons. ? A serious fall or if you hit your head (even if there is no bleeding).  Some medicines may interact with Eliquis and might  increase your risk of bleeding or clotting while on Eliquis. To help avoid this, consult your healthcare provider or pharmacist prior to using any new prescription or non-prescription medications, including herbals, vitamins, non-steroidal anti-inflammatory drugs (NSAIDs) and supplements.  This website has more information on Eliquis (apixaban): http://[email or web address].com/eliquis/home

## 2021-03-20 ENCOUNTER — Inpatient Hospital Stay: Payer: Medicare Other

## 2021-03-20 ENCOUNTER — Inpatient Hospital Stay (HOSPITAL_BASED_OUTPATIENT_CLINIC_OR_DEPARTMENT_OTHER): Payer: Medicare Other | Admitting: Hematology & Oncology

## 2021-03-20 ENCOUNTER — Other Ambulatory Visit: Payer: Self-pay

## 2021-03-20 ENCOUNTER — Encounter: Payer: Self-pay | Admitting: Hematology & Oncology

## 2021-03-20 VITALS — BP 143/59 | HR 80 | Temp 97.8°F | Resp 17 | Ht 70.0 in | Wt 195.6 lb

## 2021-03-20 VITALS — BP 143/59 | HR 80 | Temp 97.8°F | Resp 17

## 2021-03-20 DIAGNOSIS — Z79899 Other long term (current) drug therapy: Secondary | ICD-10-CM | POA: Diagnosis not present

## 2021-03-20 DIAGNOSIS — C9501 Acute leukemia of unspecified cell type, in remission: Secondary | ICD-10-CM | POA: Diagnosis not present

## 2021-03-20 DIAGNOSIS — M7989 Other specified soft tissue disorders: Secondary | ICD-10-CM | POA: Diagnosis not present

## 2021-03-20 DIAGNOSIS — D696 Thrombocytopenia, unspecified: Secondary | ICD-10-CM | POA: Diagnosis not present

## 2021-03-20 DIAGNOSIS — Z452 Encounter for adjustment and management of vascular access device: Secondary | ICD-10-CM

## 2021-03-20 DIAGNOSIS — C969 Malignant neoplasm of lymphoid, hematopoietic and related tissue, unspecified: Secondary | ICD-10-CM | POA: Diagnosis present

## 2021-03-20 DIAGNOSIS — C9200 Acute myeloblastic leukemia, not having achieved remission: Secondary | ICD-10-CM | POA: Diagnosis present

## 2021-03-20 DIAGNOSIS — R58 Hemorrhage, not elsewhere classified: Secondary | ICD-10-CM | POA: Diagnosis not present

## 2021-03-20 DIAGNOSIS — Z5111 Encounter for antineoplastic chemotherapy: Secondary | ICD-10-CM | POA: Diagnosis not present

## 2021-03-20 DIAGNOSIS — Z7901 Long term (current) use of anticoagulants: Secondary | ICD-10-CM | POA: Diagnosis not present

## 2021-03-20 DIAGNOSIS — I2699 Other pulmonary embolism without acute cor pulmonale: Secondary | ICD-10-CM | POA: Diagnosis not present

## 2021-03-20 DIAGNOSIS — C9500 Acute leukemia of unspecified cell type not having achieved remission: Secondary | ICD-10-CM

## 2021-03-20 LAB — CMP (CANCER CENTER ONLY)
ALT: 11 U/L (ref 0–44)
AST: 11 U/L — ABNORMAL LOW (ref 15–41)
Albumin: 3.6 g/dL (ref 3.5–5.0)
Alkaline Phosphatase: 50 U/L (ref 38–126)
Anion gap: 6 (ref 5–15)
BUN: 17 mg/dL (ref 8–23)
CO2: 26 mmol/L (ref 22–32)
Calcium: 8.7 mg/dL — ABNORMAL LOW (ref 8.9–10.3)
Chloride: 105 mmol/L (ref 98–111)
Creatinine: 0.97 mg/dL (ref 0.61–1.24)
GFR, Estimated: >60 mL/min (ref >60)
Glucose, Bld: 126 mg/dL — ABNORMAL HIGH (ref 70–99)
Potassium: 3.7 mmol/L (ref 3.5–5.1)
Sodium: 137 mmol/L (ref 135–145)
Total Bilirubin: 0.6 mg/dL (ref 0.3–1.2)
Total Protein: 5.1 g/dL — ABNORMAL LOW (ref 6.5–8.1)

## 2021-03-20 LAB — CBC WITH DIFFERENTIAL (CANCER CENTER ONLY)
Abs Immature Granulocytes: 0 10*3/uL (ref 0.00–0.07)
Basophils Absolute: 0 10*3/uL (ref 0.0–0.1)
Basophils Relative: 0 %
Eosinophils Absolute: 0 10*3/uL (ref 0.0–0.5)
Eosinophils Relative: 0 %
HCT: 30.5 % — ABNORMAL LOW (ref 39.0–52.0)
Hemoglobin: 10.1 g/dL — ABNORMAL LOW (ref 13.0–17.0)
Immature Granulocytes: 0 %
Lymphocytes Relative: 62 %
Lymphs Abs: 0.5 10*3/uL — ABNORMAL LOW (ref 0.7–4.0)
MCH: 32.5 pg (ref 26.0–34.0)
MCHC: 33.1 g/dL (ref 30.0–36.0)
MCV: 98.1 fL (ref 80.0–100.0)
Monocytes Absolute: 0.1 10*3/uL (ref 0.1–1.0)
Monocytes Relative: 6 %
Neutro Abs: 0.3 10*3/uL — CL (ref 1.7–7.7)
Neutrophils Relative %: 32 %
Platelet Count: 31 10*3/uL — ABNORMAL LOW (ref 150–400)
RBC: 3.11 MIL/uL — ABNORMAL LOW (ref 4.22–5.81)
RDW: 15.3 % (ref 11.5–15.5)
WBC Count: 0.9 10*3/uL — CL (ref 4.0–10.5)
nRBC: 0 % (ref 0.0–0.2)

## 2021-03-20 LAB — PREPARE PLATELET PHERESIS: Unit division: 0

## 2021-03-20 LAB — BPAM PLATELET PHERESIS
Blood Product Expiration Date: 202205142359
ISSUE DATE / TIME: 202205141121
Unit Type and Rh: 6200

## 2021-03-20 MED ORDER — HEPARIN SOD (PORK) LOCK FLUSH 100 UNIT/ML IV SOLN
250.0000 [IU] | Freq: Once | INTRAVENOUS | Status: AC
  Administered 2021-03-20: 500 [IU]
  Filled 2021-03-20: qty 5

## 2021-03-20 MED ORDER — SODIUM CHLORIDE 0.9% FLUSH
10.0000 mL | Freq: Once | INTRAVENOUS | Status: AC
  Administered 2021-03-20: 10 mL
  Filled 2021-03-20: qty 10

## 2021-03-20 NOTE — Progress Notes (Signed)
Hematology and Oncology Follow Up Visit  Victor [name] [id number] [birth year] 85 y.o. 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 twice daily --started on 03/16/2021     Interim History:  Mr. Vanderwerf is back for follow-up.  He recent was taken out of the hospital.  He was admitted because of a pulmonary embolism.  He did not have right heart strain.  He was found to have a thrombus in his right leg.  He did have a filter placed.  This was in the IVC.  Due to to the thrombocytopenia, we had to be very careful with his anticoagulation.  I felt that he did need some form of anticoagulation because of the pulmonary embolism.  As such, we moved him from heparin to Eliquis.  He did not get a loading dose of Eliquis.  He feels okay.  There is no chest wall pain.  He has had no cough.  There is been no bleeding.  He does have some ecchymoses on his extremities.  There is been no problems with fever.  His white cell count is on the low side.  He will restart prophylactic antibiotics with Levaquin/Diflucan/acyclovir.  He has had no nausea or vomiting.  Overall, his performance status is ECOG 1.      Medications:  Current Outpatient Medications:  .  acetaminophen (TYLENOL) 500 MG tablet, Take 1,000 mg by mouth every 6 (six) hours as needed for moderate pain., Disp: , Rfl:  .  acyclovir (ZOVIRAX) 400 MG tablet, Take 1 tablet (400 mg total) by mouth 2 (two) times daily as needed., Disp: 60 tablet, Rfl: 2 .  apixaban (ELIQUIS) 5 MG TABS tablet, Take 1 tablet (5 mg total) by mouth 2 (two) times daily., Disp: 60 tablet, Rfl: 3 .  apixaban (ELIQUIS) 5 MG TABS tablet, Take 1 tablet (5 mg total) by mouth 2 (two) times daily., Disp: 60 tablet, Rfl: 3 .  doxazosin (CARDURA) 2 MG tablet, Take 1 tablet (2 mg total) by mouth at bedtime.,  Disp: 90 tablet, Rfl: 3 .  fluticasone (FLONASE) 50 MCG/ACT nasal spray, Place 2 sprays into the nose 2 (two) times daily as needed for allergies., Disp: , Rfl:  .  levothyroxine (SYNTHROID) 175 MCG tablet, Take 175 mcg by mouth daily., Disp: , Rfl:  .  lidocaine-prilocaine (EMLA) cream, Apply 1 application topically as needed for up to 30 doses., Disp: 30 g, Rfl: 0 .  Loratadine (CLARITIN PO), Take 10 mg by mouth at bedtime. Allertec, Disp: , Rfl:  .  Melatonin 5 MG CHEW, Chew 1 tablet by mouth daily as needed (sleep)., Disp: , Rfl:  .  potassium chloride (MICRO-K) 10 MEQ CR capsule, Take 10 mEq by mouth in the morning and at bedtime., Disp: , Rfl:  .  propranolol ER (INDERAL LA) 80 MG 24 hr capsule, Take 1 capsule (80 mg total) by mouth daily., Disp: 90 capsule, Rfl: 3 .  simvastatin (ZOCOR) 40 MG tablet, TAKE ONE TABLET BY MOUTH DAILY AT BEDTIME (Patient taking differently: Take 40 mg by mouth daily at 6 PM.), Disp: 90 tablet, Rfl: 3 .  hydrALAZINE (APRESOLINE) 25 MG tablet, Take 1 tablet (25 mg total) by mouth 3 (three) times daily as needed. Take if standing BP >140/80 mm Hg, Disp: 90 tablet, Rfl: 2 .  venetoclax 100 MG TABS, Take  100 mg by mouth in the morning, at noon, in the evening, and at bedtime. (Patient not taking: Reported on 03/20/2021), Disp: , Rfl:  No current facility-administered medications for this visit.  Facility-Administered Medications Ordered in Other Visits:  .  heparin lock flush 100 unit/mL, 250 Units, Intracatheter, Once, Betsy Coder B, MD .  sodium chloride flush (NS) 0.9 % injection 10 mL, 10 mL, Intracatheter, PRN, Ladell Pier, MD, 10 mL at 11/03/20 1625 .  sodium chloride flush (NS) 0.9 % injection 10 mL, 10 mL, Intravenous, PRN, [place], Sarah M, NP, 10 mL at 02/20/21 0900 .  sodium chloride flush (NS) 0.9 % injection 10 mL, 10 mL, Intracatheter, Once, Benay Spice Izola Price, MD  Allergies: No Known Allergies  Past Medical History, Surgical history, Social  history, and Family History were reviewed and updated.  Review of Systems: Review of Systems  Constitutional: Negative.   HENT:  Negative.   Eyes: Negative.   Respiratory: Negative.   Cardiovascular: Negative.   Gastrointestinal: Negative.   Endocrine: Negative.   Genitourinary: Negative.    Musculoskeletal: Negative.   Skin: Negative.   Neurological: Negative.   Hematological: Negative.   Psychiatric/Behavioral: Negative.     Physical Exam:  height is _0  (1.778 m) and weight is 195 lb 9.6 oz (88.7 kg). His oral temperature is 97.8 F (36.6 C). His blood pressure is 143/59 (abnormal) and his pulse is 80. His respiration is 17 and oxygen saturation is 98%.   Wt Readings from Last 3 Encounters:  03/20/21 195 lb 9.6 oz (88.7 kg)  03/15/21 208 lb (94.3 kg)  03/06/21 208 lb (94.3 kg)    Physical Exam Vitals reviewed.  HENT:     Head: Normocephalic and atraumatic.  Eyes:     Pupils: Pupils are equal, round, and reactive to light.  Cardiovascular:     Rate and Rhythm: Normal rate and regular rhythm.     Heart sounds: Normal heart sounds.  Pulmonary:     Effort: Pulmonary effort is normal.     Breath sounds: Normal breath sounds.  Abdominal:     General: Bowel sounds are normal.     Palpations: Abdomen is soft.  Musculoskeletal:        General: No tenderness or deformity. Normal range of motion.     Cervical back: Normal range of motion.  Lymphadenopathy:     Cervical: No cervical adenopathy.  Skin:    General: Skin is warm and dry.     Findings: No erythema or rash.  Neurological:     Mental Status: He is alert and oriented to person, place, and time.  Psychiatric:        Behavior: Behavior normal.        Thought Content: Thought content normal.        Judgment: Judgment normal.      Lab Results  Component Value Date   WBC 0.9 (LL) 03/20/2021   HGB 10.1 (L) 03/20/2021   HCT 30.5 (L) 03/20/2021   MCV 98.1 03/20/2021   PLT 31 (L) 03/20/2021      Chemistry      Component Value Date/Time   NA 137 03/20/2021 1140   NA 142 03/30/2015 1149   K 3.7 03/20/2021 1140   K 4.5 03/30/2015 1149   CL 105 03/20/2021 1140   CO2 26 03/20/2021 1140   CO2 25 03/30/2015 1149   BUN 17 03/20/2021 1140   BUN 17.1 03/30/2015 1149   CREATININE 0.97 03/20/2021 1140  CREATININE 1.2 03/30/2015 1149      Component Value Date/Time   CALCIUM 8.7 (L) 03/20/2021 1140   CALCIUM 8.7 03/30/2015 1149   ALKPHOS 50 03/20/2021 1140   AST 11 (L) 03/20/2021 1140   ALT 11 03/20/2021 1140   BILITOT 0.6 03/20/2021 1140       Impression and Plan: Mr. Boozer is a very nice 85 year old white male who has acute myeloid leukemia.  He does have a adverse prognostic feature with respect to the FLT3 mutation.  I know there are targeted therapies for patients who have leukemia with this FLT3 mutation.  Again, Dr. Linus Orn is doing a fantastic job with him.  He is responding well to the Vidaza and the venetoclax.  There is no indication that we have to put him on one of the new targeted therapy.  We now are dealing with thromboembolic disease despite the fact that he has significant thrombocytopenia.  However, I do feel that anticoagulation would be appropriate for him.  We does have to watch out with bleeding.  His platelet count is doing fine today.  The platelet count is 31,000.  We will have to check his blood counts twice a week.  We will have to do this for the next several weeks.  I do not think there is a problem with respect to the leukemia.  I think he is done well with this.  I would like to see him back in 3 weeks myself.   Volanda Napoleon, MD 5/16/20221:31 PM

## 2021-03-20 NOTE — Patient Instructions (Signed)

## 2021-03-22 ENCOUNTER — Ambulatory Visit: Payer: Medicare Other | Admitting: Cardiology

## 2021-03-22 ENCOUNTER — Other Ambulatory Visit: Payer: Self-pay

## 2021-03-22 ENCOUNTER — Encounter: Payer: Self-pay | Admitting: Cardiology

## 2021-03-22 VITALS — BP 137/67 | HR 89 | Temp 98.0°F | Resp 17 | Ht 70.0 in | Wt 206.2 lb

## 2021-03-22 DIAGNOSIS — I2699 Other pulmonary embolism without acute cor pulmonale: Secondary | ICD-10-CM

## 2021-03-22 DIAGNOSIS — E7800 Pure hypercholesterolemia, unspecified: Secondary | ICD-10-CM

## 2021-03-22 DIAGNOSIS — I70203 Unspecified atherosclerosis of native arteries of extremities, bilateral legs: Secondary | ICD-10-CM

## 2021-03-22 DIAGNOSIS — I82411 Acute embolism and thrombosis of right femoral vein: Secondary | ICD-10-CM

## 2021-03-22 NOTE — Progress Notes (Signed)
Primary Physician/Referring:  Ronnald Nian, DO  Patient ID: [name], male    DOB: 1936-10-03, 85 y.o.   MRN: [id number]  Chief Complaint  Patient presents with  . PAD  . Hypertension  . ORTHOSTATIC PRESSURE    3 MONTH   HPI:    ARLING CERONE  is a 85 y.o. 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    He was admitted to the hospital with chest pain and was found to have subsegmental pulmonary embolism with right leg DVT and was started on Eliquis and underwent IVC placement on 03/17/2021 with Bard Denali filter.  He is presently tolerating anticoagulation well without bleeding diathesis.  He has mild right leg edema.  He has not had any further chest pain.  He denies any dyspnea, PND or orthopnea.   Past Medical History:  Diagnosis Date  . Arthritis   . Atherosclerosis   . Atherosclerotic PVD with intermittent claudication (HCC)    stent left leg  . BPH (benign prostatic hyperplasia)   . Bulging lumbar disc   . Cancer ([place]) 11/12/14   vocal cord  carcinoma in situ , radiation; thyroid cancer  . Carotid bruit   . COPD (chronic obstructive pulmonary disease) ([place])   . DDD (degenerative disc disease), lumbar   . Dysphonia   . Dysplasia of true vocal cord   . GERD (gastroesophageal reflux disease)   . H/O carotid atherosclerosis    b/l  . Hyperlipidemia   . Hypothyroidism   . Occasional tremors    left hand managed with propranolol  . Peripheral vascular angioplasty status with implants and grafts   . Precancerous lesion 03/05/2018   premelanoma removed from back.   . Radiation 03/10/15- 04/18/16   Larynx  . Sleep apnea    hasn't used CPAP in years  . Spondylosis  of lumbosacral region   . Thyroid disease    Past Surgical History:  Procedure Laterality Date  . APPENDECTOMY    . COLONOSCOPY W/ POLYPECTOMY    . DIODE LASER APPLICATION Left 56/81/2751   Procedure: DIODE LASER APPLICATION;  Surgeon: Hayden Pedro, MD;  Location: [facility];  Service: Ophthalmology;  Laterality: Left;  . IR IMAGING GUIDED PORT INSERTION  01/16/2021  . IR IVC FILTER PLMT / S&I /IMG GUID/MOD SED  03/17/2021  . MEMBRANE PEEL Left 08/18/2019   Procedure: MEMBRANE PEEL;  Surgeon: Hayden Pedro, MD;  Location: [facility];  Service: Ophthalmology;  Laterality: Left;  . MOUTH SURGERY     tooth extraction  . PARS PLANA VITRECTOMY Left 08/18/2019   PARS PLANA VITRECTOMY 27 GAUGE (Left)  . PARS PLANA VITRECTOMY 27 GAUGE Left 08/18/2019   Procedure: PARS PLANA VITRECTOMY 27 GAUGE;  Surgeon: Hayden Pedro, MD;  Location: [place];  Service: Ophthalmology;  Laterality: Left;  . PHOTOCOAGULATION WITH LASER Left 08/18/2019   Procedure: PHOTOCOAGULATION WITH LASER;  Surgeon: Hayden Pedro, MD;  Location: [facility] [place];  Service: Ophthalmology;  Laterality: Left;  . THYROID SURGERY    . TONSILLECTOMY    . vocal cord biopsy  11/12/14   squamous cell carcinoma in situ   Family History  Problem Relation Age of Onset  . Colon cancer Father   . Cancer Sister  Died of metastatic cancer  . Leukemia Neg Hx     Social History   Tobacco Use  . Smoking status: Former Smoker    Packs/day: 2.00    Years: 51.00    Pack years: 102.00    Types: Cigarettes    Start date: 59    Quit date: 03/05/2004    Years since quitting: 17.0  . Smokeless tobacco: Never Used  Substance Use Topics  . Alcohol use: Yes    Alcohol/week: 3.0 standard drinks    Types: 3 Glasses of wine per week    Comment: occ   Marital Status: Married  ROS  Review of Systems  Cardiovascular: Positive for leg swelling. Negative for chest pain and dyspnea on exertion.  Musculoskeletal: Positive for arthritis. Negative for  muscle cramps.  Gastrointestinal: Negative for melena.   Objective  Blood pressure 137/67, pulse 89, temperature 98 F (36.7 C), temperature source Temporal, resp. rate 17, height 5\' 10"  (1.778 m), weight 206 lb 3.2 oz (93.5 kg), SpO2 95 %.  Vitals with BMI 03/22/2021 03/22/2021 03/20/2021  Height - 5\' 10"  5\' 10"   Weight - 206 lbs 3 oz 195 lbs 10 oz  BMI - 20.35 59.74  Systolic 163 845 364  Diastolic 67 67 59  Pulse 89 81 80    Orthostatic VS for the past 72 hrs (Last 3 readings):  Patient Position BP Location Cuff Size  03/22/21 1016 Standing Left Arm Normal  03/22/21 1005 Sitting Left Wrist Normal     Physical Exam Neck:     Vascular: No JVD.  Cardiovascular:     Rate and Rhythm: Normal rate and regular rhythm.     Pulses: Intact distal pulses.          Carotid pulses are 2+ on the right side and 2+ on the left side.      Femoral pulses are 2+ on the right side and 1+ on the left side.      Popliteal pulses are 2+ on the right side and 1+ on the left side.       Dorsalis pedis pulses are 2+ on the right side and 1+ on the left side.       Posterior tibial pulses are 1+ on the right side and 0 on the left side.     Heart sounds: Normal heart sounds. No murmur heard. No gallop.   Pulmonary:     Effort: Pulmonary effort is normal.     Breath sounds: Normal breath sounds.  Abdominal:     General: Bowel sounds are normal.     Palpations: Abdomen is soft.  Musculoskeletal:     Right lower leg: Edema (1-2 +) present.    Laboratory examination:   Recent Labs    08/05/20 0526 08/05/20 1501 08/06/20 0916 08/22/20 1014 03/17/21 0550 03/18/21 0349 03/20/21 1140  NA 142 142 142   < > 141 140 137  K 3.7 3.8 3.8   < > 3.8 3.7 3.7  CL 108 109 109   < > 107 108 105  CO2 19* 20* 23   < > 29 28 26   GLUCOSE 108* 113* 116*   < > 93 99 126*  BUN 48* 43* 30*   < > 17 14 17   CREATININE 2.06* 1.71* 1.43*   < > 0.84 0.94 0.97  CALCIUM 6.5* 6.6* 6.5*   < > 8.2* 8.2* 8.7*  GFRNONAA  29* 36* 45*   < > >60 >60 >60  GFRAA 34*  42* 52*  --   --   --   --    < > = values in this interval not displayed.   estimated creatinine clearance is 66.3 mL/min (by C-G formula based on SCr of 0.97 mg/dL).  CMP Latest Ref Rng & Units 03/20/2021 03/18/2021 03/17/2021  Glucose 70 - 99 mg/dL 126(H) 99 93  BUN 8 - 23 mg/dL 17 14 17   Creatinine 0.61 - 1.24 mg/dL 0.97 0.94 0.84  Sodium 135 - 145 mmol/L 137 140 141  Potassium 3.5 - 5.1 mmol/L 3.7 3.7 3.8  Chloride 98 - 111 mmol/L 105 108 107  CO2 22 - 32 mmol/L 26 28 29   Calcium 8.9 - 10.3 mg/dL 8.7(L) 8.2(L) 8.2(L)  Total Protein 6.5 - 8.1 g/dL 5.1(L) - -  Total Bilirubin 0.3 - 1.2 mg/dL 0.6 - -  Alkaline Phos 38 - 126 U/L 50 - -  AST 15 - 41 U/L 11(L) - -  ALT 0 - 44 U/L 11 - -   CBC Latest Ref Rng & Units 03/20/2021 03/18/2021 03/17/2021  WBC 4.0 - 10.5 K/uL 0.9(LL) 1.2(LL) 1.7(L)  Hemoglobin 13.0 - 17.0 g/dL 10.1(L) 9.6(L) 9.6(L)  Hematocrit 39.0 - 52.0 % 30.5(L) 29.9(L) 29.8(L)  Platelets 150 - 400 K/uL 31(L) 21(LL) 24(LL)    Lipid Panel Cholesterol, total 139.000 m 11/12/2018 HDL 37.700 mg 11/12/2018 LDL 71.000 mg 11/12/2018 Triglycerides 149.000 m 11/12/2018    HEMOGLOBIN A1C No results found for: HGBA1C, MPG TSH No results for input(s): TSH in the last 8760 hours.  External labs:   Labs 12/05/2020:  Hb 12.2/HCT 36.9, WBC 3.3, platelets 94. Microcytic indicis.  Sodium 142, potassium 4.0, BUN 25, creatinine 0.91, CMP otherwise normal. Serum glucose 105 mg.  Labs 11/09/2020:  TSH normal.  Medications and allergies  No Known Allergies   Current Meds  Medication Sig  . acetaminophen (TYLENOL) 500 MG tablet Take 1,000 mg by mouth every 6 (six) hours as needed for moderate pain.  Marland Kitchen acyclovir (ZOVIRAX) 400 MG tablet Take 1 tablet (400 mg total) by mouth 2 (two) times daily as needed.  Marland Kitchen apixaban (ELIQUIS) 5 MG TABS tablet Take 1 tablet (5 mg total) by mouth 2 (two) times daily.  Marland Kitchen doxazosin (CARDURA) 2 MG tablet Take 1 tablet  (2 mg total) by mouth at bedtime.  . fluconazole (DIFLUCAN) 200 MG tablet Take 200 mg by mouth daily.  . fluticasone (FLONASE) 50 MCG/ACT nasal spray Place 2 sprays into the nose 2 (two) times daily as needed for allergies.  . hydrALAZINE (APRESOLINE) 25 MG tablet Take 1 tablet (25 mg total) by mouth 3 (three) times daily as needed. Take if standing BP >140/80 mm Hg  . levofloxacin (LEVAQUIN) 500 MG tablet Take 500 mg by mouth daily.  Marland Kitchen levothyroxine (SYNTHROID) 175 MCG tablet Take 175 mcg by mouth daily.  Marland Kitchen lidocaine-prilocaine (EMLA) cream Apply 1 application topically as needed for up to 30 doses.  . Loratadine (CLARITIN PO) Take 10 mg by mouth at bedtime. Allertec  . Melatonin 5 MG CHEW Chew 1 tablet by mouth daily as needed (sleep).  . potassium chloride (MICRO-K) 10 MEQ CR capsule Take 10 mEq by mouth in the morning and at bedtime.  . propranolol ER (INDERAL LA) 80 MG 24 hr capsule Take 1 capsule (80 mg total) by mouth daily.  . simvastatin (ZOCOR) 40 MG tablet TAKE ONE TABLET BY MOUTH DAILY AT BEDTIME (Patient taking differently: Take 40 mg by mouth daily at 6 PM.)  . venetoclax  100 MG TABS Take 100 mg by mouth in the morning, at noon, in the evening, and at bedtime.     Radiology:   MRI of the brain 09/06/2020: Chronic microvascular ischemic disease. Remote lacunar infarct left cerebellar hemisphere.  CTA Head & Neck 09/16/2020: 1. No evidence of acute intracranial abnormality. However, CT is relatively insensitive for the detection of acute infarct within the first 24-48 hours, and MRI may be indicated if there is high clinical suspicion.  2. Multifocal high-grade stenosis versus occlusion of the nondominant right vertebral artery, which although likely chronic could be related to the reported complaint of dizziness. Consider neurovascular referral for further assessment.  3. Lobulated soft tissue density lesion interposed between the right levator scapula and trapezius muscles  measuring up to 2.2 cm, which is favored to represent a venous malformation with associated phlebolith and could be further evaluated with ultrasound.   Aortic arch: No significant stenosis of the origins of the major arch vessels.  Left carotid system: Atherosclerotic calcification at the common carotidartery bifurcation without evidence of significant (50% or greater) stenosis or occlusion.  Right carotid system: Atherosclerotic calcification at the common carotid artery bifurcation without evidence of significant (50% or greater) stenosis or occlusion.  Vertebral arteries: Left dominant. Multifocal high-grade stenosis versus occlusion, including areas of nonopacification of the V2, V3, and V4 segments, of the nondominant right vertebral artery. Mild to moderate atherosclerotic narrowing at the origin of the left vertebral artery without substantial narrowing distally.   Cardiac Studies:   Exercise Treadmill Stress Test 05/26/2018: Indication: CP  The patient exercised on Bruce protocol for 6:00 min. Patient achieved 7.05 METS and reached HR 107 bpm, which is 76% of maximum age-predicted HR. Stress test terminated due to Fatigue.  Exercise capacity was below average for age. HR Response to Exercise: Attenuated secondary to medication. BP Response to Exercise: Normal resting BP- exaggerated response. Peak BP 200/80 mmHg Chest Pain: none. Arrhythmias: none. ST Changes: With peak exercise there was no ST-T changes of ischemia.  Overall Impression: Inadequate subotpimal stress test Consider further cardiac evaluation including cardiac catheterization if clinically indicated.  ABD aortic duplex 10/16/2018: Mild ectasia involving mid and distal aorta. Normal iliac artery velocity. Mixed plaque noted.  LE Doppler 10/16/2018: Indication: Bilateral common iliac artery stent and left external iliac artery stent by Dr. Denyce Robert Moderate velocity increase at the right posterior tibial artery  suggestive of > 50% stenosis. Moderate velocity increase at the left distal superficial femoral and posterior tibial arteries suggestive of >50% stenosis. Normal triphasic waveforms of both ankles. Mildly decreased bilateral resting ABI at 0.93.  Carotid artery duplex 05/04/2020: Right Carotid: Velocities in the right ICA are consistent with a 1-39% stenosis. Non-hemodynamically significant plaque <50% noted in the CCA. Left Carotid: Velocities in the left ICA are consistent with a 1-39% stenosis. Non-hemodynamically significant plaque <50% noted in the CCA. Vertebrals:  Left vertebral artery demonstrates antegrade flow. Right vertebral artery demonstrates high resistant flow.  Minimum flow noted in the vertebral artery. High resistant waveform is evident suggesting  distal occlusion vs high grade stenosis. Subclavians: Right subclavian artery flow was disturbed. Normal flow hemodynamics were seen in the left subclavian artery.  Echocardiogram 07/17/2019:  Left ventricle cavity is normal in size. Mild concentric hypertrophy of the left ventricle. Normal LV systolic function with EF 57%. Normal global wall motion. Doppler evidence of grade I (impaired) diastolic dysfunction, normal LAP.  Moderate (Grade II) mitral regurgitation. Inadequate TR jet to estimate pulmonary artery systolic  pressure. Normal right atrial pressure.  IVC filter placement 03/17/2021: Successful placement of Bard Denali IVC filter.  The filter is potentially retrievable.  EKG:    EKG 12/05/2020: Probably sinus rhythm at the rate of 77 bpm, cannot exclude ectopic atrial rhythm.  Normal axis, early repolarization.  Low-voltage complexes.    Assessment     ICD-10-CM   1. Atherosclerosis of artery of both lower extremities (HCC)  I70.203   2. Acute deep vein thrombosis (DVT) of femoral vein of right lower extremity (HCC)  I82.411   3. Other acute pulmonary embolism without acute cor pulmonale (HCC)  I26.99   4.  Hypercholesteremia  E78.00 Lipid Panel w/reflex Direct LDL    Medications Discontinued During This Encounter  Medication Reason  . apixaban (ELIQUIS) 5 MG TABS tablet Error  . fluconazole (DIFLUCAN) 100 MG tablet Error  . sodium chloride flush (NS) 0.9 % injection 10 mL   . sodium chloride flush (NS) 0.9 % injection 10 mL     No orders of the defined types were placed in this encounter.  Orders Placed This Encounter  Procedures  . Lipid Panel w/reflex Direct LDL   Recommendations:   MING MCMANNIS is a 85 y.o. 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    He was admitted to the hospital with chest pain and was found to have subsegmental pulmonary embolism with right leg DVT and was started on Eliquis and underwent IVC placement on 03/17/2021 with Bard Denali filter.   From cardiac/vascular standpoint although he has peripheral artery disease, he has remained stable without claudication and peripheral arterial pulses are still intact.  He does have intracerebral vascular disease and vertebral stenosis that is asymptomatic.  He is now tolerating Eliquis, however very concerning that he has platelets are critically low and is being followed almost on a weekly basis by hematology.  Although IVC filter is retrievable, best option is to treat his acute PE/DVT with at least a 58-month course of Eliquis if he tolerates it without bleeding complication and leave the IVC filter in situ permanently.  I will certainly send my recommendations to his oncologist.  Otherwise stable from cardiac standpoint, I will see him back in a year.  His cardiac risk factors including hypertension, hyperlipidemia are  well managed.  2 years ago his lipids were well controlled, needs lipid profile testing.  He can have this done with [place] [facility]ologic regular blood draws but he has.    [name], MD, [facility] 03/22/2021, 9:03 PM Office: [phone]  Cc: Burney Gauze, MD

## 2021-03-23 ENCOUNTER — Other Ambulatory Visit: Payer: Self-pay | Admitting: Family Medicine

## 2021-03-23 ENCOUNTER — Telehealth: Payer: Self-pay | Admitting: *Deleted

## 2021-03-23 ENCOUNTER — Other Ambulatory Visit: Payer: Medicare Other

## 2021-03-23 ENCOUNTER — Inpatient Hospital Stay: Payer: Medicare Other

## 2021-03-23 ENCOUNTER — Other Ambulatory Visit (HOSPITAL_COMMUNITY): Payer: Self-pay

## 2021-03-23 VITALS — BP 122/55 | HR 88 | Temp 98.3°F

## 2021-03-23 DIAGNOSIS — C9500 Acute leukemia of unspecified cell type not having achieved remission: Secondary | ICD-10-CM

## 2021-03-23 DIAGNOSIS — Z95828 Presence of other vascular implants and grafts: Secondary | ICD-10-CM

## 2021-03-23 DIAGNOSIS — K219 Gastro-esophageal reflux disease without esophagitis: Secondary | ICD-10-CM

## 2021-03-23 DIAGNOSIS — Z5111 Encounter for antineoplastic chemotherapy: Secondary | ICD-10-CM | POA: Diagnosis not present

## 2021-03-23 LAB — CBC WITH DIFFERENTIAL (CANCER CENTER ONLY)
Abs Immature Granulocytes: 0 10*3/uL (ref 0.00–0.07)
Basophils Absolute: 0 10*3/uL (ref 0.0–0.1)
Basophils Relative: 2 %
Eosinophils Absolute: 0 10*3/uL (ref 0.0–0.5)
Eosinophils Relative: 0 %
HCT: 31.9 % — ABNORMAL LOW (ref 39.0–52.0)
Hemoglobin: 10.6 g/dL — ABNORMAL LOW (ref 13.0–17.0)
Immature Granulocytes: 0 %
Lymphocytes Relative: 81 %
Lymphs Abs: 0.5 10*3/uL — ABNORMAL LOW (ref 0.7–4.0)
MCH: 32.4 pg (ref 26.0–34.0)
MCHC: 33.2 g/dL (ref 30.0–36.0)
MCV: 97.6 fL (ref 80.0–100.0)
Monocytes Absolute: 0 10*3/uL — ABNORMAL LOW (ref 0.1–1.0)
Monocytes Relative: 3 %
Neutro Abs: 0.1 10*3/uL — CL (ref 1.7–7.7)
Neutrophils Relative %: 14 %
Platelet Count: 27 10*3/uL — ABNORMAL LOW (ref 150–400)
RBC: 3.27 MIL/uL — ABNORMAL LOW (ref 4.22–5.81)
RDW: 15.7 % — ABNORMAL HIGH (ref 11.5–15.5)
WBC Count: 0.6 10*3/uL — CL (ref 4.0–10.5)
nRBC: 0 % (ref 0.0–0.2)

## 2021-03-23 LAB — CMP (CANCER CENTER ONLY)
ALT: 9 U/L (ref 0–44)
AST: 10 U/L — ABNORMAL LOW (ref 15–41)
Albumin: 3.9 g/dL (ref 3.5–5.0)
Alkaline Phosphatase: 56 U/L (ref 38–126)
Anion gap: 7 (ref 5–15)
BUN: 17 mg/dL (ref 8–23)
CO2: 28 mmol/L (ref 22–32)
Calcium: 9.3 mg/dL (ref 8.9–10.3)
Chloride: 106 mmol/L (ref 98–111)
Creatinine: 1.13 mg/dL (ref 0.61–1.24)
GFR, Estimated: >60 mL/min (ref >60)
Glucose, Bld: 98 mg/dL (ref 70–99)
Potassium: 3.9 mmol/L (ref 3.5–5.1)
Sodium: 141 mmol/L (ref 135–145)
Total Bilirubin: 0.8 mg/dL (ref 0.3–1.2)
Total Protein: 5.7 g/dL — ABNORMAL LOW (ref 6.5–8.1)

## 2021-03-23 MED ORDER — SODIUM CHLORIDE 0.9% FLUSH
10.0000 mL | Freq: Once | INTRAVENOUS | Status: AC
Start: 2021-03-23 — End: 2021-03-23
  Administered 2021-03-23: 10 mL via INTRAVENOUS
  Filled 2021-03-23: qty 10

## 2021-03-23 MED ORDER — HEPARIN SOD (PORK) LOCK FLUSH 100 UNIT/ML IV SOLN
500.0000 [IU] | Freq: Once | INTRAVENOUS | Status: AC
Start: 2021-03-23 — End: 2021-03-23
  Administered 2021-03-23: 500 [IU] via INTRAVENOUS
  Filled 2021-03-23: qty 5

## 2021-03-23 NOTE — Patient Instructions (Signed)
Implanted Port Insertion, Care After This sheet gives you information about how to care for yourself after your procedure. Your health care provider may also give you more specific instructions. If you have problems or questions, contact your health care provider. What can I expect after the procedure? After the procedure, it is common to have:  Discomfort at the port insertion site.  Bruising on the skin over the port. This should improve over 3-4 days. Follow these instructions at home: Port care  After your port is placed, you will get a manufacturer's information card. The card has information about your port. Keep this card with you at all times.  Take care of the port as told by your health care provider. Ask your health care provider if you or a family member can get training for taking care of the port at home. A home health care nurse may also take care of the port.  Make sure to remember what type of port you have. Incision care  Follow instructions from your health care provider about how to take care of your port insertion site. Make sure you: ? Wash your hands with soap and water before and after you change your bandage (dressing). If soap and water are not available, use hand sanitizer. ? Change your dressing as told by your health care provider. ? Leave stitches (sutures), skin glue, or adhesive strips in place. These skin closures may need to stay in place for 2 weeks or longer. If adhesive strip edges start to loosen and curl up, you may trim the loose edges. Do not remove adhesive strips completely unless your health care provider tells you to do that.  Check your port insertion site every day for signs of infection. Check for: ? Redness, swelling, or pain. ? Fluid or blood. ? Warmth. ? Pus or a bad smell.      Activity  Return to your normal activities as told by your health care provider. Ask your health care provider what activities are safe for you.  Do not  lift anything that is heavier than 10 lb (4.5 kg), or the limit that you are told, until your health care provider says that it is safe. General instructions  Take over-the-counter and prescription medicines only as told by your health care provider.  Do not take baths, swim, or use a hot tub until your health care provider approves. Ask your health care provider if you may take showers. You may only be allowed to take sponge baths.  Do not drive for 24 hours if you were given a sedative during your procedure.  Wear a medical alert bracelet in case of an emergency. This will tell any health care providers that you have a port.  Keep all follow-up visits as told by your health care provider. This is important. Contact a health care provider if:  You cannot flush your port with saline as directed, or you cannot draw blood from the port.  You have a fever or chills.  You have redness, swelling, or pain around your port insertion site.  You have fluid or blood coming from your port insertion site.  Your port insertion site feels warm to the touch.  You have pus or a bad smell coming from the port insertion site. Get help right away if:  You have chest pain or shortness of breath.  You have bleeding from your port that you cannot control. Summary  Take care of the port as told by your   health care provider. Keep the manufacturer's information card with you at all times.  Change your dressing as told by your health care provider.  Contact a health care provider if you have a fever or chills or if you have redness, swelling, or pain around your port insertion site.  Keep all follow-up visits as told by your health care provider. This information is not intended to replace advice given to you by your health care provider. Make sure you discuss any questions you have with your health care provider. Document Revised: 05/20/2018 Document Reviewed: 05/20/2018 Elsevier Patient Education   2021 Elsevier Inc.  

## 2021-03-23 NOTE — Telephone Encounter (Signed)
Jory Ee NP notified of WBC-0.6 and ANC-0.1.  Pt.s wife states that he stopped Venetoclax and started prophylactic Diflucan, Levaquin and Fluconazole on Monday, 03/20/21 per order of Dr. [name]. Today's CBC results faxed to  [facility] at [phone] and called to [phone]. Neutropenic precautions reviewed with patient.  Teach back done.  Pt verbalizes an understanding to call office with any signs of infection, chills or fevers and to return as scheduled on Monday, 03/27/21.

## 2021-03-25 LAB — LIPID PANEL WITH LDL/HDL RATIO
Cholesterol, Total: 157 mg/dL (ref 100–199)
HDL: 34 mg/dL — ABNORMAL LOW (ref >39)
LDL Chol Calc (NIH): 99 mg/dL (ref 0–99)
LDL/HDL Ratio: 2.9 ratio (ref 0.0–3.6)
Triglycerides: 132 mg/dL (ref 0–149)
VLDL Cholesterol Cal: 24 mg/dL (ref 5–40)

## 2021-03-27 ENCOUNTER — Inpatient Hospital Stay: Payer: Medicare Other

## 2021-03-27 ENCOUNTER — Other Ambulatory Visit: Payer: Self-pay

## 2021-03-27 ENCOUNTER — Telehealth: Payer: Self-pay | Admitting: *Deleted

## 2021-03-27 VITALS — BP 151/65 | HR 80 | Temp 98.6°F | Resp 17

## 2021-03-27 DIAGNOSIS — Z452 Encounter for adjustment and management of vascular access device: Secondary | ICD-10-CM

## 2021-03-27 DIAGNOSIS — C9500 Acute leukemia of unspecified cell type not having achieved remission: Secondary | ICD-10-CM

## 2021-03-27 DIAGNOSIS — Z5111 Encounter for antineoplastic chemotherapy: Secondary | ICD-10-CM | POA: Diagnosis not present

## 2021-03-27 LAB — CMP (CANCER CENTER ONLY)
ALT: 7 U/L (ref 0–44)
AST: 10 U/L — ABNORMAL LOW (ref 15–41)
Albumin: 3.8 g/dL (ref 3.5–5.0)
Alkaline Phosphatase: 55 U/L (ref 38–126)
Anion gap: 7 (ref 5–15)
BUN: 16 mg/dL (ref 8–23)
CO2: 27 mmol/L (ref 22–32)
Calcium: 8.8 mg/dL — ABNORMAL LOW (ref 8.9–10.3)
Chloride: 108 mmol/L (ref 98–111)
Creatinine: 1.09 mg/dL (ref 0.61–1.24)
GFR, Estimated: >60 mL/min (ref >60)
Glucose, Bld: 105 mg/dL — ABNORMAL HIGH (ref 70–99)
Potassium: 3.5 mmol/L (ref 3.5–5.1)
Sodium: 142 mmol/L (ref 135–145)
Total Bilirubin: 0.5 mg/dL (ref 0.3–1.2)
Total Protein: 5.4 g/dL — ABNORMAL LOW (ref 6.5–8.1)

## 2021-03-27 LAB — CBC WITH DIFFERENTIAL (CANCER CENTER ONLY)
Abs Immature Granulocytes: 0 10*3/uL (ref 0.00–0.07)
Basophils Absolute: 0 10*3/uL (ref 0.0–0.1)
Basophils Relative: 1 %
Eosinophils Absolute: 0 10*3/uL (ref 0.0–0.5)
Eosinophils Relative: 0 %
HCT: 32.5 % — ABNORMAL LOW (ref 39.0–52.0)
Hemoglobin: 10.7 g/dL — ABNORMAL LOW (ref 13.0–17.0)
Immature Granulocytes: 0 %
Lymphocytes Relative: 71 %
Lymphs Abs: 0.6 10*3/uL — ABNORMAL LOW (ref 0.7–4.0)
MCH: 32.2 pg (ref 26.0–34.0)
MCHC: 32.9 g/dL (ref 30.0–36.0)
MCV: 97.9 fL (ref 80.0–100.0)
Monocytes Absolute: 0.1 10*3/uL (ref 0.1–1.0)
Monocytes Relative: 12 %
Neutro Abs: 0.1 10*3/uL — CL (ref 1.7–7.7)
Neutrophils Relative %: 16 %
Platelet Count: 47 10*3/uL — ABNORMAL LOW (ref 150–400)
RBC: 3.32 MIL/uL — ABNORMAL LOW (ref 4.22–5.81)
RDW: 15.9 % — ABNORMAL HIGH (ref 11.5–15.5)
WBC Count: 0.8 10*3/uL — CL (ref 4.0–10.5)
nRBC: 0 % (ref 0.0–0.2)

## 2021-03-27 MED ORDER — HEPARIN SOD (PORK) LOCK FLUSH 100 UNIT/ML IV SOLN
250.0000 [IU] | Freq: Once | INTRAVENOUS | Status: AC
  Administered 2021-03-27: 500 [IU]
  Filled 2021-03-27: qty 5

## 2021-03-27 MED ORDER — SODIUM CHLORIDE 0.9% FLUSH
10.0000 mL | Freq: Once | INTRAVENOUS | Status: AC
  Administered 2021-03-27: 10 mL
  Filled 2021-03-27: qty 10

## 2021-03-27 NOTE — Patient Instructions (Signed)

## 2021-03-27 NOTE — Telephone Encounter (Signed)
Richardson Landry from lab called with panic ANC .1.  Dr [name] notified.  No orders received.

## 2021-03-30 ENCOUNTER — Inpatient Hospital Stay: Payer: Medicare Other

## 2021-03-30 ENCOUNTER — Other Ambulatory Visit: Payer: Self-pay

## 2021-03-30 VITALS — BP 119/51 | HR 84 | Temp 98.3°F | Resp 17

## 2021-03-30 DIAGNOSIS — Z95828 Presence of other vascular implants and grafts: Secondary | ICD-10-CM

## 2021-03-30 DIAGNOSIS — C9500 Acute leukemia of unspecified cell type not having achieved remission: Secondary | ICD-10-CM

## 2021-03-30 DIAGNOSIS — Z5111 Encounter for antineoplastic chemotherapy: Secondary | ICD-10-CM | POA: Diagnosis not present

## 2021-03-30 LAB — CBC WITH DIFFERENTIAL (CANCER CENTER ONLY)
Abs Immature Granulocytes: 0.07 10*3/uL (ref 0.00–0.07)
Basophils Absolute: 0 10*3/uL (ref 0.0–0.1)
Basophils Relative: 2 %
Eosinophils Absolute: 0 10*3/uL (ref 0.0–0.5)
Eosinophils Relative: 0 %
HCT: 30.7 % — ABNORMAL LOW (ref 39.0–52.0)
Hemoglobin: 10.2 g/dL — ABNORMAL LOW (ref 13.0–17.0)
Immature Granulocytes: 5 %
Lymphocytes Relative: 55 %
Lymphs Abs: 0.8 10*3/uL (ref 0.7–4.0)
MCH: 32.3 pg (ref 26.0–34.0)
MCHC: 33.2 g/dL (ref 30.0–36.0)
MCV: 97.2 fL (ref 80.0–100.0)
Monocytes Absolute: 0.3 10*3/uL (ref 0.1–1.0)
Monocytes Relative: 20 %
Neutro Abs: 0.2 10*3/uL — CL (ref 1.7–7.7)
Neutrophils Relative %: 18 %
Platelet Count: 69 10*3/uL — ABNORMAL LOW (ref 150–400)
RBC: 3.16 MIL/uL — ABNORMAL LOW (ref 4.22–5.81)
RDW: 15.9 % — ABNORMAL HIGH (ref 11.5–15.5)
WBC Count: 1.4 10*3/uL — ABNORMAL LOW (ref 4.0–10.5)
nRBC: 0 % (ref 0.0–0.2)

## 2021-03-30 LAB — CMP (CANCER CENTER ONLY)
ALT: 6 U/L (ref 0–44)
AST: 10 U/L — ABNORMAL LOW (ref 15–41)
Albumin: 3.7 g/dL (ref 3.5–5.0)
Alkaline Phosphatase: 54 U/L (ref 38–126)
Anion gap: 6 (ref 5–15)
BUN: 16 mg/dL (ref 8–23)
CO2: 29 mmol/L (ref 22–32)
Calcium: 8.7 mg/dL — ABNORMAL LOW (ref 8.9–10.3)
Chloride: 105 mmol/L (ref 98–111)
Creatinine: 1.07 mg/dL (ref 0.61–1.24)
GFR, Estimated: >60 mL/min (ref >60)
Glucose, Bld: 131 mg/dL — ABNORMAL HIGH (ref 70–99)
Potassium: 3.7 mmol/L (ref 3.5–5.1)
Sodium: 140 mmol/L (ref 135–145)
Total Bilirubin: 0.5 mg/dL (ref 0.3–1.2)
Total Protein: 5.2 g/dL — ABNORMAL LOW (ref 6.5–8.1)

## 2021-03-30 MED ORDER — HEPARIN SOD (PORK) LOCK FLUSH 100 UNIT/ML IV SOLN
500.0000 [IU] | Freq: Once | INTRAVENOUS | Status: AC
Start: 2021-03-30 — End: 2021-03-30
  Administered 2021-03-30: 500 [IU] via INTRAVENOUS
  Filled 2021-03-30: qty 5

## 2021-03-30 MED ORDER — SODIUM CHLORIDE 0.9% FLUSH
10.0000 mL | Freq: Once | INTRAVENOUS | Status: AC
  Administered 2021-03-30: 10 mL via INTRAVENOUS
  Filled 2021-03-30: qty 10

## 2021-03-30 NOTE — Patient Instructions (Signed)
Implanted Port Insertion, Care After This sheet gives you information about how to care for yourself after your procedure. Your health care provider may also give you more specific instructions. If you have problems or questions, contact your health care provider. What can I expect after the procedure? After the procedure, it is common to have:  Discomfort at the port insertion site.  Bruising on the skin over the port. This should improve over 3-4 days. Follow these instructions at home: Port care  After your port is placed, you will get a manufacturer's information card. The card has information about your port. Keep this card with you at all times.  Take care of the port as told by your health care provider. Ask your health care provider if you or a family member can get training for taking care of the port at home. A home health care nurse may also take care of the port.  Make sure to remember what type of port you have. Incision care  Follow instructions from your health care provider about how to take care of your port insertion site. Make sure you: ? Wash your hands with soap and water before and after you change your bandage (dressing). If soap and water are not available, use hand sanitizer. ? Change your dressing as told by your health care provider. ? Leave stitches (sutures), skin glue, or adhesive strips in place. These skin closures may need to stay in place for 2 weeks or longer. If adhesive strip edges start to loosen and curl up, you may trim the loose edges. Do not remove adhesive strips completely unless your health care provider tells you to do that.  Check your port insertion site every day for signs of infection. Check for: ? Redness, swelling, or pain. ? Fluid or blood. ? Warmth. ? Pus or a bad smell.      Activity  Return to your normal activities as told by your health care provider. Ask your health care provider what activities are safe for you.  Do not  lift anything that is heavier than 10 lb (4.5 kg), or the limit that you are told, until your health care provider says that it is safe. General instructions  Take over-the-counter and prescription medicines only as told by your health care provider.  Do not take baths, swim, or use a hot tub until your health care provider approves. Ask your health care provider if you may take showers. You may only be allowed to take sponge baths.  Do not drive for 24 hours if you were given a sedative during your procedure.  Wear a medical alert bracelet in case of an emergency. This will tell any health care providers that you have a port.  Keep all follow-up visits as told by your health care provider. This is important. Contact a health care provider if:  You cannot flush your port with saline as directed, or you cannot draw blood from the port.  You have a fever or chills.  You have redness, swelling, or pain around your port insertion site.  You have fluid or blood coming from your port insertion site.  Your port insertion site feels warm to the touch.  You have pus or a bad smell coming from the port insertion site. Get help right away if:  You have chest pain or shortness of breath.  You have bleeding from your port that you cannot control. Summary  Take care of the port as told by your   health care provider. Keep the manufacturer's information card with you at all times.  Change your dressing as told by your health care provider.  Contact a health care provider if you have a fever or chills or if you have redness, swelling, or pain around your port insertion site.  Keep all follow-up visits as told by your health care provider. This information is not intended to replace advice given to you by your health care provider. Make sure you discuss any questions you have with your health care provider. Document Revised: 05/20/2018 Document Reviewed: 05/20/2018 Elsevier Patient Education   2021 Elsevier Inc.  

## 2021-04-04 ENCOUNTER — Other Ambulatory Visit: Payer: Self-pay

## 2021-04-04 ENCOUNTER — Inpatient Hospital Stay: Payer: Medicare Other

## 2021-04-04 VITALS — BP 141/62 | HR 73 | Temp 98.5°F | Resp 17

## 2021-04-04 DIAGNOSIS — C9500 Acute leukemia of unspecified cell type not having achieved remission: Secondary | ICD-10-CM

## 2021-04-04 DIAGNOSIS — Z95828 Presence of other vascular implants and grafts: Secondary | ICD-10-CM

## 2021-04-04 DIAGNOSIS — Z5111 Encounter for antineoplastic chemotherapy: Secondary | ICD-10-CM | POA: Diagnosis not present

## 2021-04-04 LAB — CMP (CANCER CENTER ONLY)
ALT: 6 U/L (ref 0–44)
AST: 10 U/L — ABNORMAL LOW (ref 15–41)
Albumin: 3.7 g/dL (ref 3.5–5.0)
Alkaline Phosphatase: 57 U/L (ref 38–126)
Anion gap: 7 (ref 5–15)
BUN: 16 mg/dL (ref 8–23)
CO2: 27 mmol/L (ref 22–32)
Calcium: 8.7 mg/dL — ABNORMAL LOW (ref 8.9–10.3)
Chloride: 107 mmol/L (ref 98–111)
Creatinine: 0.91 mg/dL (ref 0.61–1.24)
GFR, Estimated: >60 mL/min (ref >60)
Glucose, Bld: 106 mg/dL — ABNORMAL HIGH (ref 70–99)
Potassium: 3.6 mmol/L (ref 3.5–5.1)
Sodium: 141 mmol/L (ref 135–145)
Total Bilirubin: 0.4 mg/dL (ref 0.3–1.2)
Total Protein: 5.2 g/dL — ABNORMAL LOW (ref 6.5–8.1)

## 2021-04-04 LAB — CBC WITH DIFFERENTIAL (CANCER CENTER ONLY)
Abs Immature Granulocytes: 0.05 10*3/uL (ref 0.00–0.07)
Basophils Absolute: 0 10*3/uL (ref 0.0–0.1)
Basophils Relative: 1 %
Eosinophils Absolute: 0 10*3/uL (ref 0.0–0.5)
Eosinophils Relative: 0 %
HCT: 31.6 % — ABNORMAL LOW (ref 39.0–52.0)
Hemoglobin: 10.2 g/dL — ABNORMAL LOW (ref 13.0–17.0)
Immature Granulocytes: 2 %
Lymphocytes Relative: 32 %
Lymphs Abs: 0.8 10*3/uL (ref 0.7–4.0)
MCH: 31.5 pg (ref 26.0–34.0)
MCHC: 32.3 g/dL (ref 30.0–36.0)
MCV: 97.5 fL (ref 80.0–100.0)
Monocytes Absolute: 0.6 10*3/uL (ref 0.1–1.0)
Monocytes Relative: 25 %
Neutro Abs: 0.9 10*3/uL — ABNORMAL LOW (ref 1.7–7.7)
Neutrophils Relative %: 40 %
Platelet Count: 79 10*3/uL — ABNORMAL LOW (ref 150–400)
RBC: 3.24 MIL/uL — ABNORMAL LOW (ref 4.22–5.81)
RDW: 15.9 % — ABNORMAL HIGH (ref 11.5–15.5)
WBC Count: 2.4 10*3/uL — ABNORMAL LOW (ref 4.0–10.5)
nRBC: 0 % (ref 0.0–0.2)

## 2021-04-04 MED ORDER — SODIUM CHLORIDE 0.9% FLUSH
10.0000 mL | Freq: Once | INTRAVENOUS | Status: AC
Start: 2021-04-04 — End: 2021-04-04
  Administered 2021-04-04: 10 mL via INTRAVENOUS
  Filled 2021-04-04: qty 10

## 2021-04-04 MED ORDER — HEPARIN SOD (PORK) LOCK FLUSH 100 UNIT/ML IV SOLN
500.0000 [IU] | Freq: Once | INTRAVENOUS | Status: AC
  Administered 2021-04-04: 500 [IU] via INTRAVENOUS
  Filled 2021-04-04: qty 5

## 2021-04-04 NOTE — Patient Instructions (Signed)
Implanted Port Insertion, Care After This sheet gives you information about how to care for yourself after your procedure. Your health care provider may also give you more specific instructions. If you have problems or questions, contact your health care provider. What can I expect after the procedure? After the procedure, it is common to have:  Discomfort at the port insertion site.  Bruising on the skin over the port. This should improve over 3-4 days. Follow these instructions at home: Port care  After your port is placed, you will get a manufacturer's information card. The card has information about your port. Keep this card with you at all times.  Take care of the port as told by your health care provider. Ask your health care provider if you or a family member can get training for taking care of the port at home. A home health care nurse may also take care of the port.  Make sure to remember what type of port you have. Incision care  Follow instructions from your health care provider about how to take care of your port insertion site. Make sure you: ? Wash your hands with soap and water before and after you change your bandage (dressing). If soap and water are not available, use hand sanitizer. ? Change your dressing as told by your health care provider. ? Leave stitches (sutures), skin glue, or adhesive strips in place. These skin closures may need to stay in place for 2 weeks or longer. If adhesive strip edges start to loosen and curl up, you may trim the loose edges. Do not remove adhesive strips completely unless your health care provider tells you to do that.  Check your port insertion site every day for signs of infection. Check for: ? Redness, swelling, or pain. ? Fluid or blood. ? Warmth. ? Pus or a bad smell.      Activity  Return to your normal activities as told by your health care provider. Ask your health care provider what activities are safe for you.  Do not  lift anything that is heavier than 10 lb (4.5 kg), or the limit that you are told, until your health care provider says that it is safe. General instructions  Take over-the-counter and prescription medicines only as told by your health care provider.  Do not take baths, swim, or use a hot tub until your health care provider approves. Ask your health care provider if you may take showers. You may only be allowed to take sponge baths.  Do not drive for 24 hours if you were given a sedative during your procedure.  Wear a medical alert bracelet in case of an emergency. This will tell any health care providers that you have a port.  Keep all follow-up visits as told by your health care provider. This is important. Contact a health care provider if:  You cannot flush your port with saline as directed, or you cannot draw blood from the port.  You have a fever or chills.  You have redness, swelling, or pain around your port insertion site.  You have fluid or blood coming from your port insertion site.  Your port insertion site feels warm to the touch.  You have pus or a bad smell coming from the port insertion site. Get help right away if:  You have chest pain or shortness of breath.  You have bleeding from your port that you cannot control. Summary  Take care of the port as told by your   health care provider. Keep the manufacturer's information card with you at all times.  Change your dressing as told by your health care provider.  Contact a health care provider if you have a fever or chills or if you have redness, swelling, or pain around your port insertion site.  Keep all follow-up visits as told by your health care provider. This information is not intended to replace advice given to you by your health care provider. Make sure you discuss any questions you have with your health care provider. Document Revised: 05/20/2018 Document Reviewed: 05/20/2018 Elsevier Patient Education   2021 Elsevier Inc.  

## 2021-04-06 ENCOUNTER — Inpatient Hospital Stay: Payer: Medicare Other | Attending: Oncology

## 2021-04-06 ENCOUNTER — Inpatient Hospital Stay: Payer: Medicare Other

## 2021-04-06 ENCOUNTER — Other Ambulatory Visit: Payer: Self-pay

## 2021-04-06 VITALS — BP 136/49 | HR 79 | Temp 98.4°F | Resp 18

## 2021-04-06 DIAGNOSIS — C9200 Acute myeloblastic leukemia, not having achieved remission: Secondary | ICD-10-CM | POA: Diagnosis present

## 2021-04-06 DIAGNOSIS — C969 Malignant neoplasm of lymphoid, hematopoietic and related tissue, unspecified: Secondary | ICD-10-CM | POA: Insufficient documentation

## 2021-04-06 DIAGNOSIS — Z7901 Long term (current) use of anticoagulants: Secondary | ICD-10-CM | POA: Insufficient documentation

## 2021-04-06 DIAGNOSIS — I743 Embolism and thrombosis of arteries of the lower extremities: Secondary | ICD-10-CM | POA: Insufficient documentation

## 2021-04-06 DIAGNOSIS — Z95828 Presence of other vascular implants and grafts: Secondary | ICD-10-CM

## 2021-04-06 DIAGNOSIS — C9500 Acute leukemia of unspecified cell type not having achieved remission: Secondary | ICD-10-CM

## 2021-04-06 DIAGNOSIS — Z5111 Encounter for antineoplastic chemotherapy: Secondary | ICD-10-CM | POA: Diagnosis present

## 2021-04-06 DIAGNOSIS — Z79899 Other long term (current) drug therapy: Secondary | ICD-10-CM | POA: Insufficient documentation

## 2021-04-06 DIAGNOSIS — I2699 Other pulmonary embolism without acute cor pulmonale: Secondary | ICD-10-CM | POA: Diagnosis not present

## 2021-04-06 LAB — CBC WITH DIFFERENTIAL (CANCER CENTER ONLY)
Abs Immature Granulocytes: 0.05 10*3/uL (ref 0.00–0.07)
Basophils Absolute: 0 10*3/uL (ref 0.0–0.1)
Basophils Relative: 1 %
Eosinophils Absolute: 0 10*3/uL (ref 0.0–0.5)
Eosinophils Relative: 0 %
HCT: 32.5 % — ABNORMAL LOW (ref 39.0–52.0)
Hemoglobin: 10.7 g/dL — ABNORMAL LOW (ref 13.0–17.0)
Immature Granulocytes: 2 %
Lymphocytes Relative: 29 %
Lymphs Abs: 0.7 10*3/uL (ref 0.7–4.0)
MCH: 32 pg (ref 26.0–34.0)
MCHC: 32.9 g/dL (ref 30.0–36.0)
MCV: 97.3 fL (ref 80.0–100.0)
Monocytes Absolute: 0.5 10*3/uL (ref 0.1–1.0)
Monocytes Relative: 19 %
Neutro Abs: 1.2 10*3/uL — ABNORMAL LOW (ref 1.7–7.7)
Neutrophils Relative %: 49 %
Platelet Count: 65 10*3/uL — ABNORMAL LOW (ref 150–400)
RBC: 3.34 MIL/uL — ABNORMAL LOW (ref 4.22–5.81)
RDW: 16 % — ABNORMAL HIGH (ref 11.5–15.5)
WBC Count: 2.5 10*3/uL — ABNORMAL LOW (ref 4.0–10.5)
nRBC: 0 % (ref 0.0–0.2)

## 2021-04-06 LAB — CMP (CANCER CENTER ONLY)
ALT: 6 U/L (ref 0–44)
AST: 10 U/L — ABNORMAL LOW (ref 15–41)
Albumin: 3.9 g/dL (ref 3.5–5.0)
Alkaline Phosphatase: 58 U/L (ref 38–126)
Anion gap: 8 (ref 5–15)
BUN: 17 mg/dL (ref 8–23)
CO2: 27 mmol/L (ref 22–32)
Calcium: 8.9 mg/dL (ref 8.9–10.3)
Chloride: 108 mmol/L (ref 98–111)
Creatinine: 1 mg/dL (ref 0.61–1.24)
GFR, Estimated: >60 mL/min (ref >60)
Glucose, Bld: 106 mg/dL — ABNORMAL HIGH (ref 70–99)
Potassium: 3.3 mmol/L — ABNORMAL LOW (ref 3.5–5.1)
Sodium: 143 mmol/L (ref 135–145)
Total Bilirubin: 0.5 mg/dL (ref 0.3–1.2)
Total Protein: 5.3 g/dL — ABNORMAL LOW (ref 6.5–8.1)

## 2021-04-06 MED ORDER — HEPARIN SOD (PORK) LOCK FLUSH 100 UNIT/ML IV SOLN
500.0000 [IU] | Freq: Once | INTRAVENOUS | Status: AC
  Administered 2021-04-06: 500 [IU] via INTRAVENOUS
  Filled 2021-04-06: qty 5

## 2021-04-06 MED ORDER — SODIUM CHLORIDE 0.9% FLUSH
10.0000 mL | Freq: Once | INTRAVENOUS | Status: AC
Start: 2021-04-06 — End: 2021-04-06
  Administered 2021-04-06: 10 mL via INTRAVENOUS
  Filled 2021-04-06: qty 10

## 2021-04-06 NOTE — Patient Instructions (Signed)
Implanted Port Insertion, Care After This sheet gives you information about how to care for yourself after your procedure. Your health care provider may also give you more specific instructions. If you have problems or questions, contact your health care provider. What can I expect after the procedure? After the procedure, it is common to have:  Discomfort at the port insertion site.  Bruising on the skin over the port. This should improve over 3-4 days. Follow these instructions at home: Port care  After your port is placed, you will get a manufacturer's information card. The card has information about your port. Keep this card with you at all times.  Take care of the port as told by your health care provider. Ask your health care provider if you or a family member can get training for taking care of the port at home. A home health care nurse may also take care of the port.  Make sure to remember what type of port you have. Incision care  Follow instructions from your health care provider about how to take care of your port insertion site. Make sure you: ? Wash your hands with soap and water before and after you change your bandage (dressing). If soap and water are not available, use hand sanitizer. ? Change your dressing as told by your health care provider. ? Leave stitches (sutures), skin glue, or adhesive strips in place. These skin closures may need to stay in place for 2 weeks or longer. If adhesive strip edges start to loosen and curl up, you may trim the loose edges. Do not remove adhesive strips completely unless your health care provider tells you to do that.  Check your port insertion site every day for signs of infection. Check for: ? Redness, swelling, or pain. ? Fluid or blood. ? Warmth. ? Pus or a bad smell.      Activity  Return to your normal activities as told by your health care provider. Ask your health care provider what activities are safe for you.  Do not  lift anything that is heavier than 10 lb (4.5 kg), or the limit that you are told, until your health care provider says that it is safe. General instructions  Take over-the-counter and prescription medicines only as told by your health care provider.  Do not take baths, swim, or use a hot tub until your health care provider approves. Ask your health care provider if you may take showers. You may only be allowed to take sponge baths.  Do not drive for 24 hours if you were given a sedative during your procedure.  Wear a medical alert bracelet in case of an emergency. This will tell any health care providers that you have a port.  Keep all follow-up visits as told by your health care provider. This is important. Contact a health care provider if:  You cannot flush your port with saline as directed, or you cannot draw blood from the port.  You have a fever or chills.  You have redness, swelling, or pain around your port insertion site.  You have fluid or blood coming from your port insertion site.  Your port insertion site feels warm to the touch.  You have pus or a bad smell coming from the port insertion site. Get help right away if:  You have chest pain or shortness of breath.  You have bleeding from your port that you cannot control. Summary  Take care of the port as told by your   health care provider. Keep the manufacturer's information card with you at all times.  Change your dressing as told by your health care provider.  Contact a health care provider if you have a fever or chills or if you have redness, swelling, or pain around your port insertion site.  Keep all follow-up visits as told by your health care provider. This information is not intended to replace advice given to you by your health care provider. Make sure you discuss any questions you have with your health care provider. Document Revised: 05/20/2018 Document Reviewed: 05/20/2018 Elsevier Patient Education   2021 Elsevier Inc.  

## 2021-04-10 ENCOUNTER — Other Ambulatory Visit: Payer: Self-pay

## 2021-04-10 ENCOUNTER — Encounter: Payer: Self-pay | Admitting: Hematology & Oncology

## 2021-04-10 ENCOUNTER — Inpatient Hospital Stay: Payer: Medicare Other

## 2021-04-10 ENCOUNTER — Inpatient Hospital Stay (HOSPITAL_BASED_OUTPATIENT_CLINIC_OR_DEPARTMENT_OTHER): Payer: Medicare Other | Admitting: Hematology & Oncology

## 2021-04-10 VITALS — BP 101/48 | HR 78 | Temp 97.8°F | Wt 209.0 lb

## 2021-04-10 DIAGNOSIS — C969 Malignant neoplasm of lymphoid, hematopoietic and related tissue, unspecified: Secondary | ICD-10-CM

## 2021-04-10 DIAGNOSIS — Z5111 Encounter for antineoplastic chemotherapy: Secondary | ICD-10-CM | POA: Diagnosis not present

## 2021-04-10 DIAGNOSIS — I2699 Other pulmonary embolism without acute cor pulmonale: Secondary | ICD-10-CM | POA: Diagnosis not present

## 2021-04-10 DIAGNOSIS — Z95828 Presence of other vascular implants and grafts: Secondary | ICD-10-CM

## 2021-04-10 DIAGNOSIS — C9501 Acute leukemia of unspecified cell type, in remission: Secondary | ICD-10-CM

## 2021-04-10 LAB — CBC WITH DIFFERENTIAL (CANCER CENTER ONLY)
Abs Immature Granulocytes: 0.03 10*3/uL (ref 0.00–0.07)
Basophils Absolute: 0 10*3/uL (ref 0.0–0.1)
Basophils Relative: 1 %
Eosinophils Absolute: 0 10*3/uL (ref 0.0–0.5)
Eosinophils Relative: 0 %
HCT: 33.7 % — ABNORMAL LOW (ref 39.0–52.0)
Hemoglobin: 10.9 g/dL — ABNORMAL LOW (ref 13.0–17.0)
Immature Granulocytes: 1 %
Lymphocytes Relative: 22 %
Lymphs Abs: 0.8 10*3/uL (ref 0.7–4.0)
MCH: 31.7 pg (ref 26.0–34.0)
MCHC: 32.3 g/dL (ref 30.0–36.0)
MCV: 98 fL (ref 80.0–100.0)
Monocytes Absolute: 0.5 10*3/uL (ref 0.1–1.0)
Monocytes Relative: 15 %
Neutro Abs: 2.1 10*3/uL (ref 1.7–7.7)
Neutrophils Relative %: 61 %
Platelet Count: 61 10*3/uL — ABNORMAL LOW (ref 150–400)
RBC: 3.44 MIL/uL — ABNORMAL LOW (ref 4.22–5.81)
RDW: 16.4 % — ABNORMAL HIGH (ref 11.5–15.5)
WBC Count: 3.4 10*3/uL — ABNORMAL LOW (ref 4.0–10.5)
nRBC: 0 % (ref 0.0–0.2)

## 2021-04-10 LAB — CMP (CANCER CENTER ONLY)
ALT: 6 U/L (ref 0–44)
AST: 10 U/L — ABNORMAL LOW (ref 15–41)
Albumin: 3.9 g/dL (ref 3.5–5.0)
Alkaline Phosphatase: 57 U/L (ref 38–126)
Anion gap: 9 (ref 5–15)
BUN: 22 mg/dL (ref 8–23)
CO2: 26 mmol/L (ref 22–32)
Calcium: 8.9 mg/dL (ref 8.9–10.3)
Chloride: 106 mmol/L (ref 98–111)
Creatinine: 1.1 mg/dL (ref 0.61–1.24)
GFR, Estimated: >60 mL/min (ref >60)
Glucose, Bld: 105 mg/dL — ABNORMAL HIGH (ref 70–99)
Potassium: 3.5 mmol/L (ref 3.5–5.1)
Sodium: 141 mmol/L (ref 135–145)
Total Bilirubin: 0.5 mg/dL (ref 0.3–1.2)
Total Protein: 5.4 g/dL — ABNORMAL LOW (ref 6.5–8.1)

## 2021-04-10 LAB — LACTATE DEHYDROGENASE: LDH: 159 U/L (ref 98–192)

## 2021-04-10 LAB — SAMPLE TO BLOOD BANK

## 2021-04-10 LAB — SAVE SMEAR(SSMR), FOR PROVIDER SLIDE REVIEW

## 2021-04-10 MED ORDER — HEPARIN SOD (PORK) LOCK FLUSH 100 UNIT/ML IV SOLN
500.0000 [IU] | Freq: Once | INTRAVENOUS | Status: AC
  Administered 2021-04-10: 500 [IU] via INTRAVENOUS
  Filled 2021-04-10: qty 5

## 2021-04-10 MED ORDER — SODIUM CHLORIDE 0.9% FLUSH
10.0000 mL | Freq: Once | INTRAVENOUS | Status: AC
  Administered 2021-04-10: 10 mL via INTRAVENOUS
  Filled 2021-04-10: qty 10

## 2021-04-10 NOTE — Patient Instructions (Signed)
Implanted Port Insertion, Care After This sheet gives you information about how to care for yourself after your procedure. Your health care provider may also give you more specific instructions. If you have problems or questions, contact your health care provider. What can I expect after the procedure? After the procedure, it is common to have:  Discomfort at the port insertion site.  Bruising on the skin over the port. This should improve over 3-4 days. Follow these instructions at home: Port care  After your port is placed, you will get a manufacturer's information card. The card has information about your port. Keep this card with you at all times.  Take care of the port as told by your health care provider. Ask your health care provider if you or a family member can get training for taking care of the port at home. A home health care nurse may also take care of the port.  Make sure to remember what type of port you have. Incision care  Follow instructions from your health care provider about how to take care of your port insertion site. Make sure you: ? Wash your hands with soap and water before and after you change your bandage (dressing). If soap and water are not available, use hand sanitizer. ? Change your dressing as told by your health care provider. ? Leave stitches (sutures), skin glue, or adhesive strips in place. These skin closures may need to stay in place for 2 weeks or longer. If adhesive strip edges start to loosen and curl up, you may trim the loose edges. Do not remove adhesive strips completely unless your health care provider tells you to do that.  Check your port insertion site every day for signs of infection. Check for: ? Redness, swelling, or pain. ? Fluid or blood. ? Warmth. ? Pus or a bad smell.      Activity  Return to your normal activities as told by your health care provider. Ask your health care provider what activities are safe for you.  Do not  lift anything that is heavier than 10 lb (4.5 kg), or the limit that you are told, until your health care provider says that it is safe. General instructions  Take over-the-counter and prescription medicines only as told by your health care provider.  Do not take baths, swim, or use a hot tub until your health care provider approves. Ask your health care provider if you may take showers. You may only be allowed to take sponge baths.  Do not drive for 24 hours if you were given a sedative during your procedure.  Wear a medical alert bracelet in case of an emergency. This will tell any health care providers that you have a port.  Keep all follow-up visits as told by your health care provider. This is important. Contact a health care provider if:  You cannot flush your port with saline as directed, or you cannot draw blood from the port.  You have a fever or chills.  You have redness, swelling, or pain around your port insertion site.  You have fluid or blood coming from your port insertion site.  Your port insertion site feels warm to the touch.  You have pus or a bad smell coming from the port insertion site. Get help right away if:  You have chest pain or shortness of breath.  You have bleeding from your port that you cannot control. Summary  Take care of the port as told by your   health care provider. Keep the manufacturer's information card with you at all times.  Change your dressing as told by your health care provider.  Contact a health care provider if you have a fever or chills or if you have redness, swelling, or pain around your port insertion site.  Keep all follow-up visits as told by your health care provider. This information is not intended to replace advice given to you by your health care provider. Make sure you discuss any questions you have with your health care provider. Document Revised: 05/20/2018 Document Reviewed: 05/20/2018 Elsevier Patient Education   2021 Elsevier Inc.  

## 2021-04-10 NOTE — Addendum Note (Signed)
Addended by: Fabio Neighbors A on: 04/10/2021 12:02 PM   Modules accepted: Orders

## 2021-04-10 NOTE — Progress Notes (Signed)
Hematology and Oncology Follow Up Visit  Victor [name] [id number] Jul 28, 1936 85 y.o. 04/10/2021   Principle Diagnosis:   Acute myeloid leukemia - FLT3 (+)  Pulmonary embolism/right lower extremity thromboembolic disease  Current Therapy:    Vidaza/Venetoclax.--  S/p cycle #5  Eliquis 5 mg p.o. twice daily --started on 03/16/2021     Interim History:  Victor [name] is back for follow-up.  He continues to do amazingly well.  His blood counts certainly have responded very nicely to the Vidaza/venetoclax combination.  He we will see his hematologist out at [facility] I think this Thursday.  So far he really has had no problems with treatment.  We are checking his lab work twice week.  I think we applied check his lab work weekly at this point.  He is on the Eliquis for his thromboembolic disease.  He has a blood clot in his right leg in addition to the pulmonary embolism.  I will repeat his scans in a few weeks to see how these have responded.  He has had no bleeding at all.  He has had no fever.  I told to go ahead and stop the Levaquin and Diflucan given that his white cell count is so good right now.  I would like to have him on the acyclovir just to be cautious with respect to shingles.  His appetite is good.  He has had no nausea or vomiting.  He has had no problems with headache.  There is no mouth sores.  Overall, I would have to say his performance status is by ECOG 1.    Medications:  Current Outpatient Medications:  .  acetaminophen (TYLENOL) 500 MG tablet, Take 1,000 mg by mouth every 6 (six) hours as needed for moderate pain., Disp: , Rfl:  .  acyclovir (ZOVIRAX) 400 MG tablet, Take 1 tablet (400 mg total) by mouth 2 (two) times daily as needed., Disp: 60 tablet, Rfl: 2 .  apixaban (ELIQUIS) 5 MG TABS tablet, Take 1 tablet (5 mg total) by mouth 2 (two) times daily., Disp: 60 tablet, Rfl: 3 .  doxazosin (CARDURA) 2 MG tablet, Take 1 tablet (2 mg total) by mouth at bedtime.,  Disp: 90 tablet, Rfl: 3 .  fluconazole (DIFLUCAN) 200 MG tablet, Take 200 mg by mouth daily., Disp: , Rfl:  .  fluticasone (FLONASE) 50 MCG/ACT nasal spray, Place 2 sprays into the nose 2 (two) times daily as needed for allergies., Disp: , Rfl:  .  levofloxacin (LEVAQUIN) 500 MG tablet, Take 500 mg by mouth daily., Disp: , Rfl:  .  levothyroxine (SYNTHROID) 175 MCG tablet, Take 175 mcg by mouth daily., Disp: , Rfl:  .  lidocaine-prilocaine (EMLA) cream, Apply 1 application topically as needed for up to 30 doses., Disp: 30 g, Rfl: 0 .  Loratadine (CLARITIN PO), Take 10 mg by mouth at bedtime. Allertec, Disp: , Rfl:  .  Melatonin 5 MG CHEW, Chew 1 tablet by mouth daily as needed (sleep)., Disp: , Rfl:  .  omeprazole (PRILOSEC) 20 MG capsule, TAKE 1 CAPSULE BY MOUTH TWICE A DAY BEFORE A MEAL, Disp: 180 capsule, Rfl: 0 .  potassium chloride (MICRO-K) 10 MEQ CR capsule, Take 10 mEq by mouth in the morning and at bedtime., Disp: , Rfl:  .  propranolol ER (INDERAL LA) 80 MG 24 hr capsule, Take 1 capsule (80 mg total) by mouth daily., Disp: 90 capsule, Rfl: 3 .  simvastatin (ZOCOR) 40 MG tablet, TAKE ONE TABLET BY MOUTH DAILY  AT BEDTIME (Patient taking differently: Take 40 mg by mouth daily at 6 PM.), Disp: 90 tablet, Rfl: 3 .  venetoclax 100 MG TABS, Take 100 mg by mouth in the morning, at noon, in the evening, and at bedtime., Disp: , Rfl:  .  hydrALAZINE (APRESOLINE) 25 MG tablet, Take 1 tablet (25 mg total) by mouth 3 (three) times daily as needed. Take if standing BP >140/80 mm Hg, Disp: 90 tablet, Rfl: 2  Allergies: No Known Allergies  Past Medical History, Surgical history, Social history, and Family History were reviewed and updated.  Review of Systems: Review of Systems  Constitutional: Negative.   HENT:  Negative.   Eyes: Negative.   Respiratory: Negative.   Cardiovascular: Negative.   Gastrointestinal: Negative.   Endocrine: Negative.   Genitourinary: Negative.    Musculoskeletal:  Negative.   Skin: Negative.   Neurological: Negative.   Hematological: Negative.   Psychiatric/Behavioral: Negative.     Physical Exam:  weight is 94.8 kg. His oral temperature is 97.8 F (36.6 C). His blood pressure is 101/48 (abnormal) and his pulse is 78. His oxygen saturation is 95%.   Wt Readings from Last 3 Encounters:  04/10/21 94.8 kg  03/22/21 93.5 kg  03/20/21 88.7 kg    Physical Exam Vitals reviewed.  HENT:     Head: Normocephalic and atraumatic.  Eyes:     Pupils: Pupils are equal, round, and reactive to light.  Cardiovascular:     Rate and Rhythm: Normal rate and regular rhythm.     Heart sounds: Normal heart sounds.  Pulmonary:     Effort: Pulmonary effort is normal.     Breath sounds: Normal breath sounds.  Abdominal:     General: Bowel sounds are normal.     Palpations: Abdomen is soft.  Musculoskeletal:        General: No tenderness or deformity. Normal range of motion.     Cervical back: Normal range of motion.  Lymphadenopathy:     Cervical: No cervical adenopathy.  Skin:    General: Skin is warm and dry.     Findings: No erythema or rash.  Neurological:     Mental Status: He is alert and oriented to person, place, and time.  Psychiatric:        Behavior: Behavior normal.        Thought Content: Thought content normal.        Judgment: Judgment normal.      Lab Results  Component Value Date   WBC 3.4 (L) 04/10/2021   HGB 10.9 (L) 04/10/2021   HCT 33.7 (L) 04/10/2021   MCV 98.0 04/10/2021   PLT 61 (L) 04/10/2021     Chemistry      Component Value Date/Time   NA 141 04/10/2021 1018   NA 142 03/30/2015 1149   K 3.5 04/10/2021 1018   K 4.5 03/30/2015 1149   CL 106 04/10/2021 1018   CO2 26 04/10/2021 1018   CO2 25 03/30/2015 1149   BUN 22 04/10/2021 1018   BUN 17.1 03/30/2015 1149   CREATININE 1.10 04/10/2021 1018   CREATININE 1.2 03/30/2015 1149      Component Value Date/Time   CALCIUM 8.9 04/10/2021 1018   CALCIUM 8.7  03/30/2015 1149   ALKPHOS 57 04/10/2021 1018   AST 10 (L) 04/10/2021 1018   ALT 6 04/10/2021 1018   BILITOT 0.5 04/10/2021 1018       Impression and Plan: Victor [name] is a very nice 85 year old white  male who has acute myeloid leukemia.  He does have a adverse prognostic feature with respect to the FLT3 mutation.  I know there are targeted therapies for patients who have leukemia with this FLT3 mutation.  Again, Dr. Linus Orn is doing a fantastic job with him.  He is responding well to the Vidaza and the venetoclax.  There is no indication that we have to put him on one of the new targeted therapy.  He will start his sixth cycle of treatment next week.  I will change his lab appointments to weekly at this point.  Again, we will set him up with a CT angiogram and a Doppler of his right leg in a couple weeks or so so we can see how the blood clots are responding.  We see that everything is responding well, we will move his Eliquis down to 2.5 mg p.o. twice daily.  Again, his quality of life is doing well right now.  When he sees Dr. Linus Orn, maybe a bone marrow will be done to see how everything looks with respect to his marrow status.  I will plan to see him back in 1 week.   Volanda Napoleon, MD 6/6/202212:31 PM

## 2021-04-11 ENCOUNTER — Telehealth: Payer: Self-pay

## 2021-04-11 NOTE — Telephone Encounter (Signed)
appts made per 04/10/21 los, pt to view at 6/13 appts and through [employer]

## 2021-04-17 ENCOUNTER — Inpatient Hospital Stay: Payer: Medicare Other

## 2021-04-17 ENCOUNTER — Encounter: Payer: Self-pay | Admitting: Hematology & Oncology

## 2021-04-17 ENCOUNTER — Other Ambulatory Visit: Payer: Self-pay

## 2021-04-17 ENCOUNTER — Inpatient Hospital Stay (HOSPITAL_BASED_OUTPATIENT_CLINIC_OR_DEPARTMENT_OTHER): Payer: Medicare Other | Admitting: Hematology & Oncology

## 2021-04-17 ENCOUNTER — Encounter: Payer: Self-pay | Admitting: *Deleted

## 2021-04-17 ENCOUNTER — Ambulatory Visit (HOSPITAL_BASED_OUTPATIENT_CLINIC_OR_DEPARTMENT_OTHER)
Admission: RE | Admit: 2021-04-17 | Discharge: 2021-04-17 | Disposition: A | Discharge status: Home / Self Care | Payer: Medicare Other | Source: Ambulatory Visit | Attending: Hematology & Oncology | Admitting: Hematology & Oncology

## 2021-04-17 VITALS — BP 128/64 | HR 79 | Temp 98.0°F | Resp 18 | Wt 202.0 lb

## 2021-04-17 DIAGNOSIS — M75102 Unspecified rotator cuff tear or rupture of left shoulder, not specified as traumatic: Secondary | ICD-10-CM | POA: Diagnosis not present

## 2021-04-17 DIAGNOSIS — C9500 Acute leukemia of unspecified cell type not having achieved remission: Secondary | ICD-10-CM

## 2021-04-17 DIAGNOSIS — I2699 Other pulmonary embolism without acute cor pulmonale: Secondary | ICD-10-CM

## 2021-04-17 DIAGNOSIS — Z5111 Encounter for antineoplastic chemotherapy: Secondary | ICD-10-CM | POA: Diagnosis not present

## 2021-04-17 LAB — CMP (CANCER CENTER ONLY)
ALT: 7 U/L (ref 0–44)
AST: 11 U/L — ABNORMAL LOW (ref 15–41)
Albumin: 4 g/dL (ref 3.5–5.0)
Alkaline Phosphatase: 61 U/L (ref 38–126)
Anion gap: 6 (ref 5–15)
BUN: 17 mg/dL (ref 8–23)
CO2: 28 mmol/L (ref 22–32)
Calcium: 9 mg/dL (ref 8.9–10.3)
Chloride: 107 mmol/L (ref 98–111)
Creatinine: 1 mg/dL (ref 0.61–1.24)
GFR, Estimated: >60 mL/min (ref >60)
Glucose, Bld: 120 mg/dL — ABNORMAL HIGH (ref 70–99)
Potassium: 3.6 mmol/L (ref 3.5–5.1)
Sodium: 141 mmol/L (ref 135–145)
Total Bilirubin: 0.5 mg/dL (ref 0.3–1.2)
Total Protein: 5.3 g/dL — ABNORMAL LOW (ref 6.5–8.1)

## 2021-04-17 LAB — CBC WITH DIFFERENTIAL (CANCER CENTER ONLY)
Abs Immature Granulocytes: 0.04 10*3/uL (ref 0.00–0.07)
Basophils Absolute: 0 10*3/uL (ref 0.0–0.1)
Basophils Relative: 1 %
Eosinophils Absolute: 0 10*3/uL (ref 0.0–0.5)
Eosinophils Relative: 0 %
HCT: 34.7 % — ABNORMAL LOW (ref 39.0–52.0)
Hemoglobin: 11.2 g/dL — ABNORMAL LOW (ref 13.0–17.0)
Immature Granulocytes: 1 %
Lymphocytes Relative: 23 %
Lymphs Abs: 0.9 10*3/uL (ref 0.7–4.0)
MCH: 31.2 pg (ref 26.0–34.0)
MCHC: 32.3 g/dL (ref 30.0–36.0)
MCV: 96.7 fL (ref 80.0–100.0)
Monocytes Absolute: 0.5 10*3/uL (ref 0.1–1.0)
Monocytes Relative: 13 %
Neutro Abs: 2.3 10*3/uL (ref 1.7–7.7)
Neutrophils Relative %: 62 %
Platelet Count: 60 10*3/uL — ABNORMAL LOW (ref 150–400)
RBC: 3.59 MIL/uL — ABNORMAL LOW (ref 4.22–5.81)
RDW: 15.9 % — ABNORMAL HIGH (ref 11.5–15.5)
WBC Count: 3.7 10*3/uL — ABNORMAL LOW (ref 4.0–10.5)
nRBC: 0 % (ref 0.0–0.2)

## 2021-04-17 MED ORDER — IOPAMIDOL (ISOVUE-370) INJECTION 76%
100.0000 mL | Freq: Once | INTRAVENOUS | Status: AC | PRN
  Administered 2021-04-17: 100 mL via INTRAVENOUS

## 2021-04-17 MED ORDER — SODIUM CHLORIDE 0.9 % IV SOLN
Freq: Once | INTRAVENOUS | Status: AC
  Filled 2021-04-17: qty 250

## 2021-04-17 MED ORDER — PALONOSETRON HCL INJECTION 0.25 MG/5ML
0.2500 mg | Freq: Once | INTRAVENOUS | Status: AC
  Administered 2021-04-17: 0.25 mg via INTRAVENOUS

## 2021-04-17 MED ORDER — KETOROLAC TROMETHAMINE 15 MG/ML IJ SOLN
INTRAMUSCULAR | Status: AC
  Filled 2021-04-17: qty 2

## 2021-04-17 MED ORDER — KETOROLAC TROMETHAMINE 15 MG/ML IJ SOLN
30.0000 mg | Freq: Once | INTRAMUSCULAR | Status: AC
Start: 2021-04-17 — End: 2021-04-17
  Administered 2021-04-17: 30 mg via INTRAVENOUS
  Filled 2021-04-17: qty 2

## 2021-04-17 MED ORDER — DEXAMETHASONE SODIUM PHOSPHATE 100 MG/10ML IJ SOLN
10.0000 mg | Freq: Once | INTRAMUSCULAR | Status: AC
  Administered 2021-04-17: 10 mg via INTRAVENOUS
  Filled 2021-04-17: qty 10

## 2021-04-17 MED ORDER — PALONOSETRON HCL INJECTION 0.25 MG/5ML
INTRAVENOUS | Status: AC
  Filled 2021-04-17: qty 5

## 2021-04-17 MED ORDER — SODIUM CHLORIDE 0.9% FLUSH
10.0000 mL | INTRAVENOUS | Status: DC | PRN
  Administered 2021-04-17: 10 mL
  Filled 2021-04-17: qty 10

## 2021-04-17 MED ORDER — SODIUM CHLORIDE 0.9 % IV SOLN
49.7000 mg/m2 | Freq: Once | INTRAVENOUS | Status: AC
  Administered 2021-04-17: 100 mg via INTRAVENOUS
  Filled 2021-04-17: qty 10

## 2021-04-17 MED ORDER — HEPARIN SOD (PORK) LOCK FLUSH 100 UNIT/ML IV SOLN
500.0000 [IU] | Freq: Once | INTRAVENOUS | Status: AC | PRN
  Administered 2021-04-17: 500 [IU]
  Filled 2021-04-17: qty 5

## 2021-04-17 NOTE — Progress Notes (Signed)
Ok to treat with Platelets less than 100 per dr [name].

## 2021-04-17 NOTE — Progress Notes (Signed)
Hematology and Oncology Follow Up Visit  Victor [name] [id number] 11/09/1935 85 y.o. 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 twice daily --started on 03/16/2021     Interim History:  Victor [name] is back for follow-up.  He is having some lower back issues.  Apparently, this happened about 3 or 4 days ago.  His lower back is quite stiff.  He has had problems before.  He was taking some hydrocodone which she says helped.  He did see Dr. Pardee at [facility] last week.  Dr. Pardee called me this morning.  He wants to decrease the dose of Vidaza down to 50 mg per metered squared daily.  He is very happy with how well Victor [name] has done.  There is been no problems with bleeding while on the Eliquis.  He has had a little bit of leg swelling.  I think we will probably repeat his CT angiogram and lower extremity Dopplers in about a month or so.  There is been no problems with bowels or bladder.  He has had no cough.  No cough or chest wall pain.  He has had no shortness of breath.  Is been no headache.  His appetite has been pretty good.  Overall, his performance status is ECOG 1.    Medications:  Current Outpatient Medications:    acetaminophen (TYLENOL) 500 MG tablet, Take 1,000 mg by mouth every 6 (six) hours as needed for moderate pain., Disp: , Rfl:    acyclovir (ZOVIRAX) 400 MG tablet, Take 1 tablet (400 mg total) by mouth 2 (two) times daily as needed., Disp: 60 tablet, Rfl: 2   apixaban (ELIQUIS) 5 MG TABS tablet, Take 1 tablet (5 mg total) by mouth 2 (two) times daily., Disp: 60 tablet, Rfl: 3   doxazosin (CARDURA) 2 MG tablet, Take 1 tablet (2 mg total) by mouth at bedtime., Disp: 90 tablet, Rfl: 3   fluconazole (DIFLUCAN) 200 MG tablet, Take 200 mg by mouth daily., Disp: , Rfl:    fluticasone (FLONASE) 50  MCG/ACT nasal spray, Place 2 sprays into the nose 2 (two) times daily as needed for allergies., Disp: , Rfl:    hydrALAZINE (APRESOLINE) 25 MG tablet, Take 1 tablet (25 mg total) by mouth 3 (three) times daily as needed. Take if standing BP >140/80 mm Hg, Disp: 90 tablet, Rfl: 2   levofloxacin (LEVAQUIN) 500 MG tablet, Take 500 mg by mouth daily., Disp: , Rfl:    levothyroxine (SYNTHROID) 175 MCG tablet, Take 175 mcg by mouth daily., Disp: , Rfl:    lidocaine-prilocaine (EMLA) cream, Apply 1 application topically as needed for up to 30 doses., Disp: 30 g, Rfl: 0   Loratadine (CLARITIN PO), Take 10 mg by mouth at bedtime. Allertec, Disp: , Rfl:    Melatonin 5 MG CHEW, Chew 1 tablet by mouth daily as needed (sleep)., Disp: , Rfl:    omeprazole (PRILOSEC) 20 MG capsule, TAKE 1 CAPSULE BY MOUTH TWICE A DAY BEFORE A MEAL, Disp: 180 capsule, Rfl: 0   potassium chloride (MICRO-K) 10 MEQ CR capsule, Take 10 mEq by mouth in the morning and at bedtime., Disp: , Rfl:    propranolol ER (INDERAL LA) 80 MG 24 hr capsule, Take 1 capsule (80 mg total) by mouth daily., Disp: 90 capsule, Rfl: 3   simvastatin (ZOCOR) 40 MG tablet, TAKE ONE   TABLET BY MOUTH DAILY AT BEDTIME (Patient taking differently: Take 40 mg by mouth daily at 6 PM.), Disp: 90 tablet, Rfl: 3   venetoclax 100 MG TABS, Take 100 mg by mouth in the morning, at noon, in the evening, and at bedtime., Disp: , Rfl:   Allergies: No Known Allergies  Past Medical History, Surgical history, Social history, and Family History were reviewed and updated.  Review of Systems: Review of Systems  Constitutional: Negative.   HENT:  Negative.    Eyes: Negative.   Respiratory: Negative.    Cardiovascular: Negative.   Gastrointestinal: Negative.   Endocrine: Negative.   Genitourinary: Negative.    Musculoskeletal: Negative.   Skin: Negative.   Neurological: Negative.   Hematological: Negative.   Psychiatric/Behavioral: Negative.     Physical Exam:  weight  is 202 lb (91.6 kg). His oral temperature is 98 F (36.7 C). His blood pressure is 128/64 and his pulse is 79. His respiration is 18 and oxygen saturation is 98%.   Wt Readings from Last 3 Encounters:  04/17/21 202 lb (91.6 kg)  04/10/21 209 lb (94.8 kg)  03/22/21 206 lb 3.2 oz (93.5 kg)    Physical Exam Vitals reviewed.  HENT:     Head: Normocephalic and atraumatic.  Eyes:     Pupils: Pupils are equal, round, and reactive to light.  Cardiovascular:     Rate and Rhythm: Normal rate and regular rhythm.     Heart sounds: Normal heart sounds.  Pulmonary:     Effort: Pulmonary effort is normal.     Breath sounds: Normal breath sounds.  Abdominal:     General: Bowel sounds are normal.     Palpations: Abdomen is soft.  Musculoskeletal:        General: No tenderness or deformity. Normal range of motion.     Cervical back: Normal range of motion.  Lymphadenopathy:     Cervical: No cervical adenopathy.  Skin:    General: Skin is warm and dry.     Findings: No erythema or rash.  Neurological:     Mental Status: He is alert and oriented to person, place, and time.  Psychiatric:        Behavior: Behavior normal.        Thought Content: Thought content normal.        Judgment: Judgment normal.     Lab Results  Component Value Date   WBC 3.7 (L) 04/17/2021   HGB 11.2 (L) 04/17/2021   HCT 34.7 (L) 04/17/2021   MCV 96.7 04/17/2021   PLT 60 (L) 04/17/2021     Chemistry      Component Value Date/Time   NA 141 04/17/2021 1000   NA 142 03/30/2015 1149   K 3.6 04/17/2021 1000   K 4.5 03/30/2015 1149   CL 107 04/17/2021 1000   CO2 28 04/17/2021 1000   CO2 25 03/30/2015 1149   BUN 17 04/17/2021 1000   BUN 17.1 03/30/2015 1149   CREATININE 1.00 04/17/2021 1000   CREATININE 1.2 03/30/2015 1149      Component Value Date/Time   CALCIUM 9.0 04/17/2021 1000   CALCIUM 8.7 03/30/2015 1149   ALKPHOS 61 04/17/2021 1000   AST 11 (L) 04/17/2021 1000   ALT 7 04/17/2021 1000    BILITOT 0.5 04/17/2021 1000       Impression and Plan: Victor [name] is a very nice 85-year-old white male who has acute myeloid leukemia.  He does have a adverse prognostic feature with   respect to the FLT3 mutation.  I know there are targeted therapies for patients who have leukemia with this FLT3 mutation.  Again, Dr. Linus Orn is doing a fantastic job with him.  He is responding well to the Vidaza and the venetoclax.  There is no indication that we have to put him on one of the new targeted therapy.  He will start his 7th cycle of treatment.    We will continue to follow his labs weekly at this point.  Again, we will set him up with a CT angiogram and a Doppler of his right leg in a couple weeks or so so we can see how the blood clots are responding.  We see that everything is responding well, we will move his Eliquis down to 2.5 mg p.o. twice daily.  I will plan to see him back myself in about 3 weeks.  Volanda Napoleon, MD 6/13/202211:04 AM

## 2021-04-17 NOTE — Patient Instructions (Signed)
Azacitidine suspension for injection (subcutaneous use) What is this medication? AZACITIDINE (ay za SITE i deen) is a chemotherapy drug. This medicine reduces the growth of cancer cells and can suppress the immune system. It is used fortreating myelodysplastic syndrome or some types of leukemia. This medicine may be used for other purposes; ask your health care provider orpharmacist if you have questions. COMMON BRAND NAME(S): Vidaza What should I tell my care team before I take this medication? They need to know if you have any of these conditions: kidney disease liver disease liver tumors an unusual or allergic reaction to azacitidine, mannitol, other medicines, foods, dyes, or preservatives pregnant or trying to get pregnant breast-feeding How should I use this medication? This medicine is for injection under the skin. It is administered in a hospitalor clinic by a specially trained health care professional. Talk to your pediatrician regarding the use of this medicine in children. Whilethis drug may be prescribed for selected conditions, precautions do apply. Overdosage: If you think you have taken too much of this medicine contact apoison control center or emergency room at once. NOTE: This medicine is only for you. Do not share this medicine with others. What if I miss a dose? It is important not to miss your dose. Call your doctor or health careprofessional if you are unable to keep an appointment. What may interact with this medication? Interactions have not been studied. Give your health care provider a list of all the medicines, herbs, non-prescription drugs, or dietary supplements you use. Also tell them if you smoke, drink alcohol, or use illegal drugs. Some items may interact with yourmedicine. This list may not describe all possible interactions. Give your health care provider a list of all the medicines, herbs, non-prescription drugs, or dietary supplements you use. Also tell  them if you smoke, drink alcohol, or use illegaldrugs. Some items may interact with your medicine. What should I watch for while using this medication? Visit your doctor for checks on your progress. This drug may make you feel generally unwell. This is not uncommon, as chemotherapy can affect healthy cells as well as cancer cells. Report any side effects. Continue your course oftreatment even though you feel ill unless your doctor tells you to stop. In some cases, you may be given additional medicines to help with side effects.Follow all directions for their use. Call your doctor or health care professional for advice if you get a fever, chills or sore throat, or other symptoms of a cold or flu. Do not treat yourself. This drug decreases your body's ability to fight infections. Try toavoid being around people who are sick. This medicine may increase your risk to bruise or bleed. Call your doctor orhealth care professional if you notice any unusual bleeding. You may need blood work done while you are taking this medicine. Do not become pregnant while taking this medicine and for 6 months after the last dose. Women should inform their doctor if they wish to become pregnant or think they might be pregnant. Men should not father a child while taking this medicine and for 3 months after the last dose. There is a potential for serious side effects to an unborn child. Talk to your health care professional or pharmacist for more information. Do not breast-feed an infant while taking thismedicine and for 1 week after the last dose. This medicine may interfere with the ability to have a child. Talk with yourdoctor or health care professional if you are concerned about your fertility.   What side effects may I notice from receiving this medication? Side effects that you should report to your doctor or health care professionalas soon as possible: allergic reactions like skin rash, itching or hives, swelling of the  face, lips, or tongue low blood counts - this medicine may decrease the number of white blood cells, red blood cells and platelets. You may be at increased risk for infections and bleeding. signs of infection - fever or chills, cough, sore throat, pain passing urine signs of decreased platelets or bleeding - bruising, pinpoint red spots on the skin, black, tarry stools, blood in the urine signs of decreased red blood cells - unusually weak or tired, fainting spells, lightheadedness signs and symptoms of kidney injury like trouble passing urine or change in the amount of urine signs and symptoms of liver injury like dark yellow or brown urine; general ill feeling or flu-like symptoms; light-colored stools; loss of appetite; nausea; right upper belly pain; unusually weak or tired; yellowing of the eyes or skin Side effects that usually do not require medical attention (report to yourdoctor or health care professional if they continue or are bothersome): constipation diarrhea nausea, vomiting pain or redness at the injection site unusually weak or tired This list may not describe all possible side effects. Call your doctor for medical advice about side effects. You may report side effects to FDA at1-800-FDA-1088. Where should I keep my medication? This drug is given in a hospital or clinic and will not be stored at home. NOTE: This sheet is a summary. It may not cover all possible information. If you have questions about this medicine, talk to your doctor, pharmacist, orhealth care provider.  2022 Elsevier/Gold Standard (2016-11-20 14:37:51)  

## 2021-04-17 NOTE — Patient Instructions (Signed)

## 2021-04-18 ENCOUNTER — Telehealth: Payer: Self-pay

## 2021-04-18 ENCOUNTER — Inpatient Hospital Stay: Payer: Medicare Other

## 2021-04-18 VITALS — BP 143/61 | HR 83 | Temp 98.2°F | Resp 18

## 2021-04-18 DIAGNOSIS — Z5111 Encounter for antineoplastic chemotherapy: Secondary | ICD-10-CM | POA: Diagnosis not present

## 2021-04-18 DIAGNOSIS — C9500 Acute leukemia of unspecified cell type not having achieved remission: Secondary | ICD-10-CM

## 2021-04-18 MED ORDER — SODIUM CHLORIDE 0.9 % IV SOLN
Freq: Once | INTRAVENOUS | Status: AC
  Filled 2021-04-18: qty 250

## 2021-04-18 MED ORDER — SODIUM CHLORIDE 0.9% FLUSH
10.0000 mL | INTRAVENOUS | Status: DC | PRN
  Administered 2021-04-18: 10 mL
  Filled 2021-04-18: qty 10

## 2021-04-18 MED ORDER — HEPARIN SOD (PORK) LOCK FLUSH 100 UNIT/ML IV SOLN
500.0000 [IU] | Freq: Once | INTRAVENOUS | Status: AC | PRN
  Administered 2021-04-18: 500 [IU]
  Filled 2021-04-18: qty 5

## 2021-04-18 MED ORDER — KETOROLAC TROMETHAMINE 15 MG/ML IJ SOLN
INTRAMUSCULAR | Status: AC
  Filled 2021-04-18: qty 2

## 2021-04-18 MED ORDER — SODIUM CHLORIDE 0.9 % IV SOLN
49.7000 mg/m2 | Freq: Once | INTRAVENOUS | Status: AC
  Administered 2021-04-18: 100 mg via INTRAVENOUS
  Filled 2021-04-18: qty 10

## 2021-04-18 MED ORDER — KETOROLAC TROMETHAMINE 15 MG/ML IJ SOLN
30.0000 mg | Freq: Once | INTRAMUSCULAR | Status: AC
  Administered 2021-04-18: 30 mg via INTRAVENOUS

## 2021-04-18 MED ORDER — SODIUM CHLORIDE 0.9 % IV SOLN
10.0000 mg | Freq: Once | INTRAVENOUS | Status: AC
  Administered 2021-04-18: 10 mg via INTRAVENOUS
  Filled 2021-04-18: qty 10

## 2021-04-18 NOTE — Patient Instructions (Signed)
Azacitidine suspension for injection (subcutaneous use) What is this medication? AZACITIDINE (ay za SITE i deen) is a chemotherapy drug. This medicine reduces the growth of cancer cells and can suppress the immune system. It is used fortreating myelodysplastic syndrome or some types of leukemia. This medicine may be used for other purposes; ask your health care provider orpharmacist if you have questions. COMMON BRAND NAME(S): Vidaza What should I tell my care team before I take this medication? They need to know if you have any of these conditions: kidney disease liver disease liver tumors an unusual or allergic reaction to azacitidine, mannitol, other medicines, foods, dyes, or preservatives pregnant or trying to get pregnant breast-feeding How should I use this medication? This medicine is for injection under the skin. It is administered in a hospitalor clinic by a specially trained health care professional. Talk to your pediatrician regarding the use of this medicine in children. Whilethis drug may be prescribed for selected conditions, precautions do apply. Overdosage: If you think you have taken too much of this medicine contact apoison control center or emergency room at once. NOTE: This medicine is only for you. Do not share this medicine with others. What if I miss a dose? It is important not to miss your dose. Call your doctor or health careprofessional if you are unable to keep an appointment. What may interact with this medication? Interactions have not been studied. Give your health care provider a list of all the medicines, herbs, non-prescription drugs, or dietary supplements you use. Also tell them if you smoke, drink alcohol, or use illegal drugs. Some items may interact with yourmedicine. This list may not describe all possible interactions. Give your health care provider a list of all the medicines, herbs, non-prescription drugs, or dietary supplements you use. Also tell  them if you smoke, drink alcohol, or use illegaldrugs. Some items may interact with your medicine. What should I watch for while using this medication? Visit your doctor for checks on your progress. This drug may make you feel generally unwell. This is not uncommon, as chemotherapy can affect healthy cells as well as cancer cells. Report any side effects. Continue your course oftreatment even though you feel ill unless your doctor tells you to stop. In some cases, you may be given additional medicines to help with side effects.Follow all directions for their use. Call your doctor or health care professional for advice if you get a fever, chills or sore throat, or other symptoms of a cold or flu. Do not treat yourself. This drug decreases your body's ability to fight infections. Try toavoid being around people who are sick. This medicine may increase your risk to bruise or bleed. Call your doctor orhealth care professional if you notice any unusual bleeding. You may need blood work done while you are taking this medicine. Do not become pregnant while taking this medicine and for 6 months after the last dose. Women should inform their doctor if they wish to become pregnant or think they might be pregnant. Men should not father a child while taking this medicine and for 3 months after the last dose. There is a potential for serious side effects to an unborn child. Talk to your health care professional or pharmacist for more information. Do not breast-feed an infant while taking thismedicine and for 1 week after the last dose. This medicine may interfere with the ability to have a child. Talk with yourdoctor or health care professional if you are concerned about your fertility.   What side effects may I notice from receiving this medication? Side effects that you should report to your doctor or health care professionalas soon as possible: allergic reactions like skin rash, itching or hives, swelling of the  face, lips, or tongue low blood counts - this medicine may decrease the number of white blood cells, red blood cells and platelets. You may be at increased risk for infections and bleeding. signs of infection - fever or chills, cough, sore throat, pain passing urine signs of decreased platelets or bleeding - bruising, pinpoint red spots on the skin, black, tarry stools, blood in the urine signs of decreased red blood cells - unusually weak or tired, fainting spells, lightheadedness signs and symptoms of kidney injury like trouble passing urine or change in the amount of urine signs and symptoms of liver injury like dark yellow or brown urine; general ill feeling or flu-like symptoms; light-colored stools; loss of appetite; nausea; right upper belly pain; unusually weak or tired; yellowing of the eyes or skin Side effects that usually do not require medical attention (report to yourdoctor or health care professional if they continue or are bothersome): constipation diarrhea nausea, vomiting pain or redness at the injection site unusually weak or tired This list may not describe all possible side effects. Call your doctor for medical advice about side effects. You may report side effects to FDA at1-800-FDA-1088. Where should I keep my medication? This drug is given in a hospital or clinic and will not be stored at home. NOTE: This sheet is a summary. It may not cover all possible information. If you have questions about this medicine, talk to your doctor, pharmacist, orhealth care provider.  2022 Elsevier/Gold Standard (2016-11-20 14:37:51)  

## 2021-04-18 NOTE — Telephone Encounter (Signed)
Appts added to 05/15/21 per 04/17/21 los, pt to gain a new sch in chemo/chekout

## 2021-04-19 ENCOUNTER — Inpatient Hospital Stay: Payer: Medicare Other

## 2021-04-19 ENCOUNTER — Other Ambulatory Visit: Payer: Self-pay

## 2021-04-19 VITALS — BP 151/64 | HR 79 | Temp 98.0°F | Resp 16

## 2021-04-19 DIAGNOSIS — C9500 Acute leukemia of unspecified cell type not having achieved remission: Secondary | ICD-10-CM

## 2021-04-19 DIAGNOSIS — Z5111 Encounter for antineoplastic chemotherapy: Secondary | ICD-10-CM | POA: Diagnosis not present

## 2021-04-19 MED ORDER — PALONOSETRON HCL INJECTION 0.25 MG/5ML
INTRAVENOUS | Status: AC
  Filled 2021-04-19: qty 5

## 2021-04-19 MED ORDER — HEPARIN SOD (PORK) LOCK FLUSH 100 UNIT/ML IV SOLN
500.0000 [IU] | Freq: Once | INTRAVENOUS | Status: AC | PRN
  Administered 2021-04-19: 500 [IU]
  Filled 2021-04-19: qty 5

## 2021-04-19 MED ORDER — SODIUM CHLORIDE 0.9 % IV SOLN
49.7000 mg/m2 | Freq: Once | INTRAVENOUS | Status: AC
  Administered 2021-04-19: 100 mg via INTRAVENOUS
  Filled 2021-04-19: qty 10

## 2021-04-19 MED ORDER — SODIUM CHLORIDE 0.9 % IV SOLN
10.0000 mg | Freq: Once | INTRAVENOUS | Status: AC
  Administered 2021-04-19: 10 mg via INTRAVENOUS
  Filled 2021-04-19: qty 10

## 2021-04-19 MED ORDER — PALONOSETRON HCL INJECTION 0.25 MG/5ML
0.2500 mg | Freq: Once | INTRAVENOUS | Status: AC
  Administered 2021-04-19: 0.25 mg via INTRAVENOUS

## 2021-04-19 MED ORDER — SODIUM CHLORIDE 0.9 % IV SOLN
Freq: Once | INTRAVENOUS | Status: AC
  Filled 2021-04-19: qty 250

## 2021-04-19 MED ORDER — KETOROLAC TROMETHAMINE 15 MG/ML IJ SOLN
30.0000 mg | Freq: Once | INTRAMUSCULAR | Status: AC
  Administered 2021-04-19: 30 mg via INTRAVENOUS

## 2021-04-19 MED ORDER — KETOROLAC TROMETHAMINE 15 MG/ML IJ SOLN
INTRAMUSCULAR | Status: AC
  Filled 2021-04-19: qty 2

## 2021-04-19 MED ORDER — SODIUM CHLORIDE 0.9% FLUSH
10.0000 mL | INTRAVENOUS | Status: DC | PRN
  Administered 2021-04-19: 10 mL
  Filled 2021-04-19: qty 10

## 2021-04-19 NOTE — Patient Instructions (Signed)
[facility]  Discharge Instructions: °Thank you for choosing [facility] to provide your oncology and hematology care.  ° °If you have a lab appointment with the Cancer Center, please go directly to the Cancer Center and check in at the registration area. ° °Wear comfortable clothing and clothing appropriate for easy access to any Portacath or PICC line.  ° °We strive to give you quality time with your provider. You may need to reschedule your appointment if you arrive late (15 or more minutes).  Arriving late affects you and other patients whose appointments are after yours.  Also, if you miss three or more appointments without notifying the office, you may be dismissed from the clinic at the provider’s discretion.    °  °For prescription refill requests, have your pharmacy contact our office and allow 72 hours for refills to be completed.   ° °Today you received the following chemotherapy and/or immunotherapy agents Vidaza °  °To help prevent nausea and vomiting after your treatment, we encourage you to take your nausea medication as directed. ° °BELOW ARE SYMPTOMS THAT SHOULD BE REPORTED IMMEDIATELY: °*FEVER GREATER THAN 100.4 F (38 °C) OR HIGHER °*CHILLS OR SWEATING °*NAUSEA AND VOMITING THAT IS NOT CONTROLLED WITH YOUR NAUSEA MEDICATION °*UNUSUAL SHORTNESS OF BREATH °*UNUSUAL BRUISING OR BLEEDING °*URINARY PROBLEMS (pain or burning when urinating, or frequent urination) °*BOWEL PROBLEMS (unusual diarrhea, constipation, pain near the anus) °TENDERNESS IN MOUTH AND THROAT WITH OR WITHOUT PRESENCE OF ULCERS (sore throat, sores in mouth, or a toothache) °UNUSUAL RASH, SWELLING OR PAIN  °UNUSUAL VAGINAL DISCHARGE OR ITCHING  ° °Items with * indicate a potential emergency and should be followed up as soon as possible or go to the Emergency Department if any problems should occur. ° °Please show the CHEMOTHERAPY ALERT CARD or IMMUNOTHERAPY ALERT CARD at check-in to the  Emergency Department and triage nurse. °Should you have questions after your visit or need to cancel or reschedule your appointment, please contact [facility]  [phone] and follow the prompts.  Office hours are 8:00 a.m. to 4:30 p.m. Monday - Friday. Please note that voicemails left after 4:00 p.m. may not be returned until the following business day.  We are closed weekends and major holidays. You have access to a nurse at all times for urgent questions. Please call the main number to the clinic [phone] and follow the prompts. ° °For any non-urgent questions, you may also contact your provider using MyChart. We now offer e-Visits for anyone 18 and older to request care online for non-urgent symptoms. For details visit [email or web address]. °  °Also download the MyChart app! Go to the app store, search "MyChart", open the app, select [facility], and log in with your MyChart username and password. ° °Due to Covid, a mask is required upon entering the hospital/clinic. If you do not have a mask, one will be given to you upon arrival. For doctor visits, patients may have 1 support person aged 18 or older with them. For treatment visits, patients cannot have anyone with them due to current Covid guidelines and our immunocompromised population.  °

## 2021-04-20 ENCOUNTER — Inpatient Hospital Stay: Payer: Medicare Other

## 2021-04-20 VITALS — BP 148/48 | HR 79 | Temp 97.9°F | Resp 20 | Ht 70.0 in | Wt 210.0 lb

## 2021-04-20 DIAGNOSIS — Z5111 Encounter for antineoplastic chemotherapy: Secondary | ICD-10-CM | POA: Diagnosis not present

## 2021-04-20 DIAGNOSIS — M25579 Pain in unspecified ankle and joints of unspecified foot: Secondary | ICD-10-CM

## 2021-04-20 DIAGNOSIS — C9500 Acute leukemia of unspecified cell type not having achieved remission: Secondary | ICD-10-CM

## 2021-04-20 MED ORDER — SODIUM CHLORIDE 0.9 % IV SOLN
10.0000 mg | Freq: Once | INTRAVENOUS | Status: AC
  Administered 2021-04-20: 10 mg via INTRAVENOUS
  Filled 2021-04-20: qty 10

## 2021-04-20 MED ORDER — SODIUM CHLORIDE 0.9 % IV SOLN
49.7000 mg/m2 | Freq: Once | INTRAVENOUS | Status: AC
  Administered 2021-04-20: 100 mg via INTRAVENOUS
  Filled 2021-04-20: qty 10

## 2021-04-20 MED ORDER — SODIUM CHLORIDE 0.9% FLUSH
10.0000 mL | INTRAVENOUS | Status: DC | PRN
  Administered 2021-04-20: 10 mL
  Filled 2021-04-20: qty 10

## 2021-04-20 MED ORDER — SODIUM CHLORIDE 0.9 % IV SOLN
Freq: Once | INTRAVENOUS | Status: AC
  Filled 2021-04-20: qty 250

## 2021-04-20 MED ORDER — KETOROLAC TROMETHAMINE 15 MG/ML IJ SOLN
INTRAMUSCULAR | Status: AC
  Filled 2021-04-20: qty 2

## 2021-04-20 MED ORDER — KETOROLAC TROMETHAMINE 15 MG/ML IJ SOLN
30.0000 mg | Freq: Once | INTRAMUSCULAR | Status: AC
Start: 2021-04-20 — End: 2021-04-20
  Administered 2021-04-20: 30 mg via INTRAVENOUS
  Filled 2021-04-20: qty 2

## 2021-04-20 MED ORDER — HEPARIN SOD (PORK) LOCK FLUSH 100 UNIT/ML IV SOLN
500.0000 [IU] | Freq: Once | INTRAVENOUS | Status: AC | PRN
  Administered 2021-04-20: 500 [IU]
  Filled 2021-04-20: qty 5

## 2021-04-20 NOTE — Patient Instructions (Signed)
[facility]  Discharge Instructions: °Thank you for choosing [facility] to provide your oncology and hematology care.  ° °If you have a lab appointment with the Cancer Center, please go directly to the Cancer Center and check in at the registration area. ° °Wear comfortable clothing and clothing appropriate for easy access to any Portacath or PICC line.  ° °We strive to give you quality time with your provider. You may need to reschedule your appointment if you arrive late (15 or more minutes).  Arriving late affects you and other patients whose appointments are after yours.  Also, if you miss three or more appointments without notifying the office, you may be dismissed from the clinic at the provider’s discretion.    °  °For prescription refill requests, have your pharmacy contact our office and allow 72 hours for refills to be completed.   ° °Today you received the following chemotherapy and/or immunotherapy agents Vidaza °  °To help prevent nausea and vomiting after your treatment, we encourage you to take your nausea medication as directed. ° °BELOW ARE SYMPTOMS THAT SHOULD BE REPORTED IMMEDIATELY: °*FEVER GREATER THAN 100.4 F (38 °C) OR HIGHER °*CHILLS OR SWEATING °*NAUSEA AND VOMITING THAT IS NOT CONTROLLED WITH YOUR NAUSEA MEDICATION °*UNUSUAL SHORTNESS OF BREATH °*UNUSUAL BRUISING OR BLEEDING °*URINARY PROBLEMS (pain or burning when urinating, or frequent urination) °*BOWEL PROBLEMS (unusual diarrhea, constipation, pain near the anus) °TENDERNESS IN MOUTH AND THROAT WITH OR WITHOUT PRESENCE OF ULCERS (sore throat, sores in mouth, or a toothache) °UNUSUAL RASH, SWELLING OR PAIN  °UNUSUAL VAGINAL DISCHARGE OR ITCHING  ° °Items with * indicate a potential emergency and should be followed up as soon as possible or go to the Emergency Department if any problems should occur. ° °Please show the CHEMOTHERAPY ALERT CARD or IMMUNOTHERAPY ALERT CARD at check-in to the  Emergency Department and triage nurse. °Should you have questions after your visit or need to cancel or reschedule your appointment, please contact [facility]  [phone] and follow the prompts.  Office hours are 8:00 a.m. to 4:30 p.m. Monday - Friday. Please note that voicemails left after 4:00 p.m. may not be returned until the following business day.  We are closed weekends and major holidays. You have access to a nurse at all times for urgent questions. Please call the main number to the clinic [phone] and follow the prompts. ° °For any non-urgent questions, you may also contact your provider using MyChart. We now offer e-Visits for anyone 18 and older to request care online for non-urgent symptoms. For details visit [email or web address]. °  °Also download the MyChart app! Go to the app store, search "MyChart", open the app, select [facility], and log in with your MyChart username and password. ° °Due to Covid, a mask is required upon entering the hospital/clinic. If you do not have a mask, one will be given to you upon arrival. For doctor visits, patients may have 1 support person aged 18 or older with them. For treatment visits, patients cannot have anyone with them due to current Covid guidelines and our immunocompromised population.  °

## 2021-04-20 NOTE — Progress Notes (Signed)
Ok to treat with Platelets less than 100 per dr [name].

## 2021-04-21 ENCOUNTER — Inpatient Hospital Stay: Payer: Medicare Other

## 2021-04-21 ENCOUNTER — Other Ambulatory Visit: Payer: Self-pay

## 2021-04-21 VITALS — BP 141/44 | HR 73 | Temp 98.0°F | Resp 18

## 2021-04-21 DIAGNOSIS — C9500 Acute leukemia of unspecified cell type not having achieved remission: Secondary | ICD-10-CM

## 2021-04-21 DIAGNOSIS — Z5111 Encounter for antineoplastic chemotherapy: Secondary | ICD-10-CM | POA: Diagnosis not present

## 2021-04-21 MED ORDER — HEPARIN SOD (PORK) LOCK FLUSH 100 UNIT/ML IV SOLN
500.0000 [IU] | Freq: Once | INTRAVENOUS | Status: AC | PRN
  Administered 2021-04-21: 500 [IU]
  Filled 2021-04-21: qty 5

## 2021-04-21 MED ORDER — SODIUM CHLORIDE 0.9% FLUSH
10.0000 mL | INTRAVENOUS | Status: DC | PRN
Start: 2021-04-21 — End: 2021-04-21
  Administered 2021-04-21: 10 mL
  Filled 2021-04-21: qty 10

## 2021-04-21 MED ORDER — AZACITIDINE CHEMO INJECTION 100 MG
49.5000 mg/m2 | Freq: Once | INTRAMUSCULAR | Status: AC
  Administered 2021-04-21: 100 mg via INTRAVENOUS
  Filled 2021-04-21: qty 10

## 2021-04-21 MED ORDER — PALONOSETRON HCL INJECTION 0.25 MG/5ML
0.2500 mg | Freq: Once | INTRAVENOUS | Status: AC
  Administered 2021-04-21: 0.25 mg via INTRAVENOUS

## 2021-04-21 MED ORDER — PALONOSETRON HCL INJECTION 0.25 MG/5ML
INTRAVENOUS | Status: AC
  Filled 2021-04-21: qty 5

## 2021-04-21 MED ORDER — SODIUM CHLORIDE 0.9 % IV SOLN
10.0000 mg | Freq: Once | INTRAVENOUS | Status: AC
  Administered 2021-04-21: 10 mg via INTRAVENOUS
  Filled 2021-04-21: qty 10

## 2021-04-21 MED ORDER — SODIUM CHLORIDE 0.9 % IV SOLN
Freq: Once | INTRAVENOUS | Status: AC
  Filled 2021-04-21: qty 250

## 2021-04-21 MED ORDER — KETOROLAC TROMETHAMINE 15 MG/ML IJ SOLN
INTRAMUSCULAR | Status: AC
  Filled 2021-04-21: qty 2

## 2021-04-21 MED ORDER — KETOROLAC TROMETHAMINE 15 MG/ML IJ SOLN
30.0000 mg | Freq: Once | INTRAMUSCULAR | Status: AC
Start: 2021-04-21 — End: 2021-04-21
  Administered 2021-04-21: 30 mg via INTRAVENOUS

## 2021-04-21 NOTE — Patient Instructions (Addendum)
[facility]  Discharge Instructions: Thank you for choosing [facility] to provide your oncology and hematology care.   If you have a lab appointment with the [facility], please go directly to the [facility] and check in at the registration area.  Wear comfortable clothing and clothing appropriate for easy access to any Portacath or PICC line.   We strive to give you quality time with your provider. You may need to reschedule your appointment if you arrive late (15 or more minutes).  Arriving late affects you and other patients whose appointments are after yours.  Also, if you miss three or more appointments without notifying the office, you may be dismissed from the clinic at the provider's discretion.      For prescription refill requests, have your pharmacy contact our office and allow 72 hours for refills to be completed.    Today you received the following chemotherapy and/or immunotherapy agents Vidaza      To help prevent nausea and vomiting after your treatment, we encourage you to take your nausea medication as directed.  BELOW ARE SYMPTOMS THAT SHOULD BE REPORTED IMMEDIATELY: *FEVER GREATER THAN 100.4 F (38 C) OR HIGHER *CHILLS OR SWEATING *NAUSEA AND VOMITING THAT IS NOT CONTROLLED WITH YOUR NAUSEA MEDICATION *UNUSUAL SHORTNESS OF BREATH *UNUSUAL BRUISING OR BLEEDING *URINARY PROBLEMS (pain or burning when urinating, or frequent urination) *BOWEL PROBLEMS (unusual diarrhea, constipation, pain near the anus) TENDERNESS IN MOUTH AND THROAT WITH OR WITHOUT PRESENCE OF ULCERS (sore throat, sores in mouth, or a toothache) UNUSUAL RASH, SWELLING OR PAIN  UNUSUAL VAGINAL DISCHARGE OR ITCHING   Items with * indicate a potential emergency and should be followed up as soon as possible or go to the Emergency Department if any problems should occur.  Please show the CHEMOTHERAPY ALERT CARD or IMMUNOTHERAPY ALERT CARD at check-in to the  Emergency Department and triage nurse. Should you have questions after your visit or need to cancel or reschedule your appointment, please contact [facility]  [phone] and follow the prompts.  Office hours are 8:00 a.m. to 4:30 p.m. Monday - Friday. Please note that voicemails left after 4:00 p.m. may not be returned until the following business day.  We are closed weekends and major holidays. You have access to a nurse at all times for urgent questions. Please call the main number to the clinic [phone] and follow the prompts.  For any non-urgent questions, you may also contact your provider using MyChart. We now offer e-Visits for anyone 74 and older to request care online for non-urgent symptoms. For details visit [email or web address].   Also download the MyChart app! Go to the app store, search "MyChart", open the app, select [facility], and log in with your MyChart username and password.  Due to Covid, a mask is required upon entering the hospital/clinic. If you do not have a mask, one will be given to you upon arrival. For doctor visits, patients may have 1 support person aged 94 or older with them. For treatment visits, patients cannot have anyone with them due to current Covid guidelines and our immunocompromised population. Azacitidine suspension for injection (subcutaneous use) What is this medication? AZACITIDINE (ay [place]) is a chemotherapy drug. This medicine reduces the growth of cancer cells and can suppress the immune system. It is used fortreating myelodysplastic syndrome or some types of leukemia. This medicine may be used for other purposes; ask your  health care provider orpharmacist if you have questions. COMMON BRAND NAME(S): Vidaza What should I tell my care team before I take this medication? They need to know if you have any of these conditions: kidney disease liver disease liver tumors an unusual or allergic reaction to  azacitidine, mannitol, other medicines, foods, dyes, or preservatives pregnant or trying to get pregnant breast-feeding How should I use this medication? This medicine is for injection under the skin. It is administered in a hospitalor clinic by a specially trained health care professional. Talk to your pediatrician regarding the use of this medicine in children. Whilethis drug may be prescribed for selected conditions, precautions do apply. Overdosage: If you think you have taken too much of this medicine contact apoison control center or emergency room at once. NOTE: This medicine is only for you. Do not share this medicine with others. What if I miss a dose? It is important not to miss your dose. Call your doctor or health careprofessional if you are unable to keep an appointment. What may interact with this medication? Interactions have not been studied. Give your health care provider a list of all the medicines, herbs, non-prescription drugs, or dietary supplements you use. Also tell them if you smoke, drink alcohol, or use illegal drugs. Some items may interact with yourmedicine. This list may not describe all possible interactions. Give your health care provider a list of all the medicines, herbs, non-prescription drugs, or dietary supplements you use. Also tell them if you smoke, drink alcohol, or use illegaldrugs. Some items may interact with your medicine. What should I watch for while using this medication? Visit your doctor for checks on your progress. This drug may make you feel generally unwell. This is not uncommon, as chemotherapy can affect healthy cells as well as cancer cells. Report any side effects. Continue your course oftreatment even though you feel ill unless your doctor tells you to stop. In some cases, you may be given additional medicines to help with side effects.Follow all directions for their use. Call your doctor or health care professional for advice if you get a  fever, chills or sore throat, or other symptoms of a cold or flu. Do not treat yourself. This drug decreases your body's ability to fight infections. Try toavoid being around people who are sick. This medicine may increase your risk to bruise or bleed. Call your doctor orhealth care professional if you notice any unusual bleeding. You may need blood work done while you are taking this medicine. Do not become pregnant while taking this medicine and for 6 months after the last dose. Women should inform their doctor if they wish to become pregnant or think they might be pregnant. Men should not father a child while taking this medicine and for 3 months after the last dose. There is a potential for serious side effects to an unborn child. Talk to your health care professional or pharmacist for more information. Do not breast-feed an infant while taking thismedicine and for 1 week after the last dose. This medicine may interfere with the ability to have a child. Talk with yourdoctor or health care professional if you are concerned about your fertility. What side effects may I notice from receiving this medication? Side effects that you should report to your doctor or health care professionalas soon as possible: allergic reactions like skin rash, itching or hives, swelling of the face, lips, or tongue low blood counts - this medicine may decrease the number of white blood  cells, red blood cells and platelets. You may be at increased risk for infections and bleeding. signs of infection - fever or chills, cough, sore throat, pain passing urine signs of decreased platelets or bleeding - bruising, pinpoint red spots on the skin, black, tarry stools, blood in the urine signs of decreased red blood cells - unusually weak or tired, fainting spells, lightheadedness signs and symptoms of kidney injury like trouble passing urine or change in the amount of urine signs and symptoms of liver injury like dark yellow or  brown urine; general ill feeling or flu-like symptoms; light-colored stools; loss of appetite; nausea; right upper belly pain; unusually weak or tired; yellowing of the eyes or skin Side effects that usually do not require medical attention (report to yourdoctor or health care professional if they continue or are bothersome): constipation diarrhea nausea, vomiting pain or redness at the injection site unusually weak or tired This list may not describe all possible side effects. Call your doctor for medical advice about side effects. You may report side effects to FDA at1-800-FDA-1088. Where should I keep my medication? This drug is given in a hospital or clinic and will not be stored at home. NOTE: This sheet is a summary. It may not cover all possible information. If you have questions about this medicine, talk to your doctor, pharmacist, orhealth care provider.  2022 Elsevier/Gold Standard (2016-11-20 14:37:51)

## 2021-04-24 ENCOUNTER — Telehealth: Payer: Self-pay | Admitting: *Deleted

## 2021-04-24 ENCOUNTER — Inpatient Hospital Stay: Payer: Medicare Other

## 2021-04-24 ENCOUNTER — Other Ambulatory Visit: Payer: Self-pay

## 2021-04-24 VITALS — BP 128/51 | HR 74 | Temp 97.8°F | Resp 19

## 2021-04-24 DIAGNOSIS — Z452 Encounter for adjustment and management of vascular access device: Secondary | ICD-10-CM

## 2021-04-24 DIAGNOSIS — C9500 Acute leukemia of unspecified cell type not having achieved remission: Secondary | ICD-10-CM

## 2021-04-24 DIAGNOSIS — C9300 Acute monoblastic/monocytic leukemia, not having achieved remission: Secondary | ICD-10-CM

## 2021-04-24 DIAGNOSIS — Z5111 Encounter for antineoplastic chemotherapy: Secondary | ICD-10-CM | POA: Diagnosis not present

## 2021-04-24 LAB — CMP (CANCER CENTER ONLY)
ALT: 12 U/L (ref 0–44)
AST: 11 U/L — ABNORMAL LOW (ref 15–41)
Albumin: 3.5 g/dL (ref 3.5–5.0)
Alkaline Phosphatase: 42 U/L (ref 38–126)
Anion gap: 8 (ref 5–15)
BUN: 25 mg/dL — ABNORMAL HIGH (ref 8–23)
CO2: 28 mmol/L (ref 22–32)
Calcium: 8.3 mg/dL — ABNORMAL LOW (ref 8.9–10.3)
Chloride: 106 mmol/L (ref 98–111)
Creatinine: 1.09 mg/dL (ref 0.61–1.24)
GFR, Estimated: >60 mL/min (ref >60)
Glucose, Bld: 90 mg/dL (ref 70–99)
Potassium: 3.6 mmol/L (ref 3.5–5.1)
Sodium: 142 mmol/L (ref 135–145)
Total Bilirubin: 0.6 mg/dL (ref 0.3–1.2)
Total Protein: 4.8 g/dL — ABNORMAL LOW (ref 6.5–8.1)

## 2021-04-24 LAB — CBC WITH DIFFERENTIAL (CANCER CENTER ONLY)
Abs Immature Granulocytes: 0.08 10*3/uL — ABNORMAL HIGH (ref 0.00–0.07)
Basophils Absolute: 0 10*3/uL (ref 0.0–0.1)
Basophils Relative: 0 %
Eosinophils Absolute: 0 10*3/uL (ref 0.0–0.5)
Eosinophils Relative: 0 %
HCT: 35.4 % — ABNORMAL LOW (ref 39.0–52.0)
Hemoglobin: 11.6 g/dL — ABNORMAL LOW (ref 13.0–17.0)
Immature Granulocytes: 2 %
Lymphocytes Relative: 23 %
Lymphs Abs: 1 10*3/uL (ref 0.7–4.0)
MCH: 32 pg (ref 26.0–34.0)
MCHC: 32.8 g/dL (ref 30.0–36.0)
MCV: 97.5 fL (ref 80.0–100.0)
Monocytes Absolute: 0.3 10*3/uL (ref 0.1–1.0)
Monocytes Relative: 7 %
Neutro Abs: 3.1 10*3/uL (ref 1.7–7.7)
Neutrophils Relative %: 68 %
Platelet Count: 54 10*3/uL — ABNORMAL LOW (ref 150–400)
RBC: 3.63 MIL/uL — ABNORMAL LOW (ref 4.22–5.81)
RDW: 16.3 % — ABNORMAL HIGH (ref 11.5–15.5)
WBC Count: 4.5 10*3/uL (ref 4.0–10.5)
nRBC: 0.7 % — ABNORMAL HIGH (ref 0.0–0.2)

## 2021-04-24 MED ORDER — KETOROLAC TROMETHAMINE 15 MG/ML IJ SOLN
30.0000 mg | Freq: Once | INTRAMUSCULAR | Status: AC
  Administered 2021-04-24: 30 mg via INTRAVENOUS
  Filled 2021-04-24: qty 2

## 2021-04-24 MED ORDER — HEPARIN SOD (PORK) LOCK FLUSH 100 UNIT/ML IV SOLN
250.0000 [IU] | Freq: Once | INTRAVENOUS | Status: AC
Start: 2021-04-24 — End: 2021-04-24
  Administered 2021-04-24: 500 [IU]
  Filled 2021-04-24: qty 5

## 2021-04-24 MED ORDER — SODIUM CHLORIDE 0.9% FLUSH
10.0000 mL | Freq: Once | INTRAVENOUS | Status: AC
  Administered 2021-04-24: 10 mL
  Filled 2021-04-24: qty 10

## 2021-04-24 MED ORDER — KETOROLAC TROMETHAMINE 15 MG/ML IJ SOLN
INTRAMUSCULAR | Status: AC
  Filled 2021-04-24: qty 2

## 2021-04-24 NOTE — Patient Instructions (Signed)
Implanted Port Home Guide An implanted port is a device that is placed under the skin. It is usually placed in the chest. The device can be used to give IV medicine, to take blood, or for dialysis. You may have an implanted port if: You need IV medicine that would be irritating to the small veins in your hands or arms. You need IV medicines, such as antibiotics, for a long period of time. You need IV nutrition for a long period of time. You need dialysis. When you have a port, your health care provider can choose to use the port instead of veins in your arms for these procedures. You may have fewer limitations when using a port than you would if you used other types of long-term IVs, and you will likely be able to return to normal activities afteryour incision heals. An implanted port has two main parts: Reservoir. The reservoir is the part where a needle is inserted to give medicines or draw blood. The reservoir is round. After it is placed, it appears as a small, raised area under your skin. Catheter. The catheter is a thin, flexible tube that connects the reservoir to a vein. Medicine that is inserted into the reservoir goes into the catheter and then into the vein. How is my port accessed? To access your port: A numbing cream may be placed on the skin over the port site. Your health care provider will put on a mask and sterile gloves. The skin over your port will be cleaned carefully with a germ-killing soap and allowed to dry. Your health care provider will gently pinch the port and insert a needle into it. Your health care provider will check for a blood return to make sure the port is in the vein and is not clogged. If your port needs to remain accessed to get medicine continuously (constant infusion), your health care provider will place a clear bandage (dressing) over the needle site. The dressing and needle will need to be changed every week, or as told by your health care provider. What  is flushing? Flushing helps keep the port from getting clogged. Follow instructions from your health care provider about how and when to flush the port. Ports are usually flushed with saline solution or a medicine called heparin. The need for flushing will depend on how the port is used: If the port is only used from time to time to give medicines or draw blood, the port may need to be flushed: Before and after medicines have been given. Before and after blood has been drawn. As part of routine maintenance. Flushing may be recommended every 4-6 weeks. If a constant infusion is running, the port may not need to be flushed. Throw away any syringes in a disposal container that is meant for sharp items (sharps container). You can buy a sharps container from a pharmacy, or you can make one by using an empty hard plastic bottle with a cover. How long will my port stay implanted? The port can stay in for as long as your health care provider thinks it is needed. When it is time for the port to come out, a surgery will be done to remove it. The surgery will be similar to the procedure that was done to putthe port in. Follow these instructions at home:  Flush your port as told by your health care provider. If you need an infusion over several days, follow instructions from your health care provider about how to take   care of your port site. Make sure you: Wash your hands with soap and water before you change your dressing. If soap and water are not available, use alcohol-based hand sanitizer. Change your dressing as told by your health care provider. Place any used dressings or infusion bags into a plastic bag. Throw that bag in the trash. Keep the dressing that covers the needle clean and dry. Do not get it wet. Do not use scissors or sharp objects near the tube. Keep the tube clamped, unless it is being used. Check your port site every day for signs of infection. Check for: Redness, swelling, or  pain. Fluid or blood. Pus or a bad smell. Protect the skin around the port site. Avoid wearing bra straps that rub or irritate the site. Protect the skin around your port from seat belts. Place a soft pad over your chest if needed. Bathe or shower as told by your health care provider. The site may get wet as long as you are not actively receiving an infusion. Return to your normal activities as told by your health care provider. Ask your health care provider what activities are safe for you. Carry a medical alert card or wear a medical alert bracelet at all times. This will let health care providers know that you have an implanted port in case of an emergency. Get help right away if: You have redness, swelling, or pain at the port site. You have fluid or blood coming from your port site. You have pus or a bad smell coming from the port site. You have a fever. Summary Implanted ports are usually placed in the chest for long-term IV access. Follow instructions from your health care provider about flushing the port and changing bandages (dressings). Take care of the area around your port by avoiding clothing that puts pressure on the area, and by watching for signs of infection. Protect the skin around your port from seat belts. Place a soft pad over your chest if needed. Get help right away if you have a fever or you have redness, swelling, pain, drainage, or a bad smell at the port site. This information is not intended to replace advice given to you by your health care provider. Make sure you discuss any questions you have with your healthcare provider. Document Revised: 03/07/2020 Document Reviewed: 03/07/2020 Elsevier Patient Education  2022 Elsevier Inc.  

## 2021-04-24 NOTE — Telephone Encounter (Signed)
Call received from patient stating he cannot get in touch with his PCP to get his Tramadol refilled, and would like to know if Dr. [name] would send him in a prescription for oral Toradol.  Dr. [name] notified.  Call placed back to patient and patient notified that a prescription for Toradol will not be called in per Dr. [name] and to contact his PCP for further refills of Tramadol or change in pain medicine.  Pt states that he will contact his PCP regarding a change in pain medicine for his back.

## 2021-04-25 ENCOUNTER — Other Ambulatory Visit: Payer: Self-pay | Admitting: Nurse Practitioner

## 2021-04-25 DIAGNOSIS — C9500 Acute leukemia of unspecified cell type not having achieved remission: Secondary | ICD-10-CM

## 2021-04-25 DIAGNOSIS — R079 Chest pain, unspecified: Secondary | ICD-10-CM

## 2021-04-25 NOTE — Telephone Encounter (Signed)
Refill request--but not our patient any longer

## 2021-04-26 ENCOUNTER — Other Ambulatory Visit: Payer: Self-pay | Admitting: Family Medicine

## 2021-04-26 DIAGNOSIS — M47816 Spondylosis without myelopathy or radiculopathy, lumbar region: Secondary | ICD-10-CM

## 2021-04-26 DIAGNOSIS — M5416 Radiculopathy, lumbar region: Secondary | ICD-10-CM

## 2021-04-26 DIAGNOSIS — M5136 Other intervertebral disc degeneration, lumbar region: Secondary | ICD-10-CM

## 2021-04-27 ENCOUNTER — Telehealth: Payer: Self-pay

## 2021-04-27 ENCOUNTER — Encounter: Payer: Self-pay | Admitting: *Deleted

## 2021-04-27 DIAGNOSIS — Z95828 Presence of other vascular implants and grafts: Secondary | ICD-10-CM | POA: Insufficient documentation

## 2021-04-27 NOTE — Telephone Encounter (Signed)
Made in Error

## 2021-05-01 ENCOUNTER — Telehealth: Payer: Self-pay

## 2021-05-01 ENCOUNTER — Inpatient Hospital Stay: Payer: Medicare Other

## 2021-05-01 ENCOUNTER — Ambulatory Visit (INDEPENDENT_AMBULATORY_CARE_PROVIDER_SITE_OTHER): Payer: Medicare Other | Admitting: Family Medicine

## 2021-05-01 ENCOUNTER — Other Ambulatory Visit: Payer: Self-pay

## 2021-05-01 ENCOUNTER — Encounter: Payer: Self-pay | Admitting: Family Medicine

## 2021-05-01 VITALS — BP 122/78 | HR 82 | Temp 98.4°F | Ht 70.0 in | Wt 209.8 lb

## 2021-05-01 VITALS — BP 147/60 | HR 85 | Temp 98.5°F | Resp 19

## 2021-05-01 DIAGNOSIS — M47816 Spondylosis without myelopathy or radiculopathy, lumbar region: Secondary | ICD-10-CM

## 2021-05-01 DIAGNOSIS — G5611 Other lesions of median nerve, right upper limb: Secondary | ICD-10-CM | POA: Diagnosis not present

## 2021-05-01 DIAGNOSIS — G5761 Lesion of plantar nerve, right lower limb: Secondary | ICD-10-CM | POA: Insufficient documentation

## 2021-05-01 DIAGNOSIS — Z95828 Presence of other vascular implants and grafts: Secondary | ICD-10-CM

## 2021-05-01 DIAGNOSIS — C9500 Acute leukemia of unspecified cell type not having achieved remission: Secondary | ICD-10-CM

## 2021-05-01 DIAGNOSIS — Z5111 Encounter for antineoplastic chemotherapy: Secondary | ICD-10-CM | POA: Diagnosis not present

## 2021-05-01 LAB — CBC WITH DIFFERENTIAL (CANCER CENTER ONLY)
Abs Immature Granulocytes: 0 10*3/uL (ref 0.00–0.07)
Basophils Absolute: 0 10*3/uL (ref 0.0–0.1)
Basophils Relative: 0 %
Eosinophils Absolute: 0 10*3/uL (ref 0.0–0.5)
Eosinophils Relative: 0 %
HCT: 34.2 % — ABNORMAL LOW (ref 39.0–52.0)
Hemoglobin: 11.1 g/dL — ABNORMAL LOW (ref 13.0–17.0)
Immature Granulocytes: 0 %
Lymphocytes Relative: 44 %
Lymphs Abs: 0.6 10*3/uL — ABNORMAL LOW (ref 0.7–4.0)
MCH: 31.9 pg (ref 26.0–34.0)
MCHC: 32.5 g/dL (ref 30.0–36.0)
MCV: 98.3 fL (ref 80.0–100.0)
Monocytes Absolute: 0.1 10*3/uL (ref 0.1–1.0)
Monocytes Relative: 9 %
Neutro Abs: 0.7 10*3/uL — ABNORMAL LOW (ref 1.7–7.7)
Neutrophils Relative %: 47 %
Platelet Count: 37 10*3/uL — ABNORMAL LOW (ref 150–400)
RBC: 3.48 MIL/uL — ABNORMAL LOW (ref 4.22–5.81)
RDW: 16.4 % — ABNORMAL HIGH (ref 11.5–15.5)
WBC Count: 1.4 10*3/uL — ABNORMAL LOW (ref 4.0–10.5)
nRBC: 0 % (ref 0.0–0.2)

## 2021-05-01 LAB — CMP (CANCER CENTER ONLY)
ALT: 9 U/L (ref 0–44)
AST: 10 U/L — ABNORMAL LOW (ref 15–41)
Albumin: 3.8 g/dL (ref 3.5–5.0)
Alkaline Phosphatase: 55 U/L (ref 38–126)
Anion gap: 8 (ref 5–15)
BUN: 20 mg/dL (ref 8–23)
CO2: 26 mmol/L (ref 22–32)
Calcium: 8.7 mg/dL — ABNORMAL LOW (ref 8.9–10.3)
Chloride: 107 mmol/L (ref 98–111)
Creatinine: 1.03 mg/dL (ref 0.61–1.24)
GFR, Estimated: >60 mL/min (ref >60)
Glucose, Bld: 132 mg/dL — ABNORMAL HIGH (ref 70–99)
Potassium: 3.6 mmol/L (ref 3.5–5.1)
Sodium: 141 mmol/L (ref 135–145)
Total Bilirubin: 0.5 mg/dL (ref 0.3–1.2)
Total Protein: 5.2 g/dL — ABNORMAL LOW (ref 6.5–8.1)

## 2021-05-01 MED ORDER — KETOROLAC TROMETHAMINE 30 MG/ML IJ SOLN: 30.0000 mg | Freq: Once | INTRAMUSCULAR | 0 refills | Status: DC

## 2021-05-01 MED ORDER — SODIUM CHLORIDE 0.9% FLUSH
10.0000 mL | Freq: Once | INTRAVENOUS | Status: AC
  Administered 2021-05-01: 10 mL via INTRAVENOUS
  Filled 2021-05-01: qty 10

## 2021-05-01 MED ORDER — TRAMADOL HCL 50 MG PO TABS: 50.0000 mg | ORAL_TABLET | Freq: Four times a day (QID) | ORAL | 1 refills | Status: DC | PRN

## 2021-05-01 MED ORDER — KETOROLAC TROMETHAMINE 60 MG/2ML IM SOLN
60.0000 mg | Freq: Once | INTRAMUSCULAR | Status: AC
  Administered 2021-05-01: 60 mg via INTRAMUSCULAR

## 2021-05-01 MED ORDER — KETOROLAC TROMETHAMINE 30 MG/ML IJ SOLN: 30.0000 mg | Freq: Once | INTRAMUSCULAR | Status: DC

## 2021-05-01 MED ORDER — KETOROLAC TROMETHAMINE 60 MG/2ML IM SOLN: 60.0000 mg | Freq: Once | INTRAMUSCULAR | Status: DC

## 2021-05-01 MED ORDER — TRAMADOL HCL 50 MG PO TABS: 50.0000 mg | ORAL_TABLET | Freq: Four times a day (QID) | ORAL | 1 refills | Status: AC | PRN

## 2021-05-01 MED ORDER — HEPARIN SOD (PORK) LOCK FLUSH 100 UNIT/ML IV SOLN
500.0000 [IU] | Freq: Once | INTRAVENOUS | Status: AC
  Administered 2021-05-01: 500 [IU] via INTRAVENOUS
  Filled 2021-05-01: qty 5

## 2021-05-01 NOTE — Patient Instructions (Signed)
Implanted Port Home Guide An implanted port is a device that is placed under the skin. It is usually placed in the chest. The device can be used to give IV medicine, to take blood, or for dialysis. You may have an implanted port if: You need IV medicine that would be irritating to the small veins in your hands or arms. You need IV medicines, such as antibiotics, for a long period of time. You need IV nutrition for a long period of time. You need dialysis. When you have a port, your health care provider can choose to use the port instead of veins in your arms for these procedures. You may have fewer limitations when using a port than you would if you used other types of long-term IVs, and you will likely be able to return to normal activities afteryour incision heals. An implanted port has two main parts: Reservoir. The reservoir is the part where a needle is inserted to give medicines or draw blood. The reservoir is round. After it is placed, it appears as a small, raised area under your skin. Catheter. The catheter is a thin, flexible tube that connects the reservoir to a vein. Medicine that is inserted into the reservoir goes into the catheter and then into the vein. How is my port accessed? To access your port: A numbing cream may be placed on the skin over the port site. Your health care provider will put on a mask and sterile gloves. The skin over your port will be cleaned carefully with a germ-killing soap and allowed to dry. Your health care provider will gently pinch the port and insert a needle into it. Your health care provider will check for a blood return to make sure the port is in the vein and is not clogged. If your port needs to remain accessed to get medicine continuously (constant infusion), your health care provider will place a clear bandage (dressing) over the needle site. The dressing and needle will need to be changed every week, or as told by your health care provider. What  is flushing? Flushing helps keep the port from getting clogged. Follow instructions from your health care provider about how and when to flush the port. Ports are usually flushed with saline solution or a medicine called heparin. The need for flushing will depend on how the port is used: If the port is only used from time to time to give medicines or draw blood, the port may need to be flushed: Before and after medicines have been given. Before and after blood has been drawn. As part of routine maintenance. Flushing may be recommended every 4-6 weeks. If a constant infusion is running, the port may not need to be flushed. Throw away any syringes in a disposal container that is meant for sharp items (sharps container). You can buy a sharps container from a pharmacy, or you can make one by using an empty hard plastic bottle with a cover. How long will my port stay implanted? The port can stay in for as long as your health care provider thinks it is needed. When it is time for the port to come out, a surgery will be done to remove it. The surgery will be similar to the procedure that was done to putthe port in. Follow these instructions at home:  Flush your port as told by your health care provider. If you need an infusion over several days, follow instructions from your health care provider about how to take   care of your port site. Make sure you: Wash your hands with soap and water before you change your dressing. If soap and water are not available, use alcohol-based hand sanitizer. Change your dressing as told by your health care provider. Place any used dressings or infusion bags into a plastic bag. Throw that bag in the trash. Keep the dressing that covers the needle clean and dry. Do not get it wet. Do not use scissors or sharp objects near the tube. Keep the tube clamped, unless it is being used. Check your port site every day for signs of infection. Check for: Redness, swelling, or  pain. Fluid or blood. Pus or a bad smell. Protect the skin around the port site. Avoid wearing bra straps that rub or irritate the site. Protect the skin around your port from seat belts. Place a soft pad over your chest if needed. Bathe or shower as told by your health care provider. The site may get wet as long as you are not actively receiving an infusion. Return to your normal activities as told by your health care provider. Ask your health care provider what activities are safe for you. Carry a medical alert card or wear a medical alert bracelet at all times. This will let health care providers know that you have an implanted port in case of an emergency. Get help right away if: You have redness, swelling, or pain at the port site. You have fluid or blood coming from your port site. You have pus or a bad smell coming from the port site. You have a fever. Summary Implanted ports are usually placed in the chest for long-term IV access. Follow instructions from your health care provider about flushing the port and changing bandages (dressings). Take care of the area around your port by avoiding clothing that puts pressure on the area, and by watching for signs of infection. Protect the skin around your port from seat belts. Place a soft pad over your chest if needed. Get help right away if you have a fever or you have redness, swelling, pain, drainage, or a bad smell at the port site. This information is not intended to replace advice given to you by your health care provider. Make sure you discuss any questions you have with your healthcare provider. Document Revised: 03/07/2020 Document Reviewed: 03/07/2020 Elsevier Patient Education  2022 Elsevier Inc.  

## 2021-05-01 NOTE — Progress Notes (Signed)
Established Patient Office Visit  Subjective:  Patient ID: Victor [name], male    DOB: Mar 30, 1936  Age: 85 y.o. MRN: [id number]  CC:  Chief Complaint  Patient presents with   Back Pain    Lower back pain   Hip Pain    Right & left hip pain    HPI Victor [name] presents for right lower back pain.  Longstanding history of lumbar radiculopathy with facet joint arthropathy.  Seems to be worse after his current round of chemotherapy for AML.  No recent injury.  Tramadol has helped in the past.  Nurse at a cancer center had written for Norco 5/325.  Pain is nonradiating.  Denies numbness or tingling changes in bowel or bladder function.  Denies muscle spasms.  Also has been having tingling in his right third fourth and fifth fingers.  He is right-hand dominant.  Denies increased time on the computer or repetitive motion with his wrist.  Denies neck pain.  Past Medical History:  Diagnosis Date   Arthritis    Atherosclerosis    Atherosclerotic PVD with intermittent claudication (HCC)    stent left leg   BPH (benign prostatic hyperplasia)    Bulging lumbar disc    Cancer ([place]) 11/12/14   vocal cord  carcinoma in situ , radiation; thyroid cancer   Carotid bruit    COPD (chronic obstructive pulmonary disease) (HCC)    DDD (degenerative disc disease), lumbar    Dysphonia    Dysplasia of true vocal cord    GERD (gastroesophageal reflux disease)    H/O carotid atherosclerosis    b/l   Hyperlipidemia    Hypothyroidism    Occasional tremors    left hand managed with propranolol   Peripheral vascular angioplasty status with implants and grafts    Precancerous lesion 03/05/2018   premelanoma removed from back.    Radiation 03/10/15- 04/18/16   Larynx   Sleep apnea    hasn't used CPAP in years   Spondylosis of lumbosacral region    Thyroid disease     Past Surgical History:  Procedure Laterality Date   APPENDECTOMY     COLONOSCOPY W/ POLYPECTOMY     DIODE LASER APPLICATION Left  21/62/4469   Procedure: DIODE LASER APPLICATION;  Surgeon: Hayden Pedro, MD;  Location: [place];  Service: Ophthalmology;  Laterality: Left;   IR IMAGING GUIDED PORT INSERTION  01/16/2021   IR IVC FILTER PLMT / S&I /IMG GUID/MOD SED  03/17/2021   MEMBRANE PEEL Left 08/18/2019   Procedure: MEMBRANE PEEL;  Surgeon: Hayden Pedro, MD;  Location: [place];  Service: Ophthalmology;  Laterality: Left;   MOUTH SURGERY     tooth extraction   PARS PLANA VITRECTOMY Left 08/18/2019   PARS PLANA VITRECTOMY 27 GAUGE (Left)   PARS PLANA VITRECTOMY 27 GAUGE Left 08/18/2019   Procedure: PARS PLANA VITRECTOMY 27 GAUGE;  Surgeon: Hayden Pedro, MD;  Location: [place];  Service: Ophthalmology;  Laterality: Left;   PHOTOCOAGULATION WITH LASER Left 08/18/2019   Procedure: PHOTOCOAGULATION WITH LASER;  Surgeon: Hayden Pedro, MD;  Location: [place];  Service: Ophthalmology;  Laterality: Left;   THYROID SURGERY     TONSILLECTOMY     vocal cord biopsy  11/12/14   squamous cell carcinoma in situ    Family History  Problem Relation Age of Onset   Colon cancer Father    Cancer Sister        Died of metastatic cancer  Leukemia Neg Hx     Social History   Socioeconomic History   Marital status: Married    Spouse name: Not on file   Number of children: 2   Years of education: Not on file   Highest education level: Not on file  Occupational History   Not on file  Tobacco Use   Smoking status: Former    Packs/day: 2.00    Years: 51.00    Pack years: 102.00    Types: Cigarettes    Start date: 75    Quit date: 03/05/2004    Years since quitting: 17.1   Smokeless tobacco: Never  Vaping Use   Vaping Use: Never used  Substance and Sexual Activity   Alcohol use: Yes    Alcohol/week: 3.0 standard drinks    Types: 3 Glasses of wine per week    Comment: occ   Drug use: No   Sexual activity: Not on file  Other Topics Concern   Not on file  Social History Narrative   Not on file   Social  Determinants of Health   Financial Resource Strain: Not on file  Food Insecurity: Not on file  Transportation Needs: Not on file  Physical Activity: Not on file  Stress: Not on file  Social Connections: Not on file  Intimate Partner Violence: Not on file    Outpatient Medications Prior to Visit  Medication Sig Dispense Refill   acetaminophen (TYLENOL) 500 MG tablet Take 1,000 mg by mouth every 6 (six) hours as needed for moderate pain.     acyclovir (ZOVIRAX) 400 MG tablet Take 1 tablet (400 mg total) by mouth 2 (two) times daily as needed. 60 tablet 2   apixaban (ELIQUIS) 5 MG TABS tablet Take 1 tablet (5 mg total) by mouth 2 (two) times daily. 60 tablet 3   doxazosin (CARDURA) 2 MG tablet Take 1 tablet (2 mg total) by mouth at bedtime. 90 tablet 3   fluticasone (FLONASE) 50 MCG/ACT nasal spray Place 2 sprays into the nose 2 (two) times daily as needed for allergies.     levothyroxine (SYNTHROID) 175 MCG tablet Take 175 mcg by mouth daily.     lidocaine-prilocaine (EMLA) cream Apply 1 application topically as needed for up to 30 doses. 30 g 0   Loratadine (CLARITIN PO) Take 10 mg by mouth at bedtime. Allertec     Melatonin 5 MG CHEW Chew 1 tablet by mouth daily as needed (sleep).     omeprazole (PRILOSEC) 20 MG capsule TAKE 1 CAPSULE BY MOUTH TWICE A DAY BEFORE A MEAL 180 capsule 0   potassium chloride (MICRO-K) 10 MEQ CR capsule Take 10 mEq by mouth in the morning and at bedtime.     propranolol ER (INDERAL LA) 80 MG 24 hr capsule Take 1 capsule (80 mg total) by mouth daily. 90 capsule 3   simvastatin (ZOCOR) 40 MG tablet TAKE ONE TABLET BY MOUTH DAILY AT BEDTIME (Patient taking differently: Take 40 mg by mouth daily at 6 PM.) 90 tablet 3   HYDROcodone-acetaminophen (NORCO/VICODIN) 5-325 MG tablet Take 1 tablet by mouth every 6 (six) hours as needed for moderate pain.     traMADol (ULTRAM) 50 MG tablet Take by mouth every 6 (six) hours as needed.     fluconazole (DIFLUCAN) 200 MG tablet  Take 200 mg by mouth daily. (Patient not taking: Reported on 05/01/2021)     hydrALAZINE (APRESOLINE) 25 MG tablet Take 1 tablet (25 mg total) by mouth 3 (three)  times daily as needed. Take if standing BP >140/80 mm Hg 90 tablet 2   levofloxacin (LEVAQUIN) 500 MG tablet Take 500 mg by mouth daily. (Patient not taking: Reported on 05/01/2021)     venetoclax 100 MG TABS Take 100 mg by mouth in the morning, at noon, in the evening, and at bedtime. (Patient not taking: Reported on 05/01/2021)     No facility-administered medications prior to visit.    No Known Allergies  ROS Review of Systems  Constitutional:  Positive for fatigue. Negative for chills and fever.  Respiratory: Negative.    Cardiovascular: Negative.   Gastrointestinal: Negative.   Genitourinary: Negative.   Musculoskeletal:  Positive for back pain. Negative for myalgias, neck pain and neck stiffness.  Neurological:  Positive for numbness. Negative for weakness.  Psychiatric/Behavioral: Negative.       Objective:    Physical Exam Vitals and nursing note reviewed.  Constitutional:      General: He is not in acute distress.    Appearance: He is ill-appearing. He is not toxic-appearing or diaphoretic.  HENT:     Head: Normocephalic and atraumatic.  Eyes:     General: No scleral icterus.       Right eye: No discharge.        Left eye: No discharge.     Conjunctiva/sclera: Conjunctivae normal.  Pulmonary:     Effort: Pulmonary effort is normal.  Musculoskeletal:     Right wrist: No deformity or tenderness. Normal range of motion.       Arms:     Cervical back: No signs of trauma, rigidity, spasms or bony tenderness. No pain with movement. Decreased range of motion.     Lumbar back: No spasms, tenderness or bony tenderness. Decreased range of motion. Negative right straight leg raise test and negative left straight leg raise test.       Back:  Neurological:     Mental Status: He is alert and oriented to person, place,  and time.     Motor: No weakness.     Deep Tendon Reflexes:     Reflex Scores:      Tricep reflexes are 0 on the right side and 0 on the left side.      Bicep reflexes are 1+ on the right side and 1+ on the left side.      Brachioradialis reflexes are 1+ on the right side and 1+ on the left side.      Patellar reflexes are 1+ on the right side and 1+ on the left side.      Achilles reflexes are 1+ on the right side and 1+ on the left side. Psychiatric:        Mood and Affect: Mood normal.        Behavior: Behavior normal.    BP 122/78   Pulse 82   Temp 98.4 F (36.9 C)   Ht _0  (1.778 m)   Wt 209 lb 12.8 oz (95.2 kg)   SpO2 95%   BMI 30.10 kg/m  Wt Readings from Last 3 Encounters:  05/01/21 209 lb 12.8 oz (95.2 kg)  04/20/21 210 lb (95.3 kg)  04/17/21 202 lb (91.6 kg)     Health Maintenance Due  Topic Date Due   Zoster Vaccines- Shingrix (2 of 2) 11/11/2017   COVID-19 Vaccine (4 - Booster for Pfizer series) 03/12/2021    There are no preventive care reminders to display for this patient.  Lab Results  Component Value  Date   TSH 2.94 09/17/2019   Lab Results  Component Value Date   WBC 1.4 (L) 05/01/2021   HGB 11.1 (L) 05/01/2021   HCT 34.2 (L) 05/01/2021   MCV 98.3 05/01/2021   PLT 37 (L) 05/01/2021   Lab Results  Component Value Date   NA 141 05/01/2021   K 3.6 05/01/2021   CHLORIDE 105 03/30/2015   CO2 26 05/01/2021   GLUCOSE 132 (H) 05/01/2021   BUN 20 05/01/2021   CREATININE 1.03 05/01/2021   BILITOT 0.5 05/01/2021   ALKPHOS 55 05/01/2021   AST 10 (L) 05/01/2021   ALT 9 05/01/2021   PROT 5.2 (L) 05/01/2021   ALBUMIN 3.8 05/01/2021   CALCIUM 8.7 (L) 05/01/2021   ANIONGAP 8 05/01/2021   EGFR 57 (L) 03/30/2015   GFR 67.69 09/17/2019   Lab Results  Component Value Date   CHOL 157 03/23/2021   Lab Results  Component Value Date   HDL 34 (L) 03/23/2021   Lab Results  Component Value Date   LDLCALC 99 03/23/2021   Lab Results   Component Value Date   TRIG 132 03/23/2021   Lab Results  Component Value Date   CHOLHDL 4 11/12/2018   No results found for: HGBA1C    Assessment & Plan:   Problem List Items Addressed This Visit       Musculoskeletal and Integument   Facet arthritis of lumbar region - Primary   Relevant Medications   traMADol (ULTRAM) 50 MG tablet   Other Relevant Orders   DG Lumbar Spine Complete   Other Visit Diagnoses     Neuropathy of right median sensory nerve       Relevant Orders   Ambulatory referral to Sports Medicine       Meds ordered this encounter  Medications   DISCONTD: ketorolac (TORADOL) injection 60 mg   DISCONTD: traMADol (ULTRAM) 50 MG tablet    Sig: Take 1 tablet (50 mg total) by mouth every 6 (six) hours as needed.    Dispense:  120 tablet    Refill:  1   traMADol (ULTRAM) 50 MG tablet    Sig: Take 1 tablet (50 mg total) by mouth every 6 (six) hours as needed.    Dispense:  120 tablet    Refill:  1   DISCONTD: ketorolac (TORADOL) 30 MG/ML injection    Sig: Inject 1 mL (30 mg total) into the vein once for 1 dose.    Dispense:  1 mL    Refill:  0   DISCONTD: ketorolac (TORADOL) 30 MG/ML injection 30 mg   ketorolac (TORADOL) injection 60 mg    Follow-up: Return if symptoms worsen or fail to improve.    [name], MD

## 2021-05-01 NOTE — Telephone Encounter (Signed)
New message    Pharmacy calling medication Ketorolac Tromethamine.  One dosage is not available    Asking for a call back to discuss.

## 2021-05-03 ENCOUNTER — Ambulatory Visit (INDEPENDENT_AMBULATORY_CARE_PROVIDER_SITE_OTHER)
Admission: RE | Admit: 2021-05-03 | Discharge: 2021-05-03 | Disposition: A | Discharge status: Home / Self Care | Payer: Medicare Other | Source: Ambulatory Visit | Attending: Family Medicine | Admitting: Family Medicine

## 2021-05-03 ENCOUNTER — Other Ambulatory Visit: Payer: Self-pay

## 2021-05-03 DIAGNOSIS — M47816 Spondylosis without myelopathy or radiculopathy, lumbar region: Secondary | ICD-10-CM | POA: Diagnosis not present

## 2021-05-04 ENCOUNTER — Other Ambulatory Visit: Payer: Self-pay | Admitting: Family Medicine

## 2021-05-04 ENCOUNTER — Ambulatory Visit (INDEPENDENT_AMBULATORY_CARE_PROVIDER_SITE_OTHER): Payer: Medicare Other | Admitting: Family Medicine

## 2021-05-04 ENCOUNTER — Encounter: Payer: Self-pay | Admitting: Family Medicine

## 2021-05-04 VITALS — BP 130/60 | Ht 70.0 in | Wt 209.0 lb

## 2021-05-04 DIAGNOSIS — M47817 Spondylosis without myelopathy or radiculopathy, lumbosacral region: Secondary | ICD-10-CM | POA: Diagnosis not present

## 2021-05-04 DIAGNOSIS — G5623 Lesion of ulnar nerve, bilateral upper limbs: Secondary | ICD-10-CM | POA: Diagnosis not present

## 2021-05-04 MED ORDER — PREDNISONE 5 MG PO TABS: ORAL_TABLET | ORAL | 0 refills | Status: DC

## 2021-05-04 MED ORDER — LEVOTHYROXINE SODIUM 175 MCG PO TABS: 175.0000 ug | ORAL_TABLET | Freq: Every day | ORAL | 1 refills | Status: DC

## 2021-05-04 NOTE — Assessment & Plan Note (Signed)
His symptoms predominantly are synergistic in the ulnar aspect of the hand.  Seems less likely to be carpal tunnel given the distribution of the third fourth and fifth digits. -Counseled on home exercise therapy and supportive care. -Prednisone. -Counseled on adjusting his sleep. -Could consider injection.

## 2021-05-04 NOTE — Telephone Encounter (Signed)
Patient is requesting a refill on his Levothyroxine 175Mcg (prescribed in hospital). If approved, please send to [facility] on [address] and call patient at [phone] to let him know that it has been sent in.

## 2021-05-04 NOTE — Progress Notes (Signed)
Victor ORE - 85 y.o. male MRN [id number]  Date of birth: 01-02-1936  SUBJECTIVE:  Including CC & ROS.  No chief complaint on file.   Victor [name] is a 85 y.o. male that is presenting with acute low back pain and altered sensation of the third fourth and fifth digit of the right left hand.  The pain in the back is severe in nature.  We will catch him from time to time.  Feels different from his previous left hip pain..  Independent review of the lumbar spine x-ray from 6/29 shows facet arthrosis.   Review of Systems See HPI   HISTORY: Past Medical, Surgical, Social, and Family History Reviewed & Updated per EMR.   Pertinent Historical Findings include:  Past Medical History:  Diagnosis Date   Arthritis    Atherosclerosis    Atherosclerotic PVD with intermittent claudication (HCC)    stent left leg   BPH (benign prostatic hyperplasia)    Bulging lumbar disc    Cancer ([place]) 11/12/14   vocal cord  carcinoma in situ , radiation; thyroid cancer   Carotid bruit    COPD (chronic obstructive pulmonary disease) (HCC)    DDD (degenerative disc disease), lumbar    Dysphonia    Dysplasia of true vocal cord    GERD (gastroesophageal reflux disease)    H/O carotid atherosclerosis    b/l   Hyperlipidemia    Hypothyroidism    Occasional tremors    left hand managed with propranolol   Peripheral vascular angioplasty status with implants and grafts    Precancerous lesion 03/05/2018   premelanoma removed from back.    Radiation 03/10/15- 04/18/16   Larynx   Sleep apnea    hasn't used CPAP in years   Spondylosis of lumbosacral region    Thyroid disease     Past Surgical History:  Procedure Laterality Date   APPENDECTOMY     COLONOSCOPY W/ POLYPECTOMY     DIODE LASER APPLICATION Left 30/86/5784   Procedure: DIODE LASER APPLICATION;  Surgeon: Hayden Pedro, MD;  Location: [place];  Service: Ophthalmology;  Laterality: Left;   IR IMAGING GUIDED PORT INSERTION  01/16/2021   IR IVC  FILTER PLMT / S&I /IMG GUID/MOD SED  03/17/2021   MEMBRANE PEEL Left 08/18/2019   Procedure: MEMBRANE PEEL;  Surgeon: Hayden Pedro, MD;  Location: [place];  Service: Ophthalmology;  Laterality: Left;   MOUTH SURGERY     tooth extraction   PARS PLANA VITRECTOMY Left 08/18/2019   PARS PLANA VITRECTOMY 27 GAUGE (Left)   PARS PLANA VITRECTOMY 27 GAUGE Left 08/18/2019   Procedure: PARS PLANA VITRECTOMY 27 GAUGE;  Surgeon: Hayden Pedro, MD;  Location: [place];  Service: Ophthalmology;  Laterality: Left;   PHOTOCOAGULATION WITH LASER Left 08/18/2019   Procedure: PHOTOCOAGULATION WITH LASER;  Surgeon: Hayden Pedro, MD;  Location: [place];  Service: Ophthalmology;  Laterality: Left;   THYROID SURGERY     TONSILLECTOMY     vocal cord biopsy  11/12/14   squamous cell carcinoma in situ    Family History  Problem Relation Age of Onset   Colon cancer Father    Cancer Sister        Died of metastatic cancer   Leukemia Neg Hx     Social History   Socioeconomic History   Marital status: Married    Spouse name: Not on file   Number of children: 2   Years of education: Not on file  Highest education level: Not on file  Occupational History   Not on file  Tobacco Use   Smoking status: Former    Packs/day: 2.00    Years: 51.00    Pack years: 102.00    Types: Cigarettes    Start date: 89    Quit date: 03/05/2004    Years since quitting: 17.1   Smokeless tobacco: Never  Vaping Use   Vaping Use: Never used  Substance and Sexual Activity   Alcohol use: Yes    Alcohol/week: 3.0 standard drinks    Types: 3 Glasses of wine per week    Comment: occ   Drug use: No   Sexual activity: Not on file  Other Topics Concern   Not on file  Social History Narrative   Not on file   Social Determinants of Health   Financial Resource Strain: Not on file  Food Insecurity: Not on file  Transportation Needs: Not on file  Physical Activity: Not on file  Stress: Not on file  Social Connections:  Not on file  Intimate Partner Violence: Not on file     PHYSICAL EXAM:  VS: BP 130/60 (BP Location: Left Arm, Patient Position: Sitting, Cuff Size: Large)   Ht 5\' 10"  (1.778 m)   Wt 209 lb (94.8 kg)   BMI 29.99 kg/m  Physical Exam Gen: NAD, alert, cooperative with exam, well-appearing MSK:  Back: Limited range of motion. Negative straight leg raise. Neurovascularly intact     ASSESSMENT & PLAN:   Facet arthritis of lumbosacral region Symptoms seem most consistent with facet joint as opposed to his previous pain experience of the SI joint. -Counseled on home exercise therapy and supportive care. -Counseled on bracing. -Prednisone. -Could consider physical therapy.  Ulnar neuropathy of both upper extremities His symptoms predominantly are synergistic in the ulnar aspect of the hand.  Seems less likely to be carpal tunnel given the distribution of the third fourth and fifth digits. -Counseled on home exercise therapy and supportive care. -Prednisone. -Counseled on adjusting his sleep. -Could consider injection.

## 2021-05-04 NOTE — Telephone Encounter (Signed)
Rx filled and sent

## 2021-05-04 NOTE — Telephone Encounter (Signed)
Last fill 08/15/20 Historical Provider Last OV 05/01/21

## 2021-05-04 NOTE — Patient Instructions (Signed)
Good to see you Please try the exercises  Please try the brace   Please send me a message in MyChart with any questions or updates.  Please see me back in 2 weeks.   --Dr. [name]

## 2021-05-04 NOTE — Assessment & Plan Note (Signed)
Symptoms seem most consistent with facet joint as opposed to his previous pain experience of the SI joint. -Counseled on home exercise therapy and supportive care. -Counseled on bracing. -Prednisone. -Could consider physical therapy.

## 2021-05-09 ENCOUNTER — Inpatient Hospital Stay: Payer: Medicare Other

## 2021-05-09 ENCOUNTER — Other Ambulatory Visit: Payer: Self-pay

## 2021-05-09 ENCOUNTER — Inpatient Hospital Stay: Payer: Medicare Other | Attending: Oncology

## 2021-05-09 ENCOUNTER — Other Ambulatory Visit: Payer: Self-pay | Admitting: Hematology & Oncology

## 2021-05-09 ENCOUNTER — Ambulatory Visit (HOSPITAL_BASED_OUTPATIENT_CLINIC_OR_DEPARTMENT_OTHER)
Admission: RE | Admit: 2021-05-09 | Discharge: 2021-05-09 | Disposition: A | Discharge status: Home / Self Care | Payer: Medicare Other | Source: Ambulatory Visit | Attending: Hematology & Oncology | Admitting: Hematology & Oncology

## 2021-05-09 ENCOUNTER — Encounter: Payer: Self-pay | Admitting: *Deleted

## 2021-05-09 VITALS — BP 128/59 | HR 72 | Temp 97.9°F | Resp 18

## 2021-05-09 DIAGNOSIS — Z79899 Other long term (current) drug therapy: Secondary | ICD-10-CM | POA: Diagnosis not present

## 2021-05-09 DIAGNOSIS — C9500 Acute leukemia of unspecified cell type not having achieved remission: Secondary | ICD-10-CM

## 2021-05-09 DIAGNOSIS — I2699 Other pulmonary embolism without acute cor pulmonale: Secondary | ICD-10-CM | POA: Diagnosis present

## 2021-05-09 DIAGNOSIS — Z95828 Presence of other vascular implants and grafts: Secondary | ICD-10-CM

## 2021-05-09 DIAGNOSIS — Z5111 Encounter for antineoplastic chemotherapy: Secondary | ICD-10-CM | POA: Insufficient documentation

## 2021-05-09 DIAGNOSIS — C9200 Acute myeloblastic leukemia, not having achieved remission: Secondary | ICD-10-CM | POA: Diagnosis present

## 2021-05-09 LAB — CMP (CANCER CENTER ONLY)
ALT: 8 U/L (ref 0–44)
AST: 9 U/L — ABNORMAL LOW (ref 15–41)
Albumin: 3.9 g/dL (ref 3.5–5.0)
Alkaline Phosphatase: 55 U/L (ref 38–126)
Anion gap: 8 (ref 5–15)
BUN: 19 mg/dL (ref 8–23)
CO2: 28 mmol/L (ref 22–32)
Calcium: 8.7 mg/dL — ABNORMAL LOW (ref 8.9–10.3)
Chloride: 105 mmol/L (ref 98–111)
Creatinine: 1 mg/dL (ref 0.61–1.24)
GFR, Estimated: >60 mL/min (ref >60)
Glucose, Bld: 110 mg/dL — ABNORMAL HIGH (ref 70–99)
Potassium: 3.4 mmol/L — ABNORMAL LOW (ref 3.5–5.1)
Sodium: 141 mmol/L (ref 135–145)
Total Bilirubin: 0.5 mg/dL (ref 0.3–1.2)
Total Protein: 5.2 g/dL — ABNORMAL LOW (ref 6.5–8.1)

## 2021-05-09 LAB — CBC WITH DIFFERENTIAL (CANCER CENTER ONLY)
Abs Immature Granulocytes: 0.03 10*3/uL (ref 0.00–0.07)
Basophils Absolute: 0 10*3/uL (ref 0.0–0.1)
Basophils Relative: 2 %
Eosinophils Absolute: 0 10*3/uL (ref 0.0–0.5)
Eosinophils Relative: 0 %
HCT: 33.8 % — ABNORMAL LOW (ref 39.0–52.0)
Hemoglobin: 11.1 g/dL — ABNORMAL LOW (ref 13.0–17.0)
Immature Granulocytes: 2 %
Lymphocytes Relative: 31 %
Lymphs Abs: 0.6 10*3/uL — ABNORMAL LOW (ref 0.7–4.0)
MCH: 31.7 pg (ref 26.0–34.0)
MCHC: 32.8 g/dL (ref 30.0–36.0)
MCV: 96.6 fL (ref 80.0–100.0)
Monocytes Absolute: 0.4 10*3/uL (ref 0.1–1.0)
Monocytes Relative: 19 %
Neutro Abs: 0.9 10*3/uL — ABNORMAL LOW (ref 1.7–7.7)
Neutrophils Relative %: 46 %
Platelet Count: 76 10*3/uL — ABNORMAL LOW (ref 150–400)
RBC: 3.5 MIL/uL — ABNORMAL LOW (ref 4.22–5.81)
RDW: 16.1 % — ABNORMAL HIGH (ref 11.5–15.5)
WBC Count: 2 10*3/uL — ABNORMAL LOW (ref 4.0–10.5)
nRBC: 1 % — ABNORMAL HIGH (ref 0.0–0.2)

## 2021-05-09 MED ORDER — SODIUM CHLORIDE 0.9% FLUSH
10.0000 mL | INTRAVENOUS | Status: DC | PRN
  Administered 2021-05-09: 10 mL via INTRAVENOUS
  Filled 2021-05-09: qty 10

## 2021-05-09 MED ORDER — APIXABAN 2.5 MG PO TABS: 2.5000 mg | ORAL_TABLET | Freq: Two times a day (BID) | ORAL | 6 refills | Status: DC

## 2021-05-09 MED ORDER — HEPARIN SOD (PORK) LOCK FLUSH 100 UNIT/ML IV SOLN
500.0000 [IU] | Freq: Once | INTRAVENOUS | Status: DC
  Filled 2021-05-09: qty 5

## 2021-05-09 MED ORDER — SODIUM CHLORIDE 0.9% FLUSH
10.0000 mL | Freq: Once | INTRAVENOUS | Status: DC
  Filled 2021-05-09: qty 10

## 2021-05-09 MED ORDER — HEPARIN SOD (PORK) LOCK FLUSH 100 UNIT/ML IV SOLN
500.0000 [IU] | Freq: Once | INTRAVENOUS | Status: AC
Start: 2021-05-09 — End: 2021-05-09
  Administered 2021-05-09: 500 [IU] via INTRAVENOUS
  Filled 2021-05-09: qty 5

## 2021-05-10 ENCOUNTER — Ambulatory Visit: Payer: Medicare Other | Admitting: Family Medicine

## 2021-05-15 ENCOUNTER — Other Ambulatory Visit: Payer: Medicare Other

## 2021-05-15 ENCOUNTER — Inpatient Hospital Stay: Payer: Medicare Other

## 2021-05-15 ENCOUNTER — Inpatient Hospital Stay (HOSPITAL_BASED_OUTPATIENT_CLINIC_OR_DEPARTMENT_OTHER): Payer: Medicare Other | Admitting: Hematology & Oncology

## 2021-05-15 ENCOUNTER — Encounter: Payer: Self-pay | Admitting: Hematology & Oncology

## 2021-05-15 ENCOUNTER — Other Ambulatory Visit: Payer: Self-pay

## 2021-05-15 VITALS — BP 123/44 | HR 82 | Temp 97.9°F | Resp 17 | Wt 206.0 lb

## 2021-05-15 DIAGNOSIS — C969 Malignant neoplasm of lymphoid, hematopoietic and related tissue, unspecified: Secondary | ICD-10-CM

## 2021-05-15 DIAGNOSIS — Z5111 Encounter for antineoplastic chemotherapy: Secondary | ICD-10-CM | POA: Diagnosis not present

## 2021-05-15 DIAGNOSIS — C9500 Acute leukemia of unspecified cell type not having achieved remission: Secondary | ICD-10-CM

## 2021-05-15 DIAGNOSIS — Z95828 Presence of other vascular implants and grafts: Secondary | ICD-10-CM

## 2021-05-15 DIAGNOSIS — M25579 Pain in unspecified ankle and joints of unspecified foot: Secondary | ICD-10-CM

## 2021-05-15 LAB — CBC WITH DIFFERENTIAL (CANCER CENTER ONLY)
Abs Immature Granulocytes: 0.08 10*3/uL — ABNORMAL HIGH (ref 0.00–0.07)
Basophils Absolute: 0 10*3/uL (ref 0.0–0.1)
Basophils Relative: 1 %
Eosinophils Absolute: 0 10*3/uL (ref 0.0–0.5)
Eosinophils Relative: 0 %
HCT: 38.3 % — ABNORMAL LOW (ref 39.0–52.0)
Hemoglobin: 12.5 g/dL — ABNORMAL LOW (ref 13.0–17.0)
Immature Granulocytes: 2 %
Lymphocytes Relative: 22 %
Lymphs Abs: 0.8 10*3/uL (ref 0.7–4.0)
MCH: 31.5 pg (ref 26.0–34.0)
MCHC: 32.6 g/dL (ref 30.0–36.0)
MCV: 96.5 fL (ref 80.0–100.0)
Monocytes Absolute: 0.7 10*3/uL (ref 0.1–1.0)
Monocytes Relative: 19 %
Neutro Abs: 2.2 10*3/uL (ref 1.7–7.7)
Neutrophils Relative %: 56 %
Platelet Count: 91 10*3/uL — ABNORMAL LOW (ref 150–400)
RBC: 3.97 MIL/uL — ABNORMAL LOW (ref 4.22–5.81)
RDW: 16.2 % — ABNORMAL HIGH (ref 11.5–15.5)
WBC Count: 3.8 10*3/uL — ABNORMAL LOW (ref 4.0–10.5)
nRBC: 0 % (ref 0.0–0.2)

## 2021-05-15 LAB — CMP (CANCER CENTER ONLY)
ALT: 8 U/L (ref 0–44)
AST: 10 U/L — ABNORMAL LOW (ref 15–41)
Albumin: 4 g/dL (ref 3.5–5.0)
Alkaline Phosphatase: 70 U/L (ref 38–126)
Anion gap: 7 (ref 5–15)
BUN: 17 mg/dL (ref 8–23)
CO2: 27 mmol/L (ref 22–32)
Calcium: 8.6 mg/dL — ABNORMAL LOW (ref 8.9–10.3)
Chloride: 104 mmol/L (ref 98–111)
Creatinine: 1.02 mg/dL (ref 0.61–1.24)
GFR, Estimated: >60 mL/min (ref >60)
Glucose, Bld: 125 mg/dL — ABNORMAL HIGH (ref 70–99)
Potassium: 3.9 mmol/L (ref 3.5–5.1)
Sodium: 138 mmol/L (ref 135–145)
Total Bilirubin: 0.7 mg/dL (ref 0.3–1.2)
Total Protein: 5.5 g/dL — ABNORMAL LOW (ref 6.5–8.1)

## 2021-05-15 LAB — LACTATE DEHYDROGENASE: LDH: 173 U/L (ref 98–192)

## 2021-05-15 LAB — SAVE SMEAR(SSMR), FOR PROVIDER SLIDE REVIEW

## 2021-05-15 MED ORDER — KETOROLAC TROMETHAMINE 10 MG PO TABS: 10.0000 mg | ORAL_TABLET | Freq: Four times a day (QID) | ORAL | 0 refills | Status: DC | PRN

## 2021-05-15 MED ORDER — KETOROLAC TROMETHAMINE 15 MG/ML IJ SOLN
INTRAMUSCULAR | Status: AC
  Filled 2021-05-15: qty 2

## 2021-05-15 MED ORDER — KETOROLAC TROMETHAMINE 15 MG/ML IJ SOLN
30.0000 mg | Freq: Once | INTRAMUSCULAR | Status: AC
  Administered 2021-05-15: 30 mg via INTRAVENOUS
  Filled 2021-05-15: qty 2

## 2021-05-15 MED ORDER — KETOROLAC TROMETHAMINE 30 MG/ML IJ SOLN
30.0000 mg | Freq: Once | INTRAMUSCULAR | Status: AC
Start: 2021-05-15 — End: 2021-05-20
  Filled 2021-05-15: qty 1

## 2021-05-15 MED ORDER — HEPARIN SOD (PORK) LOCK FLUSH 100 UNIT/ML IV SOLN
500.0000 [IU] | Freq: Once | INTRAVENOUS | Status: AC
  Administered 2021-05-15: 500 [IU] via INTRAVENOUS
  Filled 2021-05-15: qty 5

## 2021-05-15 MED ORDER — SODIUM CHLORIDE 0.9% FLUSH
10.0000 mL | Freq: Once | INTRAVENOUS | Status: AC
  Administered 2021-05-15: 10 mL via INTRAVENOUS
  Filled 2021-05-15: qty 10

## 2021-05-15 NOTE — Progress Notes (Signed)
Hematology and Oncology Follow Up Visit  Victor [name] [id number] 06-13-1936 85 y.o. 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 twice daily --started on 03/16/2021     Interim History:  Victor [name] is back for follow-up.  He is having some lower back issues.  He sees his family doctor.  Hopefully, he will be able to help out.  I know that Toradol that we gave him here does seem to help.  We did do a CT angiogram on him last month.  This was of the chest.  There is no pulmonary emboli that could be noted.  Did a Doppler of his right leg.  There is no thrombus noted in the right leg.  We will go ahead and decrease his Eliquis down to 2.5 mg p.o. twice daily.  His platelet count is trending upward nicely.  Hopefully, this is a good sign that the leukemia that he had is responding and will continue to respond to treatment.  He has had no issues with fever.  He has had no bleeding.  He has had no change in bowel or bladder habits.  He has had no cough.  He has had no leg swelling.  He has had no mouth sores.  His appetite has been doing quite good.  Overall, I would say his performance status is probably ECOG 1.    Medications:  Current Outpatient Medications:    acetaminophen (TYLENOL) 500 MG tablet, Take 1,000 mg by mouth every 6 (six) hours as needed for moderate pain., Disp: , Rfl:    acyclovir (ZOVIRAX) 400 MG tablet, Take 1 tablet (400 mg total) by mouth 2 (two) times daily as needed., Disp: 60 tablet, Rfl: 2   apixaban (ELIQUIS) 2.5 MG TABS tablet, Take 1 tablet (2.5 mg total) by mouth 2 (two) times daily., Disp: 60 tablet, Rfl: 6   doxazosin (CARDURA) 2 MG tablet, Take 1 tablet (2 mg total) by mouth at bedtime., Disp: 90 tablet, Rfl: 3   fluticasone (FLONASE) 50 MCG/ACT nasal spray, Place 2 sprays into the nose 2  (two) times daily as needed for allergies., Disp: , Rfl:    HYDROcodone-acetaminophen (NORCO/VICODIN) 5-325 MG tablet, Take 1 tablet by mouth every 6 (six) hours as needed for moderate pain., Disp: , Rfl:    levothyroxine (SYNTHROID) 175 MCG tablet, Take 1 tablet (175 mcg total) by mouth daily., Disp: 90 tablet, Rfl: 1   Lidocaine (SALONPAS PAIN RELIEVING) 4 % PTCH, Apply topically. Uses 1/2 patch daily., Disp: , Rfl:    lidocaine-prilocaine (EMLA) cream, Apply 1 application topically as needed for up to 30 doses., Disp: 30 g, Rfl: 0   Loratadine (CLARITIN PO), Take 10 mg by mouth at bedtime. Allertec, Disp: , Rfl:    Melatonin 5 MG CHEW, Chew 1 tablet by mouth daily as needed (sleep)., Disp: , Rfl:    omeprazole (PRILOSEC) 20 MG capsule, TAKE 1 CAPSULE BY MOUTH TWICE A DAY BEFORE A MEAL, Disp: 180 capsule, Rfl: 0   potassium chloride (MICRO-K) 10 MEQ CR capsule, Take 10 mEq by mouth in the morning and at bedtime., Disp: , Rfl:    propranolol ER (INDERAL LA) 80 MG 24 hr capsule, TAKE ONE CAPSULE BY MOUTH ONE TIME DAILY, Disp: 90 capsule, Rfl: 0   simvastatin (ZOCOR) 40 MG tablet, TAKE ONE TABLET BY MOUTH DAILY AT BEDTIME (Patient  taking differently: Take 40 mg by mouth daily at 6 PM.), Disp: 90 tablet, Rfl: 3   traMADol (ULTRAM) 50 MG tablet, Take 1 tablet (50 mg total) by mouth every 6 (six) hours as needed., Disp: 120 tablet, Rfl: 1   fluconazole (DIFLUCAN) 200 MG tablet, Take 200 mg by mouth daily. (Patient not taking: No sig reported), Disp: , Rfl:    hydrALAZINE (APRESOLINE) 25 MG tablet, Take 1 tablet (25 mg total) by mouth 3 (three) times daily as needed. Take if standing BP >140/80 mm Hg, Disp: 90 tablet, Rfl: 2   levofloxacin (LEVAQUIN) 500 MG tablet, Take 500 mg by mouth daily. (Patient not taking: No sig reported), Disp: , Rfl:    predniSONE (DELTASONE) 5 MG tablet, Take 6 pills for first day, 5 pills second day, 4 pills third day, 3 pills fourth day, 2 pills the fifth day, and 1 pill sixth  day., Disp: 21 tablet, Rfl: 0   venetoclax 100 MG TABS, Take 100 mg by mouth in the morning, at noon, in the evening, and at bedtime. (Patient not taking: No sig reported), Disp: , Rfl:   Allergies: No Known Allergies  Past Medical History, Surgical history, Social history, and Family History were reviewed and updated.  Review of Systems: Review of Systems  Constitutional: Negative.   HENT:  Negative.    Eyes: Negative.   Respiratory: Negative.    Cardiovascular: Negative.   Gastrointestinal: Negative.   Endocrine: Negative.   Genitourinary: Negative.    Musculoskeletal: Negative.   Skin: Negative.   Neurological: Negative.   Hematological: Negative.   Psychiatric/Behavioral: Negative.     Physical Exam:  weight is 206 lb (93.4 kg). His oral temperature is 97.9 F (36.6 C). His blood pressure is 123/44 (abnormal) and his pulse is 82. His respiration is 17 and oxygen saturation is 97%.   Wt Readings from Last 3 Encounters:  05/15/21 206 lb (93.4 kg)  05/04/21 209 lb (94.8 kg)  05/01/21 209 lb 12.8 oz (95.2 kg)    Physical Exam Vitals reviewed.  HENT:     Head: Normocephalic and atraumatic.  Eyes:     Pupils: Pupils are equal, round, and reactive to light.  Cardiovascular:     Rate and Rhythm: Normal rate and regular rhythm.     Heart sounds: Normal heart sounds.  Pulmonary:     Effort: Pulmonary effort is normal.     Breath sounds: Normal breath sounds.  Abdominal:     General: Bowel sounds are normal.     Palpations: Abdomen is soft.  Musculoskeletal:        General: No tenderness or deformity. Normal range of motion.     Cervical back: Normal range of motion.  Lymphadenopathy:     Cervical: No cervical adenopathy.  Skin:    General: Skin is warm and dry.     Findings: No erythema or rash.  Neurological:     Mental Status: He is alert and oriented to person, place, and time.  Psychiatric:        Behavior: Behavior normal.        Thought Content: Thought  content normal.        Judgment: Judgment normal.     Lab Results  Component Value Date   WBC 3.8 (L) 05/15/2021   HGB 12.5 (L) 05/15/2021   HCT 38.3 (L) 05/15/2021   MCV 96.5 05/15/2021   PLT 91 (L) 05/15/2021     Chemistry      Component  Value Date/Time   NA 138 05/15/2021 0800   NA 142 03/30/2015 1149   K 3.9 05/15/2021 0800   K 4.5 03/30/2015 1149   CL 104 05/15/2021 0800   CO2 27 05/15/2021 0800   CO2 25 03/30/2015 1149   BUN 17 05/15/2021 0800   BUN 17.1 03/30/2015 1149   CREATININE 1.02 05/15/2021 0800   CREATININE 1.2 03/30/2015 1149      Component Value Date/Time   CALCIUM 8.6 (L) 05/15/2021 0800   CALCIUM 8.7 03/30/2015 1149   ALKPHOS 70 05/15/2021 0800   AST 10 (L) 05/15/2021 0800   ALT 8 05/15/2021 0800   BILITOT 0.7 05/15/2021 0800       Impression and Plan: Mr. Rosemond is a very nice 85 year old white male who has acute myeloid leukemia.  He does have a adverse prognostic feature with respect to the FLT3 mutation.  I know there are targeted therapies for patients who have leukemia with this FLT3 mutation.  Again, Dr. Linus Orn is doing a fantastic job with him.  He is responding well to the Vidaza and the venetoclax.  There is no indication that we have to put him on one of the new targeted therapy.  We will go ahead and get him back in 2 weeks for his eighth cycle of treatment.  I think he sees Dr. Linus Orn next week.  He says the Toradol does seem to help him.  I also did call in some Toradol for him.  I told him to take 2 a day if necessary.  I am happy that the angiogram and Doppler show that the clots are resolving.  As such, we will decrease the Eliquis dose to a maintenance of 2.5 mg p.o. twice daily.   Volanda Napoleon, MD 7/11/20228:53 AM

## 2021-05-15 NOTE — Patient Instructions (Signed)
Implanted Port Home Guide An implanted port is a device that is placed under the skin. It is usually placed in the chest. The device can be used to give IV medicine, to take blood, or for dialysis. You may have an implanted port if: You need IV medicine that would be irritating to the small veins in your hands or arms. You need IV medicines, such as antibiotics, for a long period of time. You need IV nutrition for a long period of time. You need dialysis. When you have a port, your health care provider can choose to use the port instead of veins in your arms for these procedures. You may have fewer limitations when using a port than you would if you used other types of long-term IVs, and you will likely be able to return to normal activities afteryour incision heals. An implanted port has two main parts: Reservoir. The reservoir is the part where a needle is inserted to give medicines or draw blood. The reservoir is round. After it is placed, it appears as a small, raised area under your skin. Catheter. The catheter is a thin, flexible tube that connects the reservoir to a vein. Medicine that is inserted into the reservoir goes into the catheter and then into the vein. How is my port accessed? To access your port: A numbing cream may be placed on the skin over the port site. Your health care provider will put on a mask and sterile gloves. The skin over your port will be cleaned carefully with a germ-killing soap and allowed to dry. Your health care provider will gently pinch the port and insert a needle into it. Your health care provider will check for a blood return to make sure the port is in the vein and is not clogged. If your port needs to remain accessed to get medicine continuously (constant infusion), your health care provider will place a clear bandage (dressing) over the needle site. The dressing and needle will need to be changed every week, or as told by your health care provider. What  is flushing? Flushing helps keep the port from getting clogged. Follow instructions from your health care provider about how and when to flush the port. Ports are usually flushed with saline solution or a medicine called heparin. The need for flushing will depend on how the port is used: If the port is only used from time to time to give medicines or draw blood, the port may need to be flushed: Before and after medicines have been given. Before and after blood has been drawn. As part of routine maintenance. Flushing may be recommended every 4-6 weeks. If a constant infusion is running, the port may not need to be flushed. Throw away any syringes in a disposal container that is meant for sharp items (sharps container). You can buy a sharps container from a pharmacy, or you can make one by using an empty hard plastic bottle with a cover. How long will my port stay implanted? The port can stay in for as long as your health care provider thinks it is needed. When it is time for the port to come out, a surgery will be done to remove it. The surgery will be similar to the procedure that was done to putthe port in. Follow these instructions at home:  Flush your port as told by your health care provider. If you need an infusion over several days, follow instructions from your health care provider about how to take   care of your port site. Make sure you: Wash your hands with soap and water before you change your dressing. If soap and water are not available, use alcohol-based hand sanitizer. Change your dressing as told by your health care provider. Place any used dressings or infusion bags into a plastic bag. Throw that bag in the trash. Keep the dressing that covers the needle clean and dry. Do not get it wet. Do not use scissors or sharp objects near the tube. Keep the tube clamped, unless it is being used. Check your port site every day for signs of infection. Check for: Redness, swelling, or  pain. Fluid or blood. Pus or a bad smell. Protect the skin around the port site. Avoid wearing bra straps that rub or irritate the site. Protect the skin around your port from seat belts. Place a soft pad over your chest if needed. Bathe or shower as told by your health care provider. The site may get wet as long as you are not actively receiving an infusion. Return to your normal activities as told by your health care provider. Ask your health care provider what activities are safe for you. Carry a medical alert card or wear a medical alert bracelet at all times. This will let health care providers know that you have an implanted port in case of an emergency. Get help right away if: You have redness, swelling, or pain at the port site. You have fluid or blood coming from your port site. You have pus or a bad smell coming from the port site. You have a fever. Summary Implanted ports are usually placed in the chest for long-term IV access. Follow instructions from your health care provider about flushing the port and changing bandages (dressings). Take care of the area around your port by avoiding clothing that puts pressure on the area, and by watching for signs of infection. Protect the skin around your port from seat belts. Place a soft pad over your chest if needed. Get help right away if you have a fever or you have redness, swelling, pain, drainage, or a bad smell at the port site. This information is not intended to replace advice given to you by your health care provider. Make sure you discuss any questions you have with your healthcare provider. Document Revised: 03/07/2020 Document Reviewed: 03/07/2020 Elsevier Patient Education  2022 Elsevier Inc.  

## 2021-05-15 NOTE — Addendum Note (Signed)
Addended by: Tyler Aas A on: 05/15/2021 09:21 AM   Modules accepted: Orders

## 2021-05-22 ENCOUNTER — Inpatient Hospital Stay: Payer: Medicare Other

## 2021-05-22 ENCOUNTER — Other Ambulatory Visit: Payer: Medicare Other

## 2021-05-22 ENCOUNTER — Ambulatory Visit (INDEPENDENT_AMBULATORY_CARE_PROVIDER_SITE_OTHER): Payer: Medicare Other | Admitting: Family Medicine

## 2021-05-22 ENCOUNTER — Ambulatory Visit: Payer: Medicare Other

## 2021-05-22 ENCOUNTER — Other Ambulatory Visit: Payer: Self-pay

## 2021-05-22 ENCOUNTER — Encounter: Payer: Self-pay | Admitting: Family Medicine

## 2021-05-22 ENCOUNTER — Other Ambulatory Visit: Payer: Self-pay | Admitting: Family

## 2021-05-22 DIAGNOSIS — Z95828 Presence of other vascular implants and grafts: Secondary | ICD-10-CM

## 2021-05-22 DIAGNOSIS — M47816 Spondylosis without myelopathy or radiculopathy, lumbar region: Secondary | ICD-10-CM

## 2021-05-22 DIAGNOSIS — M25579 Pain in unspecified ankle and joints of unspecified foot: Secondary | ICD-10-CM

## 2021-05-22 DIAGNOSIS — Z5111 Encounter for antineoplastic chemotherapy: Secondary | ICD-10-CM | POA: Diagnosis not present

## 2021-05-22 DIAGNOSIS — C9500 Acute leukemia of unspecified cell type not having achieved remission: Secondary | ICD-10-CM

## 2021-05-22 LAB — CBC WITH DIFFERENTIAL (CANCER CENTER ONLY)
Abs Immature Granulocytes: 0.04 10*3/uL (ref 0.00–0.07)
Basophils Absolute: 0 10*3/uL (ref 0.0–0.1)
Basophils Relative: 1 %
Eosinophils Absolute: 0 10*3/uL (ref 0.0–0.5)
Eosinophils Relative: 0 %
HCT: 34.3 % — ABNORMAL LOW (ref 39.0–52.0)
Hemoglobin: 11.1 g/dL — ABNORMAL LOW (ref 13.0–17.0)
Immature Granulocytes: 1 %
Lymphocytes Relative: 18 %
Lymphs Abs: 0.9 10*3/uL (ref 0.7–4.0)
MCH: 30.8 pg (ref 26.0–34.0)
MCHC: 32.4 g/dL (ref 30.0–36.0)
MCV: 95.3 fL (ref 80.0–100.0)
Monocytes Absolute: 0.7 10*3/uL (ref 0.1–1.0)
Monocytes Relative: 14 %
Neutro Abs: 3.5 10*3/uL (ref 1.7–7.7)
Neutrophils Relative %: 66 %
Platelet Count: 71 10*3/uL — ABNORMAL LOW (ref 150–400)
RBC: 3.6 MIL/uL — ABNORMAL LOW (ref 4.22–5.81)
RDW: 15.9 % — ABNORMAL HIGH (ref 11.5–15.5)
WBC Count: 5.3 10*3/uL (ref 4.0–10.5)
nRBC: 0 % (ref 0.0–0.2)

## 2021-05-22 LAB — CMP (CANCER CENTER ONLY)
ALT: 6 U/L (ref 0–44)
AST: 9 U/L — ABNORMAL LOW (ref 15–41)
Albumin: 3.7 g/dL (ref 3.5–5.0)
Alkaline Phosphatase: 63 U/L (ref 38–126)
Anion gap: 8 (ref 5–15)
BUN: 18 mg/dL (ref 8–23)
CO2: 27 mmol/L (ref 22–32)
Calcium: 8.8 mg/dL — ABNORMAL LOW (ref 8.9–10.3)
Chloride: 106 mmol/L (ref 98–111)
Creatinine: 1 mg/dL (ref 0.61–1.24)
GFR, Estimated: >60 mL/min (ref >60)
Glucose, Bld: 119 mg/dL — ABNORMAL HIGH (ref 70–99)
Potassium: 3.8 mmol/L (ref 3.5–5.1)
Sodium: 141 mmol/L (ref 135–145)
Total Bilirubin: 0.5 mg/dL (ref 0.3–1.2)
Total Protein: 5.6 g/dL — ABNORMAL LOW (ref 6.5–8.1)

## 2021-05-22 MED ORDER — KETOROLAC TROMETHAMINE 15 MG/ML IJ SOLN
INTRAMUSCULAR | Status: AC
  Filled 2021-05-22: qty 2

## 2021-05-22 MED ORDER — SODIUM CHLORIDE 0.9% FLUSH
10.0000 mL | Freq: Once | INTRAVENOUS | Status: AC
  Administered 2021-05-22: 10 mL via INTRAVENOUS
  Filled 2021-05-22: qty 10

## 2021-05-22 MED ORDER — HEPARIN SOD (PORK) LOCK FLUSH 100 UNIT/ML IV SOLN
500.0000 [IU] | Freq: Once | INTRAVENOUS | Status: AC
  Administered 2021-05-22: 500 [IU] via INTRAVENOUS
  Filled 2021-05-22: qty 5

## 2021-05-22 MED ORDER — KETOROLAC TROMETHAMINE 15 MG/ML IJ SOLN
30.0000 mg | Freq: Once | INTRAMUSCULAR | Status: AC
  Administered 2021-05-22: 30 mg via INTRAVENOUS
  Filled 2021-05-22: qty 2

## 2021-05-22 NOTE — Patient Instructions (Signed)
Ketorolac Injection What is this medication? KETOROLAC (kee toe ROLE ak) is a non-steroidal anti-inflammatory drug, alsoknown as an NSAID. It treats pain, inflammation, and swelling. This medicine may be used for other purposes; ask your health care provider orpharmacist if you have questions. COMMON BRAND NAME(S): Toradol What should I tell my care team before I take this medication? They need to know if you have any of these conditions: bleeding disorder coronary artery bypass graft (CABG) within the past 2 weeks heart attack heart disease heart failure high blood pressure if you often drink alcohol kidney disease liver disease lung or breathing disease (asthma) receiving steroids like dexamethasone or prednisone smoke tobacco cigarettes stomach bleeding stomach ulcers, other stomach or intestine problems take medicine to treat or prevent blood clots an unusual or allergic reaction to ketorolac, other medicines, foods, dyes, or preservatives pregnant or trying to get pregnant breast-feeding How should I use this medication? This medicine is injected into a vein or muscle. It is given by a health careprovider in a hospital or clinic setting. A special MedGuide will be given to you before each treatment. Be sure to readthis information carefully each time. Talk to your health care provider about the use of this medicine in children.Special care may be needed. Patients over 63 years of age may have a stronger reaction and need a smallerdose. Overdosage: If you think you have taken too much of this medicine contact apoison control center or emergency room at once. NOTE: This medicine is only for you. Do not share this medicine with others. What if I miss a dose? This does not apply. This medicine is not for regular use. What may interact with this medication? Do not take this medicine with any of the following medications: aspirin and aspirin-like  medicines cidofovir methotrexate NSAIDs, medicines for pain and inflammation, like ibuprofen or naproxen pentoxifylline probenecid This medicine may also interact with the following medications: alcohol alendronate alprazolam carbamazepine diuretics flavocoxid fluoxetine ginkgo lithium medicines for blood pressure like enalapril medicines that affect platelets like pentoxifylline medicines that treat or prevent blood clots like heparin, warfarin muscle relaxants pemetrexed phenytoin thiothixene This list may not describe all possible interactions. Give your health care provider a list of all the medicines, herbs, non-prescription drugs, or dietary supplements you use. Also tell them if you smoke, drink alcohol, or use illegaldrugs. Some items may interact with your medicine. What should I watch for while using this medication? Your condition will be monitored carefully while you are receiving thismedicine. Do not use this medicine for more than 5 days. It is only used for short-term treatment of moderate to severe pain. The risk of side effects such as kidneydamage and stomach bleeding are higher if used for more than 5 days. Do not take other medicines that contain aspirin, ibuprofen, or naproxen with this medicine. Side effects such as stomach upset, nausea, or ulcers may be more likely to occur. Many non-prescription medicines contain aspirin,ibuprofen, or naproxen. Always read labels carefully. This medicine can cause serious ulcers and bleeding in the stomach. It can happen with no warning. Smoking, drinking alcohol, older age, and poor health can also increase risks. Call your health care provider right away if you havestomach pain or blood in your vomit or stool. Alcohol may interfere with the effect of this medicine. Avoid alcoholic drinks. This medicine may cause serious skin reactions. They can happen weeks to months after starting the medicine. Contact your health care  provider right away if you notice fevers  or flu-like symptoms with a rash. The rash may be red or purple and then turn into blisters or peeling of the skin. Or, you might notice a red rash with swelling of the face, lips or lymph nodes in your neck or underyour arms. Talk to your health care provider if you are pregnant before taking this medicine. Taking this medicine between weeks 20 and 30 of pregnancy may harm your unborn baby. Your health care provider will monitor you closely if youneed to take it. After 30 weeks of pregnancy, do not take this medicine. This medicine does not prevent a heart attack or stroke. This medicine may increase the chance of a heart attack or stroke. The chance may increase the longer you use this medicine or if you have heart disease. If you take aspirin to prevent a heart attack or stroke, talk to your health care provider aboutusing this medicine. You may get drowsy or dizzy. Do not drive, use machinery, or do anything that needs mental alertness until you know how this medicine affects you. Do not stand up or sit up quickly, especially if you are an older patient. Thisreduces the risk of dizzy or fainting spells. What side effects may I notice from receiving this medication? Side effects that you should report to your doctor or health care provider assoon as possible: allergic reactions (skin rash, itching or hives; swelling of the face, lips, or tongue) bleeding (bloody or black, tarry stools; red or dark brown urine; spitting up blood or brown material that looks like coffee grounds; red spots on the skin; unusual bruising or bleeding from the eyes, gums, or nose) heart attack (trouble breathing; pain or tightness in the chest, neck, back or arms; unusually weak or tired) heart failure (trouble breathing; fast, irregular heartbeat; sudden weight gain; swelling of the ankles, feet, hands; unusually weak or tired) kidney injury (trouble passing urine or change in the  amount of urine) liver injury (dark yellow or brown urine; general ill feeling or flu-like symptoms; loss of appetite, right upper belly pain; unusually weak or tired, yellowing of the eyes or skin) low red blood cell counts (trouble breathing; feeling faint; lightheaded, falls; unusually weak or tired) rash, fever, and swollen lymph nodes redness, blistering, peeling, or loosening of the skin, including inside the mouth stroke (changes in vision; confusion; trouble speaking or understanding; severe headaches; sudden numbness or weakness of the face, arm or leg; trouble walking; dizziness; loss of balance or coordination) Side effects that usually do not require medical attention (report to yourdoctor or health care professional if they continue or are bothersome): constipation decreased hearing, ringing in the ears diarrhea headache nausea pain, redness, or irritation at site where injected passing gas stomach pain upset stomach This list may not describe all possible side effects. Call your doctor for medical advice about side effects. You may report side effects to FDA at1-800-FDA-1088. Where should I keep my medication? This medicine is given in a hospital or clinic. It will not be stored at home. NOTE: This sheet is a summary. It may not cover all possible information. If you have questions about this medicine, talk to your doctor, pharmacist, orhealth care provider.  2022 Elsevier/Gold Standard (2020-03-17 15:02:26)

## 2021-05-22 NOTE — Progress Notes (Signed)
GAETANO ROMBERGER - 85 y.o. male MRN [id number]  Date of birth: Dec 14, 1935  SUBJECTIVE:  Including CC & ROS.  No chief complaint on file.   MARCHELLO ROTHGEB is a 85 y.o. male that is following up for his low back pain.  He has done well since having Toradol injections.  He had Limited improvement with the prednisone.  Denies any pain today.  Review of Systems See HPI   HISTORY: Past Medical, Surgical, Social, and Family History Reviewed & Updated per EMR.   Pertinent Historical Findings include:  Past Medical History:  Diagnosis Date   Arthritis    Atherosclerosis    Atherosclerotic PVD with intermittent claudication (HCC)    stent left leg   BPH (benign prostatic hyperplasia)    Bulging lumbar disc    Cancer ([place]) 11/12/14   vocal cord  carcinoma in situ , radiation; thyroid cancer   Carotid bruit    COPD (chronic obstructive pulmonary disease) (HCC)    DDD (degenerative disc disease), lumbar    Dysphonia    Dysplasia of true vocal cord    GERD (gastroesophageal reflux disease)    H/O carotid atherosclerosis    b/l   Hyperlipidemia    Hypothyroidism    Occasional tremors    left hand managed with propranolol   Peripheral vascular angioplasty status with implants and grafts    Precancerous lesion 03/05/2018   premelanoma removed from back.    Radiation 03/10/15- 04/18/16   Larynx   Sleep apnea    hasn't used CPAP in years   Spondylosis of lumbosacral region    Thyroid disease     Past Surgical History:  Procedure Laterality Date   APPENDECTOMY     COLONOSCOPY W/ POLYPECTOMY     DIODE LASER APPLICATION Left 62/37/6283   Procedure: DIODE LASER APPLICATION;  Surgeon: Hayden Pedro, MD;  Location: [facility];  Service: Ophthalmology;  Laterality: Left;   IR IMAGING GUIDED PORT INSERTION  01/16/2021   IR IVC FILTER PLMT / S&I /IMG GUID/MOD SED  03/17/2021   MEMBRANE PEEL Left 08/18/2019   Procedure: MEMBRANE PEEL;  Surgeon: Hayden Pedro, MD;  Location: [facility];  Service:  Ophthalmology;  Laterality: Left;   MOUTH SURGERY     tooth extraction   PARS PLANA VITRECTOMY Left 08/18/2019   PARS PLANA VITRECTOMY 27 GAUGE (Left)   PARS PLANA VITRECTOMY 27 GAUGE Left 08/18/2019   Procedure: PARS PLANA VITRECTOMY 27 GAUGE;  Surgeon: Hayden Pedro, MD;  Location: [facility];  Service: Ophthalmology;  Laterality: Left;   PHOTOCOAGULATION WITH LASER Left 08/18/2019   Procedure: PHOTOCOAGULATION WITH LASER;  Surgeon: Hayden Pedro, MD;  Location: [facility];  Service: Ophthalmology;  Laterality: Left;   THYROID SURGERY     TONSILLECTOMY     vocal cord biopsy  11/12/14   squamous cell carcinoma in situ    Family History  Problem Relation Age of Onset   Colon cancer Father    Cancer Sister        Died of metastatic cancer   Leukemia Neg Hx     Social History   Socioeconomic History   Marital status: Married    Spouse name: Not on file   Number of children: 2   Years of education: Not on file   Highest education level: Not on file  Occupational History   Not on file  Tobacco Use   Smoking status: Former    Packs/day: 2.00    Years: 51.00  Pack years: 102.00    Types: Cigarettes    Start date: 68    Quit date: 03/05/2004    Years since quitting: 17.2   Smokeless tobacco: Never  Vaping Use   Vaping Use: Never used  Substance and Sexual Activity   Alcohol use: Yes    Alcohol/week: 3.0 standard drinks    Types: 3 Glasses of wine per week    Comment: occ   Drug use: No   Sexual activity: Not on file  Other Topics Concern   Not on file  Social History Narrative   Not on file   Social Determinants of Health   Financial Resource Strain: Not on file  Food Insecurity: Not on file  Transportation Needs: Not on file  Physical Activity: Not on file  Stress: Not on file  Social Connections: Not on file  Intimate Partner Violence: Not on file     PHYSICAL EXAM:  VS: BP (!) 118/50 (BP Location: Right Arm, Patient Position: Sitting, Cuff Size: Large)    Ht 5\' 10"  (1.778 m)   Wt 206 lb (93.4 kg)   BMI 29.56 kg/m  Physical Exam Gen: NAD, alert, cooperative with exam, well-appearing     ASSESSMENT & PLAN:   Facet arthritis of lumbar region Significant improvement with the Toradol.  Reports no pain today. -Counseled on home exercise therapy and supportive care. -Could consider physical therapy or injection.

## 2021-05-22 NOTE — Patient Instructions (Signed)
Implanted Port Home Guide An implanted port is a device that is placed under the skin. It is usually placed in the chest. The device can be used to give IV medicine, to take blood, or for dialysis. You may have an implanted port if: You need IV medicine that would be irritating to the small veins in your hands or arms. You need IV medicines, such as antibiotics, for a long period of time. You need IV nutrition for a long period of time. You need dialysis. When you have a port, your health care provider can choose to use the port instead of veins in your arms for these procedures. You may have fewer limitations when using a port than you would if you used other types of long-term IVs, and you will likely be able to return to normal activities afteryour incision heals. An implanted port has two main parts: Reservoir. The reservoir is the part where a needle is inserted to give medicines or draw blood. The reservoir is round. After it is placed, it appears as a small, raised area under your skin. Catheter. The catheter is a thin, flexible tube that connects the reservoir to a vein. Medicine that is inserted into the reservoir goes into the catheter and then into the vein. How is my port accessed? To access your port: A numbing cream may be placed on the skin over the port site. Your health care provider will put on a mask and sterile gloves. The skin over your port will be cleaned carefully with a germ-killing soap and allowed to dry. Your health care provider will gently pinch the port and insert a needle into it. Your health care provider will check for a blood return to make sure the port is in the vein and is not clogged. If your port needs to remain accessed to get medicine continuously (constant infusion), your health care provider will place a clear bandage (dressing) over the needle site. The dressing and needle will need to be changed every week, or as told by your health care provider. What  is flushing? Flushing helps keep the port from getting clogged. Follow instructions from your health care provider about how and when to flush the port. Ports are usually flushed with saline solution or a medicine called heparin. The need for flushing will depend on how the port is used: If the port is only used from time to time to give medicines or draw blood, the port may need to be flushed: Before and after medicines have been given. Before and after blood has been drawn. As part of routine maintenance. Flushing may be recommended every 4-6 weeks. If a constant infusion is running, the port may not need to be flushed. Throw away any syringes in a disposal container that is meant for sharp items (sharps container). You can buy a sharps container from a pharmacy, or you can make one by using an empty hard plastic bottle with a cover. How long will my port stay implanted? The port can stay in for as long as your health care provider thinks it is needed. When it is time for the port to come out, a surgery will be done to remove it. The surgery will be similar to the procedure that was done to putthe port in. Follow these instructions at home:  Flush your port as told by your health care provider. If you need an infusion over several days, follow instructions from your health care provider about how to take   care of your port site. Make sure you: Wash your hands with soap and water before you change your dressing. If soap and water are not available, use alcohol-based hand sanitizer. Change your dressing as told by your health care provider. Place any used dressings or infusion bags into a plastic bag. Throw that bag in the trash. Keep the dressing that covers the needle clean and dry. Do not get it wet. Do not use scissors or sharp objects near the tube. Keep the tube clamped, unless it is being used. Check your port site every day for signs of infection. Check for: Redness, swelling, or  pain. Fluid or blood. Pus or a bad smell. Protect the skin around the port site. Avoid wearing bra straps that rub or irritate the site. Protect the skin around your port from seat belts. Place a soft pad over your chest if needed. Bathe or shower as told by your health care provider. The site may get wet as long as you are not actively receiving an infusion. Return to your normal activities as told by your health care provider. Ask your health care provider what activities are safe for you. Carry a medical alert card or wear a medical alert bracelet at all times. This will let health care providers know that you have an implanted port in case of an emergency. Get help right away if: You have redness, swelling, or pain at the port site. You have fluid or blood coming from your port site. You have pus or a bad smell coming from the port site. You have a fever. Summary Implanted ports are usually placed in the chest for long-term IV access. Follow instructions from your health care provider about flushing the port and changing bandages (dressings). Take care of the area around your port by avoiding clothing that puts pressure on the area, and by watching for signs of infection. Protect the skin around your port from seat belts. Place a soft pad over your chest if needed. Get help right away if you have a fever or you have redness, swelling, pain, drainage, or a bad smell at the port site. This information is not intended to replace advice given to you by your health care provider. Make sure you discuss any questions you have with your healthcare provider. Document Revised: 03/07/2020 Document Reviewed: 03/07/2020 Elsevier Patient Education  2022 Elsevier Inc.  

## 2021-05-22 NOTE — Assessment & Plan Note (Signed)
Significant improvement with the Toradol.  Reports no pain today. -Counseled on home exercise therapy and supportive care. -Could consider physical therapy or injection.

## 2021-05-22 NOTE — Addendum Note (Signed)
Addended by: Tyler Aas A on: 05/22/2021 09:39 AM   Modules accepted: Orders

## 2021-05-29 ENCOUNTER — Inpatient Hospital Stay (HOSPITAL_BASED_OUTPATIENT_CLINIC_OR_DEPARTMENT_OTHER): Payer: Medicare Other | Admitting: Hematology & Oncology

## 2021-05-29 ENCOUNTER — Inpatient Hospital Stay: Payer: Medicare Other

## 2021-05-29 ENCOUNTER — Other Ambulatory Visit: Payer: Self-pay

## 2021-05-29 ENCOUNTER — Encounter: Payer: Self-pay | Admitting: Hematology & Oncology

## 2021-05-29 VITALS — BP 132/46 | HR 84 | Temp 98.6°F | Resp 18 | Ht 70.0 in | Wt 211.0 lb

## 2021-05-29 DIAGNOSIS — C969 Malignant neoplasm of lymphoid, hematopoietic and related tissue, unspecified: Secondary | ICD-10-CM

## 2021-05-29 DIAGNOSIS — C9501 Acute leukemia of unspecified cell type, in remission: Secondary | ICD-10-CM | POA: Diagnosis not present

## 2021-05-29 DIAGNOSIS — Z452 Encounter for adjustment and management of vascular access device: Secondary | ICD-10-CM

## 2021-05-29 DIAGNOSIS — Z5111 Encounter for antineoplastic chemotherapy: Secondary | ICD-10-CM | POA: Diagnosis not present

## 2021-05-29 DIAGNOSIS — S2231XA Fracture of one rib, right side, initial encounter for closed fracture: Secondary | ICD-10-CM

## 2021-05-29 DIAGNOSIS — C9500 Acute leukemia of unspecified cell type not having achieved remission: Secondary | ICD-10-CM

## 2021-05-29 DIAGNOSIS — M5416 Radiculopathy, lumbar region: Secondary | ICD-10-CM

## 2021-05-29 DIAGNOSIS — M5412 Radiculopathy, cervical region: Secondary | ICD-10-CM

## 2021-05-29 LAB — LACTATE DEHYDROGENASE: LDH: 156 U/L (ref 98–192)

## 2021-05-29 LAB — CMP (CANCER CENTER ONLY)
ALT: 7 U/L (ref 0–44)
AST: 10 U/L — ABNORMAL LOW (ref 15–41)
Albumin: 3.8 g/dL (ref 3.5–5.0)
Alkaline Phosphatase: 81 U/L (ref 38–126)
Anion gap: 9 (ref 5–15)
BUN: 9 mg/dL (ref 8–23)
CO2: 27 mmol/L (ref 22–32)
Calcium: 8.6 mg/dL — ABNORMAL LOW (ref 8.9–10.3)
Chloride: 105 mmol/L (ref 98–111)
Creatinine: 1.05 mg/dL (ref 0.61–1.24)
GFR, Estimated: >60 mL/min (ref >60)
Glucose, Bld: 139 mg/dL — ABNORMAL HIGH (ref 70–99)
Potassium: 3.4 mmol/L — ABNORMAL LOW (ref 3.5–5.1)
Sodium: 141 mmol/L (ref 135–145)
Total Bilirubin: 0.5 mg/dL (ref 0.3–1.2)
Total Protein: 5.6 g/dL — ABNORMAL LOW (ref 6.5–8.1)

## 2021-05-29 LAB — CBC WITH DIFFERENTIAL (CANCER CENTER ONLY)
Abs Immature Granulocytes: 0.03 10*3/uL (ref 0.00–0.07)
Basophils Absolute: 0 10*3/uL (ref 0.0–0.1)
Basophils Relative: 0 %
Eosinophils Absolute: 0 10*3/uL (ref 0.0–0.5)
Eosinophils Relative: 0 %
HCT: 33.4 % — ABNORMAL LOW (ref 39.0–52.0)
Hemoglobin: 10.9 g/dL — ABNORMAL LOW (ref 13.0–17.0)
Immature Granulocytes: 1 %
Lymphocytes Relative: 26 %
Lymphs Abs: 1.2 10*3/uL (ref 0.7–4.0)
MCH: 30.7 pg (ref 26.0–34.0)
MCHC: 32.6 g/dL (ref 30.0–36.0)
MCV: 94.1 fL (ref 80.0–100.0)
Monocytes Absolute: 0.1 10*3/uL (ref 0.1–1.0)
Monocytes Relative: 3 %
Neutro Abs: 3.3 10*3/uL (ref 1.7–7.7)
Neutrophils Relative %: 70 %
Platelet Count: 89 10*3/uL — ABNORMAL LOW (ref 150–400)
RBC: 3.55 MIL/uL — ABNORMAL LOW (ref 4.22–5.81)
RDW: 15.9 % — ABNORMAL HIGH (ref 11.5–15.5)
WBC Count: 4.7 10*3/uL (ref 4.0–10.5)
nRBC: 0 % (ref 0.0–0.2)

## 2021-05-29 LAB — SAVE SMEAR(SSMR), FOR PROVIDER SLIDE REVIEW

## 2021-05-29 MED ORDER — SODIUM CHLORIDE 0.9 % IV SOLN
49.5000 mg/m2 | Freq: Once | INTRAVENOUS | Status: AC
  Administered 2021-05-29: 100 mg via INTRAVENOUS
  Filled 2021-05-29: qty 10

## 2021-05-29 MED ORDER — HEPARIN SOD (PORK) LOCK FLUSH 100 UNIT/ML IV SOLN
500.0000 [IU] | Freq: Once | INTRAVENOUS | Status: AC | PRN
  Administered 2021-05-29: 500 [IU]
  Filled 2021-05-29: qty 5

## 2021-05-29 MED ORDER — SODIUM CHLORIDE 0.9 % IV SOLN
Freq: Once | INTRAVENOUS | Status: AC
  Filled 2021-05-29: qty 250

## 2021-05-29 MED ORDER — KETOROLAC TROMETHAMINE 15 MG/ML IJ SOLN
30.0000 mg | Freq: Once | INTRAMUSCULAR | Status: AC
  Administered 2021-05-29: 30 mg via INTRAVENOUS

## 2021-05-29 MED ORDER — SODIUM CHLORIDE 0.9% FLUSH
10.0000 mL | INTRAVENOUS | Status: DC | PRN
  Administered 2021-05-29: 10 mL
  Filled 2021-05-29: qty 10

## 2021-05-29 MED ORDER — PALONOSETRON HCL INJECTION 0.25 MG/5ML
0.2500 mg | Freq: Once | INTRAVENOUS | Status: AC
  Administered 2021-05-29: 0.25 mg via INTRAVENOUS

## 2021-05-29 MED ORDER — PALONOSETRON HCL INJECTION 0.25 MG/5ML
INTRAVENOUS | Status: AC
  Filled 2021-05-29: qty 5

## 2021-05-29 MED ORDER — SODIUM CHLORIDE 0.9 % IV SOLN
10.0000 mg | Freq: Once | INTRAVENOUS | Status: AC
  Administered 2021-05-29: 10 mg via INTRAVENOUS
  Filled 2021-05-29: qty 10

## 2021-05-29 MED ORDER — KETOROLAC TROMETHAMINE 15 MG/ML IJ SOLN
INTRAMUSCULAR | Status: AC
  Filled 2021-05-29: qty 2

## 2021-05-29 NOTE — Progress Notes (Signed)
Reviewed pt labs with Dr. [name] and pt ok to treat with platelets 89.

## 2021-05-29 NOTE — Patient Instructions (Signed)

## 2021-05-29 NOTE — Progress Notes (Signed)
Hematology and Oncology Follow Up Visit  SAM OVERBECK [id number] 07-25-1936 85 y.o. 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 twice daily --started on 03/16/2021     Interim History:  Mr. Dungan is back for follow-up.  He is doing quite nicely.  He really has had a very nice response to the Vidaza/venetoclax combination.  He is followed by Dr. Linus Orn at a [facility].  He saw Dr. Linus Orn a week ago.  Dr. Linus Orn was happy with the protocol that he is taking.  He still has a back issues.  This is lower back.  This is not related to many of his treatments.  He is on the Eliquis to 2.5 mg p.o. twice daily.  He does have the IVC filter in.  We can certainly consider getting the filter removed.  His platelet counts are he has been better.  I would still like to keep the Eliquis at the 2.5 mg p.o. twice daily dose.  He will need this as a maintenance.  He has had no problems with fever.  He is no cough or shortness of breath.  He has had no rashes.  He does have ecchymoses.  There is been no headache.  Overall, I would have to say his performance status is ECOG 1.   Medications:  Current Outpatient Medications:    acetaminophen (TYLENOL) 500 MG tablet, Take 1,000 mg by mouth every 6 (six) hours as needed for moderate pain., Disp: , Rfl:    acyclovir (ZOVIRAX) 400 MG tablet, Take 1 tablet (400 mg total) by mouth 2 (two) times daily as needed., Disp: 60 tablet, Rfl: 2   apixaban (ELIQUIS) 2.5 MG TABS tablet, Take 1 tablet (2.5 mg total) by mouth 2 (two) times daily., Disp: 60 tablet, Rfl: 6   doxazosin (CARDURA) 2 MG tablet, Take 1 tablet (2 mg total) by mouth at bedtime., Disp: 90 tablet, Rfl: 3   fluconazole (DIFLUCAN) 200 MG tablet, Take 200 mg by mouth daily. (Patient not taking: No sig reported), Disp: , Rfl:     fluticasone (FLONASE) 50 MCG/ACT nasal spray, Place 2 sprays into the nose 2 (two) times daily as needed for allergies., Disp: , Rfl:    hydrALAZINE (APRESOLINE) 25 MG tablet, Take 1 tablet (25 mg total) by mouth 3 (three) times daily as needed. Take if standing BP >140/80 mm Hg, Disp: 90 tablet, Rfl: 2   HYDROcodone-acetaminophen (NORCO/VICODIN) 5-325 MG tablet, Take 1 tablet by mouth every 6 (six) hours as needed for moderate pain., Disp: , Rfl:    levofloxacin (LEVAQUIN) 500 MG tablet, Take 500 mg by mouth daily. (Patient not taking: No sig reported), Disp: , Rfl:    levothyroxine (SYNTHROID) 175 MCG tablet, Take 1 tablet (175 mcg total) by mouth daily., Disp: 90 tablet, Rfl: 1   Lidocaine (SALONPAS PAIN RELIEVING) 4 % PTCH, Apply topically. Uses 1/2 patch daily., Disp: , Rfl:    lidocaine-prilocaine (EMLA) cream, Apply 1 application topically as needed for up to 30 doses., Disp: 30 g, Rfl: 0   Loratadine (CLARITIN PO), Take 10 mg by mouth at bedtime. Allertec, Disp: , Rfl:    Melatonin 5 MG CHEW, Chew 1 tablet by mouth daily as needed (sleep)., Disp: , Rfl:    omeprazole (PRILOSEC) 20 MG capsule, TAKE 1 CAPSULE BY MOUTH TWICE A DAY BEFORE A MEAL, Disp: 180 capsule,  Rfl: 0   potassium chloride (MICRO-K) 10 MEQ CR capsule, Take 10 mEq by mouth in the morning and at bedtime., Disp: , Rfl:    propranolol ER (INDERAL LA) 80 MG 24 hr capsule, TAKE ONE CAPSULE BY MOUTH ONE TIME DAILY, Disp: 90 capsule, Rfl: 0   simvastatin (ZOCOR) 40 MG tablet, TAKE ONE TABLET BY MOUTH DAILY AT BEDTIME (Patient taking differently: Take 40 mg by mouth daily at 6 PM.), Disp: 90 tablet, Rfl: 3   traMADol (ULTRAM) 50 MG tablet, Take 1 tablet (50 mg total) by mouth every 6 (six) hours as needed., Disp: 120 tablet, Rfl: 1   venetoclax 100 MG TABS, Take 100 mg by mouth in the morning, at noon, in the evening, and at bedtime. (Patient not taking: No sig reported), Disp: , Rfl:   Allergies: No Known Allergies  Past Medical  History, Surgical history, Social history, and Family History were reviewed and updated.  Review of Systems: Review of Systems  Constitutional: Negative.   HENT:  Negative.    Eyes: Negative.   Respiratory: Negative.    Cardiovascular: Negative.   Gastrointestinal: Negative.   Endocrine: Negative.   Genitourinary: Negative.    Musculoskeletal: Negative.   Skin: Negative.   Neurological: Negative.   Hematological: Negative.   Psychiatric/Behavioral: Negative.     Physical Exam:  height is $RemoveB'5\' 10"'hmvxdKVc$  (1.778 m) and weight is 211 lb (95.7 kg). His oral temperature is 98.6 F (37 C). His blood pressure is 132/46 (abnormal) and his pulse is 84. His respiration is 18 and oxygen saturation is 96%.   Wt Readings from Last 3 Encounters:  05/29/21 211 lb (95.7 kg)  05/29/21 211 lb (95.7 kg)  05/22/21 206 lb (93.4 kg)    Physical Exam Vitals reviewed.  HENT:     Head: Normocephalic and atraumatic.  Eyes:     Pupils: Pupils are equal, round, and reactive to light.  Cardiovascular:     Rate and Rhythm: Normal rate and regular rhythm.     Heart sounds: Normal heart sounds.  Pulmonary:     Effort: Pulmonary effort is normal.     Breath sounds: Normal breath sounds.  Abdominal:     General: Bowel sounds are normal.     Palpations: Abdomen is soft.  Musculoskeletal:        General: No tenderness or deformity. Normal range of motion.     Cervical back: Normal range of motion.  Lymphadenopathy:     Cervical: No cervical adenopathy.  Skin:    General: Skin is warm and dry.     Findings: No erythema or rash.  Neurological:     Mental Status: He is alert and oriented to person, place, and time.  Psychiatric:        Behavior: Behavior normal.        Thought Content: Thought content normal.        Judgment: Judgment normal.     Lab Results  Component Value Date   WBC 4.7 05/29/2021   HGB 10.9 (L) 05/29/2021   HCT 33.4 (L) 05/29/2021   MCV 94.1 05/29/2021   PLT 89 (L) 05/29/2021      Chemistry      Component Value Date/Time   NA 141 05/29/2021 0906   NA 142 03/30/2015 1149   K 3.4 (L) 05/29/2021 0906   K 4.5 03/30/2015 1149   CL 105 05/29/2021 0906   CO2 27 05/29/2021 0906   CO2 25 03/30/2015 1149   BUN 9  05/29/2021 0906   BUN 17.1 03/30/2015 1149   CREATININE 1.05 05/29/2021 0906   CREATININE 1.2 03/30/2015 1149      Component Value Date/Time   CALCIUM 8.6 (L) 05/29/2021 0906   CALCIUM 8.7 03/30/2015 1149   ALKPHOS 81 05/29/2021 0906   AST 10 (L) 05/29/2021 0906   ALT 7 05/29/2021 0906   BILITOT 0.5 05/29/2021 0906       Impression and Plan: Mr. Maffeo is a very nice 85 year old white male who has acute myeloid leukemia.  He does have a adverse prognostic feature with respect to the FLT3 mutation.  I know there are targeted therapies for patients who have leukemia with this FLT3 mutation.  Again, Dr. Linus Orn is doing a fantastic job with him.  He is responding well to the Vidaza and the venetoclax.  There is no indication that we have to put him on one of the new targeted therapy.  He will start his seventh cycle of treatment.  He only gets the venetoclax only 14 days.  He gets a Vidaza for 5 days.  I think we can probably do his lab testing every 2 weeks now.  I think we can begin to decrease the frequency of his visits.  I know this would make him feel better.  I am just very impressed with how well he is responded.  We will plan to get him back in another month.   Volanda Napoleon, MD 7/25/202210:05 AM

## 2021-05-29 NOTE — Patient Instructions (Signed)
[facility]  Discharge Instructions: °Thank you for choosing [facility] to provide your oncology and hematology care.  ° °If you have a lab appointment with the Cancer Center, please go directly to the Cancer Center and check in at the registration area. ° °Wear comfortable clothing and clothing appropriate for easy access to any Portacath or PICC line.  ° °We strive to give you quality time with your provider. You may need to reschedule your appointment if you arrive late (15 or more minutes).  Arriving late affects you and other patients whose appointments are after yours.  Also, if you miss three or more appointments without notifying the office, you may be dismissed from the clinic at the provider’s discretion.    °  °For prescription refill requests, have your pharmacy contact our office and allow 72 hours for refills to be completed.   ° °Today you received the following chemotherapy and/or immunotherapy agents Vidaza °  °To help prevent nausea and vomiting after your treatment, we encourage you to take your nausea medication as directed. ° °BELOW ARE SYMPTOMS THAT SHOULD BE REPORTED IMMEDIATELY: °*FEVER GREATER THAN 100.4 F (38 °C) OR HIGHER °*CHILLS OR SWEATING °*NAUSEA AND VOMITING THAT IS NOT CONTROLLED WITH YOUR NAUSEA MEDICATION °*UNUSUAL SHORTNESS OF BREATH °*UNUSUAL BRUISING OR BLEEDING °*URINARY PROBLEMS (pain or burning when urinating, or frequent urination) °*BOWEL PROBLEMS (unusual diarrhea, constipation, pain near the anus) °TENDERNESS IN MOUTH AND THROAT WITH OR WITHOUT PRESENCE OF ULCERS (sore throat, sores in mouth, or a toothache) °UNUSUAL RASH, SWELLING OR PAIN  °UNUSUAL VAGINAL DISCHARGE OR ITCHING  ° °Items with * indicate a potential emergency and should be followed up as soon as possible or go to the Emergency Department if any problems should occur. ° °Please show the CHEMOTHERAPY ALERT CARD or IMMUNOTHERAPY ALERT CARD at check-in to the  Emergency Department and triage nurse. °Should you have questions after your visit or need to cancel or reschedule your appointment, please contact [facility]  [phone] and follow the prompts.  Office hours are 8:00 a.m. to 4:30 p.m. Monday - Friday. Please note that voicemails left after 4:00 p.m. may not be returned until the following business day.  We are closed weekends and major holidays. You have access to a nurse at all times for urgent questions. Please call the main number to the clinic [phone] and follow the prompts. ° °For any non-urgent questions, you may also contact your provider using MyChart. We now offer e-Visits for anyone 18 and older to request care online for non-urgent symptoms. For details visit [email or web address]. °  °Also download the MyChart app! Go to the app store, search "MyChart", open the app, select [facility], and log in with your MyChart username and password. ° °Due to Covid, a mask is required upon entering the hospital/clinic. If you do not have a mask, one will be given to you upon arrival. For doctor visits, patients may have 1 support person aged 18 or older with them. For treatment visits, patients cannot have anyone with them due to current Covid guidelines and our immunocompromised population.  °

## 2021-05-30 ENCOUNTER — Inpatient Hospital Stay: Payer: Medicare Other

## 2021-05-30 ENCOUNTER — Telehealth: Payer: Self-pay

## 2021-05-30 VITALS — BP 129/81 | HR 91 | Temp 98.0°F | Resp 18

## 2021-05-30 DIAGNOSIS — C9500 Acute leukemia of unspecified cell type not having achieved remission: Secondary | ICD-10-CM

## 2021-05-30 DIAGNOSIS — Z5111 Encounter for antineoplastic chemotherapy: Secondary | ICD-10-CM | POA: Diagnosis not present

## 2021-05-30 MED ORDER — SODIUM CHLORIDE 0.9 % IV SOLN
Freq: Once | INTRAVENOUS | Status: AC
  Filled 2021-05-30: qty 250

## 2021-05-30 MED ORDER — SODIUM CHLORIDE 0.9 % IV SOLN
49.5000 mg/m2 | Freq: Once | INTRAVENOUS | Status: AC
  Administered 2021-05-30: 100 mg via INTRAVENOUS
  Filled 2021-05-30: qty 10

## 2021-05-30 MED ORDER — HEPARIN SOD (PORK) LOCK FLUSH 100 UNIT/ML IV SOLN
500.0000 [IU] | Freq: Once | INTRAVENOUS | Status: AC | PRN
  Administered 2021-05-30: 500 [IU]
  Filled 2021-05-30: qty 5

## 2021-05-30 MED ORDER — SODIUM CHLORIDE 0.9% FLUSH
10.0000 mL | INTRAVENOUS | Status: DC | PRN
  Administered 2021-05-30: 10 mL
  Filled 2021-05-30: qty 10

## 2021-05-30 MED ORDER — SODIUM CHLORIDE 0.9 % IV SOLN
10.0000 mg | Freq: Once | INTRAVENOUS | Status: AC
  Administered 2021-05-30: 10 mg via INTRAVENOUS
  Filled 2021-05-30: qty 10

## 2021-05-30 NOTE — Patient Instructions (Signed)
[facility]  Discharge Instructions: °Thank you for choosing [facility] to provide your oncology and hematology care.  ° °If you have a lab appointment with the Cancer Center, please go directly to the Cancer Center and check in at the registration area. ° °Wear comfortable clothing and clothing appropriate for easy access to any Portacath or PICC line.  ° °We strive to give you quality time with your provider. You may need to reschedule your appointment if you arrive late (15 or more minutes).  Arriving late affects you and other patients whose appointments are after yours.  Also, if you miss three or more appointments without notifying the office, you may be dismissed from the clinic at the provider’s discretion.    °  °For prescription refill requests, have your pharmacy contact our office and allow 72 hours for refills to be completed.   ° °Today you received the following chemotherapy and/or immunotherapy agents Vidaza °  °To help prevent nausea and vomiting after your treatment, we encourage you to take your nausea medication as directed. ° °BELOW ARE SYMPTOMS THAT SHOULD BE REPORTED IMMEDIATELY: °*FEVER GREATER THAN 100.4 F (38 °C) OR HIGHER °*CHILLS OR SWEATING °*NAUSEA AND VOMITING THAT IS NOT CONTROLLED WITH YOUR NAUSEA MEDICATION °*UNUSUAL SHORTNESS OF BREATH °*UNUSUAL BRUISING OR BLEEDING °*URINARY PROBLEMS (pain or burning when urinating, or frequent urination) °*BOWEL PROBLEMS (unusual diarrhea, constipation, pain near the anus) °TENDERNESS IN MOUTH AND THROAT WITH OR WITHOUT PRESENCE OF ULCERS (sore throat, sores in mouth, or a toothache) °UNUSUAL RASH, SWELLING OR PAIN  °UNUSUAL VAGINAL DISCHARGE OR ITCHING  ° °Items with * indicate a potential emergency and should be followed up as soon as possible or go to the Emergency Department if any problems should occur. ° °Please show the CHEMOTHERAPY ALERT CARD or IMMUNOTHERAPY ALERT CARD at check-in to the  Emergency Department and triage nurse. °Should you have questions after your visit or need to cancel or reschedule your appointment, please contact [facility]  [phone] and follow the prompts.  Office hours are 8:00 a.m. to 4:30 p.m. Monday - Friday. Please note that voicemails left after 4:00 p.m. may not be returned until the following business day.  We are closed weekends and major holidays. You have access to a nurse at all times for urgent questions. Please call the main number to the clinic [phone] and follow the prompts. ° °For any non-urgent questions, you may also contact your provider using MyChart. We now offer e-Visits for anyone 18 and older to request care online for non-urgent symptoms. For details visit [email or web address]. °  °Also download the MyChart app! Go to the app store, search "MyChart", open the app, select [facility], and log in with your MyChart username and password. ° °Due to Covid, a mask is required upon entering the hospital/clinic. If you do not have a mask, one will be given to you upon arrival. For doctor visits, patients may have 1 support person aged 18 or older with them. For treatment visits, patients cannot have anyone with them due to current Covid guidelines and our immunocompromised population.  °

## 2021-05-30 NOTE — Telephone Encounter (Signed)
Appts made and pritned for pt per 05/29/21 los   Victor [name]

## 2021-05-31 ENCOUNTER — Inpatient Hospital Stay: Payer: Medicare Other

## 2021-05-31 ENCOUNTER — Other Ambulatory Visit: Payer: Self-pay

## 2021-05-31 VITALS — BP 145/57 | HR 80 | Temp 98.1°F | Resp 17

## 2021-05-31 DIAGNOSIS — C9500 Acute leukemia of unspecified cell type not having achieved remission: Secondary | ICD-10-CM

## 2021-05-31 DIAGNOSIS — Z5111 Encounter for antineoplastic chemotherapy: Secondary | ICD-10-CM | POA: Diagnosis not present

## 2021-05-31 MED ORDER — SODIUM CHLORIDE 0.9 % IV SOLN
Freq: Once | INTRAVENOUS | Status: AC
  Filled 2021-05-31: qty 250

## 2021-05-31 MED ORDER — HEPARIN SOD (PORK) LOCK FLUSH 100 UNIT/ML IV SOLN
500.0000 [IU] | Freq: Once | INTRAVENOUS | Status: AC | PRN
  Administered 2021-05-31: 500 [IU]
  Filled 2021-05-31: qty 5

## 2021-05-31 MED ORDER — AZACITIDINE CHEMO INJECTION 100 MG
49.5000 mg/m2 | Freq: Once | INTRAMUSCULAR | Status: AC
Start: 2021-05-31 — End: 2021-05-31
  Administered 2021-05-31: 100 mg via INTRAVENOUS
  Filled 2021-05-31: qty 10

## 2021-05-31 MED ORDER — PALONOSETRON HCL INJECTION 0.25 MG/5ML
0.2500 mg | Freq: Once | INTRAVENOUS | Status: AC
Start: 2021-05-31 — End: 2021-05-31
  Administered 2021-05-31: 0.25 mg via INTRAVENOUS

## 2021-05-31 MED ORDER — SODIUM CHLORIDE 0.9% FLUSH
10.0000 mL | INTRAVENOUS | Status: DC | PRN
  Administered 2021-05-31: 10 mL
  Filled 2021-05-31: qty 10

## 2021-05-31 MED ORDER — SODIUM CHLORIDE 0.9 % IV SOLN
10.0000 mg | Freq: Once | INTRAVENOUS | Status: AC
  Administered 2021-05-31: 10 mg via INTRAVENOUS
  Filled 2021-05-31: qty 10

## 2021-05-31 MED ORDER — PALONOSETRON HCL INJECTION 0.25 MG/5ML
INTRAVENOUS | Status: AC
  Filled 2021-05-31: qty 5

## 2021-05-31 NOTE — Progress Notes (Signed)
Prior to leaving pt states he had dizziness x4-5 hrs after getting home from chemo. It subsided and pt did not have any distress at the time of even or after.  Will forward concern to MD per pt request

## 2021-05-31 NOTE — Patient Instructions (Signed)
Azacitidine suspension for injection (subcutaneous use) What is this medication? AZACITIDINE (ay za SITE i deen) is a chemotherapy drug. This medicine reduces the growth of cancer cells and can suppress the immune system. It is used fortreating myelodysplastic syndrome or some types of leukemia. This medicine may be used for other purposes; ask your health care provider orpharmacist if you have questions. COMMON BRAND NAME(S): Vidaza What should I tell my care team before I take this medication? They need to know if you have any of these conditions: kidney disease liver disease liver tumors an unusual or allergic reaction to azacitidine, mannitol, other medicines, foods, dyes, or preservatives pregnant or trying to get pregnant breast-feeding How should I use this medication? This medicine is for injection under the skin. It is administered in a hospitalor clinic by a specially trained health care professional. Talk to your pediatrician regarding the use of this medicine in children. Whilethis drug may be prescribed for selected conditions, precautions do apply. Overdosage: If you think you have taken too much of this medicine contact apoison control center or emergency room at once. NOTE: This medicine is only for you. Do not share this medicine with others. What if I miss a dose? It is important not to miss your dose. Call your doctor or health careprofessional if you are unable to keep an appointment. What may interact with this medication? Interactions have not been studied. Give your health care provider a list of all the medicines, herbs, non-prescription drugs, or dietary supplements you use. Also tell them if you smoke, drink alcohol, or use illegal drugs. Some items may interact with yourmedicine. This list may not describe all possible interactions. Give your health care provider a list of all the medicines, herbs, non-prescription drugs, or dietary supplements you use. Also tell  them if you smoke, drink alcohol, or use illegaldrugs. Some items may interact with your medicine. What should I watch for while using this medication? Visit your doctor for checks on your progress. This drug may make you feel generally unwell. This is not uncommon, as chemotherapy can affect healthy cells as well as cancer cells. Report any side effects. Continue your course oftreatment even though you feel ill unless your doctor tells you to stop. In some cases, you may be given additional medicines to help with side effects.Follow all directions for their use. Call your doctor or health care professional for advice if you get a fever, chills or sore throat, or other symptoms of a cold or flu. Do not treat yourself. This drug decreases your body's ability to fight infections. Try toavoid being around people who are sick. This medicine may increase your risk to bruise or bleed. Call your doctor orhealth care professional if you notice any unusual bleeding. You may need blood work done while you are taking this medicine. Do not become pregnant while taking this medicine and for 6 months after the last dose. Women should inform their doctor if they wish to become pregnant or think they might be pregnant. Men should not father a child while taking this medicine and for 3 months after the last dose. There is a potential for serious side effects to an unborn child. Talk to your health care professional or pharmacist for more information. Do not breast-feed an infant while taking thismedicine and for 1 week after the last dose. This medicine may interfere with the ability to have a child. Talk with yourdoctor or health care professional if you are concerned about your fertility.   What side effects may I notice from receiving this medication? Side effects that you should report to your doctor or health care professionalas soon as possible: allergic reactions like skin rash, itching or hives, swelling of the  face, lips, or tongue low blood counts - this medicine may decrease the number of white blood cells, red blood cells and platelets. You may be at increased risk for infections and bleeding. signs of infection - fever or chills, cough, sore throat, pain passing urine signs of decreased platelets or bleeding - bruising, pinpoint red spots on the skin, black, tarry stools, blood in the urine signs of decreased red blood cells - unusually weak or tired, fainting spells, lightheadedness signs and symptoms of kidney injury like trouble passing urine or change in the amount of urine signs and symptoms of liver injury like dark yellow or brown urine; general ill feeling or flu-like symptoms; light-colored stools; loss of appetite; nausea; right upper belly pain; unusually weak or tired; yellowing of the eyes or skin Side effects that usually do not require medical attention (report to yourdoctor or health care professional if they continue or are bothersome): constipation diarrhea nausea, vomiting pain or redness at the injection site unusually weak or tired This list may not describe all possible side effects. Call your doctor for medical advice about side effects. You may report side effects to FDA at1-800-FDA-1088. Where should I keep my medication? This drug is given in a hospital or clinic and will not be stored at home. NOTE: This sheet is a summary. It may not cover all possible information. If you have questions about this medicine, talk to your doctor, pharmacist, orhealth care provider.  2022 Elsevier/Gold Standard (2016-11-20 14:37:51)  

## 2021-06-01 ENCOUNTER — Inpatient Hospital Stay: Payer: Medicare Other

## 2021-06-01 VITALS — BP 156/67 | HR 73 | Temp 98.0°F | Resp 16

## 2021-06-01 DIAGNOSIS — Z5111 Encounter for antineoplastic chemotherapy: Secondary | ICD-10-CM | POA: Diagnosis not present

## 2021-06-01 DIAGNOSIS — C9500 Acute leukemia of unspecified cell type not having achieved remission: Secondary | ICD-10-CM

## 2021-06-01 MED ORDER — SODIUM CHLORIDE 0.9 % IV SOLN
Freq: Once | INTRAVENOUS | Status: AC
  Filled 2021-06-01: qty 250

## 2021-06-01 MED ORDER — HEPARIN SOD (PORK) LOCK FLUSH 100 UNIT/ML IV SOLN
500.0000 [IU] | Freq: Once | INTRAVENOUS | Status: AC | PRN
  Administered 2021-06-01: 500 [IU]
  Filled 2021-06-01: qty 5

## 2021-06-01 MED ORDER — SODIUM CHLORIDE 0.9% FLUSH
10.0000 mL | INTRAVENOUS | Status: DC | PRN
Start: 2021-06-01 — End: 2021-06-01
  Administered 2021-06-01: 10 mL
  Filled 2021-06-01: qty 10

## 2021-06-01 MED ORDER — SODIUM CHLORIDE 0.9 % IV SOLN
49.5000 mg/m2 | Freq: Once | INTRAVENOUS | Status: AC
  Administered 2021-06-01: 100 mg via INTRAVENOUS
  Filled 2021-06-01: qty 10

## 2021-06-01 MED ORDER — SODIUM CHLORIDE 0.9 % IV SOLN
10.0000 mg | Freq: Once | INTRAVENOUS | Status: AC
  Administered 2021-06-01: 10 mg via INTRAVENOUS
  Filled 2021-06-01: qty 10

## 2021-06-01 NOTE — Patient Instructions (Signed)
Azacitidine suspension for injection (subcutaneous use) What is this medication? AZACITIDINE (ay za SITE i deen) is a chemotherapy drug. This medicine reduces the growth of cancer cells and can suppress the immune system. It is used fortreating myelodysplastic syndrome or some types of leukemia. This medicine may be used for other purposes; ask your health care provider orpharmacist if you have questions. COMMON BRAND NAME(S): Vidaza What should I tell my care team before I take this medication? They need to know if you have any of these conditions: kidney disease liver disease liver tumors an unusual or allergic reaction to azacitidine, mannitol, other medicines, foods, dyes, or preservatives pregnant or trying to get pregnant breast-feeding How should I use this medication? This medicine is for injection under the skin. It is administered in a hospitalor clinic by a specially trained health care professional. Talk to your pediatrician regarding the use of this medicine in children. Whilethis drug may be prescribed for selected conditions, precautions do apply. Overdosage: If you think you have taken too much of this medicine contact apoison control center or emergency room at once. NOTE: This medicine is only for you. Do not share this medicine with others. What if I miss a dose? It is important not to miss your dose. Call your doctor or health careprofessional if you are unable to keep an appointment. What may interact with this medication? Interactions have not been studied. Give your health care provider a list of all the medicines, herbs, non-prescription drugs, or dietary supplements you use. Also tell them if you smoke, drink alcohol, or use illegal drugs. Some items may interact with yourmedicine. This list may not describe all possible interactions. Give your health care provider a list of all the medicines, herbs, non-prescription drugs, or dietary supplements you use. Also tell  them if you smoke, drink alcohol, or use illegaldrugs. Some items may interact with your medicine. What should I watch for while using this medication? Visit your doctor for checks on your progress. This drug may make you feel generally unwell. This is not uncommon, as chemotherapy can affect healthy cells as well as cancer cells. Report any side effects. Continue your course oftreatment even though you feel ill unless your doctor tells you to stop. In some cases, you may be given additional medicines to help with side effects.Follow all directions for their use. Call your doctor or health care professional for advice if you get a fever, chills or sore throat, or other symptoms of a cold or flu. Do not treat yourself. This drug decreases your body's ability to fight infections. Try toavoid being around people who are sick. This medicine may increase your risk to bruise or bleed. Call your doctor orhealth care professional if you notice any unusual bleeding. You may need blood work done while you are taking this medicine. Do not become pregnant while taking this medicine and for 6 months after the last dose. Women should inform their doctor if they wish to become pregnant or think they might be pregnant. Men should not father a child while taking this medicine and for 3 months after the last dose. There is a potential for serious side effects to an unborn child. Talk to your health care professional or pharmacist for more information. Do not breast-feed an infant while taking thismedicine and for 1 week after the last dose. This medicine may interfere with the ability to have a child. Talk with yourdoctor or health care professional if you are concerned about your fertility.   What side effects may I notice from receiving this medication? Side effects that you should report to your doctor or health care professionalas soon as possible: allergic reactions like skin rash, itching or hives, swelling of the  face, lips, or tongue low blood counts - this medicine may decrease the number of white blood cells, red blood cells and platelets. You may be at increased risk for infections and bleeding. signs of infection - fever or chills, cough, sore throat, pain passing urine signs of decreased platelets or bleeding - bruising, pinpoint red spots on the skin, black, tarry stools, blood in the urine signs of decreased red blood cells - unusually weak or tired, fainting spells, lightheadedness signs and symptoms of kidney injury like trouble passing urine or change in the amount of urine signs and symptoms of liver injury like dark yellow or brown urine; general ill feeling or flu-like symptoms; light-colored stools; loss of appetite; nausea; right upper belly pain; unusually weak or tired; yellowing of the eyes or skin Side effects that usually do not require medical attention (report to yourdoctor or health care professional if they continue or are bothersome): constipation diarrhea nausea, vomiting pain or redness at the injection site unusually weak or tired This list may not describe all possible side effects. Call your doctor for medical advice about side effects. You may report side effects to FDA at1-800-FDA-1088. Where should I keep my medication? This drug is given in a hospital or clinic and will not be stored at home. NOTE: This sheet is a summary. It may not cover all possible information. If you have questions about this medicine, talk to your doctor, pharmacist, orhealth care provider.  2022 Elsevier/Gold Standard (2016-11-20 14:37:51)  

## 2021-06-02 ENCOUNTER — Inpatient Hospital Stay: Payer: Medicare Other

## 2021-06-02 ENCOUNTER — Other Ambulatory Visit: Payer: Self-pay

## 2021-06-02 ENCOUNTER — Other Ambulatory Visit: Payer: Self-pay | Admitting: *Deleted

## 2021-06-02 VITALS — BP 133/49 | HR 73 | Temp 98.0°F | Resp 16

## 2021-06-02 DIAGNOSIS — Z5111 Encounter for antineoplastic chemotherapy: Secondary | ICD-10-CM | POA: Diagnosis not present

## 2021-06-02 DIAGNOSIS — C9500 Acute leukemia of unspecified cell type not having achieved remission: Secondary | ICD-10-CM

## 2021-06-02 MED ORDER — PALONOSETRON HCL INJECTION 0.25 MG/5ML
0.2500 mg | Freq: Once | INTRAVENOUS | Status: AC
  Administered 2021-06-02: 0.25 mg via INTRAVENOUS

## 2021-06-02 MED ORDER — SODIUM CHLORIDE 0.9 % IV SOLN
49.5000 mg/m2 | Freq: Once | INTRAVENOUS | Status: AC
  Administered 2021-06-02: 100 mg via INTRAVENOUS
  Filled 2021-06-02: qty 10

## 2021-06-02 MED ORDER — SODIUM CHLORIDE 0.9% FLUSH
10.0000 mL | INTRAVENOUS | Status: DC | PRN
  Administered 2021-06-02: 10 mL
  Filled 2021-06-02: qty 10

## 2021-06-02 MED ORDER — PALONOSETRON HCL INJECTION 0.25 MG/5ML
INTRAVENOUS | Status: AC
  Filled 2021-06-02: qty 5

## 2021-06-02 MED ORDER — SODIUM CHLORIDE 0.9 % IV SOLN
10.0000 mg | Freq: Once | INTRAVENOUS | Status: AC
  Administered 2021-06-02: 10 mg via INTRAVENOUS
  Filled 2021-06-02: qty 10

## 2021-06-02 MED ORDER — HEPARIN SOD (PORK) LOCK FLUSH 100 UNIT/ML IV SOLN
500.0000 [IU] | Freq: Once | INTRAVENOUS | Status: AC | PRN
  Administered 2021-06-02: 500 [IU]
  Filled 2021-06-02: qty 5

## 2021-06-02 MED ORDER — SODIUM CHLORIDE 0.9 % IV SOLN
Freq: Once | INTRAVENOUS | Status: AC
  Filled 2021-06-02: qty 250

## 2021-06-02 NOTE — Patient Instructions (Signed)
Azacitidine suspension for injection (subcutaneous use) What is this medication? AZACITIDINE (ay za SITE i deen) is a chemotherapy drug. This medicine reduces the growth of cancer cells and can suppress the immune system. It is used fortreating myelodysplastic syndrome or some types of leukemia. This medicine may be used for other purposes; ask your health care provider orpharmacist if you have questions. COMMON BRAND NAME(S): Vidaza What should I tell my care team before I take this medication? They need to know if you have any of these conditions: kidney disease liver disease liver tumors an unusual or allergic reaction to azacitidine, mannitol, other medicines, foods, dyes, or preservatives pregnant or trying to get pregnant breast-feeding How should I use this medication? This medicine is for injection under the skin. It is administered in a hospitalor clinic by a specially trained health care professional. Talk to your pediatrician regarding the use of this medicine in children. Whilethis drug may be prescribed for selected conditions, precautions do apply. Overdosage: If you think you have taken too much of this medicine contact apoison control center or emergency room at once. NOTE: This medicine is only for you. Do not share this medicine with others. What if I miss a dose? It is important not to miss your dose. Call your doctor or health careprofessional if you are unable to keep an appointment. What may interact with this medication? Interactions have not been studied. Give your health care provider a list of all the medicines, herbs, non-prescription drugs, or dietary supplements you use. Also tell them if you smoke, drink alcohol, or use illegal drugs. Some items may interact with yourmedicine. This list may not describe all possible interactions. Give your health care provider a list of all the medicines, herbs, non-prescription drugs, or dietary supplements you use. Also tell  them if you smoke, drink alcohol, or use illegaldrugs. Some items may interact with your medicine. What should I watch for while using this medication? Visit your doctor for checks on your progress. This drug may make you feel generally unwell. This is not uncommon, as chemotherapy can affect healthy cells as well as cancer cells. Report any side effects. Continue your course oftreatment even though you feel ill unless your doctor tells you to stop. In some cases, you may be given additional medicines to help with side effects.Follow all directions for their use. Call your doctor or health care professional for advice if you get a fever, chills or sore throat, or other symptoms of a cold or flu. Do not treat yourself. This drug decreases your body's ability to fight infections. Try toavoid being around people who are sick. This medicine may increase your risk to bruise or bleed. Call your doctor orhealth care professional if you notice any unusual bleeding. You may need blood work done while you are taking this medicine. Do not become pregnant while taking this medicine and for 6 months after the last dose. Women should inform their doctor if they wish to become pregnant or think they might be pregnant. Men should not father a child while taking this medicine and for 3 months after the last dose. There is a potential for serious side effects to an unborn child. Talk to your health care professional or pharmacist for more information. Do not breast-feed an infant while taking thismedicine and for 1 week after the last dose. This medicine may interfere with the ability to have a child. Talk with yourdoctor or health care professional if you are concerned about your fertility.   What side effects may I notice from receiving this medication? Side effects that you should report to your doctor or health care professionalas soon as possible: allergic reactions like skin rash, itching or hives, swelling of the  face, lips, or tongue low blood counts - this medicine may decrease the number of white blood cells, red blood cells and platelets. You may be at increased risk for infections and bleeding. signs of infection - fever or chills, cough, sore throat, pain passing urine signs of decreased platelets or bleeding - bruising, pinpoint red spots on the skin, black, tarry stools, blood in the urine signs of decreased red blood cells - unusually weak or tired, fainting spells, lightheadedness signs and symptoms of kidney injury like trouble passing urine or change in the amount of urine signs and symptoms of liver injury like dark yellow or brown urine; general ill feeling or flu-like symptoms; light-colored stools; loss of appetite; nausea; right upper belly pain; unusually weak or tired; yellowing of the eyes or skin Side effects that usually do not require medical attention (report to yourdoctor or health care professional if they continue or are bothersome): constipation diarrhea nausea, vomiting pain or redness at the injection site unusually weak or tired This list may not describe all possible side effects. Call your doctor for medical advice about side effects. You may report side effects to FDA at1-800-FDA-1088. Where should I keep my medication? This drug is given in a hospital or clinic and will not be stored at home. NOTE: This sheet is a summary. It may not cover all possible information. If you have questions about this medicine, talk to your doctor, pharmacist, orhealth care provider.  2022 Elsevier/Gold Standard (2016-11-20 14:37:51)  

## 2021-06-05 ENCOUNTER — Inpatient Hospital Stay: Payer: Medicare Other

## 2021-06-05 ENCOUNTER — Other Ambulatory Visit: Payer: Self-pay

## 2021-06-05 ENCOUNTER — Inpatient Hospital Stay: Payer: Medicare Other | Attending: Oncology

## 2021-06-05 VITALS — BP 124/76 | HR 80 | Temp 97.9°F | Resp 17

## 2021-06-05 DIAGNOSIS — C9500 Acute leukemia of unspecified cell type not having achieved remission: Secondary | ICD-10-CM

## 2021-06-05 DIAGNOSIS — C9200 Acute myeloblastic leukemia, not having achieved remission: Secondary | ICD-10-CM | POA: Insufficient documentation

## 2021-06-05 DIAGNOSIS — Z452 Encounter for adjustment and management of vascular access device: Secondary | ICD-10-CM

## 2021-06-05 LAB — CBC WITH DIFFERENTIAL (CANCER CENTER ONLY)
Abs Immature Granulocytes: 0.11 10*3/uL — ABNORMAL HIGH (ref 0.00–0.07)
Basophils Absolute: 0 10*3/uL (ref 0.0–0.1)
Basophils Relative: 0 %
Eosinophils Absolute: 0 10*3/uL (ref 0.0–0.5)
Eosinophils Relative: 0 %
HCT: 36.3 % — ABNORMAL LOW (ref 39.0–52.0)
Hemoglobin: 11.7 g/dL — ABNORMAL LOW (ref 13.0–17.0)
Immature Granulocytes: 2 %
Lymphocytes Relative: 14 %
Lymphs Abs: 0.8 10*3/uL (ref 0.7–4.0)
MCH: 30.6 pg (ref 26.0–34.0)
MCHC: 32.2 g/dL (ref 30.0–36.0)
MCV: 95 fL (ref 80.0–100.0)
Monocytes Absolute: 0.4 10*3/uL (ref 0.1–1.0)
Monocytes Relative: 7 %
Neutro Abs: 4.3 10*3/uL (ref 1.7–7.7)
Neutrophils Relative %: 77 %
Platelet Count: 67 10*3/uL — ABNORMAL LOW (ref 150–400)
RBC: 3.82 MIL/uL — ABNORMAL LOW (ref 4.22–5.81)
RDW: 16.6 % — ABNORMAL HIGH (ref 11.5–15.5)
WBC Count: 5.6 10*3/uL (ref 4.0–10.5)
nRBC: 0.4 % — ABNORMAL HIGH (ref 0.0–0.2)

## 2021-06-05 LAB — CMP (CANCER CENTER ONLY)
ALT: 10 U/L (ref 0–44)
AST: 10 U/L — ABNORMAL LOW (ref 15–41)
Albumin: 3.5 g/dL (ref 3.5–5.0)
Alkaline Phosphatase: 62 U/L (ref 38–126)
Anion gap: 7 (ref 5–15)
BUN: 20 mg/dL (ref 8–23)
CO2: 29 mmol/L (ref 22–32)
Calcium: 8.2 mg/dL — ABNORMAL LOW (ref 8.9–10.3)
Chloride: 105 mmol/L (ref 98–111)
Creatinine: 1.15 mg/dL (ref 0.61–1.24)
GFR, Estimated: >60 mL/min (ref >60)
Glucose, Bld: 98 mg/dL (ref 70–99)
Potassium: 3.3 mmol/L — ABNORMAL LOW (ref 3.5–5.1)
Sodium: 141 mmol/L (ref 135–145)
Total Bilirubin: 0.7 mg/dL (ref 0.3–1.2)
Total Protein: 4.9 g/dL — ABNORMAL LOW (ref 6.5–8.1)

## 2021-06-05 MED ORDER — SODIUM CHLORIDE 0.9% FLUSH
10.0000 mL | Freq: Once | INTRAVENOUS | Status: AC
  Administered 2021-06-05: 10 mL
  Filled 2021-06-05: qty 10

## 2021-06-05 MED ORDER — HEPARIN SOD (PORK) LOCK FLUSH 100 UNIT/ML IV SOLN
250.0000 [IU] | Freq: Once | INTRAVENOUS | Status: AC
Start: 2021-06-05 — End: 2021-06-05
  Administered 2021-06-05: 500 [IU]
  Filled 2021-06-05: qty 5

## 2021-06-05 NOTE — Patient Instructions (Signed)
Implanted Port Home Guide An implanted port is a device that is placed under the skin. It is usually placed in the chest. The device can be used to give IV medicine, to take blood, or for dialysis. You may have an implanted port if: You need IV medicine that would be irritating to the small veins in your hands or arms. You need IV medicines, such as antibiotics, for a long period of time. You need IV nutrition for a long period of time. You need dialysis. When you have a port, your health care provider can choose to use the port instead of veins in your arms for these procedures. You may have fewer limitations when using a port than you would if you used other types of long-term IVs, and you will likely be able to return to normal activities afteryour incision heals. An implanted port has two main parts: Reservoir. The reservoir is the part where a needle is inserted to give medicines or draw blood. The reservoir is round. After it is placed, it appears as a small, raised area under your skin. Catheter. The catheter is a thin, flexible tube that connects the reservoir to a vein. Medicine that is inserted into the reservoir goes into the catheter and then into the vein. How is my port accessed? To access your port: A numbing cream may be placed on the skin over the port site. Your health care provider will put on a mask and sterile gloves. The skin over your port will be cleaned carefully with a germ-killing soap and allowed to dry. Your health care provider will gently pinch the port and insert a needle into it. Your health care provider will check for a blood return to make sure the port is in the vein and is not clogged. If your port needs to remain accessed to get medicine continuously (constant infusion), your health care provider will place a clear bandage (dressing) over the needle site. The dressing and needle will need to be changed every week, or as told by your health care provider. What  is flushing? Flushing helps keep the port from getting clogged. Follow instructions from your health care provider about how and when to flush the port. Ports are usually flushed with saline solution or a medicine called heparin. The need for flushing will depend on how the port is used: If the port is only used from time to time to give medicines or draw blood, the port may need to be flushed: Before and after medicines have been given. Before and after blood has been drawn. As part of routine maintenance. Flushing may be recommended every 4-6 weeks. If a constant infusion is running, the port may not need to be flushed. Throw away any syringes in a disposal container that is meant for sharp items (sharps container). You can buy a sharps container from a pharmacy, or you can make one by using an empty hard plastic bottle with a cover. How long will my port stay implanted? The port can stay in for as long as your health care provider thinks it is needed. When it is time for the port to come out, a surgery will be done to remove it. The surgery will be similar to the procedure that was done to putthe port in. Follow these instructions at home:  Flush your port as told by your health care provider. If you need an infusion over several days, follow instructions from your health care provider about how to take   care of your port site. Make sure you: Wash your hands with soap and water before you change your dressing. If soap and water are not available, use alcohol-based hand sanitizer. Change your dressing as told by your health care provider. Place any used dressings or infusion bags into a plastic bag. Throw that bag in the trash. Keep the dressing that covers the needle clean and dry. Do not get it wet. Do not use scissors or sharp objects near the tube. Keep the tube clamped, unless it is being used. Check your port site every day for signs of infection. Check for: Redness, swelling, or  pain. Fluid or blood. Pus or a bad smell. Protect the skin around the port site. Avoid wearing bra straps that rub or irritate the site. Protect the skin around your port from seat belts. Place a soft pad over your chest if needed. Bathe or shower as told by your health care provider. The site may get wet as long as you are not actively receiving an infusion. Return to your normal activities as told by your health care provider. Ask your health care provider what activities are safe for you. Carry a medical alert card or wear a medical alert bracelet at all times. This will let health care providers know that you have an implanted port in case of an emergency. Get help right away if: You have redness, swelling, or pain at the port site. You have fluid or blood coming from your port site. You have pus or a bad smell coming from the port site. You have a fever. Summary Implanted ports are usually placed in the chest for long-term IV access. Follow instructions from your health care provider about flushing the port and changing bandages (dressings). Take care of the area around your port by avoiding clothing that puts pressure on the area, and by watching for signs of infection. Protect the skin around your port from seat belts. Place a soft pad over your chest if needed. Get help right away if you have a fever or you have redness, swelling, pain, drainage, or a bad smell at the port site. This information is not intended to replace advice given to you by your health care provider. Make sure you discuss any questions you have with your healthcare provider. Document Revised: 03/07/2020 Document Reviewed: 03/07/2020 Elsevier Patient Education  2022 Elsevier Inc.  

## 2021-06-06 ENCOUNTER — Ambulatory Visit (INDEPENDENT_AMBULATORY_CARE_PROVIDER_SITE_OTHER): Payer: Medicare Other | Admitting: Family Medicine

## 2021-06-06 ENCOUNTER — Encounter: Payer: Self-pay | Admitting: Family Medicine

## 2021-06-06 VITALS — BP 116/67 | HR 81 | Temp 97.0°F | Ht 70.0 in | Wt 211.0 lb

## 2021-06-06 DIAGNOSIS — J329 Chronic sinusitis, unspecified: Secondary | ICD-10-CM

## 2021-06-06 DIAGNOSIS — E890 Postprocedural hypothyroidism: Secondary | ICD-10-CM | POA: Diagnosis not present

## 2021-06-06 DIAGNOSIS — D020 Carcinoma in situ of larynx: Secondary | ICD-10-CM

## 2021-06-06 DIAGNOSIS — E860 Dehydration: Secondary | ICD-10-CM

## 2021-06-06 DIAGNOSIS — R1313 Dysphagia, pharyngeal phase: Secondary | ICD-10-CM

## 2021-06-06 LAB — TSH: TSH: 0.12 u[IU]/mL — ABNORMAL LOW (ref 0.35–5.50)

## 2021-06-06 LAB — URINALYSIS, ROUTINE W REFLEX MICROSCOPIC
Bilirubin Urine: NEGATIVE
Ketones, ur: NEGATIVE
Leukocytes,Ua: NEGATIVE
Nitrite: NEGATIVE
Specific Gravity, Urine: 1.02 (ref 1.000–1.030)
Total Protein, Urine: NEGATIVE
Urine Glucose: NEGATIVE
Urobilinogen, UA: 0.2 (ref 0.0–1.0)
WBC, UA: NONE SEEN (ref 0–?)
pH: 6 (ref 5.0–8.0)

## 2021-06-06 MED ORDER — AMOXICILLIN 500 MG PO CAPS: 500.0000 mg | ORAL_CAPSULE | Freq: Three times a day (TID) | ORAL | 0 refills | Status: DC

## 2021-06-06 MED ORDER — AMOXICILLIN-POT CLAVULANATE 875-125 MG PO TABS: 1.0000 | ORAL_TABLET | Freq: Two times a day (BID) | ORAL | 0 refills | Status: DC

## 2021-06-06 NOTE — Progress Notes (Addendum)
Established Patient Office Visit  Subjective:  Patient ID: Victor [name], male    DOB: January 03, 1936  Age: 85 y.o. MRN: [id number]  CC:  Chief Complaint  Patient presents with   Follow-up    Concerns about issues with swallowing feels like things get caught in his throat causing cough.     HPI Victor [name] presents for a 2 to 3-week history of postnasal drip that he says has been yellow and green.  He denies facial pressure or teeth pain fevers chills nausea or vomiting.  Nonproductive cough.  He does not suffer from seasonal allergies.  Tells of solid food dysphagia in the upper lower half of his neck area.  Liquids pass without issue mostly.  There is occasional choking.  Mouth has been dry over the last few weeks.  He is not certain about whether or not he has an ongoing history of dry mouth.  History of GERD mostly controlled with Prilosec.  He awakens occasionally with a brackish taste in the back of his mouth.  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  Status post recent follow-up with ENT back in December.  He does feel as though there is been a change in his voice.  His wife is present for the exam and she concurs.  History of tobacco use.  He does snore some without apneic episodes.  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  Past Medical History:  Diagnosis Date   Arthritis    Atherosclerosis    Atherosclerotic PVD with intermittent claudication (HCC)    stent left leg   BPH (benign prostatic hyperplasia)    Bulging lumbar disc    Cancer ([place]) 11/12/14   vocal cord  carcinoma in situ , radiation; thyroid cancer   Carotid bruit    COPD (chronic obstructive pulmonary disease) (HCC)    DDD (degenerative disc disease), lumbar    Dysphonia    Dysplasia of true vocal cord    GERD (gastroesophageal reflux disease)    H/O carotid atherosclerosis    b/l   Hyperlipidemia    Hypothyroidism    Occasional tremors    left hand managed with propranolol    Peripheral vascular angioplasty status with implants and grafts    Precancerous lesion 03/05/2018   premelanoma removed from back.    Radiation 03/10/15- 04/18/16   Larynx   Sleep apnea    hasn't used CPAP in years   Spondylosis of lumbosacral region    Thyroid disease     Past Surgical History:  Procedure Laterality Date   APPENDECTOMY     COLONOSCOPY W/ POLYPECTOMY     DIODE LASER APPLICATION Left 54/00/8676   Procedure: DIODE LASER APPLICATION;  Surgeon: Hayden Pedro, MD;  Location: [place];  Service: Ophthalmology;  Laterality: Left;   IR IMAGING GUIDED PORT INSERTION  01/16/2021   IR IVC FILTER PLMT / S&I /IMG GUID/MOD SED  03/17/2021   MEMBRANE PEEL Left 08/18/2019   Procedure: MEMBRANE PEEL;  Surgeon: Hayden Pedro, MD;  Location: [place];  Service: Ophthalmology;  Laterality: Left;   MOUTH SURGERY     tooth extraction   PARS PLANA VITRECTOMY Left 08/18/2019   PARS PLANA VITRECTOMY 27 GAUGE (Left)   PARS PLANA VITRECTOMY 27 GAUGE Left 08/18/2019   Procedure: PARS PLANA VITRECTOMY 27 GAUGE;  Surgeon: Hayden Pedro, MD;  Location: [place];  Service: Ophthalmology;  Laterality: Left;   PHOTOCOAGULATION WITH LASER Left 08/18/2019  Procedure: PHOTOCOAGULATION WITH LASER;  Surgeon: Hayden Pedro, MD;  Location: [place];  Service: Ophthalmology;  Laterality: Left;   THYROID SURGERY     TONSILLECTOMY     vocal cord biopsy  11/12/14   squamous cell carcinoma in situ    Family History  Problem Relation Age of Onset   Colon cancer Father    Cancer Sister        Died of metastatic cancer   Leukemia Neg Hx     Social History   Socioeconomic History   Marital status: Married    Spouse name: Not on file   Number of children: 2   Years of education: Not on file   Highest education level: Not on file  Occupational History   Not on file  Tobacco Use   Smoking status: Former    Packs/day: 2.00    Years: 51.00    Pack years: 102.00    Types: Cigarettes    Start date:  67    Quit date: 03/05/2004    Years since quitting: 17.2   Smokeless tobacco: Never  Vaping Use   Vaping Use: Never used  Substance and Sexual Activity   Alcohol use: Yes    Alcohol/week: 3.0 standard drinks    Types: 3 Glasses of wine per week    Comment: occ   Drug use: No   Sexual activity: Not on file  Other Topics Concern   Not on file  Social History Narrative   Not on file   Social Determinants of Health   Financial Resource Strain: Not on file  Food Insecurity: Not on file  Transportation Needs: Not on file  Physical Activity: Not on file  Stress: Not on file  Social Connections: Not on file  Intimate Partner Violence: Not on file    Outpatient Medications Prior to Visit  Medication Sig Dispense Refill   acetaminophen (TYLENOL) 500 MG tablet Take 1,000 mg by mouth every 6 (six) hours as needed for moderate pain.     acyclovir (ZOVIRAX) 400 MG tablet Take 1 tablet (400 mg total) by mouth 2 (two) times daily as needed. 60 tablet 2   apixaban (ELIQUIS) 2.5 MG TABS tablet Take 1 tablet (2.5 mg total) by mouth 2 (two) times daily. 60 tablet 6   doxazosin (CARDURA) 2 MG tablet Take 1 tablet (2 mg total) by mouth at bedtime. 90 tablet 3   fluticasone (FLONASE) 50 MCG/ACT nasal spray Place 2 sprays into the nose 2 (two) times daily as needed for allergies.     HYDROcodone-acetaminophen (NORCO/VICODIN) 5-325 MG tablet Take 1 tablet by mouth every 6 (six) hours as needed for moderate pain.     Lidocaine 4 % PTCH Apply topically. Uses 1/2 patch daily.     lidocaine-prilocaine (EMLA) cream Apply 1 application topically as needed for up to 30 doses. 30 g 0   Loratadine (CLARITIN PO) Take 10 mg by mouth at bedtime. Allertec     Melatonin 5 MG CHEW Chew 1 tablet by mouth daily as needed (sleep).     omeprazole (PRILOSEC) 20 MG capsule TAKE 1 CAPSULE BY MOUTH TWICE A DAY BEFORE A MEAL 180 capsule 0   potassium chloride (MICRO-K) 10 MEQ CR capsule Take 10 mEq by mouth in the morning  and at bedtime.     propranolol ER (INDERAL LA) 80 MG 24 hr capsule TAKE ONE CAPSULE BY MOUTH ONE TIME DAILY 90 capsule 0   simvastatin (ZOCOR) 40 MG tablet TAKE ONE  TABLET BY MOUTH DAILY AT BEDTIME (Patient taking differently: Take 40 mg by mouth daily at 6 PM.) 90 tablet 3   venetoclax 100 MG TABS Take 100 mg by mouth in the morning, at noon, in the evening, and at bedtime.     levothyroxine (SYNTHROID) 175 MCG tablet Take 1 tablet (175 mcg total) by mouth daily. 90 tablet 1   fluconazole (DIFLUCAN) 200 MG tablet Take 200 mg by mouth daily. (Patient not taking: No sig reported)     hydrALAZINE (APRESOLINE) 25 MG tablet Take 1 tablet (25 mg total) by mouth 3 (three) times daily as needed. Take if standing BP >140/80 mm Hg 90 tablet 2   levofloxacin (LEVAQUIN) 500 MG tablet Take 500 mg by mouth daily. (Patient not taking: No sig reported)     No facility-administered medications prior to visit.    No Known Allergies  ROS Review of Systems  Constitutional:  Positive for fatigue. Negative for diaphoresis, fever and unexpected weight change.  HENT:  Positive for congestion, postnasal drip, trouble swallowing and voice change. Negative for rhinorrhea, sinus pressure, sinus pain and sneezing.   Eyes:  Negative for photophobia and visual disturbance.  Respiratory:  Positive for cough. Negative for apnea, shortness of breath and wheezing.   Cardiovascular: Negative.   Gastrointestinal:  Negative for nausea and vomiting.  Genitourinary: Negative.   Psychiatric/Behavioral:  Positive for sleep disturbance.      Objective:    Physical Exam Constitutional:      General: He is not in acute distress.    Appearance: Normal appearance. He is obese. He is ill-appearing. He is not toxic-appearing or diaphoretic.  HENT:     Head: Normocephalic and atraumatic.     Right Ear: Tympanic membrane, ear canal and external ear normal.     Left Ear: Tympanic membrane, ear canal and external ear normal.      Mouth/Throat:     Mouth: Mucous membranes are dry.     Pharynx: Oropharynx is clear. No oropharyngeal exudate or posterior oropharyngeal erythema.   Eyes:     Extraocular Movements: Extraocular movements intact.     Conjunctiva/sclera: Conjunctivae normal.     Pupils: Pupils are equal, round, and reactive to light.  Cardiovascular:     Rate and Rhythm: Normal rate and regular rhythm.  Pulmonary:     Effort: Pulmonary effort is normal.     Breath sounds: Normal breath sounds. No rales.  Abdominal:     General: Bowel sounds are normal.  Musculoskeletal:     Cervical back: No rigidity or tenderness.  Lymphadenopathy:     Cervical: No cervical adenopathy.  Skin:    General: Skin is warm and dry.  Neurological:     Mental Status: He is alert and oriented to person, place, and time.    BP 116/67   Pulse 81   Temp (!) 97 F (36.1 C) (Temporal)   Ht 5' 10"  (1.778 m)   Wt 211 lb (95.7 kg)   SpO2 94%   BMI 30.28 kg/m  Wt Readings from Last 3 Encounters:  06/06/21 211 lb (95.7 kg)  05/29/21 211 lb (95.7 kg)  05/29/21 211 lb (95.7 kg)     Health Maintenance Due  Topic Date Due   Zoster Vaccines- Shingrix (2 of 2) 11/11/2017   INFLUENZA VACCINE  06/05/2021    There are no preventive care reminders to display for this patient.  Lab Results  Component Value Date   TSH 0.12 (L) 06/06/2021  Lab Results  Component Value Date   WBC 5.6 06/05/2021   HGB 11.7 (L) 06/05/2021   HCT 36.3 (L) 06/05/2021   MCV 95.0 06/05/2021   PLT 67 (L) 06/05/2021   Lab Results  Component Value Date   NA 141 06/05/2021   K 3.3 (L) 06/05/2021   CHLORIDE 105 03/30/2015   CO2 29 06/05/2021   GLUCOSE 98 06/05/2021   BUN 20 06/05/2021   CREATININE 1.15 06/05/2021   BILITOT 0.7 06/05/2021   ALKPHOS 62 06/05/2021   AST 10 (L) 06/05/2021   ALT 10 06/05/2021   PROT 4.9 (L) 06/05/2021   ALBUMIN 3.5 06/05/2021   CALCIUM 8.2 (L) 06/05/2021   ANIONGAP 7 06/05/2021   EGFR 57 (L) 03/30/2015    GFR 67.69 09/17/2019   Lab Results  Component Value Date   CHOL 157 03/23/2021   Lab Results  Component Value Date   HDL 34 (L) 03/23/2021   Lab Results  Component Value Date   LDLCALC 99 03/23/2021   Lab Results  Component Value Date   TRIG 132 03/23/2021   Lab Results  Component Value Date   CHOLHDL 4 11/12/2018   No results found for: HGBA1C    Assessment & Plan:   Problem List Items Addressed This Visit       Respiratory   Carcinoma in situ of vocal cord   Relevant Orders   Ambulatory referral to ENT   Pharyngeal dysphagia - Primary   Relevant Orders   Ambulatory referral to ENT   Sinusitis   Relevant Medications   amoxicillin-clavulanate (AUGMENTIN) 875-125 MG tablet     Endocrine   Postoperative hypothyroidism   Relevant Medications   levothyroxine (SYNTHROID) 150 MCG tablet   Other Relevant Orders   TSH (Completed)     Other   Dehydration   Relevant Orders   Urinalysis, Routine w reflex microscopic (Completed)    Meds ordered this encounter  Medications   DISCONTD: amoxicillin (AMOXIL) 500 MG capsule    Sig: Take 1 capsule (500 mg total) by mouth 3 (three) times daily for 10 days.    Dispense:  30 capsule    Refill:  0   amoxicillin-clavulanate (AUGMENTIN) 875-125 MG tablet    Sig: Take 1 tablet by mouth 2 (two) times daily for 10 days.    Dispense:  20 tablet    Refill:  0    Discontinue Rx for amoxil.   levothyroxine (SYNTHROID) 150 MCG tablet    Sig: Take 1 tablet (150 mcg total) by mouth daily.    Dispense:  90 tablet    Refill:  3     Follow-up: Return follow up with me after ENT visit if problem continues. Hydrate well. Consider biotene rinse..  Increase hydration.  Consider Biotene rinse for dry mouth.  Dysphagia could be due to to dehydration.  Patient is concerned about its relationship with his history of vocal cord cancer.  Refer back to ENT for consideration of nasopharyngoscopy.  Follow back up with me after ENT visit.   Spent over 40 minutes with this patient reviewing the chart his past medical history exam and plan of care.  [name], MD  8/2 addendum: pt told my cma that he had been on amoxil recently and it didn't help.

## 2021-06-06 NOTE — Addendum Note (Signed)
Addended by: Jon Billings on: 06/06/2021 02:26 PM   Modules accepted: Orders

## 2021-06-08 MED ORDER — LEVOTHYROXINE SODIUM 150 MCG PO TABS: 150.0000 ug | ORAL_TABLET | Freq: Every day | ORAL | 3 refills | Status: AC

## 2021-06-08 NOTE — Addendum Note (Signed)
Addended by: Jon Billings on: 06/08/2021 07:53 AM   Modules accepted: Orders

## 2021-06-12 ENCOUNTER — Telehealth: Payer: Self-pay | Admitting: Family Medicine

## 2021-06-12 ENCOUNTER — Inpatient Hospital Stay: Payer: Medicare Other

## 2021-06-12 ENCOUNTER — Other Ambulatory Visit: Payer: Self-pay

## 2021-06-12 ENCOUNTER — Other Ambulatory Visit: Payer: Self-pay | Admitting: *Deleted

## 2021-06-12 VITALS — BP 130/55 | HR 89 | Resp 16

## 2021-06-12 DIAGNOSIS — Z95828 Presence of other vascular implants and grafts: Secondary | ICD-10-CM

## 2021-06-12 DIAGNOSIS — C9500 Acute leukemia of unspecified cell type not having achieved remission: Secondary | ICD-10-CM

## 2021-06-12 DIAGNOSIS — R0602 Shortness of breath: Secondary | ICD-10-CM | POA: Diagnosis not present

## 2021-06-12 DIAGNOSIS — J189 Pneumonia, unspecified organism: Secondary | ICD-10-CM | POA: Diagnosis not present

## 2021-06-12 LAB — CBC WITH DIFFERENTIAL (CANCER CENTER ONLY)
Abs Immature Granulocytes: 0.02 10*3/uL (ref 0.00–0.07)
Basophils Absolute: 0 10*3/uL (ref 0.0–0.1)
Basophils Relative: 1 %
Eosinophils Absolute: 0 10*3/uL (ref 0.0–0.5)
Eosinophils Relative: 0 %
HCT: 32.1 % — ABNORMAL LOW (ref 39.0–52.0)
Hemoglobin: 10.4 g/dL — ABNORMAL LOW (ref 13.0–17.0)
Immature Granulocytes: 1 %
Lymphocytes Relative: 25 %
Lymphs Abs: 0.4 10*3/uL — ABNORMAL LOW (ref 0.7–4.0)
MCH: 30 pg (ref 26.0–34.0)
MCHC: 32.4 g/dL (ref 30.0–36.0)
MCV: 92.5 fL (ref 80.0–100.0)
Monocytes Absolute: 0.1 10*3/uL (ref 0.1–1.0)
Monocytes Relative: 4 %
Neutro Abs: 1.1 10*3/uL — ABNORMAL LOW (ref 1.7–7.7)
Neutrophils Relative %: 69 %
Platelet Count: 32 10*3/uL — ABNORMAL LOW (ref 150–400)
RBC: 3.47 MIL/uL — ABNORMAL LOW (ref 4.22–5.81)
RDW: 16.8 % — ABNORMAL HIGH (ref 11.5–15.5)
WBC Count: 1.6 10*3/uL — ABNORMAL LOW (ref 4.0–10.5)
nRBC: 0 % (ref 0.0–0.2)

## 2021-06-12 LAB — CMP (CANCER CENTER ONLY)
ALT: 11 U/L (ref 0–44)
AST: 9 U/L — ABNORMAL LOW (ref 15–41)
Albumin: 3.3 g/dL — ABNORMAL LOW (ref 3.5–5.0)
Alkaline Phosphatase: 62 U/L (ref 38–126)
Anion gap: 9 (ref 5–15)
BUN: 11 mg/dL (ref 8–23)
CO2: 26 mmol/L (ref 22–32)
Calcium: 8.5 mg/dL — ABNORMAL LOW (ref 8.9–10.3)
Chloride: 98 mmol/L (ref 98–111)
Creatinine: 1 mg/dL (ref 0.61–1.24)
GFR, Estimated: >60 mL/min (ref >60)
Glucose, Bld: 133 mg/dL — ABNORMAL HIGH (ref 70–99)
Potassium: 3.9 mmol/L (ref 3.5–5.1)
Sodium: 133 mmol/L — ABNORMAL LOW (ref 135–145)
Total Bilirubin: 1.2 mg/dL (ref 0.3–1.2)
Total Protein: 5.5 g/dL — ABNORMAL LOW (ref 6.5–8.1)

## 2021-06-12 LAB — MAGNESIUM: Magnesium: 1.9 mg/dL (ref 1.7–2.4)

## 2021-06-12 MED ORDER — HEPARIN SOD (PORK) LOCK FLUSH 100 UNIT/ML IV SOLN
500.0000 [IU] | Freq: Once | INTRAVENOUS | Status: AC
  Administered 2021-06-12: 500 [IU] via INTRAVENOUS
  Filled 2021-06-12: qty 5

## 2021-06-12 MED ORDER — SODIUM CHLORIDE 0.9% FLUSH
10.0000 mL | Freq: Once | INTRAVENOUS | Status: AC
  Administered 2021-06-12: 10 mL via INTRAVENOUS
  Filled 2021-06-12: qty 10

## 2021-06-12 NOTE — Telephone Encounter (Signed)
Please advise message below patient would like to stop antibiotics due to diarrhea complains of SOB with exertion O2 levels at 97%. Patient refused ER visit.

## 2021-06-12 NOTE — Patient Instructions (Signed)
Implanted Port Home Guide An implanted port is a device that is placed under the skin. It is usually placed in the chest. The device can be used to give IV medicine, to take blood, or for dialysis. You may have an implanted port if: You need IV medicine that would be irritating to the small veins in your hands or arms. You need IV medicines, such as antibiotics, for a long period of time. You need IV nutrition for a long period of time. You need dialysis. When you have a port, your health care provider can choose to use the port instead of veins in your arms for these procedures. You may have fewer limitations when using a port than you would if you used other types of long-term IVs, and you will likely be able to return to normal activities afteryour incision heals. An implanted port has two main parts: Reservoir. The reservoir is the part where a needle is inserted to give medicines or draw blood. The reservoir is round. After it is placed, it appears as a small, raised area under your skin. Catheter. The catheter is a thin, flexible tube that connects the reservoir to a vein. Medicine that is inserted into the reservoir goes into the catheter and then into the vein. How is my port accessed? To access your port: A numbing cream may be placed on the skin over the port site. Your health care provider will put on a mask and sterile gloves. The skin over your port will be cleaned carefully with a germ-killing soap and allowed to dry. Your health care provider will gently pinch the port and insert a needle into it. Your health care provider will check for a blood return to make sure the port is in the vein and is not clogged. If your port needs to remain accessed to get medicine continuously (constant infusion), your health care provider will place a clear bandage (dressing) over the needle site. The dressing and needle will need to be changed every week, or as told by your health care provider. What  is flushing? Flushing helps keep the port from getting clogged. Follow instructions from your health care provider about how and when to flush the port. Ports are usually flushed with saline solution or a medicine called heparin. The need for flushing will depend on how the port is used: If the port is only used from time to time to give medicines or draw blood, the port may need to be flushed: Before and after medicines have been given. Before and after blood has been drawn. As part of routine maintenance. Flushing may be recommended every 4-6 weeks. If a constant infusion is running, the port may not need to be flushed. Throw away any syringes in a disposal container that is meant for sharp items (sharps container). You can buy a sharps container from a pharmacy, or you can make one by using an empty hard plastic bottle with a cover. How long will my port stay implanted? The port can stay in for as long as your health care provider thinks it is needed. When it is time for the port to come out, a surgery will be done to remove it. The surgery will be similar to the procedure that was done to putthe port in. Follow these instructions at home:  Flush your port as told by your health care provider. If you need an infusion over several days, follow instructions from your health care provider about how to take   care of your port site. Make sure you: Wash your hands with soap and water before you change your dressing. If soap and water are not available, use alcohol-based hand sanitizer. Change your dressing as told by your health care provider. Place any used dressings or infusion bags into a plastic bag. Throw that bag in the trash. Keep the dressing that covers the needle clean and dry. Do not get it wet. Do not use scissors or sharp objects near the tube. Keep the tube clamped, unless it is being used. Check your port site every day for signs of infection. Check for: Redness, swelling, or  pain. Fluid or blood. Pus or a bad smell. Protect the skin around the port site. Avoid wearing bra straps that rub or irritate the site. Protect the skin around your port from seat belts. Place a soft pad over your chest if needed. Bathe or shower as told by your health care provider. The site may get wet as long as you are not actively receiving an infusion. Return to your normal activities as told by your health care provider. Ask your health care provider what activities are safe for you. Carry a medical alert card or wear a medical alert bracelet at all times. This will let health care providers know that you have an implanted port in case of an emergency. Get help right away if: You have redness, swelling, or pain at the port site. You have fluid or blood coming from your port site. You have pus or a bad smell coming from the port site. You have a fever. Summary Implanted ports are usually placed in the chest for long-term IV access. Follow instructions from your health care provider about flushing the port and changing bandages (dressings). Take care of the area around your port by avoiding clothing that puts pressure on the area, and by watching for signs of infection. Protect the skin around your port from seat belts. Place a soft pad over your chest if needed. Get help right away if you have a fever or you have redness, swelling, pain, drainage, or a bad smell at the port site. This information is not intended to replace advice given to you by your health care provider. Make sure you discuss any questions you have with your healthcare provider. Document Revised: 03/07/2020 Document Reviewed: 03/07/2020 Elsevier Patient Education  2022 Elsevier Inc.  

## 2021-06-12 NOTE — Telephone Encounter (Signed)
FYI:Pt is calling in stating he has started taking amoxicillin-clavulanate (AUGMENTIN) 875-125 MG tablet [[id number]]  recently. He feels loopy, dead tired, his urine has changed color. He is also sob. I was transferring pt over to Nurse Triage and he got disconnected. She was sending this message straight over to the nurse.

## 2021-06-12 NOTE — Addendum Note (Signed)
Addended by: Fabio Neighbors A on: 06/12/2021 11:25 AM   Modules accepted: Orders

## 2021-06-12 NOTE — Telephone Encounter (Signed)
Pt having difficulty breathing (only with exertion). Has had diarrhea for 4/5 days, oxygen level was 97 % when he checked.   This was all relayed from a call to the doctor line...  Said he refused to go to ER, just wants permission to be off of his antibiotic.

## 2021-06-13 ENCOUNTER — Other Ambulatory Visit: Payer: Self-pay

## 2021-06-13 ENCOUNTER — Other Ambulatory Visit: Payer: Medicare Other

## 2021-06-13 ENCOUNTER — Inpatient Hospital Stay (HOSPITAL_BASED_OUTPATIENT_CLINIC_OR_DEPARTMENT_OTHER)
Admission: EM | Admit: 2021-06-13 | Discharge: 2021-06-19 | DRG: 193 | Disposition: A | Discharge status: Home / Self Care | Payer: Medicare Other | Attending: Internal Medicine | Admitting: Internal Medicine

## 2021-06-13 ENCOUNTER — Emergency Department (HOSPITAL_BASED_OUTPATIENT_CLINIC_OR_DEPARTMENT_OTHER): Payer: Medicare Other

## 2021-06-13 ENCOUNTER — Observation Stay (HOSPITAL_COMMUNITY): Payer: Medicare Other

## 2021-06-13 ENCOUNTER — Encounter (HOSPITAL_BASED_OUTPATIENT_CLINIC_OR_DEPARTMENT_OTHER): Payer: Self-pay | Admitting: Emergency Medicine

## 2021-06-13 DIAGNOSIS — Z86008 Personal history of in-situ neoplasm of other site: Secondary | ICD-10-CM

## 2021-06-13 DIAGNOSIS — E876 Hypokalemia: Secondary | ICD-10-CM | POA: Diagnosis not present

## 2021-06-13 DIAGNOSIS — D709 Neutropenia, unspecified: Secondary | ICD-10-CM

## 2021-06-13 DIAGNOSIS — I6523 Occlusion and stenosis of bilateral carotid arteries: Secondary | ICD-10-CM | POA: Diagnosis present

## 2021-06-13 DIAGNOSIS — J432 Centrilobular emphysema: Secondary | ICD-10-CM | POA: Diagnosis present

## 2021-06-13 DIAGNOSIS — K219 Gastro-esophageal reflux disease without esophagitis: Secondary | ICD-10-CM | POA: Diagnosis present

## 2021-06-13 DIAGNOSIS — R0602 Shortness of breath: Secondary | ICD-10-CM

## 2021-06-13 DIAGNOSIS — Z923 Personal history of irradiation: Secondary | ICD-10-CM

## 2021-06-13 DIAGNOSIS — R197 Diarrhea, unspecified: Secondary | ICD-10-CM | POA: Diagnosis present

## 2021-06-13 DIAGNOSIS — J189 Pneumonia, unspecified organism: Secondary | ICD-10-CM | POA: Diagnosis not present

## 2021-06-13 DIAGNOSIS — N400 Enlarged prostate without lower urinary tract symptoms: Secondary | ICD-10-CM | POA: Diagnosis present

## 2021-06-13 DIAGNOSIS — Z20822 Contact with and (suspected) exposure to covid-19: Secondary | ICD-10-CM | POA: Diagnosis present

## 2021-06-13 DIAGNOSIS — R1310 Dysphagia, unspecified: Secondary | ICD-10-CM | POA: Diagnosis present

## 2021-06-13 DIAGNOSIS — Z85850 Personal history of malignant neoplasm of thyroid: Secondary | ICD-10-CM

## 2021-06-13 DIAGNOSIS — C9200 Acute myeloblastic leukemia, not having achieved remission: Secondary | ICD-10-CM

## 2021-06-13 DIAGNOSIS — Z95828 Presence of other vascular implants and grafts: Secondary | ICD-10-CM

## 2021-06-13 DIAGNOSIS — Z7901 Long term (current) use of anticoagulants: Secondary | ICD-10-CM

## 2021-06-13 DIAGNOSIS — C9201 Acute myeloblastic leukemia, in remission: Secondary | ICD-10-CM | POA: Diagnosis present

## 2021-06-13 DIAGNOSIS — D61810 Antineoplastic chemotherapy induced pancytopenia: Secondary | ICD-10-CM | POA: Diagnosis present

## 2021-06-13 DIAGNOSIS — Z79899 Other long term (current) drug therapy: Secondary | ICD-10-CM

## 2021-06-13 DIAGNOSIS — Z95820 Peripheral vascular angioplasty status with implants and grafts: Secondary | ICD-10-CM

## 2021-06-13 DIAGNOSIS — C9501 Acute leukemia of unspecified cell type, in remission: Secondary | ICD-10-CM

## 2021-06-13 DIAGNOSIS — E890 Postprocedural hypothyroidism: Secondary | ICD-10-CM | POA: Diagnosis present

## 2021-06-13 DIAGNOSIS — E785 Hyperlipidemia, unspecified: Secondary | ICD-10-CM | POA: Diagnosis present

## 2021-06-13 DIAGNOSIS — Z79890 Hormone replacement therapy (postmenopausal): Secondary | ICD-10-CM

## 2021-06-13 DIAGNOSIS — T451X5A Adverse effect of antineoplastic and immunosuppressive drugs, initial encounter: Secondary | ICD-10-CM | POA: Diagnosis present

## 2021-06-13 DIAGNOSIS — Z86718 Personal history of other venous thrombosis and embolism: Secondary | ICD-10-CM

## 2021-06-13 DIAGNOSIS — I70212 Atherosclerosis of native arteries of extremities with intermittent claudication, left leg: Secondary | ICD-10-CM | POA: Diagnosis present

## 2021-06-13 DIAGNOSIS — Z87891 Personal history of nicotine dependence: Secondary | ICD-10-CM

## 2021-06-13 DIAGNOSIS — R0902 Hypoxemia: Secondary | ICD-10-CM

## 2021-06-13 DIAGNOSIS — Z86711 Personal history of pulmonary embolism: Secondary | ICD-10-CM

## 2021-06-13 DIAGNOSIS — I10 Essential (primary) hypertension: Secondary | ICD-10-CM | POA: Diagnosis present

## 2021-06-13 DIAGNOSIS — Z800 Family history of malignant neoplasm of digestive organs: Secondary | ICD-10-CM

## 2021-06-13 DIAGNOSIS — R251 Tremor, unspecified: Secondary | ICD-10-CM | POA: Diagnosis present

## 2021-06-13 DIAGNOSIS — J9601 Acute respiratory failure with hypoxia: Secondary | ICD-10-CM | POA: Diagnosis present

## 2021-06-13 DIAGNOSIS — R54 Age-related physical debility: Secondary | ICD-10-CM | POA: Diagnosis present

## 2021-06-13 LAB — COMPREHENSIVE METABOLIC PANEL
ALT: 12 U/L (ref 0–44)
AST: 13 U/L — ABNORMAL LOW (ref 15–41)
Albumin: 2.8 g/dL — ABNORMAL LOW (ref 3.5–5.0)
Alkaline Phosphatase: 57 U/L (ref 38–126)
Anion gap: 9 (ref 5–15)
BUN: 11 mg/dL (ref 8–23)
CO2: 24 mmol/L (ref 22–32)
Calcium: 7.9 mg/dL — ABNORMAL LOW (ref 8.9–10.3)
Chloride: 101 mmol/L (ref 98–111)
Creatinine, Ser: 0.86 mg/dL (ref 0.61–1.24)
GFR, Estimated: >60 mL/min (ref >60)
Glucose, Bld: 132 mg/dL — ABNORMAL HIGH (ref 70–99)
Potassium: 3.8 mmol/L (ref 3.5–5.1)
Sodium: 134 mmol/L — ABNORMAL LOW (ref 135–145)
Total Bilirubin: 0.8 mg/dL (ref 0.3–1.2)
Total Protein: 5.5 g/dL — ABNORMAL LOW (ref 6.5–8.1)

## 2021-06-13 LAB — CBC WITH DIFFERENTIAL/PLATELET
Abs Immature Granulocytes: 0.02 10*3/uL (ref 0.00–0.07)
Basophils Absolute: 0 10*3/uL (ref 0.0–0.1)
Basophils Relative: 0 %
Eosinophils Absolute: 0 10*3/uL (ref 0.0–0.5)
Eosinophils Relative: 0 %
HCT: 30.8 % — ABNORMAL LOW (ref 39.0–52.0)
Hemoglobin: 10.1 g/dL — ABNORMAL LOW (ref 13.0–17.0)
Immature Granulocytes: 2 %
Lymphocytes Relative: 31 %
Lymphs Abs: 0.4 10*3/uL — ABNORMAL LOW (ref 0.7–4.0)
MCH: 30.1 pg (ref 26.0–34.0)
MCHC: 32.8 g/dL (ref 30.0–36.0)
MCV: 91.9 fL (ref 80.0–100.0)
Monocytes Absolute: 0.1 10*3/uL (ref 0.1–1.0)
Monocytes Relative: 4 %
Neutro Abs: 0.7 10*3/uL — ABNORMAL LOW (ref 1.7–7.7)
Neutrophils Relative %: 63 %
Platelets: 33 10*3/uL — ABNORMAL LOW (ref 150–400)
RBC: 3.35 MIL/uL — ABNORMAL LOW (ref 4.22–5.81)
RDW: 17 % — ABNORMAL HIGH (ref 11.5–15.5)
WBC: 1.2 10*3/uL — CL (ref 4.0–10.5)
nRBC: 0 % (ref 0.0–0.2)

## 2021-06-13 LAB — RESP PANEL BY RT-PCR (FLU A&B, COVID) ARPGX2
Influenza A by PCR: NEGATIVE
Influenza B by PCR: NEGATIVE
SARS Coronavirus 2 by RT PCR: NEGATIVE

## 2021-06-13 LAB — BRAIN NATRIURETIC PEPTIDE: B Natriuretic Peptide: 60.8 pg/mL (ref 0.0–100.0)

## 2021-06-13 LAB — TROPONIN I (HIGH SENSITIVITY)
Troponin I (High Sensitivity): 9 ng/L (ref <18)
Troponin I (High Sensitivity): 9 ng/L (ref <18)

## 2021-06-13 MED ORDER — METOPROLOL TARTRATE 5 MG/5ML IV SOLN
5.0000 mg | Freq: Four times a day (QID) | INTRAVENOUS | Status: DC | PRN
  Administered 2021-06-13: 5 mg via INTRAVENOUS
  Filled 2021-06-13: qty 5

## 2021-06-13 MED ORDER — GUAIFENESIN ER 600 MG PO TB12
600.0000 mg | ORAL_TABLET | Freq: Two times a day (BID) | ORAL | Status: DC
  Administered 2021-06-13 – 2021-06-14 (×3): 600 mg via ORAL
  Filled 2021-06-13 (×3): qty 1

## 2021-06-13 MED ORDER — SODIUM CHLORIDE 0.9% FLUSH
10.0000 mL | INTRAVENOUS | Status: DC | PRN
  Administered 2021-06-19: 10 mL

## 2021-06-13 MED ORDER — SODIUM CHLORIDE 0.9 % IV SOLN
2.0000 g | Freq: Three times a day (TID) | INTRAVENOUS | Status: DC
  Administered 2021-06-13 – 2021-06-17 (×11): 2 g via INTRAVENOUS
  Filled 2021-06-13 (×12): qty 2

## 2021-06-13 MED ORDER — APIXABAN 2.5 MG PO TABS
2.5000 mg | ORAL_TABLET | Freq: Two times a day (BID) | ORAL | Status: DC
  Administered 2021-06-13 – 2021-06-19 (×12): 2.5 mg via ORAL
  Filled 2021-06-13 (×12): qty 1

## 2021-06-13 MED ORDER — CHLORHEXIDINE GLUCONATE CLOTH 2 % EX PADS
6.0000 | MEDICATED_PAD | Freq: Every day | CUTANEOUS | Status: DC
  Administered 2021-06-13 – 2021-06-18 (×6): 6 via TOPICAL

## 2021-06-13 MED ORDER — SODIUM CHLORIDE 0.9 % IV SOLN: 500.0000 mg | Freq: Once | INTRAVENOUS | Status: DC

## 2021-06-13 MED ORDER — SODIUM CHLORIDE 0.9 % IV SOLN
2.0000 g | Freq: Once | INTRAVENOUS | Status: AC
  Administered 2021-06-13: 2 g via INTRAVENOUS
  Filled 2021-06-13: qty 2

## 2021-06-13 MED ORDER — IOHEXOL 350 MG/ML SOLN
80.0000 mL | Freq: Once | INTRAVENOUS | Status: AC | PRN
  Administered 2021-06-13: 80 mL via INTRAVENOUS

## 2021-06-13 MED ORDER — SODIUM CHLORIDE 0.9 % IV SOLN
1.0000 g | Freq: Once | INTRAVENOUS | Status: DC
  Administered 2021-06-13: 1 g via INTRAVENOUS
  Filled 2021-06-13: qty 10

## 2021-06-13 MED ORDER — SALINE SPRAY 0.65 % NA SOLN: 1.0000 | NASAL | Status: DC | PRN

## 2021-06-13 MED ORDER — ALBUTEROL SULFATE (2.5 MG/3ML) 0.083% IN NEBU: 2.5000 mg | INHALATION_SOLUTION | RESPIRATORY_TRACT | Status: DC | PRN

## 2021-06-13 NOTE — H&P (Signed)
History and Physical    Victor [name] VZC:[id number] DOB: 11-04-1936 DOA: 06/13/2021  PCP: [name], MD  Patient coming from: Home  Chief Complaint: dyspnea  HPI: Victor [name] is a 85 y.o. male with medical history significant of AML, hypothyroidism, PE/DVT on eliquis. Presenting with dyspnea. He reports that he's had DOE for about a week. 7 or so days ago, he was seen by his PCP. He described having some difficulty swallowing and sinus congestion. He was prescribed augmentin. After he started that medication, he felt felt increasingly short of breath. He reports productive cough. He did not try any other medicines. His symptoms worsened through today, so he came to the ED. He denies any other aggravating or alleviating factors.   ED Course: CXR was suggestive of atypical infection. He was found to be leukopenic. He was started on cefepime. [facility] was called for admission.   Review of Systems:  Denies CP, dizziness, lightheadedness, N/V/D, fever. Review of systems is otherwise negative for all not mentioned in HPI.   PMHx Past Medical History:  Diagnosis Date   Arthritis    Atherosclerosis    Atherosclerotic PVD with intermittent claudication (HCC)    stent left leg   BPH (benign prostatic hyperplasia)    Bulging lumbar disc    Cancer ([place]) 11/12/14   vocal cord  carcinoma in situ , radiation; thyroid cancer   Carotid bruit    COPD (chronic obstructive pulmonary disease) (HCC)    DDD (degenerative disc disease), lumbar    Dysphonia    Dysplasia of true vocal cord    GERD (gastroesophageal reflux disease)    H/O carotid atherosclerosis    b/l   Hyperlipidemia    Hypothyroidism    Occasional tremors    left hand managed with propranolol   Peripheral vascular angioplasty status with implants and grafts    Precancerous lesion 03/05/2018   premelanoma removed from back.    Radiation 03/10/15- 04/18/16   Larynx   Sleep apnea    hasn't used CPAP in years   Spondylosis of  lumbosacral region    Thyroid disease     PSHx Past Surgical History:  Procedure Laterality Date   APPENDECTOMY     COLONOSCOPY W/ POLYPECTOMY     DIODE LASER APPLICATION Left 12/87/8676   Procedure: DIODE LASER APPLICATION;  Surgeon: Hayden Pedro, MD;  Location: [facility];  Service: Ophthalmology;  Laterality: Left;   IR IMAGING GUIDED PORT INSERTION  01/16/2021   IR IVC FILTER PLMT / S&I /IMG GUID/MOD SED  03/17/2021   MEMBRANE PEEL Left 08/18/2019   Procedure: MEMBRANE PEEL;  Surgeon: Hayden Pedro, MD;  Location: [place];  Service: Ophthalmology;  Laterality: Left;   MOUTH SURGERY     tooth extraction   PARS PLANA VITRECTOMY Left 08/18/2019   PARS PLANA VITRECTOMY 27 GAUGE (Left)   PARS PLANA VITRECTOMY 27 GAUGE Left 08/18/2019   Procedure: PARS PLANA VITRECTOMY 27 GAUGE;  Surgeon: Hayden Pedro, MD;  Location: [place] [facility];  Service: Ophthalmology;  Laterality: Left;   PHOTOCOAGULATION WITH LASER Left 08/18/2019   Procedure: PHOTOCOAGULATION WITH LASER;  Surgeon: Hayden Pedro, MD;  Location: [place] [facility];  Service: Ophthalmology;  Laterality: Left;   THYROID SURGERY     TONSILLECTOMY     vocal cord biopsy  11/12/14   squamous cell carcinoma in situ    SocHx  reports that he quit smoking about 17 years ago. His smoking use included cigarettes. He started smoking  about 68 years ago. He has a 102.00 pack-year smoking history. He has never used smokeless tobacco. He reports current alcohol use of about 3.0 standard drinks of alcohol per week. He reports that he does not use drugs.  No Known Allergies  FamHx Family History  Problem Relation Age of Onset   Colon cancer Father    Cancer Sister        Died of metastatic cancer   Leukemia Neg Hx     Prior to Admission medications   Medication Sig Start Date End Date Taking? Authorizing Provider  acetaminophen (TYLENOL) 500 MG tablet Take 1,000 mg by mouth every 6 (six) hours as needed for moderate pain. 08/23/20   [provider]  acyclovir (ZOVIRAX) 400 MG tablet Take 1 tablet (400 mg total) by mouth 2 (two) times daily as needed. 11/24/20   Owens Shark, NP  amoxicillin-clavulanate (AUGMENTIN) 875-125 MG tablet Take 1 tablet by mouth 2 (two) times daily for 10 days. 06/06/21 06/16/21  [name], MD  apixaban (ELIQUIS) 2.5 MG TABS tablet Take 1 tablet (2.5 mg total) by mouth 2 (two) times daily. 05/09/21   Volanda Napoleon, MD  doxazosin (CARDURA) 2 MG tablet Take 1 tablet (2 mg total) by mouth at bedtime. 02/26/20   Cirigliano, Mary K, DO  fluticasone (FLONASE) 50 MCG/ACT nasal spray Place 2 sprays into the nose 2 (two) times daily as needed for allergies.    [provider]  hydrALAZINE (APRESOLINE) 25 MG tablet Take 1 tablet (25 mg total) by mouth 3 (three) times daily as needed. Take if standing BP >140/80 mm Hg 12/05/20 03/05/21  [name], MD  HYDROcodone-acetaminophen (NORCO/VICODIN) 5-325 MG tablet Take 1 tablet by mouth every 6 (six) hours as needed for moderate pain.    [provider]  levothyroxine (SYNTHROID) 150 MCG tablet Take 1 tablet (150 mcg total) by mouth daily. 06/08/21   [name], MD  Lidocaine 4 % PTCH Apply topically. Uses 1/2 patch daily.    [provider]  lidocaine-prilocaine (EMLA) cream Apply 1 application topically as needed for up to 30 doses. 01/23/21   Volanda Napoleon, MD  Loratadine (CLARITIN PO) Take 10 mg by mouth at bedtime. Allertec    [provider]  Melatonin 5 MG CHEW Chew 1 tablet by mouth daily as needed (sleep).    [provider]  omeprazole (PRILOSEC) 20 MG capsule TAKE 1 CAPSULE BY MOUTH TWICE A DAY BEFORE A MEAL 03/23/21   Cirigliano, Mary K, DO  potassium chloride (MICRO-K) 10 MEQ CR capsule Take 10 mEq by mouth in the morning and at bedtime. 09/27/20 09/27/21  [provider]  propranolol ER (INDERAL LA) 80 MG 24 hr capsule TAKE ONE CAPSULE BY MOUTH ONE TIME DAILY 05/04/21   Cirigliano, Mary K, DO   simvastatin (ZOCOR) 40 MG tablet TAKE ONE TABLET BY MOUTH DAILY AT BEDTIME Patient taking differently: Take 40 mg by mouth daily at 6 PM. 02/23/21   Cirigliano, Mary K, DO  venetoclax 100 MG TABS Take 100 mg by mouth in the morning, at noon, in the evening, and at bedtime. 08/15/20   [provider]    Physical Exam: Vitals:   06/13/21 1428 06/13/21 1430 06/13/21 1500 06/13/21 1636  BP:  (!) 147/54 (!) 161/57 (!) 163/67  Pulse: 100 (!) 102 98 (!) 101  Resp: (!) 22 (!) 25 (!) 22 (!) 24  Temp:    98.2 F (36.8 C)  TempSrc:  Oral  SpO2: 93% 92% 95% 100%    General: 85 y.o. male resting in bed in NAD Eyes: PERRL, normal sclera ENMT: Nares patent w/o discharge, orophaynx clear, dentition normal, ears w/o discharge/lesions/ulcers Neck: Supple, trachea midline Cardiovascular: RRR, +S1, S2, no m/g/r, equal pulses throughout Respiratory: somewhat decreased at bases, no w/r/r, normal WOB on 3L [place] GI: BS+, NDNT, no masses noted, no organomegaly noted MSK: No e/c/c Skin: No rashes, bruises, ulcerations noted Neuro: A&O x 3, no focal deficits Psyc: Appropriate interaction and affect, calm/cooperative  Labs on Admission: I have personally reviewed following labs and imaging studies  CBC: Recent Labs  Lab 06/12/21 0945 06/13/21 1109  WBC 1.6* 1.2*  NEUTROABS 1.1* 0.7*  HGB 10.4* 10.1*  HCT 32.1* 30.8*  MCV 92.5 91.9  PLT 32* 33*   Basic Metabolic Panel: Recent Labs  Lab 06/12/21 0945 06/13/21 1109  NA 133* 134*  K 3.9 3.8  CL 98 101  CO2 26 24  GLUCOSE 133* 132*  BUN 11 11  CREATININE 1.00 0.86  CALCIUM 8.5* 7.9*  MG 1.9  --    GFR: Estimated Creatinine Clearance: 75.6 mL/min (by C-G formula based on SCr of 0.86 mg/dL). Liver Function Tests: Recent Labs  Lab 06/12/21 0945 06/13/21 1109  AST 9* 13*  ALT 11 12  ALKPHOS 62 57  BILITOT 1.2 0.8  PROT 5.5* 5.5*  ALBUMIN 3.3* 2.8*   No results for input(s): LIPASE, AMYLASE in the last 168 hours. No  results for input(s): AMMONIA in the last 168 hours. Coagulation Profile: No results for input(s): INR, PROTIME in the last 168 hours. Cardiac Enzymes: No results for input(s): CKTOTAL, CKMB, CKMBINDEX, TROPONINI in the last 168 hours. BNP (last 3 results) No results for input(s): PROBNP in the last 8760 hours. HbA1C: No results for input(s): HGBA1C in the last 72 hours. CBG: No results for input(s): GLUCAP in the last 168 hours. Lipid Profile: No results for input(s): CHOL, HDL, LDLCALC, TRIG, CHOLHDL, LDLDIRECT in the last 72 hours. Thyroid Function Tests: No results for input(s): TSH, T4TOTAL, FREET4, T3FREE, THYROIDAB in the last 72 hours. Anemia Panel: No results for input(s): VITAMINB12, FOLATE, FERRITIN, TIBC, IRON, RETICCTPCT in the last 72 hours. Urine analysis:    Component Value Date/Time   COLORURINE YELLOW 06/06/2021 1414   APPEARANCEUR CLEAR 06/06/2021 1414   LABSPEC 1.020 06/06/2021 1414   PHURINE 6.0 06/06/2021 1414   GLUCOSEU NEGATIVE 06/06/2021 1414   HGBUR SMALL (A) 06/06/2021 1414   BILIRUBINUR NEGATIVE 06/06/2021 1414   KETONESUR NEGATIVE 06/06/2021 1414   PROTEINUR NEGATIVE 03/15/2021 1149   UROBILINOGEN 0.2 06/06/2021 1414   NITRITE NEGATIVE 06/06/2021 1414   LEUKOCYTESUR NEGATIVE 06/06/2021 1414    Radiological Exams on Admission: DG Chest Port 1 View  Result Date: 06/13/2021 CLINICAL DATA:  85 year old male with shortness of breath for 5-6 days after starting new medication. EXAM: PORTABLE CHEST 1 VIEW COMPARISON:  Chest CTA 04/17/2021 and earlier. FINDINGS: Portable AP semi upright view at 1048 hours. Stable right chest dual lumen power port. Lung volumes and mediastinal contours are stable and within normal limits. Chronic but increased bilateral, basilar predominant pulmonary interstitial opacitysince May. No confluent opacity. No pneumothorax. No pleural effusion. No acute osseous abnormality identified. IMPRESSION: Chronic but increased bilateral  pulmonary interstitial opacity. Differential considerations include mild/developing interstitial edema versus viral/atypical respiratory infection. Electronically Signed   By: Genevie Ann M.D.   On: 06/13/2021 11:44    Assessment/Plan Acute hypoxic respiratory failure ?multifocal pna     -  admitted to obs, tele     - continue cefepime, check MRSA swab     - check urine legionella, strep; check RVP     - COVID/flu negative     - PRN nebs     - nasal saline     - guaifenesin     - wean O2 as able     - CTA chest  Hx of AML     - onco notified of admission, will see in AM  Pancytopenia     - defer to onco  Hx of DVT/PE     - continue eliquis  BPH     - continue home regimen when confirmed  HLD     - continue home statin when confirmed  Hypothyroidism     - continue home synthroid when confirmed   DVT prophylaxis: eliquis  Code Status: FULL  Family Communication: None at bedside  Consults called: ONco   Status is: Observation  The patient remains OBS appropriate and will d/c before 2 midnights.  Dispo: The patient is from: Home              Anticipated d/c is to: Home              Patient currently is not medically stable to d/c.   Difficult to place patient No  Time spent coordinating admission: 60 minutes  [facility]  If 7PM-7AM, please contact night-coverage [email or web address]  06/13/2021, 4:43 PM

## 2021-06-13 NOTE — ED Notes (Signed)
Pt O2 at 92% and pulse 115 while ambulating.

## 2021-06-13 NOTE — ED Notes (Signed)
Report to  [place]

## 2021-06-13 NOTE — ED Provider Notes (Signed)
[facility] Provider Note   CSN: [id number] Arrival date & time: 06/13/21  1028     History Chief Complaint  Patient presents with   Shortness of Breath    Victor [name] is a 85 y.o. male with PMHx AML, HLD, Hypothyroidism, COPD, GERD who presents to the ED today with complaint of gradual onset, constant, worsening, dyspnea on exertion for the past 5-6 days.  Wife reports that about 1 week ago patient began having a dry cough.  Did they were prescribed Augmentin by patient's physician.  He states that since that time he has felt short of breath and feels like it was related to the Augmentin.  He stopped taking it yesterday and states that he feels more short of breath prompting ED visit today.  He does mention he saw Dr. [name] his oncologist yesterday and mentioned feeling short of breath.  He was advised to follow-up with the physician who prescribed him the Augmentin.  Wife reports that they did not test him for COVID nor did they do a chest x-ray.  He was having some sinus pressure at that time and so they prescribed the Augmentin for sinus infection.  Wife denies any fevers at home.  Patient is currently on Eliquis for PE and states he has not missed any doses.  He denies any chest pain.  He does report some diarrhea that he attributes to the Augmentin as well.  Does report his cough seems to have improved however.   The history is provided by the spouse, the patient and medical records.      Past Medical History:  Diagnosis Date   Arthritis    Atherosclerosis    Atherosclerotic PVD with intermittent claudication (HCC)    stent left leg   BPH (benign prostatic hyperplasia)    Bulging lumbar disc    Cancer ([place]) 11/12/14   vocal cord  carcinoma in situ , radiation; thyroid cancer   Carotid bruit    COPD (chronic obstructive pulmonary disease) (HCC)    DDD (degenerative disc disease), lumbar    Dysphonia    Dysplasia of true vocal cord    GERD  (gastroesophageal reflux disease)    H/O carotid atherosclerosis    b/l   Hyperlipidemia    Hypothyroidism    Occasional tremors    left hand managed with propranolol   Peripheral vascular angioplasty status with implants and grafts    Precancerous lesion 03/05/2018   premelanoma removed from back.    Radiation 03/10/15- 04/18/16   Larynx   Sleep apnea    hasn't used CPAP in years   Spondylosis of lumbosacral region    Thyroid disease     Patient Active Problem List   Diagnosis Date Noted   Sinusitis 06/06/2021   Dehydration 06/06/2021   Facet arthritis of lumbosacral region 05/04/2021   Ulnar neuropathy of both upper extremities 05/04/2021   Neuropathy of right medial plantar nerve 05/01/2021   Port-A-Cath in place 04/27/2021   Right leg DVT ([place]) 03/16/2021   Pulmonary embolism ([place]) 03/15/2021   Closed fracture of one rib of right side 10/04/2020   Goals of care, counseling/discussion 09/05/2020   PICC (peripherally inserted central catheter) in place 08/22/2020   Tumor lysis syndrome 08/06/2020   Thrombocytopenia ([place]) 08/05/2020   Anemia 08/05/2020   AKI (acute kidney injury) ([place]) 08/05/2020   Hematologic malignancy ([place]) 08/05/2020   Acute leukemia ([place]) 08/05/2020   Hypothyroidism 08/05/2020   Malaise and fatigue 08/04/2020  Obesity (BMI 30-39.9) 08/04/2020   COPD, moderate ([place]) 05/24/2020   Bilateral hip pain 05/04/2020   Plantar fasciitis of left foot 03/30/2020   Chronic left SI joint pain 03/01/2020   Shortness of breath 10/01/2019   Former smoker 10/01/2019   Healthcare maintenance 10/01/2019   Epiretinal membrane (ERM) of left eye 07/22/2019   Centrilobular emphysema ([place]) 07/02/2019   Pharyngeal dysphagia 04/07/2019   Cervical radiculopathy 03/16/2019   History of glottic cancer 09/08/2018   Lumbar radiculopathy 04/16/2017   Rotator cuff syndrome of left shoulder 04/16/2017   Trochanteric bursitis, left hip 04/16/2017   Atherosclerosis of artery of  both lower extremities ([place]) 11/14/2016   Bilateral carotid artery stenosis 11/14/2016   Vitamin D deficiency 07/31/2016   Facet arthritis of lumbar region 09/26/2015   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   History of thyroid cancer 03/26/2014   Benign prostatic hyperplasia with urinary obstruction 03/23/2014   Dysphonia 03/17/2014   Gastro-esophageal reflux disease without esophagitis 03/17/2014   Elevated cholesterol 03/17/2014   Other intervertebral disc degeneration, lumbar region 03/17/2014   PAD (peripheral artery disease) ([place]) 03/17/2014   Postoperative hypothyroidism 03/17/2014   Chronic kidney disease, stage 2 (mild) 03/17/2014   Occasional tremors 03/17/2014    Past Surgical History:  Procedure Laterality Date   APPENDECTOMY     COLONOSCOPY W/ POLYPECTOMY     DIODE LASER APPLICATION Left 90/24/0973   Procedure: DIODE LASER APPLICATION;  Surgeon: Hayden Pedro, MD;  Location: [place] [facility];  Service: Ophthalmology;  Laterality: Left;   IR IMAGING GUIDED PORT INSERTION  01/16/2021   IR IVC FILTER PLMT / S&I /IMG GUID/MOD SED  03/17/2021   MEMBRANE PEEL Left 08/18/2019   Procedure: MEMBRANE PEEL;  Surgeon: Hayden Pedro, MD;  Location: [facility];  Service: Ophthalmology;  Laterality: Left;   MOUTH SURGERY     tooth extraction   PARS PLANA VITRECTOMY Left 08/18/2019   PARS PLANA VITRECTOMY 27 GAUGE (Left)   PARS PLANA VITRECTOMY 27 GAUGE Left 08/18/2019   Procedure: PARS PLANA VITRECTOMY 27 GAUGE;  Surgeon: Hayden Pedro, MD;  Location: [facility];  Service: Ophthalmology;  Laterality: Left;   PHOTOCOAGULATION WITH LASER Left 08/18/2019   Procedure: PHOTOCOAGULATION WITH LASER;  Surgeon: Hayden Pedro, MD;  Location: [facility];  Service: Ophthalmology;  Laterality: Left;   THYROID SURGERY     TONSILLECTOMY     vocal cord biopsy  11/12/14   squamous cell carcinoma in situ       Family History  Problem Relation Age of Onset   Colon cancer Father    Cancer Sister         Died of metastatic cancer   Leukemia Neg Hx     Social History   Tobacco Use   Smoking status: Former    Packs/day: 2.00    Years: 51.00    Pack years: 102.00    Types: Cigarettes    Start date: 76    Quit date: 03/05/2004    Years since quitting: 17.2   Smokeless tobacco: Never  Vaping Use   Vaping Use: Never used  Substance Use Topics   Alcohol use: Yes    Alcohol/week: 3.0 standard drinks    Types: 3 Glasses of wine per week    Comment: occ   Drug use: No    Home Medications Prior to Admission medications   Medication Sig Start Date End Date Taking? Authorizing Provider  acetaminophen (TYLENOL) 500 MG tablet Take 1,000 mg by  mouth every 6 (six) hours as needed for moderate pain. 08/23/20   [provider]  acyclovir (ZOVIRAX) 400 MG tablet Take 1 tablet (400 mg total) by mouth 2 (two) times daily as needed. 11/24/20   Owens Shark, NP  amoxicillin-clavulanate (AUGMENTIN) 875-125 MG tablet Take 1 tablet by mouth 2 (two) times daily for 10 days. 06/06/21 06/16/21  [name], MD  apixaban (ELIQUIS) 2.5 MG TABS tablet Take 1 tablet (2.5 mg total) by mouth 2 (two) times daily. 05/09/21   Volanda Napoleon, MD  doxazosin (CARDURA) 2 MG tablet Take 1 tablet (2 mg total) by mouth at bedtime. 02/26/20   Cirigliano, Garvin Fila, DO  fluconazole (DIFLUCAN) 200 MG tablet Take 200 mg by mouth daily. Patient not taking: No sig reported    [provider]  fluticasone (FLONASE) 50 MCG/ACT nasal spray Place 2 sprays into the nose 2 (two) times daily as needed for allergies.    [provider]  hydrALAZINE (APRESOLINE) 25 MG tablet Take 1 tablet (25 mg total) by mouth 3 (three) times daily as needed. Take if standing BP >140/80 mm Hg 12/05/20 03/05/21  [name], MD  HYDROcodone-acetaminophen (NORCO/VICODIN) 5-325 MG tablet Take 1 tablet by mouth every 6 (six) hours as needed for moderate pain.    [provider]  levofloxacin (LEVAQUIN) 500 MG tablet Take  500 mg by mouth daily. Patient not taking: No sig reported    [provider]  levothyroxine (SYNTHROID) 150 MCG tablet Take 1 tablet (150 mcg total) by mouth daily. 06/08/21   [name], MD  Lidocaine 4 % PTCH Apply topically. Uses 1/2 patch daily.    [provider]  lidocaine-prilocaine (EMLA) cream Apply 1 application topically as needed for up to 30 doses. 01/23/21   Volanda Napoleon, MD  Loratadine (CLARITIN PO) Take 10 mg by mouth at bedtime. Allertec    [provider]  Melatonin 5 MG CHEW Chew 1 tablet by mouth daily as needed (sleep).    [provider]  omeprazole (PRILOSEC) 20 MG capsule TAKE 1 CAPSULE BY MOUTH TWICE A DAY BEFORE A MEAL 03/23/21   Cirigliano, Mary K, DO  potassium chloride (MICRO-K) 10 MEQ CR capsule Take 10 mEq by mouth in the morning and at bedtime. 09/27/20 09/27/21  [provider]  propranolol ER (INDERAL LA) 80 MG 24 hr capsule TAKE ONE CAPSULE BY MOUTH ONE TIME DAILY 05/04/21   Cirigliano, Mary K, DO  simvastatin (ZOCOR) 40 MG tablet TAKE ONE TABLET BY MOUTH DAILY AT BEDTIME Patient taking differently: Take 40 mg by mouth daily at 6 PM. 02/23/21   Cirigliano, Mary K, DO  venetoclax 100 MG TABS Take 100 mg by mouth in the morning, at noon, in the evening, and at bedtime. 08/15/20   [provider]    Allergies    Patient has no known allergies.  Review of Systems   Review of Systems  Constitutional:  Negative for fever.  Respiratory:  Positive for cough and shortness of breath.   Cardiovascular:  Negative for chest pain.  Gastrointestinal:  Positive for diarrhea. Negative for abdominal pain, nausea and vomiting.  Musculoskeletal:  Negative for myalgias.  All other systems reviewed and are negative.  Physical Exam Updated Vital Signs BP 134/69   Pulse 95   Temp 98.6 F (37 C) (Oral)   Resp (!) 25   SpO2 96%   Physical Exam Vitals and nursing note reviewed.  Constitutional:  Appearance: He is not ill-appearing or diaphoretic.  HENT:     Head: Normocephalic and atraumatic.  Eyes:     Conjunctiva/sclera: Conjunctivae normal.  Cardiovascular:     Rate and Rhythm: Regular rhythm. Tachycardia present.     Pulses: Normal pulses.  Pulmonary:     Effort: Pulmonary effort is normal.     Breath sounds: Decreased breath sounds present. No wheezing, rhonchi or rales.     Comments: Speaking in full sentences without difficulty. Decreased air movement throughout without wheezing. Satting 92-93% on RA at rest.  Abdominal:     Palpations: Abdomen is soft.     Tenderness: There is no abdominal tenderness. There is no guarding or rebound.  Musculoskeletal:     Cervical back: Neck supple.     Right lower leg: No edema.     Left lower leg: No edema.  Skin:    General: Skin is warm and dry.  Neurological:     Mental Status: He is alert.    ED Results / Procedures / Treatments   Labs (all labs ordered are listed, but only abnormal results are displayed) Labs Reviewed  COMPREHENSIVE METABOLIC PANEL - Abnormal; Notable for the following components:      Result Value   Sodium 134 (*)    Glucose, Bld 132 (*)    Calcium 7.9 (*)    Total Protein 5.5 (*)    Albumin 2.8 (*)    AST 13 (*)    All other components within normal limits  CBC WITH DIFFERENTIAL/PLATELET - Abnormal; Notable for the following components:   WBC 1.2 (*)    RBC 3.35 (*)    Hemoglobin 10.1 (*)    HCT 30.8 (*)    RDW 17.0 (*)    Platelets 33 (*)    Neutro Abs 0.7 (*)    Lymphs Abs 0.4 (*)    All other components within normal limits  RESP PANEL BY RT-PCR (FLU A&B, COVID) ARPGX2  BRAIN NATRIURETIC PEPTIDE  TROPONIN I (HIGH SENSITIVITY)  TROPONIN I (HIGH SENSITIVITY)    EKG None  Radiology DG Chest Port 1 View  Result Date: 06/13/2021 CLINICAL DATA:  85 year old male with shortness of breath for 5-6 days after starting new medication. EXAM: PORTABLE CHEST 1 VIEW COMPARISON:  Chest CTA  04/17/2021 and earlier. FINDINGS: Portable AP semi upright view at 1048 hours. Stable right chest dual lumen power port. Lung volumes and mediastinal contours are stable and within normal limits. Chronic but increased bilateral, basilar predominant pulmonary interstitial opacitysince May. No confluent opacity. No pneumothorax. No pleural effusion. No acute osseous abnormality identified. IMPRESSION: Chronic but increased bilateral pulmonary interstitial opacity. Differential considerations include mild/developing interstitial edema versus viral/atypical respiratory infection. Electronically Signed   By: Genevie Ann M.D.   On: 06/13/2021 11:44    Procedures Procedures   Medications Ordered in ED Medications  cefTRIAXone (ROCEPHIN) 1 g in sodium chloride 0.9 % 100 mL IVPB (has no administration in time range)  azithromycin (ZITHROMAX) 500 mg in sodium chloride 0.9 % 250 mL IVPB (has no administration in time range)    ED Course  I have reviewed the triage vital signs and the nursing notes.  Pertinent labs & imaging results that were available during my care of the patient were reviewed by me and considered in my medical decision making (see chart for details).  Clinical Course as of 06/13/21 1337  Tue Jun 13, 2021  1132 SpO2(!): 88 % [MV]    Clinical Course User  Index [MV] Eustaquio Maize, PA-C   MDM Rules/Calculators/A&P                           85 year old male who presents to the ED today with complaint of shortness of breath that began 5 to 6 days ago.  Currently on Augmentin for a sinus infection/cough prescribed about 1 week ago which she attributes to his symptoms.  Was not tested for COVID nor had a chest x-ray.  On arrival to the ED patient is afebrile.  He is tachycardic at 104, nontachypneic.  He is speaking in full sentences without difficulty and no accessory muscle use.  He does have diminished air movement throughout however.  No wheezing.  History of COPD however does not seem  consistent with COPD exacerbation at this time.  He is satting 92 to 93% on room air at rest.  We will plan to ambulate to assess for any desat.  We will plan for EKG, chest x-ray, labs including troponin as well as a COVID/flu swab.   While in the ED pt desatted to 88%; placed on 2L  CXR: IMPRESSION:  Chronic but increased bilateral pulmonary interstitial opacity.  Differential considerations include mild/developing interstitial  edema versus viral/atypical respiratory infection.   CMP with sodium 134, glucose 132. Calcium 7.9. No other electrolyte abnormalities CBC with WBC 1.2 (1.6 yesterday at oncology). Hgb stable at 10.1.   WBC  Date Value Ref Range Status  06/13/2021 1.2 (LL) 4.0 - 10.5 K/uL Final    Comment:    This critical result has verified and been called to [place] by Carmela Hurt on 08 09 2022 at 1235, and has been read back.   03/18/2021 1.2 (LL) 4.0 - 10.5 K/uL Final    Comment:    This critical result has verified and been called to [facility] by Rande Brunt on 05 14 2022 at 0518, and has been read back. CRITICAL RESULT VERIFIED  03/17/2021 1.7 (L) 4.0 - 10.5 K/uL Final  03/16/2021 3.0 (L) 4.0 - 10.5 K/uL Final   WBC Count  Date Value Ref Range Status  06/12/2021 1.6 (L) 4.0 - 10.5 K/uL Final  06/05/2021 5.6 4.0 - 10.5 K/uL Final  05/29/2021 4.7 4.0 - 10.5 K/uL Final  05/22/2021 5.3 4.0 - 10.5 K/uL Final   Troponin 9 COVID and flu negative  Given CXR findings and hypoxia will cover for CAP at this time. Will also add on BNP given interstitial edema. Regardless feel pt requires admission at this time.   Discussed case with [facility] Dr. Marylyn Ishihara who agrees to accept patient for admission.   This note was prepared using Dragon voice recognition software and may include unintentional dictation errors due to the inherent limitations of voice recognition software.   Final Clinical Impression(s) / ED Diagnoses Final diagnoses:  Shortness of  breath  Hypoxia  Community acquired pneumonia, unspecified laterality  Acute myeloid leukemia not having achieved remission ([place])  Neutropenia, unspecified type [facility])    Rx / DC Orders ED Discharge Orders     None        Eustaquio Maize, PA-C 06/13/21 1340    Godfrey Pick, MD 06/15/21 [phone]

## 2021-06-13 NOTE — Telephone Encounter (Signed)
Patient aware spoke with Victor [name]

## 2021-06-13 NOTE — Progress Notes (Signed)
Notified by EDP of need for admission d/t PNA. [facility] accepts patient to tele at [facility]. EDP is to remain responsible for orders/medical decisions while patient is holding at [facility]. Upon arrival to [facility], [facility] will assume care. Nursing staff will call patient placement to notify them of patient's arrival so that the proper [facility] member may receive the patient. Thank you.

## 2021-06-13 NOTE — ED Triage Notes (Signed)
Pt reports SHOB on exertion 5-6 days ago. Pt reports that it started after beginning a new medication. Denies pain.

## 2021-06-13 NOTE — Progress Notes (Signed)
   06/13/21 1636  Assess: MEWS Score  Temp 98.2 F (36.8 C)  BP (!) 163/67  Pulse Rate (!) 101  Resp (!) 24  SpO2 100 %  Assess: MEWS Score  MEWS Temp 0  MEWS Systolic 0  MEWS Pulse 1  MEWS RR 1  MEWS LOC 0  MEWS Score 2  MEWS Score Color Yellow  Assess: if the MEWS score is Yellow or Red  Were vital signs taken at a resting state? Yes  Focused Assessment Change from prior assessment (see assessment flowsheet)  Does the patient meet 2 or more of the SIRS criteria? Yes  Does the patient have a confirmed or suspected source of infection? No  MEWS guidelines implemented *See Row Information* Yes  Treat  MEWS Interventions Escalated (See documentation below)  Pain Score 3  Pain Location Back  Pain Orientation Lower  Pain Descriptors / Indicators Aching;Discomfort  Pain Frequency Constant  Complains of Shortness of breath  Take Vital Signs  Increase Vital Sign Frequency  Yellow: Q 2hr X 2 then Q 4hr X 2, if remains yellow, continue Q 4hrs  Escalate  MEWS: Escalate Yellow: discuss with charge nurse/RN and consider discussing with provider and RRT  Notify: Charge Nurse/RN  Name of Charge Nurse/RN Notified Harvest Dark, RN  Date Charge Nurse/RN Notified 06/13/21  Time Charge Nurse/RN Notified 1640  Notify: Provider  Provider Name/Title Marylyn Ishihara  Date Provider Notified 06/13/21  Time Provider Notified 1641  Notification Type Face-to-face  Notification Reason Change in status  Provider response At bedside  Date of Provider Response 06/13/21  Time of Provider Response 1641  Document  Progress note created (see row info) Yes  Assess: SIRS CRITERIA  SIRS Temperature  0  SIRS Pulse 1  SIRS Respirations  1  SIRS WBC 0  SIRS Score Sum  2    Pt arrived to unit in a yellow mews from [place]. MD notified. Charge nurse notified.

## 2021-06-13 NOTE — Telephone Encounter (Signed)
Patient notified VIA phone and will stop the antibiotic and go to te ER to be checked out for the SOB. Dm/cma

## 2021-06-13 NOTE — ED Notes (Signed)
SpO2 88% on 2lpm, patient in no distress, BBS unchanged, O2 increased to 4lpm [place]. RN aware

## 2021-06-13 NOTE — Progress Notes (Addendum)
Pharmacy Antibiotic Note  Victor [name] is a 85 y.o. male admitted on 06/13/2021 with pneumonia.  Pharmacy has been consulted for cefepime dosing.  CXR suggestive of atypical infection; ANC 0.7 (indicative of neutropenia), patient has been afebrile  Plan: Cefepime 2g IV q8 hours to begin this evening (received 1 dose in ED ~1430) F/u clinical improvement, MRSA PCR     Temp (24hrs), Avg:98.6 F (37 C), Min:98.2 F (36.8 C), Max:98.9 F (37.2 C)  Recent Labs  Lab 06/12/21 0945 06/13/21 1109  WBC 1.6* 1.2*  CREATININE 1.00 0.86    Estimated Creatinine Clearance: 75.6 mL/min (by C-G formula based on SCr of 0.86 mg/dL).    Allergies  Allergen Reactions   Augmentin [Amoxicillin-Pot Clavulanate] Diarrhea    SOB    Antimicrobials this admission: Cefepime 8/9 >>   Dose adjustments this admission: N/A  Microbiology results: None this admit  Thank you for allowing pharmacy to be a part of this patient's care.  Dimple Nanas, PharmD 06/13/2021 6:47 PM

## 2021-06-14 DIAGNOSIS — Z79899 Other long term (current) drug therapy: Secondary | ICD-10-CM | POA: Diagnosis not present

## 2021-06-14 DIAGNOSIS — R1310 Dysphagia, unspecified: Secondary | ICD-10-CM | POA: Diagnosis present

## 2021-06-14 DIAGNOSIS — J189 Pneumonia, unspecified organism: Secondary | ICD-10-CM | POA: Diagnosis present

## 2021-06-14 DIAGNOSIS — C9201 Acute myeloblastic leukemia, in remission: Secondary | ICD-10-CM | POA: Diagnosis present

## 2021-06-14 DIAGNOSIS — Z86008 Personal history of in-situ neoplasm of other site: Secondary | ICD-10-CM | POA: Diagnosis not present

## 2021-06-14 DIAGNOSIS — Z800 Family history of malignant neoplasm of digestive organs: Secondary | ICD-10-CM | POA: Diagnosis not present

## 2021-06-14 DIAGNOSIS — Z20822 Contact with and (suspected) exposure to covid-19: Secondary | ICD-10-CM | POA: Diagnosis present

## 2021-06-14 DIAGNOSIS — R0602 Shortness of breath: Secondary | ICD-10-CM | POA: Diagnosis present

## 2021-06-14 DIAGNOSIS — N400 Enlarged prostate without lower urinary tract symptoms: Secondary | ICD-10-CM | POA: Diagnosis present

## 2021-06-14 DIAGNOSIS — Z86718 Personal history of other venous thrombosis and embolism: Secondary | ICD-10-CM | POA: Diagnosis not present

## 2021-06-14 DIAGNOSIS — Z95820 Peripheral vascular angioplasty status with implants and grafts: Secondary | ICD-10-CM | POA: Diagnosis not present

## 2021-06-14 DIAGNOSIS — E876 Hypokalemia: Secondary | ICD-10-CM | POA: Diagnosis not present

## 2021-06-14 DIAGNOSIS — C9501 Acute leukemia of unspecified cell type, in remission: Secondary | ICD-10-CM | POA: Diagnosis not present

## 2021-06-14 DIAGNOSIS — Z95828 Presence of other vascular implants and grafts: Secondary | ICD-10-CM | POA: Diagnosis not present

## 2021-06-14 DIAGNOSIS — I10 Essential (primary) hypertension: Secondary | ICD-10-CM | POA: Diagnosis present

## 2021-06-14 DIAGNOSIS — J9601 Acute respiratory failure with hypoxia: Secondary | ICD-10-CM | POA: Diagnosis present

## 2021-06-14 DIAGNOSIS — D709 Neutropenia, unspecified: Secondary | ICD-10-CM

## 2021-06-14 DIAGNOSIS — R54 Age-related physical debility: Secondary | ICD-10-CM | POA: Diagnosis present

## 2021-06-14 DIAGNOSIS — Z7901 Long term (current) use of anticoagulants: Secondary | ICD-10-CM | POA: Diagnosis not present

## 2021-06-14 DIAGNOSIS — J432 Centrilobular emphysema: Secondary | ICD-10-CM | POA: Diagnosis present

## 2021-06-14 DIAGNOSIS — Z86711 Personal history of pulmonary embolism: Secondary | ICD-10-CM | POA: Diagnosis not present

## 2021-06-14 DIAGNOSIS — T451X5A Adverse effect of antineoplastic and immunosuppressive drugs, initial encounter: Secondary | ICD-10-CM | POA: Diagnosis present

## 2021-06-14 DIAGNOSIS — I6523 Occlusion and stenosis of bilateral carotid arteries: Secondary | ICD-10-CM | POA: Diagnosis present

## 2021-06-14 DIAGNOSIS — I70212 Atherosclerosis of native arteries of extremities with intermittent claudication, left leg: Secondary | ICD-10-CM | POA: Diagnosis present

## 2021-06-14 DIAGNOSIS — E785 Hyperlipidemia, unspecified: Secondary | ICD-10-CM | POA: Diagnosis present

## 2021-06-14 DIAGNOSIS — E890 Postprocedural hypothyroidism: Secondary | ICD-10-CM | POA: Diagnosis present

## 2021-06-14 DIAGNOSIS — D61810 Antineoplastic chemotherapy induced pancytopenia: Secondary | ICD-10-CM | POA: Diagnosis present

## 2021-06-14 LAB — COMPREHENSIVE METABOLIC PANEL
ALT: 13 U/L (ref 0–44)
AST: 10 U/L — ABNORMAL LOW (ref 15–41)
Albumin: 2.5 g/dL — ABNORMAL LOW (ref 3.5–5.0)
Alkaline Phosphatase: 53 U/L (ref 38–126)
Anion gap: 9 (ref 5–15)
BUN: 10 mg/dL (ref 8–23)
CO2: 25 mmol/L (ref 22–32)
Calcium: 7.9 mg/dL — ABNORMAL LOW (ref 8.9–10.3)
Chloride: 102 mmol/L (ref 98–111)
Creatinine, Ser: 0.91 mg/dL (ref 0.61–1.24)
GFR, Estimated: >60 mL/min (ref >60)
Glucose, Bld: 108 mg/dL — ABNORMAL HIGH (ref 70–99)
Potassium: 3.6 mmol/L (ref 3.5–5.1)
Sodium: 136 mmol/L (ref 135–145)
Total Bilirubin: 1 mg/dL (ref 0.3–1.2)
Total Protein: 5.2 g/dL — ABNORMAL LOW (ref 6.5–8.1)

## 2021-06-14 LAB — RESPIRATORY PANEL BY PCR

## 2021-06-14 LAB — CBC
HCT: 29 % — ABNORMAL LOW (ref 39.0–52.0)
Hemoglobin: 9.2 g/dL — ABNORMAL LOW (ref 13.0–17.0)
MCH: 29.9 pg (ref 26.0–34.0)
MCHC: 31.7 g/dL (ref 30.0–36.0)
MCV: 94.2 fL (ref 80.0–100.0)
Platelets: 29 10*3/uL — CL (ref 150–400)
RBC: 3.08 MIL/uL — ABNORMAL LOW (ref 4.22–5.81)
RDW: 16.9 % — ABNORMAL HIGH (ref 11.5–15.5)
WBC: 0.8 10*3/uL — CL (ref 4.0–10.5)
nRBC: 0 % (ref 0.0–0.2)

## 2021-06-14 MED ORDER — FLUTICASONE PROPIONATE 50 MCG/ACT NA SUSP
2.0000 | Freq: Two times a day (BID) | NASAL | Status: DC | PRN
  Filled 2021-06-14: qty 16

## 2021-06-14 MED ORDER — PANTOPRAZOLE SODIUM 40 MG PO TBEC
40.0000 mg | DELAYED_RELEASE_TABLET | Freq: Every day | ORAL | Status: DC
  Administered 2021-06-14: 40 mg via ORAL
  Filled 2021-06-14: qty 1

## 2021-06-14 MED ORDER — LORATADINE 10 MG PO TABS
10.0000 mg | ORAL_TABLET | Freq: Every day | ORAL | Status: DC
  Administered 2021-06-14 – 2021-06-18 (×5): 10 mg via ORAL
  Filled 2021-06-14 (×5): qty 1

## 2021-06-14 MED ORDER — ACYCLOVIR 400 MG PO TABS
400.0000 mg | ORAL_TABLET | Freq: Two times a day (BID) | ORAL | Status: DC
  Administered 2021-06-14 – 2021-06-18 (×10): 400 mg via ORAL
  Filled 2021-06-14 (×11): qty 1

## 2021-06-14 MED ORDER — DOXYCYCLINE HYCLATE 100 MG PO TABS
100.0000 mg | ORAL_TABLET | Freq: Two times a day (BID) | ORAL | Status: DC
  Administered 2021-06-14 – 2021-06-19 (×11): 100 mg via ORAL
  Filled 2021-06-14 (×12): qty 1

## 2021-06-14 MED ORDER — FILGRASTIM 480 MCG/1.6ML IJ SOLN
480.0000 ug | Freq: Every day | INTRAMUSCULAR | Status: DC
  Administered 2021-06-14 – 2021-06-18 (×5): 480 ug via SUBCUTANEOUS
  Filled 2021-06-14 (×6): qty 1.6

## 2021-06-14 MED ORDER — PROPRANOLOL HCL ER 80 MG PO CP24
80.0000 mg | ORAL_CAPSULE | Freq: Every day | ORAL | Status: DC
  Administered 2021-06-14: 80 mg via ORAL
  Filled 2021-06-14 (×2): qty 1

## 2021-06-14 MED ORDER — SACCHAROMYCES BOULARDII 250 MG PO CAPS
250.0000 mg | ORAL_CAPSULE | Freq: Two times a day (BID) | ORAL | Status: DC
  Administered 2021-06-14 (×2): 250 mg via ORAL
  Filled 2021-06-14 (×2): qty 1

## 2021-06-14 MED ORDER — SIMVASTATIN 40 MG PO TABS
40.0000 mg | ORAL_TABLET | Freq: Every day | ORAL | Status: DC
  Administered 2021-06-14 – 2021-06-18 (×5): 40 mg via ORAL
  Filled 2021-06-14 (×5): qty 1

## 2021-06-14 MED ORDER — DOXAZOSIN MESYLATE 2 MG PO TABS
2.0000 mg | ORAL_TABLET | Freq: Every day | ORAL | Status: DC
  Administered 2021-06-14: 2 mg via ORAL
  Filled 2021-06-14: qty 1

## 2021-06-14 MED ORDER — LEVOTHYROXINE SODIUM 150 MCG PO TABS
150.0000 ug | ORAL_TABLET | Freq: Every day | ORAL | Status: DC
  Administered 2021-06-14 – 2021-06-19 (×6): 150 ug via ORAL
  Filled 2021-06-14 (×6): qty 1

## 2021-06-14 MED ORDER — HYDROCODONE-ACETAMINOPHEN 5-325 MG PO TABS
1.0000 | ORAL_TABLET | Freq: Four times a day (QID) | ORAL | Status: DC | PRN
  Administered 2021-06-16: 1 via ORAL
  Filled 2021-06-14 (×2): qty 1

## 2021-06-14 MED ORDER — MELATONIN 5 MG PO TABS
5.0000 mg | ORAL_TABLET | Freq: Every day | ORAL | Status: DC
  Administered 2021-06-14 – 2021-06-18 (×5): 5 mg via ORAL
  Filled 2021-06-14 (×5): qty 1

## 2021-06-14 MED ORDER — KETOCONAZOLE 2 % EX SHAM
1.0000 "application " | MEDICATED_SHAMPOO | CUTANEOUS | Status: DC
  Administered 2021-06-15 – 2021-06-19 (×2): 1 via TOPICAL
  Filled 2021-06-14: qty 120

## 2021-06-14 NOTE — Progress Notes (Signed)
Date and time results received: 06/14/21 0625 (use smartphrase ".now" to insert current time)  Test: CBC Critical Value: WBC 0.8  Platelets 29  Name of Provider Notified: Clarene Essex  Orders Received? Or Actions Taken?: Awaiting orders

## 2021-06-14 NOTE — Consult Note (Signed)
Referral MD  Reason for Referral: Pneumonia; acute myeloid leukemia in remission  Chief Complaint  Patient presents with   Shortness of Breath  : I had [name] hard time breathing.  HPI: Victor [name] is [name] very nice 85 year old white male.  He is well-known to me.  He has acute myeloid leukemia.  He actually has had [name] incredible response to treatment.  He has been on Vidaza and venetoclax.  He has been in [name] hematologic remission.  He has last cycle of treatment about [name] week or so ago.  He was doing well.  He then began having some shortness of breath.  He had [name] little bit of [name] cough.  There is no fever.  He had no nausea or vomiting.  He ultimately ended up in the emergency room.  He had [name] CT angiogram of the chest done.  There is no pulmonary emboli.  He had patchy airspace opacities bilaterally.  His lab work today shows [name] white count 0.8.  Hemoglobin 9.2.  Platelet count 29,000.  Again, he has been in remission hematologically.  I think we need to put him on some Neupogen to try to get his white cell count up [name] little bit.  His electrolytes show BUN of 10 creatinine 0.91.  Calcium 9.7 with an albumin of 2.5.  He has had no leg swelling.  He has had no rashes.  Therapy has had some diarrhea..  Overall, his performance status is ECOG 1.    Past Medical History:  Diagnosis Date   Arthritis    Atherosclerosis    Atherosclerotic PVD with intermittent claudication (HCC)    stent left leg   BPH (benign prostatic hyperplasia)    Bulging lumbar disc    Cancer ([place]) 11/12/14   vocal cord  carcinoma in situ , radiation; thyroid cancer   Carotid bruit    COPD (chronic obstructive pulmonary disease) (HCC)    DDD (degenerative disc disease), lumbar    Dysphonia    Dysplasia of true vocal cord    GERD (gastroesophageal reflux disease)    H/O carotid atherosclerosis    b/l   Hyperlipidemia    Hypothyroidism    Occasional tremors    left hand managed with propranolol   Peripheral vascular  angioplasty status with implants and grafts    Precancerous lesion 03/05/2018   premelanoma removed from back.    Radiation 03/10/15- 04/18/16   Larynx   Sleep apnea    hasn't used CPAP in years   Spondylosis of lumbosacral region    Thyroid disease   :   Past Surgical History:  Procedure Laterality Date   APPENDECTOMY     COLONOSCOPY W/ POLYPECTOMY     DIODE LASER APPLICATION Left 16/08/9603   Procedure: DIODE LASER APPLICATION;  Surgeon: Victor Pedro, MD;  Location: [facility];  Service: Ophthalmology;  Laterality: Left;   IR IMAGING GUIDED PORT INSERTION  01/16/2021   IR IVC FILTER PLMT / S&I /IMG GUID/MOD SED  03/17/2021   MEMBRANE PEEL Left 08/18/2019   Procedure: MEMBRANE PEEL;  Surgeon: Victor Pedro, MD;  Location: [facility];  Service: Ophthalmology;  Laterality: Left;   MOUTH SURGERY     tooth extraction   PARS PLANA VITRECTOMY Left 08/18/2019   PARS PLANA VITRECTOMY 27 GAUGE (Left)   PARS PLANA VITRECTOMY 27 GAUGE Left 08/18/2019   Procedure: PARS PLANA VITRECTOMY 27 GAUGE;  Surgeon: Victor Pedro, MD;  Location: [facility];  Service: Ophthalmology;  Laterality: Left;  PHOTOCOAGULATION WITH LASER Left 08/18/2019   Procedure: PHOTOCOAGULATION WITH LASER;  Surgeon: Victor Pedro, MD;  Location: [facility];  Service: Ophthalmology;  Laterality: Left;   THYROID SURGERY     TONSILLECTOMY     vocal cord biopsy  11/12/14   squamous cell carcinoma in situ  :   Current Facility-Administered Medications:    albuterol (PROVENTIL) (2.5 MG/3ML) 0.083% nebulizer solution 2.5 mg, 2.5 mg, Nebulization, Q2H PRN, Victor [name], Victor A, DO   apixaban (ELIQUIS) tablet 2.5 mg, 2.5 mg, Oral, BID, Victor [name], Victor A, DO, 2.5 mg at 06/13/21 1945   ceFEPIme (MAXIPIME) 2 g in sodium chloride 0.9 % 100 mL IVPB, 2 g, Intravenous, Q8H, Victor [name], RPH, Last Rate: 200 mL/hr at 06/14/21 0630, 2 g at 06/14/21 0630   Chlorhexidine Gluconate Cloth 2 % PADS 6 each, 6 each, Topical, Daily, Victor [name], Victor A, DO, 6  each at 06/13/21 1944   filgrastim (NEUPOGEN) injection 480 mcg, 480 mcg, Subcutaneous, Daily, Victor [name], Victor Cobb, MD   guaiFENesin (MUCINEX) 12 hr tablet 600 mg, 600 mg, Oral, BID, Victor [name], Victor A, DO, 600 mg at 06/13/21 1946   metoprolol tartrate (LOPRESSOR) injection 5 mg, 5 mg, Intravenous, Q6H PRN, Victor [name], Victor A, DO, 5 mg at 06/13/21 1941   saccharomyces boulardii (FLORASTOR) capsule 250 mg, 250 mg, Oral, BID, Victor [name], Victor Cobb, MD   sodium chloride (OCEAN) 0.65 % nasal spray 1 spray, 1 spray, Each Nare, PRN, Victor [name], Victor A, DO   sodium chloride flush (NS) 0.9 % injection 10-40 mL, 10-40 mL, Intracatheter, PRN, Victor [name], Victor A, DO:   apixaban  2.5 mg Oral BID   Chlorhexidine Gluconate Cloth  6 each Topical Daily   filgrastim  480 mcg Subcutaneous Daily   guaiFENesin  600 mg Oral BID   saccharomyces boulardii  250 mg Oral BID  :   Allergies  Allergen Reactions   Augmentin [Amoxicillin-Pot Clavulanate] Diarrhea    SOB  :   Family History  Problem Relation Age of Onset   Colon cancer Father    Cancer Sister        Died of metastatic cancer   Leukemia Neg Hx   :   Social History   Socioeconomic History   Marital status: Married    Spouse name: Not on file   Number of children: 2   Years of education: Not on file   Highest education level: Not on file  Occupational History   Not on file  Tobacco Use   Smoking status: Former    Packs/day: 2.00    Years: 51.00    Pack years: 102.00    Types: Cigarettes    Start date: 68    Quit date: 03/05/2004    Years since quitting: 17.2   Smokeless tobacco: Never  Vaping Use   Vaping Use: Never used  Substance and Sexual Activity   Alcohol use: Yes    Alcohol/week: 3.0 standard drinks    Types: 3 Glasses of wine per week    Comment: occ   Drug use: No   Sexual activity: Not on file  Other Topics Concern   Not on file  Social History Narrative   Not on file   Social Determinants of Health   Financial Resource Strain:  Not on file  Food Insecurity: Not on file  Transportation Needs: Not on file  Physical Activity: Not on file  Stress: Not on file  Social Connections: Not on file  Intimate Partner Violence: Not on file  :  Pertinent items are noted in HPI.  Exam:    Physical Exam Vitals reviewed.  HENT:     Head: Normocephalic and atraumatic.  Eyes:     Pupils: Pupils are equal, round, and reactive to light.  Cardiovascular:     Rate and Rhythm: Normal rate and regular rhythm.     Heart sounds: Normal heart sounds.  Pulmonary:     Effort: Pulmonary effort is normal.     Breath sounds: Normal breath sounds.  Abdominal:     General: Bowel sounds are normal.     Palpations: Abdomen is soft.  Musculoskeletal:        General: No tenderness or deformity. Normal range of motion.     Cervical back: Normal range of motion.  Lymphadenopathy:     Cervical: No cervical adenopathy.  Skin:    General: Skin is warm and dry.     Findings: No erythema or rash.  Neurological:     Mental Status: He is alert and oriented to person, place, and time.  Psychiatric:        Behavior: Behavior normal.        Thought Content: Thought content normal.        Judgment: Judgment normal.    Patient Vitals for the past 24 hrs:  BP Temp Temp src Pulse Resp SpO2 Height Weight  06/14/21 0416 (!) 143/52 98.1 F (36.7 C) Oral (!) 101 20 95 % -- --  06/14/21 0024 139/60 100 F (37.8 C) Oral (!) 105 20 96 % -- --  06/13/21 2032 (!) 154/64 98.9 F (37.2 C) Oral (!) 102 20 100 % -- --  06/13/21 1940 -- -- -- -- -- -- 5\' 10"  (1.778 m) 203 lb 14.8 oz (92.5 kg)  06/13/21 1832 (!) 177/69 98.9 F (37.2 C) -- (!) 103 20 97 % -- --  06/13/21 1636 (!) 163/67 98.2 F (36.8 C) Oral (!) 101 (!) 24 100 % -- --  06/13/21 1500 (!) 161/57 -- -- 98 (!) 22 95 % -- --  06/13/21 1430 (!) 147/54 -- -- (!) 102 (!) 25 92 % -- --  06/13/21 1428 -- -- -- 100 (!) 22 93 % -- --  06/13/21 1423 -- -- -- 100 16 (!) 87 % -- --  06/13/21  1422 -- -- -- (!) 101 19 (!) 88 % -- --  06/13/21 1334 (!) 135/59 -- -- 98 (!) 24 94 % -- --  06/13/21 1300 134/69 -- -- 95 (!) 25 96 % -- --  06/13/21 1145 (!) 147/63 -- -- 98 (!) 25 95 % -- --  06/13/21 1130 (!) 148/61 -- -- (!) 102 11 96 % -- --  06/13/21 1110 -- -- -- (!) 102 19 96 % -- --  06/13/21 1108 -- -- -- -- -- (!) 88 % -- --  06/13/21 1034 (!) 170/70 98.6 F (37 C) Oral (!) 104 20 93 % -- --      Recent Labs    06/13/21 1109 06/14/21 0536  WBC 1.2* 0.8*  HGB 10.1* 9.2*  HCT 30.8* 29.0*  PLT 33* 29*    Recent Labs    06/13/21 1109 06/14/21 0536  NA 134* 136  K 3.8 3.6  CL 101 102  CO2 24 25  GLUCOSE 132* 108*  BUN 11 10  CREATININE 0.86 0.91  CALCIUM 7.9* 7.9*    Blood smear review: None  Pathology: None    Assessment and Plan: Mr. Granieri is [name] very nice  85 year old white male.  He has acute myeloid leukemia.  He is in remission hematologically.  I will go ahead and start him on some Neupogen.  I do not see [name] problem with doing this.  This is only short-term.  We really need to try to get his white cell count up.  Given that he had chemotherapy last week or so, his white cell count will trend downward.  I will put him on [name] probiotic to help with diarrhea.  We will have to see if any cultures come back positive.  I do not think this is anything related to heart issues.  I will think he has had any heart trouble.  His platelet count is low but he does not need to be transfused.  I appreciate the great care he will get from all staff up on 5 E.  I will follow along and try to help out.  Victor Haw, MD  [place]uter[place] 15:10

## 2021-06-14 NOTE — Progress Notes (Signed)
PROGRESS NOTE    Victor [name]  KKX:[id number] DOB: Mar 21, 1936 DOA: 06/13/2021 PCP: [name], MD   Chief Complaint  Patient presents with   Shortness of Breath   Brief Narrative: 85 year old man with history of AML followed by Dr. [name], hypothyroidism PE/DVT on Eliquis presented with dyspnea x1 week, was seen by his PCP.  He was prescribed Augmentin after starting the medication he felt increasing shortness of breath, reports cough.  He was seen in the ED chest x-ray-showed chronic but increased bilateral pulmonary interstitial opacity differential includes mild/developing interstitial edema versus viral/atypical respiratory infection.  Patient admitted for possible pneumonia/hypoxia Blood work showed neutropenia, thrombocytopenia, and anemia BNP 60.8 troponin 9->9, influenza and COVID-19 screen negative  Subjective: He is on 4 L nasal cannula. Complains of feeling weak but no shortness of breath. Overnight T-max 100.  Mildly tachycardic. Further worsening neutropenia and thrombocytopenia overnight  Assessment & Plan:  Dyspnea on exertion, concern for pneumonia Acute hypoxic respiratory failure Underwent CT angio chest since admission no PE, patchy airspace opacities bilaterally suggestive of multifocal infiltrate, scattered small subpleural nodules.  BNP normal CHF ruled out.  Continue cefepime,  add doxycycline. follow-up strep antigen and  Legionella antigen in urine.  Continue antitussives, supplemental oxygen as needed nebs.  On 4 L nasal cannula weaned down to 3 L saturating 90 to 95%.  Pancytopenia with leukopenia thrombocytopenia anemia: Appreciate hematology input and starting Neupogen.  Continue on neutropenic precaution Recent Labs  Lab 06/12/21 0945 06/13/21 1109 06/14/21 0536  HGB 10.4* 10.1* 9.2*  HCT 32.1* 30.8* 29.0*  WBC 1.6* 1.2* 0.8*  PLT 32* 33* 29*    AML in remission followed by Dr. [name].  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  BPH Hypertension Continue home propanolol, Cardura  HLD: Resume statin  Hypothyroidism:Resume Synthroid  Deconditioning/debility in the setting of multiple comorbidities.  Hopefully PT OT from tomorrow  Diet Order             Diet regular Room service appropriate? Yes; Fluid consistency: Thin  Diet effective now                   Patient's Body mass index is 29.26 kg/m. DVT prophylaxis: apixaban (ELIQUIS) tablet 2.5 mg Start: 06/13/21 2200 Code Status:   Code Status: Full Code  Family Communication: plan of care discussed with patient at bedside. Status is: Admitted as observation Remains hospitalized for ongoing management of dyspnea, pancytopenia Dispo: The patient is from: Home              Anticipated d/c is to: Home              Patient currently is not medically stable to d/c.   Difficult to place patient No  Unresulted Labs (From admission, onward)     Start     Ordered   06/13/21 1807  Legionella Pneumophila Serogp 1 Ur Ag  (COPD / Pneumonia / Cellulitis / Lower Extremity Wound)  Once,   R        06/13/21 1810   06/13/21 1807  Strep pneumoniae urinary antigen  (COPD / Pneumonia / Cellulitis / Lower Extremity Wound)  Once,   R        06/13/21 1810           Medications reviewed:  Scheduled Meds:  apixaban  2.5 mg Oral BID   Chlorhexidine Gluconate Cloth  6 each Topical Daily   filgrastim  480 mcg  Subcutaneous Daily   guaiFENesin  600 mg Oral BID   saccharomyces boulardii  250 mg Oral BID   Continuous Infusions:  ceFEPime (MAXIPIME) IV 2 g (06/14/21 0630)   Consultants:see note  Procedures:see note Antimicrobials: Anti-infectives (From admission, onward)    Start     Dose/Rate Route Frequency Ordered Stop   06/13/21 2200  ceFEPIme (MAXIPIME) 2 g in sodium chloride 0.9 % 100 mL IVPB        2 g 200 mL/hr over 30 Minutes Intravenous Every 8 hours 06/13/21 1829     06/13/21 1345  ceFEPIme (MAXIPIME) 2 g in sodium chloride 0.9  % 100 mL IVPB        2 g 200 mL/hr over 30 Minutes Intravenous  Once 06/13/21 1342 06/13/21 1458   06/13/21 1300  cefTRIAXone (ROCEPHIN) 1 g in sodium chloride 0.9 % 100 mL IVPB  Status:  Discontinued        1 g 200 mL/hr over 30 Minutes Intravenous  Once 06/13/21 1259 06/13/21 1409   06/13/21 1300  azithromycin (ZITHROMAX) 500 mg in sodium chloride 0.9 % 250 mL IVPB  Status:  Discontinued        500 mg 250 mL/hr over 60 Minutes Intravenous  Once 06/13/21 1259 06/13/21 1342      Culture/Microbiology    Component Value Date/Time   SDES  03/15/2021 1149    URINE, RANDOM Performed at [facility], [address] [place], [place] [address]    [facility]  03/15/2021 1149    NONE Performed at [facility], [address] [place], [place] [address]    CULT MULTIPLE SPECIES PRESENT, SUGGEST RECOLLECTION (A) 03/15/2021 1149   REPTSTATUS 03/16/2021 FINAL 03/15/2021 1149    Other culture-see note  Objective: Vitals: Today's Vitals   06/13/21 2032 06/14/21 0024 06/14/21 0416 06/14/21 0900  BP: (!) 154/64 139/60 (!) 143/52   Pulse: (!) 102 (!) 105 (!) 101   Resp: 20 20 20    Temp: 98.9 F (37.2 C) 100 F (37.8 C) 98.1 F (36.7 C)   TempSrc: Oral Oral Oral   SpO2: 100% 96% 95%   Weight:      Height:      PainSc:    0-No pain    Intake/Output Summary (Last 24 hours) at 06/14/2021 0941 Last data filed at 06/14/2021 0630 Gross per 24 hour  Intake 295.96 ml  Output 200 ml  Net 95.96 ml   Filed Weights   06/13/21 1940  Weight: 92.5 kg   Weight change:   Intake/Output from previous day: 08/09 0701 - 08/10 0700 In: 296 [IV Piggyback:296] Out: 200 [Urine:200] Intake/Output this shift: No intake/output data recorded. Filed Weights   06/13/21 1940  Weight: 92.5 kg   Examination: General exam: AAO x3, elderly looking, chronically sick looking, obese,frail.  HEENT:Oral mucosa moist, Ear/Nose WNL grossly,dentition normal. Respiratory system:  bilaterally diminished,no use of accessory muscle, non tender. Cardiovascular system: S1 & S2 +,no JVD. Gastrointestinal system: Abdomen soft, NT,ND, BS+. Nervous System:Alert, awake, moving extremities Extremities: no edema, distal peripheral pulses palpable.  Skin: No rashes,no icterus. MSK: Normal muscle bulk,tone, power Data Reviewed: I have personally reviewed following labs and imaging studies CBC: Recent Labs  Lab 06/12/21 0945 06/13/21 1109 06/14/21 0536  WBC 1.6* 1.2* 0.8*  NEUTROABS 1.1* 0.7*  --   HGB 10.4* 10.1* 9.2*  HCT 32.1* 30.8* 29.0*  MCV 92.5 91.9 94.2  PLT 32* 33* 29*   Basic Metabolic Panel: Recent  Labs  Lab 06/12/21 0945 06/13/21 1109 06/14/21 0536  NA 133* 134* 136  K 3.9 3.8 3.6  CL 98 101 102  CO2 26 24 25   GLUCOSE 133* 132* 108*  BUN 11 11 10   CREATININE 1.00 0.86 0.91  CALCIUM 8.5* 7.9* 7.9*  MG 1.9  --   --    GFR: Estimated Creatinine Clearance: 70.3 mL/min (by C-G formula based on SCr of 0.91 mg/dL). Liver Function Tests: Recent Labs  Lab 06/12/21 0945 06/13/21 1109 06/14/21 0536  AST 9* 13* 10*  ALT 11 12 13   ALKPHOS 62 57 53  BILITOT 1.2 0.8 1.0  PROT 5.5* 5.5* 5.2*  ALBUMIN 3.3* 2.8* 2.5*   No results for input(s): LIPASE, AMYLASE in the last 168 hours. No results for input(s): AMMONIA in the last 168 hours. Coagulation Profile: No results for input(s): INR, PROTIME in the last 168 hours. Cardiac Enzymes: No results for input(s): CKTOTAL, CKMB, CKMBINDEX, TROPONINI in the last 168 hours. BNP (last 3 results) No results for input(s): PROBNP in the last 8760 hours. HbA1C: No results for input(s): HGBA1C in the last 72 hours. CBG: No results for input(s): GLUCAP in the last 168 hours. Lipid Profile: No results for input(s): CHOL, HDL, LDLCALC, TRIG, CHOLHDL, LDLDIRECT in the last 72 hours. Thyroid Function Tests: No results for input(s): TSH, T4TOTAL, FREET4, T3FREE, THYROIDAB in the last 72 hours. Anemia Panel: No  results for input(s): VITAMINB12, FOLATE, FERRITIN, TIBC, IRON, RETICCTPCT in the last 72 hours. Sepsis Labs: No results for input(s): PROCALCITON, LATICACIDVEN in the last 168 hours.  Recent Results (from the past 240 hour(s))  Resp Panel by RT-PCR (Flu A&B, Covid) Nasopharyngeal Swab     Status: None   Collection Time: 06/13/21 11:09 AM   Specimen: Nasopharyngeal Swab; Nasopharyngeal(NP) swabs in vial transport medium  Result Value Ref Range Status   SARS Coronavirus 2 by RT PCR NEGATIVE NEGATIVE Final    Comment: (NOTE) SARS-CoV-2 target nucleic acids are NOT DETECTED.  The SARS-CoV-2 RNA is generally detectable in upper respiratory specimens during the acute phase of infection. The lowest concentration of SARS-CoV-2 viral copies this assay can detect is 138 copies/mL. A negative result does not preclude SARS-Cov-2 infection and should not be used as the sole basis for treatment or other patient management decisions. A negative result may occur with  improper specimen collection/handling, submission of specimen other than nasopharyngeal swab, presence of viral mutation(s) within the areas targeted by this assay, and inadequate number of viral copies(<138 copies/mL). A negative result must be combined with clinical observations, patient history, and epidemiological information. The expected result is Negative.  Fact Sheet for Patients:  EntrepreneurPulse.com.au  Fact Sheet for Healthcare Providers:  IncredibleEmployment.be  This test is no t yet approved or cleared by the [place] FDA and  has been authorized for detection and/or diagnosis of SARS-CoV-2 by FDA under an Emergency Use Authorization (EUA). This EUA will remain  in effect (meaning this test can be used) for the duration of the COVID-19 declaration under Section 564(b)(1) of the Act, 21 U.S.C.section 360bbb-3(b)(1), unless the authorization is terminated  or revoked sooner.        Influenza A by PCR NEGATIVE NEGATIVE Final   Influenza B by PCR NEGATIVE NEGATIVE Final    Comment: (NOTE) The Xpert Xpress SARS-CoV-2/FLU/RSV plus assay is intended as an aid in the diagnosis of influenza from Nasopharyngeal swab specimens and should not be used as a sole basis for treatment. Nasal washings and aspirates  are unacceptable for Xpert Xpress SARS-CoV-2/FLU/RSV testing.  Fact Sheet for Patients: EntrepreneurPulse.com.au  Fact Sheet for Healthcare Providers: IncredibleEmployment.be  This test is not yet approved or cleared by the [place] FDA and has been authorized for detection and/or diagnosis of SARS-CoV-2 by FDA under an Emergency Use Authorization (EUA). This EUA will remain in effect (meaning this test can be used) for the duration of the COVID-19 declaration under Section 564(b)(1) of the Act, 21 U.S.C. section 360bbb-3(b)(1), unless the authorization is terminated or revoked.  Performed at [facility], [address] [place], [place] [address]   Respiratory (~20 pathogens) panel by PCR     Status: None   Collection Time: 06/13/21  6:08 PM   Specimen: Nasopharyngeal Swab; Respiratory  Result Value Ref Range Status   Adenovirus NOT DETECTED NOT DETECTED Final   Coronavirus 229E NOT DETECTED NOT DETECTED Final    Comment: (NOTE) The Coronavirus on the Respiratory Panel, DOES NOT test for the novel  Coronavirus (2019 nCoV)    Coronavirus HKU1 NOT DETECTED NOT DETECTED Final   Coronavirus NL63 NOT DETECTED NOT DETECTED Final   Coronavirus OC43 NOT DETECTED NOT DETECTED Final   Metapneumovirus NOT DETECTED NOT DETECTED Final   Rhinovirus / Enterovirus NOT DETECTED NOT DETECTED Final   Influenza A NOT DETECTED NOT DETECTED Final   Influenza B NOT DETECTED NOT DETECTED Final   Parainfluenza Virus 1 NOT DETECTED NOT DETECTED Final   Parainfluenza Virus 2 NOT DETECTED NOT DETECTED Final    Parainfluenza Virus 3 NOT DETECTED NOT DETECTED Final   Parainfluenza Virus 4 NOT DETECTED NOT DETECTED Final   Respiratory Syncytial Virus NOT DETECTED NOT DETECTED Final   Bordetella pertussis NOT DETECTED NOT DETECTED Final   Bordetella Parapertussis NOT DETECTED NOT DETECTED Final   Chlamydophila pneumoniae NOT DETECTED NOT DETECTED Final   Mycoplasma pneumoniae NOT DETECTED NOT DETECTED Final    Comment: Performed at [facility], [place]. [address] [place], [place] [address]     Radiology Studies: CT Angio Chest Pulmonary Embolism (PE) W or WO Contrast  Result Date: 06/13/2021 CLINICAL DATA:  Respiratory difficulty EXAM: CT ANGIOGRAPHY CHEST WITH CONTRAST TECHNIQUE: Multidetector CT imaging of the chest was performed using the standard protocol during bolus administration of intravenous contrast. Multiplanar CT image reconstructions and MIPs were obtained to evaluate the vascular anatomy. CONTRAST:  48mL OMNIPAQUE IOHEXOL 350 MG/ML SOLN COMPARISON:  Chest x-ray from earlier in the same day, CT from 04/17/2021 FINDINGS: Cardiovascular: Thoracic aorta and its branches demonstrate atherosclerotic calcifications without aneurysmal dilatation or dissection. No cardiac enlargement is noted. Coronary calcifications are seen. The pulmonary artery is well visualized within normal branching pattern bilaterally. Previously seen filling defects have resolved in the interval. No new pulmonary emboli are seen. The peripheral lower lobe branches are limited somewhat due to patient respiratory artifact. Mediastinum/Nodes: Thoracic inlet is within normal limits. No sizable hilar or mediastinal adenopathy is noted. The esophagus as visualized is within normal limits. Lungs/Pleura: Patchy airspace opacities are noted in the lower lobes bilaterally as well as in the left lingula considerable motion artifact is noted. A few scattered small subpleural nodules are seen better visualized on the current exam than on the  prior exam due to slice thickness. No dominant sizable nodule is seen. Small right-sided pleural effusion is noted. Emphysematous changes are again noted. Upper Abdomen: Visualized upper abdomen shows no acute abnormality. Musculoskeletal: No chest wall abnormality. No acute or significant osseous findings. Review of the  MIP images confirms the above findings. IMPRESSION: No acute pulmonary emboli are identified. Patient motion artifact somewhat limits the exam however. Previously seen residual emboli have resolved in the interval. Patchy airspace opacities bilaterally suggestive of multifocal infiltrate. Scattered small subpleural nodules measuring less than 5 mm. These are better visualized than on the prior exam due to thinner slice thickness. No follow-up needed if patient is low-risk (and has no known or suspected primary neoplasm). Non-contrast chest CT can be considered in 12 months if patient is high-risk. This recommendation follows the consensus statement: Guidelines for Management of Incidental Pulmonary Nodules Detected on CT Images: From the Fleischner Society 2017; Radiology 2017; 284:228-243. Aortic Atherosclerosis (ICD10-I70.0) and Emphysema (ICD10-J43.9). Electronically Signed   By: Inez Catalina M.D.   On: 06/13/2021 20:30   DG Chest Port 1 View  Result Date: 06/13/2021 CLINICAL DATA:  85 year old male with shortness of breath for 5-6 days after starting new medication. EXAM: PORTABLE CHEST 1 VIEW COMPARISON:  Chest CTA 04/17/2021 and earlier. FINDINGS: Portable AP semi upright view at 1048 hours. Stable right chest dual lumen power port. Lung volumes and mediastinal contours are stable and within normal limits. Chronic but increased bilateral, basilar predominant pulmonary interstitial opacitysince May. No confluent opacity. No pneumothorax. No pleural effusion. No acute osseous abnormality identified. IMPRESSION: Chronic but increased bilateral pulmonary interstitial opacity. Differential  considerations include mild/developing interstitial edema versus viral/atypical respiratory infection. Electronically Signed   By: Genevie Ann M.D.   On: 06/13/2021 11:44     LOS: 0 days   Antonieta Pert, MD [facility]  06/14/2021, 9:41 AM

## 2021-06-14 NOTE — Progress Notes (Signed)
Patient successfully weaned down from 4L to 2L. Patient currently satting between 95-97% on 2.

## 2021-06-15 DIAGNOSIS — R0602 Shortness of breath: Secondary | ICD-10-CM | POA: Diagnosis not present

## 2021-06-15 DIAGNOSIS — C9201 Acute myeloblastic leukemia, in remission: Secondary | ICD-10-CM

## 2021-06-15 LAB — CBC
HCT: 26.5 % — ABNORMAL LOW (ref 39.0–52.0)
Hemoglobin: 8.4 g/dL — ABNORMAL LOW (ref 13.0–17.0)
MCH: 30 pg (ref 26.0–34.0)
MCHC: 31.7 g/dL (ref 30.0–36.0)
MCV: 94.6 fL (ref 80.0–100.0)
Platelets: 26 10*3/uL — CL (ref 150–400)
RBC: 2.8 MIL/uL — ABNORMAL LOW (ref 4.22–5.81)
RDW: 16.9 % — ABNORMAL HIGH (ref 11.5–15.5)
WBC: 0.8 10*3/uL — CL (ref 4.0–10.5)
nRBC: 0 % (ref 0.0–0.2)

## 2021-06-15 LAB — BASIC METABOLIC PANEL
Anion gap: 7 (ref 5–15)
BUN: 13 mg/dL (ref 8–23)
CO2: 25 mmol/L (ref 22–32)
Calcium: 7.6 mg/dL — ABNORMAL LOW (ref 8.9–10.3)
Chloride: 103 mmol/L (ref 98–111)
Creatinine, Ser: 0.86 mg/dL (ref 0.61–1.24)
GFR, Estimated: >60 mL/min (ref >60)
Glucose, Bld: 116 mg/dL — ABNORMAL HIGH (ref 70–99)
Potassium: 3.6 mmol/L (ref 3.5–5.1)
Sodium: 135 mmol/L (ref 135–145)

## 2021-06-15 LAB — STREP PNEUMONIAE URINARY ANTIGEN: Strep Pneumo Urinary Antigen: NEGATIVE

## 2021-06-15 MED ORDER — SACCHAROMYCES BOULARDII 250 MG PO CAPS
250.0000 mg | ORAL_CAPSULE | Freq: Two times a day (BID) | ORAL | Status: DC
  Administered 2021-06-15 – 2021-06-19 (×9): 250 mg via ORAL
  Filled 2021-06-15 (×9): qty 1

## 2021-06-15 MED ORDER — FLUCONAZOLE 100 MG PO TABS
100.0000 mg | ORAL_TABLET | Freq: Every day | ORAL | Status: DC
  Filled 2021-06-15: qty 1

## 2021-06-15 MED ORDER — PANTOPRAZOLE SODIUM 40 MG PO TBEC
40.0000 mg | DELAYED_RELEASE_TABLET | Freq: Every day | ORAL | Status: DC
  Administered 2021-06-15 – 2021-06-18 (×4): 40 mg via ORAL
  Filled 2021-06-15 (×4): qty 1

## 2021-06-15 MED ORDER — FLUCONAZOLE 100 MG PO TABS
100.0000 mg | ORAL_TABLET | Freq: Every day | ORAL | Status: DC
  Administered 2021-06-15 – 2021-06-18 (×4): 100 mg via ORAL
  Filled 2021-06-15 (×5): qty 1

## 2021-06-15 MED ORDER — DOXAZOSIN MESYLATE 1 MG PO TABS
1.0000 mg | ORAL_TABLET | Freq: Every day | ORAL | Status: DC
  Administered 2021-06-15 – 2021-06-18 (×4): 1 mg via ORAL
  Filled 2021-06-15 (×5): qty 1

## 2021-06-15 MED ORDER — GUAIFENESIN ER 600 MG PO TB12
600.0000 mg | ORAL_TABLET | Freq: Two times a day (BID) | ORAL | Status: DC
  Administered 2021-06-15 – 2021-06-19 (×9): 600 mg via ORAL
  Filled 2021-06-15 (×9): qty 1

## 2021-06-15 NOTE — Progress Notes (Signed)
Overall, Victor [name] is about the same.  He started the Neupogen.  His white cell count is still the same at 0.8.  His hemoglobin is 8.4.  Platelet count is 26,000.  I would not give any transfusions as of yet.  So far, all of his cultures have come back negative.  He is on antibiotics.  He is on acyclovir.  I will start the Diflucan.  He has a decreased oxygen requirement.  He is now down to 2 L.  His appetite is not doing all that great.  He has had no nausea or vomiting.  His diarrhea might be a little bit better.  I started him on some Florastor as a probiotic.  There is been no bleeding.  He is on Eliquis at 2.5 mg p.o. twice daily.  His temperature is 100.1.  Pulse 85.  Blood pressure 92/53.  His lungs sound pretty clear.  There is no wheezing.  He has good air movement bilaterally.  Cardiac exam regular rate and rhythm.  Abdomen is soft.  Bowel sounds are present.  There may be slightly decreased.  There is no guarding or rebound tenderness.  Oral exam does not show any mucositis.  Extremity shows no clubbing, cyanosis or edema.  Neurological exam is nonfocal.  Again, we have to continue him on the Neupogen.  We does have to try to get the white cell count up.  I am sure the white cell count is down because of his recent chemotherapy.  I appreciate the great care that he is getting from all the staff on 5 E.  Victor Haw, MD  Psalm 5:12

## 2021-06-15 NOTE — Progress Notes (Signed)
PROGRESS NOTE    Victor [name]  TMH:[id number] DOB: 1936-07-05 DOA: 06/13/2021 PCP: [name], MD   Chief Complaint  Patient presents with   Shortness of Breath   Brief Narrative: 85 year old man with history of AML followed by Dr. [name], hypothyroidism PE/DVT on Eliquis presented with dyspnea x1 week, was seen by his PCP.  He was prescribed Augmentin after starting the medication he felt increasing shortness of breath, reports cough.  He was seen in the ED chest x-ray-showed chronic but increased bilateral pulmonary interstitial opacity differential includes mild/developing interstitial edema versus viral/atypical respiratory infection.  Patient admitted for possible pneumonia/hypoxia Blood work showed neutropenia, thrombocytopenia, and anemia BNP 60.8 troponin 9->9, influenza and COVID-19 screen negative  Subjective: Seen examined this morning.  Reports shortness of breath is better.  Somewhat more stronger today. Overnight T-max 100.1, BP 92-120 Counts appear same/slow Assessment & Plan:  Dyspnea on exertion, concern for pneumonia Acute hypoxic respiratory failure Underwent CT angio chest - has no PE, patchy airspace opacities bilaterally suggestive of multifocal infiltrate, scattered small subpleural nodules.  BNP normal CHF ruled out.  Continue cefepime,  doxycycline.  Streptococcus pneumonia antigen negative, Legionella antigen pending, respiratory virus panel negative.  Obtain sputum culture if possible.Continue antitussives, supplemental oxygen as needed nebs.  He is weaned down to 2 L nasal cannula continue supplemental oxygen to maintain at least 92% or above  Pancytopenia with leukopenia thrombocytopenia anemia: Appreciate hematology input and continue Neupogen and monitor counts daily.  Dr. [name] is following, continue on neutropenic precautions.  Diflucan added today by oncology.  Continue his home acyclovir Recent Labs  Lab 06/13/21 1109 06/14/21 0536  06/15/21 0423  HGB 10.1* 9.2* 8.4*  HCT 30.8* 29.0* 26.5*  WBC 1.2* 0.8* 0.8*  PLT 33* 29* 26*  AML in remission followed by Dr. [name].  Monitor his counts as above  DVT/PE history: cont his Eliquis.  BPH Hypertension Blood pressure soft hold propanolol, decrease Cardura to 1 mg.    HLD: Continue statin  Hypothyroidism: Continue Synthroid  Deconditioning/debility in the setting of multiple comorbidities.  Agreeable for PT OT today.  Diet Order             Diet regular Room service appropriate? Yes; Fluid consistency: Thin  Diet effective now                   Patient's Body mass index is 29.26 kg/m. DVT prophylaxis: apixaban (ELIQUIS) tablet 2.5 mg Start: 06/13/21 2200 Code Status:   Code Status: Full Code  Family Communication: plan of care discussed with patient at bedside. Status is: Admitted as observation Remains hospitalized for ongoing management of dyspnea, pancytopenia Dispo: The patient is from: Home              Anticipated d/c is to: TBD pending PT eval              Patient currently is not medically stable to d/c.   Difficult to place patient No  Unresulted Labs (From admission, onward)     Start     Ordered   06/15/21 0829  Expectorated Sputum Assessment w Gram Stain, Rflx to Resp Cult  Once,   R        06/15/21 0829   06/15/21 0500  CBC  Daily,   R     Question:  Specimen collection method  Answer:  IV Team=IV Team collect   06/14/21 9211   06/15/21 9417  Basic metabolic panel  Daily,  R     Question:  Specimen collection method  Answer:  IV Team=IV Team collect   06/14/21 2956   06/13/21 1807  Legionella Pneumophila Serogp 1 Ur Ag  (COPD / Pneumonia / Cellulitis / Lower Extremity Wound)  Once,   R        06/13/21 1810           Medications reviewed:  Scheduled Meds:  acyclovir  400 mg Oral BID   apixaban  2.5 mg Oral BID   Chlorhexidine Gluconate Cloth  6 each Topical Daily   doxazosin  1 mg Oral QHS   doxycycline  100 mg Oral Q12H    filgrastim  480 mcg Subcutaneous Daily   fluconazole  100 mg Oral Q breakfast   guaiFENesin  600 mg Oral BID WC   ketoconazole  1 application Topical Once per day on Mon Thu   levothyroxine  150 mcg Oral Q0600   loratadine  10 mg Oral QHS   melatonin  5 mg Oral QHS   pantoprazole  40 mg Oral q1800   saccharomyces boulardii  250 mg Oral BID WC   simvastatin  40 mg Oral q1800   Continuous Infusions:  ceFEPime (MAXIPIME) IV 2 g (06/15/21 0730)   Consultants:see note  Procedures:see note Antimicrobials: Anti-infectives (From admission, onward)    Start     Dose/Rate Route Frequency Ordered Stop   06/15/21 1000  fluconazole (DIFLUCAN) tablet 100 mg  Status:  Discontinued        100 mg Oral Daily 06/15/21 0726 06/15/21 0915   06/15/21 1000  fluconazole (DIFLUCAN) tablet 100 mg        100 mg Oral Daily with breakfast 06/15/21 0915     06/14/21 1100  doxycycline (VIBRA-TABS) tablet 100 mg        100 mg Oral Every 12 hours 06/14/21 1001     06/14/21 1045  acyclovir (ZOVIRAX) tablet 400 mg        400 mg Oral 2 times daily 06/14/21 0950     06/13/21 2200  ceFEPIme (MAXIPIME) 2 g in sodium chloride 0.9 % 100 mL IVPB        2 g 200 mL/hr over 30 Minutes Intravenous Every 8 hours 06/13/21 1829     06/13/21 1345  ceFEPIme (MAXIPIME) 2 g in sodium chloride 0.9 % 100 mL IVPB        2 g 200 mL/hr over 30 Minutes Intravenous  Once 06/13/21 1342 06/13/21 1458   06/13/21 1300  cefTRIAXone (ROCEPHIN) 1 g in sodium chloride 0.9 % 100 mL IVPB  Status:  Discontinued        1 g 200 mL/hr over 30 Minutes Intravenous  Once 06/13/21 1259 06/13/21 1409   06/13/21 1300  azithromycin (ZITHROMAX) 500 mg in sodium chloride 0.9 % 250 mL IVPB  Status:  Discontinued        500 mg 250 mL/hr over 60 Minutes Intravenous  Once 06/13/21 1259 06/13/21 1342      Culture/Microbiology    Component Value Date/Time   SDES  03/15/2021 1149    URINE, RANDOM Performed at [facility], [address] [place], [place] [address]    [facility]  03/15/2021 1149    NONE Performed at [facility], [address] [place], [place] [address]    CULT MULTIPLE SPECIES PRESENT, SUGGEST RECOLLECTION (A) 03/15/2021 1149   REPTSTATUS 03/16/2021 FINAL 03/15/2021 1149    Other culture-see note  Objective:  Vitals: Today's Vitals   06/14/21 1255 06/14/21 1526 06/14/21 2015 06/15/21 0440  BP: (!) 142/46 (!) 112/41 (!) 112/55 (!) 92/53  Pulse: 100 95 94 85  Resp:  18 17 17   Temp: 98.7 F (37.1 C) 99.3 F (37.4 C) 99.9 F (37.7 C) 100.1 F (37.8 C)  TempSrc: Oral Oral  Oral  SpO2: 96% 97% 94% 94%  Weight:      Height:      PainSc:    0-No pain    Intake/Output Summary (Last 24 hours) at 06/15/2021 1032 Last data filed at 06/14/2021 1600 Gross per 24 hour  Intake 200 ml  Output 200 ml  Net 0 ml   Filed Weights   06/13/21 1940  Weight: 92.5 kg   Weight change:   Intake/Output from previous day: 08/10 0701 - 08/11 0700 In: 200 [IV Piggyback:200] Out: 200 [Urine:200] Intake/Output this shift: No intake/output data recorded. Filed Weights   06/13/21 1940  Weight: 92.5 kg   Examination:  General exam: AAOx 3, weak frail elderly.  Not in distress.  On oxygen.  HEENT:Oral mucosa moist, Ear/Nose WNL grossly, dentition normal. Respiratory system: bilaterally diminished, basal crackles present bilaterally, no use of accessory muscle Cardiovascular system: S1 & S2 +, No JVD,. Gastrointestinal system: Abdomen soft,NT,ND, BS+ Nervous System:Alert, awake, moving extremities and grossly nonfocal Extremities: no edema, distal peripheral pulses palpable.  Skin: No rashes,no icterus. MSK: Normal muscle bulk,tone, power   Data Reviewed: I have personally reviewed following labs and imaging studies CBC: Recent Labs  Lab 06/12/21 0945 06/13/21 1109 06/14/21 0536 06/15/21 0423  WBC 1.6* 1.2* 0.8* 0.8*  NEUTROABS 1.1* 0.7*  --   --   HGB 10.4* 10.1* 9.2* 8.4*  HCT 32.1*  30.8* 29.0* 26.5*  MCV 92.5 91.9 94.2 94.6  PLT 32* 33* 29* 26*   Basic Metabolic Panel: Recent Labs  Lab 06/12/21 0945 06/13/21 1109 06/14/21 0536 06/15/21 0423  NA 133* 134* 136 135  K 3.9 3.8 3.6 3.6  CL 98 101 102 103  CO2 26 24 25 25   GLUCOSE 133* 132* 108* 116*  BUN 11 11 10 13   CREATININE 1.00 0.86 0.91 0.86  CALCIUM 8.5* 7.9* 7.9* 7.6*  MG 1.9  --   --   --    GFR: Estimated Creatinine Clearance: 74.4 mL/min (by C-G formula based on SCr of 0.86 mg/dL). Liver Function Tests: Recent Labs  Lab 06/12/21 0945 06/13/21 1109 06/14/21 0536  AST 9* 13* 10*  ALT 11 12 13   ALKPHOS 62 57 53  BILITOT 1.2 0.8 1.0  PROT 5.5* 5.5* 5.2*  ALBUMIN 3.3* 2.8* 2.5*   No results for input(s): LIPASE, AMYLASE in the last 168 hours. No results for input(s): AMMONIA in the last 168 hours. Coagulation Profile: No results for input(s): INR, PROTIME in the last 168 hours. Cardiac Enzymes: No results for input(s): CKTOTAL, CKMB, CKMBINDEX, TROPONINI in the last 168 hours. BNP (last 3 results) No results for input(s): PROBNP in the last 8760 hours. HbA1C: No results for input(s): HGBA1C in the last 72 hours. CBG: No results for input(s): GLUCAP in the last 168 hours. Lipid Profile: No results for input(s): CHOL, HDL, LDLCALC, TRIG, CHOLHDL, LDLDIRECT in the last 72 hours. Thyroid Function Tests: No results for input(s): TSH, T4TOTAL, FREET4, T3FREE, THYROIDAB in the last 72 hours. Anemia Panel: No results for input(s): VITAMINB12, FOLATE, FERRITIN, TIBC, IRON, RETICCTPCT in the last 72 hours. Sepsis Labs: No results for input(s): PROCALCITON, LATICACIDVEN in the last 168  hours.  Recent Results (from the past 240 hour(s))  Resp Panel by RT-PCR (Flu A&B, Covid) Nasopharyngeal Swab     Status: None   Collection Time: 06/13/21 11:09 AM   Specimen: Nasopharyngeal Swab; Nasopharyngeal(NP) swabs in vial transport medium  Result Value Ref Range Status   SARS Coronavirus 2 by RT PCR  NEGATIVE NEGATIVE Final    Comment: (NOTE) SARS-CoV-2 target nucleic acids are NOT DETECTED.  The SARS-CoV-2 RNA is generally detectable in upper respiratory specimens during the acute phase of infection. The lowest concentration of SARS-CoV-2 viral copies this assay can detect is 138 copies/mL. A negative result does not preclude SARS-Cov-2 infection and should not be used as the sole basis for treatment or other patient management decisions. A negative result may occur with  improper specimen collection/handling, submission of specimen other than nasopharyngeal swab, presence of viral mutation(s) within the areas targeted by this assay, and inadequate number of viral copies(<138 copies/mL). A negative result must be combined with clinical observations, patient history, and epidemiological information. The expected result is Negative.  Fact Sheet for Patients:  EntrepreneurPulse.com.au  Fact Sheet for Healthcare Providers:  IncredibleEmployment.be  This test is no t yet approved or cleared by the [place] FDA and  has been authorized for detection and/or diagnosis of SARS-CoV-2 by FDA under an Emergency Use Authorization (EUA). This EUA will remain  in effect (meaning this test can be used) for the duration of the COVID-19 declaration under Section 564(b)(1) of the Act, 21 U.S.C.section 360bbb-3(b)(1), unless the authorization is terminated  or revoked sooner.       Influenza A by PCR NEGATIVE NEGATIVE Final   Influenza B by PCR NEGATIVE NEGATIVE Final    Comment: (NOTE) The Xpert Xpress SARS-CoV-2/FLU/RSV plus assay is intended as an aid in the diagnosis of influenza from Nasopharyngeal swab specimens and should not be used as a sole basis for treatment. Nasal washings and aspirates are unacceptable for Xpert Xpress SARS-CoV-2/FLU/RSV testing.  Fact Sheet for Patients: EntrepreneurPulse.com.au  Fact Sheet for  Healthcare Providers: IncredibleEmployment.be  This test is not yet approved or cleared by the [place] FDA and has been authorized for detection and/or diagnosis of SARS-CoV-2 by FDA under an Emergency Use Authorization (EUA). This EUA will remain in effect (meaning this test can be used) for the duration of the COVID-19 declaration under Section 564(b)(1) of the Act, 21 U.S.C. section 360bbb-3(b)(1), unless the authorization is terminated or revoked.  Performed at [facility], [address] [place], [place] [address]   Respiratory (~20 pathogens) panel by PCR     Status: None   Collection Time: 06/13/21  6:08 PM   Specimen: Nasopharyngeal Swab; Respiratory  Result Value Ref Range Status   Adenovirus NOT DETECTED NOT DETECTED Final   Coronavirus 229E NOT DETECTED NOT DETECTED Final    Comment: (NOTE) The Coronavirus on the Respiratory Panel, DOES NOT test for the novel  Coronavirus (2019 nCoV)    Coronavirus HKU1 NOT DETECTED NOT DETECTED Final   Coronavirus NL63 NOT DETECTED NOT DETECTED Final   Coronavirus OC43 NOT DETECTED NOT DETECTED Final   Metapneumovirus NOT DETECTED NOT DETECTED Final   Rhinovirus / Enterovirus NOT DETECTED NOT DETECTED Final   Influenza A NOT DETECTED NOT DETECTED Final   Influenza B NOT DETECTED NOT DETECTED Final   Parainfluenza Virus 1 NOT DETECTED NOT DETECTED Final   Parainfluenza Virus 2 NOT DETECTED NOT DETECTED Final   Parainfluenza Virus 3 NOT DETECTED NOT DETECTED  Final   Parainfluenza Virus 4 NOT DETECTED NOT DETECTED Final   Respiratory Syncytial Virus NOT DETECTED NOT DETECTED Final   Bordetella pertussis NOT DETECTED NOT DETECTED Final   Bordetella Parapertussis NOT DETECTED NOT DETECTED Final   Chlamydophila pneumoniae NOT DETECTED NOT DETECTED Final   Mycoplasma pneumoniae NOT DETECTED NOT DETECTED Final    Comment: Performed at [facility], [place] [address] [place], [place] [address]      Radiology Studies: CT Angio Chest Pulmonary Embolism (PE) W or WO Contrast  Result Date: 06/13/2021 CLINICAL DATA:  Respiratory difficulty EXAM: CT ANGIOGRAPHY CHEST WITH CONTRAST TECHNIQUE: Multidetector CT imaging of the chest was performed using the standard protocol during bolus administration of intravenous contrast. Multiplanar CT image reconstructions and MIPs were obtained to evaluate the vascular anatomy. CONTRAST:  45mL OMNIPAQUE IOHEXOL 350 MG/ML SOLN COMPARISON:  Chest x-ray from earlier in the same day, CT from 04/17/2021 FINDINGS: Cardiovascular: Thoracic aorta and its branches demonstrate atherosclerotic calcifications without aneurysmal dilatation or dissection. No cardiac enlargement is noted. Coronary calcifications are seen. The pulmonary artery is well visualized within normal branching pattern bilaterally. Previously seen filling defects have resolved in the interval. No new pulmonary emboli are seen. The peripheral lower lobe branches are limited somewhat due to patient respiratory artifact. Mediastinum/Nodes: Thoracic inlet is within normal limits. No sizable hilar or mediastinal adenopathy is noted. The esophagus as visualized is within normal limits. Lungs/Pleura: Patchy airspace opacities are noted in the lower lobes bilaterally as well as in the left lingula considerable motion artifact is noted. A few scattered small subpleural nodules are seen better visualized on the current exam than on the prior exam due to slice thickness. No dominant sizable nodule is seen. Small right-sided pleural effusion is noted. Emphysematous changes are again noted. Upper Abdomen: Visualized upper abdomen shows no acute abnormality. Musculoskeletal: No chest wall abnormality. No acute or significant osseous findings. Review of the MIP images confirms the above findings. IMPRESSION: No acute pulmonary emboli are identified. Patient motion artifact somewhat limits the exam however. Previously seen  residual emboli have resolved in the interval. Patchy airspace opacities bilaterally suggestive of multifocal infiltrate. Scattered small subpleural nodules measuring less than 5 mm. These are better visualized than on the prior exam due to thinner slice thickness. No follow-up needed if patient is low-risk (and has no known or suspected primary neoplasm). Non-contrast chest CT can be considered in 12 months if patient is high-risk. This recommendation follows the consensus statement: Guidelines for Management of Incidental Pulmonary Nodules Detected on CT Images: From the Fleischner Society 2017; Radiology 2017; 284:228-243. Aortic Atherosclerosis (ICD10-I70.0) and Emphysema (ICD10-J43.9). Electronically Signed   By: Inez Catalina M.D.   On: 06/13/2021 20:30   DG Chest Port 1 View  Result Date: 06/13/2021 CLINICAL DATA:  85 year old male with shortness of breath for 5-6 days after starting new medication. EXAM: PORTABLE CHEST 1 VIEW COMPARISON:  Chest CTA 04/17/2021 and earlier. FINDINGS: Portable AP semi upright view at 1048 hours. Stable right chest dual lumen power port. Lung volumes and mediastinal contours are stable and within normal limits. Chronic but increased bilateral, basilar predominant pulmonary interstitial opacitysince May. No confluent opacity. No pneumothorax. No pleural effusion. No acute osseous abnormality identified. IMPRESSION: Chronic but increased bilateral pulmonary interstitial opacity. Differential considerations include mild/developing interstitial edema versus viral/atypical respiratory infection. Electronically Signed   By: Genevie Ann M.D.   On: 06/13/2021 11:44     LOS: 1 day   Antonieta Pert, MD [facility]  06/15/2021, 10:32 AM

## 2021-06-16 ENCOUNTER — Other Ambulatory Visit: Payer: Self-pay

## 2021-06-16 DIAGNOSIS — R0602 Shortness of breath: Secondary | ICD-10-CM | POA: Diagnosis not present

## 2021-06-16 DIAGNOSIS — C9201 Acute myeloblastic leukemia, in remission: Secondary | ICD-10-CM | POA: Diagnosis not present

## 2021-06-16 LAB — CBC
HCT: 25.7 % — ABNORMAL LOW (ref 39.0–52.0)
Hemoglobin: 8.1 g/dL — ABNORMAL LOW (ref 13.0–17.0)
MCH: 29.6 pg (ref 26.0–34.0)
MCHC: 31.5 g/dL (ref 30.0–36.0)
MCV: 93.8 fL (ref 80.0–100.0)
Platelets: 28 10*3/uL — CL (ref 150–400)
RBC: 2.74 MIL/uL — ABNORMAL LOW (ref 4.22–5.81)
RDW: 16.8 % — ABNORMAL HIGH (ref 11.5–15.5)
WBC: 0.8 10*3/uL — CL (ref 4.0–10.5)
nRBC: 0 % (ref 0.0–0.2)

## 2021-06-16 LAB — BASIC METABOLIC PANEL
Anion gap: 8 (ref 5–15)
BUN: 13 mg/dL (ref 8–23)
CO2: 23 mmol/L (ref 22–32)
Calcium: 7.8 mg/dL — ABNORMAL LOW (ref 8.9–10.3)
Chloride: 104 mmol/L (ref 98–111)
Creatinine, Ser: 0.82 mg/dL (ref 0.61–1.24)
GFR, Estimated: >60 mL/min (ref >60)
Glucose, Bld: 108 mg/dL — ABNORMAL HIGH (ref 70–99)
Potassium: 3.3 mmol/L — ABNORMAL LOW (ref 3.5–5.1)
Sodium: 135 mmol/L (ref 135–145)

## 2021-06-16 LAB — PREPARE RBC (CROSSMATCH)

## 2021-06-16 MED ORDER — POTASSIUM CHLORIDE CRYS ER 20 MEQ PO TBCR
40.0000 meq | EXTENDED_RELEASE_TABLET | Freq: Once | ORAL | Status: AC
  Administered 2021-06-16: 40 meq via ORAL
  Filled 2021-06-16: qty 4

## 2021-06-16 MED ORDER — SODIUM CHLORIDE 0.9% IV SOLUTION: Freq: Once | INTRAVENOUS | Status: AC

## 2021-06-16 MED ORDER — POTASSIUM CHLORIDE CRYS ER 10 MEQ PO TBCR: 40.0000 meq | EXTENDED_RELEASE_TABLET | Freq: Once | ORAL | Status: DC

## 2021-06-16 MED ORDER — FUROSEMIDE 10 MG/ML IJ SOLN
20.0000 mg | Freq: Once | INTRAMUSCULAR | Status: AC
  Administered 2021-06-16: 20 mg via INTRAVENOUS
  Filled 2021-06-16: qty 2

## 2021-06-16 NOTE — Progress Notes (Signed)
Consulted to place PIV. Patient working with PT at this time. RN to place PIV consult if still needed.

## 2021-06-16 NOTE — Progress Notes (Signed)
Patient c/o 8/10 lower back pain and c/o chest pain. As per shift report the patient had 2 units PRBC. The PCP on call has been notified.

## 2021-06-16 NOTE — Progress Notes (Signed)
Pharmacy Antibiotic Note  Victor [name] is a 85 y.o. male admitted on 06/13/2021 with pneumonia.  Pharmacy has been consulted for cefepime dosing.  Day #4 Abx CXR suggestive of atypical infection  ANC 0.8  Tmax 100.1 SCr stable 0.82, CrCl 78 ml/hr  Plan: Continue Cefepime 2g IV q8h Doxycycline per MD Granix per Oncology F/u clinical improvement  Height: 5\' 10"  (177.8 cm) Weight: 92.5 kg (203 lb 14.8 oz) IBW/kg (Calculated) : 73  Temp (24hrs), Avg:98.6 F (37 C), Min:98 F (36.7 C), Max:99.1 F (37.3 C)  Recent Labs  Lab 06/12/21 0945 06/13/21 1109 06/14/21 0536 06/15/21 0423 06/16/21 0310  WBC 1.6* 1.2* 0.8* 0.8* 0.8*  CREATININE 1.00 0.86 0.91 0.86 0.82     Estimated Creatinine Clearance: 78 mL/min (by C-G formula based on SCr of 0.82 mg/dL).    Allergies  Allergen Reactions   Augmentin [Amoxicillin-Pot Clavulanate] Diarrhea    SOB    Antimicrobials this admission: 8/9 cefepime >>  8/10 Doxy >> Acyclovir Diflucan  Dose adjustments this admission:  Microbiology results: 8/9 Resp panel: neg 8/10 Urine strep Ag: neg 8/10 Urine legionella Ag: IP Sputum: ordered  Thank you for allowing pharmacy to be a part of this patient's care.  Peggyann Juba, PharmD, BCPS Pharmacy: [phone]  06/16/2021 8:05 AM

## 2021-06-16 NOTE — Evaluation (Signed)
Physical Therapy Evaluation Patient Details Name: Victor [name] MRN: [id number] DOB: 1935/11/07 Today's Date: 06/16/2021   History of Present Illness  Patient is an 85 year old male presenting to PCP with dyspnea x1 week. Blood work showing neutropenia, thrombocytopenia, and anemia. PMH includes AML, hypothyroidism PE/DVT on Eliquis.  Clinical Impression  Pt admitted as above and presenting with functional mobility limitations 2* ambulatory balance deficits and decreased endurance.  Prior to admit pt IND and did not use any AD for ambulation.  This date, pt attempted ambulation sans AD but with significant instability and transitioned to RW with marked improvement.  Pt initially on 4L O2 with sats 99%.  With increased distance ambulated, O2 titered down to 2L with sats at 92%.  On return to room, pt left on 3L at 94% - Rn aware.  Pt plans dc home with family assist.    Follow Up Recommendations No PT follow up    Equipment Recommendations  None recommended by PT    Recommendations for Other Services       Precautions / Restrictions Precautions Precautions: Fall Precaution Comments: monitor O2/HR Restrictions Weight Bearing Restrictions: No      Mobility  Bed Mobility               General bed mobility comments: Pt at EOB with nursing on arrival    Transfers Overall transfer level: Needs assistance Equipment used: None Transfers: Sit to/from Stand Sit to Stand: Min guard;Supervision         General transfer comment: sit to stand from edge of bed with steady assist for safety only.  Ambulation/Gait Ambulation/Gait assistance: Min assist;Min guard Gait Distance (Feet): 450 Feet Assistive device: Rolling walker (2 wheeled);1 person hand held assist Gait Pattern/deviations: Step-to pattern;Step-through pattern;Decreased step length - right;Decreased step length - left;Shuffle;Trunk flexed Gait velocity: decr   General Gait Details: Pt attempted ambulation sans AD  but with noted antalgic unstable gait.  Transitioned to RW with marked improvement in stability and min cues for posture and position from RW.  Stairs            Wheelchair Mobility    Modified Rankin (Stroke Patients Only)       Balance Overall balance assessment: Needs assistance Sitting-balance support: No upper extremity supported;Feet supported Sitting balance-Leahy Scale: Good     Standing balance support: No upper extremity supported Standing balance-Leahy Scale: Fair                               Pertinent Vitals/Pain Pain Assessment: Faces Faces Pain Scale: No hurt    Home Living Family/patient expects to be discharged to:: Private residence Living Arrangements: Spouse/significant other Available Help at Discharge: Family Type of Home: House Home Access: Level entry     Home Layout: One level Home Equipment: Environmental consultant - 2 wheels;Walker - 4 wheels;Cane - single point;Shower seat;Grab bars - tub/shower      Prior Function Level of Independence: Independent               Hand Dominance   Dominant Hand:  (did not specify)    Extremity/Trunk Assessment   Upper Extremity Assessment Upper Extremity Assessment: Overall WFL for tasks assessed    Lower Extremity Assessment Lower Extremity Assessment: Overall WFL for tasks assessed    Cervical / Trunk Assessment Cervical / Trunk Assessment: Normal  Communication   Communication: No difficulties  Cognition Arousal/Alertness: Awake/alert Behavior During Therapy: [facility]  for tasks assessed/performed Overall Cognitive Status: Within Functional Limits for tasks assessed                                        General Comments General comments (skin integrity, edema, etc.): initially on 4L O2 maintaining 99-100% O2 levels with activity. Weaned down to 2L with O2 reading 92 and HR up to 130s. once back in room placed on 3L and notified Rn    Exercises     Assessment/Plan     PT Assessment Patient needs continued PT services  PT Problem List Decreased activity tolerance;Decreased balance;Decreased mobility;Decreased knowledge of use of DME       PT Treatment Interventions DME instruction;Gait training;Functional mobility training;Therapeutic activities;Therapeutic exercise;Balance training;Patient/family education    PT Goals (Current goals can be found in the Care Plan section)  Acute Rehab PT Goals Patient Stated Goal: "home when I'm ready" PT Goal Formulation: With patient Time For Goal Achievement: 06/30/21 Potential to Achieve Goals: Good    Frequency Min 3X/week   Barriers to discharge        Co-evaluation               AM-PAC PT "6 Clicks" Mobility  Outcome Measure Help needed turning from your back to your side while in a flat bed without using bedrails?: None Help needed moving from lying on your back to sitting on the side of a flat bed without using bedrails?: A Little Help needed moving to and from a bed to a chair (including a wheelchair)?: A Little Help needed standing up from a chair using your arms (e.g., wheelchair or bedside chair)?: A Little Help needed to walk in hospital room?: A Little Help needed climbing 3-5 steps with a railing? : A Lot 6 Click Score: 18    End of Session Equipment Utilized During Treatment: Gait belt;Oxygen Activity Tolerance: Patient tolerated treatment well Patient left: in chair;with call bell/phone within reach;with family/visitor present Nurse Communication: Mobility status PT Visit Diagnosis: Unsteadiness on feet (R26.81);Difficulty in walking, not elsewhere classified (R26.2)    Time: 5784-6962 PT Time Calculation (min) (ACUTE ONLY): 28 min   Charges:   PT Evaluation $PT Eval Low Complexity: 1 Low          [place] Pager [phone] Office [phone]   Hurley Sobel 06/16/2021, 3:52 PM

## 2021-06-16 NOTE — Progress Notes (Signed)
PROGRESS NOTE    Victor [name]  UYQ:[id number] DOB: Oct 31, 1936 DOA: 06/13/2021 PCP: [name], MD   Chief Complaint  Patient presents with   Shortness of Breath   Brief Narrative: 85 year old man with history of AML followed by Dr. [name], hypothyroidism PE/DVT on Eliquis presented with dyspnea x1 week, was seen by his PCP.  He was prescribed Augmentin after starting the medication he felt increasing shortness of breath, reports cough.  He was seen in the ED chest x-ray-showed chronic but increased bilateral pulmonary interstitial opacity differential includes mild/developing interstitial edema versus viral/atypical respiratory infection.  Patient admitted for possible pneumonia/hypoxia Blood work showed neutropenia, thrombocytopenia, and anemia BNP 60.8 troponin 9->9, influenza and COVID-19 screen negative Patient is also followed by oncology placed on Neupogen due to neutropenia  Subjective: Still short of breath and feels terrible, no cough fever or chills,Potassium 3.3 this morning.   Trying ot have breakfast.Hemoglobin further downtrending. Total WBC count remains low 800 but overall afebrile T-max 99  Assessment & Plan:  Dyspnea on exertion, concern for pneumonia Acute hypoxic respiratory failure No PE on CTA chest but with patchy airspace opacities bilaterally suggestive of multifocal infiltrate, scattered small subpleural nodules.  BNP normal CHF ruled out.  We will continue on cefepime, doxycycline. Streptococcus pneumonia antigen negative, Legionella antigen pending, respiratory virus panel negative.  No sputum available for sputum culture yet. Continue antitussives, supplemental oxygen as needed nebs.  Blood transfusion today should help with shortness of breath as well. He is weaned down to 2 L nasal cannula continue supplemental oxygen to maintain at least 92% or above  Pancytopenia with leukopenia thrombocytopenia anemia: Appreciate hematology input. Cotn on  Neupogen wbc still low, hematology has ordered 2 units PRBC transfusion.  Will monitor. Continue on neutropenic precautions.  Diflucan added 8/11.Continue his home acyclovir Recent Labs  Lab 06/14/21 0536 06/15/21 0423 06/16/21 0310  HGB 9.2* 8.4* 8.1*  HCT 29.0* 26.5* 25.7*  WBC 0.8* 0.8* 0.8*  PLT 29* 26* 28*  AML in remission hematologically per hematology.   DVT/PE history: cont his Eliquis.  BPH/Hypertension Blood pressure was soft 8/11 92/53-discontinued his propanolol, decreased nightly Cardura to 1 mg.    HLD: on statin  Hypothyroidism: on Synthroid  Deconditioning/debility in the setting of multiple comorbidities.  Continue  PT OT .  Diet Order             Diet regular Room service appropriate? Yes; Fluid consistency: Thin  Diet effective now                   Patient's Body mass index is 29.26 kg/m. DVT prophylaxis: apixaban (ELIQUIS) tablet 2.5 mg Start: 06/13/21 2200 Code Status:   Code Status: Full Code  Family Communication: plan of care discussed with patient at bedside. Self updating wife who visits during daytime  Status is: Remains inpatient Remains hospitalized for ongoing management of dyspnea, pancytopenia Dispo: The patient is from: Home              Anticipated d/c is to: TBD continue PT OT              patient currently is not medically stable to d/c.   Difficult to place patient No  Unresulted Labs (From admission, onward)     Start     Ordered   06/16/21 0710  Prepare RBC (crossmatch)  (Adult Blood Administration - Red Blood Cells)  Once,   R       Question Answer Comment  #  of Units 2 units   Transfusion Indications Symptomatic Anemia   Number of Units to Keep Ahead NO units ahead   Instructions: Transfuse   If emergent release call blood bank Not emergent release      06/16/21 0709   06/16/21 0709  Type and screen [facility]  Once,   R       Comments: [place]    06/16/21 0709    06/15/21 0829  Expectorated Sputum Assessment w Gram Stain, Rflx to Resp Cult  Once,   R        06/15/21 0829   06/15/21 0500  CBC  Daily,   R     Question:  Specimen collection method  Answer:  IV Team=IV Team collect   06/14/21 0865   06/15/21 7846  Basic metabolic panel  Daily,   R     Question:  Specimen collection method  Answer:  IV Team=IV Team collect   06/14/21 9629   06/13/21 1807  Legionella Pneumophila Serogp 1 Ur Ag  (COPD / Pneumonia / Cellulitis / Lower Extremity Wound)  Once,   R        06/13/21 1810           Medications reviewed:  Scheduled Meds:  sodium chloride   Intravenous Once   acyclovir  400 mg Oral BID   apixaban  2.5 mg Oral BID   Chlorhexidine Gluconate Cloth  6 each Topical Daily   doxazosin  1 mg Oral QHS   doxycycline  100 mg Oral Q12H   filgrastim  480 mcg Subcutaneous Daily   fluconazole  100 mg Oral Q breakfast   furosemide  20 mg Intravenous Once   furosemide  20 mg Intravenous Once   guaiFENesin  600 mg Oral BID WC   ketoconazole  1 application Topical Once per day on Mon Thu   levothyroxine  150 mcg Oral Q0600   loratadine  10 mg Oral QHS   melatonin  5 mg Oral QHS   pantoprazole  40 mg Oral q1800   saccharomyces boulardii  250 mg Oral BID WC   simvastatin  40 mg Oral q1800   Continuous Infusions:  ceFEPime (MAXIPIME) IV 2 g (06/16/21 0601)   Consultants:see note  Procedures:see note Antimicrobials: Anti-infectives (From admission, onward)    Start     Dose/Rate Route Frequency Ordered Stop   06/15/21 1000  fluconazole (DIFLUCAN) tablet 100 mg  Status:  Discontinued        100 mg Oral Daily 06/15/21 0726 06/15/21 0915   06/15/21 1000  fluconazole (DIFLUCAN) tablet 100 mg        100 mg Oral Daily with breakfast 06/15/21 0915     06/14/21 1100  doxycycline (VIBRA-TABS) tablet 100 mg        100 mg Oral Every 12 hours 06/14/21 1001     06/14/21 1045  acyclovir (ZOVIRAX) tablet 400 mg        400 mg Oral 2 times daily 06/14/21 0950      06/13/21 2200  ceFEPIme (MAXIPIME) 2 g in sodium chloride 0.9 % 100 mL IVPB        2 g 200 mL/hr over 30 Minutes Intravenous Every 8 hours 06/13/21 1829     06/13/21 1345  ceFEPIme (MAXIPIME) 2 g in sodium chloride 0.9 % 100 mL IVPB        2 g 200 mL/hr over 30 Minutes Intravenous  Once 06/13/21 1342 06/13/21 1458  06/13/21 1300  cefTRIAXone (ROCEPHIN) 1 g in sodium chloride 0.9 % 100 mL IVPB  Status:  Discontinued        1 g 200 mL/hr over 30 Minutes Intravenous  Once 06/13/21 1259 06/13/21 1409   06/13/21 1300  azithromycin (ZITHROMAX) 500 mg in sodium chloride 0.9 % 250 mL IVPB  Status:  Discontinued        500 mg 250 mL/hr over 60 Minutes Intravenous  Once 06/13/21 1259 06/13/21 1342      Culture/Microbiology    Component Value Date/Time   SDES  03/15/2021 1149    URINE, RANDOM Performed at [facility], [address] [place], [place] [address]    [facility]  03/15/2021 1149    NONE Performed at [facility], [address] [place], [place] [address]    CULT MULTIPLE SPECIES PRESENT, SUGGEST RECOLLECTION (A) 03/15/2021 1149   REPTSTATUS 03/16/2021 FINAL 03/15/2021 1149    Other culture-see note  Objective: Vitals: Today's Vitals   06/15/21 1200 06/15/21 1631 06/15/21 1944 06/16/21 0555  BP:  (!) 134/57 (!) 122/55 (!) 144/52  Pulse:  99 (!) 107 (!) 105  Resp:  16 20 20   Temp:  98.7 F (37.1 C) 99.1 F (37.3 C) 98 F (36.7 C)  TempSrc:  Oral Oral Oral  SpO2:  95% 95% 97%  Weight:      Height:      PainSc: 0-No pain       Intake/Output Summary (Last 24 hours) at 06/16/2021 0846 Last data filed at 06/16/2021 0700 Gross per 24 hour  Intake --  Output 350 ml  Net -350 ml   Filed Weights   06/13/21 1940  Weight: 92.5 kg   Weight change:   Intake/Output from previous day: 08/11 0701 - 08/12 0700 In: -  Out: 350 [Urine:350] Intake/Output this shift: No intake/output data recorded. Filed Weights   06/13/21 1940  Weight: 92.5  kg   Examination:  General exam: AAOx 3, pleasant, elderly gentleman HEENT:Oral mucosa moist, Ear/Nose WNL grossly, dentition normal. Respiratory system: bilaterally clear, no use of accessory muscle Cardiovascular system: S1 & S2 +, No JVD,. Gastrointestinal system: Abdomen soft,NT,ND, BS+ Nervous System:Alert, awake, moving extremities and grossly nonfocal Extremities: no edema, distal peripheral pulses palpable.  Skin: No rashes,no icterus. MSK: Normal muscle bulk,tone, power   Data Reviewed: I have personally reviewed following labs and imaging studies CBC: Recent Labs  Lab 06/12/21 0945 06/13/21 1109 06/14/21 0536 06/15/21 0423 06/16/21 0310  WBC 1.6* 1.2* 0.8* 0.8* 0.8*  NEUTROABS 1.1* 0.7*  --   --   --   HGB 10.4* 10.1* 9.2* 8.4* 8.1*  HCT 32.1* 30.8* 29.0* 26.5* 25.7*  MCV 92.5 91.9 94.2 94.6 93.8  PLT 32* 33* 29* 26* 28*   Basic Metabolic Panel: Recent Labs  Lab 06/12/21 0945 06/13/21 1109 06/14/21 0536 06/15/21 0423 06/16/21 0310  NA 133* 134* 136 135 135  K 3.9 3.8 3.6 3.6 3.3*  CL 98 101 102 103 104  CO2 26 24 25 25 23   GLUCOSE 133* 132* 108* 116* 108*  BUN 11 11 10 13 13   CREATININE 1.00 0.86 0.91 0.86 0.82  CALCIUM 8.5* 7.9* 7.9* 7.6* 7.8*  MG 1.9  --   --   --   --    GFR: Estimated Creatinine Clearance: 78 mL/min (by C-G formula based on SCr of 0.82 mg/dL). Liver Function Tests: Recent Labs  Lab 06/12/21 0945 06/13/21 1109 06/14/21 0536  AST  9* 13* 10*  ALT 11 12 13   ALKPHOS 62 57 53  BILITOT 1.2 0.8 1.0  PROT 5.5* 5.5* 5.2*  ALBUMIN 3.3* 2.8* 2.5*   No results for input(s): LIPASE, AMYLASE in the last 168 hours. No results for input(s): AMMONIA in the last 168 hours. Coagulation Profile: No results for input(s): INR, PROTIME in the last 168 hours. Cardiac Enzymes: No results for input(s): CKTOTAL, CKMB, CKMBINDEX, TROPONINI in the last 168 hours. BNP (last 3 results) No results for input(s): PROBNP in the last 8760  hours. HbA1C: No results for input(s): HGBA1C in the last 72 hours. CBG: No results for input(s): GLUCAP in the last 168 hours. Lipid Profile: No results for input(s): CHOL, HDL, LDLCALC, TRIG, CHOLHDL, LDLDIRECT in the last 72 hours. Thyroid Function Tests: No results for input(s): TSH, T4TOTAL, FREET4, T3FREE, THYROIDAB in the last 72 hours. Anemia Panel: No results for input(s): VITAMINB12, FOLATE, FERRITIN, TIBC, IRON, RETICCTPCT in the last 72 hours. Sepsis Labs: No results for input(s): PROCALCITON, LATICACIDVEN in the last 168 hours.  Recent Results (from the past 240 hour(s))  Resp Panel by RT-PCR (Flu A&B, Covid) Nasopharyngeal Swab     Status: None   Collection Time: 06/13/21 11:09 AM   Specimen: Nasopharyngeal Swab; Nasopharyngeal(NP) swabs in vial transport medium  Result Value Ref Range Status   SARS Coronavirus 2 by RT PCR NEGATIVE NEGATIVE Final    Comment: (NOTE) SARS-CoV-2 target nucleic acids are NOT DETECTED.  The SARS-CoV-2 RNA is generally detectable in upper respiratory specimens during the acute phase of infection. The lowest concentration of SARS-CoV-2 viral copies this assay can detect is 138 copies/mL. A negative result does not preclude SARS-Cov-2 infection and should not be used as the sole basis for treatment or other patient management decisions. A negative result may occur with  improper specimen collection/handling, submission of specimen other than nasopharyngeal swab, presence of viral mutation(s) within the areas targeted by this assay, and inadequate number of viral copies(<138 copies/mL). A negative result must be combined with clinical observations, patient history, and epidemiological information. The expected result is Negative.  Fact Sheet for Patients:  EntrepreneurPulse.com.au  Fact Sheet for Healthcare Providers:  IncredibleEmployment.be  This test is no t yet approved or cleared by the [place] FDA and  has been authorized for detection and/or diagnosis of SARS-CoV-2 by FDA under an Emergency Use Authorization (EUA). This EUA will remain  in effect (meaning this test can be used) for the duration of the COVID-19 declaration under Section 564(b)(1) of the Act, 21 U.S.C.section 360bbb-3(b)(1), unless the authorization is terminated  or revoked sooner.       Influenza A by PCR NEGATIVE NEGATIVE Final   Influenza B by PCR NEGATIVE NEGATIVE Final    Comment: (NOTE) The Xpert Xpress SARS-CoV-2/FLU/RSV plus assay is intended as an aid in the diagnosis of influenza from Nasopharyngeal swab specimens and should not be used as a sole basis for treatment. Nasal washings and aspirates are unacceptable for Xpert Xpress SARS-CoV-2/FLU/RSV testing.  Fact Sheet for Patients: EntrepreneurPulse.com.au  Fact Sheet for Healthcare Providers: IncredibleEmployment.be  This test is not yet approved or cleared by the [place] FDA and has been authorized for detection and/or diagnosis of SARS-CoV-2 by FDA under an Emergency Use Authorization (EUA). This EUA will remain in effect (meaning this test can be used) for the duration of the COVID-19 declaration under Section 564(b)(1) of the Act, 21 U.S.C. section 360bbb-3(b)(1), unless the authorization is terminated or revoked.  Performed  at [facility], [address] [place], [place] [address]   Respiratory (~20 pathogens) panel by PCR     Status: None   Collection Time: 06/13/21  6:08 PM   Specimen: Nasopharyngeal Swab; Respiratory  Result Value Ref Range Status   Adenovirus NOT DETECTED NOT DETECTED Final   Coronavirus 229E NOT DETECTED NOT DETECTED Final    Comment: (NOTE) The Coronavirus on the Respiratory Panel, DOES NOT test for the novel  Coronavirus (2019 nCoV)    Coronavirus HKU1 NOT DETECTED NOT DETECTED Final   Coronavirus NL63 NOT DETECTED NOT DETECTED Final    Coronavirus OC43 NOT DETECTED NOT DETECTED Final   Metapneumovirus NOT DETECTED NOT DETECTED Final   Rhinovirus / Enterovirus NOT DETECTED NOT DETECTED Final   Influenza A NOT DETECTED NOT DETECTED Final   Influenza B NOT DETECTED NOT DETECTED Final   Parainfluenza Virus 1 NOT DETECTED NOT DETECTED Final   Parainfluenza Virus 2 NOT DETECTED NOT DETECTED Final   Parainfluenza Virus 3 NOT DETECTED NOT DETECTED Final   Parainfluenza Virus 4 NOT DETECTED NOT DETECTED Final   Respiratory Syncytial Virus NOT DETECTED NOT DETECTED Final   Bordetella pertussis NOT DETECTED NOT DETECTED Final   Bordetella Parapertussis NOT DETECTED NOT DETECTED Final   Chlamydophila pneumoniae NOT DETECTED NOT DETECTED Final   Mycoplasma pneumoniae NOT DETECTED NOT DETECTED Final    Comment: Performed at [facility], [place]. [address] [place], [place] [address]     Radiology Studies: No results found.   LOS: 2 days   Antonieta Pert, MD [facility]  06/16/2021, 8:46 AM

## 2021-06-16 NOTE — Progress Notes (Signed)
Occupational therapy Evaluation  Patient lives with spouse in a single level home, no steps to enter. Independent at baseline, does not use AD but has rollator, walker and cane at home to use as needed. Overall patient did not require physical assistance for lower body dressing, sit to stand from edge of bed. Initial unsteadiness ambulating without AD, however with walker did very well with no loss of balance. Initially on 4L O2 maintaining 100% with activity, weaned down to 2L maintaining 92% with HR increasing to 130s, notified RN and patient placed on 3L once back in room. No further acute OT needs, encourage out of bed to chair throughout the day and continued ambulation with staff. OT to sign off.     06/16/21 1400  OT Visit Information  Last OT Received On 06/16/21  Assistance Needed +1  History of Present Illness Patient is an 85 year old male presenting to PCP with dyspnea x1 week. Blood work showing neutropenia, thrombocytopenia, and anemia. PMH includes AML, hypothyroidism PE/DVT on Eliquis.  Precautions  Precaution Comments monitor O2/HR  Restrictions  Weight Bearing Restrictions No  Home Living  Family/patient expects to be discharged to: Private residence  Living Arrangements Spouse/significant other  Available Help at Discharge Family  Type of [place] Access Level entry  [employer] One level  Bathroom Shower/Tub Walk-in shower;Tub/shower unit  [place] - 2 wheels;Walker - 4 wheels;Cane - single point;Shower seat;Grab bars - tub/shower  Prior Function  Level of Independence Independent  Communication  Communication No difficulties  Pain Assessment  Pain Assessment Faces  Faces Pain Scale 0  Cognition  Arousal/Alertness Awake/alert  Behavior During Therapy WFL for tasks assessed/performed  Overall Cognitive Status Within Functional Limits for tasks assessed  Upper Extremity Assessment  Upper Extremity Assessment Overall WFL for tasks assessed   Lower Extremity Assessment  Lower Extremity Assessment Defer to PT evaluation  Cervical / Trunk Assessment  Cervical / Trunk Assessment Normal  ADL  Overall ADL's  At baseline  General ADL Comments patient able to doff/Victor [name] socks seated edge of bed, reports standing at bedside to use urinal without needing assistance. Initially unsteady with ambulation without AD however did well with rolling walker. Has one at home to use as needed.  Bed Mobility  General bed mobility comments seated EOB upon arrival  Transfers  Overall transfer level Modified independent  Equipment used None  General transfer comment sit to stand from edge of bed without assistance. Tried ambulating without AD, mildly unsteady. Much improved with rolling walker not needing physical assistance and no loss of balance noted  Balance  Overall balance assessment Mild deficits observed, not formally tested  General Comments  General comments (skin integrity, edema, etc.) initially on 4L O2 maintaining 99-100% O2 levels with activity. Weaned down to 2L with O2 reading 92 and HR up to 130s. once back in room placed on 3L and notified Rn  OT - End of Session  Equipment Utilized During Treatment Gait belt;Rolling walker;Oxygen  Activity Tolerance Patient tolerated treatment well  Patient left in chair;with call bell/phone within reach;with family/visitor present  Nurse Communication Mobility status;Other (comment) (O2)  OT Assessment  OT Recommendation/Assessment Patient does not need any further OT services  OT Visit Diagnosis Other abnormalities of gait and mobility (R26.89)  OT Problem List Decreased activity tolerance;Impaired balance (sitting and/or standing);Cardiopulmonary status limiting activity  AM-PAC OT "6 Clicks" Daily Activity Outcome Measure (Version 2)  Help from another person eating meals? 4  Help  from another person taking care of personal grooming? 4  Help from another person toileting, which includes using  toliet, bedpan, or urinal? 4  Help from another person bathing (including washing, rinsing, drying)? 4  Help from another person to put on and taking off regular upper body clothing? 4  Help from another person to put on and taking off regular lower body clothing? 4  6 Click Score 24  Progressive Mobility  What is the highest level of mobility based on the progressive mobility assessment? Level 6 (Walks independently in room and hall) - Balance while walking in room without assist - Complete  Mobility Ambulated independently in room;Out of bed to chair with meals  OT Recommendation  Follow Up Recommendations No OT follow up  OT Equipment None recommended by OT  Acute Rehab OT Goals  Patient Stated Goal "home when I'm ready"  OT Goal Formulation All assessment and education complete, DC therapy  OT Time Calculation  OT Start Time (ACUTE ONLY) 1430  OT Stop Time (ACUTE ONLY) 1453  OT Time Calculation (min) 23 min  OT General Charges  $OT Visit 1 Visit  OT Evaluation  $OT Eval Low Complexity 1 Low  Written Expression  Dominant Hand  (did not specify)   Victor [name] OT OT pager: [phone]

## 2021-06-16 NOTE — Progress Notes (Signed)
Mr. Auld is still short of breath.  I think this has to do with his anemia getting worse.  His hemoglobin is 8.1.  He really needs to be transfused from my point of view.  I would go ahead and get him 2 units of blood.  His platelet count is 28,000.  His white cell count is still 800.  He is on Neupogen.  So far, cultures are negative.  His appetite is down.  He has had no nausea or vomiting.  Diarrhea does not seem to be as much of a problem.  There is no bleeding.  He has had no rashes.  His vital signs are temperature 98.  Pulse 105.  Blood pressure 144/52.  Oxygen saturation is 97%.  His lungs sound clear bilaterally.  He has no wheezes.  He has good air movement bilaterally.  Cardiac exam regular rate and rhythm.  Abdomen is soft.  There is no guarding or rebound tenderness.  Extremity shows no clubbing, cyanosis or edema.  I would do hope and believe that the transfusion will help him feel better.  It would be nice if his white cell count will come up a little bit.  Given the fact that he had chemotherapy last week, this is probably limiting his white cell count from improving.  At least, his white cell count has not going down.  It would be nice to try to get him home.  However, I suspect that he probably will be here through the weekend.  I do appreciate the wonderful care that he is getting from the hard-working staff on 5 E.  Lattie Haw, MD  Psalm 34:8

## 2021-06-16 NOTE — Progress Notes (Signed)
A 12 lead EKG was performed.  Patient has tachycardia.

## 2021-06-17 DIAGNOSIS — R0602 Shortness of breath: Secondary | ICD-10-CM | POA: Diagnosis not present

## 2021-06-17 DIAGNOSIS — D709 Neutropenia, unspecified: Secondary | ICD-10-CM | POA: Diagnosis not present

## 2021-06-17 DIAGNOSIS — C9201 Acute myeloblastic leukemia, in remission: Secondary | ICD-10-CM | POA: Diagnosis not present

## 2021-06-17 LAB — TYPE AND SCREEN
ABO/RH(D): A POS
Antibody Screen: NEGATIVE
Unit division: 0
Unit division: 0

## 2021-06-17 LAB — CBC WITH DIFFERENTIAL/PLATELET
Abs Immature Granulocytes: 0.07 10*3/uL (ref 0.00–0.07)
Basophils Absolute: 0 10*3/uL (ref 0.0–0.1)
Basophils Relative: 2 %
Eosinophils Absolute: 0 10*3/uL (ref 0.0–0.5)
Eosinophils Relative: 0 %
HCT: 30.8 % — ABNORMAL LOW (ref 39.0–52.0)
Hemoglobin: 9.9 g/dL — ABNORMAL LOW (ref 13.0–17.0)
Immature Granulocytes: 5 %
Lymphocytes Relative: 29 %
Lymphs Abs: 0.4 10*3/uL — ABNORMAL LOW (ref 0.7–4.0)
MCH: 29 pg (ref 26.0–34.0)
MCHC: 32.1 g/dL (ref 30.0–36.0)
MCV: 90.3 fL (ref 80.0–100.0)
Monocytes Absolute: 0.3 10*3/uL (ref 0.1–1.0)
Monocytes Relative: 19 %
Neutro Abs: 0.6 10*3/uL — ABNORMAL LOW (ref 1.7–7.7)
Neutrophils Relative %: 45 %
Platelets: 34 10*3/uL — ABNORMAL LOW (ref 150–400)
RBC: 3.41 MIL/uL — ABNORMAL LOW (ref 4.22–5.81)
RDW: 18.2 % — ABNORMAL HIGH (ref 11.5–15.5)
WBC: 1.4 10*3/uL — CL (ref 4.0–10.5)
nRBC: 1.5 % — ABNORMAL HIGH (ref 0.0–0.2)

## 2021-06-17 LAB — TROPONIN I (HIGH SENSITIVITY)
Troponin I (High Sensitivity): 6 ng/L (ref <18)
Troponin I (High Sensitivity): 7 ng/L (ref <18)

## 2021-06-17 LAB — BPAM RBC
Blood Product Expiration Date: 202209052359
Blood Product Expiration Date: 202209062359
ISSUE DATE / TIME: 202208121208
ISSUE DATE / TIME: 202208121532
Unit Type and Rh: 6200
Unit Type and Rh: 6200

## 2021-06-17 LAB — COMPREHENSIVE METABOLIC PANEL
ALT: 12 U/L (ref 0–44)
AST: 10 U/L — ABNORMAL LOW (ref 15–41)
Albumin: 2.4 g/dL — ABNORMAL LOW (ref 3.5–5.0)
Alkaline Phosphatase: 55 U/L (ref 38–126)
Anion gap: 10 (ref 5–15)
BUN: 15 mg/dL (ref 8–23)
CO2: 27 mmol/L (ref 22–32)
Calcium: 8 mg/dL — ABNORMAL LOW (ref 8.9–10.3)
Chloride: 103 mmol/L (ref 98–111)
Creatinine, Ser: 0.97 mg/dL (ref 0.61–1.24)
GFR, Estimated: >60 mL/min (ref >60)
Glucose, Bld: 104 mg/dL — ABNORMAL HIGH (ref 70–99)
Potassium: 3.1 mmol/L — ABNORMAL LOW (ref 3.5–5.1)
Sodium: 140 mmol/L (ref 135–145)
Total Bilirubin: 1.3 mg/dL — ABNORMAL HIGH (ref 0.3–1.2)
Total Protein: 5.2 g/dL — ABNORMAL LOW (ref 6.5–8.1)

## 2021-06-17 MED ORDER — POTASSIUM CHLORIDE CRYS ER 10 MEQ PO TBCR
40.0000 meq | EXTENDED_RELEASE_TABLET | Freq: Once | ORAL | Status: AC
  Administered 2021-06-17: 40 meq via ORAL
  Filled 2021-06-17: qty 4

## 2021-06-17 MED ORDER — POTASSIUM CHLORIDE 10 MEQ/50ML IV SOLN
10.0000 meq | INTRAVENOUS | Status: AC
  Administered 2021-06-17 (×4): 10 meq via INTRAVENOUS
  Filled 2021-06-17 (×5): qty 50

## 2021-06-17 NOTE — Plan of Care (Signed)
  Problem: Elimination: Goal: Will not experience complications related to bowel motility Outcome: Progressing Goal: Will not experience complications related to urinary retention Outcome: Progressing   Problem: Safety: Goal: Ability to remain free from injury will improve Outcome: Progressing   Problem: Skin Integrity: Goal: Risk for impaired skin integrity will decrease Outcome: Progressing   

## 2021-06-17 NOTE — Progress Notes (Signed)
Finally, his white cell count is starting to come up.  His white cell count is 1.4.  Platelet count is 34,000.  Hemoglobin is 9.9.  He did get 2 units of blood yesterday.  He does feel little bit better.  He does not feel as short of breath.  I think we can probably stop the Maxipime right now.  Cultures were all negative to date.  His diarrhea is doing better.  He is on [employer].  He has had no bleeding.  He has had some back spasms.  His labs show sodium 140.  Potassium 3.1.  BUN 15 creatinine 0.97.  His albumin is 2.4.  His vital signs are temperature of 98.6.  Pulse 101.  Blood pressure 140/57.  His oral exam shows no mucositis.  His lungs sound clear bilaterally.  Cardiac exam regular rate and rhythm.  Abdomen is soft.  Bowel sounds are present.  There is no guarding or rebound tenderness.  Extremities does show some mild swelling in the legs.  Skin exam shows some scattered ecchymoses.  Neurological exam is nonfocal.  I am happy to see that the white cell count is coming up.  I would still keep him on Neupogen.  Hopefully, his hemoglobin will continue to trend upward after the transfusion.  I think he probably needs to have some potassium.  I did give him IV potassium.  I still have to believe that he will be in the hospital over the weekend.  I do appreciate the great care that he is getting from the staff on 5 E.  Lattie Haw, MD  [place] [phone]

## 2021-06-17 NOTE — Plan of Care (Signed)
  Problem: Clinical Measurements: Goal: Respiratory complications will improve Outcome: Completed/Met

## 2021-06-17 NOTE — Progress Notes (Signed)
PROGRESS NOTE    Victor [name]  OYD:[id number] DOB: 1936-09-08 DOA: 06/13/2021 PCP: [name], MD    Brief Narrative: This 85 year old man with history of AML followed by Dr. [name], hypothyroidism, PE/DVT on Eliquis presented with dyspnea x 1 week, was seen by his PCP.  He was prescribed Augmentin for possible pneumonia, after starting the medication he felt increasing shortness of breath, reports cough.  He was seen in the ED chest x-ray-showed chronic but increased bilateral pulmonary interstitial opacity,  differential includes mild/developing interstitial edema versus viral/atypical respiratory infection.  Patient admitted for possible pneumonia/hypoxia. Blood work showed neutropenia, thrombocytopenia, and anemia,  BNP 60.8 troponin 9->9, influenza and COVID-19 screen negative. Patient is also followed by oncology,  placed on Neupogen due to neutropenia    Assessment & Plan:   Active Problems:   PNA (pneumonia)   Pneumonia   Acute hypoxic respiratory failure likely secondary to suspected pneumonia. No PE on CTA chest but with patchy airspace opacities bilaterally suggestive of multifocal infiltrate, scattered small subpleural nodules.  BNP normal,  CHF ruled out.  Continued on cefepime and doxycycline. Streptococcus pneumonia antigen negative, Legionella antigen pnegative, respiratory virus panel negative.  No sputum available for sputum culture yet. Continue antitussives, supplemental oxygen as needed nebs.  Cefepime discontinued.  Blood transfusion on 8/12  helped  shortness of breath as well. He is weaned down to 2 L nasal cannula,  continue supplemental oxygen to maintain at least 92% or above.   Pancytopenia with leukopenia thrombocytopenia anemia:  Appreciate hematology input. Continue on Neupogen,  wbc still low, hematology has ordered 2 units PRBC transfusion.  Will monitor. Continue on neutropenic precautions.  Diflucan added 8/11.Continue his home  acyclovir.  AML in remission hematologically per hematology.    DVT/PE history: Continue Eliquis.   BPH/Hypertension Blood pressure was soft 8/11 92/53-discontinued his propanolol, decreased nightly Cardura to 1 mg.     HLD: Continue statins   Hypothyroidism:  Continue Synthroid   Deconditioning/debility in the setting of multiple comorbidities.  Continue  PT/ OT    DVT prophylaxis: Eliquis Code Status: Full code. Family Communication:  No family at bed side. Disposition Plan:    Status is: Inpatient  Remains inpatient appropriate because:Inpatient level of care appropriate due to severity of illness  Dispo: The patient is from: Home              Anticipated d/c is to: Home              Patient currently is not medically stable to d/c.   Difficult to place patient No  Consultants:  Heme/Oncology  Procedures:   Antimicrobials:   Anti-infectives (From admission, onward)    Start     Dose/Rate Route Frequency Ordered Stop   06/15/21 1000  fluconazole (DIFLUCAN) tablet 100 mg  Status:  Discontinued        100 mg Oral Daily 06/15/21 0726 06/15/21 0915   06/15/21 1000  fluconazole (DIFLUCAN) tablet 100 mg        100 mg Oral Daily with breakfast 06/15/21 0915     06/14/21 1100  doxycycline (VIBRA-TABS) tablet 100 mg        100 mg Oral Every 12 hours 06/14/21 1001     06/14/21 1045  acyclovir (ZOVIRAX) tablet 400 mg        400 mg Oral 2 times daily 06/14/21 0950     06/13/21 2200  ceFEPIme (MAXIPIME) 2 g in sodium chloride 0.9 % 100 mL IVPB  Status:  Discontinued        2 g 200 mL/hr over 30 Minutes Intravenous Every 8 hours 06/13/21 1829 06/17/21 0739   06/13/21 1345  ceFEPIme (MAXIPIME) 2 g in sodium chloride 0.9 % 100 mL IVPB        2 g 200 mL/hr over 30 Minutes Intravenous  Once 06/13/21 1342 06/13/21 1458   06/13/21 1300  cefTRIAXone (ROCEPHIN) 1 g in sodium chloride 0.9 % 100 mL IVPB  Status:  Discontinued        1 g 200 mL/hr over 30 Minutes Intravenous  Once  06/13/21 1259 06/13/21 1409   06/13/21 1300  azithromycin (ZITHROMAX) 500 mg in sodium chloride 0.9 % 250 mL IVPB  Status:  Discontinued        500 mg 250 mL/hr over 60 Minutes Intravenous  Once 06/13/21 1259 06/13/21 1342        Subjective: Patient was seen and examined at bedside.  Overnight events noted.  He reports feeling better, he still feels exhausted but denies any chest pain or shortness of breath.  Wife is at bedside,  denies any other concerns  Objective: Vitals:   06/16/21 1602 06/16/21 2019 06/17/21 0633 06/17/21 1338  BP: (!) 122/56 (!) 158/62 (!) 140/57 (!) 151/67  Pulse: (!) 108 (!) 110 (!) 101 (!) 102  Resp: 18 20 18 18   Temp: 99.2 F (37.3 C) 99.7 F (37.6 C) 98.6 F (37 C) (!) 97.5 F (36.4 C)  TempSrc: Oral Oral Oral Oral  SpO2: 93% 93% 93% 93%  Weight:      Height:        Intake/Output Summary (Last 24 hours) at 06/17/2021 1420 Last data filed at 06/17/2021 0945 Gross per 24 hour  Intake 2437.86 ml  Output 675 ml  Net 1762.86 ml   Filed Weights   06/13/21 1940  Weight: 92.5 kg    Examination:  General exam: Lying comfortably, denies any concerns, not in any acute distress. Respiratory system: Clear to auscultation, respiratory effort normal, RR 15 Cardiovascular system: S1 & S2 heard, RRR. No JVD, murmurs, rubs, gallops or clicks. No pedal edema. Gastrointestinal system: Abdomen is nondistended, soft and nontender. No organomegaly or masses felt. Normal bowel sounds heard. Central nervous system: Alert and oriented. No focal neurological deficits. Extremities: No edema, no cyanosis, no clubbing. Skin: No rashes, lesions or ulcers Psychiatry: Judgement and insight appear normal. Mood & affect appropriate.     Data Reviewed: I have personally reviewed following labs and imaging studies  CBC: Recent Labs  Lab 06/12/21 0945 06/13/21 1109 06/14/21 0536 06/15/21 0423 06/16/21 0310 06/17/21 0300  WBC 1.6* 1.2* 0.8* 0.8* 0.8* 1.4*   NEUTROABS 1.1* 0.7*  --   --   --  0.6*  HGB 10.4* 10.1* 9.2* 8.4* 8.1* 9.9*  HCT 32.1* 30.8* 29.0* 26.5* 25.7* 30.8*  MCV 92.5 91.9 94.2 94.6 93.8 90.3  PLT 32* 33* 29* 26* 28* 34*   Basic Metabolic Panel: Recent Labs  Lab 06/12/21 0945 06/13/21 1109 06/14/21 0536 06/15/21 0423 06/16/21 0310 06/17/21 0300  NA 133* 134* 136 135 135 140  K 3.9 3.8 3.6 3.6 3.3* 3.1*  CL 98 101 102 103 104 103  CO2 26 24 25 25 23 27   GLUCOSE 133* 132* 108* 116* 108* 104*  BUN 11 11 10 13 13 15   CREATININE 1.00 0.86 0.91 0.86 0.82 0.97  CALCIUM 8.5* 7.9* 7.9* 7.6* 7.8* 8.0*  MG 1.9  --   --   --   --   --  GFR: Estimated Creatinine Clearance: 65.9 mL/min (by C-G formula based on SCr of 0.97 mg/dL). Liver Function Tests: Recent Labs  Lab 06/12/21 0945 06/13/21 1109 06/14/21 0536 06/17/21 0300  AST 9* 13* 10* 10*  ALT 11 12 13 12   ALKPHOS 62 57 53 55  BILITOT 1.2 0.8 1.0 1.3*  PROT 5.5* 5.5* 5.2* 5.2*  ALBUMIN 3.3* 2.8* 2.5* 2.4*   No results for input(s): LIPASE, AMYLASE in the last 168 hours. No results for input(s): AMMONIA in the last 168 hours. Coagulation Profile: No results for input(s): INR, PROTIME in the last 168 hours. Cardiac Enzymes: No results for input(s): CKTOTAL, CKMB, CKMBINDEX, TROPONINI in the last 168 hours. BNP (last 3 results) No results for input(s): PROBNP in the last 8760 hours. HbA1C: No results for input(s): HGBA1C in the last 72 hours. CBG: No results for input(s): GLUCAP in the last 168 hours. Lipid Profile: No results for input(s): CHOL, HDL, LDLCALC, TRIG, CHOLHDL, LDLDIRECT in the last 72 hours. Thyroid Function Tests: No results for input(s): TSH, T4TOTAL, FREET4, T3FREE, THYROIDAB in the last 72 hours. Anemia Panel: No results for input(s): VITAMINB12, FOLATE, FERRITIN, TIBC, IRON, RETICCTPCT in the last 72 hours. Sepsis Labs: No results for input(s): PROCALCITON, LATICACIDVEN in the last 168 hours.  Recent Results (from the past 240  hour(s))  Resp Panel by RT-PCR (Flu A&B, Covid) Nasopharyngeal Swab     Status: None   Collection Time: 06/13/21 11:09 AM   Specimen: Nasopharyngeal Swab; Nasopharyngeal(NP) swabs in vial transport medium  Result Value Ref Range Status   SARS Coronavirus 2 by RT PCR NEGATIVE NEGATIVE Final    Comment: (NOTE) SARS-CoV-2 target nucleic acids are NOT DETECTED.  The SARS-CoV-2 RNA is generally detectable in upper respiratory specimens during the acute phase of infection. The lowest concentration of SARS-CoV-2 viral copies this assay can detect is 138 copies/mL. A negative result does not preclude SARS-Cov-2 infection and should not be used as the sole basis for treatment or other patient management decisions. A negative result may occur with  improper specimen collection/handling, submission of specimen other than nasopharyngeal swab, presence of viral mutation(s) within the areas targeted by this assay, and inadequate number of viral copies(<138 copies/mL). A negative result must be combined with clinical observations, patient history, and epidemiological information. The expected result is Negative.  Fact Sheet for Patients:  EntrepreneurPulse.com.au  Fact Sheet for Healthcare Providers:  IncredibleEmployment.be  This test is no t yet approved or cleared by the [place] FDA and  has been authorized for detection and/or diagnosis of SARS-CoV-2 by FDA under an Emergency Use Authorization (EUA). This EUA will remain  in effect (meaning this test can be used) for the duration of the COVID-19 declaration under Section 564(b)(1) of the Act, 21 U.S.C.section 360bbb-3(b)(1), unless the authorization is terminated  or revoked sooner.       Influenza A by PCR NEGATIVE NEGATIVE Final   Influenza B by PCR NEGATIVE NEGATIVE Final    Comment: (NOTE) The Xpert Xpress SARS-CoV-2/FLU/RSV plus assay is intended as an aid in the diagnosis of influenza from  Nasopharyngeal swab specimens and should not be used as a sole basis for treatment. Nasal washings and aspirates are unacceptable for Xpert Xpress SARS-CoV-2/FLU/RSV testing.  Fact Sheet for Patients: EntrepreneurPulse.com.au  Fact Sheet for Healthcare Providers: IncredibleEmployment.be  This test is not yet approved or cleared by the [place] FDA and has been authorized for detection and/or diagnosis of SARS-CoV-2 by FDA under an Emergency Use Authorization (EUA).  This EUA will remain in effect (meaning this test can be used) for the duration of the COVID-19 declaration under Section 564(b)(1) of the Act, 21 U.S.C. section 360bbb-3(b)(1), unless the authorization is terminated or revoked.  Performed at [facility], [address] [place], [place] [address]   Respiratory (~20 pathogens) panel by PCR     Status: None   Collection Time: 06/13/21  6:08 PM   Specimen: Nasopharyngeal Swab; Respiratory  Result Value Ref Range Status   Adenovirus NOT DETECTED NOT DETECTED Final   Coronavirus 229E NOT DETECTED NOT DETECTED Final    Comment: (NOTE) The Coronavirus on the Respiratory Panel, DOES NOT test for the novel  Coronavirus (2019 nCoV)    Coronavirus HKU1 NOT DETECTED NOT DETECTED Final   Coronavirus NL63 NOT DETECTED NOT DETECTED Final   Coronavirus OC43 NOT DETECTED NOT DETECTED Final   Metapneumovirus NOT DETECTED NOT DETECTED Final   Rhinovirus / Enterovirus NOT DETECTED NOT DETECTED Final   Influenza A NOT DETECTED NOT DETECTED Final   Influenza B NOT DETECTED NOT DETECTED Final   Parainfluenza Virus 1 NOT DETECTED NOT DETECTED Final   Parainfluenza Virus 2 NOT DETECTED NOT DETECTED Final   Parainfluenza Virus 3 NOT DETECTED NOT DETECTED Final   Parainfluenza Virus 4 NOT DETECTED NOT DETECTED Final   Respiratory Syncytial Virus NOT DETECTED NOT DETECTED Final   Bordetella pertussis NOT DETECTED NOT DETECTED Final    Bordetella Parapertussis NOT DETECTED NOT DETECTED Final   Chlamydophila pneumoniae NOT DETECTED NOT DETECTED Final   Mycoplasma pneumoniae NOT DETECTED NOT DETECTED Final    Comment: Performed at [facility], [place]. [address], [place], [place] [address]   Radiology Studies: No results found.   Scheduled Meds:  acyclovir  400 mg Oral BID   apixaban  2.5 mg Oral BID   Chlorhexidine Gluconate Cloth  6 each Topical Daily   doxazosin  1 mg Oral QHS   doxycycline  100 mg Oral Q12H   filgrastim  480 mcg Subcutaneous Daily   fluconazole  100 mg Oral Q breakfast   guaiFENesin  600 mg Oral BID WC   ketoconazole  1 application Topical Once per day on Mon Thu   levothyroxine  150 mcg Oral Q0600   loratadine  10 mg Oral QHS   melatonin  5 mg Oral QHS   pantoprazole  40 mg Oral q1800   saccharomyces boulardii  250 mg Oral BID WC   simvastatin  40 mg Oral q1800   Continuous Infusions:  potassium chloride 10 mEq (06/17/21 1323)     LOS: 3 days    Time spent: 35 mins    Evalyse Stroope, MD [facility]   If 7PM-7AM, please contact night-coverage

## 2021-06-17 NOTE — Progress Notes (Signed)
PCP ordered labs. Will follow up with PCP if results are not within normal limits.

## 2021-06-17 NOTE — Progress Notes (Signed)
CMT notified the RN that the EKG showed ST elevation.  Notified PCP on call and will do 12 lead EKG.

## 2021-06-18 LAB — CBC
HCT: 32 % — ABNORMAL LOW (ref 39.0–52.0)
Hemoglobin: 10.2 g/dL — ABNORMAL LOW (ref 13.0–17.0)
MCH: 29.3 pg (ref 26.0–34.0)
MCHC: 31.9 g/dL (ref 30.0–36.0)
MCV: 92 fL (ref 80.0–100.0)
Platelets: 39 10*3/uL — ABNORMAL LOW (ref 150–400)
RBC: 3.48 MIL/uL — ABNORMAL LOW (ref 4.22–5.81)
RDW: 17.8 % — ABNORMAL HIGH (ref 11.5–15.5)
WBC: 2.3 10*3/uL — ABNORMAL LOW (ref 4.0–10.5)
nRBC: 0 % (ref 0.0–0.2)

## 2021-06-18 LAB — LEGIONELLA PNEUMOPHILA SEROGP 1 UR AG: L. pneumophila Serogp 1 Ur Ag: NEGATIVE

## 2021-06-18 LAB — BASIC METABOLIC PANEL
Anion gap: 7 (ref 5–15)
BUN: 12 mg/dL (ref 8–23)
CO2: 27 mmol/L (ref 22–32)
Calcium: 8.2 mg/dL — ABNORMAL LOW (ref 8.9–10.3)
Chloride: 101 mmol/L (ref 98–111)
Creatinine, Ser: 0.89 mg/dL (ref 0.61–1.24)
GFR, Estimated: >60 mL/min (ref >60)
Glucose, Bld: 100 mg/dL — ABNORMAL HIGH (ref 70–99)
Potassium: 3.6 mmol/L (ref 3.5–5.1)
Sodium: 135 mmol/L (ref 135–145)

## 2021-06-18 LAB — PHOSPHORUS: Phosphorus: 3 mg/dL (ref 2.5–4.6)

## 2021-06-18 LAB — MAGNESIUM: Magnesium: 1.9 mg/dL (ref 1.7–2.4)

## 2021-06-18 NOTE — Plan of Care (Signed)
  Problem: Activity: Goal: Risk for activity intolerance will decrease Outcome: Progressing   Problem: Nutrition: Goal: Adequate nutrition will be maintained Outcome: Progressing   Problem: Coping: Goal: Level of anxiety will decrease Outcome: Progressing   Problem: Elimination: Goal: Will not experience complications related to urinary retention Outcome: Progressing   Problem: Safety: Goal: Ability to remain free from injury will improve Outcome: Progressing   

## 2021-06-18 NOTE — Progress Notes (Signed)
PROGRESS NOTE    Victor [name]  EXB:[id number] DOB: 1935/11/17 DOA: 06/13/2021 PCP: [name], MD    Brief Narrative: This 85 year old man with history of AML followed by Dr. [name], hypothyroidism, PE/DVT on Eliquis presented with dyspnea x 1 week, was seen by his PCP.  He was prescribed Augmentin for possible pneumonia, after starting the medication he felt increasing shortness of breath, reports cough.  He was seen in the ED chest x-ray-showed chronic but increased bilateral pulmonary interstitial opacities,  differential includes mild/developing interstitial edema versus viral/atypical respiratory infection.  Patient admitted for possible pneumonia/hypoxia. Blood work showed neutropenia, thrombocytopenia, and anemia,  BNP 60.8 troponin 9->9, influenza and COVID-19 screen negative. Patient is also followed by oncology,  placed on Neupogen due to neutropenia    Assessment & Plan:   Active Problems:   PNA (pneumonia)   Pneumonia   Acute hypoxic respiratory failure likely secondary to multifocal pneumonia. No PE on CTA chest but with patchy airspace opacities bilaterally suggestive of multifocal infiltrate, scattered small subpleural nodules.  BNP normal,  CHF ruled out.  Continued on cefepime and doxycycline. Streptococcus pneumonia antigen negative, Legionella antigen pnegative, respiratory virus panel negative.  No sputum available for sputum culture yet. Continue antitussives, supplemental oxygen as needed nebs.  Cefepime discontinued.  Blood transfusion on 8/12  helped  shortness of breath as well. He is weaned down to 2 L nasal cannula,  continue supplemental oxygen to maintain at least 92% or above.   Pancytopenia with leukopenia thrombocytopenia anemia:  Appreciate hematology input. Continue on Neupogen,  wbc still low, hematology has ordered 2 units PRBC transfusion.  Will monitor. Continue on neutropenic precautions.  Diflucan added 8/11. Continue his home  acyclovir.  AML in remission per hematology.    DVT/PE history: Continue Eliquis.   BPH/Hypertension Blood pressure was soft 8/11 92/53-discontinued his propanolol, decreased nightly Cardura to 1 mg.   BP improved.   HLD: Continue statins   Hypothyroidism:  Continue Synthroid   Deconditioning/debility in the setting of multiple comorbidities.  Continue  PT/ OT    DVT prophylaxis: Eliquis Code Status: Full code. Family Communication:  No family at bed side. Disposition Plan:    Status is: Inpatient  Remains inpatient appropriate because:Inpatient level of care appropriate due to severity of illness  Dispo: The patient is from: Home              Anticipated d/c is to: Home              Patient currently is not medically stable to d/c.   Difficult to place patient No  Consultants:  Heme/Oncology  Procedures:   Antimicrobials:   Anti-infectives (From admission, onward)    Start     Dose/Rate Route Frequency Ordered Stop   06/15/21 1000  fluconazole (DIFLUCAN) tablet 100 mg  Status:  Discontinued        100 mg Oral Daily 06/15/21 0726 06/15/21 0915   06/15/21 1000  fluconazole (DIFLUCAN) tablet 100 mg        100 mg Oral Daily with breakfast 06/15/21 0915     06/14/21 1100  doxycycline (VIBRA-TABS) tablet 100 mg        100 mg Oral Every 12 hours 06/14/21 1001     06/14/21 1045  acyclovir (ZOVIRAX) tablet 400 mg        400 mg Oral 2 times daily 06/14/21 0950     06/13/21 2200  ceFEPIme (MAXIPIME) 2 g in sodium chloride 0.9 % 100  mL IVPB  Status:  Discontinued        2 g 200 mL/hr over 30 Minutes Intravenous Every 8 hours 06/13/21 1829 06/17/21 0739   06/13/21 1345  ceFEPIme (MAXIPIME) 2 g in sodium chloride 0.9 % 100 mL IVPB        2 g 200 mL/hr over 30 Minutes Intravenous  Once 06/13/21 1342 06/13/21 1458   06/13/21 1300  cefTRIAXone (ROCEPHIN) 1 g in sodium chloride 0.9 % 100 mL IVPB  Status:  Discontinued        1 g 200 mL/hr over 30 Minutes Intravenous  Once  06/13/21 1259 06/13/21 1409   06/13/21 1300  azithromycin (ZITHROMAX) 500 mg in sodium chloride 0.9 % 250 mL IVPB  Status:  Discontinued        500 mg 250 mL/hr over 60 Minutes Intravenous  Once 06/13/21 1259 06/13/21 1342        Subjective: Patient was seen and examined at bedside.  Overnight events noted.   He reports feeling much better, still reports feeling exhausted but denies any chest pain and shortness of breath.   Patient was having breakfast,  denies any nausea and vomiting.  Objective: Vitals:   06/17/21 1338 06/17/21 1952 06/18/21 0456 06/18/21 1254  BP: (!) 151/67 (!) 145/58 (!) 125/52 137/60  Pulse: (!) 102 (!) 107 (!) 101 (!) 101  Resp: 18 18 18 18   Temp: (!) 97.5 F (36.4 C) 98.3 F (36.8 C) 98 F (36.7 C) 98.3 F (36.8 C)  TempSrc: Oral Oral Oral Oral  SpO2: 93% 94% 94% 94%  Weight:      Height:        Intake/Output Summary (Last 24 hours) at 06/18/2021 1304 Last data filed at 06/18/2021 1937 Gross per 24 hour  Intake 1098.5 ml  Output 575 ml  Net 523.5 ml    Filed Weights   06/13/21 1940  Weight: 92.5 kg    Examination:  General exam: sitting comfortably, denies any concerns, not in any acute distress. Respiratory system: Clear to auscultation, respiratory effort normal, RR 15 Cardiovascular system: S1 & S2 heard, RRR. No JVD, murmurs, rubs, gallops or clicks. No pedal edema. Gastrointestinal system: Abdomen is nondistended, soft and nontender. No organomegaly or masses felt. Normal bowel sounds heard. Central nervous system: Alert and oriented. No focal neurological deficits. Extremities: No edema, no cyanosis, no clubbing. Skin: No rashes, lesions or ulcers Psychiatry: Judgement and insight appear normal. Mood & affect appropriate.     Data Reviewed: I have personally reviewed following labs and imaging studies  CBC: Recent Labs  Lab 06/12/21 0945 06/12/21 0945 06/13/21 1109 06/14/21 0536 06/15/21 0423 06/16/21 0310 06/17/21 0300  06/18/21 0320  WBC 1.6*   < > 1.2* 0.8* 0.8* 0.8* 1.4* 2.3*  NEUTROABS 1.1*  --  0.7*  --   --   --  0.6*  --   HGB 10.4*   < > 10.1* 9.2* 8.4* 8.1* 9.9* 10.2*  HCT 32.1*  --  30.8* 29.0* 26.5* 25.7* 30.8* 32.0*  MCV 92.5  --  91.9 94.2 94.6 93.8 90.3 92.0  PLT 32*   < > 33* 29* 26* 28* 34* 39*   < > = values in this interval not displayed.    Basic Metabolic Panel: Recent Labs  Lab 06/12/21 0945 06/13/21 1109 06/14/21 0536 06/15/21 0423 06/16/21 0310 06/17/21 0300 06/18/21 0320  NA 133*   < > 136 135 135 140 135  K 3.9   < > 3.6 3.6  3.3* 3.1* 3.6  CL 98   < > 102 103 104 103 101  CO2 26   < > 25 25 23 27 27   GLUCOSE 133*   < > 108* 116* 108* 104* 100*  BUN 11   < > 10 13 13 15 12   CREATININE 1.00   < > 0.91 0.86 0.82 0.97 0.89  CALCIUM 8.5*   < > 7.9* 7.6* 7.8* 8.0* 8.2*  MG 1.9  --   --   --   --   --  1.9  PHOS  --   --   --   --   --   --  3.0   < > = values in this interval not displayed.    GFR: Estimated Creatinine Clearance: 71.9 mL/min (by C-G formula based on SCr of 0.89 mg/dL). Liver Function Tests: Recent Labs  Lab 06/12/21 0945 06/13/21 1109 06/14/21 0536 06/17/21 0300  AST 9* 13* 10* 10*  ALT 11 12 13 12   ALKPHOS 62 57 53 55  BILITOT 1.2 0.8 1.0 1.3*  PROT 5.5* 5.5* 5.2* 5.2*  ALBUMIN 3.3* 2.8* 2.5* 2.4*    No results for input(s): LIPASE, AMYLASE in the last 168 hours. No results for input(s): AMMONIA in the last 168 hours. Coagulation Profile: No results for input(s): INR, PROTIME in the last 168 hours. Cardiac Enzymes: No results for input(s): CKTOTAL, CKMB, CKMBINDEX, TROPONINI in the last 168 hours. BNP (last 3 results) No results for input(s): PROBNP in the last 8760 hours. HbA1C: No results for input(s): HGBA1C in the last 72 hours. CBG: No results for input(s): GLUCAP in the last 168 hours. Lipid Profile: No results for input(s): CHOL, HDL, LDLCALC, TRIG, CHOLHDL, LDLDIRECT in the last 72 hours. Thyroid Function Tests: No results  for input(s): TSH, T4TOTAL, FREET4, T3FREE, THYROIDAB in the last 72 hours. Anemia Panel: No results for input(s): VITAMINB12, FOLATE, FERRITIN, TIBC, IRON, RETICCTPCT in the last 72 hours. Sepsis Labs: No results for input(s): PROCALCITON, LATICACIDVEN in the last 168 hours.  Recent Results (from the past 240 hour(s))  Resp Panel by RT-PCR (Flu A&B, Covid) Nasopharyngeal Swab     Status: None   Collection Time: 06/13/21 11:09 AM   Specimen: Nasopharyngeal Swab; Nasopharyngeal(NP) swabs in vial transport medium  Result Value Ref Range Status   SARS Coronavirus 2 by RT PCR NEGATIVE NEGATIVE Final    Comment: (NOTE) SARS-CoV-2 target nucleic acids are NOT DETECTED.  The SARS-CoV-2 RNA is generally detectable in upper respiratory specimens during the acute phase of infection. The lowest concentration of SARS-CoV-2 viral copies this assay can detect is 138 copies/mL. A negative result does not preclude SARS-Cov-2 infection and should not be used as the sole basis for treatment or other patient management decisions. A negative result may occur with  improper specimen collection/handling, submission of specimen other than nasopharyngeal swab, presence of viral mutation(s) within the areas targeted by this assay, and inadequate number of viral copies(<138 copies/mL). A negative result must be combined with clinical observations, patient history, and epidemiological information. The expected result is Negative.  Fact Sheet for Patients:  EntrepreneurPulse.com.au  Fact Sheet for Healthcare Providers:  IncredibleEmployment.be  This test is no t yet approved or cleared by the [place] FDA and  has been authorized for detection and/or diagnosis of SARS-CoV-2 by FDA under an Emergency Use Authorization (EUA). This EUA will remain  in effect (meaning this test can be used) for the duration of the COVID-19 declaration under Section 564(b)(1)  of the Act,  21 U.S.C.section 360bbb-3(b)(1), unless the authorization is terminated  or revoked sooner.       Influenza A by PCR NEGATIVE NEGATIVE Final   Influenza B by PCR NEGATIVE NEGATIVE Final    Comment: (NOTE) The Xpert Xpress SARS-CoV-2/FLU/RSV plus assay is intended as an aid in the diagnosis of influenza from Nasopharyngeal swab specimens and should not be used as a sole basis for treatment. Nasal washings and aspirates are unacceptable for Xpert Xpress SARS-CoV-2/FLU/RSV testing.  Fact Sheet for Patients: EntrepreneurPulse.com.au  Fact Sheet for Healthcare Providers: IncredibleEmployment.be  This test is not yet approved or cleared by the [place] FDA and has been authorized for detection and/or diagnosis of SARS-CoV-2 by FDA under an Emergency Use Authorization (EUA). This EUA will remain in effect (meaning this test can be used) for the duration of the COVID-19 declaration under Section 564(b)(1) of the Act, 21 U.S.C. section 360bbb-3(b)(1), unless the authorization is terminated or revoked.  Performed at [facility], [address] [place], [place] [address]   Respiratory (~20 pathogens) panel by PCR     Status: None   Collection Time: 06/13/21  6:08 PM   Specimen: Nasopharyngeal Swab; Respiratory  Result Value Ref Range Status   Adenovirus NOT DETECTED NOT DETECTED Final   Coronavirus 229E NOT DETECTED NOT DETECTED Final    Comment: (NOTE) The Coronavirus on the Respiratory Panel, DOES NOT test for the novel  Coronavirus (2019 nCoV)    Coronavirus HKU1 NOT DETECTED NOT DETECTED Final   Coronavirus NL63 NOT DETECTED NOT DETECTED Final   Coronavirus OC43 NOT DETECTED NOT DETECTED Final   Metapneumovirus NOT DETECTED NOT DETECTED Final   Rhinovirus / Enterovirus NOT DETECTED NOT DETECTED Final   Influenza A NOT DETECTED NOT DETECTED Final   Influenza B NOT DETECTED NOT DETECTED Final   Parainfluenza Virus 1 NOT  DETECTED NOT DETECTED Final   Parainfluenza Virus 2 NOT DETECTED NOT DETECTED Final   Parainfluenza Virus 3 NOT DETECTED NOT DETECTED Final   Parainfluenza Virus 4 NOT DETECTED NOT DETECTED Final   Respiratory Syncytial Virus NOT DETECTED NOT DETECTED Final   Bordetella pertussis NOT DETECTED NOT DETECTED Final   Bordetella Parapertussis NOT DETECTED NOT DETECTED Final   Chlamydophila pneumoniae NOT DETECTED NOT DETECTED Final   Mycoplasma pneumoniae NOT DETECTED NOT DETECTED Final    Comment: Performed at [facility], [place]. [address], [place], [place] [address]   Radiology Studies: No results found.   Scheduled Meds:  acyclovir  400 mg Oral BID   apixaban  2.5 mg Oral BID   Chlorhexidine Gluconate Cloth  6 each Topical Daily   doxazosin  1 mg Oral QHS   doxycycline  100 mg Oral Q12H   filgrastim  480 mcg Subcutaneous Daily   fluconazole  100 mg Oral Q breakfast   guaiFENesin  600 mg Oral BID WC   ketoconazole  1 application Topical Once per day on Mon Thu   levothyroxine  150 mcg Oral Q0600   loratadine  10 mg Oral QHS   melatonin  5 mg Oral QHS   pantoprazole  40 mg Oral q1800   saccharomyces boulardii  250 mg Oral BID WC   simvastatin  40 mg Oral q1800   Continuous Infusions:    LOS: 4 days    Time spent: 25 mins    Loyola Santino, MD [facility]   If 7PM-7AM, please contact night-coverage

## 2021-06-19 ENCOUNTER — Inpatient Hospital Stay: Payer: Medicare Other

## 2021-06-19 DIAGNOSIS — D709 Neutropenia, unspecified: Secondary | ICD-10-CM | POA: Diagnosis not present

## 2021-06-19 DIAGNOSIS — C9201 Acute myeloblastic leukemia, in remission: Secondary | ICD-10-CM | POA: Diagnosis not present

## 2021-06-19 DIAGNOSIS — T451X5A Adverse effect of antineoplastic and immunosuppressive drugs, initial encounter: Secondary | ICD-10-CM

## 2021-06-19 DIAGNOSIS — R0602 Shortness of breath: Secondary | ICD-10-CM | POA: Diagnosis not present

## 2021-06-19 DIAGNOSIS — D61810 Antineoplastic chemotherapy induced pancytopenia: Secondary | ICD-10-CM

## 2021-06-19 LAB — CBC
HCT: 33.6 % — ABNORMAL LOW (ref 39.0–52.0)
Hemoglobin: 10.5 g/dL — ABNORMAL LOW (ref 13.0–17.0)
MCH: 28.7 pg (ref 26.0–34.0)
MCHC: 31.3 g/dL (ref 30.0–36.0)
MCV: 91.8 fL (ref 80.0–100.0)
Platelets: 47 10*3/uL — ABNORMAL LOW (ref 150–400)
RBC: 3.66 MIL/uL — ABNORMAL LOW (ref 4.22–5.81)
RDW: 17.7 % — ABNORMAL HIGH (ref 11.5–15.5)
WBC: 5.7 10*3/uL (ref 4.0–10.5)
nRBC: 0.4 % — ABNORMAL HIGH (ref 0.0–0.2)

## 2021-06-19 LAB — BASIC METABOLIC PANEL
Anion gap: 6 (ref 5–15)
BUN: 11 mg/dL (ref 8–23)
CO2: 27 mmol/L (ref 22–32)
Calcium: 8.2 mg/dL — ABNORMAL LOW (ref 8.9–10.3)
Chloride: 103 mmol/L (ref 98–111)
Creatinine, Ser: 0.84 mg/dL (ref 0.61–1.24)
GFR, Estimated: >60 mL/min (ref >60)
Glucose, Bld: 108 mg/dL — ABNORMAL HIGH (ref 70–99)
Potassium: 3.2 mmol/L — ABNORMAL LOW (ref 3.5–5.1)
Sodium: 136 mmol/L (ref 135–145)

## 2021-06-19 MED ORDER — HEPARIN SOD (PORK) LOCK FLUSH 100 UNIT/ML IV SOLN
500.0000 [IU] | INTRAVENOUS | Status: AC | PRN
  Administered 2021-06-19: 500 [IU]
  Filled 2021-06-19: qty 5

## 2021-06-19 MED ORDER — POTASSIUM CHLORIDE CRYS ER 10 MEQ PO TBCR
40.0000 meq | EXTENDED_RELEASE_TABLET | ORAL | Status: AC
  Administered 2021-06-19 (×2): 40 meq via ORAL
  Filled 2021-06-19 (×2): qty 4

## 2021-06-19 NOTE — TOC Transition Note (Signed)
Transition of Care [facility]) - CM/SW Discharge Note   Patient Details  Name: Victor [name] MRN: [id number] Date of Birth: 1936-02-16  Transition of Care [facility] CM/SW Contact:  Trish Mage, LCSW Phone Number: 06/19/2021, 2:25 PM   Clinical Narrative:   Patient who is stable for d/c is in need of home O2.  Orders, SAT note, seen and appreciated.  Spoke with Thedore Mins with [facility] who will arrange for delivery of home unit, travel cannister. No further needs identified.  TOC sign off.    Final next level of care: Home/Self Care Barriers to Discharge: No Barriers Identified   Patient Goals and CMS Choice        Discharge Placement                       Discharge Plan and Services                                     Social Determinants of Health (SDOH) Interventions     Readmission Risk Interventions No flowsheet data found.

## 2021-06-19 NOTE — Care Management Important Message (Signed)
Important Message  Patient Details IM Letter given to the Patient. Name: Victor [name] MRN: [id number] Date of Birth: 01/16/1936   Medicare Important Message Given:  Yes     Kerin Salen 06/19/2021, 12:13 PM

## 2021-06-19 NOTE — Progress Notes (Signed)
His white cell count is coming up quite nicely now.  We will stop the Neupogen.  We will stop the acyclovir and the Diflucan.  He is on doxycycline.  Maybe this can be stopped also.  His CBC shows white cell count of 5.7.  Hemoglobin 10.5.  Platelet count 47,000.  I think the cardiac monitor can also come off him.  I think that he probably could go home today.  He is little constipated.  He does not want a laxative.  His appetite is not all that great.  He thinks he will eat better when he gets home.  He has had no problems with cough.  He says his breathing is doing a little bit better.  This might be secondary to the fact that his hemoglobin is trending upward now.  His vital signs are temperature 97.7.  Pulse 88.  Blood pressure 140/61.  Oxygen saturation on room air is 92%.  His lungs sound clear bilaterally.  He has good air movement bilaterally.  Cardiac exam regular rate and rhythm.  Abdomen is soft.  Bowel sounds are present.  Extremity shows no clubbing, cyanosis or edema.  Again, Victor [name] has gone through the nadir of his blood counts.  They are coming back nicely.  We will stop the Neupogen.  I think his antibiotics can be stopped.  Hopefully, he will be able to go home today.  We will follow him up in the office weekly for lab work.  Lattie Haw, MD  [place] 16:23

## 2021-06-19 NOTE — Care Management Important Message (Signed)
Important Message  Patient Details  Name: Victor [name] MRN: [id number] Date of Birth: [birth year]   Medicare Important Message Given:  Yes     Kerin Salen 06/19/2021, 12:14 PM

## 2021-06-19 NOTE — Progress Notes (Signed)
SATURATION QUALIFICATIONS: (This note is used to comply with regulatory documentation for home oxygen)  Patient Saturations on Room Air at Rest = 85%  Patient Saturations on Room Air while Ambulating = 80%  Patient Saturations on 2 Liters of oxygen while Ambulating = 90%  Please briefly explain why patient needs home oxygen:  His desaturated without oxygen when he walks s/t to pneumonia.  Virginia Rochester, RN

## 2021-06-19 NOTE — Discharge Summary (Signed)
Physician Discharge Summary  Victor [name]:[id number] DOB: May 04, 1936 DOA: 06/13/2021  PCP: Victor Maw, MD  Admit date: 06/13/2021 Discharge date: 06/19/2021  Admitted From: Home Disposition: Home  Recommendations for Outpatient Follow-up:  Follow up with PCP in 1-2 weeks Follow-up with medical oncology, Dr. Marin [name] 1 week Please obtain BMP/CBC in one week  Home Health: No Equipment/Devices: Oxygen, 2.5 L per nasal cannula  Discharge Condition: Stable CODE STATUS: Full code Diet recommendation: Heart healthy diet  History of present illness:  LONN IM is an 85 year old male with past medical history significant for AML, hypothyroidism, PE/DVT on Eliquis who presented to [facility] ED on 8/9 with progressive shortness of breath x1 week.  Patient was prescribed pneumonia for possible pneumonia without improvement.  In the ED, CBC notable for neutropenia, thrombocytopenia, BNP 60.8, high sensitive troponin 9> 9, Covid-19 PCR/influenza A/B PCR negative.  Chest x-ray with chronic but increased bilateral pulmonary interstitial opacities concerning for interstitial edema versus atypical infection.  TRH consulted for further evaluation and management.   Hospital course:  Acute hypoxic respiratory failure, POA Multifocal pneumonia Patient presenting with 1 week history of progressive shortness of breath.  Patient with pancytopenia in the setting of active chemotherapy and chest x-ray with bilateral pulmonary interstitial opacities.  CTA chest with patchy airspace opacity bilaterally suggestive of multifocal infiltrate, negative for PE.  BNP within normal limits.  Strep pneumonia/Legionella urinary antigen negative.  Patient was started on empiric antibiotics and completed 6-day course while inpatient.  Patient was weaned down to 2.5 L supplemental oxygen with ambulation.  Recommend repeat chest x-ray in 4 weeks.  Pancytopenia Medical oncology/hematology was consulted and  followed during hospital course.  Etiology likely secondary to active chemotherapy.  Patient was started on Neupogen with rise in his blood counts.  Okay for discharge home per Dr. Marin [name].  Outpatient follow-up with medical oncology, recommend repeat CBC 1 week.  Hypokalemia: Repleted during the hospitalization.  Recommend repeat BMP 1 week  History of DVT/PE: CT angiogram chest on arrival negative for pulmonary embolism.  Continue Eliquis.  History of AML In remission per oncology/hematology.  Outpatient follow-up 1 week with Dr. Marin [name]  Essential hypertension Continue propranolol ER 80 mg p.o. daily  HLD: Continue simvastatin 40 mg p.o. daily  Hypothyroidism: Continue levothyroxine 150 mcg p.o. daily  BPH: Continue doxazosin 2 mg p.o. nightly    Discharge Diagnoses:  Active Problems:   * No active hospital problems. *    Discharge Instructions  Discharge Instructions     Call MD for:  difficulty breathing, headache or visual disturbances   Complete by: As directed    Call MD for:  extreme fatigue   Complete by: As directed    Call MD for:  persistant dizziness or light-headedness   Complete by: As directed    Call MD for:  persistant nausea and vomiting   Complete by: As directed    Call MD for:  severe uncontrolled pain   Complete by: As directed    Call MD for:  temperature >100.4   Complete by: As directed    Diet - low sodium heart healthy   Complete by: As directed    Increase activity slowly   Complete by: As directed       Allergies as of 06/19/2021       Reactions   Augmentin [amoxicillin-pot Clavulanate] Diarrhea   SOB        Medication List     STOP taking these medications  acyclovir 400 MG tablet Commonly known as: ZOVIRAX   amoxicillin-clavulanate 875-125 MG tablet Commonly known as: AUGMENTIN   hydrALAZINE 25 MG tablet Commonly known as: APRESOLINE       TAKE these medications    acetaminophen 500 MG tablet Commonly  known as: TYLENOL Take 1,000 mg by mouth every 6 (six) hours as needed for moderate pain.   apixaban 2.5 MG Tabs tablet Commonly known as: ELIQUIS Take 1 tablet (2.5 mg total) by mouth 2 (two) times daily.   CLARITIN PO Take 10 mg by mouth at bedtime. Allertec   doxazosin 2 MG tablet Commonly known as: CARDURA Take 1 tablet (2 mg total) by mouth at bedtime.   fluticasone 50 MCG/ACT nasal spray Commonly known as: FLONASE Place 2 sprays into the nose 2 (two) times daily as needed for allergies.   HYDROcodone-acetaminophen 5-325 MG tablet Commonly known as: NORCO/VICODIN Take 1 tablet by mouth every 6 (six) hours as needed for moderate pain.   ketoconazole 2 % shampoo Commonly known as: NIZORAL Apply 1 application topically 2 (two) times a week.   levothyroxine 150 MCG tablet Commonly known as: SYNTHROID Take 1 tablet (150 mcg total) by mouth daily.   Lidocaine 4 % Ptch Apply 0.5 patches topically daily as needed (pain). Uses 1/2 patch daily.   Melatonin 5 MG Chew Chew 5 mg by mouth daily.   omeprazole 20 MG capsule Commonly known as: PRILOSEC TAKE 1 CAPSULE BY MOUTH TWICE A DAY BEFORE A MEAL What changed: See the new instructions.   potassium chloride 10 MEQ CR capsule Commonly known as: MICRO-K Take 10 mEq by mouth in the morning and at bedtime.   propranolol ER 80 MG 24 hr capsule Commonly known as: INDERAL LA TAKE ONE CAPSULE BY MOUTH ONE TIME DAILY   simvastatin 40 MG tablet Commonly known as: ZOCOR TAKE ONE TABLET BY MOUTH DAILY AT BEDTIME What changed: when to take this   venetoclax 100 MG tablet Commonly known as: VENCLEXTA Take 400 mg by mouth daily. Takes 400 mg daily for 2 weeks and stops for 4 weeks.       ASK your doctor about these medications    lidocaine-prilocaine cream Commonly known as: EMLA Apply 1 application topically as needed for up to 30 doses.               Durable Medical Equipment  (From admission, onward)            Start     Ordered   06/19/21 1406  For home use only DME oxygen  Once       Comments: POC eval  Question Answer Comment  Length of Need Lifetime   Mode or (Route) Nasal cannula   Liters per Minute 2.5   Frequency Continuous (stationary and portable oxygen unit needed)   Oxygen conserving device Yes   Oxygen delivery system Gas      06/19/21 1405            Follow-up Information     [facility] Follow up.   Why: This is your home oxygen company.  Also known as [facility] Contact information: [address] [place] [address] [phone]                Allergies  Allergen Reactions   Augmentin [Amoxicillin-Pot Clavulanate] Diarrhea    SOB    Consultations: Medical oncology, Dr. Marin [name]   Procedures/Studies: CT Angio Chest Pulmonary Embolism (PE) W or WO Contrast  Result Date: 06/13/2021 CLINICAL DATA:  Respiratory difficulty EXAM: CT ANGIOGRAPHY CHEST WITH CONTRAST TECHNIQUE: Multidetector CT imaging of the chest was performed using the standard protocol during bolus administration of intravenous contrast. Multiplanar CT image reconstructions and MIPs were obtained to evaluate the vascular anatomy. CONTRAST:  80mL OMNIPAQUE IOHEXOL 350 MG/ML SOLN COMPARISON:  Chest x-ray from earlier in the same day, CT from 04/17/2021 FINDINGS: Cardiovascular: Thoracic aorta and its branches demonstrate atherosclerotic calcifications without aneurysmal dilatation or dissection. No cardiac enlargement is noted. Coronary calcifications are seen. The pulmonary artery is well visualized within normal branching pattern bilaterally. Previously seen filling defects have resolved in the interval. No new pulmonary emboli are seen. The peripheral lower lobe branches are limited somewhat due to patient respiratory artifact. Mediastinum/Nodes: Thoracic inlet is within normal limits. No sizable hilar or mediastinal adenopathy is noted. The esophagus as visualized is within  normal limits. Lungs/Pleura: Patchy airspace opacities are noted in the lower lobes bilaterally as well as in the left lingula considerable motion artifact is noted. A few scattered small subpleural nodules are seen better visualized on the current exam than on the prior exam due to slice thickness. No dominant sizable nodule is seen. Small right-sided pleural effusion is noted. Emphysematous changes are again noted. Upper Abdomen: Visualized upper abdomen shows no acute abnormality. Musculoskeletal: No chest wall abnormality. No acute or significant osseous findings. Review of the MIP images confirms the above findings. IMPRESSION: No acute pulmonary emboli are identified. Patient motion artifact somewhat limits the exam however. Previously seen residual emboli have resolved in the interval. Patchy airspace opacities bilaterally suggestive of multifocal infiltrate. Scattered small subpleural nodules measuring less than 5 mm. These are better visualized than on the prior exam due to thinner slice thickness. No follow-up needed if patient is low-risk (and has no known or suspected primary neoplasm). Non-contrast chest CT can be considered in 12 months if patient is high-risk. This recommendation follows the consensus statement: Guidelines for Management of Incidental Pulmonary Nodules Detected on CT Images: From the Fleischner Society 2017; Radiology 2017; 284:228-243. Aortic Atherosclerosis (ICD10-I70.0) and Emphysema (ICD10-J43.9). Electronically Signed   By: Inez Catalina M.D.   On: 06/13/2021 20:30   DG Chest Port 1 View  Result Date: 06/13/2021 CLINICAL DATA:  85 year old male with shortness of breath for 5-6 days after starting new medication. EXAM: PORTABLE CHEST 1 VIEW COMPARISON:  Chest CTA 04/17/2021 and earlier. FINDINGS: Portable AP semi upright view at 1048 hours. Stable right chest dual lumen power port. Lung volumes and mediastinal contours are stable and within normal limits. Chronic but increased  bilateral, basilar predominant pulmonary interstitial opacitysince May. No confluent opacity. No pneumothorax. No pleural effusion. No acute osseous abnormality identified. IMPRESSION: Chronic but increased bilateral pulmonary interstitial opacity. Differential considerations include mild/developing interstitial edema versus viral/atypical respiratory infection. Electronically Signed   By: Genevie Ann M.D.   On: 06/13/2021 11:44     Subjective: Patient seen examined bedside, resting comfortably.  Spouse present.  Seen by medical oncology this morning, okay for discharge home.  Patient will need home oxygen on discharge.  No other questions or concerns at this time.  Denies headache, no visual changes, no chest pain, no palpitations, no shortness of breath, no abdominal pain, no weakness, no fatigue, no paresthesias.  No acute events overnight per nursing staff.  Discharge Exam: Vitals:   06/19/21 0513 06/19/21 1300  BP: 140/61 (!) 146/53  Pulse: 88 (!) 102  Resp: 18 19  Temp: 97.7 F (36.5 C)  98.6 F (37 C)  SpO2: 92% 92%   Vitals:   06/18/21 1254 06/18/21 2028 06/19/21 0513 06/19/21 1300  BP: 137/60 (!) 143/54 140/61 (!) 146/53  Pulse: (!) 101 98 88 (!) 102  Resp: 18 18 18 19   Temp: 98.3 F (36.8 C) 98.9 F (37.2 C) 97.7 F (36.5 C) 98.6 F (37 C)  TempSrc: Oral Oral Oral Oral  SpO2: 94% 92% 92% 92%  Weight:      Height:        General: Pt is alert, awake, not in acute distress Cardiovascular: RRR, S1/S2 +, no rubs, no gallops Respiratory: CTA bilaterally, no wheezing, no rhonchi, on 2 L nasal cannula at rest Abdominal: Soft, NT, ND, bowel sounds + Extremities: no edema, no cyanosis    The results of significant diagnostics from this hospitalization (including imaging, microbiology, ancillary and laboratory) are listed below for reference.     Microbiology: Recent Results (from the past 240 hour(s))  Resp Panel by RT-PCR (Flu A&B, Covid) Nasopharyngeal Swab     Status: None    Collection Time: 06/13/21 11:09 AM   Specimen: Nasopharyngeal Swab; Nasopharyngeal(NP) swabs in vial transport medium  Result Value Ref Range Status   SARS Coronavirus 2 by RT PCR NEGATIVE NEGATIVE Final    Comment: (NOTE) SARS-CoV-2 target nucleic acids are NOT DETECTED.  The SARS-CoV-2 RNA is generally detectable in upper respiratory specimens during the acute phase of infection. The lowest concentration of SARS-CoV-2 viral copies this assay can detect is 138 copies/mL. A negative result does not preclude SARS-Cov-2 infection and should not be used as the sole basis for treatment or other patient management decisions. A negative result may occur with  improper specimen collection/handling, submission of specimen other than nasopharyngeal swab, presence of viral mutation(s) within the areas targeted by this assay, and inadequate number of viral copies(<138 copies/mL). A negative result must be combined with clinical observations, patient history, and epidemiological information. The expected result is Negative.  Fact Sheet for Patients:  EntrepreneurPulse.com.au  Fact Sheet for Healthcare Providers:  IncredibleEmployment.be  This test is no t yet approved or cleared by the [place] FDA and  has been authorized for detection and/or diagnosis of SARS-CoV-2 by FDA under an Emergency Use Authorization (EUA). This EUA will remain  in effect (meaning this test can be used) for the duration of the COVID-19 declaration under Section 564(b)(1) of the Act, 21 U.S.C.section 360bbb-3(b)(1), unless the authorization is terminated  or revoked sooner.       Influenza A by PCR NEGATIVE NEGATIVE Final   Influenza B by PCR NEGATIVE NEGATIVE Final    Comment: (NOTE) The Xpert Xpress SARS-CoV-2/FLU/RSV plus assay is intended as an aid in the diagnosis of influenza from Nasopharyngeal swab specimens and should not be used as a sole basis for treatment.  Nasal washings and aspirates are unacceptable for Xpert Xpress SARS-CoV-2/FLU/RSV testing.  Fact Sheet for Patients: EntrepreneurPulse.com.au  Fact Sheet for Healthcare Providers: IncredibleEmployment.be  This test is not yet approved or cleared by the [place] FDA and has been authorized for detection and/or diagnosis of SARS-CoV-2 by FDA under an Emergency Use Authorization (EUA). This EUA will remain in effect (meaning this test can be used) for the duration of the COVID-19 declaration under Section 564(b)(1) of the Act, 21 U.S.C. section 360bbb-3(b)(1), unless the authorization is terminated or revoked.  Performed at [facility], [address] [place], [place] [address]   Respiratory (~20 pathogens) panel by PCR  Status: None   Collection Time: 06/13/21  6:08 PM   Specimen: Nasopharyngeal Swab; Respiratory  Result Value Ref Range Status   Adenovirus NOT DETECTED NOT DETECTED Final   Coronavirus 229E NOT DETECTED NOT DETECTED Final    Comment: (NOTE) The Coronavirus on the Respiratory Panel, DOES NOT test for the novel  Coronavirus (2019 nCoV)    Coronavirus HKU1 NOT DETECTED NOT DETECTED Final   Coronavirus NL63 NOT DETECTED NOT DETECTED Final   Coronavirus OC43 NOT DETECTED NOT DETECTED Final   Metapneumovirus NOT DETECTED NOT DETECTED Final   Rhinovirus / Enterovirus NOT DETECTED NOT DETECTED Final   Influenza A NOT DETECTED NOT DETECTED Final   Influenza B NOT DETECTED NOT DETECTED Final   Parainfluenza Virus 1 NOT DETECTED NOT DETECTED Final   Parainfluenza Virus 2 NOT DETECTED NOT DETECTED Final   Parainfluenza Virus 3 NOT DETECTED NOT DETECTED Final   Parainfluenza Virus 4 NOT DETECTED NOT DETECTED Final   Respiratory Syncytial Virus NOT DETECTED NOT DETECTED Final   Bordetella pertussis NOT DETECTED NOT DETECTED Final   Bordetella Parapertussis NOT DETECTED NOT DETECTED Final   Chlamydophila pneumoniae  NOT DETECTED NOT DETECTED Final   Mycoplasma pneumoniae NOT DETECTED NOT DETECTED Final    Comment: Performed at [facility], [place]. [address], [place], [place] [address]     Labs: BNP (last 3 results) Recent Labs    08/04/20 1523 06/13/21 1424  BNP 46.5 18.8   Basic Metabolic Panel: Recent Labs  Lab 06/15/21 0423 06/16/21 0310 06/17/21 0300 06/18/21 0320 06/19/21 0403  NA 135 135 140 135 136  K 3.6 3.3* 3.1* 3.6 3.2*  CL 103 104 103 101 103  CO2 25 23 27 27 27   GLUCOSE 116* 108* 104* 100* 108*  BUN 13 13 15 12 11   CREATININE 0.86 0.82 0.97 0.89 0.84  CALCIUM 7.6* 7.8* 8.0* 8.2* 8.2*  MG  --   --   --  1.9  --   PHOS  --   --   --  3.0  --    Liver Function Tests: Recent Labs  Lab 06/13/21 1109 06/14/21 0536 06/17/21 0300  AST 13* 10* 10*  ALT 12 13 12   ALKPHOS 57 53 55  BILITOT 0.8 1.0 1.3*  PROT 5.5* 5.2* 5.2*  ALBUMIN 2.8* 2.5* 2.4*   No results for input(s): LIPASE, AMYLASE in the last 168 hours. No results for input(s): AMMONIA in the last 168 hours. CBC: Recent Labs  Lab 06/13/21 1109 06/14/21 0536 06/15/21 0423 06/16/21 0310 06/17/21 0300 06/18/21 0320 06/19/21 0403  WBC 1.2*   < > 0.8* 0.8* 1.4* 2.3* 5.7  NEUTROABS 0.7*  --   --   --  0.6*  --   --   HGB 10.1*   < > 8.4* 8.1* 9.9* 10.2* 10.5*  HCT 30.8*   < > 26.5* 25.7* 30.8* 32.0* 33.6*  MCV 91.9   < > 94.6 93.8 90.3 92.0 91.8  PLT 33*   < > 26* 28* 34* 39* 47*   < > = values in this interval not displayed.   Cardiac Enzymes: No results for input(s): CKTOTAL, CKMB, CKMBINDEX, TROPONINI in the last 168 hours. BNP: Invalid input(s): POCBNP CBG: No results for input(s): GLUCAP in the last 168 hours. D-Dimer No results for input(s): DDIMER in the last 72 hours. Hgb A1c No results for input(s): HGBA1C in the last 72 hours. Lipid Profile No results for input(s): CHOL, HDL, LDLCALC, TRIG, CHOLHDL, LDLDIRECT in  the last 72 hours. Thyroid function studies No results for input(s):  TSH, T4TOTAL, T3FREE, THYROIDAB in the last 72 hours.  Invalid input(s): FREET3 Anemia work up No results for input(s): VITAMINB12, FOLATE, FERRITIN, TIBC, IRON, RETICCTPCT in the last 72 hours. Urinalysis    Component Value Date/Time   COLORURINE YELLOW 06/06/2021 1414   APPEARANCEUR CLEAR 06/06/2021 1414   LABSPEC 1.020 06/06/2021 1414   PHURINE 6.0 06/06/2021 1414   GLUCOSEU NEGATIVE 06/06/2021 1414   HGBUR SMALL (A) 06/06/2021 1414   BILIRUBINUR NEGATIVE 06/06/2021 1414   KETONESUR NEGATIVE 06/06/2021 1414   PROTEINUR NEGATIVE 03/15/2021 1149   UROBILINOGEN 0.2 06/06/2021 1414   NITRITE NEGATIVE 06/06/2021 1414   LEUKOCYTESUR NEGATIVE 06/06/2021 1414   Sepsis Labs Invalid input(s): PROCALCITONIN,  WBC,  LACTICIDVEN Microbiology Recent Results (from the past 240 hour(s))  Resp Panel by RT-PCR (Flu A&B, Covid) Nasopharyngeal Swab     Status: None   Collection Time: 06/13/21 11:09 AM   Specimen: Nasopharyngeal Swab; Nasopharyngeal(NP) swabs in vial transport medium  Result Value Ref Range Status   SARS Coronavirus 2 by RT PCR NEGATIVE NEGATIVE Final    Comment: (NOTE) SARS-CoV-2 target nucleic acids are NOT DETECTED.  The SARS-CoV-2 RNA is generally detectable in upper respiratory specimens during the acute phase of infection. The lowest concentration of SARS-CoV-2 viral copies this assay can detect is 138 copies/mL. A negative result does not preclude SARS-Cov-2 infection and should not be used as the sole basis for treatment or other patient management decisions. A negative result may occur with  improper specimen collection/handling, submission of specimen other than nasopharyngeal swab, presence of viral mutation(s) within the areas targeted by this assay, and inadequate number of viral copies(<138 copies/mL). A negative result must be combined with clinical observations, patient history, and epidemiological information. The expected result is Negative.  Fact Sheet  for Patients:  EntrepreneurPulse.com.au  Fact Sheet for Healthcare Providers:  IncredibleEmployment.be  This test is no t yet approved or cleared by the [place] FDA and  has been authorized for detection and/or diagnosis of SARS-CoV-2 by FDA under an Emergency Use Authorization (EUA). This EUA will remain  in effect (meaning this test can be used) for the duration of the COVID-19 declaration under Section 564(b)(1) of the Act, 21 U.S.C.section 360bbb-3(b)(1), unless the authorization is terminated  or revoked sooner.       Influenza A by PCR NEGATIVE NEGATIVE Final   Influenza B by PCR NEGATIVE NEGATIVE Final    Comment: (NOTE) The Xpert Xpress SARS-CoV-2/FLU/RSV plus assay is intended as an aid in the diagnosis of influenza from Nasopharyngeal swab specimens and should not be used as a sole basis for treatment. Nasal washings and aspirates are unacceptable for Xpert Xpress SARS-CoV-2/FLU/RSV testing.  Fact Sheet for Patients: EntrepreneurPulse.com.au  Fact Sheet for Healthcare Providers: IncredibleEmployment.be  This test is not yet approved or cleared by the [place] FDA and has been authorized for detection and/or diagnosis of SARS-CoV-2 by FDA under an Emergency Use Authorization (EUA). This EUA will remain in effect (meaning this test can be used) for the duration of the COVID-19 declaration under Section 564(b)(1) of the Act, 21 U.S.C. section 360bbb-3(b)(1), unless the authorization is terminated or revoked.  Performed at [facility], [address] [place], [place] [address]   Respiratory (~20 pathogens) panel by PCR     Status: None   Collection Time: 06/13/21  6:08 PM   Specimen: Nasopharyngeal Swab; Respiratory  Result Value Ref Range  Status   Adenovirus NOT DETECTED NOT DETECTED Final   Coronavirus 229E NOT DETECTED NOT DETECTED Final    Comment: (NOTE) The  Coronavirus on the Respiratory Panel, DOES NOT test for the novel  Coronavirus (2019 nCoV)    Coronavirus HKU1 NOT DETECTED NOT DETECTED Final   Coronavirus NL63 NOT DETECTED NOT DETECTED Final   Coronavirus OC43 NOT DETECTED NOT DETECTED Final   Metapneumovirus NOT DETECTED NOT DETECTED Final   Rhinovirus / Enterovirus NOT DETECTED NOT DETECTED Final   Influenza A NOT DETECTED NOT DETECTED Final   Influenza B NOT DETECTED NOT DETECTED Final   Parainfluenza Virus 1 NOT DETECTED NOT DETECTED Final   Parainfluenza Virus 2 NOT DETECTED NOT DETECTED Final   Parainfluenza Virus 3 NOT DETECTED NOT DETECTED Final   Parainfluenza Virus 4 NOT DETECTED NOT DETECTED Final   Respiratory Syncytial Virus NOT DETECTED NOT DETECTED Final   Bordetella pertussis NOT DETECTED NOT DETECTED Final   Bordetella Parapertussis NOT DETECTED NOT DETECTED Final   Chlamydophila pneumoniae NOT DETECTED NOT DETECTED Final   Mycoplasma pneumoniae NOT DETECTED NOT DETECTED Final    Comment: Performed at [facility], [place] [address], [place], [place] [address]     Time coordinating discharge: Over 30 minutes  SIGNED:   Jachai Okazaki J [place] DO  [facility] 06/19/2021, 2:46 PM

## 2021-06-19 NOTE — Evaluation (Signed)
Occupational Therapy Evaluation Patient Details Name: Victor [name] MRN: [id number] DOB: 1936-08-09 Today's Date: 06/19/2021    History of Present Illness Patient is an 85 year old male presenting to PCP with dyspnea x1 week. Blood work showing neutropenia, thrombocytopenia, and anemia. PMH includes AML, hypothyroidism PE/DVT on Eliquis.   Clinical Impression   Patient evaluated by Occupational Therapy with no further acute OT needs identified. All education has been completed and the patient has no further questions. Patient was educated on using RW v.s. furniture walking to reduce falls risk. Patient was able to complete toileting tasks with MI with RW standing to void bladder. Patient chose to remove O2 to transition to bathroom with patient starting out at 90% on RA sitting on edge of bed. Patient was noted to drop down to 80% with ambulation and standing to complete toileting tasks. Patient required 2.5 L.min to recover to 91% within 1.5 mins. Nurse made aware. Patient and wife were educated on strategies to keep O2 in place during ADL tasks. Patient and wife verbalized understanding. See below for any follow-up Occupational Therapy or equipment needs. OT is signing off. Thank you for this referral.     SATURATION QUALIFICATIONS: (This note is used to comply with regulatory documentation for home oxygen)  Patient Saturations on Room Air at Rest = 90%  Patient Saturations on Room Air while Ambulating = 80% .  (See above for situation)  Patient Saturations on n/a Liters of oxygen while Ambulating = n/a%  Please briefly explain why patient needs home oxygen: patient was unable to recover on room air after ambulation requiring 2.5L min of O2 to return to 91%. Patient will need supplemental O2 at home to safely engage in ADL tasks to maintain O2 saturation above 90%.        Follow Up Recommendations  No OT follow up    Equipment Recommendations  None recommended by OT     Recommendations for Other Services       Precautions / Restrictions Precautions Precautions: Fall Precaution Comments: monitor O2/HR Restrictions Weight Bearing Restrictions: No      Mobility Bed Mobility                    Transfers                      Balance Overall balance assessment: Needs assistance Sitting-balance support: No upper extremity supported;Feet supported Sitting balance-Leahy Scale: Good     Standing balance support: No upper extremity supported Standing balance-Leahy Scale: Fair Standing balance comment: standing to void urine                           ADL either performed or assessed with clinical judgement   ADL Overall ADL's : At baseline                                       General ADL Comments: patient was noted to furniture walk when attempting ambulation without RW with SUP. patient was MI with rolling walker. patient reported having RW at home and he would "use it if needed". patient was able to Jhonnie/doff socks.patient took O2 cord off to get into bathroom with O2 saturation dropping to 80% on RA. patient was able to deep berath O2 saturation back up to 90% on 2.5L/min within 1.5 mins.  patients wife was in room with education on O2 management and techniques for longer O2 cord management. patient and wife verbalized understanding. SW consulted to get patient and wife education on O2 supplies for home.     Vision         Perception     Praxis      Pertinent Vitals/Pain Pain Assessment: No/denies pain     Hand Dominance     Extremity/Trunk Assessment Upper Extremity Assessment Upper Extremity Assessment: Overall WFL for tasks assessed   Lower Extremity Assessment Lower Extremity Assessment: Overall WFL for tasks assessed   Cervical / Trunk Assessment Cervical / Trunk Assessment: Normal   Communication Communication Communication: No difficulties   Cognition Arousal/Alertness:  Awake/alert Behavior During Therapy: WFL for tasks assessed/performed Overall Cognitive Status: Within Functional Limits for tasks assessed                                     General Comments       Exercises     Shoulder Instructions      Home Living Family/patient expects to be discharged to:: Private residence Living Arrangements: Spouse/significant other Available Help at Discharge: Family Type of Home: House Home Access: Level entry     Home Layout: One level     Bathroom Shower/Tub: Walk-in shower;Tub/shower unit   Bathroom Toilet: Standard     Home Equipment: Environmental consultant - 2 wheels;Walker - 4 wheels;Cane - single point;Shower seat;Grab bars - tub/shower          Prior Functioning/Environment Level of Independence: Independent                 OT Problem List: Decreased activity tolerance;Impaired balance (sitting and/or standing);Cardiopulmonary status limiting activity      OT Treatment/Interventions:      OT Goals(Current goals can be found in the care plan section)    OT Frequency:     Barriers to D/C:            Co-evaluation              AM-PAC OT "6 Clicks" Daily Activity     Outcome Measure Help from another person eating meals?: None Help from another person taking care of personal grooming?: None Help from another person toileting, which includes using toliet, bedpan, or urinal?: None Help from another person bathing (including washing, rinsing, drying)?: None Help from another person to put on and taking off regular upper body clothing?: None Help from another person to put on and taking off regular lower body clothing?: None 6 Click Score: 24   End of Session Equipment Utilized During Treatment: Oxygen;Rolling walker Nurse Communication: Other (comment) (O2 needs)  Activity Tolerance: Patient tolerated treatment well Patient left: in bed;with call bell/phone within reach;with family/visitor present  OT Visit  Diagnosis: Other abnormalities of gait and mobility (R26.89)                Time: 5697-9480 OT Time Calculation (min): 28 min Charges:  OT General Charges $OT Visit: 1 Visit OT Evaluation $OT Eval Low Complexity: 1 Low OT Treatments $Self Care/Home Management : 8-22 mins  Jackelyn Poling OTR/L, MS Acute Rehabilitation Department Office# [phone] Pager# [phone] 06/19/2021, 11:45 AM

## 2021-06-19 NOTE — Progress Notes (Signed)
SATURATION QUALIFICATIONS: (This note is used to comply with regulatory documentation for home oxygen)  Patient Saturations on Room Air at Rest = 88%  Patient Saturations on Room Air while Ambulating = 86%  Patient Saturations on 3 Liters of oxygen while Ambulating = 91%  Please briefly explain why patient needs home oxygen:pt requires O2 to allow adequate O2 saturations at rest and with exertion.    Baxter Flattery, PT  Acute Rehab Dept [facility]) [phone] Pager [phone]  06/19/2021

## 2021-06-19 NOTE — Progress Notes (Signed)
Physical Therapy Treatment Patient Details Name: Victor [name] MRN: [id number] DOB: [birth year] Today's Date: 06/19/2021    History of Present Illness Patient is an 85 year old male presenting to PCP with dyspnea x1 week. Blood work showing neutropenia, thrombocytopenia, and anemia. PMH includes AML, hypothyroidism PE/DVT on Eliquis.    PT Comments    Pt progressing with PT, improved activity tol. Continues to require O2 as noted below. Pt reports feeling much better, denies DOE.  Discussed activity progression at home/energy conservation.   Follow Up Recommendations  No PT follow up     Equipment Recommendations  None recommended by PT    Recommendations for Other Services       Precautions / Restrictions Precautions Precautions: Fall Precaution Comments: monitor O2/HR Restrictions Weight Bearing Restrictions: No    Mobility  Bed Mobility Overal bed mobility: Modified Independent                  Transfers     Transfers: Sit to/from Stand Sit to Stand: Supervision;Modified independent (Device/Increase time)         General transfer comment: for safety and line management  Ambulation/Gait Ambulation/Gait assistance: Supervision Gait Distance (Feet): 250 Feet Assistive device: Rolling walker (2 wheeled) Gait Pattern/deviations: Step-through pattern;Decreased stride length     General Gait Details: steady gait with RW, no LOB, amb on 3L O2 with SpO2=91%, HR max 123   Stairs             Wheelchair Mobility    Modified Rankin (Stroke Patients Only)       Balance Overall balance assessment: Needs assistance Sitting-balance support: No upper extremity supported;Feet supported Sitting balance-Leahy Scale: Good     Standing balance support: No upper extremity supported Standing balance-Leahy Scale: Fair Standing balance comment: reliant on UE support for dynamic tasks                            Cognition Arousal/Alertness:  Awake/alert Behavior During Therapy: WFL for tasks assessed/performed Overall Cognitive Status: Within Functional Limits for tasks assessed                                        Exercises      General Comments        Pertinent Vitals/Pain Pain Assessment: No/denies pain    Home Living Family/patient expects to be discharged to:: Private residence Living Arrangements: Spouse/significant other Available Help at Discharge: Family Type of Home: House Home Access: Level entry   Home Layout: One level Home Equipment: Environmental consultant - 2 wheels;Walker - 4 wheels;Cane - single point;Shower seat;Grab bars - tub/shower      Prior Function Level of Independence: Independent          PT Goals (current goals can now be found in the care plan section) Acute Rehab PT Goals Patient Stated Goal: "home when I'm ready" PT Goal Formulation: With patient Time For Goal Achievement: 06/30/21 Potential to Achieve Goals: Good Progress towards PT goals: Progressing toward goals    Frequency    Min 3X/week      PT Plan Current plan remains appropriate    Co-evaluation              AM-PAC PT "6 Clicks" Mobility   Outcome Measure  Help needed turning from your back to your side while in a flat bed  without using bedrails?: None Help needed moving from lying on your back to sitting on the side of a flat bed without using bedrails?: None Help needed moving to and from a bed to a chair (including a wheelchair)?: None Help needed standing up from a chair using your arms (e.g., wheelchair or bedside chair)?: None Help needed to walk in hospital room?: A Little Help needed climbing 3-5 steps with a railing? : A Little 6 Click Score: 22    End of Session Equipment Utilized During Treatment: Gait belt;Oxygen Activity Tolerance: Patient tolerated treatment well Patient left: with call bell/phone within reach;in bed;with family/visitor present Nurse Communication: Mobility  status PT Visit Diagnosis: Unsteadiness on feet (R26.81);Difficulty in walking, not elsewhere classified (R26.2)     Time: 0375-4360 PT Time Calculation (min) (ACUTE ONLY): 20 min  Charges:  $Gait Training: 8-22 mins                     Baxter Flattery, PT  Acute Rehab Dept ([place]) [phone] Pager [phone]  06/19/2021    [facility] 06/19/2021, 3:23 PM

## 2021-06-20 ENCOUNTER — Other Ambulatory Visit: Payer: Medicare Other

## 2021-06-20 ENCOUNTER — Telehealth: Payer: Self-pay | Admitting: *Deleted

## 2021-06-20 ENCOUNTER — Telehealth: Payer: Self-pay

## 2021-06-20 NOTE — Telephone Encounter (Signed)
Transition Care Management Follow-up Telephone Call Date of discharge and from where: [name]   06-19-2021 How have you been since you were released from the hospital? Doing better per wife patient sleeping Any questions or concerns? No  Items Reviewed: Did the pt receive and understand the discharge instructions provided? Yes  Medications obtained and verified? Yes  Other? No  Any new allergies since your discharge? No  Dietary orders reviewed? Yes Do you have support at home? Yes   Home Care and Equipment/Supplies: Were home health services ordered? yes If so, what is the name of the agency? [facility]  Has the agency set up a time to come to the patient's home? yes Were any new equipment or medical supplies ordered?  Yes: O2 What is the name of the medical supply agency? [facility] Were you able to get the supplies/equipment? no Do you have any questions related to the use of the equipment or supplies? No  Functional Questionnaire: (I = Independent and D = Dependent) ADLs: I  Bathing/Dressing- I  Meal Prep- I/D  Eating- I  Maintaining continence- I  Transferring/Ambulation- I  Managing Meds- I  Follow up appointments reviewed:  PCP Hospital f/u appt confirmed? No   Specialist Hospital f/u appt confirmed? Yes  Scheduled to see Ennever on 07-11-2021 Are transportation arrangements needed? No  If their condition worsens, is the pt aware to call PCP or go to the Emergency Dept.? Yes Was the patient provided with contact information for the PCP's office or ED? Yes Was to pt encouraged to call back with questions or concerns? Yes

## 2021-06-20 NOTE — Telephone Encounter (Signed)
Patients wife called informing us patient is out of the hospital and his next appt with [place] isnt for 2 more weeks and wants to know if he needs to come sooner, spoke with Dr.Ennever,pt to come in next Monday 8/22 for lab work. Patient and patients wife informed. Scheduling message sent.

## 2021-06-26 ENCOUNTER — Other Ambulatory Visit: Payer: Self-pay

## 2021-06-26 ENCOUNTER — Inpatient Hospital Stay: Payer: Medicare Other

## 2021-06-26 VITALS — BP 135/61 | HR 76 | Temp 97.6°F | Resp 20

## 2021-06-26 DIAGNOSIS — C9501 Acute leukemia of unspecified cell type, in remission: Secondary | ICD-10-CM

## 2021-06-26 DIAGNOSIS — Z452 Encounter for adjustment and management of vascular access device: Secondary | ICD-10-CM

## 2021-06-26 DIAGNOSIS — C9200 Acute myeloblastic leukemia, not having achieved remission: Secondary | ICD-10-CM | POA: Diagnosis not present

## 2021-06-26 LAB — CMP (CANCER CENTER ONLY)
ALT: 8 U/L (ref 0–44)
AST: 10 U/L — ABNORMAL LOW (ref 15–41)
Albumin: 3.4 g/dL — ABNORMAL LOW (ref 3.5–5.0)
Alkaline Phosphatase: 63 U/L (ref 38–126)
Anion gap: 7 (ref 5–15)
BUN: 13 mg/dL (ref 8–23)
CO2: 33 mmol/L — ABNORMAL HIGH (ref 22–32)
Calcium: 8.9 mg/dL (ref 8.9–10.3)
Chloride: 101 mmol/L (ref 98–111)
Creatinine: 1.05 mg/dL (ref 0.61–1.24)
GFR, Estimated: >60 mL/min (ref >60)
Glucose, Bld: 82 mg/dL (ref 70–99)
Potassium: 3.8 mmol/L (ref 3.5–5.1)
Sodium: 141 mmol/L (ref 135–145)
Total Bilirubin: 0.5 mg/dL (ref 0.3–1.2)
Total Protein: 5.6 g/dL — ABNORMAL LOW (ref 6.5–8.1)

## 2021-06-26 LAB — CBC WITH DIFFERENTIAL (CANCER CENTER ONLY)
Abs Immature Granulocytes: 0.2 10*3/uL — ABNORMAL HIGH (ref 0.00–0.07)
Basophils Absolute: 0.1 10*3/uL (ref 0.0–0.1)
Basophils Relative: 1 %
Eosinophils Absolute: 0 10*3/uL (ref 0.0–0.5)
Eosinophils Relative: 0 %
HCT: 34.9 % — ABNORMAL LOW (ref 39.0–52.0)
Hemoglobin: 10.9 g/dL — ABNORMAL LOW (ref 13.0–17.0)
Immature Granulocytes: 5 %
Lymphocytes Relative: 23 %
Lymphs Abs: 0.9 10*3/uL (ref 0.7–4.0)
MCH: 28.8 pg (ref 26.0–34.0)
MCHC: 31.2 g/dL (ref 30.0–36.0)
MCV: 92.3 fL (ref 80.0–100.0)
Monocytes Absolute: 0.6 10*3/uL (ref 0.1–1.0)
Monocytes Relative: 16 %
Neutro Abs: 2.1 10*3/uL (ref 1.7–7.7)
Neutrophils Relative %: 55 %
Platelet Count: 83 10*3/uL — ABNORMAL LOW (ref 150–400)
RBC: 3.78 MIL/uL — ABNORMAL LOW (ref 4.22–5.81)
RDW: 17.5 % — ABNORMAL HIGH (ref 11.5–15.5)
WBC Count: 3.9 10*3/uL — ABNORMAL LOW (ref 4.0–10.5)
nRBC: 0 % (ref 0.0–0.2)

## 2021-06-26 LAB — RETICULOCYTES
Immature Retic Fract: 21.3 % — ABNORMAL HIGH (ref 2.3–15.9)
RBC.: 3.8 MIL/uL — ABNORMAL LOW (ref 4.22–5.81)
Retic Count, Absolute: 68 10*3/uL (ref 19.0–186.0)
Retic Ct Pct: 1.8 % (ref 0.4–3.1)

## 2021-06-26 LAB — SAVE SMEAR(SSMR), FOR PROVIDER SLIDE REVIEW

## 2021-06-26 MED ORDER — HEPARIN SOD (PORK) LOCK FLUSH 100 UNIT/ML IV SOLN: 250.0000 [IU] | Freq: Once | INTRAVENOUS | Status: DC

## 2021-06-26 MED ORDER — SODIUM CHLORIDE 0.9% FLUSH: 10.0000 mL | Freq: Once | INTRAVENOUS | Status: DC

## 2021-06-26 MED ORDER — SODIUM CHLORIDE 0.9 % IV SOLN: Freq: Once | INTRAVENOUS | Status: AC

## 2021-06-26 NOTE — Patient Instructions (Signed)

## 2021-06-26 NOTE — Patient Instructions (Signed)
Implanted Port Home Guide An implanted port is a device that is placed under the skin. It is usually placed in the chest. The device can be used to give IV medicine, to take blood, or for dialysis. You may have an implanted port if: You need IV medicine that would be irritating to the small veins in your hands or arms. You need IV medicines, such as antibiotics, for a long period of time. You need IV nutrition for a long period of time. You need dialysis. When you have a port, your health care provider can choose to use the port instead of veins in your arms for these procedures. You may have fewer limitations when using a port than you would if you used other types of long-term IVs, and you will likely be able to return to normal activities afteryour incision heals. An implanted port has two main parts: Reservoir. The reservoir is the part where a needle is inserted to give medicines or draw blood. The reservoir is round. After it is placed, it appears as a small, raised area under your skin. Catheter. The catheter is a thin, flexible tube that connects the reservoir to a vein. Medicine that is inserted into the reservoir goes into the catheter and then into the vein. How is my port accessed? To access your port: A numbing cream may be placed on the skin over the port site. Your health care provider will put on a mask and sterile gloves. The skin over your port will be cleaned carefully with a germ-killing soap and allowed to dry. Your health care provider will gently pinch the port and insert a needle into it. Your health care provider will check for a blood return to make sure the port is in the vein and is not clogged. If your port needs to remain accessed to get medicine continuously (constant infusion), your health care provider will place a clear bandage (dressing) over the needle site. The dressing and needle will need to be changed every week, or as told by your health care provider. What  is flushing? Flushing helps keep the port from getting clogged. Follow instructions from your health care provider about how and when to flush the port. Ports are usually flushed with saline solution or a medicine called heparin. The need for flushing will depend on how the port is used: If the port is only used from time to time to give medicines or draw blood, the port may need to be flushed: Before and after medicines have been given. Before and after blood has been drawn. As part of routine maintenance. Flushing may be recommended every 4-6 weeks. If a constant infusion is running, the port may not need to be flushed. Throw away any syringes in a disposal container that is meant for sharp items (sharps container). You can buy a sharps container from a pharmacy, or you can make one by using an empty hard plastic bottle with a cover. How long will my port stay implanted? The port can stay in for as long as your health care provider thinks it is needed. When it is time for the port to come out, a surgery will be done to remove it. The surgery will be similar to the procedure that was done to putthe port in. Follow these instructions at home:  Flush your port as told by your health care provider. If you need an infusion over several days, follow instructions from your health care provider about how to take   care of your port site. Make sure you: Wash your hands with soap and water before you change your dressing. If soap and water are not available, use alcohol-based hand sanitizer. Change your dressing as told by your health care provider. Place any used dressings or infusion bags into a plastic bag. Throw that bag in the trash. Keep the dressing that covers the needle clean and dry. Do not get it wet. Do not use scissors or sharp objects near the tube. Keep the tube clamped, unless it is being used. Check your port site every day for signs of infection. Check for: Redness, swelling, or  pain. Fluid or blood. Pus or a bad smell. Protect the skin around the port site. Avoid wearing bra straps that rub or irritate the site. Protect the skin around your port from seat belts. Place a soft pad over your chest if needed. Bathe or shower as told by your health care provider. The site may get wet as long as you are not actively receiving an infusion. Return to your normal activities as told by your health care provider. Ask your health care provider what activities are safe for you. Carry a medical alert card or wear a medical alert bracelet at all times. This will let health care providers know that you have an implanted port in case of an emergency. Get help right away if: You have redness, swelling, or pain at the port site. You have fluid or blood coming from your port site. You have pus or a bad smell coming from the port site. You have a fever. Summary Implanted ports are usually placed in the chest for long-term IV access. Follow instructions from your health care provider about flushing the port and changing bandages (dressings). Take care of the area around your port by avoiding clothing that puts pressure on the area, and by watching for signs of infection. Protect the skin around your port from seat belts. Place a soft pad over your chest if needed. Get help right away if you have a fever or you have redness, swelling, pain, drainage, or a bad smell at the port site. This information is not intended to replace advice given to you by your health care provider. Make sure you discuss any questions you have with your healthcare provider. Document Revised: 03/07/2020 Document Reviewed: 03/07/2020 Elsevier Patient Education  2022 Elsevier Inc.  

## 2021-06-30 ENCOUNTER — Other Ambulatory Visit: Payer: Self-pay | Admitting: *Deleted

## 2021-06-30 ENCOUNTER — Other Ambulatory Visit: Payer: Self-pay | Admitting: Otolaryngology

## 2021-06-30 DIAGNOSIS — R1313 Dysphagia, pharyngeal phase: Secondary | ICD-10-CM

## 2021-06-30 DIAGNOSIS — C9501 Acute leukemia of unspecified cell type, in remission: Secondary | ICD-10-CM

## 2021-07-03 ENCOUNTER — Other Ambulatory Visit: Payer: Self-pay

## 2021-07-03 ENCOUNTER — Inpatient Hospital Stay: Payer: Medicare Other

## 2021-07-03 VITALS — BP 116/57 | HR 73 | Temp 98.0°F | Resp 20

## 2021-07-03 DIAGNOSIS — C9501 Acute leukemia of unspecified cell type, in remission: Secondary | ICD-10-CM

## 2021-07-03 DIAGNOSIS — C9200 Acute myeloblastic leukemia, not having achieved remission: Secondary | ICD-10-CM | POA: Diagnosis not present

## 2021-07-03 DIAGNOSIS — Z95828 Presence of other vascular implants and grafts: Secondary | ICD-10-CM

## 2021-07-03 DIAGNOSIS — C9500 Acute leukemia of unspecified cell type not having achieved remission: Secondary | ICD-10-CM

## 2021-07-03 LAB — CBC WITH DIFFERENTIAL (CANCER CENTER ONLY)
Abs Immature Granulocytes: 0.04 10*3/uL (ref 0.00–0.07)
Basophils Absolute: 0 10*3/uL (ref 0.0–0.1)
Basophils Relative: 1 %
Eosinophils Absolute: 0 10*3/uL (ref 0.0–0.5)
Eosinophils Relative: 0 %
HCT: 34.3 % — ABNORMAL LOW (ref 39.0–52.0)
Hemoglobin: 10.9 g/dL — ABNORMAL LOW (ref 13.0–17.0)
Immature Granulocytes: 1 %
Lymphocytes Relative: 33 %
Lymphs Abs: 1.2 10*3/uL (ref 0.7–4.0)
MCH: 29.4 pg (ref 26.0–34.0)
MCHC: 31.8 g/dL (ref 30.0–36.0)
MCV: 92.5 fL (ref 80.0–100.0)
Monocytes Absolute: 0.6 10*3/uL (ref 0.1–1.0)
Monocytes Relative: 15 %
Neutro Abs: 1.8 10*3/uL (ref 1.7–7.7)
Neutrophils Relative %: 50 %
Platelet Count: 70 10*3/uL — ABNORMAL LOW (ref 150–400)
RBC: 3.71 MIL/uL — ABNORMAL LOW (ref 4.22–5.81)
RDW: 18.4 % — ABNORMAL HIGH (ref 11.5–15.5)
WBC Count: 3.6 10*3/uL — ABNORMAL LOW (ref 4.0–10.5)
nRBC: 0 % (ref 0.0–0.2)

## 2021-07-03 LAB — CMP (CANCER CENTER ONLY)
ALT: 7 U/L (ref 0–44)
AST: 10 U/L — ABNORMAL LOW (ref 15–41)
Albumin: 3.5 g/dL (ref 3.5–5.0)
Alkaline Phosphatase: 54 U/L (ref 38–126)
Anion gap: 8 (ref 5–15)
BUN: 13 mg/dL (ref 8–23)
CO2: 29 mmol/L (ref 22–32)
Calcium: 8.8 mg/dL — ABNORMAL LOW (ref 8.9–10.3)
Chloride: 104 mmol/L (ref 98–111)
Creatinine: 1.14 mg/dL (ref 0.61–1.24)
GFR, Estimated: >60 mL/min (ref >60)
Glucose, Bld: 90 mg/dL (ref 70–99)
Potassium: 3.8 mmol/L (ref 3.5–5.1)
Sodium: 141 mmol/L (ref 135–145)
Total Bilirubin: 0.6 mg/dL (ref 0.3–1.2)
Total Protein: 5.3 g/dL — ABNORMAL LOW (ref 6.5–8.1)

## 2021-07-03 MED ORDER — HEPARIN SOD (PORK) LOCK FLUSH 100 UNIT/ML IV SOLN
500.0000 [IU] | Freq: Once | INTRAVENOUS | Status: AC
  Administered 2021-07-03: 500 [IU] via INTRAVENOUS

## 2021-07-03 MED ORDER — SODIUM CHLORIDE 0.9% FLUSH
10.0000 mL | INTRAVENOUS | Status: DC | PRN
  Administered 2021-07-03: 10 mL via INTRAVENOUS

## 2021-07-03 NOTE — Patient Instructions (Signed)
Implanted Port Home Guide An implanted port is a device that is placed under the skin. It is usually placed in the chest. The device can be used to give IV medicine, to take blood, or for dialysis. You may have an implanted port if: You need IV medicine that would be irritating to the small veins in your hands or arms. You need IV medicines, such as antibiotics, for a long period of time. You need IV nutrition for a long period of time. You need dialysis. When you have a port, your health care provider can choose to use the port instead of veins in your arms for these procedures. You may have fewer limitations when using a port than you would if you used other types of long-term IVs, and you will likely be able to return to normal activities afteryour incision heals. An implanted port has two main parts: Reservoir. The reservoir is the part where a needle is inserted to give medicines or draw blood. The reservoir is round. After it is placed, it appears as a small, raised area under your skin. Catheter. The catheter is a thin, flexible tube that connects the reservoir to a vein. Medicine that is inserted into the reservoir goes into the catheter and then into the vein. How is my port accessed? To access your port: A numbing cream may be placed on the skin over the port site. Your health care provider will put on a mask and sterile gloves. The skin over your port will be cleaned carefully with a germ-killing soap and allowed to dry. Your health care provider will gently pinch the port and insert a needle into it. Your health care provider will check for a blood return to make sure the port is in the vein and is not clogged. If your port needs to remain accessed to get medicine continuously (constant infusion), your health care provider will place a clear bandage (dressing) over the needle site. The dressing and needle will need to be changed every week, or as told by your health care provider. What  is flushing? Flushing helps keep the port from getting clogged. Follow instructions from your health care provider about how and when to flush the port. Ports are usually flushed with saline solution or a medicine called heparin. The need for flushing will depend on how the port is used: If the port is only used from time to time to give medicines or draw blood, the port may need to be flushed: Before and after medicines have been given. Before and after blood has been drawn. As part of routine maintenance. Flushing may be recommended every 4-6 weeks. If a constant infusion is running, the port may not need to be flushed. Throw away any syringes in a disposal container that is meant for sharp items (sharps container). You can buy a sharps container from a pharmacy, or you can make one by using an empty hard plastic bottle with a cover. How long will my port stay implanted? The port can stay in for as long as your health care provider thinks it is needed. When it is time for the port to come out, a surgery will be done to remove it. The surgery will be similar to the procedure that was done to putthe port in. Follow these instructions at home:  Flush your port as told by your health care provider. If you need an infusion over several days, follow instructions from your health care provider about how to take   care of your port site. Make sure you: Wash your hands with soap and water before you change your dressing. If soap and water are not available, use alcohol-based hand sanitizer. Change your dressing as told by your health care provider. Place any used dressings or infusion bags into a plastic bag. Throw that bag in the trash. Keep the dressing that covers the needle clean and dry. Do not get it wet. Do not use scissors or sharp objects near the tube. Keep the tube clamped, unless it is being used. Check your port site every day for signs of infection. Check for: Redness, swelling, or  pain. Fluid or blood. Pus or a bad smell. Protect the skin around the port site. Avoid wearing bra straps that rub or irritate the site. Protect the skin around your port from seat belts. Place a soft pad over your chest if needed. Bathe or shower as told by your health care provider. The site may get wet as long as you are not actively receiving an infusion. Return to your normal activities as told by your health care provider. Ask your health care provider what activities are safe for you. Carry a medical alert card or wear a medical alert bracelet at all times. This will let health care providers know that you have an implanted port in case of an emergency. Get help right away if: You have redness, swelling, or pain at the port site. You have fluid or blood coming from your port site. You have pus or a bad smell coming from the port site. You have a fever. Summary Implanted ports are usually placed in the chest for long-term IV access. Follow instructions from your health care provider about flushing the port and changing bandages (dressings). Take care of the area around your port by avoiding clothing that puts pressure on the area, and by watching for signs of infection. Protect the skin around your port from seat belts. Place a soft pad over your chest if needed. Get help right away if you have a fever or you have redness, swelling, pain, drainage, or a bad smell at the port site. This information is not intended to replace advice given to you by your health care provider. Make sure you discuss any questions you have with your healthcare provider. Document Revised: 03/07/2020 Document Reviewed: 03/07/2020 Elsevier Patient Education  2022 Elsevier Inc.  

## 2021-07-04 ENCOUNTER — Other Ambulatory Visit: Payer: Medicare Other

## 2021-07-07 ENCOUNTER — Ambulatory Visit
Admission: RE | Admit: 2021-07-07 | Discharge: 2021-07-07 | Disposition: A | Discharge status: Home / Self Care | Payer: Medicare Other | Source: Ambulatory Visit | Attending: Otolaryngology | Admitting: Otolaryngology

## 2021-07-07 ENCOUNTER — Other Ambulatory Visit: Payer: Self-pay

## 2021-07-07 DIAGNOSIS — C9501 Acute leukemia of unspecified cell type, in remission: Secondary | ICD-10-CM

## 2021-07-07 DIAGNOSIS — C9500 Acute leukemia of unspecified cell type not having achieved remission: Secondary | ICD-10-CM

## 2021-07-07 DIAGNOSIS — R1313 Dysphagia, pharyngeal phase: Secondary | ICD-10-CM

## 2021-07-11 ENCOUNTER — Inpatient Hospital Stay: Payer: Medicare Other

## 2021-07-11 ENCOUNTER — Inpatient Hospital Stay (HOSPITAL_BASED_OUTPATIENT_CLINIC_OR_DEPARTMENT_OTHER): Payer: Medicare Other | Admitting: Hematology & Oncology

## 2021-07-11 ENCOUNTER — Other Ambulatory Visit: Payer: Self-pay

## 2021-07-11 ENCOUNTER — Inpatient Hospital Stay: Payer: Medicare Other | Attending: Oncology

## 2021-07-11 ENCOUNTER — Encounter: Payer: Self-pay | Admitting: Hematology & Oncology

## 2021-07-11 ENCOUNTER — Telehealth: Payer: Self-pay

## 2021-07-11 VITALS — BP 124/59 | HR 75 | Temp 97.8°F | Resp 20 | Wt 205.0 lb

## 2021-07-11 DIAGNOSIS — C9500 Acute leukemia of unspecified cell type not having achieved remission: Secondary | ICD-10-CM

## 2021-07-11 DIAGNOSIS — J449 Chronic obstructive pulmonary disease, unspecified: Secondary | ICD-10-CM

## 2021-07-11 DIAGNOSIS — Z79899 Other long term (current) drug therapy: Secondary | ICD-10-CM | POA: Diagnosis not present

## 2021-07-11 DIAGNOSIS — C9501 Acute leukemia of unspecified cell type, in remission: Secondary | ICD-10-CM | POA: Diagnosis not present

## 2021-07-11 DIAGNOSIS — C9200 Acute myeloblastic leukemia, not having achieved remission: Secondary | ICD-10-CM | POA: Insufficient documentation

## 2021-07-11 LAB — CMP (CANCER CENTER ONLY)
ALT: 7 U/L (ref 0–44)
AST: 11 U/L — ABNORMAL LOW (ref 15–41)
Albumin: 3.8 g/dL (ref 3.5–5.0)
Alkaline Phosphatase: 64 U/L (ref 38–126)
Anion gap: 8 (ref 5–15)
BUN: 7 mg/dL — ABNORMAL LOW (ref 8–23)
CO2: 28 mmol/L (ref 22–32)
Calcium: 8.6 mg/dL — ABNORMAL LOW (ref 8.9–10.3)
Chloride: 105 mmol/L (ref 98–111)
Creatinine: 0.99 mg/dL (ref 0.61–1.24)
GFR, Estimated: >60 mL/min (ref >60)
Glucose, Bld: 98 mg/dL (ref 70–99)
Potassium: 4 mmol/L (ref 3.5–5.1)
Sodium: 141 mmol/L (ref 135–145)
Total Bilirubin: 0.7 mg/dL (ref 0.3–1.2)
Total Protein: 5.5 g/dL — ABNORMAL LOW (ref 6.5–8.1)

## 2021-07-11 LAB — CBC WITH DIFFERENTIAL (CANCER CENTER ONLY)
Abs Immature Granulocytes: 0.05 10*3/uL (ref 0.00–0.07)
Basophils Absolute: 0 10*3/uL (ref 0.0–0.1)
Basophils Relative: 1 %
Eosinophils Absolute: 0 10*3/uL (ref 0.0–0.5)
Eosinophils Relative: 0 %
HCT: 36 % — ABNORMAL LOW (ref 39.0–52.0)
Hemoglobin: 11.2 g/dL — ABNORMAL LOW (ref 13.0–17.0)
Immature Granulocytes: 1 %
Lymphocytes Relative: 29 %
Lymphs Abs: 1.3 10*3/uL (ref 0.7–4.0)
MCH: 29.2 pg (ref 26.0–34.0)
MCHC: 31.1 g/dL (ref 30.0–36.0)
MCV: 94 fL (ref 80.0–100.0)
Monocytes Absolute: 0.6 10*3/uL (ref 0.1–1.0)
Monocytes Relative: 13 %
Neutro Abs: 2.5 10*3/uL (ref 1.7–7.7)
Neutrophils Relative %: 56 %
Platelet Count: 91 10*3/uL — ABNORMAL LOW (ref 150–400)
RBC: 3.83 MIL/uL — ABNORMAL LOW (ref 4.22–5.81)
RDW: 18.5 % — ABNORMAL HIGH (ref 11.5–15.5)
WBC Count: 4.5 10*3/uL (ref 4.0–10.5)
nRBC: 0 % (ref 0.0–0.2)

## 2021-07-11 LAB — LACTATE DEHYDROGENASE: LDH: 159 U/L (ref 98–192)

## 2021-07-11 LAB — SAVE SMEAR(SSMR), FOR PROVIDER SLIDE REVIEW

## 2021-07-11 MED ORDER — SODIUM CHLORIDE 0.9 % IV SOLN
10.0000 mg | Freq: Once | INTRAVENOUS | Status: AC
  Administered 2021-07-11: 10 mg via INTRAVENOUS
  Filled 2021-07-11: qty 10

## 2021-07-11 MED ORDER — SODIUM CHLORIDE 0.9 % IV SOLN
49.5000 mg/m2 | Freq: Once | INTRAVENOUS | Status: AC
  Administered 2021-07-11: 100 mg via INTRAVENOUS
  Filled 2021-07-11: qty 10

## 2021-07-11 MED ORDER — HEPARIN SOD (PORK) LOCK FLUSH 100 UNIT/ML IV SOLN
500.0000 [IU] | Freq: Once | INTRAVENOUS | Status: AC | PRN
  Administered 2021-07-11: 500 [IU]

## 2021-07-11 MED ORDER — SODIUM CHLORIDE 0.9% FLUSH
10.0000 mL | INTRAVENOUS | Status: DC | PRN
  Administered 2021-07-11: 10 mL

## 2021-07-11 MED ORDER — PALONOSETRON HCL INJECTION 0.25 MG/5ML
0.2500 mg | Freq: Once | INTRAVENOUS | Status: AC
  Administered 2021-07-11: 0.25 mg via INTRAVENOUS
  Filled 2021-07-11: qty 5

## 2021-07-11 MED ORDER — SODIUM CHLORIDE 0.9 % IV SOLN: Freq: Once | INTRAVENOUS | Status: AC

## 2021-07-11 NOTE — Progress Notes (Signed)
Okay to treat today with pltc 91 per Dr. [name].

## 2021-07-11 NOTE — Progress Notes (Signed)
Hematology and Oncology Follow Up Visit  Victor [name] [id number] May 20, 1936 85 y.o. 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 twice daily --started on 03/16/2021     Interim History:  Victor [name] is back for follow-up.  He is doing quite nicely.  He really has had a very nice response to the Vidaza/venetoclax combination.  He is followed by Dr. Linus Orn at a [facility].  He saw Dr. Linus Orn a week ago.  Dr. Linus Orn was happy with the protocol that he is taking.  I think he is recovered nicely from the pneumonia that he had.  He is in the hospital because of pneumonia.  We will get a chest x-ray on him next week to see how the pneumonia is resolving radiographically.  He is not wearing any oxygen.  He does not use oxygen at home.  I told him that he can call the homecare company to pick up the oxygen.  He still has a back issues.  This is lower back.  This is not related to many of his treatments.  He is on the Eliquis to 2.5 mg p.o. twice daily.  He does have the IVC filter in.  I will repeat the Doppler of his right leg next week.  We will see how everything looks.    He has had no problems with nausea or vomiting.  He did have a nice Labor Day weekend.  He has had no problems with bleeding.  He is on venetoclax.  He does well with the venetoclax and Vidaza combination.  Overall, I would have to say his performance status is ECOG 1.   Medications:  Current Outpatient Medications:    acetaminophen (TYLENOL) 500 MG tablet, Take 1,000 mg by mouth every 6 (six) hours as needed for moderate pain., Disp: , Rfl:    apixaban (ELIQUIS) 2.5 MG TABS tablet, Take 1 tablet (2.5 mg total) by mouth 2 (two) times daily., Disp: 60 tablet, Rfl: 6   doxazosin (CARDURA) 2 MG tablet, Take 1 tablet (2 mg total) by mouth at bedtime., Disp: 90  tablet, Rfl: 3   HYDROcodone-acetaminophen (NORCO/VICODIN) 5-325 MG tablet, Take 1 tablet by mouth every 6 (six) hours as needed for moderate pain., Disp: , Rfl:    ipratropium (ATROVENT) 0.06 % nasal spray, Place into both nostrils., Disp: , Rfl:    ketoconazole (NIZORAL) 2 % shampoo, Apply 1 application topically 2 (two) times a week., Disp: , Rfl:    levothyroxine (SYNTHROID) 150 MCG tablet, Take 1 tablet (150 mcg total) by mouth daily., Disp: 90 tablet, Rfl: 3   Lidocaine 4 % PTCH, Apply 0.5 patches topically daily as needed (pain). Uses 1/2 patch daily., Disp: , Rfl:    lidocaine-prilocaine (EMLA) cream, Apply 1 application topically as needed for up to 30 doses. (Patient taking differently: Apply 1 application topically daily as needed (port access).), Disp: 30 g, Rfl: 0   Loratadine (CLARITIN PO), Take 10 mg by mouth at bedtime. Allertec, Disp: , Rfl:    Melatonin 5 MG CHEW, Chew 5 mg by mouth daily., Disp: , Rfl:    omeprazole (PRILOSEC) 20 MG capsule, TAKE 1 CAPSULE BY MOUTH TWICE A DAY BEFORE A MEAL (Patient taking differently: Take 20 mg by mouth daily as needed (heart burn).), Disp: 180 capsule, Rfl: 0   potassium chloride (MICRO-K) 10 MEQ CR capsule, Take  10 mEq by mouth in the morning and at bedtime., Disp: , Rfl:    propranolol ER (INDERAL LA) 80 MG 24 hr capsule, TAKE ONE CAPSULE BY MOUTH ONE TIME DAILY (Patient taking differently: Take 80 mg by mouth daily.), Disp: 90 capsule, Rfl: 0   simvastatin (ZOCOR) 40 MG tablet, TAKE ONE TABLET BY MOUTH DAILY AT BEDTIME (Patient taking differently: Take 40 mg by mouth daily at 6 PM.), Disp: 90 tablet, Rfl: 3   venetoclax 100 MG TABS, Take 400 mg by mouth daily. Takes 400 mg daily for 2 weeks and stops for 4 weeks., Disp: , Rfl:    fluticasone (FLONASE) 50 MCG/ACT nasal spray, Place 2 sprays into the nose 2 (two) times daily as needed for allergies., Disp: , Rfl:  No current facility-administered medications for this  visit.  Facility-Administered Medications Ordered in Other Visits:    azaCITIDine (VIDAZA) 100 mg in sodium chloride 0.9 % 50 mL chemo infusion, 49.5 mg/m2 (Treatment Plan Recorded), Intravenous, Once, Ennever, Rudell Cobb, MD   dexamethasone (DECADRON) 10 mg in sodium chloride 0.9 % 50 mL IVPB, 10 mg, Intravenous, Once, Ennever, Rudell Cobb, MD   heparin lock flush 100 unit/mL, 500 Units, Intracatheter, Once PRN, Ennever, Rudell Cobb, MD   sodium chloride flush (NS) 0.9 % injection 10 mL, 10 mL, Intracatheter, PRN, Volanda Napoleon, MD  Allergies:  Allergies  Allergen Reactions   Augmentin [Amoxicillin-Pot Clavulanate] Diarrhea    SOB    Past Medical History, Surgical history, Social history, and Family History were reviewed and updated.  Review of Systems: Review of Systems  Constitutional: Negative.   HENT:  Negative.    Eyes: Negative.   Respiratory: Negative.    Cardiovascular: Negative.   Gastrointestinal: Negative.   Endocrine: Negative.   Genitourinary: Negative.    Musculoskeletal: Negative.   Skin: Negative.   Neurological: Negative.   Hematological: Negative.   Psychiatric/Behavioral: Negative.     Physical Exam:  weight is 205 lb (93 kg). His oral temperature is 97.8 F (36.6 C). His blood pressure is 124/59 (abnormal) and his pulse is 75. His respiration is 20 and oxygen saturation is 97%.   Wt Readings from Last 3 Encounters:  07/11/21 205 lb (93 kg)  06/13/21 203 lb 14.8 oz (92.5 kg)  06/06/21 211 lb (95.7 kg)    Physical Exam Vitals reviewed.  HENT:     Head: Normocephalic and atraumatic.  Eyes:     Pupils: Pupils are equal, round, and reactive to light.  Cardiovascular:     Rate and Rhythm: Normal rate and regular rhythm.     Heart sounds: Normal heart sounds.  Pulmonary:     Effort: Pulmonary effort is normal.     Breath sounds: Normal breath sounds.  Abdominal:     General: Bowel sounds are normal.     Palpations: Abdomen is soft.  Musculoskeletal:         General: No tenderness or deformity. Normal range of motion.     Cervical back: Normal range of motion.  Lymphadenopathy:     Cervical: No cervical adenopathy.  Skin:    General: Skin is warm and dry.     Findings: No erythema or rash.  Neurological:     Mental Status: He is alert and oriented to person, place, and time.  Psychiatric:        Behavior: Behavior normal.        Thought Content: Thought content normal.        Judgment:  Judgment normal.     Lab Results  Component Value Date   WBC 4.5 07/11/2021   HGB 11.2 (L) 07/11/2021   HCT 36.0 (L) 07/11/2021   MCV 94.0 07/11/2021   PLT 91 (L) 07/11/2021     Chemistry      Component Value Date/Time   NA 141 07/11/2021 0934   NA 142 03/30/2015 1149   K 4.0 07/11/2021 0934   K 4.5 03/30/2015 1149   CL 105 07/11/2021 0934   CO2 28 07/11/2021 0934   CO2 25 03/30/2015 1149   BUN 7 (L) 07/11/2021 0934   BUN 17.1 03/30/2015 1149   CREATININE 0.99 07/11/2021 0934   CREATININE 1.2 03/30/2015 1149      Component Value Date/Time   CALCIUM 8.6 (L) 07/11/2021 0934   CALCIUM 8.7 03/30/2015 1149   ALKPHOS 64 07/11/2021 0934   AST 11 (L) 07/11/2021 0934   ALT 7 07/11/2021 0934   BILITOT 0.7 07/11/2021 0934       Impression and Plan: Mr. Baggerly is a very nice 85 year old white male who has acute myeloid leukemia.  He does have a adverse prognostic feature with respect to the FLT3 mutation.  I know there are targeted therapies for patients who have leukemia with this FLT3 mutation.  Again, Dr. Linus Orn is doing a fantastic job with him.  He is responding well to the Vidaza and the venetoclax.  There is no indication that we have to put him on one of the new targeted therapy.  He will start his 8th cycle of treatment.  He only gets the venetoclax only 14 days.  He gets a Vidaza for 5 days.  We will see how the chest x-ray looks.  We we will see how the Doppler looks.  We will plan to get him back in another month.   Volanda Napoleon, MD 9/6/202210:55 AM

## 2021-07-11 NOTE — Telephone Encounter (Signed)
Appts made per 07/11/21 los, pt to gain updated sch at [employer] and through [employer]

## 2021-07-11 NOTE — Patient Instructions (Signed)
[facility]  Discharge Instructions: Thank you for choosing [facility] to provide your oncology and hematology care.   If you have a lab appointment with the [facility], please go directly to the [facility] and check in at the registration area.  Wear comfortable clothing and clothing appropriate for easy access to any Portacath or PICC line.   We strive to give you quality time with your provider. You may need to reschedule your appointment if you arrive late (15 or more minutes).  Arriving late affects you and other patients whose appointments are after yours.  Also, if you miss three or more appointments without notifying the office, you may be dismissed from the clinic at the provider's discretion.      For prescription refill requests, have your pharmacy contact our office and allow 72 hours for refills to be completed.    Today you received the following chemotherapy and/or immunotherapy agents Azacytidine      To help prevent nausea and vomiting after your treatment, we encourage you to take your nausea medication as directed.  BELOW ARE SYMPTOMS THAT SHOULD BE REPORTED IMMEDIATELY: *FEVER GREATER THAN 100.4 F (38 C) OR HIGHER *CHILLS OR SWEATING *NAUSEA AND VOMITING THAT IS NOT CONTROLLED WITH YOUR NAUSEA MEDICATION *UNUSUAL SHORTNESS OF BREATH *UNUSUAL BRUISING OR BLEEDING *URINARY PROBLEMS (pain or burning when urinating, or frequent urination) *BOWEL PROBLEMS (unusual diarrhea, constipation, pain near the anus) TENDERNESS IN MOUTH AND THROAT WITH OR WITHOUT PRESENCE OF ULCERS (sore throat, sores in mouth, or a toothache) UNUSUAL RASH, SWELLING OR PAIN  UNUSUAL VAGINAL DISCHARGE OR ITCHING   Items with * indicate a potential emergency and should be followed up as soon as possible or go to the Emergency Department if any problems should occur.  Please show the CHEMOTHERAPY ALERT CARD or IMMUNOTHERAPY ALERT CARD at check-in to the  Emergency Department and triage nurse. Should you have questions after your visit or need to cancel or reschedule your appointment, please contact [facility]  [phone] and follow the prompts.  Office hours are 8:00 a.m. to 4:30 p.m. Monday - Friday. Please note that voicemails left after 4:00 p.m. may not be returned until the following business day.  We are closed weekends and major holidays. You have access to a nurse at all times for urgent questions. Please call the main number to the clinic [phone] and follow the prompts.  For any non-urgent questions, you may also contact your provider using MyChart. We now offer e-Visits for anyone 53 and older to request care online for non-urgent symptoms. For details visit [email or web address].   Also download the MyChart app! Go to the app store, search "MyChart", open the app, select [facility], and log in with your MyChart username and password.  Due to Covid, a mask is required upon entering the hospital/clinic. If you do not have a mask, one will be given to you upon arrival. For doctor visits, patients may have 1 support person aged 65 or older with them. For treatment visits, patients cannot have anyone with them due to current Covid guidelines and our immunocompromised population.

## 2021-07-12 ENCOUNTER — Inpatient Hospital Stay: Payer: Medicare Other

## 2021-07-12 VITALS — BP 135/56 | HR 77 | Temp 98.1°F | Resp 18

## 2021-07-12 DIAGNOSIS — C9500 Acute leukemia of unspecified cell type not having achieved remission: Secondary | ICD-10-CM

## 2021-07-12 DIAGNOSIS — C9200 Acute myeloblastic leukemia, not having achieved remission: Secondary | ICD-10-CM | POA: Diagnosis not present

## 2021-07-12 MED ORDER — HEPARIN SOD (PORK) LOCK FLUSH 100 UNIT/ML IV SOLN
500.0000 [IU] | Freq: Once | INTRAVENOUS | Status: AC | PRN
  Administered 2021-07-12: 500 [IU]

## 2021-07-12 MED ORDER — SODIUM CHLORIDE 0.9 % IV SOLN
10.0000 mg | Freq: Once | INTRAVENOUS | Status: AC
  Administered 2021-07-12: 10 mg via INTRAVENOUS
  Filled 2021-07-12: qty 10

## 2021-07-12 MED ORDER — SODIUM CHLORIDE 0.9% FLUSH
10.0000 mL | INTRAVENOUS | Status: DC | PRN
  Administered 2021-07-12: 10 mL

## 2021-07-12 MED ORDER — SODIUM CHLORIDE 0.9 % IV SOLN
49.5000 mg/m2 | Freq: Once | INTRAVENOUS | Status: AC
  Administered 2021-07-12: 100 mg via INTRAVENOUS
  Filled 2021-07-12: qty 10

## 2021-07-12 MED ORDER — SODIUM CHLORIDE 0.9 % IV SOLN: Freq: Once | INTRAVENOUS | Status: AC

## 2021-07-12 NOTE — Patient Instructions (Signed)
[facility]  Discharge Instructions: Thank you for choosing [facility] to provide your oncology and hematology care.   If you have a lab appointment with the [facility], please go directly to the [facility] and check in at the registration area.  Wear comfortable clothing and clothing appropriate for easy access to any Portacath or PICC line.   We strive to give you quality time with your provider. You may need to reschedule your appointment if you arrive late (15 or more minutes).  Arriving late affects you and other patients whose appointments are after yours.  Also, if you miss three or more appointments without notifying the office, you may be dismissed from the clinic at the provider's discretion.      For prescription refill requests, have your pharmacy contact our office and allow 72 hours for refills to be completed.    Today you received the following chemotherapy and/or immunotherapy agents Azacytidine      To help prevent nausea and vomiting after your treatment, we encourage you to take your nausea medication as directed.  BELOW ARE SYMPTOMS THAT SHOULD BE REPORTED IMMEDIATELY: *FEVER GREATER THAN 100.4 F (38 C) OR HIGHER *CHILLS OR SWEATING *NAUSEA AND VOMITING THAT IS NOT CONTROLLED WITH YOUR NAUSEA MEDICATION *UNUSUAL SHORTNESS OF BREATH *UNUSUAL BRUISING OR BLEEDING *URINARY PROBLEMS (pain or burning when urinating, or frequent urination) *BOWEL PROBLEMS (unusual diarrhea, constipation, pain near the anus) TENDERNESS IN MOUTH AND THROAT WITH OR WITHOUT PRESENCE OF ULCERS (sore throat, sores in mouth, or a toothache) UNUSUAL RASH, SWELLING OR PAIN  UNUSUAL VAGINAL DISCHARGE OR ITCHING   Items with * indicate a potential emergency and should be followed up as soon as possible or go to the Emergency Department if any problems should occur.  Please show the CHEMOTHERAPY ALERT CARD or IMMUNOTHERAPY ALERT CARD at check-in to the  Emergency Department and triage nurse. Should you have questions after your visit or need to cancel or reschedule your appointment, please contact [facility]  [phone] and follow the prompts.  Office hours are 8:00 a.m. to 4:30 p.m. Monday - Friday. Please note that voicemails left after 4:00 p.m. may not be returned until the following business day.  We are closed weekends and major holidays. You have access to a nurse at all times for urgent questions. Please call the main number to the clinic [phone] and follow the prompts.  For any non-urgent questions, you may also contact your provider using MyChart. We now offer e-Visits for anyone 55 and older to request care online for non-urgent symptoms. For details visit [email or web address].   Also download the MyChart app! Go to the app store, search "MyChart", open the app, select [facility], and log in with your MyChart username and password.  Due to Covid, a mask is required upon entering the hospital/clinic. If you do not have a mask, one will be given to you upon arrival. For doctor visits, patients may have 1 support person aged 10 or older with them. For treatment visits, patients cannot have anyone with them due to current Covid guidelines and our immunocompromised population.

## 2021-07-13 ENCOUNTER — Inpatient Hospital Stay: Payer: Medicare Other

## 2021-07-13 ENCOUNTER — Other Ambulatory Visit: Payer: Self-pay

## 2021-07-13 VITALS — BP 127/51 | HR 70 | Temp 97.9°F | Resp 18

## 2021-07-13 DIAGNOSIS — C9500 Acute leukemia of unspecified cell type not having achieved remission: Secondary | ICD-10-CM

## 2021-07-13 DIAGNOSIS — C9200 Acute myeloblastic leukemia, not having achieved remission: Secondary | ICD-10-CM | POA: Diagnosis not present

## 2021-07-13 MED ORDER — SODIUM CHLORIDE 0.9 % IV SOLN
49.5000 mg/m2 | Freq: Once | INTRAVENOUS | Status: AC
  Administered 2021-07-13: 100 mg via INTRAVENOUS
  Filled 2021-07-13: qty 10

## 2021-07-13 MED ORDER — SODIUM CHLORIDE 0.9 % IV SOLN
10.0000 mg | Freq: Once | INTRAVENOUS | Status: AC
  Administered 2021-07-13: 10 mg via INTRAVENOUS
  Filled 2021-07-13: qty 10

## 2021-07-13 MED ORDER — PALONOSETRON HCL INJECTION 0.25 MG/5ML
0.2500 mg | Freq: Once | INTRAVENOUS | Status: AC
  Administered 2021-07-13: 0.25 mg via INTRAVENOUS
  Filled 2021-07-13: qty 5

## 2021-07-13 MED ORDER — HEPARIN SOD (PORK) LOCK FLUSH 100 UNIT/ML IV SOLN
500.0000 [IU] | Freq: Once | INTRAVENOUS | Status: AC | PRN
  Administered 2021-07-13: 500 [IU]

## 2021-07-13 MED ORDER — SODIUM CHLORIDE 0.9% FLUSH
10.0000 mL | INTRAVENOUS | Status: DC | PRN
  Administered 2021-07-13: 10 mL

## 2021-07-13 MED ORDER — SODIUM CHLORIDE 0.9 % IV SOLN: Freq: Once | INTRAVENOUS | Status: AC

## 2021-07-13 NOTE — Patient Instructions (Signed)
[facility]  Discharge Instructions: Thank you for choosing [facility] to provide your oncology and hematology care.   If you have a lab appointment with the [facility], please go directly to the [facility] and check in at the registration area.  Wear comfortable clothing and clothing appropriate for easy access to any Portacath or PICC line.   We strive to give you quality time with your provider. You may need to reschedule your appointment if you arrive late (15 or more minutes).  Arriving late affects you and other patients whose appointments are after yours.  Also, if you miss three or more appointments without notifying the office, you may be dismissed from the clinic at the provider's discretion.      For prescription refill requests, have your pharmacy contact our office and allow 72 hours for refills to be completed.    Today you received the following chemotherapy and/or immunotherapy agents aloxi, decaddron, vidaza     To help prevent nausea and vomiting after your treatment, we encourage you to take your nausea medication as directed.  BELOW ARE SYMPTOMS THAT SHOULD BE REPORTED IMMEDIATELY: *FEVER GREATER THAN 100.4 F (38 C) OR HIGHER *CHILLS OR SWEATING *NAUSEA AND VOMITING THAT IS NOT CONTROLLED WITH YOUR NAUSEA MEDICATION *UNUSUAL SHORTNESS OF BREATH *UNUSUAL BRUISING OR BLEEDING *URINARY PROBLEMS (pain or burning when urinating, or frequent urination) *BOWEL PROBLEMS (unusual diarrhea, constipation, pain near the anus) TENDERNESS IN MOUTH AND THROAT WITH OR WITHOUT PRESENCE OF ULCERS (sore throat, sores in mouth, or a toothache) UNUSUAL RASH, SWELLING OR PAIN  UNUSUAL VAGINAL DISCHARGE OR ITCHING   Items with * indicate a potential emergency and should be followed up as soon as possible or go to the Emergency Department if any problems should occur.  Please show the CHEMOTHERAPY ALERT CARD or IMMUNOTHERAPY ALERT CARD at  check-in to the Emergency Department and triage nurse. Should you have questions after your visit or need to cancel or reschedule your appointment, please contact [facility]  [phone] and follow the prompts.  Office hours are 8:00 a.m. to 4:30 p.m. Monday - Friday. Please note that voicemails left after 4:00 p.m. may not be returned until the following business day.  We are closed weekends and major holidays. You have access to a nurse at all times for urgent questions. Please call the main number to the clinic [phone] and follow the prompts.  For any non-urgent questions, you may also contact your provider using MyChart. We now offer e-Visits for anyone 81 and older to request care online for non-urgent symptoms. For details visit [email or web address].   Also download the MyChart app! Go to the app store, search "MyChart", open the app, select [facility], and log in with your MyChart username and password.  Due to Covid, a mask is required upon entering the hospital/clinic. If you do not have a mask, one will be given to you upon arrival. For doctor visits, patients may have 1 support person aged 63 or older with them. For treatment visits, patients cannot have anyone with them due to current Covid guidelines and our immunocompromised population.

## 2021-07-14 ENCOUNTER — Inpatient Hospital Stay: Payer: Medicare Other

## 2021-07-14 ENCOUNTER — Other Ambulatory Visit: Payer: Self-pay | Admitting: *Deleted

## 2021-07-14 VITALS — BP 132/65 | HR 78 | Temp 98.0°F | Resp 16

## 2021-07-14 DIAGNOSIS — C9500 Acute leukemia of unspecified cell type not having achieved remission: Secondary | ICD-10-CM

## 2021-07-14 DIAGNOSIS — C9501 Acute leukemia of unspecified cell type, in remission: Secondary | ICD-10-CM

## 2021-07-14 DIAGNOSIS — C9200 Acute myeloblastic leukemia, not having achieved remission: Secondary | ICD-10-CM | POA: Diagnosis not present

## 2021-07-14 DIAGNOSIS — Z95828 Presence of other vascular implants and grafts: Secondary | ICD-10-CM

## 2021-07-14 MED ORDER — HEPARIN SOD (PORK) LOCK FLUSH 100 UNIT/ML IV SOLN
500.0000 [IU] | Freq: Once | INTRAVENOUS | Status: AC | PRN
Start: 2021-07-14 — End: 2021-07-14
  Administered 2021-07-14: 500 [IU]

## 2021-07-14 MED ORDER — SODIUM CHLORIDE 0.9 % IV SOLN: Freq: Once | INTRAVENOUS | Status: AC

## 2021-07-14 MED ORDER — SODIUM CHLORIDE 0.9% FLUSH
10.0000 mL | INTRAVENOUS | Status: DC | PRN
  Administered 2021-07-14: 10 mL

## 2021-07-14 MED ORDER — SODIUM CHLORIDE 0.9 % IV SOLN
10.0000 mg | Freq: Once | INTRAVENOUS | Status: AC
  Administered 2021-07-14: 10 mg via INTRAVENOUS
  Filled 2021-07-14: qty 10

## 2021-07-14 MED ORDER — SODIUM CHLORIDE 0.9 % IV SOLN
49.0000 mg/m2 | Freq: Once | INTRAVENOUS | Status: AC
  Administered 2021-07-14: 100 mg via INTRAVENOUS
  Filled 2021-07-14: qty 10

## 2021-07-14 NOTE — Patient Instructions (Signed)
[facility]  Discharge Instructions: Thank you for choosing [facility] to provide your oncology and hematology care.   If you have a lab appointment with the [facility], please go directly to the [facility] and check in at the registration area.  Wear comfortable clothing and clothing appropriate for easy access to any Portacath or PICC line.   We strive to give you quality time with your provider. You may need to reschedule your appointment if you arrive late (15 or more minutes).  Arriving late affects you and other patients whose appointments are after yours.  Also, if you miss three or more appointments without notifying the office, you may be dismissed from the clinic at the provider's discretion.      For prescription refill requests, have your pharmacy contact our office and allow 72 hours for refills to be completed.    Today you received the following chemotherapy and/or immunotherapy agents aloxi, decaddron, vidaza     To help prevent nausea and vomiting after your treatment, we encourage you to take your nausea medication as directed.  BELOW ARE SYMPTOMS THAT SHOULD BE REPORTED IMMEDIATELY: *FEVER GREATER THAN 100.4 F (38 C) OR HIGHER *CHILLS OR SWEATING *NAUSEA AND VOMITING THAT IS NOT CONTROLLED WITH YOUR NAUSEA MEDICATION *UNUSUAL SHORTNESS OF BREATH *UNUSUAL BRUISING OR BLEEDING *URINARY PROBLEMS (pain or burning when urinating, or frequent urination) *BOWEL PROBLEMS (unusual diarrhea, constipation, pain near the anus) TENDERNESS IN MOUTH AND THROAT WITH OR WITHOUT PRESENCE OF ULCERS (sore throat, sores in mouth, or a toothache) UNUSUAL RASH, SWELLING OR PAIN  UNUSUAL VAGINAL DISCHARGE OR ITCHING   Items with * indicate a potential emergency and should be followed up as soon as possible or go to the Emergency Department if any problems should occur.  Please show the CHEMOTHERAPY ALERT CARD or IMMUNOTHERAPY ALERT CARD at  check-in to the Emergency Department and triage nurse. Should you have questions after your visit or need to cancel or reschedule your appointment, please contact [facility]  [phone] and follow the prompts.  Office hours are 8:00 a.m. to 4:30 p.m. Monday - Friday. Please note that voicemails left after 4:00 p.m. may not be returned until the following business day.  We are closed weekends and major holidays. You have access to a nurse at all times for urgent questions. Please call the main number to the clinic [phone] and follow the prompts.  For any non-urgent questions, you may also contact your provider using MyChart. We now offer e-Visits for anyone 65 and older to request care online for non-urgent symptoms. For details visit [email or web address].   Also download the MyChart app! Go to the app store, search "MyChart", open the app, select [facility], and log in with your MyChart username and password.  Due to Covid, a mask is required upon entering the hospital/clinic. If you do not have a mask, one will be given to you upon arrival. For doctor visits, patients may have 1 support person aged 13 or older with them. For treatment visits, patients cannot have anyone with them due to current Covid guidelines and our immunocompromised population.

## 2021-07-17 ENCOUNTER — Telehealth: Payer: Self-pay

## 2021-07-17 ENCOUNTER — Inpatient Hospital Stay: Payer: Medicare Other

## 2021-07-17 ENCOUNTER — Ambulatory Visit (HOSPITAL_BASED_OUTPATIENT_CLINIC_OR_DEPARTMENT_OTHER)
Admission: RE | Admit: 2021-07-17 | Discharge: 2021-07-17 | Disposition: A | Discharge status: Home / Self Care | Payer: Medicare Other | Source: Ambulatory Visit | Attending: Hematology & Oncology | Admitting: Hematology & Oncology

## 2021-07-17 ENCOUNTER — Other Ambulatory Visit: Payer: Medicare Other

## 2021-07-17 ENCOUNTER — Other Ambulatory Visit: Payer: Self-pay

## 2021-07-17 VITALS — BP 107/53 | HR 76 | Resp 18

## 2021-07-17 DIAGNOSIS — J449 Chronic obstructive pulmonary disease, unspecified: Secondary | ICD-10-CM | POA: Insufficient documentation

## 2021-07-17 DIAGNOSIS — C9501 Acute leukemia of unspecified cell type, in remission: Secondary | ICD-10-CM

## 2021-07-17 DIAGNOSIS — C9200 Acute myeloblastic leukemia, not having achieved remission: Secondary | ICD-10-CM | POA: Diagnosis not present

## 2021-07-17 DIAGNOSIS — C9500 Acute leukemia of unspecified cell type not having achieved remission: Secondary | ICD-10-CM

## 2021-07-17 DIAGNOSIS — Z95828 Presence of other vascular implants and grafts: Secondary | ICD-10-CM

## 2021-07-17 LAB — CBC WITH DIFFERENTIAL (CANCER CENTER ONLY)
Abs Immature Granulocytes: 0.14 10*3/uL — ABNORMAL HIGH (ref 0.00–0.07)
Basophils Absolute: 0 10*3/uL (ref 0.0–0.1)
Basophils Relative: 0 %
Eosinophils Absolute: 0 10*3/uL (ref 0.0–0.5)
Eosinophils Relative: 0 %
HCT: 35.7 % — ABNORMAL LOW (ref 39.0–52.0)
Hemoglobin: 11.4 g/dL — ABNORMAL LOW (ref 13.0–17.0)
Immature Granulocytes: 3 %
Lymphocytes Relative: 19 %
Lymphs Abs: 1 10*3/uL (ref 0.7–4.0)
MCH: 29.8 pg (ref 26.0–34.0)
MCHC: 31.9 g/dL (ref 30.0–36.0)
MCV: 93.2 fL (ref 80.0–100.0)
Monocytes Absolute: 0.3 10*3/uL (ref 0.1–1.0)
Monocytes Relative: 6 %
Neutro Abs: 3.8 10*3/uL (ref 1.7–7.7)
Neutrophils Relative %: 72 %
Platelet Count: 67 10*3/uL — ABNORMAL LOW (ref 150–400)
RBC: 3.83 MIL/uL — ABNORMAL LOW (ref 4.22–5.81)
RDW: 19.2 % — ABNORMAL HIGH (ref 11.5–15.5)
WBC Count: 5.2 10*3/uL (ref 4.0–10.5)
nRBC: 0.6 % — ABNORMAL HIGH (ref 0.0–0.2)

## 2021-07-17 LAB — CMP (CANCER CENTER ONLY)
ALT: 12 U/L (ref 0–44)
AST: 11 U/L — ABNORMAL LOW (ref 15–41)
Albumin: 3.5 g/dL (ref 3.5–5.0)
Alkaline Phosphatase: 46 U/L (ref 38–126)
Anion gap: 8 (ref 5–15)
BUN: 21 mg/dL (ref 8–23)
CO2: 28 mmol/L (ref 22–32)
Calcium: 8.1 mg/dL — ABNORMAL LOW (ref 8.9–10.3)
Chloride: 105 mmol/L (ref 98–111)
Creatinine: 1.09 mg/dL (ref 0.61–1.24)
GFR, Estimated: >60 mL/min (ref >60)
Glucose, Bld: 101 mg/dL — ABNORMAL HIGH (ref 70–99)
Potassium: 3.6 mmol/L (ref 3.5–5.1)
Sodium: 141 mmol/L (ref 135–145)
Total Bilirubin: 0.8 mg/dL (ref 0.3–1.2)
Total Protein: 4.7 g/dL — ABNORMAL LOW (ref 6.5–8.1)

## 2021-07-17 LAB — MAGNESIUM: Magnesium: 2 mg/dL (ref 1.7–2.4)

## 2021-07-17 MED ORDER — SODIUM CHLORIDE 0.9% FLUSH
10.0000 mL | INTRAVENOUS | Status: DC | PRN
Start: 2021-07-17 — End: 2021-07-17
  Administered 2021-07-17: 10 mL

## 2021-07-17 MED ORDER — PALONOSETRON HCL INJECTION 0.25 MG/5ML
0.2500 mg | Freq: Once | INTRAVENOUS | Status: AC
  Administered 2021-07-17: 0.25 mg via INTRAVENOUS
  Filled 2021-07-17: qty 5

## 2021-07-17 MED ORDER — SODIUM CHLORIDE 0.9 % IV SOLN: Freq: Once | INTRAVENOUS | Status: AC

## 2021-07-17 MED ORDER — SODIUM CHLORIDE 0.9 % IV SOLN
49.5000 mg/m2 | Freq: Once | INTRAVENOUS | Status: AC
  Administered 2021-07-17: 100 mg via INTRAVENOUS
  Filled 2021-07-17: qty 10

## 2021-07-17 MED ORDER — DEXAMETHASONE SODIUM PHOSPHATE 100 MG/10ML IJ SOLN
10.0000 mg | Freq: Once | INTRAMUSCULAR | Status: AC
  Administered 2021-07-17: 10 mg via INTRAVENOUS
  Filled 2021-07-17: qty 10

## 2021-07-17 MED ORDER — HEPARIN SOD (PORK) LOCK FLUSH 100 UNIT/ML IV SOLN
500.0000 [IU] | Freq: Once | INTRAVENOUS | Status: AC | PRN
  Administered 2021-07-17: 500 [IU]

## 2021-07-17 NOTE — Telephone Encounter (Signed)
Called and informed patient of results, he verbalized understanding and denies any questions or concerns at this time.

## 2021-07-17 NOTE — Patient Instructions (Signed)
Implanted Port Home Guide An implanted port is a device that is placed under the skin. It is usually placed in the chest. The device can be used to give IV medicine, to take blood, or for dialysis. You may have an implanted port if: You need IV medicine that would be irritating to the small veins in your hands or arms. You need IV medicines, such as antibiotics, for a long period of time. You need IV nutrition for a long period of time. You need dialysis. When you have a port, your health care provider can choose to use the port instead of veins in your arms for these procedures. You may have fewer limitations when using a port than you would if you used other types of long-term IVs, and you will likely be able to return to normal activities after your incision heals. An implanted port has two main parts: Reservoir. The reservoir is the part where a needle is inserted to give medicines or draw blood. The reservoir is round. After it is placed, it appears as a small, raised area under your skin. Catheter. The catheter is a thin, flexible tube that connects the reservoir to a vein. Medicine that is inserted into the reservoir goes into the catheter and then into the vein. How is my port accessed? To access your port: A numbing cream may be placed on the skin over the port site. Your health care provider will put on a mask and sterile gloves. The skin over your port will be cleaned carefully with a germ-killing soap and allowed to dry. Your health care provider will gently pinch the port and insert a needle into it. Your health care provider will check for a blood return to make sure the port is in the vein and is not clogged. If your port needs to remain accessed to get medicine continuously (constant infusion), your health care provider will place a clear bandage (dressing) over the needle site. The dressing and needle will need to be changed every week, or as told by your health care provider. What  is flushing? Flushing helps keep the port from getting clogged. Follow instructions from your health care provider about how and when to flush the port. Ports are usually flushed with saline solution or a medicine called heparin. The need for flushing will depend on how the port is used: If the port is only used from time to time to give medicines or draw blood, the port may need to be flushed: Before and after medicines have been given. Before and after blood has been drawn. As part of routine maintenance. Flushing may be recommended every 4-6 weeks. If a constant infusion is running, the port may not need to be flushed. Throw away any syringes in a disposal container that is meant for sharp items (sharps container). You can buy a sharps container from a pharmacy, or you can make one by using an empty hard plastic bottle with a cover. How long will my port stay implanted? The port can stay in for as long as your health care provider thinks it is needed. When it is time for the port to come out, a surgery will be done to remove it. The surgery will be similar to the procedure that was done to put the port in. Follow these instructions at home:  Flush your port as told by your health care provider. If you need an infusion over several days, follow instructions from your health care provider about how   to take care of your port site. Make sure you: Wash your hands with soap and water before you change your dressing. If soap and water are not available, use alcohol-based hand sanitizer. Change your dressing as told by your health care provider. Place any used dressings or infusion bags into a plastic bag. Throw that bag in the trash. Keep the dressing that covers the needle clean and dry. Do not get it wet. Do not use scissors or sharp objects near the tube. Keep the tube clamped, unless it is being used. Check your port site every day for signs of infection. Check for: Redness, swelling, or  pain. Fluid or blood. Pus or a bad smell. Protect the skin around the port site. Avoid wearing bra straps that rub or irritate the site. Protect the skin around your port from seat belts. Place a soft pad over your chest if needed. Bathe or shower as told by your health care provider. The site may get wet as long as you are not actively receiving an infusion. Return to your normal activities as told by your health care provider. Ask your health care provider what activities are safe for you. Carry a medical alert card or wear a medical alert bracelet at all times. This will let health care providers know that you have an implanted port in case of an emergency. Get help right away if: You have redness, swelling, or pain at the port site. You have fluid or blood coming from your port site. You have pus or a bad smell coming from the port site. You have a fever. Summary Implanted ports are usually placed in the chest for long-term IV access. Follow instructions from your health care provider about flushing the port and changing bandages (dressings). Take care of the area around your port by avoiding clothing that puts pressure on the area, and by watching for signs of infection. Protect the skin around your port from seat belts. Place a soft pad over your chest if needed. Get help right away if you have a fever or you have redness, swelling, pain, drainage, or a bad smell at the port site. This information is not intended to replace advice given to you by your health care provider. Make sure you discuss any questions you have with your health care provider. Document Revised: 01/11/2021 Document Reviewed: 03/07/2020 Elsevier Patient Education  2022 [employer]  

## 2021-07-17 NOTE — Progress Notes (Signed)
Ok to treat with platelets of 67 per Judson Roch, NP. dph

## 2021-07-17 NOTE — Telephone Encounter (Signed)
-----   Message from Volanda Napoleon, MD sent at 07/17/2021  1:30 PM EDT ----- Call - the blood clot is gone!!!  The CXR shows changes that look like healing from the pneumonia that he had.   Please make sure that [facility] Linus [facility]n at [facility] gets these results!!!  Thanks!!!  Laurey Arrow

## 2021-07-19 ENCOUNTER — Other Ambulatory Visit: Payer: Self-pay

## 2021-07-19 ENCOUNTER — Telehealth: Payer: Self-pay

## 2021-07-19 DIAGNOSIS — N138 Other obstructive and reflux uropathy: Secondary | ICD-10-CM

## 2021-07-19 NOTE — Telephone Encounter (Signed)
Refill request for: Doxazosin 2 mg  LR 02/26/20, #90, 3 rfs LOV  06/06/21 FOV none scheduled.    Please review and advise.  Thanks. Dm/cma

## 2021-07-19 NOTE — Telephone Encounter (Signed)
Patient called stating he wanted [facility] to come pick up his oxygen but they informed him it had to be cancelled by his physician.  Called [employer] [facility] and they requested a letter that states patient no longer needs oxygen faxed to [phone]. Letter completed and faxed. They will arrange pick up with pt.

## 2021-07-20 LAB — HM DIABETES EYE EXAM

## 2021-07-20 MED ORDER — DOXAZOSIN MESYLATE 2 MG PO TABS: 2.0000 mg | ORAL_TABLET | Freq: Every day | ORAL | 3 refills | Status: AC

## 2021-07-21 ENCOUNTER — Encounter: Payer: Self-pay | Admitting: Family Medicine

## 2021-07-24 ENCOUNTER — Other Ambulatory Visit: Payer: Self-pay

## 2021-07-24 ENCOUNTER — Inpatient Hospital Stay: Payer: Medicare Other

## 2021-07-24 DIAGNOSIS — G629 Polyneuropathy, unspecified: Secondary | ICD-10-CM

## 2021-07-24 DIAGNOSIS — C9200 Acute myeloblastic leukemia, not having achieved remission: Secondary | ICD-10-CM | POA: Diagnosis not present

## 2021-07-24 DIAGNOSIS — C9501 Acute leukemia of unspecified cell type, in remission: Secondary | ICD-10-CM

## 2021-07-24 LAB — CMP (CANCER CENTER ONLY)
ALT: 9 U/L (ref 0–44)
AST: 10 U/L — ABNORMAL LOW (ref 15–41)
Albumin: 3.6 g/dL (ref 3.5–5.0)
Alkaline Phosphatase: 49 U/L (ref 38–126)
Anion gap: 7 (ref 5–15)
BUN: 13 mg/dL (ref 8–23)
CO2: 27 mmol/L (ref 22–32)
Calcium: 8.3 mg/dL — ABNORMAL LOW (ref 8.9–10.3)
Chloride: 107 mmol/L (ref 98–111)
Creatinine: 1.03 mg/dL (ref 0.61–1.24)
GFR, Estimated: >60 mL/min (ref >60)
Glucose, Bld: 133 mg/dL — ABNORMAL HIGH (ref 70–99)
Potassium: 3.5 mmol/L (ref 3.5–5.1)
Sodium: 141 mmol/L (ref 135–145)
Total Bilirubin: 0.6 mg/dL (ref 0.3–1.2)
Total Protein: 5.3 g/dL — ABNORMAL LOW (ref 6.5–8.1)

## 2021-07-24 LAB — CBC WITH DIFFERENTIAL (CANCER CENTER ONLY)
Abs Immature Granulocytes: 0.01 10*3/uL (ref 0.00–0.07)
Basophils Absolute: 0 10*3/uL (ref 0.0–0.1)
Basophils Relative: 1 %
Eosinophils Absolute: 0 10*3/uL (ref 0.0–0.5)
Eosinophils Relative: 0 %
HCT: 35.1 % — ABNORMAL LOW (ref 39.0–52.0)
Hemoglobin: 11.1 g/dL — ABNORMAL LOW (ref 13.0–17.0)
Immature Granulocytes: 1 %
Lymphocytes Relative: 38 %
Lymphs Abs: 0.8 10*3/uL (ref 0.7–4.0)
MCH: 29.8 pg (ref 26.0–34.0)
MCHC: 31.6 g/dL (ref 30.0–36.0)
MCV: 94.1 fL (ref 80.0–100.0)
Monocytes Absolute: 0.1 10*3/uL (ref 0.1–1.0)
Monocytes Relative: 7 %
Neutro Abs: 1.2 10*3/uL — ABNORMAL LOW (ref 1.7–7.7)
Neutrophils Relative %: 53 %
Platelet Count: 39 10*3/uL — ABNORMAL LOW (ref 150–400)
RBC: 3.73 MIL/uL — ABNORMAL LOW (ref 4.22–5.81)
RDW: 19.3 % — ABNORMAL HIGH (ref 11.5–15.5)
WBC Count: 2.2 10*3/uL — ABNORMAL LOW (ref 4.0–10.5)
nRBC: 0 % (ref 0.0–0.2)

## 2021-07-24 LAB — LACTATE DEHYDROGENASE: LDH: 162 U/L (ref 98–192)

## 2021-07-24 LAB — SAVE SMEAR(SSMR), FOR PROVIDER SLIDE REVIEW

## 2021-07-24 MED ORDER — GABAPENTIN 300 MG PO CAPS: 300.0000 mg | ORAL_CAPSULE | Freq: Three times a day (TID) | ORAL | 1 refills | Status: DC

## 2021-07-24 NOTE — Progress Notes (Signed)
Patient complains of ongoing  numbness and tingling in hands and feet, states started a year ago and is worsening. Discussed with Dr.Ennever who gave verbal order for Neurontin 300mg  TID. Patient informed and sent to pharmacy. Patient denies any other questions or concerns at this time.

## 2021-07-24 NOTE — Patient Instructions (Signed)
Implanted Port Home Guide An implanted port is a device that is placed under the skin. It is usually placed in the chest. The device can be used to give IV medicine, to take blood, or for dialysis. You may have an implanted port if: You need IV medicine that would be irritating to the small veins in your hands or arms. You need IV medicines, such as antibiotics, for a long period of time. You need IV nutrition for a long period of time. You need dialysis. When you have a port, your health care provider can choose to use the port instead of veins in your arms for these procedures. You may have fewer limitations when using a port than you would if you used other types of long-term IVs, and you will likely be able to return to normal activities after your incision heals. An implanted port has two main parts: Reservoir. The reservoir is the part where a needle is inserted to give medicines or draw blood. The reservoir is round. After it is placed, it appears as a small, raised area under your skin. Catheter. The catheter is a thin, flexible tube that connects the reservoir to a vein. Medicine that is inserted into the reservoir goes into the catheter and then into the vein. How is my port accessed? To access your port: A numbing cream may be placed on the skin over the port site. Your health care provider will put on a mask and sterile gloves. The skin over your port will be cleaned carefully with a germ-killing soap and allowed to dry. Your health care provider will gently pinch the port and insert a needle into it. Your health care provider will check for a blood return to make sure the port is in the vein and is not clogged. If your port needs to remain accessed to get medicine continuously (constant infusion), your health care provider will place a clear bandage (dressing) over the needle site. The dressing and needle will need to be changed every week, or as told by your health care provider. What  is flushing? Flushing helps keep the port from getting clogged. Follow instructions from your health care provider about how and when to flush the port. Ports are usually flushed with saline solution or a medicine called heparin. The need for flushing will depend on how the port is used: If the port is only used from time to time to give medicines or draw blood, the port may need to be flushed: Before and after medicines have been given. Before and after blood has been drawn. As part of routine maintenance. Flushing may be recommended every 4-6 weeks. If a constant infusion is running, the port may not need to be flushed. Throw away any syringes in a disposal container that is meant for sharp items (sharps container). You can buy a sharps container from a pharmacy, or you can make one by using an empty hard plastic bottle with a cover. How long will my port stay implanted? The port can stay in for as long as your health care provider thinks it is needed. When it is time for the port to come out, a surgery will be done to remove it. The surgery will be similar to the procedure that was done to put the port in. Follow these instructions at home:  Flush your port as told by your health care provider. If you need an infusion over several days, follow instructions from your health care provider about how   to take care of your port site. Make sure you: Wash your hands with soap and water before you change your dressing. If soap and water are not available, use alcohol-based hand sanitizer. Change your dressing as told by your health care provider. Place any used dressings or infusion bags into a plastic bag. Throw that bag in the trash. Keep the dressing that covers the needle clean and dry. Do not get it wet. Do not use scissors or sharp objects near the tube. Keep the tube clamped, unless it is being used. Check your port site every day for signs of infection. Check for: Redness, swelling, or  pain. Fluid or blood. Pus or a bad smell. Protect the skin around the port site. Avoid wearing bra straps that rub or irritate the site. Protect the skin around your port from seat belts. Place a soft pad over your chest if needed. Bathe or shower as told by your health care provider. The site may get wet as long as you are not actively receiving an infusion. Return to your normal activities as told by your health care provider. Ask your health care provider what activities are safe for you. Carry a medical alert card or wear a medical alert bracelet at all times. This will let health care providers know that you have an implanted port in case of an emergency. Get help right away if: You have redness, swelling, or pain at the port site. You have fluid or blood coming from your port site. You have pus or a bad smell coming from the port site. You have a fever. Summary Implanted ports are usually placed in the chest for long-term IV access. Follow instructions from your health care provider about flushing the port and changing bandages (dressings). Take care of the area around your port by avoiding clothing that puts pressure on the area, and by watching for signs of infection. Protect the skin around your port from seat belts. Place a soft pad over your chest if needed. Get help right away if you have a fever or you have redness, swelling, pain, drainage, or a bad smell at the port site. This information is not intended to replace advice given to you by your health care provider. Make sure you discuss any questions you have with your health care provider. Document Revised: 01/11/2021 Document Reviewed: 03/07/2020 Elsevier Patient Education  2022 [employer]  

## 2021-07-28 ENCOUNTER — Other Ambulatory Visit: Payer: Self-pay | Admitting: *Deleted

## 2021-07-28 DIAGNOSIS — G629 Polyneuropathy, unspecified: Secondary | ICD-10-CM

## 2021-07-28 DIAGNOSIS — C9500 Acute leukemia of unspecified cell type not having achieved remission: Secondary | ICD-10-CM

## 2021-07-31 ENCOUNTER — Other Ambulatory Visit: Payer: Self-pay

## 2021-07-31 ENCOUNTER — Inpatient Hospital Stay: Payer: Medicare Other

## 2021-07-31 ENCOUNTER — Ambulatory Visit: Payer: Medicare Other | Attending: Internal Medicine

## 2021-07-31 DIAGNOSIS — Z95828 Presence of other vascular implants and grafts: Secondary | ICD-10-CM

## 2021-07-31 DIAGNOSIS — C9200 Acute myeloblastic leukemia, not having achieved remission: Secondary | ICD-10-CM | POA: Diagnosis not present

## 2021-07-31 DIAGNOSIS — C9500 Acute leukemia of unspecified cell type not having achieved remission: Secondary | ICD-10-CM

## 2021-07-31 DIAGNOSIS — Z23 Encounter for immunization: Secondary | ICD-10-CM

## 2021-07-31 DIAGNOSIS — G629 Polyneuropathy, unspecified: Secondary | ICD-10-CM

## 2021-07-31 LAB — CBC WITH DIFFERENTIAL (CANCER CENTER ONLY)
Abs Immature Granulocytes: 0.02 10*3/uL (ref 0.00–0.07)
Basophils Absolute: 0 10*3/uL (ref 0.0–0.1)
Basophils Relative: 1 %
Eosinophils Absolute: 0 10*3/uL (ref 0.0–0.5)
Eosinophils Relative: 0 %
HCT: 32.7 % — ABNORMAL LOW (ref 39.0–52.0)
Hemoglobin: 10.6 g/dL — ABNORMAL LOW (ref 13.0–17.0)
Immature Granulocytes: 1 %
Lymphocytes Relative: 58 %
Lymphs Abs: 1.1 10*3/uL (ref 0.7–4.0)
MCH: 29.9 pg (ref 26.0–34.0)
MCHC: 32.4 g/dL (ref 30.0–36.0)
MCV: 92.4 fL (ref 80.0–100.0)
Monocytes Absolute: 0.1 10*3/uL (ref 0.1–1.0)
Monocytes Relative: 6 %
Neutro Abs: 0.6 10*3/uL — ABNORMAL LOW (ref 1.7–7.7)
Neutrophils Relative %: 34 %
Platelet Count: 63 10*3/uL — ABNORMAL LOW (ref 150–400)
RBC: 3.54 MIL/uL — ABNORMAL LOW (ref 4.22–5.81)
RDW: 18.8 % — ABNORMAL HIGH (ref 11.5–15.5)
WBC Count: 1.8 10*3/uL — ABNORMAL LOW (ref 4.0–10.5)
nRBC: 0 % (ref 0.0–0.2)

## 2021-07-31 LAB — CMP (CANCER CENTER ONLY)
ALT: 13 U/L (ref 0–44)
AST: 12 U/L — ABNORMAL LOW (ref 15–41)
Albumin: 3.5 g/dL (ref 3.5–5.0)
Alkaline Phosphatase: 52 U/L (ref 38–126)
Anion gap: 7 (ref 5–15)
BUN: 13 mg/dL (ref 8–23)
CO2: 28 mmol/L (ref 22–32)
Calcium: 8.5 mg/dL — ABNORMAL LOW (ref 8.9–10.3)
Chloride: 106 mmol/L (ref 98–111)
Creatinine: 1.02 mg/dL (ref 0.61–1.24)
GFR, Estimated: >60 mL/min (ref >60)
Glucose, Bld: 123 mg/dL — ABNORMAL HIGH (ref 70–99)
Potassium: 3.7 mmol/L (ref 3.5–5.1)
Sodium: 141 mmol/L (ref 135–145)
Total Bilirubin: 0.4 mg/dL (ref 0.3–1.2)
Total Protein: 5.3 g/dL — ABNORMAL LOW (ref 6.5–8.1)

## 2021-07-31 LAB — MAGNESIUM: Magnesium: 2 mg/dL (ref 1.7–2.4)

## 2021-07-31 MED ORDER — HEPARIN SOD (PORK) LOCK FLUSH 100 UNIT/ML IV SOLN
500.0000 [IU] | Freq: Once | INTRAVENOUS | Status: AC
  Administered 2021-07-31: 500 [IU] via INTRAVENOUS

## 2021-07-31 MED ORDER — PROPRANOLOL HCL ER 80 MG PO CP24: 80.0000 mg | ORAL_CAPSULE | Freq: Every day | ORAL | 0 refills | Status: DC

## 2021-07-31 MED ORDER — SODIUM CHLORIDE 0.9% FLUSH
10.0000 mL | Freq: Once | INTRAVENOUS | Status: AC
  Administered 2021-07-31: 10 mL via INTRAVENOUS

## 2021-07-31 NOTE — Patient Instructions (Signed)
Implanted Port Home Guide An implanted port is a device that is placed under the skin. It is usually placed in the chest. The device can be used to give IV medicine, to take blood, or for dialysis. You may have an implanted port if: You need IV medicine that would be irritating to the small veins in your hands or arms. You need IV medicines, such as antibiotics, for a long period of time. You need IV nutrition for a long period of time. You need dialysis. When you have a port, your health care provider can choose to use the port instead of veins in your arms for these procedures. You may have fewer limitations when using a port than you would if you used other types of long-term IVs, and you will likely be able to return to normal activities after your incision heals. An implanted port has two main parts: Reservoir. The reservoir is the part where a needle is inserted to give medicines or draw blood. The reservoir is round. After it is placed, it appears as a small, raised area under your skin. Catheter. The catheter is a thin, flexible tube that connects the reservoir to a vein. Medicine that is inserted into the reservoir goes into the catheter and then into the vein. How is my port accessed? To access your port: A numbing cream may be placed on the skin over the port site. Your health care provider will put on a mask and sterile gloves. The skin over your port will be cleaned carefully with a germ-killing soap and allowed to dry. Your health care provider will gently pinch the port and insert a needle into it. Your health care provider will check for a blood return to make sure the port is in the vein and is not clogged. If your port needs to remain accessed to get medicine continuously (constant infusion), your health care provider will place a clear bandage (dressing) over the needle site. The dressing and needle will need to be changed every week, or as told by your health care provider. What  is flushing? Flushing helps keep the port from getting clogged. Follow instructions from your health care provider about how and when to flush the port. Ports are usually flushed with saline solution or a medicine called heparin. The need for flushing will depend on how the port is used: If the port is only used from time to time to give medicines or draw blood, the port may need to be flushed: Before and after medicines have been given. Before and after blood has been drawn. As part of routine maintenance. Flushing may be recommended every 4-6 weeks. If a constant infusion is running, the port may not need to be flushed. Throw away any syringes in a disposal container that is meant for sharp items (sharps container). You can buy a sharps container from a pharmacy, or you can make one by using an empty hard plastic bottle with a cover. How long will my port stay implanted? The port can stay in for as long as your health care provider thinks it is needed. When it is time for the port to come out, a surgery will be done to remove it. The surgery will be similar to the procedure that was done to put the port in. Follow these instructions at home:  Flush your port as told by your health care provider. If you need an infusion over several days, follow instructions from your health care provider about how   to take care of your port site. Make sure you: Wash your hands with soap and water before you change your dressing. If soap and water are not available, use alcohol-based hand sanitizer. Change your dressing as told by your health care provider. Place any used dressings or infusion bags into a plastic bag. Throw that bag in the trash. Keep the dressing that covers the needle clean and dry. Do not get it wet. Do not use scissors or sharp objects near the tube. Keep the tube clamped, unless it is being used. Check your port site every day for signs of infection. Check for: Redness, swelling, or  pain. Fluid or blood. Pus or a bad smell. Protect the skin around the port site. Avoid wearing bra straps that rub or irritate the site. Protect the skin around your port from seat belts. Place a soft pad over your chest if needed. Bathe or shower as told by your health care provider. The site may get wet as long as you are not actively receiving an infusion. Return to your normal activities as told by your health care provider. Ask your health care provider what activities are safe for you. Carry a medical alert card or wear a medical alert bracelet at all times. This will let health care providers know that you have an implanted port in case of an emergency. Get help right away if: You have redness, swelling, or pain at the port site. You have fluid or blood coming from your port site. You have pus or a bad smell coming from the port site. You have a fever. Summary Implanted ports are usually placed in the chest for long-term IV access. Follow instructions from your health care provider about flushing the port and changing bandages (dressings). Take care of the area around your port by avoiding clothing that puts pressure on the area, and by watching for signs of infection. Protect the skin around your port from seat belts. Place a soft pad over your chest if needed. Get help right away if you have a fever or you have redness, swelling, pain, drainage, or a bad smell at the port site. This information is not intended to replace advice given to you by your health care provider. Make sure you discuss any questions you have with your health care provider. Document Revised: 01/11/2021 Document Reviewed: 03/07/2020 Elsevier Patient Education  2022 [employer]  

## 2021-07-31 NOTE — Progress Notes (Signed)
   Covid-19 Vaccination Clinic  Name:  JOBANNY MAVIS    MRN: [id number] DOB: 08-01-1936  07/31/2021  Mr. Fricker was observed post Covid-19 immunization for 15 minutes without incident. He was provided with Vaccine Information Sheet and instruction to access the V-Safe system.   Mr. Dowson was instructed to call 911 with any severe reactions post vaccine: Difficulty breathing  Swelling of face and throat  A fast heartbeat  A bad rash all over body  Dizziness and weakness

## 2021-07-31 NOTE — Addendum Note (Signed)
Addended by: Tyler Aas A on: 07/31/2021 09:51 AM   Modules accepted: Orders

## 2021-08-07 ENCOUNTER — Inpatient Hospital Stay: Payer: Medicare Other | Attending: Oncology

## 2021-08-07 ENCOUNTER — Other Ambulatory Visit: Payer: Self-pay

## 2021-08-07 ENCOUNTER — Inpatient Hospital Stay: Payer: Medicare Other

## 2021-08-07 ENCOUNTER — Other Ambulatory Visit: Payer: Self-pay | Admitting: *Deleted

## 2021-08-07 ENCOUNTER — Other Ambulatory Visit (HOSPITAL_BASED_OUTPATIENT_CLINIC_OR_DEPARTMENT_OTHER): Payer: Self-pay

## 2021-08-07 ENCOUNTER — Encounter: Payer: Self-pay | Admitting: Oncology

## 2021-08-07 DIAGNOSIS — M549 Dorsalgia, unspecified: Secondary | ICD-10-CM | POA: Insufficient documentation

## 2021-08-07 DIAGNOSIS — C9501 Acute leukemia of unspecified cell type, in remission: Secondary | ICD-10-CM

## 2021-08-07 DIAGNOSIS — Z79899 Other long term (current) drug therapy: Secondary | ICD-10-CM | POA: Diagnosis not present

## 2021-08-07 DIAGNOSIS — I2699 Other pulmonary embolism without acute cor pulmonale: Secondary | ICD-10-CM | POA: Diagnosis not present

## 2021-08-07 DIAGNOSIS — C9200 Acute myeloblastic leukemia, not having achieved remission: Secondary | ICD-10-CM | POA: Insufficient documentation

## 2021-08-07 DIAGNOSIS — I82401 Acute embolism and thrombosis of unspecified deep veins of right lower extremity: Secondary | ICD-10-CM | POA: Diagnosis not present

## 2021-08-07 DIAGNOSIS — Z95828 Presence of other vascular implants and grafts: Secondary | ICD-10-CM

## 2021-08-07 DIAGNOSIS — G8929 Other chronic pain: Secondary | ICD-10-CM | POA: Insufficient documentation

## 2021-08-07 DIAGNOSIS — Z5111 Encounter for antineoplastic chemotherapy: Secondary | ICD-10-CM | POA: Insufficient documentation

## 2021-08-07 LAB — CBC WITH DIFFERENTIAL (CANCER CENTER ONLY)
Abs Immature Granulocytes: 0.04 10*3/uL (ref 0.00–0.07)
Basophils Absolute: 0 10*3/uL (ref 0.0–0.1)
Basophils Relative: 1 %
Eosinophils Absolute: 0 10*3/uL (ref 0.0–0.5)
Eosinophils Relative: 0 %
HCT: 34.1 % — ABNORMAL LOW (ref 39.0–52.0)
Hemoglobin: 11.1 g/dL — ABNORMAL LOW (ref 13.0–17.0)
Immature Granulocytes: 2 %
Lymphocytes Relative: 39 %
Lymphs Abs: 1 10*3/uL (ref 0.7–4.0)
MCH: 30.2 pg (ref 26.0–34.0)
MCHC: 32.6 g/dL (ref 30.0–36.0)
MCV: 92.7 fL (ref 80.0–100.0)
Monocytes Absolute: 0.4 10*3/uL (ref 0.1–1.0)
Monocytes Relative: 16 %
Neutro Abs: 1 10*3/uL — ABNORMAL LOW (ref 1.7–7.7)
Neutrophils Relative %: 42 %
Platelet Count: 107 10*3/uL — ABNORMAL LOW (ref 150–400)
RBC: 3.68 MIL/uL — ABNORMAL LOW (ref 4.22–5.81)
RDW: 19 % — ABNORMAL HIGH (ref 11.5–15.5)
WBC Count: 2.5 10*3/uL — ABNORMAL LOW (ref 4.0–10.5)
nRBC: 0 % (ref 0.0–0.2)

## 2021-08-07 LAB — COMPREHENSIVE METABOLIC PANEL
ALT: 9 U/L (ref 0–44)
AST: 10 U/L — ABNORMAL LOW (ref 15–41)
Albumin: 3.8 g/dL (ref 3.5–5.0)
Alkaline Phosphatase: 73 U/L (ref 38–126)
Anion gap: 8 (ref 5–15)
BUN: 15 mg/dL (ref 8–23)
CO2: 28 mmol/L (ref 22–32)
Calcium: 8.6 mg/dL — ABNORMAL LOW (ref 8.9–10.3)
Chloride: 107 mmol/L (ref 98–111)
Creatinine, Ser: 0.97 mg/dL (ref 0.61–1.24)
GFR, Estimated: >60 mL/min (ref >60)
Glucose, Bld: 126 mg/dL — ABNORMAL HIGH (ref 70–99)
Potassium: 3.6 mmol/L (ref 3.5–5.1)
Sodium: 143 mmol/L (ref 135–145)
Total Bilirubin: 0.4 mg/dL (ref 0.3–1.2)
Total Protein: 5.4 g/dL — ABNORMAL LOW (ref 6.5–8.1)

## 2021-08-07 MED ORDER — COVID-19MRNA BIVAL VACC PFIZER 30 MCG/0.3ML IM SUSP
INTRAMUSCULAR | 0 refills | Status: DC
  Filled 2021-08-07: qty 0.3, 1d supply, fill #0

## 2021-08-07 MED ORDER — HEPARIN SOD (PORK) LOCK FLUSH 100 UNIT/ML IV SOLN
500.0000 [IU] | Freq: Once | INTRAVENOUS | Status: AC
  Administered 2021-08-07: 500 [IU] via INTRAVENOUS

## 2021-08-07 MED ORDER — SODIUM CHLORIDE 0.9% FLUSH
10.0000 mL | Freq: Once | INTRAVENOUS | Status: AC
  Administered 2021-08-07: 10 mL via INTRAVENOUS

## 2021-08-07 NOTE — Patient Instructions (Signed)
Implanted Port Home Guide An implanted port is a device that is placed under the skin. It is usually placed in the chest. The device can be used to give IV medicine, to take blood, or for dialysis. You may have an implanted port if: You need IV medicine that would be irritating to the small veins in your hands or arms. You need IV medicines, such as antibiotics, for a long period of time. You need IV nutrition for a long period of time. You need dialysis. When you have a port, your health care provider can choose to use the port instead of veins in your arms for these procedures. You may have fewer limitations when using a port than you would if you used other types of long-term IVs, and you will likely be able to return to normal activities after your incision heals. An implanted port has two main parts: Reservoir. The reservoir is the part where a needle is inserted to give medicines or draw blood. The reservoir is round. After it is placed, it appears as a small, raised area under your skin. Catheter. The catheter is a thin, flexible tube that connects the reservoir to a vein. Medicine that is inserted into the reservoir goes into the catheter and then into the vein. How is my port accessed? To access your port: A numbing cream may be placed on the skin over the port site. Your health care provider will put on a mask and sterile gloves. The skin over your port will be cleaned carefully with a germ-killing soap and allowed to dry. Your health care provider will gently pinch the port and insert a needle into it. Your health care provider will check for a blood return to make sure the port is in the vein and is not clogged. If your port needs to remain accessed to get medicine continuously (constant infusion), your health care provider will place a clear bandage (dressing) over the needle site. The dressing and needle will need to be changed every week, or as told by your health care provider. What  is flushing? Flushing helps keep the port from getting clogged. Follow instructions from your health care provider about how and when to flush the port. Ports are usually flushed with saline solution or a medicine called heparin. The need for flushing will depend on how the port is used: If the port is only used from time to time to give medicines or draw blood, the port may need to be flushed: Before and after medicines have been given. Before and after blood has been drawn. As part of routine maintenance. Flushing may be recommended every 4-6 weeks. If a constant infusion is running, the port may not need to be flushed. Throw away any syringes in a disposal container that is meant for sharp items (sharps container). You can buy a sharps container from a pharmacy, or you can make one by using an empty hard plastic bottle with a cover. How long will my port stay implanted? The port can stay in for as long as your health care provider thinks it is needed. When it is time for the port to come out, a surgery will be done to remove it. The surgery will be similar to the procedure that was done to put the port in. Follow these instructions at home:  Flush your port as told by your health care provider. If you need an infusion over several days, follow instructions from your health care provider about how   to take care of your port site. Make sure you: Wash your hands with soap and water before you change your dressing. If soap and water are not available, use alcohol-based hand sanitizer. Change your dressing as told by your health care provider. Place any used dressings or infusion bags into a plastic bag. Throw that bag in the trash. Keep the dressing that covers the needle clean and dry. Do not get it wet. Do not use scissors or sharp objects near the tube. Keep the tube clamped, unless it is being used. Check your port site every day for signs of infection. Check for: Redness, swelling, or  pain. Fluid or blood. Pus or a bad smell. Protect the skin around the port site. Avoid wearing bra straps that rub or irritate the site. Protect the skin around your port from seat belts. Place a soft pad over your chest if needed. Bathe or shower as told by your health care provider. The site may get wet as long as you are not actively receiving an infusion. Return to your normal activities as told by your health care provider. Ask your health care provider what activities are safe for you. Carry a medical alert card or wear a medical alert bracelet at all times. This will let health care providers know that you have an implanted port in case of an emergency. Get help right away if: You have redness, swelling, or pain at the port site. You have fluid or blood coming from your port site. You have pus or a bad smell coming from the port site. You have a fever. Summary Implanted ports are usually placed in the chest for long-term IV access. Follow instructions from your health care provider about flushing the port and changing bandages (dressings). Take care of the area around your port by avoiding clothing that puts pressure on the area, and by watching for signs of infection. Protect the skin around your port from seat belts. Place a soft pad over your chest if needed. Get help right away if you have a fever or you have redness, swelling, pain, drainage, or a bad smell at the port site. This information is not intended to replace advice given to you by your health care provider. Make sure you discuss any questions you have with your health care provider. Document Revised: 01/11/2021 Document Reviewed: 03/07/2020 Elsevier Patient Education  2022 [employer]  

## 2021-08-11 ENCOUNTER — Other Ambulatory Visit: Payer: Self-pay | Admitting: *Deleted

## 2021-08-11 DIAGNOSIS — Z95828 Presence of other vascular implants and grafts: Secondary | ICD-10-CM

## 2021-08-14 ENCOUNTER — Inpatient Hospital Stay: Payer: Medicare Other

## 2021-08-14 ENCOUNTER — Other Ambulatory Visit: Payer: Self-pay

## 2021-08-14 VITALS — BP 124/70 | HR 75 | Temp 97.9°F | Resp 16

## 2021-08-14 DIAGNOSIS — Z95828 Presence of other vascular implants and grafts: Secondary | ICD-10-CM

## 2021-08-14 DIAGNOSIS — Z452 Encounter for adjustment and management of vascular access device: Secondary | ICD-10-CM

## 2021-08-14 DIAGNOSIS — M5416 Radiculopathy, lumbar region: Secondary | ICD-10-CM

## 2021-08-14 DIAGNOSIS — C9200 Acute myeloblastic leukemia, not having achieved remission: Secondary | ICD-10-CM | POA: Diagnosis not present

## 2021-08-14 DIAGNOSIS — M5412 Radiculopathy, cervical region: Secondary | ICD-10-CM

## 2021-08-14 DIAGNOSIS — S2231XA Fracture of one rib, right side, initial encounter for closed fracture: Secondary | ICD-10-CM

## 2021-08-14 LAB — CMP (CANCER CENTER ONLY)
ALT: 7 U/L (ref 0–44)
AST: 9 U/L — ABNORMAL LOW (ref 15–41)
Albumin: 3.7 g/dL (ref 3.5–5.0)
Alkaline Phosphatase: 67 U/L (ref 38–126)
Anion gap: 6 (ref 5–15)
BUN: 9 mg/dL (ref 8–23)
CO2: 29 mmol/L (ref 22–32)
Calcium: 8.7 mg/dL — ABNORMAL LOW (ref 8.9–10.3)
Chloride: 107 mmol/L (ref 98–111)
Creatinine: 0.96 mg/dL (ref 0.61–1.24)
GFR, Estimated: >60 mL/min (ref >60)
Glucose, Bld: 97 mg/dL (ref 70–99)
Potassium: 3.7 mmol/L (ref 3.5–5.1)
Sodium: 142 mmol/L (ref 135–145)
Total Bilirubin: 0.6 mg/dL (ref 0.3–1.2)
Total Protein: 5.3 g/dL — ABNORMAL LOW (ref 6.5–8.1)

## 2021-08-14 LAB — CBC WITH DIFFERENTIAL (CANCER CENTER ONLY)
Abs Immature Granulocytes: 0.06 10*3/uL (ref 0.00–0.07)
Basophils Absolute: 0 10*3/uL (ref 0.0–0.1)
Basophils Relative: 1 %
Eosinophils Absolute: 0 10*3/uL (ref 0.0–0.5)
Eosinophils Relative: 0 %
HCT: 33.5 % — ABNORMAL LOW (ref 39.0–52.0)
Hemoglobin: 10.8 g/dL — ABNORMAL LOW (ref 13.0–17.0)
Immature Granulocytes: 2 %
Lymphocytes Relative: 28 %
Lymphs Abs: 1.1 10*3/uL (ref 0.7–4.0)
MCH: 30.1 pg (ref 26.0–34.0)
MCHC: 32.2 g/dL (ref 30.0–36.0)
MCV: 93.3 fL (ref 80.0–100.0)
Monocytes Absolute: 0.7 10*3/uL (ref 0.1–1.0)
Monocytes Relative: 16 %
Neutro Abs: 2.1 10*3/uL (ref 1.7–7.7)
Neutrophils Relative %: 53 %
Platelet Count: 87 10*3/uL — ABNORMAL LOW (ref 150–400)
RBC: 3.59 MIL/uL — ABNORMAL LOW (ref 4.22–5.81)
RDW: 18.6 % — ABNORMAL HIGH (ref 11.5–15.5)
WBC Count: 4 10*3/uL (ref 4.0–10.5)
nRBC: 0 % (ref 0.0–0.2)

## 2021-08-14 MED ORDER — SODIUM CHLORIDE 0.9% FLUSH
10.0000 mL | Freq: Once | INTRAVENOUS | Status: AC
  Administered 2021-08-14: 10 mL via INTRAVENOUS

## 2021-08-14 MED ORDER — HEPARIN SOD (PORK) LOCK FLUSH 100 UNIT/ML IV SOLN
500.0000 [IU] | Freq: Once | INTRAVENOUS | Status: AC
  Administered 2021-08-14: 500 [IU] via INTRAVENOUS

## 2021-08-14 MED ORDER — KETOROLAC TROMETHAMINE 15 MG/ML IJ SOLN
30.0000 mg | Freq: Once | INTRAMUSCULAR | Status: AC
  Administered 2021-08-14: 30 mg via INTRAVENOUS
  Filled 2021-08-14: qty 2

## 2021-08-14 NOTE — Patient Instructions (Signed)
Implanted Port Home Guide An implanted port is a device that is placed under the skin. It is usually placed in the chest. The device can be used to give IV medicine, to take blood, or for dialysis. You may have an implanted port if: You need IV medicine that would be irritating to the small veins in your hands or arms. You need IV medicines, such as antibiotics, for a long period of time. You need IV nutrition for a long period of time. You need dialysis. When you have a port, your health care provider can choose to use the port instead of veins in your arms for these procedures. You may have fewer limitations when using a port than you would if you used other types of long-term IVs, and you will likely be able to return to normal activities after your incision heals. An implanted port has two main parts: Reservoir. The reservoir is the part where a needle is inserted to give medicines or draw blood. The reservoir is round. After it is placed, it appears as a small, raised area under your skin. Catheter. The catheter is a thin, flexible tube that connects the reservoir to a vein. Medicine that is inserted into the reservoir goes into the catheter and then into the vein. How is my port accessed? To access your port: A numbing cream may be placed on the skin over the port site. Your health care provider will put on a mask and sterile gloves. The skin over your port will be cleaned carefully with a germ-killing soap and allowed to dry. Your health care provider will gently pinch the port and insert a needle into it. Your health care provider will check for a blood return to make sure the port is in the vein and is not clogged. If your port needs to remain accessed to get medicine continuously (constant infusion), your health care provider will place a clear bandage (dressing) over the needle site. The dressing and needle will need to be changed every week, or as told by your health care provider. What  is flushing? Flushing helps keep the port from getting clogged. Follow instructions from your health care provider about how and when to flush the port. Ports are usually flushed with saline solution or a medicine called heparin. The need for flushing will depend on how the port is used: If the port is only used from time to time to give medicines or draw blood, the port may need to be flushed: Before and after medicines have been given. Before and after blood has been drawn. As part of routine maintenance. Flushing may be recommended every 4-6 weeks. If a constant infusion is running, the port may not need to be flushed. Throw away any syringes in a disposal container that is meant for sharp items (sharps container). You can buy a sharps container from a pharmacy, or you can make one by using an empty hard plastic bottle with a cover. How long will my port stay implanted? The port can stay in for as long as your health care provider thinks it is needed. When it is time for the port to come out, a surgery will be done to remove it. The surgery will be similar to the procedure that was done to put the port in. Follow these instructions at home:  Flush your port as told by your health care provider. If you need an infusion over several days, follow instructions from your health care provider about how   to take care of your port site. Make sure you: Wash your hands with soap and water before you change your dressing. If soap and water are not available, use alcohol-based hand sanitizer. Change your dressing as told by your health care provider. Place any used dressings or infusion bags into a plastic bag. Throw that bag in the trash. Keep the dressing that covers the needle clean and dry. Do not get it wet. Do not use scissors or sharp objects near the tube. Keep the tube clamped, unless it is being used. Check your port site every day for signs of infection. Check for: Redness, swelling, or  pain. Fluid or blood. Pus or a bad smell. Protect the skin around the port site. Avoid wearing bra straps that rub or irritate the site. Protect the skin around your port from seat belts. Place a soft pad over your chest if needed. Bathe or shower as told by your health care provider. The site may get wet as long as you are not actively receiving an infusion. Return to your normal activities as told by your health care provider. Ask your health care provider what activities are safe for you. Carry a medical alert card or wear a medical alert bracelet at all times. This will let health care providers know that you have an implanted port in case of an emergency. Get help right away if: You have redness, swelling, or pain at the port site. You have fluid or blood coming from your port site. You have pus or a bad smell coming from the port site. You have a fever. Summary Implanted ports are usually placed in the chest for long-term IV access. Follow instructions from your health care provider about flushing the port and changing bandages (dressings). Take care of the area around your port by avoiding clothing that puts pressure on the area, and by watching for signs of infection. Protect the skin around your port from seat belts. Place a soft pad over your chest if needed. Get help right away if you have a fever or you have redness, swelling, pain, drainage, or a bad smell at the port site. This information is not intended to replace advice given to you by your health care provider. Make sure you discuss any questions you have with your health care provider. Document Revised: 01/11/2021 Document Reviewed: 03/07/2020 Elsevier Patient Education  2022 [employer]  

## 2021-08-18 ENCOUNTER — Other Ambulatory Visit: Payer: Self-pay | Admitting: *Deleted

## 2021-08-18 DIAGNOSIS — G629 Polyneuropathy, unspecified: Secondary | ICD-10-CM

## 2021-08-18 DIAGNOSIS — Z452 Encounter for adjustment and management of vascular access device: Secondary | ICD-10-CM

## 2021-08-18 DIAGNOSIS — C9501 Acute leukemia of unspecified cell type, in remission: Secondary | ICD-10-CM

## 2021-08-18 DIAGNOSIS — S2231XA Fracture of one rib, right side, initial encounter for closed fracture: Secondary | ICD-10-CM

## 2021-08-18 DIAGNOSIS — Z95828 Presence of other vascular implants and grafts: Secondary | ICD-10-CM

## 2021-08-21 ENCOUNTER — Inpatient Hospital Stay: Payer: Medicare Other

## 2021-08-21 ENCOUNTER — Encounter: Payer: Self-pay | Admitting: Hematology & Oncology

## 2021-08-21 ENCOUNTER — Other Ambulatory Visit: Payer: Self-pay

## 2021-08-21 ENCOUNTER — Inpatient Hospital Stay (HOSPITAL_BASED_OUTPATIENT_CLINIC_OR_DEPARTMENT_OTHER): Payer: Medicare Other | Admitting: Hematology & Oncology

## 2021-08-21 VITALS — BP 122/51 | HR 75 | Temp 97.8°F | Resp 17 | Wt 216.0 lb

## 2021-08-21 DIAGNOSIS — C969 Malignant neoplasm of lymphoid, hematopoietic and related tissue, unspecified: Secondary | ICD-10-CM

## 2021-08-21 DIAGNOSIS — C9502 Acute leukemia of unspecified cell type, in relapse: Secondary | ICD-10-CM | POA: Diagnosis not present

## 2021-08-21 DIAGNOSIS — G629 Polyneuropathy, unspecified: Secondary | ICD-10-CM

## 2021-08-21 DIAGNOSIS — C9501 Acute leukemia of unspecified cell type, in remission: Secondary | ICD-10-CM

## 2021-08-21 DIAGNOSIS — C9500 Acute leukemia of unspecified cell type not having achieved remission: Secondary | ICD-10-CM

## 2021-08-21 DIAGNOSIS — Z452 Encounter for adjustment and management of vascular access device: Secondary | ICD-10-CM

## 2021-08-21 DIAGNOSIS — Z95828 Presence of other vascular implants and grafts: Secondary | ICD-10-CM

## 2021-08-21 DIAGNOSIS — G893 Neoplasm related pain (acute) (chronic): Secondary | ICD-10-CM

## 2021-08-21 DIAGNOSIS — C9200 Acute myeloblastic leukemia, not having achieved remission: Secondary | ICD-10-CM | POA: Diagnosis not present

## 2021-08-21 DIAGNOSIS — S2231XA Fracture of one rib, right side, initial encounter for closed fracture: Secondary | ICD-10-CM

## 2021-08-21 LAB — CBC WITH DIFFERENTIAL (CANCER CENTER ONLY)
Abs Immature Granulocytes: 0.04 10*3/uL (ref 0.00–0.07)
Basophils Absolute: 0 10*3/uL (ref 0.0–0.1)
Basophils Relative: 1 %
Eosinophils Absolute: 0 10*3/uL (ref 0.0–0.5)
Eosinophils Relative: 0 %
HCT: 32.2 % — ABNORMAL LOW (ref 39.0–52.0)
Hemoglobin: 10.3 g/dL — ABNORMAL LOW (ref 13.0–17.0)
Immature Granulocytes: 1 %
Lymphocytes Relative: 26 %
Lymphs Abs: 1.2 10*3/uL (ref 0.7–4.0)
MCH: 29.9 pg (ref 26.0–34.0)
MCHC: 32 g/dL (ref 30.0–36.0)
MCV: 93.6 fL (ref 80.0–100.0)
Monocytes Absolute: 0.6 10*3/uL (ref 0.1–1.0)
Monocytes Relative: 13 %
Neutro Abs: 2.8 10*3/uL (ref 1.7–7.7)
Neutrophils Relative %: 59 %
Platelet Count: 74 10*3/uL — ABNORMAL LOW (ref 150–400)
RBC: 3.44 MIL/uL — ABNORMAL LOW (ref 4.22–5.81)
RDW: 18.6 % — ABNORMAL HIGH (ref 11.5–15.5)
WBC Count: 4.6 10*3/uL (ref 4.0–10.5)
nRBC: 0 % (ref 0.0–0.2)

## 2021-08-21 LAB — CMP (CANCER CENTER ONLY)
ALT: 7 U/L (ref 0–44)
AST: 11 U/L — ABNORMAL LOW (ref 15–41)
Albumin: 3.7 g/dL (ref 3.5–5.0)
Alkaline Phosphatase: 66 U/L (ref 38–126)
Anion gap: 7 (ref 5–15)
BUN: 13 mg/dL (ref 8–23)
CO2: 28 mmol/L (ref 22–32)
Calcium: 8.8 mg/dL — ABNORMAL LOW (ref 8.9–10.3)
Chloride: 107 mmol/L (ref 98–111)
Creatinine: 1.02 mg/dL (ref 0.61–1.24)
GFR, Estimated: >60 mL/min (ref >60)
Glucose, Bld: 118 mg/dL — ABNORMAL HIGH (ref 70–99)
Potassium: 3.8 mmol/L (ref 3.5–5.1)
Sodium: 142 mmol/L (ref 135–145)
Total Bilirubin: 0.6 mg/dL (ref 0.3–1.2)
Total Protein: 5.6 g/dL — ABNORMAL LOW (ref 6.5–8.1)

## 2021-08-21 LAB — LACTATE DEHYDROGENASE: LDH: 162 U/L (ref 98–192)

## 2021-08-21 LAB — MAGNESIUM: Magnesium: 2.1 mg/dL (ref 1.7–2.4)

## 2021-08-21 LAB — SAVE SMEAR(SSMR), FOR PROVIDER SLIDE REVIEW

## 2021-08-21 MED ORDER — SODIUM CHLORIDE 0.9 % IV SOLN
10.0000 mg | Freq: Once | INTRAVENOUS | Status: AC
  Administered 2021-08-21: 10 mg via INTRAVENOUS
  Filled 2021-08-21: qty 10

## 2021-08-21 MED ORDER — SODIUM CHLORIDE 0.9 % IV SOLN: Freq: Once | INTRAVENOUS | Status: AC

## 2021-08-21 MED ORDER — HEPARIN SOD (PORK) LOCK FLUSH 100 UNIT/ML IV SOLN
500.0000 [IU] | Freq: Once | INTRAVENOUS | Status: AC | PRN
Start: 2021-08-21 — End: 2021-08-21
  Administered 2021-08-21: 500 [IU]

## 2021-08-21 MED ORDER — KETOROLAC TROMETHAMINE 15 MG/ML IJ SOLN
30.0000 mg | Freq: Once | INTRAMUSCULAR | Status: AC
  Administered 2021-08-21: 30 mg via INTRAVENOUS
  Filled 2021-08-21: qty 2

## 2021-08-21 MED ORDER — SODIUM CHLORIDE 0.9% FLUSH
10.0000 mL | INTRAVENOUS | Status: DC | PRN
  Administered 2021-08-21: 10 mL

## 2021-08-21 MED ORDER — SODIUM CHLORIDE 0.9 % IV SOLN
49.5000 mg/m2 | Freq: Once | INTRAVENOUS | Status: AC
  Administered 2021-08-21: 100 mg via INTRAVENOUS
  Filled 2021-08-21: qty 10

## 2021-08-21 MED ORDER — PALONOSETRON HCL INJECTION 0.25 MG/5ML
0.2500 mg | Freq: Once | INTRAVENOUS | Status: AC
  Administered 2021-08-21: 0.25 mg via INTRAVENOUS
  Filled 2021-08-21: qty 5

## 2021-08-21 NOTE — Patient Instructions (Signed)
Implanted Port Home Guide An implanted port is a device that is placed under the skin. It is usually placed in the chest. The device can be used to give IV medicine, to take blood, or for dialysis. You may have an implanted port if: You need IV medicine that would be irritating to the small veins in your hands or arms. You need IV medicines, such as antibiotics, for a long period of time. You need IV nutrition for a long period of time. You need dialysis. When you have a port, your health care provider can choose to use the port instead of veins in your arms for these procedures. You may have fewer limitations when using a port than you would if you used other types of long-term IVs, and you will likely be able to return to normal activities after your incision heals. An implanted port has two main parts: Reservoir. The reservoir is the part where a needle is inserted to give medicines or draw blood. The reservoir is round. After it is placed, it appears as a small, raised area under your skin. Catheter. The catheter is a thin, flexible tube that connects the reservoir to a vein. Medicine that is inserted into the reservoir goes into the catheter and then into the vein. How is my port accessed? To access your port: A numbing cream may be placed on the skin over the port site. Your health care provider will put on a mask and sterile gloves. The skin over your port will be cleaned carefully with a germ-killing soap and allowed to dry. Your health care provider will gently pinch the port and insert a needle into it. Your health care provider will check for a blood return to make sure the port is in the vein and is not clogged. If your port needs to remain accessed to get medicine continuously (constant infusion), your health care provider will place a clear bandage (dressing) over the needle site. The dressing and needle will need to be changed every week, or as told by your health care provider. What  is flushing? Flushing helps keep the port from getting clogged. Follow instructions from your health care provider about how and when to flush the port. Ports are usually flushed with saline solution or a medicine called heparin. The need for flushing will depend on how the port is used: If the port is only used from time to time to give medicines or draw blood, the port may need to be flushed: Before and after medicines have been given. Before and after blood has been drawn. As part of routine maintenance. Flushing may be recommended every 4-6 weeks. If a constant infusion is running, the port may not need to be flushed. Throw away any syringes in a disposal container that is meant for sharp items (sharps container). You can buy a sharps container from a pharmacy, or you can make one by using an empty hard plastic bottle with a cover. How long will my port stay implanted? The port can stay in for as long as your health care provider thinks it is needed. When it is time for the port to come out, a surgery will be done to remove it. The surgery will be similar to the procedure that was done to put the port in. Follow these instructions at home:  Flush your port as told by your health care provider. If you need an infusion over several days, follow instructions from your health care provider about how   to take care of your port site. Make sure you: Wash your hands with soap and water before you change your dressing. If soap and water are not available, use alcohol-based hand sanitizer. Change your dressing as told by your health care provider. Place any used dressings or infusion bags into a plastic bag. Throw that bag in the trash. Keep the dressing that covers the needle clean and dry. Do not get it wet. Do not use scissors or sharp objects near the tube. Keep the tube clamped, unless it is being used. Check your port site every day for signs of infection. Check for: Redness, swelling, or  pain. Fluid or blood. Pus or a bad smell. Protect the skin around the port site. Avoid wearing bra straps that rub or irritate the site. Protect the skin around your port from seat belts. Place a soft pad over your chest if needed. Bathe or shower as told by your health care provider. The site may get wet as long as you are not actively receiving an infusion. Return to your normal activities as told by your health care provider. Ask your health care provider what activities are safe for you. Carry a medical alert card or wear a medical alert bracelet at all times. This will let health care providers know that you have an implanted port in case of an emergency. Get help right away if: You have redness, swelling, or pain at the port site. You have fluid or blood coming from your port site. You have pus or a bad smell coming from the port site. You have a fever. Summary Implanted ports are usually placed in the chest for long-term IV access. Follow instructions from your health care provider about flushing the port and changing bandages (dressings). Take care of the area around your port by avoiding clothing that puts pressure on the area, and by watching for signs of infection. Protect the skin around your port from seat belts. Place a soft pad over your chest if needed. Get help right away if you have a fever or you have redness, swelling, pain, drainage, or a bad smell at the port site. This information is not intended to replace advice given to you by your health care provider. Make sure you discuss any questions you have with your health care provider. Document Revised: 01/11/2021 Document Reviewed: 03/07/2020 Elsevier Patient Education  2022 [employer]  

## 2021-08-21 NOTE — Patient Instructions (Signed)
Azacitidine suspension for injection (subcutaneous use) What is this medication? AZACITIDINE (ay za SITE i deen) is a chemotherapy drug. This medicine reduces the growth of cancer cells and can suppress the immune system. It is used fortreating myelodysplastic syndrome or some types of leukemia. This medicine may be used for other purposes; ask your health care provider orpharmacist if you have questions. COMMON BRAND NAME(S): Vidaza What should I tell my care team before I take this medication? They need to know if you have any of these conditions: kidney disease liver disease liver tumors an unusual or allergic reaction to azacitidine, mannitol, other medicines, foods, dyes, or preservatives pregnant or trying to get pregnant breast-feeding How should I use this medication? This medicine is for injection under the skin. It is administered in a hospitalor clinic by a specially trained health care professional. Talk to your pediatrician regarding the use of this medicine in children. Whilethis drug may be prescribed for selected conditions, precautions do apply. Overdosage: If you think you have taken too much of this medicine contact apoison control center or emergency room at once. NOTE: This medicine is only for you. Do not share this medicine with others. What if I miss a dose? It is important not to miss your dose. Call your doctor or health careprofessional if you are unable to keep an appointment. What may interact with this medication? Interactions have not been studied. Give your health care provider a list of all the medicines, herbs, non-prescription drugs, or dietary supplements you use. Also tell them if you smoke, drink alcohol, or use illegal drugs. Some items may interact with yourmedicine. This list may not describe all possible interactions. Give your health care provider a list of all the medicines, herbs, non-prescription drugs, or dietary supplements you use. Also tell  them if you smoke, drink alcohol, or use illegaldrugs. Some items may interact with your medicine. What should I watch for while using this medication? Visit your doctor for checks on your progress. This drug may make you feel generally unwell. This is not uncommon, as chemotherapy can affect healthy cells as well as cancer cells. Report any side effects. Continue your course oftreatment even though you feel ill unless your doctor tells you to stop. In some cases, you may be given additional medicines to help with side effects.Follow all directions for their use. Call your doctor or health care professional for advice if you get a fever, chills or sore throat, or other symptoms of a cold or flu. Do not treat yourself. This drug decreases your body's ability to fight infections. Try toavoid being around people who are sick. This medicine may increase your risk to bruise or bleed. Call your doctor orhealth care professional if you notice any unusual bleeding. You may need blood work done while you are taking this medicine. Do not become pregnant while taking this medicine and for 6 months after the last dose. Women should inform their doctor if they wish to become pregnant or think they might be pregnant. Men should not father a child while taking this medicine and for 3 months after the last dose. There is a potential for serious side effects to an unborn child. Talk to your health care professional or pharmacist for more information. Do not breast-feed an infant while taking thismedicine and for 1 week after the last dose. This medicine may interfere with the ability to have a child. Talk with yourdoctor or health care professional if you are concerned about your fertility.   What side effects may I notice from receiving this medication? Side effects that you should report to your doctor or health care professionalas soon as possible: allergic reactions like skin rash, itching or hives, swelling of the  face, lips, or tongue low blood counts - this medicine may decrease the number of white blood cells, red blood cells and platelets. You may be at increased risk for infections and bleeding. signs of infection - fever or chills, cough, sore throat, pain passing urine signs of decreased platelets or bleeding - bruising, pinpoint red spots on the skin, black, tarry stools, blood in the urine signs of decreased red blood cells - unusually weak or tired, fainting spells, lightheadedness signs and symptoms of kidney injury like trouble passing urine or change in the amount of urine signs and symptoms of liver injury like dark yellow or brown urine; general ill feeling or flu-like symptoms; light-colored stools; loss of appetite; nausea; right upper belly pain; unusually weak or tired; yellowing of the eyes or skin Side effects that usually do not require medical attention (report to yourdoctor or health care professional if they continue or are bothersome): constipation diarrhea nausea, vomiting pain or redness at the injection site unusually weak or tired This list may not describe all possible side effects. Call your doctor for medical advice about side effects. You may report side effects to FDA at1-800-FDA-1088. Where should I keep my medication? This drug is given in a hospital or clinic and will not be stored at home. NOTE: This sheet is a summary. It may not cover all possible information. If you have questions about this medicine, talk to your doctor, pharmacist, orhealth care provider.  2022 Elsevier/Gold Standard (2016-11-20 14:37:51)  

## 2021-08-21 NOTE — Progress Notes (Addendum)
Hematology and Oncology Follow Up Visit  Victor [name] [id number] [birth year] 84 y.o. 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 twice daily --started on 03/16/2021     Interim History:  Victor [name] is back for follow-up.  He is doing quite nicely.  He really has had a very nice response to the Vidaza/venetoclax combination.  He is followed by Victor [name] at a [facility].  He saw Victor [name] last week.  No changes were made with his protocol.  He is on Eliquis and a maintenance dose right now.  I think we need to keep him on a low-dose of Eliquis as he does have the DVT.  He did have a Doppler done on 07/17/2021.  Thankfully, this did not show any obvious evidence of thrombus in the right leg.  I think he does have a IVC filter in however.  He does have the back issues.  He has had chronic back pain.  If he needs to have injections into the back, I do not see a problem with this from my point of view.  I think his platelet count should be adequate enough.  He does need to stop the Eliquis a couple days before any injections.  He has had no problems with fever.  His appetite is good.  He has had no change in bowel or bladder habits.  There is been no rashes.  He has had no cough.  Again he had that pneumonia in the hospital but he seems of gotten over this very nicely.  Overall, I would have to say that his performance status for now is ECOG 1.    Medications:  Current Outpatient Medications:    acetaminophen (TYLENOL) 500 MG tablet, Take 1,000 mg by mouth every 6 (six) hours as needed for moderate pain., Disp: , Rfl:    apixaban (ELIQUIS) 2.5 MG TABS tablet, Take 1 tablet (2.5 mg total) by mouth 2 (two) times daily., Disp: 60 tablet, Rfl: 6   doxazosin (CARDURA) 2 MG tablet, Take 1 tablet (2 mg total) by mouth at bedtime.,  Disp: 90 tablet, Rfl: 3   gabapentin (NEURONTIN) 300 MG capsule, Take 1 capsule (300 mg total) by mouth 3 (three) times daily., Disp: 120 capsule, Rfl: 1   HYDROcodone-acetaminophen (NORCO/VICODIN) 5-325 MG tablet, Take 1 tablet by mouth every 6 (six) hours as needed for moderate pain., Disp: , Rfl:    ipratropium (ATROVENT) 0.06 % nasal spray, Place into both nostrils 2 (two) times daily as needed., Disp: , Rfl:    ketoconazole (NIZORAL) 2 % shampoo, Apply 1 application topically 2 (two) times a week., Disp: , Rfl:    levothyroxine (SYNTHROID) 150 MCG tablet, Take 1 tablet (150 mcg total) by mouth daily., Disp: 90 tablet, Rfl: 3   Lidocaine 4 % PTCH, Apply 0.5 patches topically daily as needed (pain). Uses 1/2 patch daily., Disp: , Rfl:    lidocaine-prilocaine (EMLA) cream, Apply 1 application topically as needed for up to 30 doses. (Patient taking differently: Apply 1 application topically daily as needed (port access).), Disp: 30 g, Rfl: 0   Loratadine (CLARITIN PO), Take 10 mg by mouth at bedtime. Allertec, Disp: , Rfl:    Melatonin 5 MG CHEW, Chew 5 mg by mouth daily., Disp: , Rfl:    potassium chloride (MICRO-K) 10 MEQ CR capsule, Take 10 mEq by mouth  in the morning and at bedtime., Disp: , Rfl:    propranolol ER (INDERAL LA) 80 MG 24 hr capsule, Take 1 capsule (80 mg total) by mouth daily., Disp: 90 capsule, Rfl: 0   RESTASIS 0.05 % ophthalmic emulsion, Place 1 drop into both eyes 2 (two) times daily., Disp: , Rfl:    simvastatin (ZOCOR) 40 MG tablet, TAKE ONE TABLET BY MOUTH DAILY AT BEDTIME (Patient taking differently: Take 40 mg by mouth daily at 6 PM.), Disp: 90 tablet, Rfl: 3   venetoclax 100 MG TABS, Take 400 mg by mouth daily. Takes 400 mg daily for 2 weeks and stops for 4 weeks., Disp: , Rfl:   Allergies:  Allergies  Allergen Reactions   Augmentin [Amoxicillin-Pot Clavulanate] Diarrhea    SOB    Past Medical History, Surgical history, Social history, and Family History were  reviewed and updated.  Review of Systems: Review of Systems  Constitutional: Negative.   HENT:  Negative.    Eyes: Negative.   Respiratory: Negative.    Cardiovascular: Negative.   Gastrointestinal: Negative.   Endocrine: Negative.   Genitourinary: Negative.    Musculoskeletal: Negative.   Skin: Negative.   Neurological: Negative.   Hematological: Negative.   Psychiatric/Behavioral: Negative.     Physical Exam:  weight is 216 lb (98 kg). His oral temperature is 97.8 F (36.6 C). His blood pressure is 122/51 (abnormal) and his pulse is 75. His respiration is 17 and oxygen saturation is 97%.   Wt Readings from Last 3 Encounters:  08/21/21 216 lb (98 kg)  07/11/21 205 lb (93 kg)  06/13/21 203 lb 14.8 oz (92.5 kg)    Physical Exam Vitals reviewed.  HENT:     Head: Normocephalic and atraumatic.  Eyes:     Pupils: Pupils are equal, round, and reactive to light.  Cardiovascular:     Rate and Rhythm: Normal rate and regular rhythm.     Heart sounds: Normal heart sounds.  Pulmonary:     Effort: Pulmonary effort is normal.     Breath sounds: Normal breath sounds.  Abdominal:     General: Bowel sounds are normal.     Palpations: Abdomen is soft.  Musculoskeletal:        General: No tenderness or deformity. Normal range of motion.     Cervical back: Normal range of motion.  Lymphadenopathy:     Cervical: No cervical adenopathy.  Skin:    General: Skin is warm and dry.     Findings: No erythema or rash.  Neurological:     Mental Status: He is alert and oriented to person, place, and time.  Psychiatric:        Behavior: Behavior normal.        Thought Content: Thought content normal.        Judgment: Judgment normal.     Lab Results  Component Value Date   WBC 4.6 08/21/2021   HGB 10.3 (L) 08/21/2021   HCT 32.2 (L) 08/21/2021   MCV 93.6 08/21/2021   PLT 74 (L) 08/21/2021     Chemistry      Component Value Date/Time   NA 142 08/21/2021 0828   NA 142 03/30/2015  1149   K 3.8 08/21/2021 0828   K 4.5 03/30/2015 1149   CL 107 08/21/2021 0828   CO2 28 08/21/2021 0828   CO2 25 03/30/2015 1149   BUN 13 08/21/2021 0828   BUN 17.1 03/30/2015 1149   CREATININE 1.02 08/21/2021 0076  CREATININE 1.2 03/30/2015 1149      Component Value Date/Time   CALCIUM 8.8 (L) 08/21/2021 0828   CALCIUM 8.7 03/30/2015 1149   ALKPHOS 66 08/21/2021 0828   AST 11 (L) 08/21/2021 0828   ALT 7 08/21/2021 0828   BILITOT 0.6 08/21/2021 0828       Impression and Plan: Mr. Spiker is a very nice 85 year old white male who has acute myeloid leukemia.  He does have a adverse prognostic feature with respect to the FLT3 mutation.  I know there are targeted therapies for patients who have leukemia with this FLT3 mutation.  Again, Victor [name] is doing a fantastic job with him.  He is responding well to the Vidaza and the venetoclax.  There is no indication that we have to put him on one of the new targeted therapy.  He will start his 9th cycle of treatment.  He only gets the venetoclax only 7 days.  He gets a Vidaza for 5 days.  It will work out well as next cycle will start after Thanksgiving.  He really should do well over the Thanksgiving holiday.  Again, we will watch his blood counts weekly.  He likes to have this done weekly.   Volanda Napoleon, MD 10/17/20229:15 AM

## 2021-08-21 NOTE — Progress Notes (Signed)
CBC and CMET reviewed by MD, ok to treat despite counts. VO for toradol 30mg  for pain x 1. Now.

## 2021-08-22 ENCOUNTER — Other Ambulatory Visit: Payer: Medicare Other

## 2021-08-22 ENCOUNTER — Ambulatory Visit: Payer: Medicare Other

## 2021-08-22 ENCOUNTER — Ambulatory Visit: Payer: Medicare Other | Admitting: Hematology & Oncology

## 2021-08-22 ENCOUNTER — Inpatient Hospital Stay: Payer: Medicare Other

## 2021-08-22 VITALS — BP 139/50 | HR 76 | Temp 97.9°F | Resp 20

## 2021-08-22 DIAGNOSIS — C9200 Acute myeloblastic leukemia, not having achieved remission: Secondary | ICD-10-CM | POA: Diagnosis not present

## 2021-08-22 DIAGNOSIS — C9500 Acute leukemia of unspecified cell type not having achieved remission: Secondary | ICD-10-CM

## 2021-08-22 MED ORDER — SODIUM CHLORIDE 0.9% FLUSH
10.0000 mL | INTRAVENOUS | Status: DC | PRN
  Administered 2021-08-22: 10 mL

## 2021-08-22 MED ORDER — HEPARIN SOD (PORK) LOCK FLUSH 100 UNIT/ML IV SOLN
500.0000 [IU] | Freq: Once | INTRAVENOUS | Status: AC | PRN
  Administered 2021-08-22: 500 [IU]

## 2021-08-22 MED ORDER — SODIUM CHLORIDE 0.9 % IV SOLN: Freq: Once | INTRAVENOUS | Status: AC

## 2021-08-22 MED ORDER — SODIUM CHLORIDE 0.9 % IV SOLN
10.0000 mg | Freq: Once | INTRAVENOUS | Status: AC
  Administered 2021-08-22: 10 mg via INTRAVENOUS
  Filled 2021-08-22: qty 10

## 2021-08-22 MED ORDER — SODIUM CHLORIDE 0.9 % IV SOLN
49.5000 mg/m2 | Freq: Once | INTRAVENOUS | Status: AC
  Administered 2021-08-22: 100 mg via INTRAVENOUS
  Filled 2021-08-22: qty 10

## 2021-08-22 NOTE — Patient Instructions (Signed)
[facility]  Discharge Instructions: °Thank you for choosing [facility] to provide your oncology and hematology care.  ° °If you have a lab appointment with the Cancer Center, please go directly to the Cancer Center and check in at the registration area. ° °Wear comfortable clothing and clothing appropriate for easy access to any Portacath or PICC line.  ° °We strive to give you quality time with your provider. You may need to reschedule your appointment if you arrive late (15 or more minutes).  Arriving late affects you and other patients whose appointments are after yours.  Also, if you miss three or more appointments without notifying the office, you may be dismissed from the clinic at the provider’s discretion.    °  °For prescription refill requests, have your pharmacy contact our office and allow 72 hours for refills to be completed.   ° °Today you received the following chemotherapy and/or immunotherapy agents Vidaza °  °To help prevent nausea and vomiting after your treatment, we encourage you to take your nausea medication as directed. ° °BELOW ARE SYMPTOMS THAT SHOULD BE REPORTED IMMEDIATELY: °*FEVER GREATER THAN 100.4 F (38 °C) OR HIGHER °*CHILLS OR SWEATING °*NAUSEA AND VOMITING THAT IS NOT CONTROLLED WITH YOUR NAUSEA MEDICATION °*UNUSUAL SHORTNESS OF BREATH °*UNUSUAL BRUISING OR BLEEDING °*URINARY PROBLEMS (pain or burning when urinating, or frequent urination) °*BOWEL PROBLEMS (unusual diarrhea, constipation, pain near the anus) °TENDERNESS IN MOUTH AND THROAT WITH OR WITHOUT PRESENCE OF ULCERS (sore throat, sores in mouth, or a toothache) °UNUSUAL RASH, SWELLING OR PAIN  °UNUSUAL VAGINAL DISCHARGE OR ITCHING  ° °Items with * indicate a potential emergency and should be followed up as soon as possible or go to the Emergency Department if any problems should occur. ° °Please show the CHEMOTHERAPY ALERT CARD or IMMUNOTHERAPY ALERT CARD at check-in to the  Emergency Department and triage nurse. °Should you have questions after your visit or need to cancel or reschedule your appointment, please contact [facility]  [phone] and follow the prompts.  Office hours are 8:00 a.m. to 4:30 p.m. Monday - Friday. Please note that voicemails left after 4:00 p.m. may not be returned until the following business day.  We are closed weekends and major holidays. You have access to a nurse at all times for urgent questions. Please call the main number to the clinic [phone] and follow the prompts. ° °For any non-urgent questions, you may also contact your provider using MyChart. We now offer e-Visits for anyone 18 and older to request care online for non-urgent symptoms. For details visit [email or web address]. °  °Also download the MyChart app! Go to the app store, search "MyChart", open the app, select , and log in with your MyChart username and password. ° °Due to Covid, a mask is required upon entering the hospital/clinic. If you do not have a mask, one will be given to you upon arrival. For doctor visits, patients may have 1 support person aged 18 or older with them. For treatment visits, patients cannot have anyone with them due to current Covid guidelines and our immunocompromised population.  °

## 2021-08-23 ENCOUNTER — Inpatient Hospital Stay: Payer: Medicare Other

## 2021-08-23 ENCOUNTER — Other Ambulatory Visit: Payer: Self-pay | Admitting: *Deleted

## 2021-08-23 ENCOUNTER — Other Ambulatory Visit: Payer: Self-pay

## 2021-08-23 VITALS — BP 130/53 | HR 67 | Temp 98.0°F | Resp 17

## 2021-08-23 DIAGNOSIS — C9500 Acute leukemia of unspecified cell type not having achieved remission: Secondary | ICD-10-CM

## 2021-08-23 DIAGNOSIS — C9200 Acute myeloblastic leukemia, not having achieved remission: Secondary | ICD-10-CM | POA: Diagnosis not present

## 2021-08-23 MED ORDER — SODIUM CHLORIDE 0.9 % IV SOLN: Freq: Once | INTRAVENOUS | Status: AC

## 2021-08-23 MED ORDER — SODIUM CHLORIDE 0.9 % IV SOLN
49.0000 mg/m2 | Freq: Once | INTRAVENOUS | Status: AC
  Administered 2021-08-23: 100 mg via INTRAVENOUS
  Filled 2021-08-23: qty 10

## 2021-08-23 MED ORDER — SODIUM CHLORIDE 0.9 % IV SOLN
10.0000 mg | Freq: Once | INTRAVENOUS | Status: AC
  Administered 2021-08-23: 10 mg via INTRAVENOUS
  Filled 2021-08-23: qty 10

## 2021-08-23 MED ORDER — SODIUM CHLORIDE 0.9% FLUSH
10.0000 mL | INTRAVENOUS | Status: DC | PRN
  Administered 2021-08-23: 10 mL

## 2021-08-23 MED ORDER — PALONOSETRON HCL INJECTION 0.25 MG/5ML
0.2500 mg | Freq: Once | INTRAVENOUS | Status: AC
  Administered 2021-08-23: 0.25 mg via INTRAVENOUS
  Filled 2021-08-23: qty 5

## 2021-08-23 MED ORDER — HEPARIN SOD (PORK) LOCK FLUSH 100 UNIT/ML IV SOLN
500.0000 [IU] | Freq: Once | INTRAVENOUS | Status: AC | PRN
  Administered 2021-08-23: 500 [IU]

## 2021-08-23 NOTE — Patient Instructions (Signed)
[facility]  Discharge Instructions: Thank you for choosing [facility] to provide your oncology and hematology care.   If you have a lab appointment with the [facility], please go directly to the [facility] and check in at the registration area.  Wear comfortable clothing and clothing appropriate for easy access to any Portacath or PICC line.   We strive to give you quality time with your provider. You may need to reschedule your appointment if you arrive late (15 or more minutes).  Arriving late affects you and other patients whose appointments are after yours.  Also, if you miss three or more appointments without notifying the office, you may be dismissed from the clinic at the provider's discretion.      For prescription refill requests, have your pharmacy contact our office and allow 72 hours for refills to be completed.    Today you received the following chemotherapy and/or immunotherapy agents Vidaza      To help prevent nausea and vomiting after your treatment, we encourage you to take your nausea medication as directed.  BELOW ARE SYMPTOMS THAT SHOULD BE REPORTED IMMEDIATELY: *FEVER GREATER THAN 100.4 F (38 C) OR HIGHER *CHILLS OR SWEATING *NAUSEA AND VOMITING THAT IS NOT CONTROLLED WITH YOUR NAUSEA MEDICATION *UNUSUAL SHORTNESS OF BREATH *UNUSUAL BRUISING OR BLEEDING *URINARY PROBLEMS (pain or burning when urinating, or frequent urination) *BOWEL PROBLEMS (unusual diarrhea, constipation, pain near the anus) TENDERNESS IN MOUTH AND THROAT WITH OR WITHOUT PRESENCE OF ULCERS (sore throat, sores in mouth, or a toothache) UNUSUAL RASH, SWELLING OR PAIN  UNUSUAL VAGINAL DISCHARGE OR ITCHING   Items with * indicate a potential emergency and should be followed up as soon as possible or go to the Emergency Department if any problems should occur.  Please show the CHEMOTHERAPY ALERT CARD or IMMUNOTHERAPY ALERT CARD at check-in to the  Emergency Department and triage nurse. Should you have questions after your visit or need to cancel or reschedule your appointment, please contact [facility]  [phone] and follow the prompts.  Office hours are 8:00 a.m. to 4:30 p.m. Monday - Friday. Please note that voicemails left after 4:00 p.m. may not be returned until the following business day.  We are closed weekends and major holidays. You have access to a nurse at all times for urgent questions. Please call the main number to the clinic [phone] and follow the prompts.  For any non-urgent questions, you may also contact your provider using MyChart. We now offer e-Visits for anyone 107 and older to request care online for non-urgent symptoms. For details visit [email or web address].   Also download the MyChart app! Go to the app store, search "MyChart", open the app, select [facility], and log in with your MyChart username and password.  Due to Covid, a mask is required upon entering the hospital/clinic. If you do not have a mask, one will be given to you upon arrival. For doctor visits, patients may have 1 support person aged 10 or older with them. For treatment visits, patients cannot have anyone with them due to current Covid guidelines and our immunocompromised population.

## 2021-08-24 ENCOUNTER — Inpatient Hospital Stay: Payer: Medicare Other

## 2021-08-24 VITALS — BP 124/91 | HR 66 | Temp 97.6°F | Resp 18

## 2021-08-24 DIAGNOSIS — C9500 Acute leukemia of unspecified cell type not having achieved remission: Secondary | ICD-10-CM

## 2021-08-24 DIAGNOSIS — C9200 Acute myeloblastic leukemia, not having achieved remission: Secondary | ICD-10-CM | POA: Diagnosis not present

## 2021-08-24 MED ORDER — SODIUM CHLORIDE 0.9 % IV SOLN: Freq: Once | INTRAVENOUS | Status: AC

## 2021-08-24 MED ORDER — HEPARIN SOD (PORK) LOCK FLUSH 100 UNIT/ML IV SOLN
500.0000 [IU] | Freq: Once | INTRAVENOUS | Status: AC | PRN
  Administered 2021-08-24: 500 [IU]

## 2021-08-24 MED ORDER — SODIUM CHLORIDE 0.9 % IV SOLN
49.0000 mg/m2 | Freq: Once | INTRAVENOUS | Status: AC
  Administered 2021-08-24: 100 mg via INTRAVENOUS
  Filled 2021-08-24: qty 10

## 2021-08-24 MED ORDER — SODIUM CHLORIDE 0.9% FLUSH
10.0000 mL | INTRAVENOUS | Status: DC | PRN
  Administered 2021-08-24: 10 mL

## 2021-08-24 MED ORDER — SODIUM CHLORIDE 0.9 % IV SOLN
10.0000 mg | Freq: Once | INTRAVENOUS | Status: AC
  Administered 2021-08-24: 10 mg via INTRAVENOUS
  Filled 2021-08-24: qty 10

## 2021-08-24 NOTE — Patient Instructions (Signed)
[facility]  Discharge Instructions: Thank you for choosing [facility] to provide your oncology and hematology care.   If you have a lab appointment with the [facility], please go directly to the [facility] and check in at the registration area.  Wear comfortable clothing and clothing appropriate for easy access to any Portacath or PICC line.   We strive to give you quality time with your provider. You may need to reschedule your appointment if you arrive late (15 or more minutes).  Arriving late affects you and other patients whose appointments are after yours.  Also, if you miss three or more appointments without notifying the office, you may be dismissed from the clinic at the provider's discretion.      For prescription refill requests, have your pharmacy contact our office and allow 72 hours for refills to be completed.    Today you received the following chemotherapy and/or immunotherapy agents Azacytidine      To help prevent nausea and vomiting after your treatment, we encourage you to take your nausea medication as directed.  BELOW ARE SYMPTOMS THAT SHOULD BE REPORTED IMMEDIATELY: *FEVER GREATER THAN 100.4 F (38 C) OR HIGHER *CHILLS OR SWEATING *NAUSEA AND VOMITING THAT IS NOT CONTROLLED WITH YOUR NAUSEA MEDICATION *UNUSUAL SHORTNESS OF BREATH *UNUSUAL BRUISING OR BLEEDING *URINARY PROBLEMS (pain or burning when urinating, or frequent urination) *BOWEL PROBLEMS (unusual diarrhea, constipation, pain near the anus) TENDERNESS IN MOUTH AND THROAT WITH OR WITHOUT PRESENCE OF ULCERS (sore throat, sores in mouth, or a toothache) UNUSUAL RASH, SWELLING OR PAIN  UNUSUAL VAGINAL DISCHARGE OR ITCHING   Items with * indicate a potential emergency and should be followed up as soon as possible or go to the Emergency Department if any problems should occur.  Please show the CHEMOTHERAPY ALERT CARD or IMMUNOTHERAPY ALERT CARD at check-in to the  Emergency Department and triage nurse. Should you have questions after your visit or need to cancel or reschedule your appointment, please contact [facility]  [phone] and follow the prompts.  Office hours are 8:00 a.m. to 4:30 p.m. Monday - Friday. Please note that voicemails left after 4:00 p.m. may not be returned until the following business day.  We are closed weekends and major holidays. You have access to a nurse at all times for urgent questions. Please call the main number to the clinic [phone] and follow the prompts.  For any non-urgent questions, you may also contact your provider using MyChart. We now offer e-Visits for anyone 83 and older to request care online for non-urgent symptoms. For details visit [email or web address].   Also download the MyChart app! Go to the app store, search "MyChart", open the app, select [facility], and log in with your MyChart username and password.  Due to Covid, a mask is required upon entering the hospital/clinic. If you do not have a mask, one will be given to you upon arrival. For doctor visits, patients may have 1 support person aged 2 or older with them. For treatment visits, patients cannot have anyone with them due to current Covid guidelines and our immunocompromised population.

## 2021-08-25 ENCOUNTER — Other Ambulatory Visit: Payer: Self-pay | Admitting: *Deleted

## 2021-08-25 ENCOUNTER — Other Ambulatory Visit: Payer: Self-pay

## 2021-08-25 ENCOUNTER — Inpatient Hospital Stay: Payer: Medicare Other

## 2021-08-25 VITALS — BP 127/67 | HR 68 | Temp 96.0°F

## 2021-08-25 DIAGNOSIS — C9502 Acute leukemia of unspecified cell type, in relapse: Secondary | ICD-10-CM

## 2021-08-25 DIAGNOSIS — G893 Neoplasm related pain (acute) (chronic): Secondary | ICD-10-CM

## 2021-08-25 DIAGNOSIS — C9500 Acute leukemia of unspecified cell type not having achieved remission: Secondary | ICD-10-CM

## 2021-08-25 DIAGNOSIS — C969 Malignant neoplasm of lymphoid, hematopoietic and related tissue, unspecified: Secondary | ICD-10-CM

## 2021-08-25 DIAGNOSIS — C9200 Acute myeloblastic leukemia, not having achieved remission: Secondary | ICD-10-CM | POA: Diagnosis not present

## 2021-08-25 MED ORDER — PALONOSETRON HCL INJECTION 0.25 MG/5ML
0.2500 mg | Freq: Once | INTRAVENOUS | Status: AC
  Administered 2021-08-25: 0.25 mg via INTRAVENOUS
  Filled 2021-08-25: qty 5

## 2021-08-25 MED ORDER — HEPARIN SOD (PORK) LOCK FLUSH 100 UNIT/ML IV SOLN
500.0000 [IU] | Freq: Once | INTRAVENOUS | Status: AC | PRN
  Administered 2021-08-25: 500 [IU]

## 2021-08-25 MED ORDER — SODIUM CHLORIDE 0.9 % IV SOLN
49.0000 mg/m2 | Freq: Once | INTRAVENOUS | Status: AC
  Administered 2021-08-25: 100 mg via INTRAVENOUS
  Filled 2021-08-25: qty 10

## 2021-08-25 MED ORDER — SODIUM CHLORIDE 0.9% FLUSH
10.0000 mL | INTRAVENOUS | Status: DC | PRN
  Administered 2021-08-25: 10 mL

## 2021-08-25 MED ORDER — SODIUM CHLORIDE 0.9 % IV SOLN: Freq: Once | INTRAVENOUS | Status: AC

## 2021-08-25 MED ORDER — SODIUM CHLORIDE 0.9 % IV SOLN
10.0000 mg | Freq: Once | INTRAVENOUS | Status: AC
  Administered 2021-08-25: 10 mg via INTRAVENOUS
  Filled 2021-08-25: qty 10

## 2021-08-25 NOTE — Patient Instructions (Signed)
[facility]  Discharge Instructions: Thank you for choosing [facility] to provide your oncology and hematology care.   If you have a lab appointment with the [facility], please go directly to the [facility] and check in at the registration area.  Wear comfortable clothing and clothing appropriate for easy access to any Portacath or PICC line.   We strive to give you quality time with your provider. You may need to reschedule your appointment if you arrive late (15 or more minutes).  Arriving late affects you and other patients whose appointments are after yours.  Also, if you miss three or more appointments without notifying the office, you may be dismissed from the clinic at the provider's discretion.      For prescription refill requests, have your pharmacy contact our office and allow 72 hours for refills to be completed.    Today you received the following chemotherapy and/or immunotherapy agents Vidaza      To help prevent nausea and vomiting after your treatment, we encourage you to take your nausea medication as directed.  BELOW ARE SYMPTOMS THAT SHOULD BE REPORTED IMMEDIATELY: *FEVER GREATER THAN 100.4 F (38 C) OR HIGHER *CHILLS OR SWEATING *NAUSEA AND VOMITING THAT IS NOT CONTROLLED WITH YOUR NAUSEA MEDICATION *UNUSUAL SHORTNESS OF BREATH *UNUSUAL BRUISING OR BLEEDING *URINARY PROBLEMS (pain or burning when urinating, or frequent urination) *BOWEL PROBLEMS (unusual diarrhea, constipation, pain near the anus) TENDERNESS IN MOUTH AND THROAT WITH OR WITHOUT PRESENCE OF ULCERS (sore throat, sores in mouth, or a toothache) UNUSUAL RASH, SWELLING OR PAIN  UNUSUAL VAGINAL DISCHARGE OR ITCHING   Items with * indicate a potential emergency and should be followed up as soon as possible or go to the Emergency Department if any problems should occur.  Please show the CHEMOTHERAPY ALERT CARD or IMMUNOTHERAPY ALERT CARD at check-in to the  Emergency Department and triage nurse. Should you have questions after your visit or need to cancel or reschedule your appointment, please contact [facility]  [phone] and follow the prompts.  Office hours are 8:00 a.m. to 4:30 p.m. Monday - Friday. Please note that voicemails left after 4:00 p.m. may not be returned until the following business day.  We are closed weekends and major holidays. You have access to a nurse at all times for urgent questions. Please call the main number to the clinic [phone] and follow the prompts.  For any non-urgent questions, you may also contact your provider using MyChart. We now offer e-Visits for anyone 83 and older to request care online for non-urgent symptoms. For details visit [email or web address].   Also download the MyChart app! Go to the app store, search "MyChart", open the app, select [facility], and log in with your MyChart username and password.  Due to Covid, a mask is required upon entering the hospital/clinic. If you do not have a mask, one will be given to you upon arrival. For doctor visits, patients may have 1 support person aged 10 or older with them. For treatment visits, patients cannot have anyone with them due to current Covid guidelines and our immunocompromised population.

## 2021-08-28 ENCOUNTER — Ambulatory Visit: Payer: Medicare Other

## 2021-08-28 ENCOUNTER — Other Ambulatory Visit: Payer: Self-pay

## 2021-08-28 ENCOUNTER — Inpatient Hospital Stay: Payer: Medicare Other

## 2021-08-28 VITALS — BP 111/44 | HR 70 | Temp 97.8°F | Resp 17

## 2021-08-28 DIAGNOSIS — G893 Neoplasm related pain (acute) (chronic): Secondary | ICD-10-CM

## 2021-08-28 DIAGNOSIS — C969 Malignant neoplasm of lymphoid, hematopoietic and related tissue, unspecified: Secondary | ICD-10-CM

## 2021-08-28 DIAGNOSIS — C9502 Acute leukemia of unspecified cell type, in relapse: Secondary | ICD-10-CM

## 2021-08-28 DIAGNOSIS — Z452 Encounter for adjustment and management of vascular access device: Secondary | ICD-10-CM

## 2021-08-28 DIAGNOSIS — C9200 Acute myeloblastic leukemia, not having achieved remission: Secondary | ICD-10-CM | POA: Diagnosis not present

## 2021-08-28 DIAGNOSIS — C9500 Acute leukemia of unspecified cell type not having achieved remission: Secondary | ICD-10-CM

## 2021-08-28 LAB — CBC WITH DIFFERENTIAL (CANCER CENTER ONLY)
Abs Immature Granulocytes: 0.09 10*3/uL — ABNORMAL HIGH (ref 0.00–0.07)
Basophils Absolute: 0 10*3/uL (ref 0.0–0.1)
Basophils Relative: 0 %
Eosinophils Absolute: 0 10*3/uL (ref 0.0–0.5)
Eosinophils Relative: 0 %
HCT: 33.5 % — ABNORMAL LOW (ref 39.0–52.0)
Hemoglobin: 10.9 g/dL — ABNORMAL LOW (ref 13.0–17.0)
Immature Granulocytes: 2 %
Lymphocytes Relative: 22 %
Lymphs Abs: 1.2 10*3/uL (ref 0.7–4.0)
MCH: 30.4 pg (ref 26.0–34.0)
MCHC: 32.5 g/dL (ref 30.0–36.0)
MCV: 93.6 fL (ref 80.0–100.0)
Monocytes Absolute: 0.3 10*3/uL (ref 0.1–1.0)
Monocytes Relative: 6 %
Neutro Abs: 3.7 10*3/uL (ref 1.7–7.7)
Neutrophils Relative %: 70 %
Platelet Count: 82 10*3/uL — ABNORMAL LOW (ref 150–400)
RBC: 3.58 MIL/uL — ABNORMAL LOW (ref 4.22–5.81)
RDW: 19.1 % — ABNORMAL HIGH (ref 11.5–15.5)
WBC Count: 5.3 10*3/uL (ref 4.0–10.5)
nRBC: 0.6 % — ABNORMAL HIGH (ref 0.0–0.2)

## 2021-08-28 LAB — CMP (CANCER CENTER ONLY)
ALT: 11 U/L (ref 0–44)
AST: 11 U/L — ABNORMAL LOW (ref 15–41)
Albumin: 3.4 g/dL — ABNORMAL LOW (ref 3.5–5.0)
Alkaline Phosphatase: 46 U/L (ref 38–126)
Anion gap: 8 (ref 5–15)
BUN: 31 mg/dL — ABNORMAL HIGH (ref 8–23)
CO2: 27 mmol/L (ref 22–32)
Calcium: 8.1 mg/dL — ABNORMAL LOW (ref 8.9–10.3)
Chloride: 106 mmol/L (ref 98–111)
Creatinine: 1.15 mg/dL (ref 0.61–1.24)
GFR, Estimated: >60 mL/min (ref >60)
Glucose, Bld: 105 mg/dL — ABNORMAL HIGH (ref 70–99)
Potassium: 3.7 mmol/L (ref 3.5–5.1)
Sodium: 141 mmol/L (ref 135–145)
Total Bilirubin: 0.6 mg/dL (ref 0.3–1.2)
Total Protein: 5 g/dL — ABNORMAL LOW (ref 6.5–8.1)

## 2021-08-28 LAB — MAGNESIUM: Magnesium: 2.4 mg/dL (ref 1.7–2.4)

## 2021-08-28 MED ORDER — SODIUM CHLORIDE 0.9% FLUSH
10.0000 mL | Freq: Once | INTRAVENOUS | Status: AC
  Administered 2021-08-28: 10 mL

## 2021-08-28 MED ORDER — HEPARIN SOD (PORK) LOCK FLUSH 100 UNIT/ML IV SOLN
250.0000 [IU] | Freq: Once | INTRAVENOUS | Status: AC
  Administered 2021-08-28: 500 [IU]

## 2021-08-28 NOTE — Patient Instructions (Signed)
Implanted Port Home Guide An implanted port is a device that is placed under the skin. It is usually placed in the chest. The device can be used to give IV medicine, to take blood, or for dialysis. You may have an implanted port if: You need IV medicine that would be irritating to the small veins in your hands or arms. You need IV medicines, such as antibiotics, for a long period of time. You need IV nutrition for a long period of time. You need dialysis. When you have a port, your health care provider can choose to use the port instead of veins in your arms for these procedures. You may have fewer limitations when using a port than you would if you used other types of long-term IVs, and you will likely be able to return to normal activities after your incision heals. An implanted port has two main parts: Reservoir. The reservoir is the part where a needle is inserted to give medicines or draw blood. The reservoir is round. After it is placed, it appears as a small, raised area under your skin. Catheter. The catheter is a thin, flexible tube that connects the reservoir to a vein. Medicine that is inserted into the reservoir goes into the catheter and then into the vein. How is my port accessed? To access your port: A numbing cream may be placed on the skin over the port site. Your health care provider will put on a mask and sterile gloves. The skin over your port will be cleaned carefully with a germ-killing soap and allowed to dry. Your health care provider will gently pinch the port and insert a needle into it. Your health care provider will check for a blood return to make sure the port is in the vein and is not clogged. If your port needs to remain accessed to get medicine continuously (constant infusion), your health care provider will place a clear bandage (dressing) over the needle site. The dressing and needle will need to be changed every week, or as told by your health care provider. What  is flushing? Flushing helps keep the port from getting clogged. Follow instructions from your health care provider about how and when to flush the port. Ports are usually flushed with saline solution or a medicine called heparin. The need for flushing will depend on how the port is used: If the port is only used from time to time to give medicines or draw blood, the port may need to be flushed: Before and after medicines have been given. Before and after blood has been drawn. As part of routine maintenance. Flushing may be recommended every 4-6 weeks. If a constant infusion is running, the port may not need to be flushed. Throw away any syringes in a disposal container that is meant for sharp items (sharps container). You can buy a sharps container from a pharmacy, or you can make one by using an empty hard plastic bottle with a cover. How long will my port stay implanted? The port can stay in for as long as your health care provider thinks it is needed. When it is time for the port to come out, a surgery will be done to remove it. The surgery will be similar to the procedure that was done to put the port in. Follow these instructions at home:  Flush your port as told by your health care provider. If you need an infusion over several days, follow instructions from your health care provider about how   to take care of your port site. Make sure you: Wash your hands with soap and water before you change your dressing. If soap and water are not available, use alcohol-based hand sanitizer. Change your dressing as told by your health care provider. Place any used dressings or infusion bags into a plastic bag. Throw that bag in the trash. Keep the dressing that covers the needle clean and dry. Do not get it wet. Do not use scissors or sharp objects near the tube. Keep the tube clamped, unless it is being used. Check your port site every day for signs of infection. Check for: Redness, swelling, or  pain. Fluid or blood. Pus or a bad smell. Protect the skin around the port site. Avoid wearing bra straps that rub or irritate the site. Protect the skin around your port from seat belts. Place a soft pad over your chest if needed. Bathe or shower as told by your health care provider. The site may get wet as long as you are not actively receiving an infusion. Return to your normal activities as told by your health care provider. Ask your health care provider what activities are safe for you. Carry a medical alert card or wear a medical alert bracelet at all times. This will let health care providers know that you have an implanted port in case of an emergency. Get help right away if: You have redness, swelling, or pain at the port site. You have fluid or blood coming from your port site. You have pus or a bad smell coming from the port site. You have a fever. Summary Implanted ports are usually placed in the chest for long-term IV access. Follow instructions from your health care provider about flushing the port and changing bandages (dressings). Take care of the area around your port by avoiding clothing that puts pressure on the area, and by watching for signs of infection. Protect the skin around your port from seat belts. Place a soft pad over your chest if needed. Get help right away if you have a fever or you have redness, swelling, pain, drainage, or a bad smell at the port site. This information is not intended to replace advice given to you by your health care provider. Make sure you discuss any questions you have with your health care provider. Document Revised: 01/11/2021 Document Reviewed: 03/07/2020 Elsevier Patient Education  2022 [employer]  

## 2021-09-01 ENCOUNTER — Other Ambulatory Visit: Payer: Self-pay | Admitting: *Deleted

## 2021-09-01 DIAGNOSIS — C9500 Acute leukemia of unspecified cell type not having achieved remission: Secondary | ICD-10-CM

## 2021-09-04 ENCOUNTER — Inpatient Hospital Stay: Payer: Medicare Other

## 2021-09-04 ENCOUNTER — Other Ambulatory Visit: Payer: Self-pay

## 2021-09-04 DIAGNOSIS — C9500 Acute leukemia of unspecified cell type not having achieved remission: Secondary | ICD-10-CM

## 2021-09-04 DIAGNOSIS — C9200 Acute myeloblastic leukemia, not having achieved remission: Secondary | ICD-10-CM | POA: Diagnosis not present

## 2021-09-04 DIAGNOSIS — C969 Malignant neoplasm of lymphoid, hematopoietic and related tissue, unspecified: Secondary | ICD-10-CM

## 2021-09-04 DIAGNOSIS — Z95828 Presence of other vascular implants and grafts: Secondary | ICD-10-CM

## 2021-09-04 LAB — CBC WITH DIFFERENTIAL (CANCER CENTER ONLY)
Abs Immature Granulocytes: 0.02 10*3/uL (ref 0.00–0.07)
Basophils Absolute: 0 10*3/uL (ref 0.0–0.1)
Basophils Relative: 0 %
Eosinophils Absolute: 0 10*3/uL (ref 0.0–0.5)
Eosinophils Relative: 0 %
HCT: 31.1 % — ABNORMAL LOW (ref 39.0–52.0)
Hemoglobin: 10 g/dL — ABNORMAL LOW (ref 13.0–17.0)
Immature Granulocytes: 1 %
Lymphocytes Relative: 33 %
Lymphs Abs: 0.9 10*3/uL (ref 0.7–4.0)
MCH: 30.7 pg (ref 26.0–34.0)
MCHC: 32.2 g/dL (ref 30.0–36.0)
MCV: 95.4 fL (ref 80.0–100.0)
Monocytes Absolute: 0.2 10*3/uL (ref 0.1–1.0)
Monocytes Relative: 8 %
Neutro Abs: 1.6 10*3/uL — ABNORMAL LOW (ref 1.7–7.7)
Neutrophils Relative %: 58 %
Platelet Count: 50 10*3/uL — ABNORMAL LOW (ref 150–400)
RBC: 3.26 MIL/uL — ABNORMAL LOW (ref 4.22–5.81)
RDW: 19.3 % — ABNORMAL HIGH (ref 11.5–15.5)
WBC Count: 2.7 10*3/uL — ABNORMAL LOW (ref 4.0–10.5)
nRBC: 0 % (ref 0.0–0.2)

## 2021-09-04 LAB — COMPREHENSIVE METABOLIC PANEL
ALT: 8 U/L (ref 0–44)
AST: 9 U/L — ABNORMAL LOW (ref 15–41)
Albumin: 3.7 g/dL (ref 3.5–5.0)
Alkaline Phosphatase: 51 U/L (ref 38–126)
Anion gap: 5 (ref 5–15)
BUN: 14 mg/dL (ref 8–23)
CO2: 31 mmol/L (ref 22–32)
Calcium: 8.6 mg/dL — ABNORMAL LOW (ref 8.9–10.3)
Chloride: 105 mmol/L (ref 98–111)
Creatinine, Ser: 1.01 mg/dL (ref 0.61–1.24)
GFR, Estimated: >60 mL/min (ref >60)
Glucose, Bld: 91 mg/dL (ref 70–99)
Potassium: 3.8 mmol/L (ref 3.5–5.1)
Sodium: 141 mmol/L (ref 135–145)
Total Bilirubin: 0.5 mg/dL (ref 0.3–1.2)
Total Protein: 5.4 g/dL — ABNORMAL LOW (ref 6.5–8.1)

## 2021-09-04 LAB — MAGNESIUM: Magnesium: 2.1 mg/dL (ref 1.7–2.4)

## 2021-09-04 MED ORDER — SODIUM CHLORIDE 0.9% FLUSH
10.0000 mL | Freq: Once | INTRAVENOUS | Status: AC
  Administered 2021-09-04: 10 mL via INTRAVENOUS

## 2021-09-04 MED ORDER — HEPARIN SOD (PORK) LOCK FLUSH 100 UNIT/ML IV SOLN
500.0000 [IU] | Freq: Once | INTRAVENOUS | Status: AC
  Administered 2021-09-04: 500 [IU] via INTRAVENOUS

## 2021-09-04 NOTE — Patient Instructions (Signed)
Tunneled Central Venous Catheter Flushing Guide It is important to flush your tunneled central venous catheter each time you use it, both before and after you use it. Flushing your catheter will help prevent it from clogging. What are the risks? Risks may include: Infection. Air getting into the catheter and bloodstream. Supplies needed: A clean pair of gloves. A disinfecting wipe. Use an alcohol wipe, chlorhexidine wipe, or iodine wipe as told by your health care provider. A 10 mL syringe that has been prefilled with saline solution. An empty 10 mL syringe, if a substance called heparin was injected into your catheter. How to flush your catheter When you flush your catheter, make sure you follow any specific instructions from your health care provider or the manufacturer. These are general guidelines. Flushing your catheter before use If there is heparin in your catheter: Wash your hands with soap and water. Put on gloves. Scrub the injection cap for a minimum of 15 seconds with a disinfecting wipe. Unclamp the catheter. Attach the empty syringe to the injection cap. Pull the syringe plunger back and withdraw 10 mL of blood. Place the syringe into an appropriate waste container. Scrub the injection cap for 15 seconds with a disinfecting wipe. Attach the prefilled syringe to the injection cap. Flush the catheter by pushing the plunger forward until all the liquid from the syringe is in the catheter. Remove the syringe from the injection cap. Clamp the catheter. If there is no heparin in your catheter: Wash your hands with soap and water. Put on gloves. Scrub the injection cap for 15 seconds with a disinfecting wipe. Unclamp the catheter. Attach the prefilled syringe to the injection cap. Flush the catheter by pushing the plunger forward until 5 mL of the liquid from the syringe is in the catheter. Pull back on the syringe until you see blood in the catheter. If you have been asked  to collect any blood, follow your health care provider's instructions. Otherwise, flush the catheter with the rest of the solution from the syringe. Remove the syringe from the injection cap. Clamp the catheter.  Flushing your catheter after use Wash your hands with soap and water. Put on gloves. Scrub the injection cap for 15 seconds with a disinfecting wipe. Unclamp the catheter. Attach the prefilled syringe to the injection cap. Flush the catheter by pushing the plunger forward until all of the liquid from the syringe is in the catheter. Remove the syringe from the injection cap. Clamp the catheter. Problems and solutions If blood cannot be completely cleared from the injection cap, you may need to have the injection cap replaced. If the catheter is difficult to flush, use the pulsing method. The pulsing method involves pushing only a few milliliters of solution into the catheter at a time and pausing between pushes. If you do not see blood in the catheter when you pull back on the syringe, change your body position, such as by raising your arms above your head. Take a deep breath and cough. Then, pull back on the syringe. If you still do not see blood, flush the catheter with a small amount of solution. Then, change positions again and take a breath or cough. Pull back on the syringe again. If you still do not see blood, finish flushing the catheter and contact your health care provider. Do not use your catheter until your health care provider says it is okay. General tips Have someone help you flush your catheter, if possible. Do not force fluid   through your catheter. Do not use a syringe that is larger or smaller than 10 mL. Using a smaller syringe can make the catheter burst. Do not use your catheter without flushing it first if it has heparin in it. Contact a health care provider if: You cannot see any blood in the catheter when you flush it before using it. Your catheter is difficult  to flush. Get help right away if: You cannot flush the catheter. The catheter leaks when you flush it or when there is fluid in it. There are cracks or breaks in the catheter. Summary It is important to flush your tunneled central venous catheter each time you use it, both before and after you use it. Scrub the injection cap for 15 seconds with a disinfecting wipe before and after you flush it. When you flush your catheter, make sure you follow any specific instructions from your health care provider or the manufacturer. Get help right away if you cannot flush the catheter. This information is not intended to replace advice given to you by your health care provider. Make sure you discuss any questions you have with your health care provider. Document Revised: 12/31/2019 Document Reviewed: 01/07/2019 Elsevier Patient Education  2022 [employer] Inc.  

## 2021-09-08 ENCOUNTER — Ambulatory Visit: Payer: Medicare Other | Admitting: Cardiology

## 2021-09-08 ENCOUNTER — Encounter: Payer: Self-pay | Admitting: Cardiology

## 2021-09-08 ENCOUNTER — Other Ambulatory Visit: Payer: Self-pay

## 2021-09-08 ENCOUNTER — Other Ambulatory Visit: Payer: Self-pay | Admitting: *Deleted

## 2021-09-08 VITALS — BP 114/65 | HR 79 | Temp 98.2°F | Resp 16 | Ht 70.0 in | Wt 216.0 lb

## 2021-09-08 DIAGNOSIS — D61818 Other pancytopenia: Secondary | ICD-10-CM

## 2021-09-08 DIAGNOSIS — I82411 Acute embolism and thrombosis of right femoral vein: Secondary | ICD-10-CM

## 2021-09-08 DIAGNOSIS — I951 Orthostatic hypotension: Secondary | ICD-10-CM

## 2021-09-08 DIAGNOSIS — I10 Essential (primary) hypertension: Secondary | ICD-10-CM

## 2021-09-08 DIAGNOSIS — E7800 Pure hypercholesterolemia, unspecified: Secondary | ICD-10-CM

## 2021-09-08 DIAGNOSIS — C969 Malignant neoplasm of lymphoid, hematopoietic and related tissue, unspecified: Secondary | ICD-10-CM

## 2021-09-08 NOTE — Progress Notes (Signed)
Primary Physician/Referring:  [name], MD  Patient ID: Victor [name], male    DOB: 08-27-1936, 85 y.o.   MRN: [id number]  Chief Complaint  Patient presents with   PAD   Hyperlipidemia   Follow-up    6 MONTH   Hypertension   HPI:    Victor [name]  is a 85 y.o. 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    He was admitted to the hospital with chest pain and was found to have subsegmental pulmonary embolism with right leg DVT and was started on Eliquis and underwent IVC placement on 03/17/2021 with Bard Denali filter.  He is presently tolerating anticoagulation well without bleeding diathesis.  He has mild right leg edema.  He has not had any further chest pain.  He denies any dyspnea, PND or orthopnea.   Past Medical History:  Diagnosis Date   Arthritis    Atherosclerosis    Atherosclerotic PVD with intermittent claudication (HCC)    stent left leg   BPH (benign prostatic hyperplasia)    Bulging lumbar disc    Cancer ([place]) 11/12/14   vocal cord  carcinoma in situ , radiation; thyroid cancer   Carotid bruit    COPD (chronic obstructive pulmonary disease) (HCC)    DDD (degenerative disc disease), lumbar    Dysphonia    Dysplasia of true vocal cord    GERD (gastroesophageal reflux disease)    H/O carotid atherosclerosis    b/l   Hyperlipidemia    Hypothyroidism    Occasional tremors    left hand managed with propranolol   Peripheral vascular angioplasty status with implants and grafts    Precancerous lesion 03/05/2018   premelanoma removed from back.    Radiation 03/10/15- 04/18/16   Larynx   Sleep apnea    hasn't used CPAP in years   Spondylosis of  lumbosacral region    Thyroid disease    Past Surgical History:  Procedure Laterality Date   APPENDECTOMY     COLONOSCOPY W/ POLYPECTOMY     DIODE LASER APPLICATION Left 64/33/2951   Procedure: DIODE LASER APPLICATION;  Surgeon: Hayden Pedro, MD;  Location: [place];  Service: Ophthalmology;  Laterality: Left;   IR IMAGING GUIDED PORT INSERTION  01/16/2021   IR IVC FILTER PLMT / S&I /IMG GUID/MOD SED  03/17/2021   MEMBRANE PEEL Left 08/18/2019   Procedure: MEMBRANE PEEL;  Surgeon: Hayden Pedro, MD;  Location: [place];  Service: Ophthalmology;  Laterality: Left;   MOUTH SURGERY     tooth extraction   PARS PLANA VITRECTOMY Left 08/18/2019   PARS PLANA VITRECTOMY 27 GAUGE (Left)   PARS PLANA VITRECTOMY 27 GAUGE Left 08/18/2019   Procedure: PARS PLANA VITRECTOMY 27 GAUGE;  Surgeon: Hayden Pedro, MD;  Location: [place];  Service: Ophthalmology;  Laterality: Left;   PHOTOCOAGULATION WITH LASER Left 08/18/2019   Procedure: PHOTOCOAGULATION WITH LASER;  Surgeon: Hayden Pedro, MD;  Location: [place];  Service: Ophthalmology;  Laterality: Left;   THYROID SURGERY     TONSILLECTOMY     vocal cord biopsy  11/12/14   squamous cell carcinoma in situ   Family History  Problem Relation Age of Onset   Colon cancer Father    Cancer Sister  Died of metastatic cancer   Leukemia Neg Hx     Social History   Tobacco Use   Smoking status: Former    Packs/day: 2.00    Years: 51.00    Pack years: 102.00    Types: Cigarettes    Start date: 59    Quit date: 03/05/2004    Years since quitting: 17.5   Smokeless tobacco: Never  Substance Use Topics   Alcohol use: Yes    Alcohol/week: 3.0 standard drinks    Types: 3 Glasses of wine per week    Comment: occ   Marital Status: Married  ROS  Review of Systems  Cardiovascular:  Positive for leg swelling. Negative for chest pain and dyspnea on exertion.  Musculoskeletal:  Positive for arthritis. Negative for muscle cramps.   Gastrointestinal:  Negative for melena.  Objective  Blood pressure 114/65, pulse 79, temperature 98.2 F (36.8 C), resp. rate 16, height 5\' 10"  (1.778 m), weight 216 lb (98 kg), SpO2 96 %.  Vitals with BMI 09/08/2021 08/28/2021 08/25/2021  Height 5\' 10"  - -  Weight 216 lbs - -  BMI 06.26 - -  Systolic 948 546 270  Diastolic 65 44 67  Pulse 79 70 68    Orthostatic VS for the past 72 hrs (Last 3 readings):  Patient Position BP Location Cuff Size  09/08/21 1107 Sitting Left Arm Large     Physical Exam Neck:     Vascular: No JVD.  Cardiovascular:     Rate and Rhythm: Normal rate and regular rhythm.     Pulses: Intact distal pulses.          Carotid pulses are 2+ on the right side and 2+ on the left side.      Femoral pulses are 2+ on the right side and 1+ on the left side.      Popliteal pulses are 2+ on the right side and 1+ on the left side.       Dorsalis pedis pulses are 2+ on the right side and 1+ on the left side.       Posterior tibial pulses are 1+ on the right side and 0 on the left side.     Heart sounds: Normal heart sounds. No murmur heard.   No gallop.  Pulmonary:     Effort: Pulmonary effort is normal.     Breath sounds: Normal breath sounds.  Abdominal:     General: Bowel sounds are normal.     Palpations: Abdomen is soft.  Musculoskeletal:     Right lower leg: Edema (1-2 +) present.   Laboratory examination:   Recent Labs    08/21/21 0828 08/28/21 0846 09/04/21 0854  NA 142 141 141  K 3.8 3.7 3.8  CL 107 106 105  CO2 28 27 31   GLUCOSE 118* 105* 91  BUN 13 31* 14  CREATININE 1.02 1.15 1.01  CALCIUM 8.8* 8.1* 8.6*  GFRNONAA >60 >60 >60   estimated creatinine clearance is 65.1 mL/min (by C-G formula based on SCr of 1.01 mg/dL).  CMP Latest Ref Rng & Units 09/04/2021 08/28/2021 08/21/2021  Glucose 70 - 99 mg/dL 91 105(H) 118(H)  BUN 8 - 23 mg/dL 14 31(H) 13  Creatinine 0.61 - 1.24 mg/dL 1.01 1.15 1.02  Sodium 135 - 145 mmol/L 141 141 142   Potassium 3.5 - 5.1 mmol/L 3.8 3.7 3.8  Chloride 98 - 111 mmol/L 105 106 107  CO2 22 - 32 mmol/L 31 27 28   Calcium  8.9 - 10.3 mg/dL 8.6(L) 8.1(L) 8.8(L)  Total Protein 6.5 - 8.1 g/dL 5.4(L) 5.0(L) 5.6(L)  Total Bilirubin 0.3 - 1.2 mg/dL 0.5 0.6 0.6  Alkaline Phos 38 - 126 U/L 51 46 66  AST 15 - 41 U/L 9(L) 11(L) 11(L)  ALT 0 - 44 U/L 8 11 7    CBC Latest Ref Rng & Units 09/04/2021 08/28/2021 08/21/2021  WBC 4.0 - 10.5 K/uL 2.7(L) 5.3 4.6  Hemoglobin 13.0 - 17.0 g/dL 10.0(L) 10.9(L) 10.3(L)  Hematocrit 39.0 - 52.0 % 31.1(L) 33.5(L) 32.2(L)  Platelets 150 - 400 K/uL 50(L) 82(L) 74(L)   Lipid Panel     Component Value Date/Time   CHOL 157 03/23/2021 0000   TRIG 132 03/23/2021 0000   HDL 34 (L) 03/23/2021 0000   CHOLHDL 4 11/12/2018 0847   VLDL 29.8 11/12/2018 0847   LDLCALC 99 03/23/2021 0000   LABVLDL 24 03/23/2021 0000     Lipid Panel Cholesterol, total 139.000 m 11/12/2018 HDL 37.700 mg 11/12/2018 LDL 71.000 mg 11/12/2018 Triglycerides 149.000 m 11/12/2018    HEMOGLOBIN A1C No results found for: HGBA1C, MPG TSH Recent Labs    06/06/21 1402  TSH 0.12*    External labs:   Labs 12/05/2020:  Hb 12.2/HCT 36.9, WBC 3.3, platelets 94. Microcytic indicis.  Sodium 142, potassium 4.0, BUN 25, creatinine 0.91, CMP otherwise normal. Serum glucose 105 mg.  Labs 11/09/2020:  TSH normal.  Medications and allergies   Allergies  Allergen Reactions   Augmentin [Amoxicillin-Pot Clavulanate] Diarrhea    SOB     Current Meds  Medication Sig   acetaminophen (TYLENOL) 500 MG tablet Take 1,000 mg by mouth every 6 (six) hours as needed for moderate pain.   apixaban (ELIQUIS) 2.5 MG TABS tablet Take 1 tablet (2.5 mg total) by mouth 2 (two) times daily.   doxazosin (CARDURA) 2 MG tablet Take 1 tablet (2 mg total) by mouth at bedtime.   gabapentin (NEURONTIN) 300 MG capsule Take 1 capsule (300 mg total) by mouth 3 (three) times daily.   HYDROcodone-acetaminophen (NORCO/VICODIN)  5-325 MG tablet Take 1 tablet by mouth every 6 (six) hours as needed for moderate pain.   ipratropium (ATROVENT) 0.06 % nasal spray Place into both nostrils 2 (two) times daily as needed.   ketoconazole (NIZORAL) 2 % shampoo Apply 1 application topically 2 (two) times a week.   levothyroxine (SYNTHROID) 150 MCG tablet Take 1 tablet (150 mcg total) by mouth daily.   Lidocaine 4 % PTCH Apply 0.5 patches topically daily as needed (pain). Uses 1/2 patch daily.   lidocaine-prilocaine (EMLA) cream Apply 1 application topically as needed for up to 30 doses. (Patient taking differently: Apply 1 application topically daily as needed (port access).)   Loratadine (CLARITIN PO) Take 10 mg by mouth at bedtime. Allertec   Melatonin 5 MG CHEW Chew 5 mg by mouth daily.   potassium chloride (MICRO-K) 10 MEQ CR capsule Take 10 mEq by mouth in the morning and at bedtime.   propranolol ER (INDERAL LA) 80 MG 24 hr capsule Take 1 capsule (80 mg total) by mouth daily.   RESTASIS 0.05 % ophthalmic emulsion Place 1 drop into both eyes 2 (two) times daily.   simvastatin (ZOCOR) 40 MG tablet TAKE ONE TABLET BY MOUTH DAILY AT BEDTIME (Patient taking differently: Take 40 mg by mouth daily at 6 PM.)   venetoclax 100 MG TABS Take 400 mg by mouth daily. Takes 400 mg daily for 2 weeks and stops for 4 weeks.  Radiology:   MRI of the brain 09/06/2020: Chronic microvascular ischemic disease. Remote lacunar infarct left cerebellar hemisphere.  CTA Head & Neck 09/16/2020: 1.  No evidence of acute intracranial abnormality. However, CT is relatively insensitive for the detection of acute infarct within the first 24-48 hours, and MRI may be indicated if there is high clinical suspicion.  2.  Multifocal high-grade stenosis versus occlusion of the nondominant right vertebral artery, which although likely chronic could be related to the reported complaint of dizziness. Consider neurovascular referral for further assessment.  3.   Lobulated soft tissue density lesion interposed between the right levator scapula and trapezius muscles measuring up to 2.2 cm, which is favored to represent a venous malformation with associated phlebolith and could be further evaluated with ultrasound.   Aortic arch: No significant stenosis of the origins of the major arch vessels.  Left carotid system: Atherosclerotic calcification at the common carotid artery bifurcation without evidence of significant (50% or greater) stenosis or occlusion.  Right carotid system: Atherosclerotic calcification at the common carotid artery bifurcation without evidence of significant (50% or greater) stenosis or occlusion.  Vertebral arteries: Left dominant. Multifocal high-grade stenosis versus occlusion, including areas of nonopacification of the V2, V3, and V4 segments, of the nondominant right vertebral artery. Mild to moderate atherosclerotic narrowing at the origin of the left vertebral artery without substantial narrowing distally.   Cardiac Studies:   Exercise Treadmill Stress Test 05/26/2018: Indication: CP  The patient exercised on Bruce protocol for 6:00 min. Patient achieved 7.05 METS and reached HR 107 bpm, which is 76% of maximum age-predicted HR. Stress test terminated due to Fatigue.  Exercise capacity was below average for age. HR Response to Exercise: Attenuated secondary to medication. BP Response to Exercise: Normal resting BP- exaggerated response. Peak BP 200/80 mmHg Chest Pain: none. Arrhythmias: none. ST Changes: With peak exercise there was no ST-T changes of ischemia.  Overall Impression: Inadequate subotpimal stress test Consider further cardiac evaluation including cardiac catheterization if clinically indicated.  ABD aortic duplex 10/16/2018: Mild ectasia involving mid and distal aorta. Normal iliac artery velocity. Mixed plaque noted.  LE Doppler 10/16/2018: Indication: Bilateral common iliac artery stent and left external  iliac artery stent by Dr. Denyce Robert Moderate velocity increase at the right posterior tibial artery suggestive of > 50% stenosis. Moderate velocity increase at the left distal superficial femoral and posterior tibial arteries suggestive of >50% stenosis. Normal triphasic waveforms of both ankles. Mildly decreased bilateral resting ABI at 0.93.  Carotid artery duplex 05/04/2020: Right Carotid: Velocities in the right ICA are consistent with a 1-39% stenosis. Non-hemodynamically significant plaque <50% noted in the CCA. Left Carotid: Velocities in the left ICA are consistent with a 1-39% stenosis. Non-hemodynamically significant plaque <50% noted in the CCA. Vertebrals:  Left vertebral artery demonstrates antegrade flow. Right vertebral artery demonstrates high resistant flow.  Minimum flow noted in the vertebral artery. High resistant waveform is evident suggesting  distal occlusion vs high grade stenosis. Subclavians: Right subclavian artery flow was disturbed. Normal flow hemodynamics were seen in the left subclavian artery.  Echocardiogram 07/17/2019:  Left ventricle cavity is normal in size. Mild concentric hypertrophy of the left ventricle. Normal LV systolic function with EF 57%. Normal global wall motion. Doppler evidence of grade I (impaired) diastolic dysfunction, normal LAP.  Moderate (Grade II) mitral regurgitation. Inadequate TR jet to estimate pulmonary artery systolic pressure. Normal right atrial pressure.  IVC filter placement 03/17/2021: Successful placement of Bard Denali IVC filter.  The  filter is potentially retrievable.  EKG:   EKG 09/08/2021: Normal sinus rhythm heart rate 81 bpm, left axis deviation, left anterior fascicular block.  Low-voltage complexes.  No significant change from 12/05/2020.   Assessment     ICD-10-CM   1. Primary hypertension  I10 EKG 12-Lead    2. Acute deep vein thrombosis (DVT) of femoral vein of right lower extremity (HCC)  I82.411     3.  Hypercholesteremia  E78.00     4. Orthostatic hypotension  I95.1       There are no discontinued medications.   No orders of the defined types were placed in this encounter.  Orders Placed This Encounter  Procedures   EKG 12-Lead   Recommendations:   Victor [name] is a 85 y.o. 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    He was admitted to the hospital with chest pain and was found to have subsegmental pulmonary embolism with right leg DVT and was started on Eliquis and underwent IVC placement on 03/17/2021 with Bard Denali filter.   From cardiac/vascular standpoint although he has peripheral artery disease, he has remained stable without claudication and peripheral arterial pulses are still intact.  No change in his physical exam since 6 months ago.  He does have intracerebral vascular disease and vertebral stenosis that is asymptomatic.  Blood pressures well controlled and lipids are at goal.    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  As he has tolerated Eliquis for 6 months, the event occurred exactly 6 months ago, we could potentially consider retrieving the IVC filter now and also discontinue Eliquis after the IVC is retrieved.  I will see him back in 6 months for follow-up and I will discuss with Dr. Burney Gauze, hematology if this would be appropriate action to take.   I was able to get in touch with Dr. Burney Gauze, he prefers to continue Eliquis for now, but agrees for IVC retrieval.  I will set this up.  I also discussed with the patient and his wife regarding less than 1%  risk of perforation, embolic complication, bleeding complication related to this.  I will hold Eliquis 1 day before the procedure.   [name], MD, [facility] 09/08/2021, 2:00 PM Office: [phone]  Cc: Burney Gauze, MD

## 2021-09-11 ENCOUNTER — Other Ambulatory Visit: Payer: Self-pay

## 2021-09-11 ENCOUNTER — Inpatient Hospital Stay: Payer: Medicare Other | Attending: Oncology

## 2021-09-11 ENCOUNTER — Inpatient Hospital Stay: Payer: Medicare Other

## 2021-09-11 DIAGNOSIS — I2699 Other pulmonary embolism without acute cor pulmonale: Secondary | ICD-10-CM | POA: Diagnosis not present

## 2021-09-11 DIAGNOSIS — C969 Malignant neoplasm of lymphoid, hematopoietic and related tissue, unspecified: Secondary | ICD-10-CM | POA: Insufficient documentation

## 2021-09-11 DIAGNOSIS — C9200 Acute myeloblastic leukemia, not having achieved remission: Secondary | ICD-10-CM | POA: Diagnosis not present

## 2021-09-11 DIAGNOSIS — Z5111 Encounter for antineoplastic chemotherapy: Secondary | ICD-10-CM | POA: Diagnosis present

## 2021-09-11 DIAGNOSIS — G629 Polyneuropathy, unspecified: Secondary | ICD-10-CM | POA: Diagnosis not present

## 2021-09-11 DIAGNOSIS — D61818 Other pancytopenia: Secondary | ICD-10-CM

## 2021-09-11 DIAGNOSIS — Z79899 Other long term (current) drug therapy: Secondary | ICD-10-CM | POA: Diagnosis not present

## 2021-09-11 DIAGNOSIS — Z452 Encounter for adjustment and management of vascular access device: Secondary | ICD-10-CM

## 2021-09-11 LAB — CBC WITH DIFFERENTIAL (CANCER CENTER ONLY)
Abs Immature Granulocytes: 0.02 10*3/uL (ref 0.00–0.07)
Basophils Absolute: 0 10*3/uL (ref 0.0–0.1)
Basophils Relative: 2 %
Eosinophils Absolute: 0 10*3/uL (ref 0.0–0.5)
Eosinophils Relative: 1 %
HCT: 29.5 % — ABNORMAL LOW (ref 39.0–52.0)
Hemoglobin: 9.3 g/dL — ABNORMAL LOW (ref 13.0–17.0)
Immature Granulocytes: 1 %
Lymphocytes Relative: 47 %
Lymphs Abs: 0.8 10*3/uL (ref 0.7–4.0)
MCH: 30.4 pg (ref 26.0–34.0)
MCHC: 31.5 g/dL (ref 30.0–36.0)
MCV: 96.4 fL (ref 80.0–100.0)
Monocytes Absolute: 0.2 10*3/uL (ref 0.1–1.0)
Monocytes Relative: 13 %
Neutro Abs: 0.6 10*3/uL — ABNORMAL LOW (ref 1.7–7.7)
Neutrophils Relative %: 36 %
Platelet Count: 79 10*3/uL — ABNORMAL LOW (ref 150–400)
RBC: 3.06 MIL/uL — ABNORMAL LOW (ref 4.22–5.81)
RDW: 19.2 % — ABNORMAL HIGH (ref 11.5–15.5)
WBC Count: 1.7 10*3/uL — ABNORMAL LOW (ref 4.0–10.5)
nRBC: 1.2 % — ABNORMAL HIGH (ref 0.0–0.2)

## 2021-09-11 LAB — CMP (CANCER CENTER ONLY)
ALT: 7 U/L (ref 0–44)
AST: 10 U/L — ABNORMAL LOW (ref 15–41)
Albumin: 3.7 g/dL (ref 3.5–5.0)
Alkaline Phosphatase: 54 U/L (ref 38–126)
Anion gap: 7 (ref 5–15)
BUN: 16 mg/dL (ref 8–23)
CO2: 28 mmol/L (ref 22–32)
Calcium: 8.6 mg/dL — ABNORMAL LOW (ref 8.9–10.3)
Chloride: 106 mmol/L (ref 98–111)
Creatinine: 1.03 mg/dL (ref 0.61–1.24)
GFR, Estimated: >60 mL/min (ref >60)
Glucose, Bld: 115 mg/dL — ABNORMAL HIGH (ref 70–99)
Potassium: 3.7 mmol/L (ref 3.5–5.1)
Sodium: 141 mmol/L (ref 135–145)
Total Bilirubin: 0.6 mg/dL (ref 0.3–1.2)
Total Protein: 5.3 g/dL — ABNORMAL LOW (ref 6.5–8.1)

## 2021-09-11 MED ORDER — SODIUM CHLORIDE 0.9% FLUSH
10.0000 mL | Freq: Once | INTRAVENOUS | Status: AC
  Administered 2021-09-11: 10 mL

## 2021-09-11 MED ORDER — HEPARIN SOD (PORK) LOCK FLUSH 100 UNIT/ML IV SOLN
250.0000 [IU] | Freq: Once | INTRAVENOUS | Status: AC
  Administered 2021-09-11: 500 [IU]

## 2021-09-11 NOTE — Patient Instructions (Signed)

## 2021-09-15 ENCOUNTER — Other Ambulatory Visit: Payer: Self-pay | Admitting: *Deleted

## 2021-09-15 DIAGNOSIS — C969 Malignant neoplasm of lymphoid, hematopoietic and related tissue, unspecified: Secondary | ICD-10-CM

## 2021-09-15 DIAGNOSIS — Z95828 Presence of other vascular implants and grafts: Secondary | ICD-10-CM

## 2021-09-15 DIAGNOSIS — Z452 Encounter for adjustment and management of vascular access device: Secondary | ICD-10-CM

## 2021-09-15 DIAGNOSIS — D61818 Other pancytopenia: Secondary | ICD-10-CM

## 2021-09-15 DIAGNOSIS — C9500 Acute leukemia of unspecified cell type not having achieved remission: Secondary | ICD-10-CM

## 2021-09-18 ENCOUNTER — Inpatient Hospital Stay: Payer: Medicare Other

## 2021-09-18 ENCOUNTER — Other Ambulatory Visit: Payer: Self-pay

## 2021-09-18 DIAGNOSIS — Z95828 Presence of other vascular implants and grafts: Secondary | ICD-10-CM

## 2021-09-18 DIAGNOSIS — C969 Malignant neoplasm of lymphoid, hematopoietic and related tissue, unspecified: Secondary | ICD-10-CM

## 2021-09-18 DIAGNOSIS — C9500 Acute leukemia of unspecified cell type not having achieved remission: Secondary | ICD-10-CM

## 2021-09-18 DIAGNOSIS — D61818 Other pancytopenia: Secondary | ICD-10-CM

## 2021-09-18 DIAGNOSIS — C9200 Acute myeloblastic leukemia, not having achieved remission: Secondary | ICD-10-CM | POA: Diagnosis not present

## 2021-09-18 DIAGNOSIS — Z452 Encounter for adjustment and management of vascular access device: Secondary | ICD-10-CM

## 2021-09-18 LAB — CMP (CANCER CENTER ONLY)
ALT: 7 U/L (ref 0–44)
AST: 10 U/L — ABNORMAL LOW (ref 15–41)
Albumin: 3.7 g/dL (ref 3.5–5.0)
Alkaline Phosphatase: 65 U/L (ref 38–126)
Anion gap: 7 (ref 5–15)
BUN: 13 mg/dL (ref 8–23)
CO2: 28 mmol/L (ref 22–32)
Calcium: 8.5 mg/dL — ABNORMAL LOW (ref 8.9–10.3)
Chloride: 108 mmol/L (ref 98–111)
Creatinine: 1.05 mg/dL (ref 0.61–1.24)
GFR, Estimated: >60 mL/min (ref >60)
Glucose, Bld: 126 mg/dL — ABNORMAL HIGH (ref 70–99)
Potassium: 3.8 mmol/L (ref 3.5–5.1)
Sodium: 143 mmol/L (ref 135–145)
Total Bilirubin: 0.5 mg/dL (ref 0.3–1.2)
Total Protein: 5.1 g/dL — ABNORMAL LOW (ref 6.5–8.1)

## 2021-09-18 LAB — CBC WITH DIFFERENTIAL (CANCER CENTER ONLY)
Abs Immature Granulocytes: 0.04 10*3/uL (ref 0.00–0.07)
Basophils Absolute: 0 10*3/uL (ref 0.0–0.1)
Basophils Relative: 1 %
Eosinophils Absolute: 0 10*3/uL (ref 0.0–0.5)
Eosinophils Relative: 0 %
HCT: 30.2 % — ABNORMAL LOW (ref 39.0–52.0)
Hemoglobin: 9.5 g/dL — ABNORMAL LOW (ref 13.0–17.0)
Immature Granulocytes: 2 %
Lymphocytes Relative: 39 %
Lymphs Abs: 0.9 10*3/uL (ref 0.7–4.0)
MCH: 30.6 pg (ref 26.0–34.0)
MCHC: 31.5 g/dL (ref 30.0–36.0)
MCV: 97.4 fL (ref 80.0–100.0)
Monocytes Absolute: 0.4 10*3/uL (ref 0.1–1.0)
Monocytes Relative: 18 %
Neutro Abs: 1 10*3/uL — ABNORMAL LOW (ref 1.7–7.7)
Neutrophils Relative %: 40 %
Platelet Count: 127 10*3/uL — ABNORMAL LOW (ref 150–400)
RBC: 3.1 MIL/uL — ABNORMAL LOW (ref 4.22–5.81)
RDW: 18.6 % — ABNORMAL HIGH (ref 11.5–15.5)
WBC Count: 2.4 10*3/uL — ABNORMAL LOW (ref 4.0–10.5)
nRBC: 0 % (ref 0.0–0.2)

## 2021-09-18 LAB — MAGNESIUM: Magnesium: 2.2 mg/dL (ref 1.7–2.4)

## 2021-09-18 MED ORDER — SODIUM CHLORIDE 0.9% FLUSH
10.0000 mL | Freq: Once | INTRAVENOUS | Status: AC
  Administered 2021-09-18: 10 mL via INTRAVENOUS

## 2021-09-18 MED ORDER — HEPARIN SOD (PORK) LOCK FLUSH 100 UNIT/ML IV SOLN
500.0000 [IU] | Freq: Once | INTRAVENOUS | Status: AC
  Administered 2021-09-18: 500 [IU] via INTRAVENOUS

## 2021-09-18 NOTE — Patient Instructions (Signed)

## 2021-09-22 ENCOUNTER — Other Ambulatory Visit: Payer: Self-pay | Admitting: *Deleted

## 2021-09-22 ENCOUNTER — Ambulatory Visit: Payer: Medicare Other | Admitting: Cardiology

## 2021-09-22 DIAGNOSIS — Z95828 Presence of other vascular implants and grafts: Secondary | ICD-10-CM

## 2021-09-22 DIAGNOSIS — C969 Malignant neoplasm of lymphoid, hematopoietic and related tissue, unspecified: Secondary | ICD-10-CM

## 2021-09-25 ENCOUNTER — Inpatient Hospital Stay: Payer: Medicare Other

## 2021-09-25 ENCOUNTER — Other Ambulatory Visit: Payer: Self-pay

## 2021-09-25 DIAGNOSIS — S2231XA Fracture of one rib, right side, initial encounter for closed fracture: Secondary | ICD-10-CM

## 2021-09-25 DIAGNOSIS — M5412 Radiculopathy, cervical region: Secondary | ICD-10-CM

## 2021-09-25 DIAGNOSIS — Z452 Encounter for adjustment and management of vascular access device: Secondary | ICD-10-CM

## 2021-09-25 DIAGNOSIS — M5416 Radiculopathy, lumbar region: Secondary | ICD-10-CM

## 2021-09-25 DIAGNOSIS — Z95828 Presence of other vascular implants and grafts: Secondary | ICD-10-CM

## 2021-09-25 DIAGNOSIS — C9200 Acute myeloblastic leukemia, not having achieved remission: Secondary | ICD-10-CM | POA: Diagnosis not present

## 2021-09-25 DIAGNOSIS — C969 Malignant neoplasm of lymphoid, hematopoietic and related tissue, unspecified: Secondary | ICD-10-CM

## 2021-09-25 LAB — CBC WITH DIFFERENTIAL (CANCER CENTER ONLY)
Abs Immature Granulocytes: 0.06 10*3/uL (ref 0.00–0.07)
Basophils Absolute: 0 10*3/uL (ref 0.0–0.1)
Basophils Relative: 1 %
Eosinophils Absolute: 0 10*3/uL (ref 0.0–0.5)
Eosinophils Relative: 0 %
HCT: 32.9 % — ABNORMAL LOW (ref 39.0–52.0)
Hemoglobin: 10.2 g/dL — ABNORMAL LOW (ref 13.0–17.0)
Immature Granulocytes: 1 %
Lymphocytes Relative: 23 %
Lymphs Abs: 1.2 10*3/uL (ref 0.7–4.0)
MCH: 30 pg (ref 26.0–34.0)
MCHC: 31 g/dL (ref 30.0–36.0)
MCV: 96.8 fL (ref 80.0–100.0)
Monocytes Absolute: 0.7 10*3/uL (ref 0.1–1.0)
Monocytes Relative: 13 %
Neutro Abs: 3.3 10*3/uL (ref 1.7–7.7)
Neutrophils Relative %: 62 %
Platelet Count: 72 10*3/uL — ABNORMAL LOW (ref 150–400)
RBC: 3.4 MIL/uL — ABNORMAL LOW (ref 4.22–5.81)
RDW: 18.2 % — ABNORMAL HIGH (ref 11.5–15.5)
WBC Count: 5.2 10*3/uL (ref 4.0–10.5)
nRBC: 0 % (ref 0.0–0.2)

## 2021-09-25 LAB — CMP (CANCER CENTER ONLY)
ALT: 7 U/L (ref 0–44)
AST: 10 U/L — ABNORMAL LOW (ref 15–41)
Albumin: 3.6 g/dL (ref 3.5–5.0)
Alkaline Phosphatase: 63 U/L (ref 38–126)
Anion gap: 5 (ref 5–15)
BUN: 11 mg/dL (ref 8–23)
CO2: 28 mmol/L (ref 22–32)
Calcium: 8.5 mg/dL — ABNORMAL LOW (ref 8.9–10.3)
Chloride: 107 mmol/L (ref 98–111)
Creatinine: 0.91 mg/dL (ref 0.61–1.24)
GFR, Estimated: >60 mL/min (ref >60)
Glucose, Bld: 110 mg/dL — ABNORMAL HIGH (ref 70–99)
Potassium: 3.8 mmol/L (ref 3.5–5.1)
Sodium: 140 mmol/L (ref 135–145)
Total Bilirubin: 0.4 mg/dL (ref 0.3–1.2)
Total Protein: 5.3 g/dL — ABNORMAL LOW (ref 6.5–8.1)

## 2021-09-25 LAB — MAGNESIUM: Magnesium: 1.9 mg/dL (ref 1.7–2.4)

## 2021-09-25 MED ORDER — HEPARIN SOD (PORK) LOCK FLUSH 100 UNIT/ML IV SOLN
500.0000 [IU] | Freq: Once | INTRAVENOUS | Status: AC
  Administered 2021-09-25: 500 [IU] via INTRAVENOUS

## 2021-09-25 MED ORDER — SODIUM CHLORIDE 0.9% FLUSH
10.0000 mL | Freq: Once | INTRAVENOUS | Status: AC
  Administered 2021-09-25: 10 mL via INTRAVENOUS

## 2021-09-25 MED ORDER — KETOROLAC TROMETHAMINE 15 MG/ML IJ SOLN
30.0000 mg | Freq: Once | INTRAMUSCULAR | Status: AC
  Administered 2021-09-25: 30 mg via INTRAVENOUS
  Filled 2021-09-25: qty 2

## 2021-09-25 NOTE — Patient Instructions (Signed)

## 2021-09-26 ENCOUNTER — Encounter: Payer: Self-pay | Admitting: Family Medicine

## 2021-09-26 ENCOUNTER — Ambulatory Visit: Payer: Self-pay

## 2021-09-26 ENCOUNTER — Ambulatory Visit (INDEPENDENT_AMBULATORY_CARE_PROVIDER_SITE_OTHER): Payer: Medicare Other | Admitting: Family Medicine

## 2021-09-26 VITALS — BP 118/52 | Ht 70.0 in | Wt 216.0 lb

## 2021-09-26 DIAGNOSIS — M533 Sacrococcygeal disorders, not elsewhere classified: Secondary | ICD-10-CM

## 2021-09-26 MED ORDER — TRIAMCINOLONE ACETONIDE 40 MG/ML IJ SUSP
40.0000 mg | Freq: Once | INTRAMUSCULAR | Status: AC
Start: 2021-09-26 — End: 2021-09-26
  Administered 2021-09-26: 40 mg via INTRA_ARTICULAR

## 2021-09-26 NOTE — Progress Notes (Signed)
Victor [name] - 85 y.o. male MRN [id number]  Date of birth: [birth year]  SUBJECTIVE:  Including CC & ROS.  No chief complaint on file.   Victor [name] is a 85 y.o. male that is presenting with acute on chronic left-sided SI joint pain.  Pain is similar to what is felt like in the past.  No injury or inciting event.   Review of Systems See HPI   HISTORY: Past Medical, Surgical, Social, and Family History Reviewed & Updated per EMR.   Pertinent Historical Findings include:  Past Medical History:  Diagnosis Date   Arthritis    Atherosclerosis    Atherosclerotic PVD with intermittent claudication (HCC)    stent left leg   BPH (benign prostatic hyperplasia)    Bulging lumbar disc    Cancer ([place]) 11/12/14   vocal cord  carcinoma in situ , radiation; thyroid cancer   Carotid bruit    COPD (chronic obstructive pulmonary disease) (HCC)    DDD (degenerative disc disease), lumbar    Dysphonia    Dysplasia of true vocal cord    GERD (gastroesophageal reflux disease)    H/O carotid atherosclerosis    b/l   Hyperlipidemia    Hypothyroidism    Occasional tremors    left hand managed with propranolol   Peripheral vascular angioplasty status with implants and grafts    Precancerous lesion 03/05/2018   premelanoma removed from back.    Radiation 03/10/15- 04/18/16   Larynx   Sleep apnea    hasn't used CPAP in years   Spondylosis of lumbosacral region    Thyroid disease     Past Surgical History:  Procedure Laterality Date   APPENDECTOMY     COLONOSCOPY W/ POLYPECTOMY     DIODE LASER APPLICATION Left 94/70/9628   Procedure: DIODE LASER APPLICATION;  Surgeon: Hayden Pedro, MD;  Location: [facility];  Service: Ophthalmology;  Laterality: Left;   IR IMAGING GUIDED PORT INSERTION  01/16/2021   IR IVC FILTER PLMT / S&I /IMG GUID/MOD SED  03/17/2021   MEMBRANE PEEL Left 08/18/2019   Procedure: MEMBRANE PEEL;  Surgeon: Hayden Pedro, MD;  Location: [facility];  Service: Ophthalmology;   Laterality: Left;   MOUTH SURGERY     tooth extraction   PARS PLANA VITRECTOMY Left 08/18/2019   PARS PLANA VITRECTOMY 27 GAUGE (Left)   PARS PLANA VITRECTOMY 27 GAUGE Left 08/18/2019   Procedure: PARS PLANA VITRECTOMY 27 GAUGE;  Surgeon: Hayden Pedro, MD;  Location: [facility];  Service: Ophthalmology;  Laterality: Left;   PHOTOCOAGULATION WITH LASER Left 08/18/2019   Procedure: PHOTOCOAGULATION WITH LASER;  Surgeon: Hayden Pedro, MD;  Location: [facility];  Service: Ophthalmology;  Laterality: Left;   THYROID SURGERY     TONSILLECTOMY     vocal cord biopsy  11/12/14   squamous cell carcinoma in situ    Family History  Problem Relation Age of Onset   Colon cancer Father    Cancer Sister        Died of metastatic cancer   Leukemia Neg Hx     Social History   Socioeconomic History   Marital status: Married    Spouse name: Not on file   Number of children: 2   Years of education: Not on file   Highest education level: Not on file  Occupational History   Not on file  Tobacco Use   Smoking status: Former    Packs/day: 2.00    Years: 51.00  Pack years: 102.00    Types: Cigarettes    Start date: 90    Quit date: 03/05/2004    Years since quitting: 17.5   Smokeless tobacco: Never  Vaping Use   Vaping Use: Never used  Substance and Sexual Activity   Alcohol use: Yes    Alcohol/week: 3.0 standard drinks    Types: 3 Glasses of wine per week    Comment: occ   Drug use: No   Sexual activity: Not on file  Other Topics Concern   Not on file  Social History Narrative   Not on file   Social Determinants of Health   Financial Resource Strain: Not on file  Food Insecurity: Not on file  Transportation Needs: Not on file  Physical Activity: Not on file  Stress: Not on file  Social Connections: Not on file  Intimate Partner Violence: Not on file     PHYSICAL EXAM:  VS: BP (!) 118/52 (BP Location: Left Arm, Patient Position: Sitting)   Ht 5\' 10"  (1.778 m)   Wt 216 lb  (98 kg)   BMI 30.99 kg/m  Physical Exam Gen: NAD, alert, cooperative with exam, well-appearing    Aspiration/Injection Procedure Note Victor [name] 10-28-1936  Procedure: Injection Indications: Left SI joint pain  Procedure Details Consent: Risks of procedure as well as the alternatives and risks of each were explained to the (patient/caregiver).  Consent for procedure obtained. Time Out: Verified patient identification, verified procedure, site/side was marked, verified correct patient position, special equipment/implants available, medications/allergies/relevent history reviewed, required imaging and test results available.  Performed.  The area was cleaned with iodine and alcohol swabs.    The left SI joint was injected using 3 cc 1% lidocaine on a 22-gauge 3-1/2 inch needle.  The syringe was switched to mixture containing 1 cc's of 40 mg Kenalog and 4 cc's of 0.25% Vivacaine was injected.  Ultrasound was needed to visualize the needle for placement into the joint.  Ultrasound was used. Images were obtained in long views showing the injection.     A sterile dressing was applied.  Patient did tolerate procedure well.    ASSESSMENT & PLAN:   Sacroiliac joint dysfunction of left side Acute on chronic in nature. Has gotten relief with previous injection.  - counseled on home exercise therapy and supportive care - injection today - could consider physical therapy

## 2021-09-26 NOTE — Patient Instructions (Signed)
Good to see you Happy Thanksgiving Please use ice as needed   Please send me a message in MyChart with any questions or updates.  Please see me back in 4-6 weeks or as needed if better.   --Dr. [name]

## 2021-09-26 NOTE — Addendum Note (Signed)
Addended by: Cresenciano Lick on: 09/26/2021 01:41 PM   Modules accepted: Orders

## 2021-09-26 NOTE — Assessment & Plan Note (Signed)
Acute on chronic in nature. Has gotten relief with previous injection.  - counseled on home exercise therapy and supportive care - injection today - could consider physical therapy

## 2021-10-02 ENCOUNTER — Encounter: Payer: Self-pay | Admitting: Hematology & Oncology

## 2021-10-02 ENCOUNTER — Inpatient Hospital Stay: Payer: Medicare Other

## 2021-10-02 ENCOUNTER — Other Ambulatory Visit: Payer: Self-pay

## 2021-10-02 ENCOUNTER — Inpatient Hospital Stay (HOSPITAL_BASED_OUTPATIENT_CLINIC_OR_DEPARTMENT_OTHER): Payer: Medicare Other | Admitting: Hematology & Oncology

## 2021-10-02 VITALS — BP 142/56 | HR 67 | Temp 98.0°F | Resp 17 | Wt 205.0 lb

## 2021-10-02 VITALS — BP 122/47 | HR 68 | Temp 98.3°F | Resp 17

## 2021-10-02 DIAGNOSIS — C9502 Acute leukemia of unspecified cell type, in relapse: Secondary | ICD-10-CM

## 2021-10-02 DIAGNOSIS — C9200 Acute myeloblastic leukemia, not having achieved remission: Secondary | ICD-10-CM | POA: Diagnosis not present

## 2021-10-02 DIAGNOSIS — C9500 Acute leukemia of unspecified cell type not having achieved remission: Secondary | ICD-10-CM

## 2021-10-02 LAB — SAVE SMEAR(SSMR), FOR PROVIDER SLIDE REVIEW

## 2021-10-02 LAB — CMP (CANCER CENTER ONLY)
ALT: 9 U/L (ref 0–44)
AST: 11 U/L — ABNORMAL LOW (ref 15–41)
Albumin: 4.2 g/dL (ref 3.5–5.0)
Alkaline Phosphatase: 72 U/L (ref 38–126)
Anion gap: 7 (ref 5–15)
BUN: 22 mg/dL (ref 8–23)
CO2: 27 mmol/L (ref 22–32)
Calcium: 9 mg/dL (ref 8.9–10.3)
Chloride: 106 mmol/L (ref 98–111)
Creatinine: 1.04 mg/dL (ref 0.61–1.24)
GFR, Estimated: >60 mL/min (ref >60)
Glucose, Bld: 136 mg/dL — ABNORMAL HIGH (ref 70–99)
Potassium: 3.9 mmol/L (ref 3.5–5.1)
Sodium: 140 mmol/L (ref 135–145)
Total Bilirubin: 0.6 mg/dL (ref 0.3–1.2)
Total Protein: 5.9 g/dL — ABNORMAL LOW (ref 6.5–8.1)

## 2021-10-02 LAB — CBC WITH DIFFERENTIAL (CANCER CENTER ONLY)
Abs Immature Granulocytes: 0.08 10*3/uL — ABNORMAL HIGH (ref 0.00–0.07)
Basophils Absolute: 0 10*3/uL (ref 0.0–0.1)
Basophils Relative: 0 %
Eosinophils Absolute: 0 10*3/uL (ref 0.0–0.5)
Eosinophils Relative: 0 %
HCT: 36.2 % — ABNORMAL LOW (ref 39.0–52.0)
Hemoglobin: 11.5 g/dL — ABNORMAL LOW (ref 13.0–17.0)
Immature Granulocytes: 1 %
Lymphocytes Relative: 18 %
Lymphs Abs: 1.2 10*3/uL (ref 0.7–4.0)
MCH: 30.7 pg (ref 26.0–34.0)
MCHC: 31.8 g/dL (ref 30.0–36.0)
MCV: 96.5 fL (ref 80.0–100.0)
Monocytes Absolute: 0.6 10*3/uL (ref 0.1–1.0)
Monocytes Relative: 9 %
Neutro Abs: 5 10*3/uL (ref 1.7–7.7)
Neutrophils Relative %: 72 %
Platelet Count: 116 10*3/uL — ABNORMAL LOW (ref 150–400)
RBC: 3.75 MIL/uL — ABNORMAL LOW (ref 4.22–5.81)
RDW: 18.7 % — ABNORMAL HIGH (ref 11.5–15.5)
WBC Count: 6.9 10*3/uL (ref 4.0–10.5)
nRBC: 0 % (ref 0.0–0.2)

## 2021-10-02 LAB — LACTATE DEHYDROGENASE: LDH: 152 U/L (ref 98–192)

## 2021-10-02 MED ORDER — HEPARIN SOD (PORK) LOCK FLUSH 100 UNIT/ML IV SOLN
500.0000 [IU] | Freq: Once | INTRAVENOUS | Status: AC | PRN
  Administered 2021-10-02: 12:00:00 500 [IU]

## 2021-10-02 MED ORDER — SODIUM CHLORIDE 0.9 % IV SOLN
10.0000 mg | Freq: Once | INTRAVENOUS | Status: AC
  Administered 2021-10-02: 10:00:00 10 mg via INTRAVENOUS
  Filled 2021-10-02: qty 10

## 2021-10-02 MED ORDER — SODIUM CHLORIDE 0.9 % IV SOLN
Freq: Once | INTRAVENOUS | Status: AC
Start: 2021-10-02 — End: 2021-10-02

## 2021-10-02 MED ORDER — SODIUM CHLORIDE 0.9 % IV SOLN
49.5000 mg/m2 | Freq: Once | INTRAVENOUS | Status: AC
  Administered 2021-10-02: 11:00:00 100 mg via INTRAVENOUS
  Filled 2021-10-02: qty 10

## 2021-10-02 MED ORDER — SODIUM CHLORIDE 0.9% FLUSH
10.0000 mL | INTRAVENOUS | Status: DC | PRN
  Administered 2021-10-02: 12:00:00 10 mL

## 2021-10-02 MED ORDER — PALONOSETRON HCL INJECTION 0.25 MG/5ML
0.2500 mg | Freq: Once | INTRAVENOUS | Status: AC
  Administered 2021-10-02: 10:00:00 0.25 mg via INTRAVENOUS
  Filled 2021-10-02: qty 5

## 2021-10-02 NOTE — Patient Instructions (Signed)

## 2021-10-02 NOTE — Patient Instructions (Signed)
[facility] [place]  Discharge Instructions: Thank you for choosing [facility] to provide your oncology and hematology care.   If you have a lab appointment with the [facility], please go directly to the [facility] and check in at the registration area.  Wear comfortable clothing and clothing appropriate for easy access to any Portacath or PICC line.   We strive to give you quality time with your provider. You may need to reschedule your appointment if you arrive late (15 or more minutes).  Arriving late affects you and other patients whose appointments are after yours.  Also, if you miss three or more appointments without notifying the office, you may be dismissed from the clinic at the provider's discretion.      For prescription refill requests, have your pharmacy contact our office and allow 72 hours for refills to be completed.    Today you received the following chemotherapy and/or immunotherapy agents Vidaza      To help prevent nausea and vomiting after your treatment, we encourage you to take your nausea medication as directed.  BELOW ARE SYMPTOMS THAT SHOULD BE REPORTED IMMEDIATELY: *FEVER GREATER THAN 100.4 F (38 C) OR HIGHER *CHILLS OR SWEATING *NAUSEA AND VOMITING THAT IS NOT CONTROLLED WITH YOUR NAUSEA MEDICATION *UNUSUAL SHORTNESS OF BREATH *UNUSUAL BRUISING OR BLEEDING *URINARY PROBLEMS (pain or burning when urinating, or frequent urination) *BOWEL PROBLEMS (unusual diarrhea, constipation, pain near the anus) TENDERNESS IN MOUTH AND THROAT WITH OR WITHOUT PRESENCE OF ULCERS (sore throat, sores in mouth, or a toothache) UNUSUAL RASH, SWELLING OR PAIN  UNUSUAL VAGINAL DISCHARGE OR ITCHING   Items with * indicate a potential emergency and should be followed up as soon as possible or go to the Emergency Department if any problems should occur.  Please show the CHEMOTHERAPY ALERT CARD or IMMUNOTHERAPY ALERT CARD at check-in to the  Emergency Department and triage nurse. Should you have questions after your visit or need to cancel or reschedule your appointment, please contact [facility]  [phone] and follow the prompts.  Office hours are 8:00 a.m. to 4:30 p.m. Monday - Friday. Please note that voicemails left after 4:00 p.m. may not be returned until the following business day.  We are closed weekends and major holidays. You have access to a nurse at all times for urgent questions. Please call the main number to the clinic [phone] and follow the prompts.  For any non-urgent questions, you may also contact your provider using MyChart. We now offer e-Visits for anyone 83 and older to request care online for non-urgent symptoms. For details visit [email or web address].   Also download the MyChart app! Go to the app store, search "MyChart", open the app, select [facility], and log in with your MyChart username and password.  Due to Covid, a mask is required upon entering the hospital/clinic. If you do not have a mask, one will be given to you upon arrival. For doctor visits, patients may have 1 support person aged 83 or older with them. For treatment visits, patients cannot have anyone with them due to current Covid guidelines and our immunocompromised population.

## 2021-10-03 ENCOUNTER — Other Ambulatory Visit: Payer: Self-pay | Admitting: Hematology & Oncology

## 2021-10-03 ENCOUNTER — Inpatient Hospital Stay: Payer: Medicare Other

## 2021-10-03 VITALS — BP 121/45 | HR 71 | Temp 97.7°F | Resp 20

## 2021-10-03 DIAGNOSIS — C9500 Acute leukemia of unspecified cell type not having achieved remission: Secondary | ICD-10-CM

## 2021-10-03 DIAGNOSIS — G629 Polyneuropathy, unspecified: Secondary | ICD-10-CM

## 2021-10-03 DIAGNOSIS — C9200 Acute myeloblastic leukemia, not having achieved remission: Secondary | ICD-10-CM | POA: Diagnosis not present

## 2021-10-03 MED ORDER — SODIUM CHLORIDE 0.9 % IV SOLN
10.0000 mg | Freq: Once | INTRAVENOUS | Status: AC
  Administered 2021-10-03: 10 mg via INTRAVENOUS
  Filled 2021-10-03: qty 10

## 2021-10-03 MED ORDER — SODIUM CHLORIDE 0.9 % IV SOLN
49.5000 mg/m2 | Freq: Once | INTRAVENOUS | Status: AC
  Administered 2021-10-03: 100 mg via INTRAVENOUS
  Filled 2021-10-03: qty 10

## 2021-10-03 MED ORDER — SODIUM CHLORIDE 0.9 % IV SOLN
Freq: Once | INTRAVENOUS | Status: AC
Start: 2021-10-03 — End: 2021-10-03

## 2021-10-03 MED ORDER — SODIUM CHLORIDE 0.9% FLUSH
10.0000 mL | INTRAVENOUS | Status: DC | PRN
  Administered 2021-10-03: 10 mL

## 2021-10-03 MED ORDER — HEPARIN SOD (PORK) LOCK FLUSH 100 UNIT/ML IV SOLN
500.0000 [IU] | Freq: Once | INTRAVENOUS | Status: AC | PRN
  Administered 2021-10-03: 500 [IU]

## 2021-10-03 NOTE — Patient Instructions (Signed)
[facility] [place]  Discharge Instructions: Thank you for choosing [facility] to provide your oncology and hematology care.   If you have a lab appointment with the [facility], please go directly to the [facility] and check in at the registration area.  Wear comfortable clothing and clothing appropriate for easy access to any Portacath or PICC line.   We strive to give you quality time with your provider. You may need to reschedule your appointment if you arrive late (15 or more minutes).  Arriving late affects you and other patients whose appointments are after yours.  Also, if you miss three or more appointments without notifying the office, you may be dismissed from the clinic at the provider's discretion.      For prescription refill requests, have your pharmacy contact our office and allow 72 hours for refills to be completed.    Today you received the following chemotherapy and/or immunotherapy agents 5-azacitidine   To help prevent nausea and vomiting after your treatment, we encourage you to take your nausea medication as directed.  BELOW ARE SYMPTOMS THAT SHOULD BE REPORTED IMMEDIATELY: *FEVER GREATER THAN 100.4 F (38 C) OR HIGHER *CHILLS OR SWEATING *NAUSEA AND VOMITING THAT IS NOT CONTROLLED WITH YOUR NAUSEA MEDICATION *UNUSUAL SHORTNESS OF BREATH *UNUSUAL BRUISING OR BLEEDING *URINARY PROBLEMS (pain or burning when urinating, or frequent urination) *BOWEL PROBLEMS (unusual diarrhea, constipation, pain near the anus) TENDERNESS IN MOUTH AND THROAT WITH OR WITHOUT PRESENCE OF ULCERS (sore throat, sores in mouth, or a toothache) UNUSUAL RASH, SWELLING OR PAIN  UNUSUAL VAGINAL DISCHARGE OR ITCHING   Items with * indicate a potential emergency and should be followed up as soon as possible or go to the Emergency Department if any problems should occur.  Please show the CHEMOTHERAPY ALERT CARD or IMMUNOTHERAPY ALERT CARD at check-in to the  Emergency Department and triage nurse. Should you have questions after your visit or need to cancel or reschedule your appointment, please contact [facility]  [phone] and follow the prompts.  Office hours are 8:00 a.m. to 4:30 p.m. Monday - Friday. Please note that voicemails left after 4:00 p.m. may not be returned until the following business day.  We are closed weekends and major holidays. You have access to a nurse at all times for urgent questions. Please call the main number to the clinic [phone] and follow the prompts.  For any non-urgent questions, you may also contact your provider using MyChart. We now offer e-Visits for anyone 104 and older to request care online for non-urgent symptoms. For details visit [email or web address].   Also download the MyChart app! Go to the app store, search "MyChart", open the app, select [facility], and log in with your MyChart username and password.  Due to Covid, a mask is required upon entering the hospital/clinic. If you do not have a mask, one will be given to you upon arrival. For doctor visits, patients may have 1 support person aged 40 or older with them. For treatment visits, patients cannot have anyone with them due to current Covid guidelines and our immunocompromised population.

## 2021-10-04 ENCOUNTER — Encounter: Payer: Self-pay | Admitting: Oncology

## 2021-10-04 ENCOUNTER — Other Ambulatory Visit: Payer: Self-pay

## 2021-10-04 ENCOUNTER — Inpatient Hospital Stay: Payer: Medicare Other

## 2021-10-04 VITALS — BP 132/68 | HR 55 | Temp 98.4°F | Resp 18

## 2021-10-04 DIAGNOSIS — C9500 Acute leukemia of unspecified cell type not having achieved remission: Secondary | ICD-10-CM

## 2021-10-04 DIAGNOSIS — C9200 Acute myeloblastic leukemia, not having achieved remission: Secondary | ICD-10-CM | POA: Diagnosis not present

## 2021-10-04 MED ORDER — SODIUM CHLORIDE 0.9 % IV SOLN
10.0000 mg | Freq: Once | INTRAVENOUS | Status: AC
  Administered 2021-10-04: 10 mg via INTRAVENOUS
  Filled 2021-10-04: qty 10

## 2021-10-04 MED ORDER — SODIUM CHLORIDE 0.9 % IV SOLN
Freq: Once | INTRAVENOUS | Status: AC
Start: 2021-10-04 — End: 2021-10-04

## 2021-10-04 MED ORDER — PALONOSETRON HCL INJECTION 0.25 MG/5ML
0.2500 mg | Freq: Once | INTRAVENOUS | Status: AC
  Administered 2021-10-04: 0.25 mg via INTRAVENOUS
  Filled 2021-10-04: qty 5

## 2021-10-04 MED ORDER — HEPARIN SOD (PORK) LOCK FLUSH 100 UNIT/ML IV SOLN: 500.0000 [IU] | Freq: Once | INTRAVENOUS | Status: DC | PRN

## 2021-10-04 MED ORDER — SODIUM CHLORIDE 0.9% FLUSH: 10.0000 mL | INTRAVENOUS | Status: DC | PRN

## 2021-10-04 MED ORDER — SODIUM CHLORIDE 0.9 % IV SOLN
49.0000 mg/m2 | Freq: Once | INTRAVENOUS | Status: AC
  Administered 2021-10-04: 100 mg via INTRAVENOUS
  Filled 2021-10-04: qty 10

## 2021-10-04 NOTE — Patient Instructions (Signed)
[facility] [place]  Discharge Instructions: Thank you for choosing [facility] to provide your oncology and hematology care.   If you have a lab appointment with the [facility], please go directly to the [facility] and check in at the registration area.  Wear comfortable clothing and clothing appropriate for easy access to any Portacath or PICC line.   We strive to give you quality time with your provider. You may need to reschedule your appointment if you arrive late (15 or more minutes).  Arriving late affects you and other patients whose appointments are after yours.  Also, if you miss three or more appointments without notifying the office, you may be dismissed from the clinic at the provider's discretion.      For prescription refill requests, have your pharmacy contact our office and allow 72 hours for refills to be completed.    Today you received the following chemotherapy and/or immunotherapy agents Vidaza.   To help prevent nausea and vomiting after your treatment, we encourage you to take your nausea medication as directed.  BELOW ARE SYMPTOMS THAT SHOULD BE REPORTED IMMEDIATELY: *FEVER GREATER THAN 100.4 F (38 C) OR HIGHER *CHILLS OR SWEATING *NAUSEA AND VOMITING THAT IS NOT CONTROLLED WITH YOUR NAUSEA MEDICATION *UNUSUAL SHORTNESS OF BREATH *UNUSUAL BRUISING OR BLEEDING *URINARY PROBLEMS (pain or burning when urinating, or frequent urination) *BOWEL PROBLEMS (unusual diarrhea, constipation, pain near the anus) TENDERNESS IN MOUTH AND THROAT WITH OR WITHOUT PRESENCE OF ULCERS (sore throat, sores in mouth, or a toothache) UNUSUAL RASH, SWELLING OR PAIN  UNUSUAL VAGINAL DISCHARGE OR ITCHING   Items with * indicate a potential emergency and should be followed up as soon as possible or go to the Emergency Department if any problems should occur.  Please show the CHEMOTHERAPY ALERT CARD or IMMUNOTHERAPY ALERT CARD at check-in to the  Emergency Department and triage nurse. Should you have questions after your visit or need to cancel or reschedule your appointment, please contact [facility]  [phone] and follow the prompts.  Office hours are 8:00 a.m. to 4:30 p.m. Monday - Friday. Please note that voicemails left after 4:00 p.m. may not be returned until the following business day.  We are closed weekends and major holidays. You have access to a nurse at all times for urgent questions. Please call the main number to the clinic [phone] and follow the prompts.  For any non-urgent questions, you may also contact your provider using MyChart. We now offer e-Visits for anyone 26 and older to request care online for non-urgent symptoms. For details visit [email or web address].   Also download the MyChart app! Go to the app store, search "MyChart", open the app, select [facility], and log in with your MyChart username and password.  Due to Covid, a mask is required upon entering the hospital/clinic. If you do not have a mask, one will be given to you upon arrival. For doctor visits, patients may have 1 support person aged 26 or older with them. For treatment visits, patients cannot have anyone with them due to current Covid guidelines and our immunocompromised population.

## 2021-10-04 NOTE — Progress Notes (Signed)
Hematology and Oncology Follow Up Visit  Victor [name] [id number] 06-10-1936 85 y.o. 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 twice daily --started on 03/16/2021     Interim History:  Victor [name] is back for follow-up.  Victor [name] is doing quite nicely.  Victor [name] comes in with his wife.  Victor [name] sees Dr. Linus Orn at [facility] before Victor [name] comes in for his cycle of treatment.  Dr. Linus Orn is very pleased with how well Victor [name] is doing.  Victor [name] does have the neuropathy issues.  We will increase the gabapentin up to 600 mg 3 times a day.  Victor [name] thinks this will help.  Victor [name] does have the back issues.  This is more from arthritis.  I have on low-dose Eliquis.  I want to keep him on low-dose Eliquis as long as Victor [name] is being treated for his leukemia.  Victor [name] did have a nice Thanksgiving.  Victor [name] is eating well.  Victor [name] is having no change in bowel or bladder habits..  There is no rashes.  Overall, his performance status is ECOG 1.    Medications:  Current Outpatient Medications:    gabapentin (NEURONTIN) 300 MG capsule, Take 2 capsules (600 mg total) by mouth 3 (three) times daily., Disp: 240 capsule, Rfl: 4   acetaminophen (TYLENOL) 500 MG tablet, Take 1,000 mg by mouth every 6 (six) hours as needed for moderate pain., Disp: , Rfl:    apixaban (ELIQUIS) 2.5 MG TABS tablet, Take 1 tablet (2.5 mg total) by mouth 2 (two) times daily., Disp: 60 tablet, Rfl: 6   doxazosin (CARDURA) 2 MG tablet, Take 1 tablet (2 mg total) by mouth at bedtime., Disp: 90 tablet, Rfl: 3   HYDROcodone-acetaminophen (NORCO/VICODIN) 5-325 MG tablet, Take 1 tablet by mouth every 6 (six) hours as needed for moderate pain., Disp: , Rfl:    ipratropium (ATROVENT) 0.06 % nasal spray, Place into both nostrils 2 (two) times daily as needed., Disp: , Rfl:    ketoconazole (NIZORAL) 2 % shampoo,  Apply 1 application topically 2 (two) times a week., Disp: , Rfl:    levothyroxine (SYNTHROID) 150 MCG tablet, Take 1 tablet (150 mcg total) by mouth daily., Disp: 90 tablet, Rfl: 3   Lidocaine 4 % PTCH, Apply 0.5 patches topically daily as needed (pain). Uses 1/2 patch daily., Disp: , Rfl:    lidocaine-prilocaine (EMLA) cream, Apply 1 application topically as needed for up to 30 doses. (Patient taking differently: Apply 1 application topically daily as needed (port access).), Disp: 30 g, Rfl: 0   Loratadine (CLARITIN PO), Take 10 mg by mouth at bedtime. Allertec, Disp: , Rfl:    Melatonin 5 MG CHEW, Chew 5 mg by mouth daily., Disp: , Rfl:    potassium chloride (MICRO-K) 10 MEQ CR capsule, Take 10 mEq by mouth in the morning and at bedtime., Disp: , Rfl:    propranolol ER (INDERAL LA) 80 MG 24 hr capsule, Take 1 capsule (80 mg total) by mouth daily., Disp: 90 capsule, Rfl: 0   RESTASIS 0.05 % ophthalmic emulsion, Place 1 drop into both eyes 2 (two) times daily., Disp: , Rfl:    simvastatin (ZOCOR) 40 MG tablet, TAKE ONE TABLET BY MOUTH DAILY AT BEDTIME (Patient taking differently: Take 40 mg by mouth daily at 6 PM.), Disp: 90 tablet, Rfl: 3   venetoclax 100 MG TABS, Take 400 mg  by mouth daily. Takes 400 mg daily for 2 weeks and stops for 4 weeks., Disp: , Rfl:   Allergies:  Allergies  Allergen Reactions   Augmentin [Amoxicillin-Pot Clavulanate] Diarrhea    SOB    Past Medical History, Surgical history, Social history, and Family History were reviewed and updated.  Review of Systems: Review of Systems  Constitutional: Negative.   HENT:  Negative.    Eyes: Negative.   Respiratory: Negative.    Cardiovascular: Negative.   Gastrointestinal: Negative.   Endocrine: Negative.   Genitourinary: Negative.    Musculoskeletal: Negative.   Skin: Negative.   Neurological: Negative.   Hematological: Negative.   Psychiatric/Behavioral: Negative.     Physical Exam:  weight is 205 lb (93 kg). His  oral temperature is 98 F (36.7 C). His blood pressure is 142/56 (abnormal) and his pulse is 67. His respiration is 17 and oxygen saturation is 97%.   Wt Readings from Last 3 Encounters:  10/02/21 205 lb (93 kg)  09/26/21 216 lb (98 kg)  09/08/21 216 lb (98 kg)    Physical Exam Vitals reviewed.  HENT:     Head: Normocephalic and atraumatic.  Eyes:     Pupils: Pupils are equal, round, and reactive to light.  Cardiovascular:     Rate and Rhythm: Normal rate and regular rhythm.     Heart sounds: Normal heart sounds.  Pulmonary:     Effort: Pulmonary effort is normal.     Breath sounds: Normal breath sounds.  Abdominal:     General: Bowel sounds are normal.     Palpations: Abdomen is soft.  Musculoskeletal:        General: No tenderness or deformity. Normal range of motion.     Cervical back: Normal range of motion.  Lymphadenopathy:     Cervical: No cervical adenopathy.  Skin:    General: Skin is warm and dry.     Findings: No erythema or rash.  Neurological:     Mental Status: Victor [name] is alert and oriented to person, place, and time.  Psychiatric:        Behavior: Behavior normal.        Thought Content: Thought content normal.        Judgment: Judgment normal.     Lab Results  Component Value Date   WBC 6.9 10/02/2021   HGB 11.5 (L) 10/02/2021   HCT 36.2 (L) 10/02/2021   MCV 96.5 10/02/2021   PLT 116 (L) 10/02/2021     Chemistry      Component Value Date/Time   NA 140 10/02/2021 0845   NA 142 03/30/2015 1149   K 3.9 10/02/2021 0845   K 4.5 03/30/2015 1149   CL 106 10/02/2021 0845   CO2 27 10/02/2021 0845   CO2 25 03/30/2015 1149   BUN 22 10/02/2021 0845   BUN 17.1 03/30/2015 1149   CREATININE 1.04 10/02/2021 0845   CREATININE 1.2 03/30/2015 1149      Component Value Date/Time   CALCIUM 9.0 10/02/2021 0845   CALCIUM 8.7 03/30/2015 1149   ALKPHOS 72 10/02/2021 0845   AST 11 (L) 10/02/2021 0845   ALT 9 10/02/2021 0845   BILITOT 0.6 10/02/2021 0845        Impression and Plan: Victor [name] is a very nice 85 year old white male who has acute myeloid leukemia.  Victor [name] does have a adverse prognostic feature with respect to the FLT3 mutation.  I know there are targeted therapies for patients who have leukemia with  this FLT3 mutation.  Again, Dr. Linus Orn is doing a fantastic job with him.  Victor [name] is responding well to the Vidaza and the venetoclax.  There is no indication that we have to put him on one of the new targeted therapy.  Victor [name] will start his 10th cycle of treatment.  Victor [name] only gets the venetoclax only 7 days.  Victor [name] gets a Vidaza for 5 days.  We will continue to follow his blood counts weekly.  We will plan to get him back in another 6 weeks.  Volanda Napoleon, MD 11/30/20227:11 AM

## 2021-10-05 ENCOUNTER — Inpatient Hospital Stay: Payer: Medicare Other | Attending: Oncology

## 2021-10-05 VITALS — BP 130/51 | HR 67 | Temp 98.0°F | Resp 17

## 2021-10-05 DIAGNOSIS — Z79899 Other long term (current) drug therapy: Secondary | ICD-10-CM | POA: Insufficient documentation

## 2021-10-05 DIAGNOSIS — Z5111 Encounter for antineoplastic chemotherapy: Secondary | ICD-10-CM | POA: Diagnosis present

## 2021-10-05 DIAGNOSIS — C9200 Acute myeloblastic leukemia, not having achieved remission: Secondary | ICD-10-CM | POA: Diagnosis not present

## 2021-10-05 DIAGNOSIS — C9500 Acute leukemia of unspecified cell type not having achieved remission: Secondary | ICD-10-CM

## 2021-10-05 MED ORDER — SODIUM CHLORIDE 0.9 % IV SOLN
Freq: Once | INTRAVENOUS | Status: AC
Start: 2021-10-05 — End: 2021-10-05

## 2021-10-05 MED ORDER — SODIUM CHLORIDE 0.9% FLUSH
10.0000 mL | INTRAVENOUS | Status: DC | PRN
  Administered 2021-10-05: 10 mL

## 2021-10-05 MED ORDER — HEPARIN SOD (PORK) LOCK FLUSH 100 UNIT/ML IV SOLN
500.0000 [IU] | Freq: Once | INTRAVENOUS | Status: AC | PRN
  Administered 2021-10-05: 500 [IU]

## 2021-10-05 MED ORDER — SODIUM CHLORIDE 0.9 % IV SOLN
10.0000 mg | Freq: Once | INTRAVENOUS | Status: AC
  Administered 2021-10-05: 10 mg via INTRAVENOUS
  Filled 2021-10-05: qty 10

## 2021-10-05 MED ORDER — SODIUM CHLORIDE 0.9 % IV SOLN
49.5000 mg/m2 | Freq: Once | INTRAVENOUS | Status: AC
  Administered 2021-10-05: 100 mg via INTRAVENOUS
  Filled 2021-10-05: qty 10

## 2021-10-05 NOTE — Patient Instructions (Signed)
Azacitidine suspension for injection (subcutaneous use) What is this medication? AZACITIDINE (ay za SITE i deen) is a chemotherapy drug. This medicine reduces the growth of cancer cells and can suppress the immune system. It is used for treating myelodysplastic syndrome or some types of leukemia. This medicine may be used for other purposes; ask your health care provider or pharmacist if you have questions. COMMON BRAND NAME(S): Vidaza What should I tell my care team before I take this medication? They need to know if you have any of these conditions: kidney disease liver disease liver tumors an unusual or allergic reaction to azacitidine, mannitol, other medicines, foods, dyes, or preservatives pregnant or trying to get pregnant breast-feeding How should I use this medication? This medicine is for injection under the skin. It is administered in a hospital or clinic by a specially trained health care professional. Talk to your pediatrician regarding the use of this medicine in children. While this drug may be prescribed for selected conditions, precautions do apply. Overdosage: If you think you have taken too much of this medicine contact a poison control center or emergency room at once. NOTE: This medicine is only for you. Do not share this medicine with others. What if I miss a dose? It is important not to miss your dose. Call your doctor or health care professional if you are unable to keep an appointment. What may interact with this medication? Interactions have not been studied. Give your health care provider a list of all the medicines, herbs, non-prescription drugs, or dietary supplements you use. Also tell them if you smoke, drink alcohol, or use illegal drugs. Some items may interact with your medicine. This list may not describe all possible interactions. Give your health care provider a list of all the medicines, herbs, non-prescription drugs, or dietary supplements you use. Also  tell them if you smoke, drink alcohol, or use illegal drugs. Some items may interact with your medicine. What should I watch for while using this medication? Visit your doctor for checks on your progress. This drug may make you feel generally unwell. This is not uncommon, as chemotherapy can affect healthy cells as well as cancer cells. Report any side effects. Continue your course of treatment even though you feel ill unless your doctor tells you to stop. In some cases, you may be given additional medicines to help with side effects. Follow all directions for their use. Call your doctor or health care professional for advice if you get a fever, chills or sore throat, or other symptoms of a cold or flu. Do not treat yourself. This drug decreases your body's ability to fight infections. Try to avoid being around people who are sick. This medicine may increase your risk to bruise or bleed. Call your doctor or health care professional if you notice any unusual bleeding. You may need blood work done while you are taking this medicine. Do not become pregnant while taking this medicine and for 6 months after the last dose. Women should inform their doctor if they wish to become pregnant or think they might be pregnant. Men should not father a child while taking this medicine and for 3 months after the last dose. There is a potential for serious side effects to an unborn child. Talk to your health care professional or pharmacist for more information. Do not breast-feed an infant while taking this medicine and for 1 week after the last dose. This medicine may interfere with the ability to have a child. Talk   with your doctor or health care professional if you are concerned about your fertility. What side effects may I notice from receiving this medication? Side effects that you should report to your doctor or health care professional as soon as possible: allergic reactions like skin rash, itching or hives,  swelling of the face, lips, or tongue low blood counts - this medicine may decrease the number of white blood cells, red blood cells and platelets. You may be at increased risk for infections and bleeding. signs of infection - fever or chills, cough, sore throat, pain passing urine signs of decreased platelets or bleeding - bruising, pinpoint red spots on the skin, black, tarry stools, blood in the urine signs of decreased red blood cells - unusually weak or tired, fainting spells, lightheadedness signs and symptoms of kidney injury like trouble passing urine or change in the amount of urine signs and symptoms of liver injury like dark yellow or brown urine; general ill feeling or flu-like symptoms; light-colored stools; loss of appetite; nausea; right upper belly pain; unusually weak or tired; yellowing of the eyes or skin Side effects that usually do not require medical attention (report to your doctor or health care professional if they continue or are bothersome): constipation diarrhea nausea, vomiting pain or redness at the injection site unusually weak or tired This list may not describe all possible side effects. Call your doctor for medical advice about side effects. You may report side effects to FDA at 1-800-FDA-1088. Where should I keep my medication? This drug is given in a hospital or clinic and will not be stored at home. NOTE: This sheet is a summary. It may not cover all possible information. If you have questions about this medicine, talk to your doctor, pharmacist, or health care provider.  2022 Elsevier/Gold Standard (2016-11-21 00:00:00)

## 2021-10-06 ENCOUNTER — Other Ambulatory Visit: Payer: Self-pay | Admitting: *Deleted

## 2021-10-06 ENCOUNTER — Other Ambulatory Visit: Payer: Self-pay

## 2021-10-06 ENCOUNTER — Inpatient Hospital Stay: Payer: Medicare Other

## 2021-10-06 VITALS — BP 159/59 | HR 66 | Temp 97.7°F | Resp 16

## 2021-10-06 DIAGNOSIS — C9500 Acute leukemia of unspecified cell type not having achieved remission: Secondary | ICD-10-CM

## 2021-10-06 DIAGNOSIS — C969 Malignant neoplasm of lymphoid, hematopoietic and related tissue, unspecified: Secondary | ICD-10-CM

## 2021-10-06 DIAGNOSIS — C9200 Acute myeloblastic leukemia, not having achieved remission: Secondary | ICD-10-CM | POA: Diagnosis not present

## 2021-10-06 DIAGNOSIS — Z95828 Presence of other vascular implants and grafts: Secondary | ICD-10-CM

## 2021-10-06 MED ORDER — PALONOSETRON HCL INJECTION 0.25 MG/5ML
0.2500 mg | Freq: Once | INTRAVENOUS | Status: AC
  Administered 2021-10-06: 0.25 mg via INTRAVENOUS
  Filled 2021-10-06: qty 5

## 2021-10-06 MED ORDER — SODIUM CHLORIDE 0.9 % IV SOLN
49.5000 mg/m2 | Freq: Once | INTRAVENOUS | Status: AC
  Administered 2021-10-06: 100 mg via INTRAVENOUS
  Filled 2021-10-06: qty 10

## 2021-10-06 MED ORDER — HEPARIN SOD (PORK) LOCK FLUSH 100 UNIT/ML IV SOLN
500.0000 [IU] | Freq: Once | INTRAVENOUS | Status: AC | PRN
  Administered 2021-10-06: 500 [IU]

## 2021-10-06 MED ORDER — SODIUM CHLORIDE 0.9 % IV SOLN
10.0000 mg | Freq: Once | INTRAVENOUS | Status: AC
  Administered 2021-10-06: 10 mg via INTRAVENOUS
  Filled 2021-10-06: qty 10

## 2021-10-06 MED ORDER — SODIUM CHLORIDE 0.9 % IV SOLN
Freq: Once | INTRAVENOUS | Status: AC
Start: 2021-10-06 — End: 2021-10-06

## 2021-10-06 MED ORDER — SODIUM CHLORIDE 0.9% FLUSH
10.0000 mL | INTRAVENOUS | Status: DC | PRN
  Administered 2021-10-06: 10 mL

## 2021-10-06 NOTE — Patient Instructions (Signed)
[facility]  Discharge Instructions: Thank you for choosing [facility] to provide your oncology and hematology care.   If you have a lab appointment with the [facility], please go directly to the [facility] and check in at the registration area.  Wear comfortable clothing and clothing appropriate for easy access to any Portacath or PICC line.   We strive to give you quality time with your provider. You may need to reschedule your appointment if you arrive late (15 or more minutes).  Arriving late affects you and other patients whose appointments are after yours.  Also, if you miss three or more appointments without notifying the office, you may be dismissed from the clinic at the provider's discretion.      For prescription refill requests, have your pharmacy contact our office and allow 72 hours for refills to be completed.    Today you received the following chemotherapy and/or immunotherapy agents Vidaza      To help prevent nausea and vomiting after your treatment, we encourage you to take your nausea medication as directed.  BELOW ARE SYMPTOMS THAT SHOULD BE REPORTED IMMEDIATELY: *FEVER GREATER THAN 100.4 F (38 C) OR HIGHER *CHILLS OR SWEATING *NAUSEA AND VOMITING THAT IS NOT CONTROLLED WITH YOUR NAUSEA MEDICATION *UNUSUAL SHORTNESS OF BREATH *UNUSUAL BRUISING OR BLEEDING *URINARY PROBLEMS (pain or burning when urinating, or frequent urination) *BOWEL PROBLEMS (unusual diarrhea, constipation, pain near the anus) TENDERNESS IN MOUTH AND THROAT WITH OR WITHOUT PRESENCE OF ULCERS (sore throat, sores in mouth, or a toothache) UNUSUAL RASH, SWELLING OR PAIN  UNUSUAL VAGINAL DISCHARGE OR ITCHING   Items with * indicate a potential emergency and should be followed up as soon as possible or go to the Emergency Department if any problems should occur.  Please show the CHEMOTHERAPY ALERT CARD or IMMUNOTHERAPY ALERT CARD at check-in to the  Emergency Department and triage nurse. Should you have questions after your visit or need to cancel or reschedule your appointment, please contact [facility]  [phone] and follow the prompts.  Office hours are 8:00 a.m. to 4:30 p.m. Monday - Friday. Please note that voicemails left after 4:00 p.m. may not be returned until the following business day.  We are closed weekends and major holidays. You have access to a nurse at all times for urgent questions. Please call the main number to the clinic [phone] and follow the prompts.  For any non-urgent questions, you may also contact your provider using MyChart. We now offer e-Visits for anyone 107 and older to request care online for non-urgent symptoms. For details visit [email or web address].   Also download the MyChart app! Go to the app store, search "MyChart", open the app, select [facility], and log in with your MyChart username and password.  Due to Covid, a mask is required upon entering the hospital/clinic. If you do not have a mask, one will be given to you upon arrival. For doctor visits, patients may have 1 support person aged 10 or older with them. For treatment visits, patients cannot have anyone with them due to current Covid guidelines and our immunocompromised population.

## 2021-10-09 ENCOUNTER — Inpatient Hospital Stay: Payer: Medicare Other

## 2021-10-09 ENCOUNTER — Other Ambulatory Visit: Payer: Self-pay

## 2021-10-09 VITALS — BP 145/50 | HR 77 | Temp 98.0°F | Resp 17

## 2021-10-09 DIAGNOSIS — Z452 Encounter for adjustment and management of vascular access device: Secondary | ICD-10-CM

## 2021-10-09 DIAGNOSIS — C9200 Acute myeloblastic leukemia, not having achieved remission: Secondary | ICD-10-CM | POA: Diagnosis not present

## 2021-10-09 DIAGNOSIS — Z95828 Presence of other vascular implants and grafts: Secondary | ICD-10-CM

## 2021-10-09 DIAGNOSIS — C969 Malignant neoplasm of lymphoid, hematopoietic and related tissue, unspecified: Secondary | ICD-10-CM

## 2021-10-09 DIAGNOSIS — C9500 Acute leukemia of unspecified cell type not having achieved remission: Secondary | ICD-10-CM

## 2021-10-09 LAB — CBC WITH DIFFERENTIAL (CANCER CENTER ONLY)
Abs Immature Granulocytes: 0.1 10*3/uL — ABNORMAL HIGH (ref 0.00–0.07)
Basophils Absolute: 0 10*3/uL (ref 0.0–0.1)
Basophils Relative: 0 %
Eosinophils Absolute: 0 10*3/uL (ref 0.0–0.5)
Eosinophils Relative: 0 %
HCT: 34.9 % — ABNORMAL LOW (ref 39.0–52.0)
Hemoglobin: 11.1 g/dL — ABNORMAL LOW (ref 13.0–17.0)
Immature Granulocytes: 2 %
Lymphocytes Relative: 15 %
Lymphs Abs: 0.9 10*3/uL (ref 0.7–4.0)
MCH: 30.9 pg (ref 26.0–34.0)
MCHC: 31.8 g/dL (ref 30.0–36.0)
MCV: 97.2 fL (ref 80.0–100.0)
Monocytes Absolute: 0.4 10*3/uL (ref 0.1–1.0)
Monocytes Relative: 6 %
Neutro Abs: 4.6 10*3/uL (ref 1.7–7.7)
Neutrophils Relative %: 77 %
Platelet Count: 82 10*3/uL — ABNORMAL LOW (ref 150–400)
RBC: 3.59 MIL/uL — ABNORMAL LOW (ref 4.22–5.81)
RDW: 18.5 % — ABNORMAL HIGH (ref 11.5–15.5)
WBC Count: 6 10*3/uL (ref 4.0–10.5)
nRBC: 0 % (ref 0.0–0.2)

## 2021-10-09 LAB — CMP (CANCER CENTER ONLY)
ALT: 13 U/L (ref 0–44)
AST: 10 U/L — ABNORMAL LOW (ref 15–41)
Albumin: 3.5 g/dL (ref 3.5–5.0)
Alkaline Phosphatase: 46 U/L (ref 38–126)
Anion gap: 6 (ref 5–15)
BUN: 28 mg/dL — ABNORMAL HIGH (ref 8–23)
CO2: 28 mmol/L (ref 22–32)
Calcium: 8.8 mg/dL — ABNORMAL LOW (ref 8.9–10.3)
Chloride: 106 mmol/L (ref 98–111)
Creatinine: 1.04 mg/dL (ref 0.61–1.24)
GFR, Estimated: >60 mL/min (ref >60)
Glucose, Bld: 105 mg/dL — ABNORMAL HIGH (ref 70–99)
Potassium: 3.7 mmol/L (ref 3.5–5.1)
Sodium: 140 mmol/L (ref 135–145)
Total Bilirubin: 0.6 mg/dL (ref 0.3–1.2)
Total Protein: 4.9 g/dL — ABNORMAL LOW (ref 6.5–8.1)

## 2021-10-09 LAB — MAGNESIUM: Magnesium: 2.1 mg/dL (ref 1.7–2.4)

## 2021-10-09 MED ORDER — HEPARIN SOD (PORK) LOCK FLUSH 100 UNIT/ML IV SOLN
250.0000 [IU] | Freq: Once | INTRAVENOUS | Status: AC
  Administered 2021-10-09: 250 [IU]

## 2021-10-09 MED ORDER — SODIUM CHLORIDE 0.9% FLUSH
10.0000 mL | Freq: Once | INTRAVENOUS | Status: AC
  Administered 2021-10-09: 10 mL

## 2021-10-09 NOTE — Patient Instructions (Signed)

## 2021-10-13 ENCOUNTER — Other Ambulatory Visit: Payer: Self-pay | Admitting: *Deleted

## 2021-10-13 DIAGNOSIS — Z95828 Presence of other vascular implants and grafts: Secondary | ICD-10-CM

## 2021-10-13 DIAGNOSIS — C9500 Acute leukemia of unspecified cell type not having achieved remission: Secondary | ICD-10-CM

## 2021-10-13 DIAGNOSIS — D61818 Other pancytopenia: Secondary | ICD-10-CM

## 2021-10-13 DIAGNOSIS — C969 Malignant neoplasm of lymphoid, hematopoietic and related tissue, unspecified: Secondary | ICD-10-CM

## 2021-10-16 ENCOUNTER — Other Ambulatory Visit: Payer: Self-pay

## 2021-10-16 ENCOUNTER — Inpatient Hospital Stay: Payer: Medicare Other

## 2021-10-16 DIAGNOSIS — C9200 Acute myeloblastic leukemia, not having achieved remission: Secondary | ICD-10-CM | POA: Diagnosis not present

## 2021-10-16 DIAGNOSIS — Z95828 Presence of other vascular implants and grafts: Secondary | ICD-10-CM

## 2021-10-16 DIAGNOSIS — C9500 Acute leukemia of unspecified cell type not having achieved remission: Secondary | ICD-10-CM

## 2021-10-16 DIAGNOSIS — D61818 Other pancytopenia: Secondary | ICD-10-CM

## 2021-10-16 DIAGNOSIS — C969 Malignant neoplasm of lymphoid, hematopoietic and related tissue, unspecified: Secondary | ICD-10-CM

## 2021-10-16 LAB — CBC WITH DIFFERENTIAL (CANCER CENTER ONLY)
Abs Immature Granulocytes: 0.01 10*3/uL (ref 0.00–0.07)
Basophils Absolute: 0 10*3/uL (ref 0.0–0.1)
Basophils Relative: 1 %
Eosinophils Absolute: 0 10*3/uL (ref 0.0–0.5)
Eosinophils Relative: 0 %
HCT: 35.5 % — ABNORMAL LOW (ref 39.0–52.0)
Hemoglobin: 11.3 g/dL — ABNORMAL LOW (ref 13.0–17.0)
Immature Granulocytes: 1 %
Lymphocytes Relative: 36 %
Lymphs Abs: 0.8 10*3/uL (ref 0.7–4.0)
MCH: 31.1 pg (ref 26.0–34.0)
MCHC: 31.8 g/dL (ref 30.0–36.0)
MCV: 97.8 fL (ref 80.0–100.0)
Monocytes Absolute: 0.1 10*3/uL (ref 0.1–1.0)
Monocytes Relative: 6 %
Neutro Abs: 1.2 10*3/uL — ABNORMAL LOW (ref 1.7–7.7)
Neutrophils Relative %: 56 %
Platelet Count: 54 10*3/uL — ABNORMAL LOW (ref 150–400)
RBC: 3.63 MIL/uL — ABNORMAL LOW (ref 4.22–5.81)
RDW: 18.1 % — ABNORMAL HIGH (ref 11.5–15.5)
WBC Count: 2.1 10*3/uL — ABNORMAL LOW (ref 4.0–10.5)
nRBC: 0 % (ref 0.0–0.2)

## 2021-10-16 LAB — CMP (CANCER CENTER ONLY)
ALT: 11 U/L (ref 0–44)
AST: 10 U/L — ABNORMAL LOW (ref 15–41)
Albumin: 3.7 g/dL (ref 3.5–5.0)
Alkaline Phosphatase: 50 U/L (ref 38–126)
Anion gap: 9 (ref 5–15)
BUN: 20 mg/dL (ref 8–23)
CO2: 28 mmol/L (ref 22–32)
Calcium: 8.7 mg/dL — ABNORMAL LOW (ref 8.9–10.3)
Chloride: 107 mmol/L (ref 98–111)
Creatinine: 1.04 mg/dL (ref 0.61–1.24)
GFR, Estimated: >60 mL/min (ref >60)
Glucose, Bld: 143 mg/dL — ABNORMAL HIGH (ref 70–99)
Potassium: 3.4 mmol/L — ABNORMAL LOW (ref 3.5–5.1)
Sodium: 144 mmol/L (ref 135–145)
Total Bilirubin: 0.5 mg/dL (ref 0.3–1.2)
Total Protein: 5.2 g/dL — ABNORMAL LOW (ref 6.5–8.1)

## 2021-10-16 LAB — MAGNESIUM: Magnesium: 2.1 mg/dL (ref 1.7–2.4)

## 2021-10-16 MED ORDER — HEPARIN SOD (PORK) LOCK FLUSH 100 UNIT/ML IV SOLN
500.0000 [IU] | Freq: Once | INTRAVENOUS | Status: AC
  Administered 2021-10-16: 500 [IU] via INTRAVENOUS

## 2021-10-16 MED ORDER — SODIUM CHLORIDE 0.9% FLUSH
10.0000 mL | Freq: Once | INTRAVENOUS | Status: AC
  Administered 2021-10-16: 10 mL via INTRAVENOUS

## 2021-10-16 NOTE — Patient Instructions (Signed)

## 2021-10-20 ENCOUNTER — Other Ambulatory Visit: Payer: Self-pay | Admitting: *Deleted

## 2021-10-20 DIAGNOSIS — C969 Malignant neoplasm of lymphoid, hematopoietic and related tissue, unspecified: Secondary | ICD-10-CM

## 2021-10-20 DIAGNOSIS — C9500 Acute leukemia of unspecified cell type not having achieved remission: Secondary | ICD-10-CM

## 2021-10-20 DIAGNOSIS — Z95828 Presence of other vascular implants and grafts: Secondary | ICD-10-CM

## 2021-10-23 ENCOUNTER — Other Ambulatory Visit: Payer: Self-pay

## 2021-10-23 ENCOUNTER — Inpatient Hospital Stay: Payer: Medicare Other

## 2021-10-23 VITALS — BP 143/65 | HR 76 | Temp 98.0°F

## 2021-10-23 DIAGNOSIS — C9500 Acute leukemia of unspecified cell type not having achieved remission: Secondary | ICD-10-CM

## 2021-10-23 DIAGNOSIS — Z95828 Presence of other vascular implants and grafts: Secondary | ICD-10-CM

## 2021-10-23 DIAGNOSIS — C9200 Acute myeloblastic leukemia, not having achieved remission: Secondary | ICD-10-CM | POA: Diagnosis not present

## 2021-10-23 DIAGNOSIS — C969 Malignant neoplasm of lymphoid, hematopoietic and related tissue, unspecified: Secondary | ICD-10-CM

## 2021-10-23 LAB — CMP (CANCER CENTER ONLY)
ALT: 10 U/L (ref 0–44)
AST: 10 U/L — ABNORMAL LOW (ref 15–41)
Albumin: 3.8 g/dL (ref 3.5–5.0)
Alkaline Phosphatase: 58 U/L (ref 38–126)
Anion gap: 7 (ref 5–15)
BUN: 16 mg/dL (ref 8–23)
CO2: 29 mmol/L (ref 22–32)
Calcium: 8.6 mg/dL — ABNORMAL LOW (ref 8.9–10.3)
Chloride: 107 mmol/L (ref 98–111)
Creatinine: 1.09 mg/dL (ref 0.61–1.24)
GFR, Estimated: >60 mL/min (ref >60)
Glucose, Bld: 115 mg/dL — ABNORMAL HIGH (ref 70–99)
Potassium: 3.7 mmol/L (ref 3.5–5.1)
Sodium: 143 mmol/L (ref 135–145)
Total Bilirubin: 0.5 mg/dL (ref 0.3–1.2)
Total Protein: 5.5 g/dL — ABNORMAL LOW (ref 6.5–8.1)

## 2021-10-23 LAB — CBC WITH DIFFERENTIAL (CANCER CENTER ONLY)
Abs Immature Granulocytes: 0.02 10*3/uL (ref 0.00–0.07)
Basophils Absolute: 0 10*3/uL (ref 0.0–0.1)
Basophils Relative: 1 %
Eosinophils Absolute: 0 10*3/uL (ref 0.0–0.5)
Eosinophils Relative: 0 %
HCT: 36.2 % — ABNORMAL LOW (ref 39.0–52.0)
Hemoglobin: 11.3 g/dL — ABNORMAL LOW (ref 13.0–17.0)
Immature Granulocytes: 1 %
Lymphocytes Relative: 47 %
Lymphs Abs: 1 10*3/uL (ref 0.7–4.0)
MCH: 30.4 pg (ref 26.0–34.0)
MCHC: 31.2 g/dL (ref 30.0–36.0)
MCV: 97.3 fL (ref 80.0–100.0)
Monocytes Absolute: 0.2 10*3/uL (ref 0.1–1.0)
Monocytes Relative: 9 %
Neutro Abs: 0.9 10*3/uL — ABNORMAL LOW (ref 1.7–7.7)
Neutrophils Relative %: 42 %
Platelet Count: 75 10*3/uL — ABNORMAL LOW (ref 150–400)
RBC: 3.72 MIL/uL — ABNORMAL LOW (ref 4.22–5.81)
RDW: 17.9 % — ABNORMAL HIGH (ref 11.5–15.5)
WBC Count: 2 10*3/uL — ABNORMAL LOW (ref 4.0–10.5)
nRBC: 0 % (ref 0.0–0.2)

## 2021-10-23 LAB — MAGNESIUM: Magnesium: 2.2 mg/dL (ref 1.7–2.4)

## 2021-10-23 MED ORDER — HEPARIN SOD (PORK) LOCK FLUSH 100 UNIT/ML IV SOLN
500.0000 [IU] | Freq: Once | INTRAVENOUS | Status: AC
  Administered 2021-10-23: 10:00:00 500 [IU] via INTRAVENOUS

## 2021-10-23 MED ORDER — SODIUM CHLORIDE 0.9% FLUSH
10.0000 mL | Freq: Once | INTRAVENOUS | Status: AC
  Administered 2021-10-23: 10:00:00 10 mL via INTRAVENOUS

## 2021-10-23 NOTE — Patient Instructions (Signed)

## 2021-10-23 NOTE — Progress Notes (Signed)
Reviewed neutropenic precautions with patient including fever guidelines, good handwashing, avoiding crowds. Pt verbalized understanding and had no further questions.

## 2021-10-26 ENCOUNTER — Other Ambulatory Visit: Payer: Self-pay | Admitting: Family Medicine

## 2021-10-26 DIAGNOSIS — I6523 Occlusion and stenosis of bilateral carotid arteries: Secondary | ICD-10-CM

## 2021-10-26 NOTE — Telephone Encounter (Signed)
Chart supports rx refill Last ov: 06/06/2021 Last refill: 07/31/2021

## 2021-10-27 ENCOUNTER — Other Ambulatory Visit: Payer: Self-pay | Admitting: *Deleted

## 2021-10-27 DIAGNOSIS — C969 Malignant neoplasm of lymphoid, hematopoietic and related tissue, unspecified: Secondary | ICD-10-CM

## 2021-10-27 DIAGNOSIS — D61818 Other pancytopenia: Secondary | ICD-10-CM

## 2021-10-27 DIAGNOSIS — Z95828 Presence of other vascular implants and grafts: Secondary | ICD-10-CM

## 2021-10-27 DIAGNOSIS — C9500 Acute leukemia of unspecified cell type not having achieved remission: Secondary | ICD-10-CM

## 2021-10-31 ENCOUNTER — Other Ambulatory Visit: Payer: Self-pay

## 2021-10-31 ENCOUNTER — Other Ambulatory Visit: Payer: Self-pay | Admitting: *Deleted

## 2021-10-31 ENCOUNTER — Telehealth: Payer: Self-pay | Admitting: Family Medicine

## 2021-10-31 ENCOUNTER — Inpatient Hospital Stay: Payer: Medicare Other

## 2021-10-31 DIAGNOSIS — D61818 Other pancytopenia: Secondary | ICD-10-CM

## 2021-10-31 DIAGNOSIS — Z95828 Presence of other vascular implants and grafts: Secondary | ICD-10-CM

## 2021-10-31 DIAGNOSIS — C9500 Acute leukemia of unspecified cell type not having achieved remission: Secondary | ICD-10-CM

## 2021-10-31 DIAGNOSIS — C969 Malignant neoplasm of lymphoid, hematopoietic and related tissue, unspecified: Secondary | ICD-10-CM

## 2021-10-31 DIAGNOSIS — C9200 Acute myeloblastic leukemia, not having achieved remission: Secondary | ICD-10-CM | POA: Diagnosis not present

## 2021-10-31 LAB — CBC WITH DIFFERENTIAL (CANCER CENTER ONLY)
Abs Immature Granulocytes: 0.16 10*3/uL — ABNORMAL HIGH (ref 0.00–0.07)
Basophils Absolute: 0 10*3/uL (ref 0.0–0.1)
Basophils Relative: 1 %
Eosinophils Absolute: 0 10*3/uL (ref 0.0–0.5)
Eosinophils Relative: 0 %
HCT: 34.4 % — ABNORMAL LOW (ref 39.0–52.0)
Hemoglobin: 10.8 g/dL — ABNORMAL LOW (ref 13.0–17.0)
Immature Granulocytes: 5 %
Lymphocytes Relative: 33 %
Lymphs Abs: 1 10*3/uL (ref 0.7–4.0)
MCH: 30.3 pg (ref 26.0–34.0)
MCHC: 31.4 g/dL (ref 30.0–36.0)
MCV: 96.4 fL (ref 80.0–100.0)
Monocytes Absolute: 0.4 10*3/uL (ref 0.1–1.0)
Monocytes Relative: 13 %
Neutro Abs: 1.4 10*3/uL — ABNORMAL LOW (ref 1.7–7.7)
Neutrophils Relative %: 48 %
Platelet Count: 91 10*3/uL — ABNORMAL LOW (ref 150–400)
RBC: 3.57 MIL/uL — ABNORMAL LOW (ref 4.22–5.81)
RDW: 17.2 % — ABNORMAL HIGH (ref 11.5–15.5)
WBC Count: 3 10*3/uL — ABNORMAL LOW (ref 4.0–10.5)
nRBC: 1 % — ABNORMAL HIGH (ref 0.0–0.2)

## 2021-10-31 LAB — MAGNESIUM: Magnesium: 1.9 mg/dL (ref 1.7–2.4)

## 2021-10-31 LAB — CMP (CANCER CENTER ONLY)
ALT: 8 U/L (ref 0–44)
AST: 9 U/L — ABNORMAL LOW (ref 15–41)
Albumin: 3.8 g/dL (ref 3.5–5.0)
Alkaline Phosphatase: 69 U/L (ref 38–126)
Anion gap: 6 (ref 5–15)
BUN: 13 mg/dL (ref 8–23)
CO2: 30 mmol/L (ref 22–32)
Calcium: 9 mg/dL (ref 8.9–10.3)
Chloride: 106 mmol/L (ref 98–111)
Creatinine: 1.05 mg/dL (ref 0.61–1.24)
GFR, Estimated: >60 mL/min (ref >60)
Glucose, Bld: 126 mg/dL — ABNORMAL HIGH (ref 70–99)
Potassium: 3.5 mmol/L (ref 3.5–5.1)
Sodium: 142 mmol/L (ref 135–145)
Total Bilirubin: 0.4 mg/dL (ref 0.3–1.2)
Total Protein: 5.3 g/dL — ABNORMAL LOW (ref 6.5–8.1)

## 2021-10-31 MED ORDER — AMBULATORY NON FORMULARY MEDICATION: 2 refills | Status: AC

## 2021-10-31 MED ORDER — SODIUM CHLORIDE 0.9% FLUSH
10.0000 mL | Freq: Once | INTRAVENOUS | Status: AC
  Administered 2021-10-31: 10:00:00 10 mL via INTRAVENOUS

## 2021-10-31 MED ORDER — HEPARIN SOD (PORK) LOCK FLUSH 100 UNIT/ML IV SOLN
500.0000 [IU] | Freq: Once | INTRAVENOUS | Status: AC
  Administered 2021-10-31: 10:00:00 500 [IU] via INTRAVENOUS

## 2021-10-31 NOTE — Patient Instructions (Signed)

## 2021-10-31 NOTE — Telephone Encounter (Signed)
Pt's wife requested refill on [place] drink to :    Preferred Pharmacies     [facility] [address], [place] Phone:  [phone]  Fax:  [phone]    --glh

## 2021-11-02 ENCOUNTER — Telehealth: Payer: Self-pay | Admitting: Hematology & Oncology

## 2021-11-02 NOTE — Telephone Encounter (Signed)
Attempted to contact patient to schedule per Sch msg 12/29.Voicemail was left for patient to call [facility] office back to proceed scheduling.

## 2021-11-07 ENCOUNTER — Other Ambulatory Visit: Payer: Medicare Other

## 2021-11-13 ENCOUNTER — Encounter: Payer: Self-pay | Admitting: Hematology & Oncology

## 2021-11-13 ENCOUNTER — Inpatient Hospital Stay (HOSPITAL_BASED_OUTPATIENT_CLINIC_OR_DEPARTMENT_OTHER): Payer: Medicare Other | Admitting: Hematology & Oncology

## 2021-11-13 ENCOUNTER — Other Ambulatory Visit: Payer: Self-pay

## 2021-11-13 ENCOUNTER — Inpatient Hospital Stay: Payer: Medicare Other | Attending: Oncology

## 2021-11-13 ENCOUNTER — Inpatient Hospital Stay: Payer: Medicare Other

## 2021-11-13 VITALS — Wt 211.0 lb

## 2021-11-13 DIAGNOSIS — I2699 Other pulmonary embolism without acute cor pulmonale: Secondary | ICD-10-CM | POA: Diagnosis not present

## 2021-11-13 DIAGNOSIS — Z5111 Encounter for antineoplastic chemotherapy: Secondary | ICD-10-CM | POA: Insufficient documentation

## 2021-11-13 DIAGNOSIS — R21 Rash and other nonspecific skin eruption: Secondary | ICD-10-CM | POA: Diagnosis not present

## 2021-11-13 DIAGNOSIS — C9500 Acute leukemia of unspecified cell type not having achieved remission: Secondary | ICD-10-CM

## 2021-11-13 DIAGNOSIS — I82401 Acute embolism and thrombosis of unspecified deep veins of right lower extremity: Secondary | ICD-10-CM | POA: Diagnosis not present

## 2021-11-13 DIAGNOSIS — C9200 Acute myeloblastic leukemia, not having achieved remission: Secondary | ICD-10-CM | POA: Insufficient documentation

## 2021-11-13 DIAGNOSIS — Z79899 Other long term (current) drug therapy: Secondary | ICD-10-CM | POA: Insufficient documentation

## 2021-11-13 LAB — CBC WITH DIFFERENTIAL (CANCER CENTER ONLY)
Abs Immature Granulocytes: 0.06 10*3/uL (ref 0.00–0.07)
Basophils Absolute: 0 10*3/uL (ref 0.0–0.1)
Basophils Relative: 1 %
Eosinophils Absolute: 0 10*3/uL (ref 0.0–0.5)
Eosinophils Relative: 0 %
HCT: 37.1 % — ABNORMAL LOW (ref 39.0–52.0)
Hemoglobin: 11.7 g/dL — ABNORMAL LOW (ref 13.0–17.0)
Immature Granulocytes: 1 %
Lymphocytes Relative: 17 %
Lymphs Abs: 1 10*3/uL (ref 0.7–4.0)
MCH: 30 pg (ref 26.0–34.0)
MCHC: 31.5 g/dL (ref 30.0–36.0)
MCV: 95.1 fL (ref 80.0–100.0)
Monocytes Absolute: 0.4 10*3/uL (ref 0.1–1.0)
Monocytes Relative: 7 %
Neutro Abs: 4.3 10*3/uL (ref 1.7–7.7)
Neutrophils Relative %: 74 %
Platelet Count: 84 10*3/uL — ABNORMAL LOW (ref 150–400)
RBC: 3.9 MIL/uL — ABNORMAL LOW (ref 4.22–5.81)
RDW: 17.6 % — ABNORMAL HIGH (ref 11.5–15.5)
WBC Count: 5.8 10*3/uL (ref 4.0–10.5)
nRBC: 0 % (ref 0.0–0.2)

## 2021-11-13 LAB — CMP (CANCER CENTER ONLY)
ALT: 8 U/L (ref 0–44)
AST: 10 U/L — ABNORMAL LOW (ref 15–41)
Albumin: 4.1 g/dL (ref 3.5–5.0)
Alkaline Phosphatase: 77 U/L (ref 38–126)
Anion gap: 8 (ref 5–15)
BUN: 12 mg/dL (ref 8–23)
CO2: 28 mmol/L (ref 22–32)
Calcium: 8.7 mg/dL — ABNORMAL LOW (ref 8.9–10.3)
Chloride: 106 mmol/L (ref 98–111)
Creatinine: 1.1 mg/dL (ref 0.61–1.24)
GFR, Estimated: >60 mL/min (ref >60)
Glucose, Bld: 130 mg/dL — ABNORMAL HIGH (ref 70–99)
Potassium: 3.7 mmol/L (ref 3.5–5.1)
Sodium: 142 mmol/L (ref 135–145)
Total Bilirubin: 0.6 mg/dL (ref 0.3–1.2)
Total Protein: 5.4 g/dL — ABNORMAL LOW (ref 6.5–8.1)

## 2021-11-13 LAB — LACTATE DEHYDROGENASE: LDH: 166 U/L (ref 98–192)

## 2021-11-13 LAB — MAGNESIUM: Magnesium: 1.9 mg/dL (ref 1.7–2.4)

## 2021-11-13 LAB — SAVE SMEAR(SSMR), FOR PROVIDER SLIDE REVIEW

## 2021-11-13 LAB — URIC ACID: Uric Acid, Serum: 5.6 mg/dL (ref 3.7–8.6)

## 2021-11-13 MED ORDER — SODIUM CHLORIDE 0.9% FLUSH
10.0000 mL | INTRAVENOUS | Status: DC | PRN
  Administered 2021-11-13: 10 mL

## 2021-11-13 MED ORDER — SODIUM CHLORIDE 0.9 % IV SOLN
10.0000 mg | Freq: Once | INTRAVENOUS | Status: AC
  Administered 2021-11-13: 10 mg via INTRAVENOUS
  Filled 2021-11-13: qty 10

## 2021-11-13 MED ORDER — SODIUM CHLORIDE 0.9 % IV SOLN
49.5000 mg/m2 | Freq: Once | INTRAVENOUS | Status: AC
  Administered 2021-11-13: 100 mg via INTRAVENOUS
  Filled 2021-11-13: qty 10

## 2021-11-13 MED ORDER — SODIUM CHLORIDE 0.9 % IV SOLN: Freq: Once | INTRAVENOUS | Status: AC

## 2021-11-13 MED ORDER — HEPARIN SOD (PORK) LOCK FLUSH 100 UNIT/ML IV SOLN
500.0000 [IU] | Freq: Once | INTRAVENOUS | Status: AC | PRN
  Administered 2021-11-13: 500 [IU]

## 2021-11-13 MED ORDER — PALONOSETRON HCL INJECTION 0.25 MG/5ML
0.2500 mg | Freq: Once | INTRAVENOUS | Status: AC
  Administered 2021-11-13: 0.25 mg via INTRAVENOUS
  Filled 2021-11-13: qty 5

## 2021-11-13 NOTE — Progress Notes (Signed)
Hematology and Oncology Follow Up Visit  Victor [name] [id number] 1936-09-01 86 y.o. 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 twice daily --started on 03/16/2021     Interim History:  Victor [name] is back for follow-up.  As always, he is doing pretty well.  He comes in with his wife.  They did have a nice and quiet holiday season.  He is done incredibly well with treatment.  He has been in remission with treatment.  Whenever I look at his blood smear, I never see blasts.  He is followed jointly out at [facility] by Dr. Linus Orn.  Victor [name] typically sees Dr. Linus Orn the week before he starts his next cycle of treatment.  Dr. Linus Orn is very pleased with how well Victor [name] is doing.  There is been no change in his protocol.  He is complaining of some crampiness whenever he yawns.  This is in his throat muscles.  I told him maybe some magnesium could help this.  He has little bit of a skin rash.  I think he does see dermatology.  He has had no bleeding.  He has had no problems with the Eliquis.  He does have a past history of thromboembolic disease.  I really think that keeping him on low-dose Eliquis is not a bad idea.  His platelet count really has never gotten all that low.  He has had good appetite.  He has had no change in bowel or bladder habits.  He has had no nausea or vomiting.  Overall, I would say his performance status is ECOG 1.     Medications:  Current Outpatient Medications:    gabapentin (NEURONTIN) 300 MG capsule, Take 2 capsules (600 mg total) by mouth 3 (three) times daily., Disp: 240 capsule, Rfl: 4   acetaminophen (TYLENOL) 500 MG tablet, Take 1,000 mg by mouth every 6 (six) hours as needed for moderate pain., Disp: , Rfl:    AMBULATORY NON FORMULARY MEDICATION, Premier Protein Vanilla, Disp: 12  Bottle, Rfl: 2   apixaban (ELIQUIS) 2.5 MG TABS tablet, Take 1 tablet (2.5 mg total) by mouth 2 (two) times daily., Disp: 60 tablet, Rfl: 6   doxazosin (CARDURA) 2 MG tablet, Take 1 tablet (2 mg total) by mouth at bedtime., Disp: 90 tablet, Rfl: 3   HYDROcodone-acetaminophen (NORCO/VICODIN) 5-325 MG tablet, Take 1 tablet by mouth every 6 (six) hours as needed for moderate pain., Disp: , Rfl:    ipratropium (ATROVENT) 0.06 % nasal spray, Place into both nostrils 2 (two) times daily as needed., Disp: , Rfl:    ketoconazole (NIZORAL) 2 % shampoo, Apply 1 application topically 2 (two) times a week., Disp: , Rfl:    levothyroxine (SYNTHROID) 150 MCG tablet, Take 1 tablet (150 mcg total) by mouth daily., Disp: 90 tablet, Rfl: 3   Lidocaine 4 % PTCH, Apply 0.5 patches topically daily as needed (pain). Uses 1/2 patch daily., Disp: , Rfl:    lidocaine-prilocaine (EMLA) cream, Apply 1 application topically as needed for up to 30 doses. (Patient taking differently: Apply 1 application topically daily as needed (port access).), Disp: 30 g, Rfl: 0   Loratadine (CLARITIN PO), Take 10 mg by mouth at bedtime. Allertec, Disp: , Rfl:    Melatonin 5 MG CHEW, Chew 5 mg by mouth daily., Disp: , Rfl:    potassium chloride (MICRO-K) 10  MEQ CR capsule, Take 10 mEq by mouth in the morning and at bedtime., Disp: , Rfl:    propranolol ER (INDERAL LA) 80 MG 24 hr capsule, TAKE ONE CAPSULE BY MOUTH ONE TIME DAILY, Disp: 90 capsule, Rfl: 0   RESTASIS 0.05 % ophthalmic emulsion, Place 1 drop into both eyes 2 (two) times daily., Disp: , Rfl:    simvastatin (ZOCOR) 40 MG tablet, TAKE ONE TABLET BY MOUTH DAILY AT BEDTIME (Patient taking differently: Take 40 mg by mouth daily at 6 PM.), Disp: 90 tablet, Rfl: 3   venetoclax 100 MG TABS, Take 400 mg by mouth daily. Takes 400 mg daily for 2 weeks and stops for 4 weeks., Disp: , Rfl:   Allergies:  Allergies  Allergen Reactions   Augmentin [Amoxicillin-Pot Clavulanate] Diarrhea    SOB     Past Medical History, Surgical history, Social history, and Family History were reviewed and updated.  Review of Systems: Review of Systems  Constitutional: Negative.   HENT:  Negative.    Eyes: Negative.   Respiratory: Negative.    Cardiovascular: Negative.   Gastrointestinal: Negative.   Endocrine: Negative.   Genitourinary: Negative.    Musculoskeletal: Negative.   Skin: Negative.   Neurological: Negative.   Hematological: Negative.   Psychiatric/Behavioral: Negative.     Physical Exam:  weight is 211 lb (95.7 kg).   Wt Readings from Last 3 Encounters:  11/13/21 211 lb (95.7 kg)  11/13/21 211 lb (95.7 kg)  10/02/21 205 lb (93 kg)    Physical Exam Vitals reviewed.  HENT:     Head: Normocephalic and atraumatic.  Eyes:     Pupils: Pupils are equal, round, and reactive to light.  Cardiovascular:     Rate and Rhythm: Normal rate and regular rhythm.     Heart sounds: Normal heart sounds.  Pulmonary:     Effort: Pulmonary effort is normal.     Breath sounds: Normal breath sounds.  Abdominal:     General: Bowel sounds are normal.     Palpations: Abdomen is soft.  Musculoskeletal:        General: No tenderness or deformity. Normal range of motion.     Cervical back: Normal range of motion.  Lymphadenopathy:     Cervical: No cervical adenopathy.  Skin:    General: Skin is warm and dry.     Findings: No erythema or rash.  Neurological:     Mental Status: He is alert and oriented to person, place, and time.  Psychiatric:        Behavior: Behavior normal.        Thought Content: Thought content normal.        Judgment: Judgment normal.     Lab Results  Component Value Date   WBC 5.8 11/13/2021   HGB 11.7 (L) 11/13/2021   HCT 37.1 (L) 11/13/2021   MCV 95.1 11/13/2021   PLT 84 (L) 11/13/2021     Chemistry      Component Value Date/Time   NA 142 10/31/2021 0918   NA 142 03/30/2015 1149   K 3.5 10/31/2021 0918   K 4.5 03/30/2015 1149   CL 106  10/31/2021 0918   CO2 30 10/31/2021 0918   CO2 25 03/30/2015 1149   BUN 13 10/31/2021 0918   BUN 17.1 03/30/2015 1149   CREATININE 1.05 10/31/2021 0918   CREATININE 1.2 03/30/2015 1149      Component Value Date/Time   CALCIUM 9.0 10/31/2021 0918   CALCIUM 8.7 03/30/2015  2536   UYQIHKV 42 10/31/2021 0918   AST 9 (L) 10/31/2021 0918   ALT 8 10/31/2021 0918   BILITOT 0.4 10/31/2021 0918       Impression and Plan: Victor [name] is a very nice 86 year old white male who has acute myeloid leukemia.  He does have a adverse prognostic feature with respect to the FLT3 mutation.  I know there are targeted therapies for patients who have leukemia with this FLT3 mutation.  Again, Dr. Linus Orn is doing a fantastic job with him.  He is responding well to the Vidaza and the venetoclax.  There is no indication that we have to put him on one of the new targeted therapy.  He will start his 11th cycle of treatment.  He only gets the venetoclax only 7 days.  He gets a Vidaza for 5 days.  We will now make a change in how he have to monitor his blood counts.  I think we will have to do blood work twice between each cycle of therapy.  He is quite happy about this.  I really think that he is doing incredibly well.  He has an aggressive acute leukemia.  He has responded very nicely to therapy.  We will plan to get him back in another 6 weeks.  Volanda Napoleon, MD 1/9/20239:27 AM

## 2021-11-13 NOTE — Progress Notes (Signed)
Per Dr. [name], okay to treat today despite platelet count of 84

## 2021-11-13 NOTE — Patient Instructions (Signed)
[facility]  Discharge Instructions: Thank you for choosing [facility] to provide your oncology and hematology care.   If you have a lab appointment with the [facility], please go directly to the [facility] and check in at the registration area.  Wear comfortable clothing and clothing appropriate for easy access to any Portacath or PICC line.   We strive to give you quality time with your provider. You may need to reschedule your appointment if you arrive late (15 or more minutes).  Arriving late affects you and other patients whose appointments are after yours.  Also, if you miss three or more appointments without notifying the office, you may be dismissed from the clinic at the providers discretion.      For prescription refill requests, have your pharmacy contact our office and allow 72 hours for refills to be completed.    Today you received the following chemotherapy and/or immunotherapy agents velcade       To help prevent nausea and vomiting after your treatment, we encourage you to take your nausea medication as directed.  BELOW ARE SYMPTOMS THAT SHOULD BE REPORTED IMMEDIATELY: *FEVER GREATER THAN 100.4 F (38 C) OR HIGHER *CHILLS OR SWEATING *NAUSEA AND VOMITING THAT IS NOT CONTROLLED WITH YOUR NAUSEA MEDICATION *UNUSUAL SHORTNESS OF BREATH *UNUSUAL BRUISING OR BLEEDING *URINARY PROBLEMS (pain or burning when urinating, or frequent urination) *BOWEL PROBLEMS (unusual diarrhea, constipation, pain near the anus) TENDERNESS IN MOUTH AND THROAT WITH OR WITHOUT PRESENCE OF ULCERS (sore throat, sores in mouth, or a toothache) UNUSUAL RASH, SWELLING OR PAIN  UNUSUAL VAGINAL DISCHARGE OR ITCHING   Items with * indicate a potential emergency and should be followed up as soon as possible or go to the Emergency Department if any problems should occur.  Please show the CHEMOTHERAPY ALERT CARD or IMMUNOTHERAPY ALERT CARD at check-in to the  Emergency Department and triage nurse. Should you have questions after your visit or need to cancel or reschedule your appointment, please contact [facility]  [phone] and follow the prompts.  Office hours are 8:00 a.m. to 4:30 p.m. Monday - Friday. Please note that voicemails left after 4:00 p.m. may not be returned until the following business day.  We are closed weekends and major holidays. You have access to a nurse at all times for urgent questions. Please call the main number to the clinic [phone] and follow the prompts.  For any non-urgent questions, you may also contact your provider using MyChart. We now offer e-Visits for anyone 26 and older to request care online for non-urgent symptoms. For details visit [email or web address].   Also download the MyChart app! Go to the app store, search "MyChart", open the app, select [facility], and log in with your MyChart username and password.  Due to Covid, a mask is required upon entering the hospital/clinic. If you do not have a mask, one will be given to you upon arrival. For doctor visits, patients may have 1 support person aged 77 or older with them. For treatment visits, patients cannot have anyone with them due to current Covid guidelines and our immunocompromised population.

## 2021-11-14 ENCOUNTER — Inpatient Hospital Stay: Payer: Medicare Other

## 2021-11-14 VITALS — BP 165/62 | HR 87 | Temp 97.6°F | Resp 20

## 2021-11-14 DIAGNOSIS — C9500 Acute leukemia of unspecified cell type not having achieved remission: Secondary | ICD-10-CM

## 2021-11-14 DIAGNOSIS — C9200 Acute myeloblastic leukemia, not having achieved remission: Secondary | ICD-10-CM | POA: Diagnosis not present

## 2021-11-14 MED ORDER — SODIUM CHLORIDE 0.9 % IV SOLN
10.0000 mg | Freq: Once | INTRAVENOUS | Status: AC
  Administered 2021-11-14: 10 mg via INTRAVENOUS
  Filled 2021-11-14: qty 10

## 2021-11-14 MED ORDER — SODIUM CHLORIDE 0.9 % IV SOLN: Freq: Once | INTRAVENOUS | Status: AC

## 2021-11-14 MED ORDER — SODIUM CHLORIDE 0.9 % IV SOLN
49.5000 mg/m2 | Freq: Once | INTRAVENOUS | Status: AC
  Administered 2021-11-14: 100 mg via INTRAVENOUS
  Filled 2021-11-14: qty 10

## 2021-11-14 MED ORDER — SODIUM CHLORIDE 0.9% FLUSH
10.0000 mL | INTRAVENOUS | Status: DC | PRN
  Administered 2021-11-14: 10 mL

## 2021-11-14 MED ORDER — HEPARIN SOD (PORK) LOCK FLUSH 100 UNIT/ML IV SOLN
500.0000 [IU] | Freq: Once | INTRAVENOUS | Status: AC | PRN
  Administered 2021-11-14: 500 [IU]

## 2021-11-14 NOTE — Patient Instructions (Addendum)
[facility]  Discharge Instructions: Thank you for choosing [facility] to provide your oncology and hematology care.   If you have a lab appointment with the [facility], please go directly to the [facility] and check in at the registration area.  Wear comfortable clothing and clothing appropriate for easy access to any Portacath or PICC line.   We strive to give you quality time with your provider. You may need to reschedule your appointment if you arrive late (15 or more minutes).  Arriving late affects you and other patients whose appointments are after yours.  Also, if you miss three or more appointments without notifying the office, you may be dismissed from the clinic at the providers discretion.      For prescription refill requests, have your pharmacy contact our office and allow 72 hours for refills to be completed.    Today you received the following chemotherapy and/or immunotherapy agents Vidaza   To help prevent nausea and vomiting after your treatment, we encourage you to take your nausea medication as directed.  BELOW ARE SYMPTOMS THAT SHOULD BE REPORTED IMMEDIATELY: *FEVER GREATER THAN 100.4 F (38 C) OR HIGHER *CHILLS OR SWEATING *NAUSEA AND VOMITING THAT IS NOT CONTROLLED WITH YOUR NAUSEA MEDICATION *UNUSUAL SHORTNESS OF BREATH *UNUSUAL BRUISING OR BLEEDING *URINARY PROBLEMS (pain or burning when urinating, or frequent urination) *BOWEL PROBLEMS (unusual diarrhea, constipation, pain near the anus) TENDERNESS IN MOUTH AND THROAT WITH OR WITHOUT PRESENCE OF ULCERS (sore throat, sores in mouth, or a toothache) UNUSUAL RASH, SWELLING OR PAIN  UNUSUAL VAGINAL DISCHARGE OR ITCHING   Items with * indicate a potential emergency and should be followed up as soon as possible or go to the Emergency Department if any problems should occur.  Please show the CHEMOTHERAPY ALERT CARD or IMMUNOTHERAPY ALERT CARD at check-in to the  Emergency Department and triage nurse. Should you have questions after your visit or need to cancel or reschedule your appointment, please contact [facility]  [phone] and follow the prompts.  Office hours are 8:00 a.m. to 4:30 p.m. Monday - Friday. Please note that voicemails left after 4:00 p.m. may not be returned until the following business day.  We are closed weekends and major holidays. You have access to a nurse at all times for urgent questions. Please call the main number to the clinic [phone] and follow the prompts.  For any non-urgent questions, you may also contact your provider using MyChart. We now offer e-Visits for anyone 17 and older to request care online for non-urgent symptoms. For details visit [email or web address].   Also download the MyChart app! Go to the app store, search "MyChart", open the app, select [facility], and log in with your MyChart username and password.  Due to Covid, a mask is required upon entering the hospital/clinic. If you do not have a mask, one will be given to you upon arrival. For doctor visits, patients may have 1 support person aged 15 or older with them. For treatment visits, patients cannot have anyone with them due to current Covid guidelines and our immunocompromised population.

## 2021-11-15 ENCOUNTER — Inpatient Hospital Stay: Payer: Medicare Other

## 2021-11-15 ENCOUNTER — Other Ambulatory Visit: Payer: Self-pay

## 2021-11-15 VITALS — BP 139/51 | HR 72 | Temp 98.0°F | Resp 18

## 2021-11-15 DIAGNOSIS — C9200 Acute myeloblastic leukemia, not having achieved remission: Secondary | ICD-10-CM | POA: Diagnosis not present

## 2021-11-15 DIAGNOSIS — C9500 Acute leukemia of unspecified cell type not having achieved remission: Secondary | ICD-10-CM

## 2021-11-15 MED ORDER — SODIUM CHLORIDE 0.9% FLUSH
10.0000 mL | INTRAVENOUS | Status: DC | PRN
  Administered 2021-11-15: 10 mL

## 2021-11-15 MED ORDER — PALONOSETRON HCL INJECTION 0.25 MG/5ML
0.2500 mg | Freq: Once | INTRAVENOUS | Status: AC
  Administered 2021-11-15: 0.25 mg via INTRAVENOUS
  Filled 2021-11-15: qty 5

## 2021-11-15 MED ORDER — SODIUM CHLORIDE 0.9 % IV SOLN
49.0000 mg/m2 | Freq: Once | INTRAVENOUS | Status: AC
  Administered 2021-11-15: 100 mg via INTRAVENOUS
  Filled 2021-11-15: qty 10

## 2021-11-15 MED ORDER — HEPARIN SOD (PORK) LOCK FLUSH 100 UNIT/ML IV SOLN
500.0000 [IU] | Freq: Once | INTRAVENOUS | Status: AC | PRN
  Administered 2021-11-15: 500 [IU]

## 2021-11-15 MED ORDER — SODIUM CHLORIDE 0.9 % IV SOLN: Freq: Once | INTRAVENOUS | Status: AC

## 2021-11-15 MED ORDER — SODIUM CHLORIDE 0.9 % IV SOLN
10.0000 mg | Freq: Once | INTRAVENOUS | Status: AC
  Administered 2021-11-15: 10 mg via INTRAVENOUS
  Filled 2021-11-15: qty 10

## 2021-11-15 NOTE — Patient Instructions (Signed)
[facility]  Discharge Instructions: Thank you for choosing [facility] to provide your oncology and hematology care.   If you have a lab appointment with the [facility], please go directly to the [facility] and check in at the registration area.  Wear comfortable clothing and clothing appropriate for easy access to any Portacath or PICC line.   We strive to give you quality time with your provider. You may need to reschedule your appointment if you arrive late (15 or more minutes).  Arriving late affects you and other patients whose appointments are after yours.  Also, if you miss three or more appointments without notifying the office, you may be dismissed from the clinic at the providers discretion.      For prescription refill requests, have your pharmacy contact our office and allow 72 hours for refills to be completed.    Today you received the following chemotherapy and/or immunotherapy agents Vidaza       To help prevent nausea and vomiting after your treatment, we encourage you to take your nausea medication as directed.  BELOW ARE SYMPTOMS THAT SHOULD BE REPORTED IMMEDIATELY: *FEVER GREATER THAN 100.4 F (38 C) OR HIGHER *CHILLS OR SWEATING *NAUSEA AND VOMITING THAT IS NOT CONTROLLED WITH YOUR NAUSEA MEDICATION *UNUSUAL SHORTNESS OF BREATH *UNUSUAL BRUISING OR BLEEDING *URINARY PROBLEMS (pain or burning when urinating, or frequent urination) *BOWEL PROBLEMS (unusual diarrhea, constipation, pain near the anus) TENDERNESS IN MOUTH AND THROAT WITH OR WITHOUT PRESENCE OF ULCERS (sore throat, sores in mouth, or a toothache) UNUSUAL RASH, SWELLING OR PAIN  UNUSUAL VAGINAL DISCHARGE OR ITCHING   Items with * indicate a potential emergency and should be followed up as soon as possible or go to the Emergency Department if any problems should occur.  Please show the CHEMOTHERAPY ALERT CARD or IMMUNOTHERAPY ALERT CARD at check-in to the  Emergency Department and triage nurse. Should you have questions after your visit or need to cancel or reschedule your appointment, please contact [facility]  [phone] and follow the prompts.  Office hours are 8:00 a.m. to 4:30 p.m. Monday - Friday. Please note that voicemails left after 4:00 p.m. may not be returned until the following business day.  We are closed weekends and major holidays. You have access to a nurse at all times for urgent questions. Please call the main number to the clinic [phone] and follow the prompts.  For any non-urgent questions, you may also contact your provider using MyChart. We now offer e-Visits for anyone 46 and older to request care online for non-urgent symptoms. For details visit [email or web address].   Also download the MyChart app! Go to the app store, search "MyChart", open the app, select [facility], and log in with your MyChart username and password.  Due to Covid, a mask is required upon entering the hospital/clinic. If you do not have a mask, one will be given to you upon arrival. For doctor visits, patients may have 1 support person aged 23 or older with them. For treatment visits, patients cannot have anyone with them due to current Covid guidelines and our immunocompromised population.

## 2021-11-16 ENCOUNTER — Inpatient Hospital Stay: Payer: Medicare Other

## 2021-11-16 VITALS — BP 143/56 | HR 65 | Temp 97.9°F | Resp 17

## 2021-11-16 DIAGNOSIS — C9200 Acute myeloblastic leukemia, not having achieved remission: Secondary | ICD-10-CM | POA: Diagnosis not present

## 2021-11-16 DIAGNOSIS — C9500 Acute leukemia of unspecified cell type not having achieved remission: Secondary | ICD-10-CM

## 2021-11-16 MED ORDER — SODIUM CHLORIDE 0.9 % IV SOLN: Freq: Once | INTRAVENOUS | Status: AC

## 2021-11-16 MED ORDER — HEPARIN SOD (PORK) LOCK FLUSH 100 UNIT/ML IV SOLN
500.0000 [IU] | Freq: Once | INTRAVENOUS | Status: AC | PRN
  Administered 2021-11-16: 500 [IU]

## 2021-11-16 MED ORDER — SODIUM CHLORIDE 0.9% FLUSH
10.0000 mL | INTRAVENOUS | Status: DC | PRN
  Administered 2021-11-16: 10 mL

## 2021-11-16 MED ORDER — SODIUM CHLORIDE 0.9 % IV SOLN
10.0000 mg | Freq: Once | INTRAVENOUS | Status: AC
  Administered 2021-11-16: 10 mg via INTRAVENOUS
  Filled 2021-11-16: qty 10

## 2021-11-16 MED ORDER — SODIUM CHLORIDE 0.9 % IV SOLN
49.0000 mg/m2 | Freq: Once | INTRAVENOUS | Status: AC
  Administered 2021-11-16: 100 mg via INTRAVENOUS
  Filled 2021-11-16: qty 10

## 2021-11-16 NOTE — Patient Instructions (Signed)
[facility] [place]  Discharge Instructions: Thank you for choosing [facility] to provide your oncology and hematology care.   If you have a lab appointment with the [facility], please go directly to the [facility] and check in at the registration area.  Wear comfortable clothing and clothing appropriate for easy access to any Portacath or PICC line.   We strive to give you quality time with your provider. You may need to reschedule your appointment if you arrive late (15 or more minutes).  Arriving late affects you and other patients whose appointments are after yours.  Also, if you miss three or more appointments without notifying the office, you may be dismissed from the clinic at the providers discretion.      For prescription refill requests, have your pharmacy contact our office and allow 72 hours for refills to be completed.    Today you received the following chemotherapy and/or immunotherapy agents Vidaza       To help prevent nausea and vomiting after your treatment, we encourage you to take your nausea medication as directed.  BELOW ARE SYMPTOMS THAT SHOULD BE REPORTED IMMEDIATELY: *FEVER GREATER THAN 100.4 F (38 C) OR HIGHER *CHILLS OR SWEATING *NAUSEA AND VOMITING THAT IS NOT CONTROLLED WITH YOUR NAUSEA MEDICATION *UNUSUAL SHORTNESS OF BREATH *UNUSUAL BRUISING OR BLEEDING *URINARY PROBLEMS (pain or burning when urinating, or frequent urination) *BOWEL PROBLEMS (unusual diarrhea, constipation, pain near the anus) TENDERNESS IN MOUTH AND THROAT WITH OR WITHOUT PRESENCE OF ULCERS (sore throat, sores in mouth, or a toothache) UNUSUAL RASH, SWELLING OR PAIN  UNUSUAL VAGINAL DISCHARGE OR ITCHING   Items with * indicate a potential emergency and should be followed up as soon as possible or go to the Emergency Department if any problems should occur.  Please show the CHEMOTHERAPY ALERT CARD or IMMUNOTHERAPY ALERT CARD at check-in to the  Emergency Department and triage nurse. Should you have questions after your visit or need to cancel or reschedule your appointment, please contact [facility]  [phone] and follow the prompts.  Office hours are 8:00 a.m. to 4:30 p.m. Monday - Friday. Please note that voicemails left after 4:00 p.m. may not be returned until the following business day.  We are closed weekends and major holidays. You have access to a nurse at all times for urgent questions. Please call the main number to the clinic [phone] and follow the prompts.  For any non-urgent questions, you may also contact your provider using MyChart. We now offer e-Visits for anyone 94 and older to request care online for non-urgent symptoms. For details visit [email or web address].   Also download the MyChart app! Go to the app store, search "MyChart", open the app, select [facility], and log in with your MyChart username and password.  Due to Covid, a mask is required upon entering the hospital/clinic. If you do not have a mask, one will be given to you upon arrival. For doctor visits, patients may have 1 support person aged 61 or older with them. For treatment visits, patients cannot have anyone with them due to current Covid guidelines and our immunocompromised population.

## 2021-11-17 ENCOUNTER — Other Ambulatory Visit: Payer: Self-pay

## 2021-11-17 ENCOUNTER — Inpatient Hospital Stay: Payer: Medicare Other

## 2021-11-17 VITALS — BP 136/51 | HR 68 | Temp 98.7°F | Resp 16

## 2021-11-17 DIAGNOSIS — C9500 Acute leukemia of unspecified cell type not having achieved remission: Secondary | ICD-10-CM

## 2021-11-17 DIAGNOSIS — C9200 Acute myeloblastic leukemia, not having achieved remission: Secondary | ICD-10-CM | POA: Diagnosis not present

## 2021-11-17 MED ORDER — PALONOSETRON HCL INJECTION 0.25 MG/5ML
0.2500 mg | Freq: Once | INTRAVENOUS | Status: AC
  Administered 2021-11-17: 0.25 mg via INTRAVENOUS
  Filled 2021-11-17: qty 5

## 2021-11-17 MED ORDER — SODIUM CHLORIDE 0.9 % IV SOLN
10.0000 mg | Freq: Once | INTRAVENOUS | Status: AC
  Administered 2021-11-17: 10 mg via INTRAVENOUS
  Filled 2021-11-17: qty 10

## 2021-11-17 MED ORDER — SODIUM CHLORIDE 0.9 % IV SOLN: Freq: Once | INTRAVENOUS | Status: AC

## 2021-11-17 MED ORDER — HEPARIN SOD (PORK) LOCK FLUSH 100 UNIT/ML IV SOLN
500.0000 [IU] | Freq: Once | INTRAVENOUS | Status: AC | PRN
  Administered 2021-11-17: 500 [IU]

## 2021-11-17 MED ORDER — SODIUM CHLORIDE 0.9 % IV SOLN
49.5000 mg/m2 | Freq: Once | INTRAVENOUS | Status: AC
  Administered 2021-11-17: 100 mg via INTRAVENOUS
  Filled 2021-11-17: qty 10

## 2021-11-17 MED ORDER — SODIUM CHLORIDE 0.9% FLUSH
10.0000 mL | INTRAVENOUS | Status: DC | PRN
  Administered 2021-11-17: 10 mL

## 2021-11-17 NOTE — Patient Instructions (Signed)
[facility] [place]  Discharge Instructions: Thank you for choosing [facility] to provide your oncology and hematology care.   If you have a lab appointment with the [facility], please go directly to the [facility] and check in at the registration area.  Wear comfortable clothing and clothing appropriate for easy access to any Portacath or PICC line.   We strive to give you quality time with your provider. You may need to reschedule your appointment if you arrive late (15 or more minutes).  Arriving late affects you and other patients whose appointments are after yours.  Also, if you miss three or more appointments without notifying the office, you may be dismissed from the clinic at the providers discretion.      For prescription refill requests, have your pharmacy contact our office and allow 72 hours for refills to be completed.    Today you received the following chemotherapy and/or immunotherapy agents Vidaza       To help prevent nausea and vomiting after your treatment, we encourage you to take your nausea medication as directed.  BELOW ARE SYMPTOMS THAT SHOULD BE REPORTED IMMEDIATELY: *FEVER GREATER THAN 100.4 F (38 C) OR HIGHER *CHILLS OR SWEATING *NAUSEA AND VOMITING THAT IS NOT CONTROLLED WITH YOUR NAUSEA MEDICATION *UNUSUAL SHORTNESS OF BREATH *UNUSUAL BRUISING OR BLEEDING *URINARY PROBLEMS (pain or burning when urinating, or frequent urination) *BOWEL PROBLEMS (unusual diarrhea, constipation, pain near the anus) TENDERNESS IN MOUTH AND THROAT WITH OR WITHOUT PRESENCE OF ULCERS (sore throat, sores in mouth, or a toothache) UNUSUAL RASH, SWELLING OR PAIN  UNUSUAL VAGINAL DISCHARGE OR ITCHING   Items with * indicate a potential emergency and should be followed up as soon as possible or go to the Emergency Department if any problems should occur.  Please show the CHEMOTHERAPY ALERT CARD or IMMUNOTHERAPY ALERT CARD at check-in to the  Emergency Department and triage nurse. Should you have questions after your visit or need to cancel or reschedule your appointment, please contact [facility]  [phone] and follow the prompts.  Office hours are 8:00 a.m. to 4:30 p.m. Monday - Friday. Please note that voicemails left after 4:00 p.m. may not be returned until the following business day.  We are closed weekends and major holidays. You have access to a nurse at all times for urgent questions. Please call the main number to the clinic [phone] and follow the prompts.  For any non-urgent questions, you may also contact your provider using MyChart. We now offer e-Visits for anyone 87 and older to request care online for non-urgent symptoms. For details visit [email or web address].   Also download the MyChart app! Go to the app store, search "MyChart", open the app, select [facility], and log in with your MyChart username and password.  Due to Covid, a mask is required upon entering the hospital/clinic. If you do not have a mask, one will be given to you upon arrival. For doctor visits, patients may have 1 support person aged 67 or older with them. For treatment visits, patients cannot have anyone with them due to current Covid guidelines and our immunocompromised population.

## 2021-11-20 ENCOUNTER — Other Ambulatory Visit: Payer: Medicare Other

## 2021-11-20 ENCOUNTER — Inpatient Hospital Stay: Payer: Medicare Other

## 2021-11-21 ENCOUNTER — Other Ambulatory Visit: Payer: Self-pay | Admitting: Cardiology

## 2021-11-21 DIAGNOSIS — I10 Essential (primary) hypertension: Secondary | ICD-10-CM

## 2021-11-21 DIAGNOSIS — Z95828 Presence of other vascular implants and grafts: Secondary | ICD-10-CM

## 2021-11-22 ENCOUNTER — Inpatient Hospital Stay: Payer: Medicare Other

## 2021-11-22 ENCOUNTER — Other Ambulatory Visit: Payer: Self-pay

## 2021-11-22 VITALS — BP 135/63 | HR 74 | Temp 98.1°F | Resp 16

## 2021-11-22 DIAGNOSIS — C9200 Acute myeloblastic leukemia, not having achieved remission: Secondary | ICD-10-CM | POA: Diagnosis not present

## 2021-11-22 DIAGNOSIS — Z452 Encounter for adjustment and management of vascular access device: Secondary | ICD-10-CM

## 2021-11-22 MED ORDER — SODIUM CHLORIDE 0.9% FLUSH: 10.0000 mL | Freq: Once | INTRAVENOUS | Status: DC

## 2021-11-22 MED ORDER — HEPARIN SOD (PORK) LOCK FLUSH 100 UNIT/ML IV SOLN: 250.0000 [IU] | Freq: Once | INTRAVENOUS | Status: DC

## 2021-11-22 MED ORDER — HEPARIN SOD (PORK) LOCK FLUSH 100 UNIT/ML IV SOLN
500.0000 [IU] | Freq: Once | INTRAVENOUS | Status: AC
  Administered 2021-11-22: 500 [IU] via INTRAVENOUS

## 2021-11-22 MED ORDER — SODIUM CHLORIDE 0.9% FLUSH
10.0000 mL | Freq: Once | INTRAVENOUS | Status: AC
  Administered 2021-11-22: 10 mL via INTRAVENOUS

## 2021-11-22 NOTE — Patient Instructions (Signed)

## 2021-11-23 LAB — CBC
Hematocrit: 33.9 % — ABNORMAL LOW (ref 37.5–51.0)
Hemoglobin: 11.2 g/dL — ABNORMAL LOW (ref 13.0–17.7)
MCH: 29.8 pg (ref 26.6–33.0)
MCHC: 33 g/dL (ref 31.5–35.7)
MCV: 90 fL (ref 79–97)
Platelets: 77 10*3/uL — CL (ref 150–450)
RBC: 3.76 x10E6/uL — ABNORMAL LOW (ref 4.14–5.80)
RDW: 17.9 % — ABNORMAL HIGH (ref 11.6–15.4)
WBC: 3.7 10*3/uL (ref 3.4–10.8)

## 2021-11-23 NOTE — Progress Notes (Signed)
Labs are stable for IVC filter retrieval, chronic thrombocytopenia.

## 2021-11-24 ENCOUNTER — Encounter: Payer: Self-pay | Admitting: Oncology

## 2021-11-24 LAB — BASIC METABOLIC PANEL
BUN/Creatinine Ratio: 8 — ABNORMAL LOW (ref 10–24)
BUN: 8 mg/dL (ref 8–27)
CO2: 26 mmol/L (ref 20–29)
Calcium: 8 mg/dL — ABNORMAL LOW (ref 8.6–10.2)
Chloride: 104 mmol/L (ref 96–106)
Creatinine, Ser: 1.02 mg/dL (ref 0.76–1.27)
Glucose: 115 mg/dL — ABNORMAL HIGH (ref 70–99)
Potassium: 4.1 mmol/L (ref 3.5–5.2)
Sodium: 140 mmol/L (ref 134–144)
eGFR: 73 mL/min/{1.73_m2} (ref >59)

## 2021-11-24 IMAGING — US US EXTREM LOW VENOUS
1 series · 13 of 24 positions shown · non-contrast
Comparison: None.

CLINICAL DATA: Known pulmonary emboli



[Series 1: us extrem low venous · 13 of 76 slices shown]
[im 1/76]
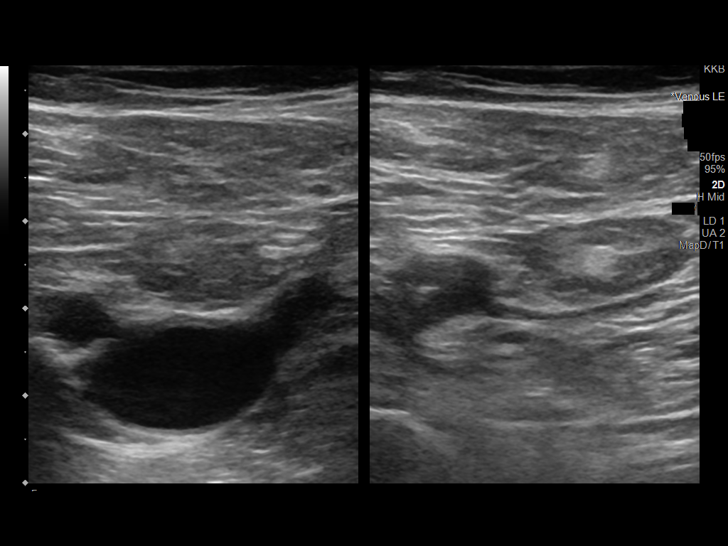
[im 7/76]
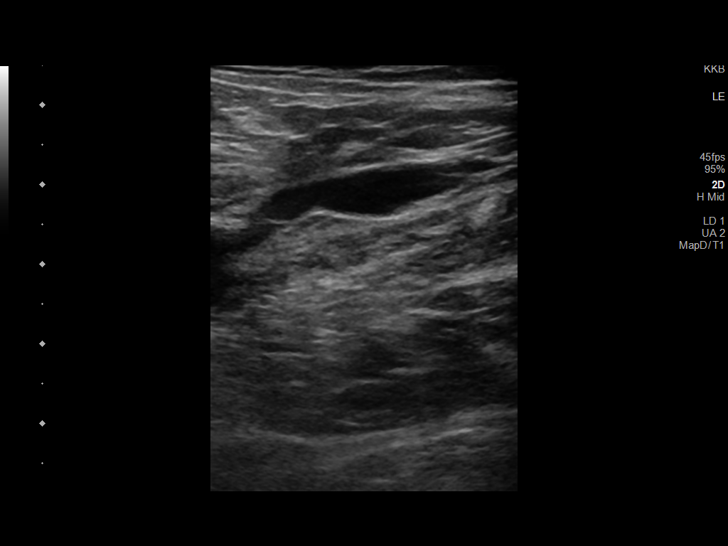
[im 14/76]
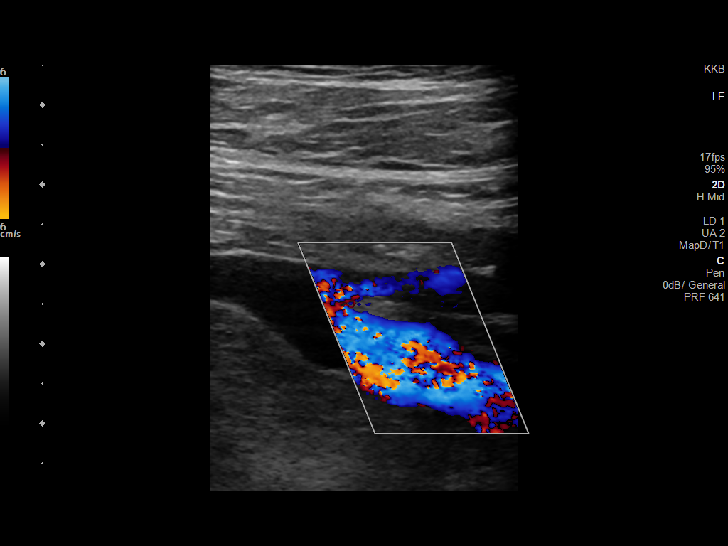
[im 20/76]
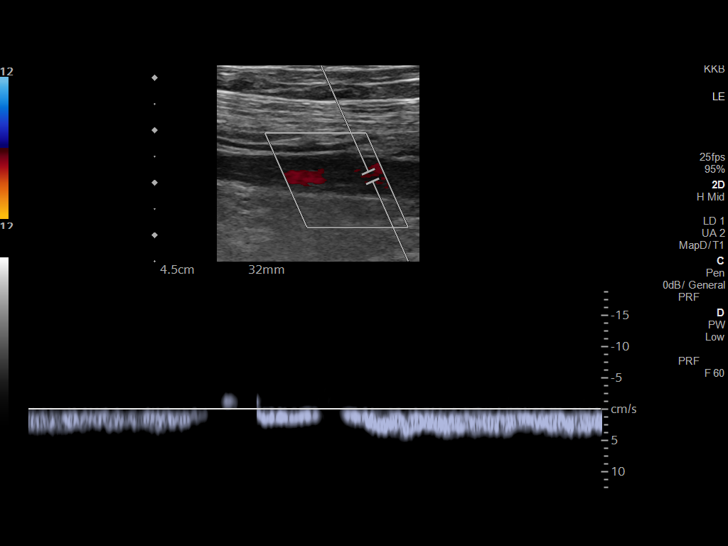
[im 27/76]
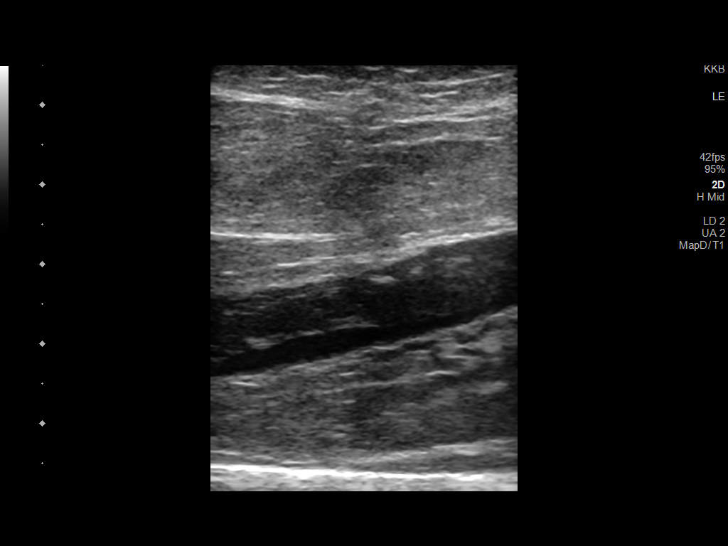
[im 33/76]
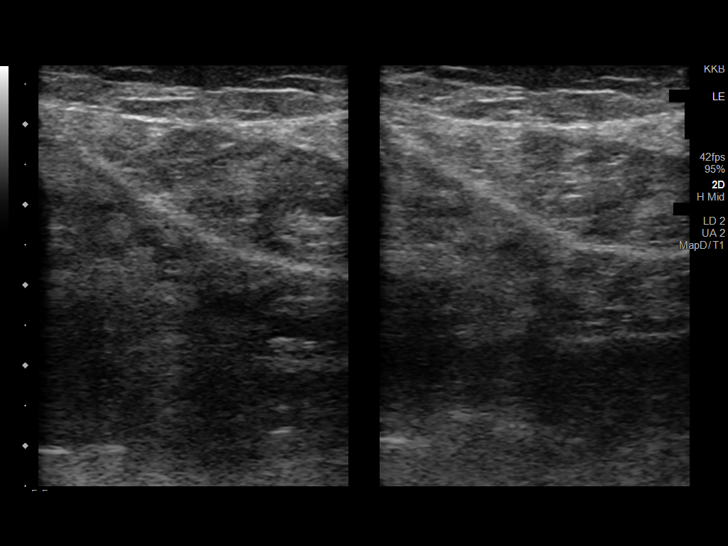
[im 40/76]
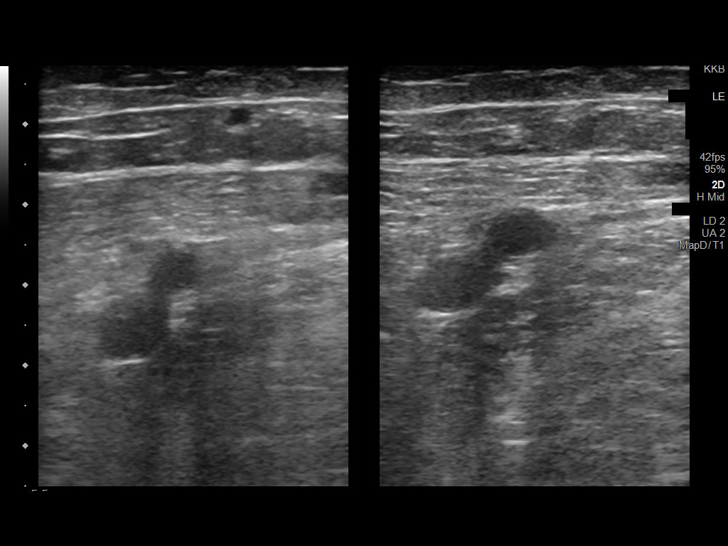
[im 43/76]
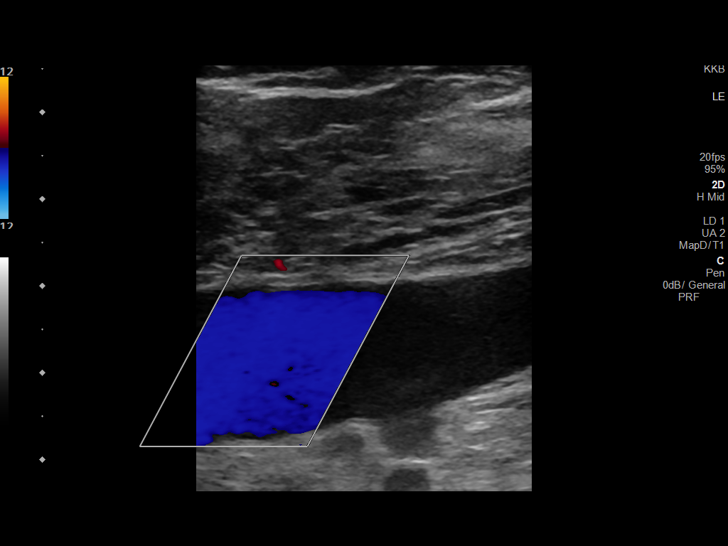
[im 49/76]
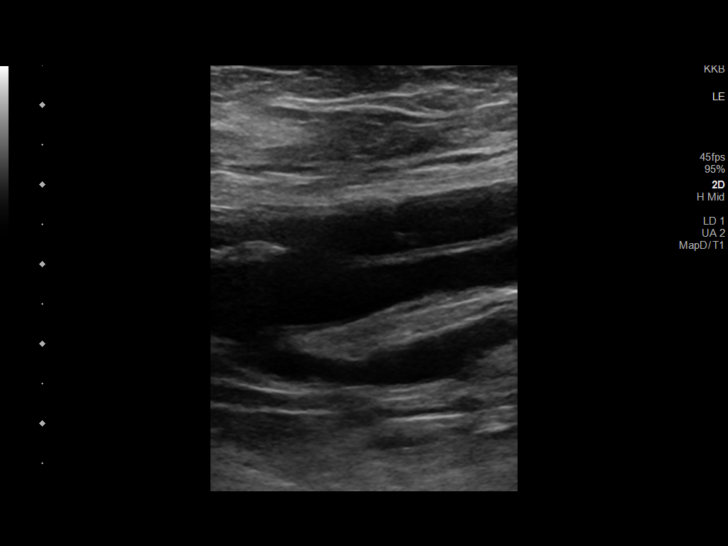
[im 56/76]
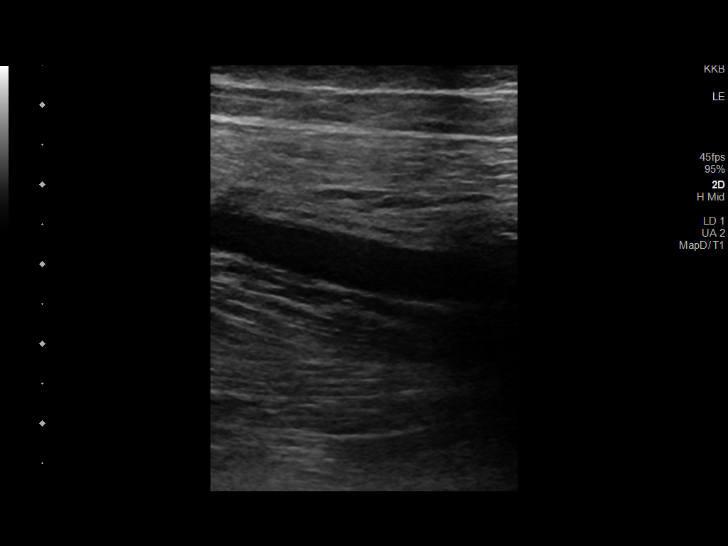
[im 62/76]
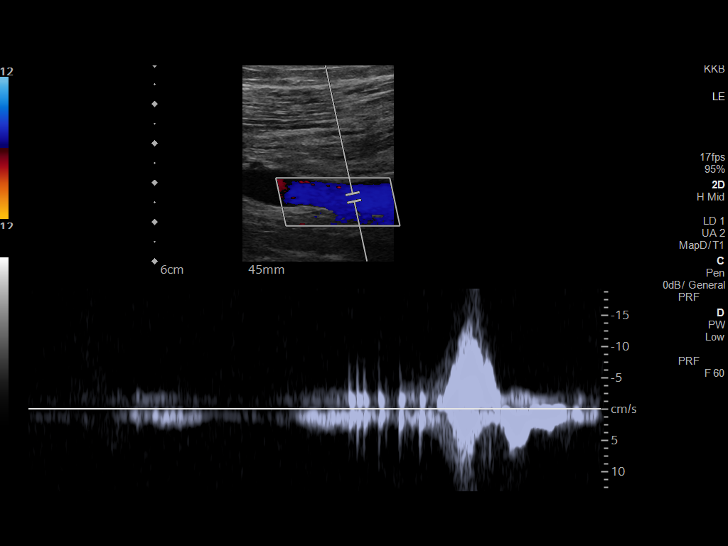
[im 69/76]
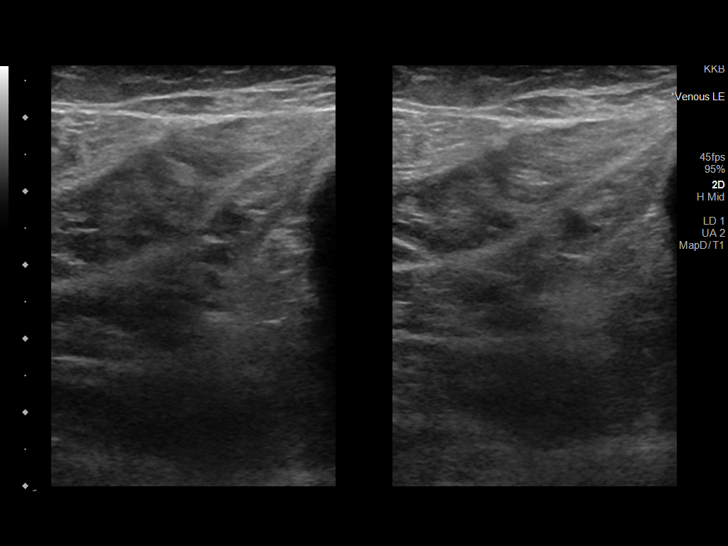
[im 76/76]
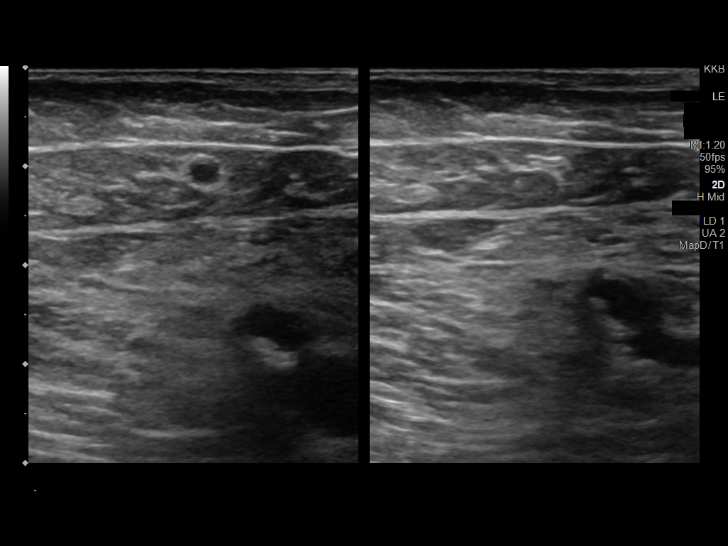

[13 of 24 positions shown; findings below may reference images not displayed]

FINDINGS: RIGHT LOWER EXTREMITY

Common Femoral Vein: No evidence of thrombus. Normal
compressibility, respiratory phasicity and response to augmentation.

Saphenofemoral Junction: No evidence of thrombus. Normal
compressibility and flow on color Doppler imaging.

Profunda Femoral Vein: No evidence of thrombus. Normal
compressibility and flow on color Doppler imaging.

Femoral Vein: Thrombus is noted with decreased compressibility.

Popliteal Vein: Thrombus is noted with decreased compressibility.

Calf Veins: No evidence of thrombus. Normal compressibility and flow
on color Doppler imaging.

Superficial Great Saphenous Vein: No evidence of thrombus. Normal
compressibility.

Venous Reflux:  None.

Other Findings:  None.

LEFT LOWER EXTREMITY

Common Femoral Vein: No evidence of thrombus. Normal
compressibility, respiratory phasicity and response to augmentation.

Saphenofemoral Junction: No evidence of thrombus. Normal
compressibility and flow on color Doppler imaging.

Profunda Femoral Vein: No evidence of thrombus. Normal
compressibility and flow on color Doppler imaging.

Femoral Vein: No evidence of thrombus. Normal compressibility,
respiratory phasicity and response to augmentation.

Popliteal Vein: No evidence of thrombus. Normal compressibility,
respiratory phasicity and response to augmentation.

Calf Veins: No evidence of thrombus. Normal compressibility and flow
on color Doppler imaging.

Superficial Great Saphenous Vein: No evidence of thrombus. Normal
compressibility.

Venous Reflux:  None.

Other Findings:  None.
IMPRESSION: Right popliteal and femoral vein deep venous thrombosis.

## 2021-11-24 IMAGING — CT CT RENAL STONE PROTOCOL
2 of 6 series · 16 of 46 positions shown, 18 images · non-contrast
Comparison: Renal ultrasound 08/05/2020

CLINICAL DATA: Hematuria.

EXAM:
CT ABDOMEN AND PELVIS WITHOUT CONTRAST
TECHNIQUE: Multidetector CT imaging of the abdomen and pelvis was performed
following the standard protocol without IV contrast.

[Series 5: axial st · axial · 0.87mm/px · z∈[-652,-212]mm · 13 of 98 slices shown, 15 images]
[im 5/98  soft-tissue]
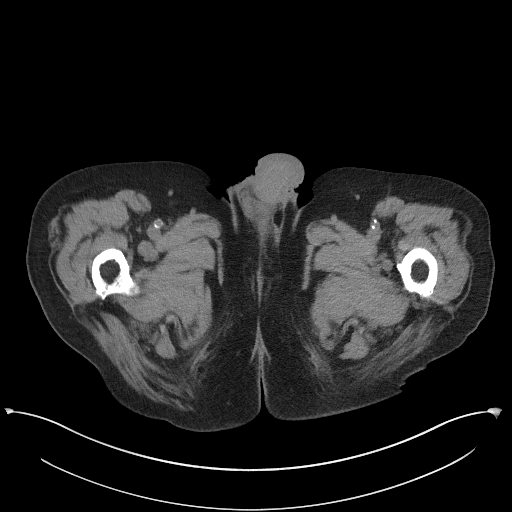
[im 5/98  bone]
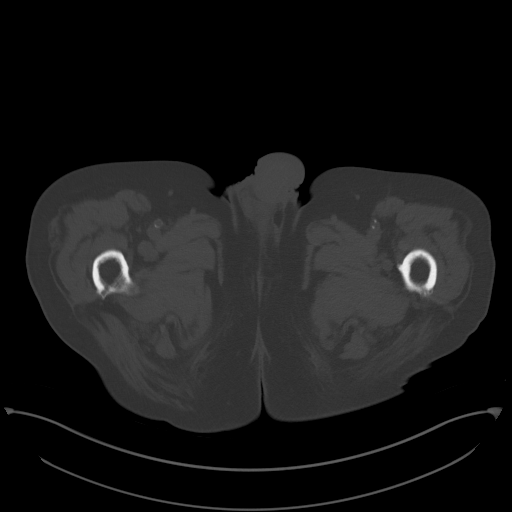
[im 13/98  soft-tissue]
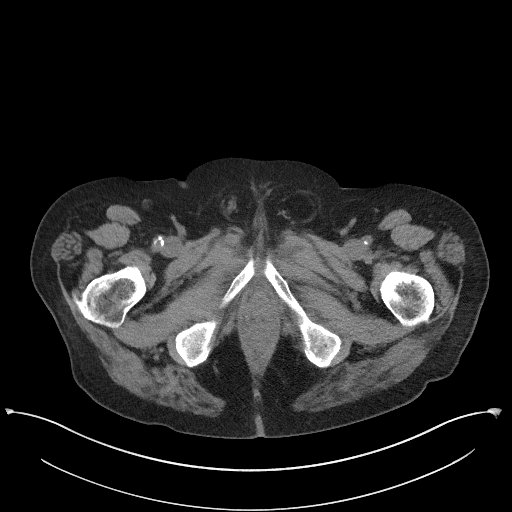
[im 21/98  soft-tissue]
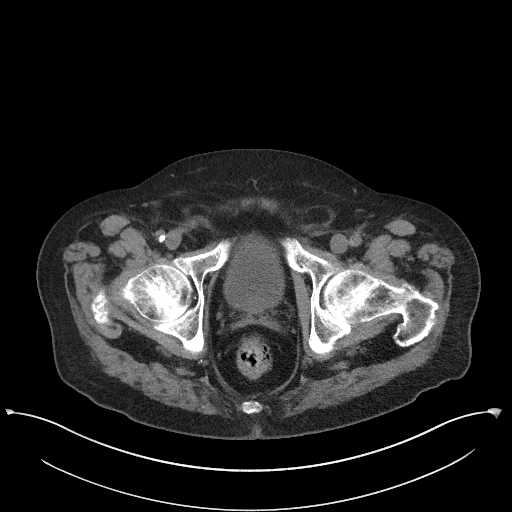
[im 29/98  soft-tissue]
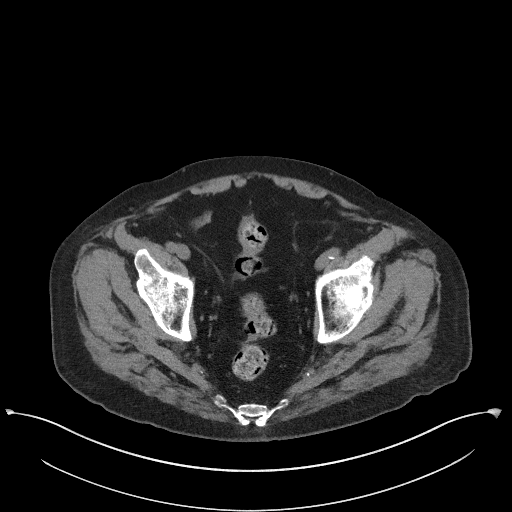
[im 33/98  soft-tissue]
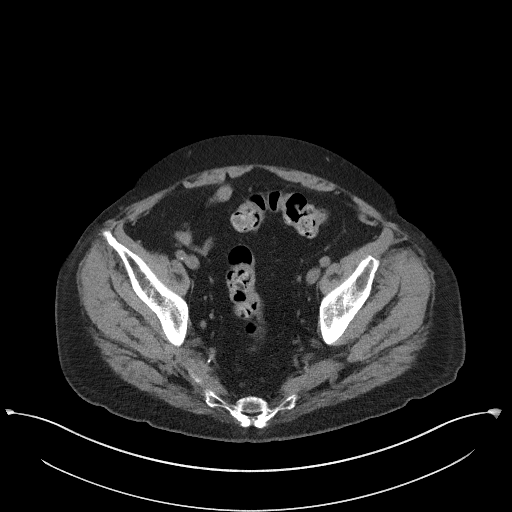
[im 41/98  soft-tissue]
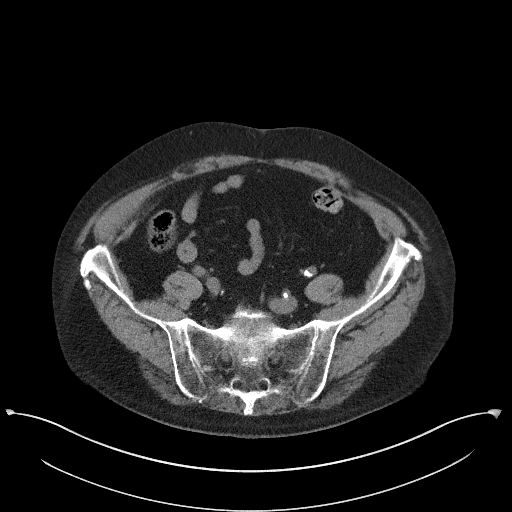
[im 49/98  soft-tissue]
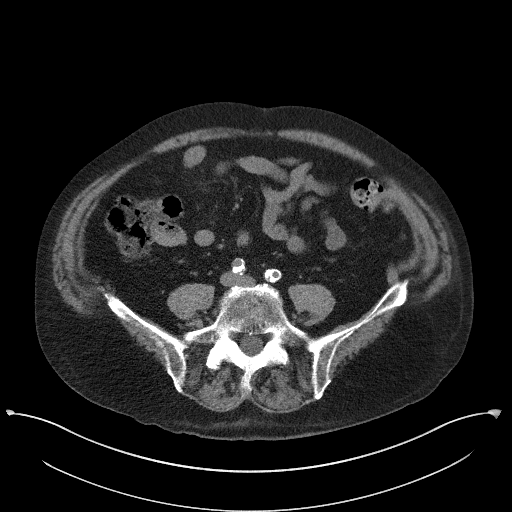
[im 57/98  soft-tissue]
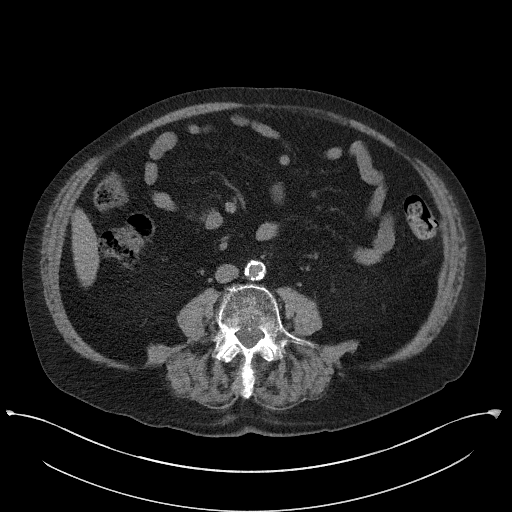
[im 65/98  soft-tissue]
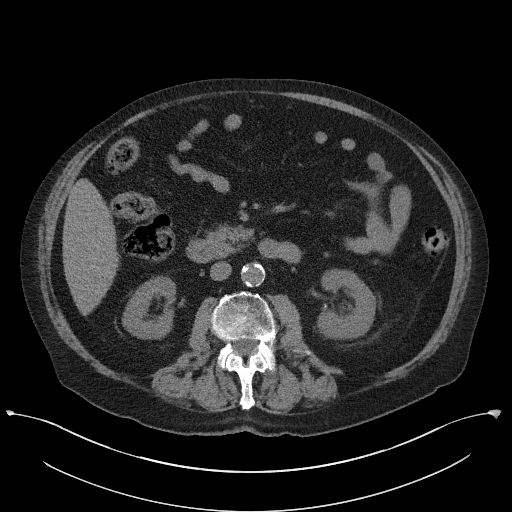
[im 65/98  bone]
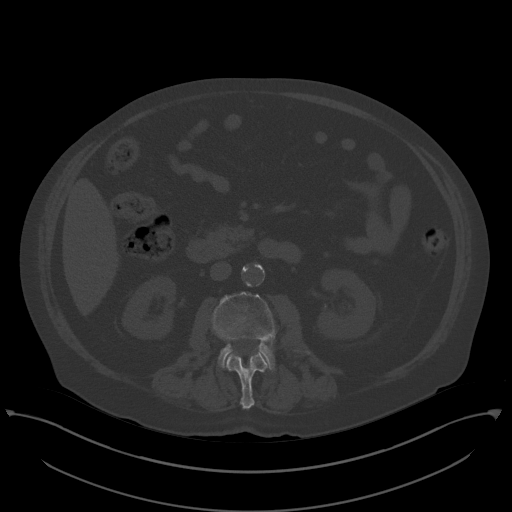
[im 69/98  soft-tissue]
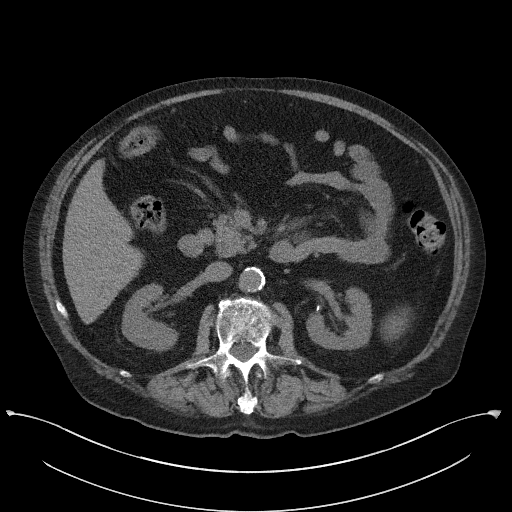
[im 77/98  soft-tissue]
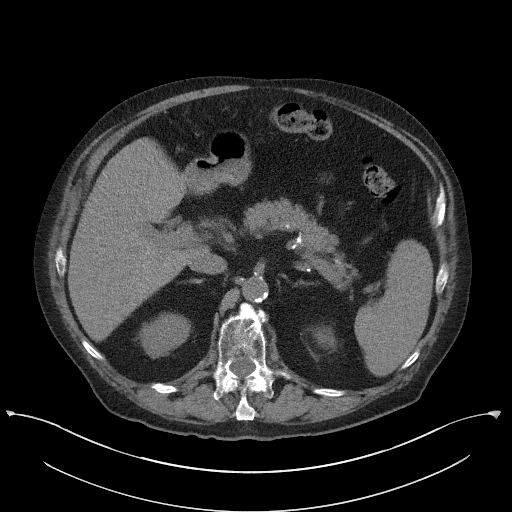
[im 85/98  soft-tissue]
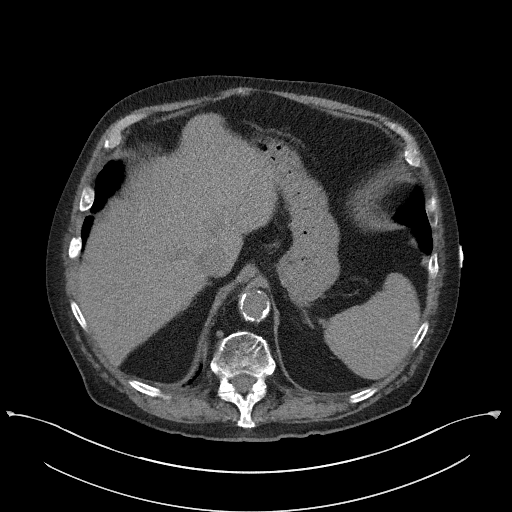
[im 93/98  soft-tissue]
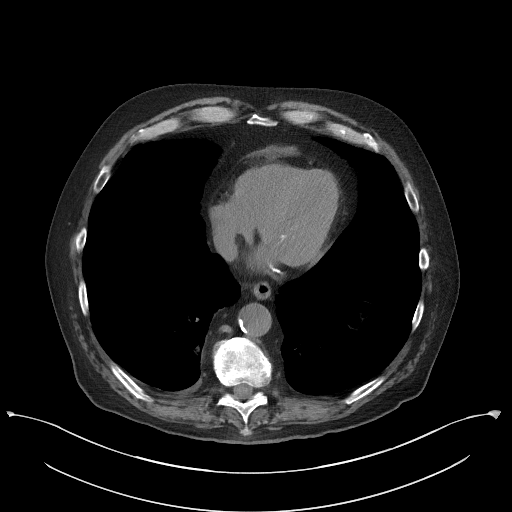

[Series 8: coronal st · coronal · 0.86mm/px · 3 of 101 slices shown]
[im 34/101  soft-tissue]
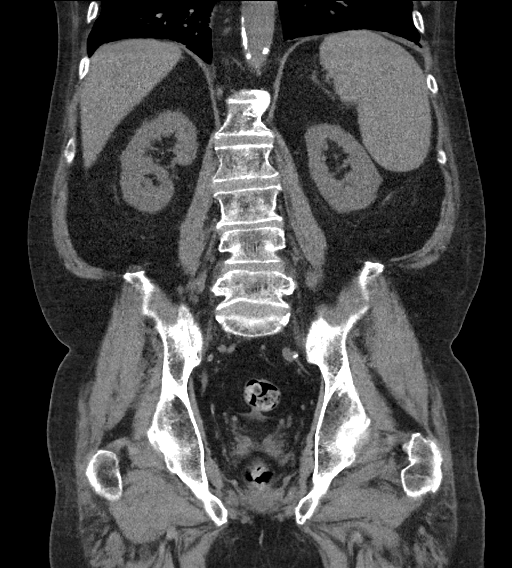
[im 45/101  soft-tissue]
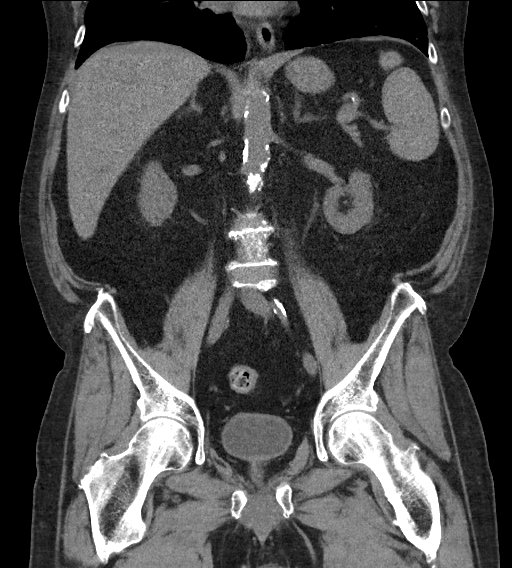
[im 56/101  soft-tissue]
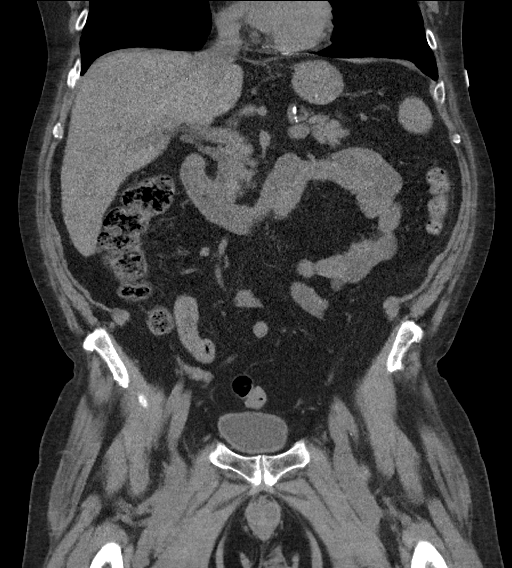

[16 of 46 positions shown; findings below may reference images not displayed]

FINDINGS: Lower chest: Assessed on concurrent chest CT, reported separately.

Hepatobiliary: No focal hepatic abnormality on noncontrast exam.
Decompressed gallbladder. No calcified gallstone or pericholecystic
inflammation.

Pancreas: No ductal dilatation or inflammation.

Spleen: Mild splenomegaly, spleen measuring 12.4 x 13.1 x 6.5 cm
(volume = 550 cm^3). No focal abnormality.

Adrenals/Urinary Tract: Normal adrenal glands. No hydronephrosis or
renal calculi. Calcifications at the renal hila are felt to be
vascular. Minimal symmetric perinephric edema typically chronic. No
evidence of focal renal lesion on this noncontrast exam. Partially
distended urinary bladder. No bladder wall thickening or stone.

Stomach/Bowel: Decompressed stomach. No small bowel obstruction or
inflammation. Scattered colonic diverticulosis without
diverticulitis. Appendectomy.

Vascular/Lymphatic: Moderate aortic atherosclerosis. Bi-iliac stents
in place. No bulky abdominopelvic adenopathy.

Reproductive: Prostate is unremarkable.

Other: No ascites or free air. Fat containing inguinal hernias, left
greater than right.

Musculoskeletal: No acute osseous abnormality or focal bone lesion.
IMPRESSION: 1. No renal stones or obstructive uropathy. Calcifications at the
renal hila are felt to be vascular.
2. Colonic diverticulosis without diverticulitis.
3. Mild splenomegaly.

Aortic Atherosclerosis ([id number]).

## 2021-11-27 ENCOUNTER — Other Ambulatory Visit: Payer: Medicare Other

## 2021-11-27 NOTE — Progress Notes (Signed)
Primary Physician/Referring:  [name], MD  Patient ID: Victor [name], male    DOB: [birth year], 86 y.o.   MRN: [id number]  IVC filter retreival (removal of Bard IVC filter)  HPI:    SOHAIL CAPRARO  is a 86 y.o. 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    He was admitted to the hospital with chest pain and was found to have subsegmental pulmonary embolism with right leg DVT and was started on Eliquis and underwent IVC placement on 03/17/2021 with Bard Denali filter.  He is presently tolerating anticoagulation well without bleeding diathesis.  He has mild right leg edema.  He has not had any further chest pain.  He denies any dyspnea, PND or orthopnea.  He was seen by me about 2 to 3 months ago, we had extensive discussions regarding IVC filter retrieval for prevention of long-term complications including perforation.  Patient now presents for the procedure.  I have also discussed with oncology/hematology as well who are in favor of retrieval if possible.  There is no further indication for filter, as he is presently tolerating anticoagulation for his prior DVT and PE.  He continues to have low platelet count.  Past Medical History:  Diagnosis Date   Arthritis    Atherosclerosis    Atherosclerotic PVD with intermittent claudication (HCC)    stent left leg   BPH (benign prostatic hyperplasia)    Bulging lumbar disc    Cancer ([place]) 11/12/14   vocal cord  carcinoma in situ , radiation; thyroid cancer   Carotid bruit    COPD (chronic obstructive pulmonary disease) (HCC)    DDD (degenerative disc disease), lumbar    Dysphonia    Dysplasia of true vocal cord    GERD  (gastroesophageal reflux disease)    H/O carotid atherosclerosis    b/l   Hyperlipidemia    Hypothyroidism    Occasional tremors    left hand managed with propranolol   Peripheral vascular angioplasty status with implants and grafts    Precancerous lesion 03/05/2018   premelanoma removed from back.    Radiation 03/10/15- 04/18/16   Larynx   Sleep apnea    hasn't used CPAP in years   Spondylosis of lumbosacral region    Thyroid disease    Past Surgical History:  Procedure Laterality Date   APPENDECTOMY     COLONOSCOPY W/ POLYPECTOMY     DIODE LASER APPLICATION Left 09/32/3557   Procedure: DIODE LASER APPLICATION;  Surgeon: Hayden Pedro, MD;  Location: [facility];  Service: Ophthalmology;  Laterality: Left;   IR IMAGING GUIDED PORT INSERTION  01/16/2021   IR IVC FILTER PLMT / S&I /IMG GUID/MOD SED  03/17/2021   MEMBRANE PEEL Left 08/18/2019   Procedure: MEMBRANE PEEL;  Surgeon: Hayden Pedro, MD;  Location: [facility];  Service: Ophthalmology;  Laterality: Left;   MOUTH SURGERY     tooth extraction   PARS PLANA VITRECTOMY Left 08/18/2019   PARS PLANA VITRECTOMY 27 GAUGE (Left)   PARS PLANA VITRECTOMY 27 GAUGE Left 08/18/2019   Procedure: PARS PLANA VITRECTOMY 27 GAUGE;  Surgeon: Hayden Pedro, MD;  Location: [facility];  Service: Ophthalmology;  Laterality: Left;   PHOTOCOAGULATION WITH LASER Left 08/18/2019   Procedure: PHOTOCOAGULATION WITH  LASER;  Surgeon: Hayden Pedro, MD;  Location: [facility];  Service: Ophthalmology;  Laterality: Left;   THYROID SURGERY     TONSILLECTOMY     vocal cord biopsy  11/12/14   squamous cell carcinoma in situ   Family History  Problem Relation Age of Onset   Colon cancer Father    Cancer Sister        Died of metastatic cancer   Leukemia Neg Hx     Social History   Tobacco Use   Smoking status: Former    Packs/day: 2.00    Years: 51.00    Pack years: 102.00    Types: Cigarettes    Start date: 59    Quit date: 03/05/2004    Years since  quitting: 17.7   Smokeless tobacco: Never  Substance Use Topics   Alcohol use: Yes    Alcohol/week: 3.0 standard drinks    Types: 3 Glasses of wine per week    Comment: occ   Marital Status: Married  ROS  Review of Systems  Cardiovascular:  Positive for leg swelling. Negative for chest pain and dyspnea on exertion.  Musculoskeletal:  Positive for arthritis. Negative for muscle cramps.  Gastrointestinal:  Negative for melena.  Objective  Blood pressure (!) 136/58, pulse 85, temperature 98.7 F (37.1 C), temperature source Oral, resp. rate 16, height 5\' 10"  (1.778 m), weight 95.3 kg, SpO2 98 %.  Vitals with BMI 11/28/2021 11/22/2021 11/17/2021  Height 5\' 10"  - -  Weight 210 lbs - -  BMI 93.81 - -  Systolic 829 937 169  Diastolic 58 63 51  Pulse 85 74 68     Physical Exam Neck:     Vascular: No JVD.  Cardiovascular:     Rate and Rhythm: Normal rate and regular rhythm.     Pulses: Intact distal pulses.          Carotid pulses are 2+ on the right side and 2+ on the left side.      Femoral pulses are 2+ on the right side and 1+ on the left side.      Popliteal pulses are 2+ on the right side and 1+ on the left side.       Dorsalis pedis pulses are 2+ on the right side and 1+ on the left side.       Posterior tibial pulses are 1+ on the right side and 0 on the left side.     Heart sounds: Normal heart sounds. No murmur heard.   No gallop.  Pulmonary:     Effort: Pulmonary effort is normal.     Breath sounds: Normal breath sounds.  Abdominal:     General: Bowel sounds are normal.     Palpations: Abdomen is soft.  Musculoskeletal:     Right lower leg: Edema (1-2 +) present.   Laboratory examination:   Recent Labs    10/23/21 0925 10/31/21 0918 11/13/21 0914 11/22/21 1117  NA 143 142 142 140  K 3.7 3.5 3.7 4.1  CL 107 106 106 104  CO2 29 30 28 26   GLUCOSE 115* 126* 130* 115*  BUN 16 13 12 8   CREATININE 1.09 1.05 1.10 1.02  CALCIUM 8.6* 9.0 8.7* 8.0*  GFRNONAA >60 >60  >60  --    estimated creatinine clearance is 63.6 mL/min (by C-G formula based on SCr of 1.02 mg/dL).  CMP Latest Ref Rng & Units 11/22/2021 11/13/2021 10/31/2021  Glucose 70 - 99 mg/dL 115(H) 130(H)  126(H)  BUN 8 - 27 mg/dL 8 12 13   Creatinine 0.76 - 1.27 mg/dL 1.02 1.10 1.05  Sodium 134 - 144 mmol/L 140 142 142  Potassium 3.5 - 5.2 mmol/L 4.1 3.7 3.5  Chloride 96 - 106 mmol/L 104 106 106  CO2 20 - 29 mmol/L 26 28 30   Calcium 8.6 - 10.2 mg/dL 8.0(L) 8.7(L) 9.0  Total Protein 6.5 - 8.1 g/dL - 5.4(L) 5.3(L)  Total Bilirubin 0.3 - 1.2 mg/dL - 0.6 0.4  Alkaline Phos 38 - 126 U/L - 77 69  AST 15 - 41 U/L - 10(L) 9(L)  ALT 0 - 44 U/L - 8 8   CBC Latest Ref Rng & Units 11/22/2021 11/13/2021 10/31/2021  WBC 3.4 - 10.8 x10E3/uL 3.7 5.8 3.0(L)  Hemoglobin 13.0 - 17.7 g/dL 11.2(L) 11.7(L) 10.8(L)  Hematocrit 37.5 - 51.0 % 33.9(L) 37.1(L) 34.4(L)  Platelets 150 - 450 x10E3/uL 77(LL) 84(L) 91(L)   Lipid Panel     Component Value Date/Time   CHOL 157 03/23/2021 0000   TRIG 132 03/23/2021 0000   HDL 34 (L) 03/23/2021 0000   CHOLHDL 4 11/12/2018 0847   VLDL 29.8 11/12/2018 0847   LDLCALC 99 03/23/2021 0000   LABVLDL 24 03/23/2021 0000     Lipid Panel Cholesterol, total 139.000 m 11/12/2018 HDL 37.700 mg 11/12/2018 LDL 71.000 mg 11/12/2018 Triglycerides 149.000 m 11/12/2018    HEMOGLOBIN A1C No results found for: HGBA1C, MPG TSH Recent Labs    06/06/21 1402  TSH 0.12*    External labs:   Labs 12/05/2020:  Hb 12.2/HCT 36.9, WBC 3.3, platelets 94. Microcytic indicis.  Sodium 142, potassium 4.0, BUN 25, creatinine 0.91, CMP otherwise normal. Serum glucose 105 mg.  Labs 11/09/2020:  TSH normal.  Medications and allergies   Allergies  Allergen Reactions   Augmentin [Amoxicillin-Pot Clavulanate] Diarrhea    SOB     Current Meds  Medication Sig   acetaminophen (TYLENOL) 500 MG tablet Take 1,000 mg by mouth every 6 (six) hours as needed for moderate pain.   apixaban (ELIQUIS)  2.5 MG TABS tablet Take 1 tablet (2.5 mg total) by mouth 2 (two) times daily.   doxazosin (CARDURA) 2 MG tablet Take 1 tablet (2 mg total) by mouth at bedtime.   gabapentin (NEURONTIN) 300 MG capsule Take 2 capsules (600 mg total) by mouth 3 (three) times daily.   HYDROcodone-acetaminophen (NORCO/VICODIN) 5-325 MG tablet Take 1 tablet by mouth every 6 (six) hours as needed for moderate pain.   ipratropium (ATROVENT) 0.06 % nasal spray Place 1 spray into both nostrils 2 (two) times daily as needed for rhinitis.   ketoconazole (NIZORAL) 2 % shampoo Apply 1 application topically 2 (two) times a week.   levothyroxine (SYNTHROID) 150 MCG tablet Take 1 tablet (150 mcg total) by mouth daily.   Lidocaine 4 % PTCH Apply 0.5 patches topically daily as needed (pain). Uses 1/2 patch daily.   lidocaine-prilocaine (EMLA) cream Apply 1 application topically as needed for up to 30 doses. (Patient taking differently: Apply 1 application topically daily as needed (port access).)   loratadine (CLARITIN) 10 MG tablet Take 10 mg by mouth daily as needed (allergies). Allertec   Melatonin 5 MG CHEW Chew 5 mg by mouth at bedtime.   Polyethyl Glycol-Propyl Glycol (DRY EYE RELIEF) 0.4-0.3 % GEL ophthalmic gel 1 application 4 (four) times daily as needed (dry eyes).   potassium chloride (MICRO-K) 10 MEQ CR capsule Take 10 mEq by mouth in the morning and at bedtime.  propranolol ER (INDERAL LA) 80 MG 24 hr capsule TAKE ONE CAPSULE BY MOUTH ONE TIME DAILY   simvastatin (ZOCOR) 40 MG tablet TAKE ONE TABLET BY MOUTH DAILY AT BEDTIME (Patient taking differently: Take 40 mg by mouth daily at 6 PM.)   venetoclax 100 MG TABS Take 400 mg by mouth daily. Takes 400 mg daily for 2 weeks and stops for 4 weeks.   Radiology:   MRI of the brain 09/06/2020: Chronic microvascular ischemic disease. Remote lacunar infarct left cerebellar hemisphere.  CTA Head & Neck 09/16/2020: 1.  No evidence of acute intracranial abnormality. However, CT  is relatively insensitive for the detection of acute infarct within the first 24-48 hours, and MRI may be indicated if there is high clinical suspicion.  2.  Multifocal high-grade stenosis versus occlusion of the nondominant right vertebral artery, which although likely chronic could be related to the reported complaint of dizziness. Consider neurovascular referral for further assessment.  3.  Lobulated soft tissue density lesion interposed between the right levator scapula and trapezius muscles measuring up to 2.2 cm, which is favored to represent a venous malformation with associated phlebolith and could be further evaluated with ultrasound.   Aortic arch: No significant stenosis of the origins of the major arch vessels.  Left carotid system: Atherosclerotic calcification at the common carotid artery bifurcation without evidence of significant (50% or greater) stenosis or occlusion.  Right carotid system: Atherosclerotic calcification at the common carotid artery bifurcation without evidence of significant (50% or greater) stenosis or occlusion.  Vertebral arteries: Left dominant. Multifocal high-grade stenosis versus occlusion, including areas of nonopacification of the V2, V3, and V4 segments, of the nondominant right vertebral artery. Mild to moderate atherosclerotic narrowing at the origin of the left vertebral artery without substantial narrowing distally.   Cardiac Studies:   Exercise Treadmill Stress Test 05/26/2018: Indication: CP  The patient exercised on Bruce protocol for 6:00 min. Patient achieved 7.05 METS and reached HR 107 bpm, which is 76% of maximum age-predicted HR. Stress test terminated due to Fatigue.  Exercise capacity was below average for age. HR Response to Exercise: Attenuated secondary to medication. BP Response to Exercise: Normal resting BP- exaggerated response. Peak BP 200/80 mmHg Chest Pain: none. Arrhythmias: none. ST Changes: With peak exercise there was no ST-T  changes of ischemia.  Overall Impression: Inadequate subotpimal stress test Consider further cardiac evaluation including cardiac catheterization if clinically indicated.  ABD aortic duplex 10/16/2018: Mild ectasia involving mid and distal aorta. Normal iliac artery velocity. Mixed plaque noted.  LE Doppler 10/16/2018: Indication: Bilateral common iliac artery stent and left external iliac artery stent by Dr. Denyce Robert Moderate velocity increase at the right posterior tibial artery suggestive of > 50% stenosis. Moderate velocity increase at the left distal superficial femoral and posterior tibial arteries suggestive of >50% stenosis. Normal triphasic waveforms of both ankles. Mildly decreased bilateral resting ABI at 0.93.  Carotid artery duplex 05/04/2020: Right Carotid: Velocities in the right ICA are consistent with a 1-39% stenosis. Non-hemodynamically significant plaque <50% noted in the CCA. Left Carotid: Velocities in the left ICA are consistent with a 1-39% stenosis. Non-hemodynamically significant plaque <50% noted in the CCA. Vertebrals:  Left vertebral artery demonstrates antegrade flow. Right vertebral artery demonstrates high resistant flow.  Minimum flow noted in the vertebral artery. High resistant waveform is evident suggesting  distal occlusion vs high grade stenosis. Subclavians: Right subclavian artery flow was disturbed. Normal flow hemodynamics were seen in the left subclavian artery.  Echocardiogram  07/17/2019:  Left ventricle cavity is normal in size. Mild concentric hypertrophy of the left ventricle. Normal LV systolic function with EF 57%. Normal global wall motion. Doppler evidence of grade I (impaired) diastolic dysfunction, normal LAP.  Moderate (Grade II) mitral regurgitation. Inadequate TR jet to estimate pulmonary artery systolic pressure. Normal right atrial pressure.  IVC filter placement 03/17/2021: Successful placement of Bard Denali IVC filter.  The  filter is potentially retrievable.  EKG:   EKG 09/08/2021: Normal sinus rhythm heart rate 81 bpm, left axis deviation, left anterior fascicular block.  Low-voltage complexes.  No significant change from 12/05/2020.   Assessment   1.  SP IVC filter, Bard Denali retrievable filter placed 03/17/2021 2.  Peripheral arterial disease with history of bilateral iliac artery stents without recurrence of claudication.  Recommendations:   MARQUIST BINSTOCK is a 86 y.o. 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    He was admitted to the hospital with chest pain and was found to have subsegmental pulmonary embolism with right leg DVT and was started on Eliquis and underwent IVC placement on 03/17/2021 with Bard Denali filter.   From cardiac/vascular standpoint although he has peripheral artery disease, he has remained stable without claudication and peripheral arterial pulses are still intact.  No change in his physical exam since 6 months ago.  He does have intracerebral vascular disease and vertebral stenosis that is asymptomatic.  Blood pressures well controlled and lipids are at goal.    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  As he has tolerated Eliquis for 6 months, the event occurred exactly 6 months ago, we could potentially consider retrieving the IVC filter now and also discontinue Eliquis after the IVC is retrieved.  D/W  Dr. Burney Gauze previosly , he prefers to continue Eliquis for now, but agrees for IVC retrieval.  I have discussed with the patient  regarding less than 1% risk of perforation,  embolic complication, bleeding complication related to this.      [name], MD, [facility] 11/28/2021, 7:48 AM Office: [phone]

## 2021-11-27 NOTE — H&P (View-Only) (Signed)
Primary Physician/Referring:  [name], MD  Patient ID: Victor [name], male    DOB: [birth year], 86 y.o.   MRN: [id number]  IVC filter retreival (removal of Bard IVC filter)  HPI:    Victor [name]  is a 86 y.o. 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    He was admitted to the hospital with chest pain and was found to have subsegmental pulmonary embolism with right leg DVT and was started on Eliquis and underwent IVC placement on 03/17/2021 with Bard Denali filter.  He is presently tolerating anticoagulation well without bleeding diathesis.  He has mild right leg edema.  He has not had any further chest pain.  He denies any dyspnea, PND or orthopnea.  He was seen by me about 2 to 3 months ago, we had extensive discussions regarding IVC filter retrieval for prevention of long-term complications including perforation.  Patient now presents for the procedure.  I have also discussed with oncology/hematology as well who are in favor of retrieval if possible.  There is no further indication for filter, as he is presently tolerating anticoagulation for his prior DVT and PE.  He continues to have low platelet count.  Past Medical History:  Diagnosis Date   Arthritis    Atherosclerosis    Atherosclerotic PVD with intermittent claudication (HCC)    stent left leg   BPH (benign prostatic hyperplasia)    Bulging lumbar disc    Cancer ([place]) 11/12/14   vocal cord  carcinoma in situ , radiation; thyroid cancer   Carotid bruit    COPD (chronic obstructive pulmonary disease) (HCC)    DDD (degenerative disc disease), lumbar    Dysphonia    Dysplasia of true vocal cord    GERD  (gastroesophageal reflux disease)    H/O carotid atherosclerosis    b/l   Hyperlipidemia    Hypothyroidism    Occasional tremors    left hand managed with propranolol   Peripheral vascular angioplasty status with implants and grafts    Precancerous lesion 03/05/2018   premelanoma removed from back.    Radiation 03/10/15- 04/18/16   Larynx   Sleep apnea    hasn't used CPAP in years   Spondylosis of lumbosacral region    Thyroid disease    Past Surgical History:  Procedure Laterality Date   APPENDECTOMY     COLONOSCOPY W/ POLYPECTOMY     DIODE LASER APPLICATION Left 95/62/1308   Procedure: DIODE LASER APPLICATION;  Surgeon: Hayden Pedro, MD;  Location: [place];  Service: Ophthalmology;  Laterality: Left;   IR IMAGING GUIDED PORT INSERTION  01/16/2021   IR IVC FILTER PLMT / S&I /IMG GUID/MOD SED  03/17/2021   MEMBRANE PEEL Left 08/18/2019   Procedure: MEMBRANE PEEL;  Surgeon: Hayden Pedro, MD;  Location: [place];  Service: Ophthalmology;  Laterality: Left;   MOUTH SURGERY     tooth extraction   PARS PLANA VITRECTOMY Left 08/18/2019   PARS PLANA VITRECTOMY 27 GAUGE (Left)   PARS PLANA VITRECTOMY 27 GAUGE Left 08/18/2019   Procedure: PARS PLANA VITRECTOMY 27 GAUGE;  Surgeon: Hayden Pedro, MD;  Location: [place];  Service: Ophthalmology;  Laterality: Left;   PHOTOCOAGULATION WITH LASER Left 08/18/2019   Procedure: PHOTOCOAGULATION WITH  LASER;  Surgeon: Hayden Pedro, MD;  Location: [place];  Service: Ophthalmology;  Laterality: Left;   THYROID SURGERY     TONSILLECTOMY     vocal cord biopsy  11/12/14   squamous cell carcinoma in situ   Family History  Problem Relation Age of Onset   Colon cancer Father    Cancer Sister        Died of metastatic cancer   Leukemia Neg Hx     Social History   Tobacco Use   Smoking status: Former    Packs/day: 2.00    Years: 51.00    Pack years: 102.00    Types: Cigarettes    Start date: 72    Quit date: 03/05/2004    Years since  quitting: 17.7   Smokeless tobacco: Never  Substance Use Topics   Alcohol use: Yes    Alcohol/week: 3.0 standard drinks    Types: 3 Glasses of wine per week    Comment: occ   Marital Status: Married  ROS  Review of Systems  Cardiovascular:  Positive for leg swelling. Negative for chest pain and dyspnea on exertion.  Musculoskeletal:  Positive for arthritis. Negative for muscle cramps.  Gastrointestinal:  Negative for melena.  Objective  Blood pressure (!) 136/58, pulse 85, temperature 98.7 F (37.1 C), temperature source Oral, resp. rate 16, height 5\' 10"  (1.778 m), weight 95.3 kg, SpO2 98 %.  Vitals with BMI 11/28/2021 11/22/2021 11/17/2021  Height 5\' 10"  - -  Weight 210 lbs - -  BMI 54.00 - -  Systolic 867 619 509  Diastolic 58 63 51  Pulse 85 74 68     Physical Exam Neck:     Vascular: No JVD.  Cardiovascular:     Rate and Rhythm: Normal rate and regular rhythm.     Pulses: Intact distal pulses.          Carotid pulses are 2+ on the right side and 2+ on the left side.      Femoral pulses are 2+ on the right side and 1+ on the left side.      Popliteal pulses are 2+ on the right side and 1+ on the left side.       Dorsalis pedis pulses are 2+ on the right side and 1+ on the left side.       Posterior tibial pulses are 1+ on the right side and 0 on the left side.     Heart sounds: Normal heart sounds. No murmur heard.   No gallop.  Pulmonary:     Effort: Pulmonary effort is normal.     Breath sounds: Normal breath sounds.  Abdominal:     General: Bowel sounds are normal.     Palpations: Abdomen is soft.  Musculoskeletal:     Right lower leg: Edema (1-2 +) present.   Laboratory examination:   Recent Labs    10/23/21 0925 10/31/21 0918 11/13/21 0914 11/22/21 1117  NA 143 142 142 140  K 3.7 3.5 3.7 4.1  CL 107 106 106 104  CO2 29 30 28 26   GLUCOSE 115* 126* 130* 115*  BUN 16 13 12 8   CREATININE 1.09 1.05 1.10 1.02  CALCIUM 8.6* 9.0 8.7* 8.0*  GFRNONAA >60 >60  >60  --    estimated creatinine clearance is 63.6 mL/min (by C-G formula based on SCr of 1.02 mg/dL).  CMP Latest Ref Rng & Units 11/22/2021 11/13/2021 10/31/2021  Glucose 70 - 99 mg/dL 115(H) 130(H)  126(H)  BUN 8 - 27 mg/dL 8 12 13   Creatinine 0.76 - 1.27 mg/dL 1.02 1.10 1.05  Sodium 134 - 144 mmol/L 140 142 142  Potassium 3.5 - 5.2 mmol/L 4.1 3.7 3.5  Chloride 96 - 106 mmol/L 104 106 106  CO2 20 - 29 mmol/L 26 28 30   Calcium 8.6 - 10.2 mg/dL 8.0(L) 8.7(L) 9.0  Total Protein 6.5 - 8.1 g/dL - 5.4(L) 5.3(L)  Total Bilirubin 0.3 - 1.2 mg/dL - 0.6 0.4  Alkaline Phos 38 - 126 U/L - 77 69  AST 15 - 41 U/L - 10(L) 9(L)  ALT 0 - 44 U/L - 8 8   CBC Latest Ref Rng & Units 11/22/2021 11/13/2021 10/31/2021  WBC 3.4 - 10.8 x10E3/uL 3.7 5.8 3.0(L)  Hemoglobin 13.0 - 17.7 g/dL 11.2(L) 11.7(L) 10.8(L)  Hematocrit 37.5 - 51.0 % 33.9(L) 37.1(L) 34.4(L)  Platelets 150 - 450 x10E3/uL 77(LL) 84(L) 91(L)   Lipid Panel     Component Value Date/Time   CHOL 157 03/23/2021 0000   TRIG 132 03/23/2021 0000   HDL 34 (L) 03/23/2021 0000   CHOLHDL 4 11/12/2018 0847   VLDL 29.8 11/12/2018 0847   LDLCALC 99 03/23/2021 0000   LABVLDL 24 03/23/2021 0000     Lipid Panel Cholesterol, total 139.000 m 11/12/2018 HDL 37.700 mg 11/12/2018 LDL 71.000 mg 11/12/2018 Triglycerides 149.000 m 11/12/2018    HEMOGLOBIN A1C No results found for: HGBA1C, MPG TSH Recent Labs    06/06/21 1402  TSH 0.12*    External labs:   Labs 12/05/2020:  Hb 12.2/HCT 36.9, WBC 3.3, platelets 94. Microcytic indicis.  Sodium 142, potassium 4.0, BUN 25, creatinine 0.91, CMP otherwise normal. Serum glucose 105 mg.  Labs 11/09/2020:  TSH normal.  Medications and allergies   Allergies  Allergen Reactions   Augmentin [Amoxicillin-Pot Clavulanate] Diarrhea    SOB     Current Meds  Medication Sig   acetaminophen (TYLENOL) 500 MG tablet Take 1,000 mg by mouth every 6 (six) hours as needed for moderate pain.   apixaban (ELIQUIS)  2.5 MG TABS tablet Take 1 tablet (2.5 mg total) by mouth 2 (two) times daily.   doxazosin (CARDURA) 2 MG tablet Take 1 tablet (2 mg total) by mouth at bedtime.   gabapentin (NEURONTIN) 300 MG capsule Take 2 capsules (600 mg total) by mouth 3 (three) times daily.   HYDROcodone-acetaminophen (NORCO/VICODIN) 5-325 MG tablet Take 1 tablet by mouth every 6 (six) hours as needed for moderate pain.   ipratropium (ATROVENT) 0.06 % nasal spray Place 1 spray into both nostrils 2 (two) times daily as needed for rhinitis.   ketoconazole (NIZORAL) 2 % shampoo Apply 1 application topically 2 (two) times a week.   levothyroxine (SYNTHROID) 150 MCG tablet Take 1 tablet (150 mcg total) by mouth daily.   Lidocaine 4 % PTCH Apply 0.5 patches topically daily as needed (pain). Uses 1/2 patch daily.   lidocaine-prilocaine (EMLA) cream Apply 1 application topically as needed for up to 30 doses. (Patient taking differently: Apply 1 application topically daily as needed (port access).)   loratadine (CLARITIN) 10 MG tablet Take 10 mg by mouth daily as needed (allergies). Allertec   Melatonin 5 MG CHEW Chew 5 mg by mouth at bedtime.   Polyethyl Glycol-Propyl Glycol (DRY EYE RELIEF) 0.4-0.3 % GEL ophthalmic gel 1 application 4 (four) times daily as needed (dry eyes).   potassium chloride (MICRO-K) 10 MEQ CR capsule Take 10 mEq by mouth in the morning and at bedtime.  propranolol ER (INDERAL LA) 80 MG 24 hr capsule TAKE ONE CAPSULE BY MOUTH ONE TIME DAILY   simvastatin (ZOCOR) 40 MG tablet TAKE ONE TABLET BY MOUTH DAILY AT BEDTIME (Patient taking differently: Take 40 mg by mouth daily at 6 PM.)   venetoclax 100 MG TABS Take 400 mg by mouth daily. Takes 400 mg daily for 2 weeks and stops for 4 weeks.   Radiology:   MRI of the brain 09/06/2020: Chronic microvascular ischemic disease. Remote lacunar infarct left cerebellar hemisphere.  CTA Head & Neck 09/16/2020: 1.  No evidence of acute intracranial abnormality. However, CT  is relatively insensitive for the detection of acute infarct within the first 24-48 hours, and MRI may be indicated if there is high clinical suspicion.  2.  Multifocal high-grade stenosis versus occlusion of the nondominant right vertebral artery, which although likely chronic could be related to the reported complaint of dizziness. Consider neurovascular referral for further assessment.  3.  Lobulated soft tissue density lesion interposed between the right levator scapula and trapezius muscles measuring up to 2.2 cm, which is favored to represent a venous malformation with associated phlebolith and could be further evaluated with ultrasound.   Aortic arch: No significant stenosis of the origins of the major arch vessels.  Left carotid system: Atherosclerotic calcification at the common carotid artery bifurcation without evidence of significant (50% or greater) stenosis or occlusion.  Right carotid system: Atherosclerotic calcification at the common carotid artery bifurcation without evidence of significant (50% or greater) stenosis or occlusion.  Vertebral arteries: Left dominant. Multifocal high-grade stenosis versus occlusion, including areas of nonopacification of the V2, V3, and V4 segments, of the nondominant right vertebral artery. Mild to moderate atherosclerotic narrowing at the origin of the left vertebral artery without substantial narrowing distally.   Cardiac Studies:   Exercise Treadmill Stress Test 05/26/2018: Indication: CP  The patient exercised on Bruce protocol for 6:00 min. Patient achieved 7.05 METS and reached HR 107 bpm, which is 76% of maximum age-predicted HR. Stress test terminated due to Fatigue.  Exercise capacity was below average for age. HR Response to Exercise: Attenuated secondary to medication. BP Response to Exercise: Normal resting BP- exaggerated response. Peak BP 200/80 mmHg Chest Pain: none. Arrhythmias: none. ST Changes: With peak exercise there was no ST-T  changes of ischemia.  Overall Impression: Inadequate subotpimal stress test Consider further cardiac evaluation including cardiac catheterization if clinically indicated.  ABD aortic duplex 10/16/2018: Mild ectasia involving mid and distal aorta. Normal iliac artery velocity. Mixed plaque noted.  LE Doppler 10/16/2018: Indication: Bilateral common iliac artery stent and left external iliac artery stent by Dr. Denyce Robert Moderate velocity increase at the right posterior tibial artery suggestive of > 50% stenosis. Moderate velocity increase at the left distal superficial femoral and posterior tibial arteries suggestive of >50% stenosis. Normal triphasic waveforms of both ankles. Mildly decreased bilateral resting ABI at 0.93.  Carotid artery duplex 05/04/2020: Right Carotid: Velocities in the right ICA are consistent with a 1-39% stenosis. Non-hemodynamically significant plaque <50% noted in the CCA. Left Carotid: Velocities in the left ICA are consistent with a 1-39% stenosis. Non-hemodynamically significant plaque <50% noted in the CCA. Vertebrals:  Left vertebral artery demonstrates antegrade flow. Right vertebral artery demonstrates high resistant flow.  Minimum flow noted in the vertebral artery. High resistant waveform is evident suggesting  distal occlusion vs high grade stenosis. Subclavians: Right subclavian artery flow was disturbed. Normal flow hemodynamics were seen in the left subclavian artery.  Echocardiogram  07/17/2019:  Left ventricle cavity is normal in size. Mild concentric hypertrophy of the left ventricle. Normal LV systolic function with EF 57%. Normal global wall motion. Doppler evidence of grade I (impaired) diastolic dysfunction, normal LAP.  Moderate (Grade II) mitral regurgitation. Inadequate TR jet to estimate pulmonary artery systolic pressure. Normal right atrial pressure.  IVC filter placement 03/17/2021: Successful placement of Bard Denali IVC filter.  The  filter is potentially retrievable.  EKG:   EKG 09/08/2021: Normal sinus rhythm heart rate 81 bpm, left axis deviation, left anterior fascicular block.  Low-voltage complexes.  No significant change from 12/05/2020.   Assessment   1.  SP IVC filter, Bard Denali retrievable filter placed 03/17/2021 2.  Peripheral arterial disease with history of bilateral iliac artery stents without recurrence of claudication.  Recommendations:   Victor [name] is a 86 y.o. 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    He was admitted to the hospital with chest pain and was found to have subsegmental pulmonary embolism with right leg DVT and was started on Eliquis and underwent IVC placement on 03/17/2021 with Bard Denali filter.   From cardiac/vascular standpoint although he has peripheral artery disease, he has remained stable without claudication and peripheral arterial pulses are still intact.  No change in his physical exam since 6 months ago.  He does have intracerebral vascular disease and vertebral stenosis that is asymptomatic.  Blood pressures well controlled and lipids are at goal.    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  As he has tolerated Eliquis for 6 months, the event occurred exactly 6 months ago, we could potentially consider retrieving the IVC filter now and also discontinue Eliquis after the IVC is retrieved.  D/W  Dr. Burney Gauze previosly , he prefers to continue Eliquis for now, but agrees for IVC retrieval.  I have discussed with the patient  regarding less than 1% risk of perforation,  embolic complication, bleeding complication related to this.      [name], MD, [facility] 11/28/2021, 7:48 AM Office: [phone]

## 2021-11-28 ENCOUNTER — Ambulatory Visit (HOSPITAL_COMMUNITY)
Admission: RE | Admit: 2021-11-28 | Discharge: 2021-11-28 | Disposition: A | Discharge status: Home / Self Care | Payer: Medicare Other | Attending: Cardiology | Admitting: Cardiology

## 2021-11-28 ENCOUNTER — Encounter (HOSPITAL_COMMUNITY)
Admission: RE | Disposition: A | Discharge status: Home / Self Care | Payer: Self-pay | Source: Home / Self Care | Attending: Cardiology

## 2021-11-28 ENCOUNTER — Other Ambulatory Visit: Payer: Self-pay

## 2021-11-28 ENCOUNTER — Encounter (HOSPITAL_COMMUNITY): Payer: Self-pay | Admitting: Cardiology

## 2021-11-28 DIAGNOSIS — Z7901 Long term (current) use of anticoagulants: Secondary | ICD-10-CM | POA: Insufficient documentation

## 2021-11-28 DIAGNOSIS — I708 Atherosclerosis of other arteries: Secondary | ICD-10-CM | POA: Diagnosis not present

## 2021-11-28 DIAGNOSIS — I10 Essential (primary) hypertension: Secondary | ICD-10-CM | POA: Diagnosis not present

## 2021-11-28 DIAGNOSIS — E785 Hyperlipidemia, unspecified: Secondary | ICD-10-CM | POA: Diagnosis not present

## 2021-11-28 DIAGNOSIS — Z86718 Personal history of other venous thrombosis and embolism: Secondary | ICD-10-CM | POA: Diagnosis not present

## 2021-11-28 DIAGNOSIS — G250 Essential tremor: Secondary | ICD-10-CM | POA: Diagnosis not present

## 2021-11-28 DIAGNOSIS — I6523 Occlusion and stenosis of bilateral carotid arteries: Secondary | ICD-10-CM | POA: Diagnosis not present

## 2021-11-28 DIAGNOSIS — Z95828 Presence of other vascular implants and grafts: Secondary | ICD-10-CM

## 2021-11-28 DIAGNOSIS — Z87891 Personal history of nicotine dependence: Secondary | ICD-10-CM | POA: Insufficient documentation

## 2021-11-28 DIAGNOSIS — Z4589 Encounter for adjustment and management of other implanted devices: Secondary | ICD-10-CM | POA: Insufficient documentation

## 2021-11-28 HISTORY — PX: IVC FILTER REMOVAL: CATH118246

## 2021-11-28 SURGERY — IVC FILTER REMOVAL
Anesthesia: LOCAL

## 2021-11-28 MED ORDER — FENTANYL CITRATE (PF) 100 MCG/2ML IJ SOLN
INTRAMUSCULAR | Status: AC
  Filled 2021-11-28: qty 2

## 2021-11-28 MED ORDER — MIDAZOLAM HCL 2 MG/2ML IJ SOLN
INTRAMUSCULAR | Status: DC | PRN
  Administered 2021-11-28 (×2): 1 mg via INTRAVENOUS

## 2021-11-28 MED ORDER — FENTANYL CITRATE (PF) 100 MCG/2ML IJ SOLN
INTRAMUSCULAR | Status: DC | PRN
  Administered 2021-11-28 (×2): 25 ug via INTRAVENOUS
  Administered 2021-11-28: 50 ug via INTRAVENOUS

## 2021-11-28 MED ORDER — SODIUM CHLORIDE 0.9% FLUSH: 3.0000 mL | Freq: Two times a day (BID) | INTRAVENOUS | Status: DC

## 2021-11-28 MED ORDER — SODIUM CHLORIDE 0.9% FLUSH: 3.0000 mL | INTRAVENOUS | Status: DC | PRN

## 2021-11-28 MED ORDER — MIDAZOLAM HCL 2 MG/2ML IJ SOLN
INTRAMUSCULAR | Status: AC
  Filled 2021-11-28: qty 2

## 2021-11-28 MED ORDER — LIDOCAINE HCL (PF) 1 % IJ SOLN
INTRAMUSCULAR | Status: DC | PRN
  Administered 2021-11-28: 5 mL via INTRADERMAL

## 2021-11-28 MED ORDER — LIDOCAINE HCL (PF) 1 % IJ SOLN
INTRAMUSCULAR | Status: AC
  Filled 2021-11-28: qty 30

## 2021-11-28 MED ORDER — SODIUM CHLORIDE 0.9 % IV SOLN: 250.0000 mL | INTRAVENOUS | Status: DC | PRN

## 2021-11-28 MED ORDER — SODIUM CHLORIDE 0.9 % IV BOLUS
500.0000 mL | Freq: Once | INTRAVENOUS | Status: AC
  Administered 2021-11-28: 06:00:00 500 mL via INTRAVENOUS

## 2021-11-28 MED ORDER — SODIUM CHLORIDE 0.9 % IV SOLN: INTRAVENOUS | Status: DC

## 2021-11-28 MED ORDER — IODIXANOL 320 MG/ML IV SOLN
INTRAVENOUS | Status: DC | PRN
  Administered 2021-11-28: 09:00:00 60 mL

## 2021-11-28 MED ORDER — HEPARIN (PORCINE) IN NACL 1000-0.9 UT/500ML-% IV SOLN
INTRAVENOUS | Status: DC | PRN
  Administered 2021-11-28: 500 mL

## 2021-11-28 MED ORDER — HEPARIN (PORCINE) IN NACL 1000-0.9 UT/500ML-% IV SOLN
INTRAVENOUS | Status: AC
  Filled 2021-11-28: qty 500

## 2021-11-28 SURGICAL SUPPLY — 10 items
DEVICE ONE SNARE 15MM (VASCULAR PRODUCTS) ×2 IMPLANT
GLIDEWIRE NITREX 0.018X80X5 (WIRE) ×2
GUIDEWIRE NITREX 0.018X80X5 (WIRE) IMPLANT
KIT MICROPUNCTURE NIT STIFF (SHEATH) ×2 IMPLANT
SHEATH PINNACLE 5F 10CM (SHEATH) ×2 IMPLANT
SHEATH PINNACLE ST 7F 45CM (SHEATH) ×2 IMPLANT
SHEATH PROBE COVER 6X72 (BAG) ×2 IMPLANT
SHEATH SHUTTLE 7FR (SHEATH) ×2 IMPLANT
TRAY PV CATH (CUSTOM PROCEDURE TRAY) ×3 IMPLANT
WIRE HI TORQ VERSACORE J 260CM (WIRE) ×2 IMPLANT

## 2021-11-28 NOTE — Interval H&P Note (Signed)
History and Physical Interval Note:  11/28/2021 7:50 AM  Victor [name]  has presented today for surgery, with the diagnosis of PAD.  The various methods of treatment have been discussed with the patient and family. After consideration of risks, benefits and other options for treatment, the patient has consented to  Procedure(s): IVC FILTER REMOVAL (N/A) as a surgical intervention.  The patient's history has been reviewed, patient examined, no change in status, stable for surgery.  I have reviewed the patient's chart and labs.  Questions were answered to the patient's satisfaction.     [name]

## 2021-11-28 NOTE — Discharge Instructions (Addendum)
Do not start Eliquis for 2 days

## 2021-12-03 ENCOUNTER — Encounter: Payer: Self-pay | Admitting: Family Medicine

## 2021-12-04 ENCOUNTER — Other Ambulatory Visit: Payer: Medicare Other

## 2021-12-04 ENCOUNTER — Inpatient Hospital Stay: Payer: Medicare Other

## 2021-12-04 ENCOUNTER — Other Ambulatory Visit: Payer: Self-pay

## 2021-12-04 DIAGNOSIS — C9200 Acute myeloblastic leukemia, not having achieved remission: Secondary | ICD-10-CM | POA: Diagnosis not present

## 2021-12-04 DIAGNOSIS — C9500 Acute leukemia of unspecified cell type not having achieved remission: Secondary | ICD-10-CM

## 2021-12-04 LAB — CBC WITH DIFFERENTIAL (CANCER CENTER ONLY)
Abs Immature Granulocytes: 0.02 10*3/uL (ref 0.00–0.07)
Basophils Absolute: 0 10*3/uL (ref 0.0–0.1)
Basophils Relative: 2 %
Eosinophils Absolute: 0 10*3/uL (ref 0.0–0.5)
Eosinophils Relative: 0 %
HCT: 30.7 % — ABNORMAL LOW (ref 39.0–52.0)
Hemoglobin: 9.6 g/dL — ABNORMAL LOW (ref 13.0–17.0)
Immature Granulocytes: 2 %
Lymphocytes Relative: 42 %
Lymphs Abs: 0.5 10*3/uL — ABNORMAL LOW (ref 0.7–4.0)
MCH: 29 pg (ref 26.0–34.0)
MCHC: 31.3 g/dL (ref 30.0–36.0)
MCV: 92.7 fL (ref 80.0–100.0)
Monocytes Absolute: 0.1 10*3/uL (ref 0.1–1.0)
Monocytes Relative: 10 %
Neutro Abs: 0.5 10*3/uL — ABNORMAL LOW (ref 1.7–7.7)
Neutrophils Relative %: 44 %
Platelet Count: 104 10*3/uL — ABNORMAL LOW (ref 150–400)
RBC: 3.31 MIL/uL — ABNORMAL LOW (ref 4.22–5.81)
RDW: 17.5 % — ABNORMAL HIGH (ref 11.5–15.5)
WBC Count: 1.2 10*3/uL — ABNORMAL LOW (ref 4.0–10.5)
nRBC: 1.7 % — ABNORMAL HIGH (ref 0.0–0.2)

## 2021-12-04 LAB — CMP (CANCER CENTER ONLY)
ALT: 13 U/L (ref 0–44)
AST: 9 U/L — ABNORMAL LOW (ref 15–41)
Albumin: 3.5 g/dL (ref 3.5–5.0)
Alkaline Phosphatase: 57 U/L (ref 38–126)
Anion gap: 8 (ref 5–15)
BUN: 11 mg/dL (ref 8–23)
CO2: 28 mmol/L (ref 22–32)
Calcium: 8.6 mg/dL — ABNORMAL LOW (ref 8.9–10.3)
Chloride: 104 mmol/L (ref 98–111)
Creatinine: 1 mg/dL (ref 0.61–1.24)
GFR, Estimated: >60 mL/min (ref >60)
Glucose, Bld: 158 mg/dL — ABNORMAL HIGH (ref 70–99)
Potassium: 3.6 mmol/L (ref 3.5–5.1)
Sodium: 140 mmol/L (ref 135–145)
Total Bilirubin: 0.7 mg/dL (ref 0.3–1.2)
Total Protein: 5.5 g/dL — ABNORMAL LOW (ref 6.5–8.1)

## 2021-12-04 LAB — MAGNESIUM: Magnesium: 2.1 mg/dL (ref 1.7–2.4)

## 2021-12-04 LAB — SAMPLE TO BLOOD BANK

## 2021-12-04 NOTE — Patient Instructions (Signed)

## 2021-12-11 ENCOUNTER — Inpatient Hospital Stay: Payer: Medicare Other | Attending: Oncology

## 2021-12-11 ENCOUNTER — Other Ambulatory Visit: Payer: Medicare Other

## 2021-12-11 ENCOUNTER — Other Ambulatory Visit: Payer: Self-pay

## 2021-12-11 ENCOUNTER — Inpatient Hospital Stay: Payer: Medicare Other

## 2021-12-11 ENCOUNTER — Ambulatory Visit (HOSPITAL_BASED_OUTPATIENT_CLINIC_OR_DEPARTMENT_OTHER)
Admission: RE | Admit: 2021-12-11 | Discharge: 2021-12-11 | Disposition: A | Discharge status: Home / Self Care | Payer: Medicare Other | Source: Ambulatory Visit | Attending: Hematology & Oncology | Admitting: Hematology & Oncology

## 2021-12-11 VITALS — BP 139/55 | HR 84 | Temp 98.7°F | Resp 18

## 2021-12-11 DIAGNOSIS — R0602 Shortness of breath: Secondary | ICD-10-CM

## 2021-12-11 DIAGNOSIS — C9500 Acute leukemia of unspecified cell type not having achieved remission: Secondary | ICD-10-CM

## 2021-12-11 DIAGNOSIS — I2699 Other pulmonary embolism without acute cor pulmonale: Secondary | ICD-10-CM | POA: Diagnosis not present

## 2021-12-11 DIAGNOSIS — Z5111 Encounter for antineoplastic chemotherapy: Secondary | ICD-10-CM | POA: Insufficient documentation

## 2021-12-11 DIAGNOSIS — Z79899 Other long term (current) drug therapy: Secondary | ICD-10-CM | POA: Diagnosis not present

## 2021-12-11 DIAGNOSIS — I82401 Acute embolism and thrombosis of unspecified deep veins of right lower extremity: Secondary | ICD-10-CM | POA: Diagnosis not present

## 2021-12-11 DIAGNOSIS — R911 Solitary pulmonary nodule: Secondary | ICD-10-CM | POA: Diagnosis not present

## 2021-12-11 DIAGNOSIS — Z452 Encounter for adjustment and management of vascular access device: Secondary | ICD-10-CM

## 2021-12-11 LAB — CMP (CANCER CENTER ONLY)
ALT: 7 U/L (ref 0–44)
AST: 9 U/L — ABNORMAL LOW (ref 15–41)
Albumin: 3.4 g/dL — ABNORMAL LOW (ref 3.5–5.0)
Alkaline Phosphatase: 71 U/L (ref 38–126)
Anion gap: 7 (ref 5–15)
BUN: 11 mg/dL (ref 8–23)
CO2: 28 mmol/L (ref 22–32)
Calcium: 8.5 mg/dL — ABNORMAL LOW (ref 8.9–10.3)
Chloride: 104 mmol/L (ref 98–111)
Creatinine: 1.03 mg/dL (ref 0.61–1.24)
GFR, Estimated: >60 mL/min (ref >60)
Glucose, Bld: 125 mg/dL — ABNORMAL HIGH (ref 70–99)
Potassium: 3.6 mmol/L (ref 3.5–5.1)
Sodium: 139 mmol/L (ref 135–145)
Total Bilirubin: 0.7 mg/dL (ref 0.3–1.2)
Total Protein: 5.3 g/dL — ABNORMAL LOW (ref 6.5–8.1)

## 2021-12-11 LAB — CBC WITH DIFFERENTIAL (CANCER CENTER ONLY)
Abs Immature Granulocytes: 0.04 10*3/uL (ref 0.00–0.07)
Basophils Absolute: 0 10*3/uL (ref 0.0–0.1)
Basophils Relative: 1 %
Eosinophils Absolute: 0 10*3/uL (ref 0.0–0.5)
Eosinophils Relative: 0 %
HCT: 30.2 % — ABNORMAL LOW (ref 39.0–52.0)
Hemoglobin: 9.4 g/dL — ABNORMAL LOW (ref 13.0–17.0)
Immature Granulocytes: 2 %
Lymphocytes Relative: 33 %
Lymphs Abs: 0.7 10*3/uL (ref 0.7–4.0)
MCH: 28.8 pg (ref 26.0–34.0)
MCHC: 31.1 g/dL (ref 30.0–36.0)
MCV: 92.6 fL (ref 80.0–100.0)
Monocytes Absolute: 0.2 10*3/uL (ref 0.1–1.0)
Monocytes Relative: 10 %
Neutro Abs: 1.2 10*3/uL — ABNORMAL LOW (ref 1.7–7.7)
Neutrophils Relative %: 54 %
Platelet Count: 122 10*3/uL — ABNORMAL LOW (ref 150–400)
RBC: 3.26 MIL/uL — ABNORMAL LOW (ref 4.22–5.81)
RDW: 17.5 % — ABNORMAL HIGH (ref 11.5–15.5)
WBC Count: 2.1 10*3/uL — ABNORMAL LOW (ref 4.0–10.5)
nRBC: 0.9 % — ABNORMAL HIGH (ref 0.0–0.2)

## 2021-12-11 LAB — MAGNESIUM: Magnesium: 2.3 mg/dL (ref 1.7–2.4)

## 2021-12-11 MED ORDER — HEPARIN SOD (PORK) LOCK FLUSH 100 UNIT/ML IV SOLN
250.0000 [IU] | Freq: Once | INTRAVENOUS | Status: AC
  Administered 2021-12-11: 500 [IU]

## 2021-12-11 MED ORDER — SODIUM CHLORIDE 0.9% FLUSH
10.0000 mL | Freq: Once | INTRAVENOUS | Status: AC
  Administered 2021-12-11: 10 mL

## 2021-12-11 NOTE — Patient Instructions (Signed)

## 2021-12-11 NOTE — Progress Notes (Signed)
Patient complained of SOB today, states his o2 running around 90% at home, it is currently 95% in office, labs okay, md aware. Chest x ray ordered.

## 2021-12-13 ENCOUNTER — Ambulatory Visit: Payer: Medicare Other | Admitting: Cardiology

## 2021-12-13 ENCOUNTER — Encounter: Payer: Self-pay | Admitting: Family Medicine

## 2021-12-13 ENCOUNTER — Ambulatory Visit (INDEPENDENT_AMBULATORY_CARE_PROVIDER_SITE_OTHER): Payer: Medicare Other | Admitting: Family Medicine

## 2021-12-13 ENCOUNTER — Other Ambulatory Visit: Payer: Self-pay | Admitting: Hematology & Oncology

## 2021-12-13 ENCOUNTER — Other Ambulatory Visit: Payer: Self-pay

## 2021-12-13 VITALS — BP 122/66 | HR 82 | Temp 97.0°F | Ht 70.0 in | Wt 214.8 lb

## 2021-12-13 DIAGNOSIS — E890 Postprocedural hypothyroidism: Secondary | ICD-10-CM

## 2021-12-13 DIAGNOSIS — R202 Paresthesia of skin: Secondary | ICD-10-CM

## 2021-12-13 DIAGNOSIS — C9501 Acute leukemia of unspecified cell type, in remission: Secondary | ICD-10-CM

## 2021-12-13 NOTE — Progress Notes (Signed)
Established Patient Office Visit  Subjective:  Patient ID: Victor [name], male    DOB: 06/24/1936  Age: 86 y.o. MRN: [id number]  CC:  Chief Complaint  Patient presents with   Pain    C/O cramping in neck with some pains x few months. Also neuropathy in hands and feet.      HPI Victor [name] presents for follow-up of hypothyroidism.  Status post thyroidectomy for thyroid cancer.  Continues with levothyroxine 150 mcg daily each morning on a fasting stomach.  Tells of cramping in his neck associated with yawning.  History of laryngeal cancer.  Has yearly follow-up with ENT.  Tells of paresthesias in his hands and feet not responding to Neurontin.  Denies pain with the paresthesias.  Pharmacist said that the dose was too low.  He is taking 300 mg 3 times daily.  Under treatment for acute leukemia.  Past Medical History:  Diagnosis Date   Arthritis    Atherosclerosis    Atherosclerotic PVD with intermittent claudication (HCC)    stent left leg   BPH (benign prostatic hyperplasia)    Bulging lumbar disc    Cancer ([place]) 11/12/14   vocal cord  carcinoma in situ , radiation; thyroid cancer   Carotid bruit    COPD (chronic obstructive pulmonary disease) (HCC)    DDD (degenerative disc disease), lumbar    Dysphonia    Dysplasia of true vocal cord    GERD (gastroesophageal reflux disease)    H/O carotid atherosclerosis    b/l   Hyperlipidemia    Hypothyroidism    Occasional tremors    left hand managed with propranolol   Peripheral vascular angioplasty status with implants and grafts    Precancerous lesion 03/05/2018   premelanoma removed from back.    Radiation 03/10/15- 04/18/16   Larynx   Sleep apnea    hasn't used CPAP in years   Spondylosis of lumbosacral region    Thyroid disease     Past Surgical History:  Procedure Laterality Date   APPENDECTOMY     COLONOSCOPY W/ POLYPECTOMY     DIODE LASER APPLICATION Left 62/83/6629   Procedure: DIODE LASER APPLICATION;  Surgeon:  Hayden Pedro, MD;  Location: [facility];  Service: Ophthalmology;  Laterality: Left;   IR IMAGING GUIDED PORT INSERTION  01/16/2021   IR IVC FILTER PLMT / S&I /IMG GUID/MOD SED  03/17/2021   IVC FILTER REMOVAL N/A 11/28/2021   Procedure: IVC FILTER REMOVAL;  Surgeon: [name], MD;  Location: [facility];  Service: Cardiovascular;  Laterality: N/A;   MEMBRANE PEEL Left 08/18/2019   Procedure: MEMBRANE PEEL;  Surgeon: Hayden Pedro, MD;  Location: [facility];  Service: Ophthalmology;  Laterality: Left;   MOUTH SURGERY     tooth extraction   PARS PLANA VITRECTOMY Left 08/18/2019   PARS PLANA VITRECTOMY 27 GAUGE (Left)   PARS PLANA VITRECTOMY 27 GAUGE Left 08/18/2019   Procedure: PARS PLANA VITRECTOMY 27 GAUGE;  Surgeon: Hayden Pedro, MD;  Location: [facility];  Service: Ophthalmology;  Laterality: Left;   PHOTOCOAGULATION WITH LASER Left 08/18/2019   Procedure: PHOTOCOAGULATION WITH LASER;  Surgeon: Hayden Pedro, MD;  Location: [facility];  Service: Ophthalmology;  Laterality: Left;   THYROID SURGERY     TONSILLECTOMY     vocal cord biopsy  11/12/14   squamous cell carcinoma in situ    Family History  Problem Relation Age of Onset   Colon cancer Father  Cancer Sister        Died of metastatic cancer   Leukemia Neg Hx     Social History   Socioeconomic History   Marital status: Married    Spouse name: Not on file   Number of children: 2   Years of education: Not on file   Highest education level: Not on file  Occupational History   Not on file  Tobacco Use   Smoking status: Former    Packs/day: 2.00    Years: 51.00    Pack years: 102.00    Types: Cigarettes    Start date: 51    Quit date: 03/05/2004    Years since quitting: 17.7   Smokeless tobacco: Never  Vaping Use   Vaping Use: Never used  Substance and Sexual Activity   Alcohol use: Yes    Alcohol/week: 3.0 standard drinks    Types: 3 Glasses of wine per week    Comment: occ   Drug use: No   Sexual activity:  Not on file  Other Topics Concern   Not on file  Social History Narrative   Not on file   Social Determinants of Health   Financial Resource Strain: Not on file  Food Insecurity: Not on file  Transportation Needs: Not on file  Physical Activity: Not on file  Stress: Not on file  Social Connections: Not on file  Intimate Partner Violence: Not on file    Outpatient Medications Prior to Visit  Medication Sig Dispense Refill   acetaminophen (TYLENOL) 500 MG tablet Take 1,000 mg by mouth every 6 (six) hours as needed for moderate pain.     AMBULATORY NON FORMULARY MEDICATION Premier Protein Vanilla 12 Bottle 2   apixaban (ELIQUIS) 2.5 MG TABS tablet Take 1 tablet (2.5 mg total) by mouth 2 (two) times daily. 60 tablet 6   doxazosin (CARDURA) 2 MG tablet Take 1 tablet (2 mg total) by mouth at bedtime. 90 tablet 3   gabapentin (NEURONTIN) 300 MG capsule Take 2 capsules (600 mg total) by mouth 3 (three) times daily. 240 capsule 4   HYDROcodone-acetaminophen (NORCO/VICODIN) 5-325 MG tablet Take 1 tablet by mouth every 6 (six) hours as needed for moderate pain.     ipratropium (ATROVENT) 0.06 % nasal spray Place 1 spray into both nostrils 2 (two) times daily as needed for rhinitis.     ketoconazole (NIZORAL) 2 % shampoo Apply 1 application topically 2 (two) times a week.     levothyroxine (SYNTHROID) 150 MCG tablet Take 1 tablet (150 mcg total) by mouth daily. 90 tablet 3   Lidocaine 4 % PTCH Apply 0.5 patches topically daily as needed (pain). Uses 1/2 patch daily.     lidocaine-prilocaine (EMLA) cream Apply 1 application topically as needed for up to 30 doses. (Patient taking differently: Apply 1 application topically daily as needed (port access).) 30 g 0   loratadine (CLARITIN) 10 MG tablet Take 10 mg by mouth daily as needed (allergies). Allertec     Melatonin 5 MG CHEW Chew 5 mg by mouth at bedtime.     Polyethyl Glycol-Propyl Glycol (DRY EYE RELIEF) 0.4-0.3 % GEL ophthalmic gel 1  application 4 (four) times daily as needed (dry eyes).     potassium chloride (MICRO-K) 10 MEQ CR capsule Take 10 mEq by mouth in the morning and at bedtime.     propranolol ER (INDERAL LA) 80 MG 24 hr capsule TAKE ONE CAPSULE BY MOUTH ONE TIME DAILY 90 capsule 0  simvastatin (ZOCOR) 40 MG tablet TAKE ONE TABLET BY MOUTH DAILY AT BEDTIME (Patient taking differently: Take 40 mg by mouth daily at 6 PM.) 90 tablet 3   venetoclax 100 MG TABS Take 400 mg by mouth daily. Takes 400 mg daily for 2 weeks and stops for 4 weeks.     No facility-administered medications prior to visit.    Allergies  Allergen Reactions   Augmentin [Amoxicillin-Pot Clavulanate] Diarrhea    SOB    ROS Review of Systems  Constitutional:  Positive for fatigue. Negative for diaphoresis, fever and unexpected weight change.  HENT: Negative.    Eyes:  Negative for photophobia and visual disturbance.  Respiratory: Negative.    Cardiovascular: Negative.   Gastrointestinal: Negative.   Endocrine: Negative for polyphagia and polyuria.  Genitourinary: Negative.   Musculoskeletal:  Negative for gait problem and joint swelling.  Neurological:  Positive for numbness. Negative for speech difficulty and weakness.     Objective:    Physical Exam Vitals and nursing note reviewed.  Constitutional:      General: He is not in acute distress.    Appearance: He is normal weight. He is ill-appearing. He is not toxic-appearing or diaphoretic.  HENT:     Head: Normocephalic and atraumatic.     Right Ear: External ear normal.     Left Ear: External ear normal.     Mouth/Throat:     Mouth: Mucous membranes are moist.     Pharynx: Oropharynx is clear. No oropharyngeal exudate or posterior oropharyngeal erythema.  Eyes:     General: No scleral icterus.       Right eye: No discharge.        Left eye: No discharge.     Extraocular Movements: Extraocular movements intact.     Conjunctiva/sclera: Conjunctivae normal.     Pupils:  Pupils are equal, round, and reactive to light.  Cardiovascular:     Rate and Rhythm: Normal rate and regular rhythm.  Pulmonary:     Effort: Pulmonary effort is normal. No respiratory distress.     Breath sounds: Normal breath sounds. No wheezing, rhonchi or rales.  Abdominal:     General: Bowel sounds are normal.  Musculoskeletal:       Legs:  Skin:    General: Skin is warm and dry.  Neurological:     Mental Status: He is alert and oriented to person, place, and time.  Psychiatric:        Mood and Affect: Mood normal.        Behavior: Behavior normal.    BP 122/66 (BP Location: Left Arm, Patient Position: Sitting, Cuff Size: Large)    Pulse 82    Temp (!) 97 F (36.1 C) (Temporal)    Ht $R'5\' 10"'Tp$  (1.778 m)    Wt 214 lb 12.8 oz (97.4 kg)    SpO2 97%    BMI 30.82 kg/m  Wt Readings from Last 3 Encounters:  12/13/21 214 lb 12.8 oz (97.4 kg)  11/28/21 210 lb (95.3 kg)  11/13/21 211 lb (95.7 kg)     Health Maintenance Due  Topic Date Due   Zoster Vaccines- Shingrix (2 of 2) 11/11/2017    There are no preventive care reminders to display for this patient.  Lab Results  Component Value Date   TSH 0.12 (L) 06/06/2021   Lab Results  Component Value Date   WBC 2.1 (L) 12/11/2021   HGB 9.4 (L) 12/11/2021   HCT 30.2 (L) 12/11/2021  MCV 92.6 12/11/2021   PLT 122 (L) 12/11/2021   Lab Results  Component Value Date   NA 139 12/11/2021   K 3.6 12/11/2021   CHLORIDE 105 03/30/2015   CO2 28 12/11/2021   GLUCOSE 125 (H) 12/11/2021   BUN 11 12/11/2021   CREATININE 1.03 12/11/2021   BILITOT 0.7 12/11/2021   ALKPHOS 71 12/11/2021   AST 9 (L) 12/11/2021   ALT 7 12/11/2021   PROT 5.3 (L) 12/11/2021   ALBUMIN 3.4 (L) 12/11/2021   CALCIUM 8.5 (L) 12/11/2021   ANIONGAP 7 12/11/2021   EGFR 73 11/22/2021   GFR 67.69 09/17/2019   Lab Results  Component Value Date   CHOL 157 03/23/2021   Lab Results  Component Value Date   HDL 34 (L) 03/23/2021   Lab Results  Component  Value Date   LDLCALC 99 03/23/2021   Lab Results  Component Value Date   TRIG 132 03/23/2021   Lab Results  Component Value Date   CHOLHDL 4 11/12/2018   No results found for: HGBA1C    Assessment & Plan:   Problem List Items Addressed This Visit       Endocrine   Postoperative hypothyroidism   Relevant Orders   TSH   Other Visit Diagnoses     Paresthesias    -  Primary   Relevant Orders   Vitamin B12       No orders of the defined types were placed in this encounter.   Follow-up: Return in about 3 months (around 03/12/2022).   We will have blood drawn at cancer center through his port.  Follow-up for hypothyroidism.  Believe the paresthesias could be secondary to treatment for acute leukemia.  Checking B12 levels they are unlikely to resolve.  Neurontin is unlikely to be curative and may not be necessary unless they become painful. [name], MD

## 2021-12-14 ENCOUNTER — Encounter: Payer: Self-pay | Admitting: Family Medicine

## 2021-12-15 ENCOUNTER — Telehealth (HOSPITAL_BASED_OUTPATIENT_CLINIC_OR_DEPARTMENT_OTHER): Payer: Self-pay

## 2021-12-18 ENCOUNTER — Encounter (HOSPITAL_BASED_OUTPATIENT_CLINIC_OR_DEPARTMENT_OTHER): Payer: Self-pay

## 2021-12-18 ENCOUNTER — Ambulatory Visit (HOSPITAL_BASED_OUTPATIENT_CLINIC_OR_DEPARTMENT_OTHER)
Admission: RE | Admit: 2021-12-18 | Discharge: 2021-12-18 | Disposition: A | Discharge status: Home / Self Care | Payer: Medicare Other | Source: Ambulatory Visit | Attending: Hematology & Oncology | Admitting: Hematology & Oncology

## 2021-12-18 ENCOUNTER — Other Ambulatory Visit: Payer: Self-pay

## 2021-12-18 DIAGNOSIS — C9501 Acute leukemia of unspecified cell type, in remission: Secondary | ICD-10-CM | POA: Insufficient documentation

## 2021-12-18 MED ORDER — IOHEXOL 300 MG/ML  SOLN
100.0000 mL | Freq: Once | INTRAMUSCULAR | Status: AC | PRN
  Administered 2021-12-18: 75 mL via INTRAVENOUS

## 2021-12-19 ENCOUNTER — Telehealth: Payer: Self-pay

## 2021-12-19 NOTE — Telephone Encounter (Signed)
Per Dr.Ennever, patient to bring a copy of his CT from yesterday 12/18/21 on a disc to Dr.Pardee tomorrow at his appt. Called Imaging who will get it ready for pt to pick up today. Patient aware and will get it today.

## 2021-12-21 ENCOUNTER — Encounter: Payer: Self-pay | Admitting: Family Medicine

## 2021-12-22 ENCOUNTER — Encounter: Payer: Self-pay | Admitting: Cardiology

## 2021-12-22 ENCOUNTER — Other Ambulatory Visit: Payer: Self-pay

## 2021-12-22 ENCOUNTER — Telehealth: Payer: Self-pay | Admitting: *Deleted

## 2021-12-22 ENCOUNTER — Ambulatory Visit: Payer: Medicare Other | Admitting: Cardiology

## 2021-12-22 VITALS — BP 107/62 | HR 75 | Temp 98.2°F | Resp 17 | Ht 70.0 in | Wt 213.2 lb

## 2021-12-22 DIAGNOSIS — I10 Essential (primary) hypertension: Secondary | ICD-10-CM

## 2021-12-22 DIAGNOSIS — I70203 Unspecified atherosclerosis of native arteries of extremities, bilateral legs: Secondary | ICD-10-CM

## 2021-12-22 DIAGNOSIS — E7800 Pure hypercholesterolemia, unspecified: Secondary | ICD-10-CM

## 2021-12-22 NOTE — Telephone Encounter (Signed)
Message received from patient stating that he is having a bronchoscopy next week and will need to cancel all of his appts scheduled for next week.  Pt states that appts can be rescheduled once bronchoscopy results are back.  Dr. [name] notified.

## 2021-12-22 NOTE — Progress Notes (Signed)
Primary Physician/Referring:  [name], MD  Patient ID: Victor [name], male    DOB: June 07, 1936, 86 y.o.   MRN: [id number]  IVC filter retreival (removal of Bard IVC filter)  HPI:    [name]  is a 86 y.o. 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  He had right leg DVT and subsegmental pulm embolism and had IVC filter placed 03/17/2021, underwent successful removal on 12/05/2020.  Eliquis has been continued as per hematology.  He now presents for follow-up.  From cardiac standpoint he is presently doing well and essentially remains asymptomatic except he has noticed marked dyspnea on exertion.  CT scan recently performed had revealed marked abnormality involving extensive infiltrate, differentials include but not limited to infectious etiology versus malignancy and he has not been scheduled for bronchoscopy.  From cardiac standpoint he remains without any chest pain or palpitations, no complications from the procedure.  Past Medical History:  Diagnosis Date   Arthritis    Atherosclerosis    Atherosclerotic PVD with intermittent claudication (HCC)    stent left leg   BPH (benign prostatic hyperplasia)    Bulging lumbar disc    Cancer ([place]) 11/12/14   vocal cord  carcinoma in situ , radiation; thyroid cancer   Carotid bruit    COPD (chronic obstructive pulmonary disease) (HCC)    DDD (degenerative disc disease), lumbar    Dysphonia    Dysplasia of true vocal cord    GERD (gastroesophageal reflux disease)    H/O carotid atherosclerosis    b/l   Hyperlipidemia    Hypothyroidism    Occasional tremors    left hand managed with propranolol   Peripheral vascular  angioplasty status with implants and grafts    Precancerous lesion 03/05/2018   premelanoma removed from back.    Radiation 03/10/15- 04/18/16   Larynx   Sleep apnea    hasn't used CPAP in years   Spondylosis of lumbosacral region    Thyroid disease    Past Surgical History:  Procedure Laterality Date   APPENDECTOMY     COLONOSCOPY W/ POLYPECTOMY     DIODE LASER APPLICATION Left 66/29/4765   Procedure: DIODE LASER APPLICATION;  Surgeon: Hayden Pedro, MD;  Location: [facility];  Service: Ophthalmology;  Laterality: Left;   IR IMAGING GUIDED PORT INSERTION  01/16/2021   IR IVC FILTER PLMT / S&I /IMG GUID/MOD SED  03/17/2021   IVC FILTER REMOVAL N/A 11/28/2021   Procedure: IVC FILTER REMOVAL;  Surgeon: [name], MD;  Location: [facility];  Service: Cardiovascular;  Laterality: N/A;   MEMBRANE PEEL Left 08/18/2019   Procedure: MEMBRANE PEEL;  Surgeon: Hayden Pedro, MD;  Location: [facility];  Service: Ophthalmology;  Laterality: Left;   MOUTH SURGERY     tooth extraction   PARS PLANA VITRECTOMY Left 08/18/2019   PARS PLANA VITRECTOMY 27 GAUGE (Left)   PARS PLANA VITRECTOMY 27 GAUGE Left 08/18/2019   Procedure: PARS PLANA VITRECTOMY 27 GAUGE;  Surgeon: Hayden Pedro, MD;  Location: [facility];  Service: Ophthalmology;  Laterality: Left;   PHOTOCOAGULATION WITH LASER Left 08/18/2019   Procedure: PHOTOCOAGULATION WITH LASER;  Surgeon: Hayden Pedro, MD;  Location: [facility];  Service: Ophthalmology;  Laterality: Left;  THYROID SURGERY     TONSILLECTOMY     vocal cord biopsy  11/12/14   squamous cell carcinoma in situ   Family History  Problem Relation Age of Onset   Colon cancer Father    Cancer Sister        Died of metastatic cancer   Leukemia Neg Hx     Social History   Tobacco Use   Smoking status: Former    Packs/day: 2.00    Years: 51.00    Pack years: 102.00    Types: Cigarettes    Start date: 11    Quit date: 03/05/2004    Years since quitting: 17.8   Smokeless  tobacco: Never  Substance Use Topics   Alcohol use: Yes    Alcohol/week: 3.0 standard drinks    Types: 3 Glasses of wine per week    Comment: occ   Marital Status: Married  ROS  Review of Systems  Cardiovascular:  Positive for dyspnea on exertion and leg swelling. Negative for chest pain.  Gastrointestinal:  Negative for melena.  Objective  Blood pressure 107/62, pulse 75, temperature 98.2 F (36.8 C), temperature source Temporal, resp. rate 17, height 5\' 10"  (1.778 m), weight 213 lb 3.2 oz (96.7 kg), SpO2 93 %.  Vitals with BMI 12/22/2021 12/13/2021 12/11/2021  Height 5\' 10"  5\' 10"  -  Weight 213 lbs 3 oz 214 lbs 13 oz -  BMI 19.14 78.29 -  Systolic 562 130 865  Diastolic 62 66 55  Pulse 75 82 84     Physical Exam Neck:     Vascular: No JVD.  Cardiovascular:     Rate and Rhythm: Normal rate and regular rhythm.     Pulses: Intact distal pulses.          Carotid pulses are 2+ on the right side and 2+ on the left side.      Femoral pulses are 2+ on the right side and 1+ on the left side.      Popliteal pulses are 2+ on the right side and 1+ on the left side.       Dorsalis pedis pulses are 2+ on the right side and 1+ on the left side.       Posterior tibial pulses are 1+ on the right side and 0 on the left side.     Heart sounds: Normal heart sounds. No murmur heard.   No gallop.  Pulmonary:     Effort: Pulmonary effort is normal.     Breath sounds: Rales (bibasilar, R>L) present.  Abdominal:     General: Bowel sounds are normal.     Palpations: Abdomen is soft.  Musculoskeletal:     Right lower leg: Edema (1-2 +) present.   Laboratory examination:   Recent Labs    11/13/21 0914 11/22/21 1117 12/04/21 1001 12/11/21 0944  NA 142 140 140 139  K 3.7 4.1 3.6 3.6  CL 106 104 104 104  CO2 28 26 28 28   GLUCOSE 130* 115* 158* 125*  BUN 12 8 11 11   CREATININE 1.10 1.02 1.00 1.03  CALCIUM 8.7* 8.0* 8.6* 8.5*  GFRNONAA >60  --  >60 >60   estimated creatinine clearance is  63.4 mL/min (by C-G formula based on SCr of 1.03 mg/dL).  CMP Latest Ref Rng & Units 12/11/2021 12/04/2021 11/22/2021  Glucose 70 - 99 mg/dL 125(H) 158(H) 115(H)  BUN 8 - 23 mg/dL 11 11 8   Creatinine 0.61 - 1.24 mg/dL 1.03 1.00 1.02  Sodium 135 - 145 mmol/L 139 140 140  Potassium 3.5 - 5.1 mmol/L 3.6 3.6 4.1  Chloride 98 - 111 mmol/L 104 104 104  CO2 22 - 32 mmol/L 28 28 26   Calcium 8.9 - 10.3 mg/dL 8.5(L) 8.6(L) 8.0(L)  Total Protein 6.5 - 8.1 g/dL 5.3(L) 5.5(L) -  Total Bilirubin 0.3 - 1.2 mg/dL 0.7 0.7 -  Alkaline Phos 38 - 126 U/L 71 57 -  AST 15 - 41 U/L 9(L) 9(L) -  ALT 0 - 44 U/L 7 13 -   CBC Latest Ref Rng & Units 12/11/2021 12/04/2021 11/22/2021  WBC 4.0 - 10.5 K/uL 2.1(L) 1.2(L) 3.7  Hemoglobin 13.0 - 17.0 g/dL 9.4(L) 9.6(L) 11.2(L)  Hematocrit 39.0 - 52.0 % 30.2(L) 30.7(L) 33.9(L)  Platelets 150 - 400 K/uL 122(L) 104(L) 77(LL)    Lipid Panel Recent Labs    03/23/21 0000  CHOL 157  TRIG 132  LDLCALC 99  HDL 34*     HEMOGLOBIN A1C No results found for: HGBA1C, MPG TSH Recent Labs    06/06/21 1402  TSH 0.12*    External labs:   Labs 12/05/2020:  Hb 12.2/HCT 36.9, WBC 3.3, platelets 94. Microcytic indicis.  Sodium 142, potassium 4.0, BUN 25, creatinine 0.91, CMP otherwise normal. Serum glucose 105 mg.  Labs 11/09/2020:  TSH normal.  Medications and allergies   Allergies  Allergen Reactions   Augmentin [Amoxicillin-Pot Clavulanate] Diarrhea    SOB     Current Meds  Medication Sig   acetaminophen (TYLENOL) 500 MG tablet Take 1,000 mg by mouth every 6 (six) hours as needed for moderate pain.   AMBULATORY NON FORMULARY MEDICATION Premier Protein Vanilla   apixaban (ELIQUIS) 2.5 MG TABS tablet Take 1 tablet (2.5 mg total) by mouth 2 (two) times daily.   doxazosin (CARDURA) 2 MG tablet Take 1 tablet (2 mg total) by mouth at bedtime.   gabapentin (NEURONTIN) 300 MG capsule Take 2 capsules (600 mg total) by mouth 3 (three) times daily.    HYDROcodone-acetaminophen (NORCO/VICODIN) 5-325 MG tablet Take 1 tablet by mouth every 6 (six) hours as needed for moderate pain.   ipratropium (ATROVENT) 0.06 % nasal spray Place 1 spray into both nostrils 2 (two) times daily as needed for rhinitis.   ketoconazole (NIZORAL) 2 % shampoo Apply 1 application topically 2 (two) times a week.   levothyroxine (SYNTHROID) 150 MCG tablet Take 1 tablet (150 mcg total) by mouth daily.   Lidocaine 4 % PTCH Apply 0.5 patches topically daily as needed (pain). Uses 1/2 patch daily.   lidocaine-prilocaine (EMLA) cream Apply 1 application topically as needed for up to 30 doses. (Patient taking differently: Apply 1 application topically daily as needed (port access).)   loratadine (CLARITIN) 10 MG tablet Take 10 mg by mouth daily as needed (allergies). Allertec   Melatonin 5 MG CHEW Chew 5 mg by mouth at bedtime.   omeprazole (PRILOSEC) 20 MG capsule Take 1 capsule by mouth as needed.   Polyethyl Glycol-Propyl Glycol (DRY EYE RELIEF) 0.4-0.3 % GEL ophthalmic gel 1 application 4 (four) times daily as needed (dry eyes).   potassium chloride (MICRO-K) 10 MEQ CR capsule Take 10 mEq by mouth in the morning and at bedtime.   propranolol ER (INDERAL LA) 80 MG 24 hr capsule TAKE ONE CAPSULE BY MOUTH ONE TIME DAILY   simvastatin (ZOCOR) 40 MG tablet TAKE ONE TABLET BY MOUTH DAILY AT BEDTIME (Patient taking differently: Take 40 mg by mouth daily at 6 PM.)   venetoclax  100 MG TABS Take 400 mg by mouth daily. Takes 400 mg daily for 1 weeks and stops for 5 weeks.   Radiology:   MRI of the brain 09/06/2020: Chronic microvascular ischemic disease. Remote lacunar infarct left cerebellar hemisphere.  CTA Head & Neck 09/16/2020: 1.  No evidence of acute intracranial abnormality. However, CT is relatively insensitive for the detection of acute infarct within the first 24-48 hours, and MRI may be indicated if there is high clinical suspicion.  2.  Multifocal high-grade stenosis  versus occlusion of the nondominant right vertebral artery, which although likely chronic could be related to the reported complaint of dizziness. Consider neurovascular referral for further assessment.  3.  Lobulated soft tissue density lesion interposed between the right levator scapula and trapezius muscles measuring up to 2.2 cm, which is favored to represent a venous malformation with associated phlebolith and could be further evaluated with ultrasound.   Aortic arch: No significant stenosis of the origins of the major arch vessels.  Left carotid system: Atherosclerotic calcification at the common carotid artery bifurcation without evidence of significant (50% or greater) stenosis or occlusion.  Right carotid system: Atherosclerotic calcification at the common carotid artery bifurcation without evidence of significant (50% or greater) stenosis or occlusion.  Vertebral arteries: Left dominant. Multifocal high-grade stenosis versus occlusion, including areas of nonopacification of the V2, V3, and V4 segments, of the nondominant right vertebral artery. Mild to moderate atherosclerotic narrowing at the origin of the left vertebral artery without substantial narrowing distally.   Cardiac Studies:   Exercise Treadmill Stress Test 05/26/2018: Indication: CP  The patient exercised on Bruce protocol for 6:00 min. Patient achieved 7.05 METS and reached HR 107 bpm, which is 76% of maximum age-predicted HR. Stress test terminated due to Fatigue.  Exercise capacity was below average for age. HR Response to Exercise: Attenuated secondary to medication. BP Response to Exercise: Normal resting BP- exaggerated response. Peak BP 200/80 mmHg Chest Pain: none. Arrhythmias: none. ST Changes: With peak exercise there was no ST-T changes of ischemia.  Overall Impression: Inadequate subotpimal stress test Consider further cardiac evaluation including cardiac catheterization if clinically indicated.  ABD aortic  duplex 10/16/2018: Mild ectasia involving mid and distal aorta. Normal iliac artery velocity. Mixed plaque noted.  LE Doppler 10/16/2018: Indication: Bilateral common iliac artery stent and left external iliac artery stent by Dr. Denyce Robert Moderate velocity increase at the right posterior tibial artery suggestive of > 50% stenosis. Moderate velocity increase at the left distal superficial femoral and posterior tibial arteries suggestive of >50% stenosis. Normal triphasic waveforms of both ankles. Mildly decreased bilateral resting ABI at 0.93.  Carotid artery duplex 05/04/2020: Right Carotid: Velocities in the right ICA are consistent with a 1-39% stenosis. Non-hemodynamically significant plaque <50% noted in the CCA. Left Carotid: Velocities in the left ICA are consistent with a 1-39% stenosis. Non-hemodynamically significant plaque <50% noted in the CCA. Vertebrals:  Left vertebral artery demonstrates antegrade flow. Right vertebral artery demonstrates high resistant flow.  Minimum flow noted in the vertebral artery. High resistant waveform is evident suggesting  distal occlusion vs high grade stenosis. Subclavians: Right subclavian artery flow was disturbed. Normal flow hemodynamics were seen in the left subclavian artery.  Echocardiogram 07/17/2019:  Left ventricle cavity is normal in size. Mild concentric hypertrophy of the left ventricle. Normal LV systolic function with EF 57%. Normal global wall motion. Doppler evidence of grade I (impaired) diastolic dysfunction, normal LAP.  Moderate (Grade II) mitral regurgitation. Inadequate TR jet to  estimate pulmonary artery systolic pressure. Normal right atrial pressure.  IVC filter placement 03/17/2021: Successful placement of Bard Denali IVC filter.  The filter is potentially retrievable. Successful retrieval of the [place] IVC filter via right IJ access on 11/28/2021.  EKG:   EKG 09/08/2021: Normal sinus rhythm heart rate 81 bpm, left  axis deviation, left anterior fascicular block.  Low-voltage complexes.  No significant change from 12/05/2020.   Assessment   1.  SP IVC filter, Bard Denali retrievable filter placed 03/17/2021 S/P retreival on 11/28/2021 2.  Peripheral arterial disease with history of bilateral iliac artery stents without recurrence of claudication. 3.  Hypercholesterolemia  Recommendations:   TAVIUS TURGEON is a 86 y.o. 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  He had right leg DVT and subsegmental pulm embolism and had IVC filter placed 03/17/2021, underwent successful removal on 12/05/2020.  Eliquis has been continued as per hematology.  He now presents for follow-up.  From cardiac standpoint he is presently doing well and essentially remains asymptomatic except he has noticed marked dyspnea on exertion.  Recently chest x-ray and CT scan reviewed.     His lipids are being managed by his PCP, goal LDL <70.  Previous LDL has been around 99 and I would like to start him on Zetia 10 mg daily.  Been diagnosed with multiple pulmonary lesions and scheduled for bronchoscopy as well.  And some light weight upon starting new medications for lipids.  This can be performed by his PCP as well.  I discussed this with patient and his wife at the bedside.  He can follow-up with his PCP for further management and evaluation of his lipid status and hypertension, otherwise he does not have any significant active ongoing cardiovascular issues hence I will see him back on a as needed basis.     [name], MD, [facility] 12/23/2021, 8:21 AM Office: [phone]

## 2021-12-22 NOTE — Telephone Encounter (Signed)
Received call from [facility] at [facility]. She stated,"we are going to delay his next cycle of treatment until he has a bronchoscopy. We will call you when we know his next appointment."

## 2021-12-25 ENCOUNTER — Inpatient Hospital Stay: Payer: Medicare Other

## 2021-12-25 ENCOUNTER — Inpatient Hospital Stay: Payer: Medicare Other | Admitting: Hematology & Oncology

## 2021-12-26 ENCOUNTER — Ambulatory Visit: Payer: Medicare Other

## 2021-12-27 ENCOUNTER — Telehealth: Payer: Self-pay | Admitting: *Deleted

## 2021-12-27 ENCOUNTER — Ambulatory Visit: Payer: Medicare Other

## 2021-12-27 NOTE — Telephone Encounter (Signed)
Received orders from [facility] for patient to start treatment/lab on 2.27.23.  confirmed that a Scheduling message was sent yesterday for this to be set up

## 2021-12-28 ENCOUNTER — Encounter (INDEPENDENT_AMBULATORY_CARE_PROVIDER_SITE_OTHER): Payer: Medicare Other | Admitting: Ophthalmology

## 2021-12-28 ENCOUNTER — Ambulatory Visit: Payer: Medicare Other

## 2021-12-29 ENCOUNTER — Ambulatory Visit: Payer: Medicare Other

## 2022-01-01 ENCOUNTER — Other Ambulatory Visit: Payer: Self-pay

## 2022-01-01 ENCOUNTER — Inpatient Hospital Stay: Payer: Medicare Other

## 2022-01-01 ENCOUNTER — Inpatient Hospital Stay (HOSPITAL_BASED_OUTPATIENT_CLINIC_OR_DEPARTMENT_OTHER): Payer: Medicare Other | Admitting: Hematology & Oncology

## 2022-01-01 ENCOUNTER — Encounter (INDEPENDENT_AMBULATORY_CARE_PROVIDER_SITE_OTHER): Payer: Medicare Other | Admitting: Ophthalmology

## 2022-01-01 ENCOUNTER — Encounter: Payer: Self-pay | Admitting: Hematology & Oncology

## 2022-01-01 VITALS — BP 123/48 | HR 77 | Temp 98.5°F | Resp 20

## 2022-01-01 VITALS — BP 119/45 | HR 74 | Temp 98.0°F | Resp 20 | Wt 215.0 lb

## 2022-01-01 VITALS — BP 119/45 | HR 74 | Temp 98.0°F | Resp 17

## 2022-01-01 DIAGNOSIS — C9501 Acute leukemia of unspecified cell type, in remission: Secondary | ICD-10-CM | POA: Diagnosis not present

## 2022-01-01 DIAGNOSIS — C9500 Acute leukemia of unspecified cell type not having achieved remission: Secondary | ICD-10-CM

## 2022-01-01 LAB — CMP (CANCER CENTER ONLY)
ALT: 6 U/L (ref 0–44)
AST: 9 U/L — ABNORMAL LOW (ref 15–41)
Albumin: 3.4 g/dL — ABNORMAL LOW (ref 3.5–5.0)
Alkaline Phosphatase: 61 U/L (ref 38–126)
Anion gap: 7 (ref 5–15)
BUN: 15 mg/dL (ref 8–23)
CO2: 26 mmol/L (ref 22–32)
Calcium: 7.9 mg/dL — ABNORMAL LOW (ref 8.9–10.3)
Chloride: 108 mmol/L (ref 98–111)
Creatinine: 1.08 mg/dL (ref 0.61–1.24)
GFR, Estimated: >60 mL/min (ref >60)
Glucose, Bld: 100 mg/dL — ABNORMAL HIGH (ref 70–99)
Potassium: 3.8 mmol/L (ref 3.5–5.1)
Sodium: 141 mmol/L (ref 135–145)
Total Bilirubin: 0.6 mg/dL (ref 0.3–1.2)
Total Protein: 5.1 g/dL — ABNORMAL LOW (ref 6.5–8.1)

## 2022-01-01 LAB — RETICULOCYTES
Immature Retic Fract: 20.4 % — ABNORMAL HIGH (ref 2.3–15.9)
RBC.: 3.1 MIL/uL — ABNORMAL LOW (ref 4.22–5.81)
Retic Count, Absolute: 58.9 10*3/uL (ref 19.0–186.0)
Retic Ct Pct: 1.9 % (ref 0.4–3.1)

## 2022-01-01 LAB — IRON AND IRON BINDING CAPACITY (CC-WL,HP ONLY)
Iron: 79 ug/dL (ref 45–182)
Saturation Ratios: 32 % (ref 17.9–39.5)
TIBC: 251 ug/dL (ref 250–450)
UIBC: 172 ug/dL (ref 117–376)

## 2022-01-01 LAB — CBC WITH DIFFERENTIAL (CANCER CENTER ONLY)
Abs Immature Granulocytes: 0 10*3/uL (ref 0.00–0.07)
Band Neutrophils: 3 %
Basophils Absolute: 0 10*3/uL (ref 0.0–0.1)
Basophils Relative: 2 %
Eosinophils Absolute: 0.1 10*3/uL (ref 0.0–0.5)
Eosinophils Relative: 5 %
HCT: 29.7 % — ABNORMAL LOW (ref 39.0–52.0)
Hemoglobin: 9.2 g/dL — ABNORMAL LOW (ref 13.0–17.0)
Lymphocytes Relative: 38 %
Lymphs Abs: 0.9 10*3/uL (ref 0.7–4.0)
MCH: 29.3 pg (ref 26.0–34.0)
MCHC: 31 g/dL (ref 30.0–36.0)
MCV: 94.6 fL (ref 80.0–100.0)
Monocytes Absolute: 0.1 10*3/uL (ref 0.1–1.0)
Monocytes Relative: 6 %
Neutro Abs: 1.1 10*3/uL — ABNORMAL LOW (ref 1.7–7.7)
Neutrophils Relative %: 44 %
Other: 2 %
Platelet Count: 72 10*3/uL — ABNORMAL LOW (ref 150–400)
RBC: 3.14 MIL/uL — ABNORMAL LOW (ref 4.22–5.81)
RDW: 20.6 % — ABNORMAL HIGH (ref 11.5–15.5)
WBC Count: 2.4 10*3/uL — ABNORMAL LOW (ref 4.0–10.5)
nRBC: 0 % (ref 0.0–0.2)

## 2022-01-01 LAB — LACTATE DEHYDROGENASE: LDH: 167 U/L (ref 98–192)

## 2022-01-01 LAB — MAGNESIUM: Magnesium: 2.2 mg/dL (ref 1.7–2.4)

## 2022-01-01 LAB — SAVE SMEAR(SSMR), FOR PROVIDER SLIDE REVIEW

## 2022-01-01 MED ORDER — SODIUM CHLORIDE 0.9 % IV SOLN
10.0000 mg | Freq: Once | INTRAVENOUS | Status: AC
  Administered 2022-01-01: 10 mg via INTRAVENOUS
  Filled 2022-01-01: qty 10

## 2022-01-01 MED ORDER — HEPARIN SOD (PORK) LOCK FLUSH 100 UNIT/ML IV SOLN
500.0000 [IU] | Freq: Once | INTRAVENOUS | Status: AC | PRN
  Administered 2022-01-01: 500 [IU]

## 2022-01-01 MED ORDER — PALONOSETRON HCL INJECTION 0.25 MG/5ML
0.2500 mg | Freq: Once | INTRAVENOUS | Status: AC
  Administered 2022-01-01: 0.25 mg via INTRAVENOUS
  Filled 2022-01-01: qty 5

## 2022-01-01 MED ORDER — SODIUM CHLORIDE 0.9% FLUSH
10.0000 mL | INTRAVENOUS | Status: AC | PRN
  Administered 2022-01-01: 10 mL via INTRAVENOUS

## 2022-01-01 MED ORDER — SODIUM CHLORIDE 0.9 % IV SOLN
49.5000 mg/m2 | Freq: Once | INTRAVENOUS | Status: AC
  Administered 2022-01-01: 100 mg via INTRAVENOUS
  Filled 2022-01-01: qty 10

## 2022-01-01 MED ORDER — SODIUM CHLORIDE 0.9 % IV SOLN: Freq: Once | INTRAVENOUS | Status: AC

## 2022-01-01 MED ORDER — HEPARIN SOD (PORK) LOCK FLUSH 100 UNIT/ML IV SOLN
500.0000 [IU] | Freq: Once | INTRAVENOUS | Status: AC
  Administered 2022-01-01: 500 [IU] via INTRAVENOUS

## 2022-01-01 MED ORDER — SODIUM CHLORIDE 0.9% FLUSH
10.0000 mL | INTRAVENOUS | Status: DC | PRN
  Administered 2022-01-01: 10 mL

## 2022-01-01 NOTE — Progress Notes (Signed)
Hematology and Oncology Follow Up Visit  Victor [name] [id number] [birth year] 86 y.o. 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 twice daily --started on 03/16/2021     Interim History:  Victor [name] is back for follow-up.  He has been quite busy.  Unfortunately, we found that he had a pulmonary issue.  Few weeks ago, he noted that he was having some shortness of breath.  We did go ahead and get a chest x-ray.  The chest x-ray showed there was a cavitary mass In the left lower lobe.  It  measured 5.9 x 3.7 cm.  A CT scan was then done.  This was done on 12/18/2021.  This showed multiple nodular masslike lesions in the lung.  The largest was in the left upper lobe measuring 5.7 x 3.2 cm.  It was macrolobulated and spiculated.  There is several satellite areas of nodularity.  Everything else looks fine.  He was seen by Dr. Linus Orn at [facility] for his regular appointment.  At the party looked at the scans.  He then referred Victor [name] to Pulmonary Medicine.  He then underwent a bronchoscopy on 12/29/2021.  The results not back yet.  Multiple specimens were taken.  Is not clear what we are looking at here hopefully this is not a malignancy.  It certainly looks like it could be a infectious process, possibly fungal infection.  He says he is doing okay.  His breathing is doing little bit better.  He does feel little tired.  He has had no cough or hemoptysis.  He has had no change in bowel or bladder habits.  He has had no issues with nausea or vomiting.  He has had no rashes.  There is been no leg swelling.  Overall, I would say his performance status is probably ECOG 1.      Medications:  Current Outpatient Medications:    acetaminophen (TYLENOL) 500 MG tablet, Take 1,000 mg by mouth every 6 (six) hours as needed for moderate  pain., Disp: , Rfl:    AMBULATORY NON FORMULARY MEDICATION, Premier Protein Vanilla, Disp: 12 Bottle, Rfl: 2   doxazosin (CARDURA) 2 MG tablet, Take 1 tablet (2 mg total) by mouth at bedtime., Disp: 90 tablet, Rfl: 3   gabapentin (NEURONTIN) 300 MG capsule, Take 2 capsules (600 mg total) by mouth 3 (three) times daily., Disp: 240 capsule, Rfl: 4   HYDROcodone-acetaminophen (NORCO/VICODIN) 5-325 MG tablet, Take 1 tablet by mouth every 6 (six) hours as needed for moderate pain., Disp: , Rfl:    ipratropium (ATROVENT) 0.06 % nasal spray, Place 1 spray into both nostrils 2 (two) times daily as needed for rhinitis., Disp: , Rfl:    ketoconazole (NIZORAL) 2 % shampoo, Apply 1 application topically 2 (two) times a week., Disp: , Rfl:    levothyroxine (SYNTHROID) 150 MCG tablet, Take 1 tablet (150 mcg total) by mouth daily., Disp: 90 tablet, Rfl: 3   Lidocaine 4 % PTCH, Apply 0.5 patches topically daily as needed (pain). Uses 1/2 patch daily., Disp: , Rfl:    lidocaine-prilocaine (EMLA) cream, Apply 1 application topically as needed for up to 30 doses. (Patient taking differently: Apply 1 application topically daily as needed (port access).), Disp: 30 g, Rfl: 0   loratadine (CLARITIN) 10 MG tablet, Take 10 mg by mouth daily as needed (allergies). Allertec, Disp: ,  Rfl:    Melatonin 5 MG CHEW, Chew 5 mg by mouth at bedtime., Disp: , Rfl:    omeprazole (PRILOSEC) 20 MG capsule, Take 1 capsule by mouth as needed., Disp: , Rfl:    Polyethyl Glycol-Propyl Glycol (DRY EYE RELIEF) 0.4-0.3 % GEL ophthalmic gel, 1 application 4 (four) times daily as needed (dry eyes)., Disp: , Rfl:    potassium chloride (MICRO-K) 10 MEQ CR capsule, Take 10 mEq by mouth., Disp: , Rfl:    propranolol ER (INDERAL LA) 80 MG 24 hr capsule, TAKE ONE CAPSULE BY MOUTH ONE TIME DAILY, Disp: 90 capsule, Rfl: 0   simvastatin (ZOCOR) 40 MG tablet, TAKE ONE TABLET BY MOUTH DAILY AT BEDTIME (Patient taking differently: Take 40 mg by mouth daily at  6 PM.), Disp: 90 tablet, Rfl: 3   venetoclax 100 MG TABS, Take 400 mg by mouth daily. Takes 400 mg daily for 1 weeks and stops for 5 weeks., Disp: , Rfl:    apixaban (ELIQUIS) 2.5 MG TABS tablet, Take 1 tablet (2.5 mg total) by mouth 2 (two) times daily. (Patient not taking: Reported on 01/01/2022), Disp: 60 tablet, Rfl: 6 No current facility-administered medications for this visit.  Facility-Administered Medications Ordered in Other Visits:    sodium chloride flush (NS) 0.9 % injection 10 mL, 10 mL, Intravenous, PRN, Volanda Napoleon, MD, 10 mL at 01/01/22 1120  Allergies:  Allergies  Allergen Reactions   Augmentin [Amoxicillin-Pot Clavulanate] Diarrhea    SOB    Past Medical History, Surgical history, Social history, and Family History were reviewed and updated.  Review of Systems: Review of Systems  Constitutional: Negative.   HENT:  Negative.    Eyes: Negative.   Respiratory: Negative.    Cardiovascular: Negative.   Gastrointestinal: Negative.   Endocrine: Negative.   Genitourinary: Negative.    Musculoskeletal: Negative.   Skin: Negative.   Neurological: Negative.   Hematological: Negative.   Psychiatric/Behavioral: Negative.     Physical Exam:  weight is 215 lb (97.5 kg). His oral temperature is 98 F (36.7 C). His blood pressure is 119/45 (abnormal) and his pulse is 74. His respiration is 20 and oxygen saturation is 95%.   Wt Readings from Last 3 Encounters:  01/01/22 215 lb (97.5 kg)  12/22/21 213 lb 3.2 oz (96.7 kg)  12/13/21 214 lb 12.8 oz (97.4 kg)    Physical Exam Vitals reviewed.  HENT:     Head: Normocephalic and atraumatic.  Eyes:     Pupils: Pupils are equal, round, and reactive to light.  Cardiovascular:     Rate and Rhythm: Normal rate and regular rhythm.     Heart sounds: Normal heart sounds.  Pulmonary:     Effort: Pulmonary effort is normal.     Breath sounds: Normal breath sounds.  Abdominal:     General: Bowel sounds are normal.      Palpations: Abdomen is soft.  Musculoskeletal:        General: No tenderness or deformity. Normal range of motion.     Cervical back: Normal range of motion.  Lymphadenopathy:     Cervical: No cervical adenopathy.  Skin:    General: Skin is warm and dry.     Findings: No erythema or rash.  Neurological:     Mental Status: He is alert and oriented to person, place, and time.  Psychiatric:        Behavior: Behavior normal.        Thought Content: Thought content normal.  Judgment: Judgment normal.     Lab Results  Component Value Date   WBC 2.4 (L) 01/01/2022   HGB 9.2 (L) 01/01/2022   HCT 29.7 (L) 01/01/2022   MCV 94.6 01/01/2022   PLT 72 (L) 01/01/2022     Chemistry      Component Value Date/Time   NA 141 01/01/2022 1004   NA 140 11/22/2021 1117   NA 142 03/30/2015 1149   K 3.8 01/01/2022 1004   K 4.5 03/30/2015 1149   CL 108 01/01/2022 1004   CO2 26 01/01/2022 1004   CO2 25 03/30/2015 1149   BUN 15 01/01/2022 1004   BUN 8 11/22/2021 1117   BUN 17.1 03/30/2015 1149   CREATININE 1.08 01/01/2022 1004   CREATININE 1.2 03/30/2015 1149      Component Value Date/Time   CALCIUM 7.9 (L) 01/01/2022 1004   CALCIUM 8.7 03/30/2015 1149   ALKPHOS 61 01/01/2022 1004   AST 9 (L) 01/01/2022 1004   ALT 6 01/01/2022 1004   BILITOT 0.6 01/01/2022 1004       Impression and Plan: Mr. Loya is a very nice 86 year old white male who has acute myeloid leukemia.  He does have a adverse prognostic feature with respect to the FLT3 mutation.    The issue right now is what is in his lungs.  Hopefully, this is some that we will be able to take care of without having any kind of surgery.  It will be interesting to find out what the bronchoscopy shows.  We will go ahead with his treatment.  I spoke with Dr. Randie Heinz.  He thought would be okay for him to have treatment.  I looked at his blood smear, I thought there may have been 1 or 2 immature myeloid cells.  It is hard to know  what this really means.  We are certainly going to have to be wary of this.  I am sure we will hear from Dr. Randie Heinz at [facility] about the bronchoscopy results.  Overall, I think Mr. Isabell has been doing quite nicely.  I realize he has had some immunosuppression from the chemotherapy.  We had him off the Eliquis for right now.  I think it is okay for him to be off Eliquis until we know if any further invasive studies need to be done.  We will have to watch him closely.  We will plan to have his lab work checked weekly for right now.    I will plan to see him back in another 6 weeks.  We concern get him back sooner if there are problems.     Volanda Napoleon, MD 2/27/20231:48 PM

## 2022-01-01 NOTE — Addendum Note (Signed)
Addended by: Randolm Idol on: 01/01/2022 11:20 AM   Modules accepted: Orders

## 2022-01-01 NOTE — Progress Notes (Signed)
Reviewed pt labs with Dr. [name] and pt ok to treat with platelets 72 and ANC 1.1

## 2022-01-01 NOTE — Patient Instructions (Signed)
[facility] [place]  Discharge Instructions: Thank you for choosing [facility] to provide your oncology and hematology care.   If you have a lab appointment with the [facility], please go directly to the [facility] and check in at the registration area.  Wear comfortable clothing and clothing appropriate for easy access to any Portacath or PICC line.   We strive to give you quality time with your provider. You may need to reschedule your appointment if you arrive late (15 or more minutes).  Arriving late affects you and other patients whose appointments are after yours.  Also, if you miss three or more appointments without notifying the office, you may be dismissed from the clinic at the providers discretion.      For prescription refill requests, have your pharmacy contact our office and allow 72 hours for refills to be completed.    Today you received the following chemotherapy and/or immunotherapy agents Vidaza      To help prevent nausea and vomiting after your treatment, we encourage you to take your nausea medication as directed.  BELOW ARE SYMPTOMS THAT SHOULD BE REPORTED IMMEDIATELY: *FEVER GREATER THAN 100.4 F (38 C) OR HIGHER *CHILLS OR SWEATING *NAUSEA AND VOMITING THAT IS NOT CONTROLLED WITH YOUR NAUSEA MEDICATION *UNUSUAL SHORTNESS OF BREATH *UNUSUAL BRUISING OR BLEEDING *URINARY PROBLEMS (pain or burning when urinating, or frequent urination) *BOWEL PROBLEMS (unusual diarrhea, constipation, pain near the anus) TENDERNESS IN MOUTH AND THROAT WITH OR WITHOUT PRESENCE OF ULCERS (sore throat, sores in mouth, or a toothache) UNUSUAL RASH, SWELLING OR PAIN  UNUSUAL VAGINAL DISCHARGE OR ITCHING   Items with * indicate a potential emergency and should be followed up as soon as possible or go to the Emergency Department if any problems should occur.  Please show the CHEMOTHERAPY ALERT CARD or IMMUNOTHERAPY ALERT CARD at check-in to the  Emergency Department and triage nurse. Should you have questions after your visit or need to cancel or reschedule your appointment, please contact [facility]  [phone] and follow the prompts.  Office hours are 8:00 a.m. to 4:30 p.m. Monday - Friday. Please note that voicemails left after 4:00 p.m. may not be returned until the following business day.  We are closed weekends and major holidays. You have access to a nurse at all times for urgent questions. Please call the main number to the clinic [phone] and follow the prompts.  For any non-urgent questions, you may also contact your provider using MyChart. We now offer e-Visits for anyone 40 and older to request care online for non-urgent symptoms. For details visit [email or web address].   Also download the MyChart app! Go to the app store, search "MyChart", open the app, select [facility], and log in with your MyChart username and password.  Due to Covid, a mask is required upon entering the hospital/clinic. If you do not have a mask, one will be given to you upon arrival. For doctor visits, patients may have 1 support person aged 63 or older with them. For treatment visits, patients cannot have anyone with them due to current Covid guidelines and our immunocompromised population.

## 2022-01-02 ENCOUNTER — Inpatient Hospital Stay: Payer: Medicare Other

## 2022-01-02 VITALS — BP 123/49 | HR 80 | Temp 98.0°F | Resp 18

## 2022-01-02 DIAGNOSIS — C9500 Acute leukemia of unspecified cell type not having achieved remission: Secondary | ICD-10-CM | POA: Diagnosis not present

## 2022-01-02 LAB — FERRITIN: Ferritin: 298 ng/mL (ref 24–336)

## 2022-01-02 MED ORDER — SODIUM CHLORIDE 0.9 % IV SOLN: Freq: Once | INTRAVENOUS | Status: AC

## 2022-01-02 MED ORDER — SODIUM CHLORIDE 0.9 % IV SOLN
49.5000 mg/m2 | Freq: Once | INTRAVENOUS | Status: AC
  Administered 2022-01-02: 100 mg via INTRAVENOUS
  Filled 2022-01-02: qty 10

## 2022-01-02 MED ORDER — HEPARIN SOD (PORK) LOCK FLUSH 100 UNIT/ML IV SOLN
500.0000 [IU] | Freq: Once | INTRAVENOUS | Status: AC | PRN
  Administered 2022-01-02: 500 [IU]

## 2022-01-02 MED ORDER — SODIUM CHLORIDE 0.9 % IV SOLN
10.0000 mg | Freq: Once | INTRAVENOUS | Status: AC
  Administered 2022-01-02: 10 mg via INTRAVENOUS
  Filled 2022-01-02: qty 10

## 2022-01-02 MED ORDER — SODIUM CHLORIDE 0.9% FLUSH
10.0000 mL | INTRAVENOUS | Status: DC | PRN
  Administered 2022-01-02: 10 mL

## 2022-01-02 NOTE — Patient Instructions (Signed)
[facility] [place]  Discharge Instructions: Thank you for choosing [facility] to provide your oncology and hematology care.   If you have a lab appointment with the [facility], please go directly to the [facility] and check in at the registration area.  Wear comfortable clothing and clothing appropriate for easy access to any Portacath or PICC line.   We strive to give you quality time with your provider. You may need to reschedule your appointment if you arrive late (15 or more minutes).  Arriving late affects you and other patients whose appointments are after yours.  Also, if you miss three or more appointments without notifying the office, you may be dismissed from the clinic at the providers discretion.      For prescription refill requests, have your pharmacy contact our office and allow 72 hours for refills to be completed.    Today you received the following chemotherapy and/or immunotherapy agents vidaza   To help prevent nausea and vomiting after your treatment, we encourage you to take your nausea medication as directed.  BELOW ARE SYMPTOMS THAT SHOULD BE REPORTED IMMEDIATELY: *FEVER GREATER THAN 100.4 F (38 C) OR HIGHER *CHILLS OR SWEATING *NAUSEA AND VOMITING THAT IS NOT CONTROLLED WITH YOUR NAUSEA MEDICATION *UNUSUAL SHORTNESS OF BREATH *UNUSUAL BRUISING OR BLEEDING *URINARY PROBLEMS (pain or burning when urinating, or frequent urination) *BOWEL PROBLEMS (unusual diarrhea, constipation, pain near the anus) TENDERNESS IN MOUTH AND THROAT WITH OR WITHOUT PRESENCE OF ULCERS (sore throat, sores in mouth, or a toothache) UNUSUAL RASH, SWELLING OR PAIN  UNUSUAL VAGINAL DISCHARGE OR ITCHING   Items with * indicate a potential emergency and should be followed up as soon as possible or go to the Emergency Department if any problems should occur.  Please show the CHEMOTHERAPY ALERT CARD or IMMUNOTHERAPY ALERT CARD at check-in to the  Emergency Department and triage nurse. Should you have questions after your visit or need to cancel or reschedule your appointment, please contact [facility]  [phone] and follow the prompts.  Office hours are 8:00 a.m. to 4:30 p.m. Monday - Friday. Please note that voicemails left after 4:00 p.m. may not be returned until the following business day.  We are closed weekends and major holidays. You have access to a nurse at all times for urgent questions. Please call the main number to the clinic [phone] and follow the prompts.  For any non-urgent questions, you may also contact your provider using MyChart. We now offer e-Visits for anyone 84 and older to request care online for non-urgent symptoms. For details visit [email or web address].   Also download the MyChart app! Go to the app store, search "MyChart", open the app, select [facility], and log in with your MyChart username and password.  Due to Covid, a mask is required upon entering the hospital/clinic. If you do not have a mask, one will be given to you upon arrival. For doctor visits, patients may have 1 support person aged 84 or older with them. For treatment visits, patients cannot have anyone with them due to current Covid guidelines and our immunocompromised population.

## 2022-01-03 ENCOUNTER — Inpatient Hospital Stay: Payer: Medicare Other | Attending: Oncology

## 2022-01-03 ENCOUNTER — Other Ambulatory Visit: Payer: Self-pay

## 2022-01-03 VITALS — BP 137/49 | HR 65 | Temp 98.1°F | Resp 18

## 2022-01-03 DIAGNOSIS — Z79899 Other long term (current) drug therapy: Secondary | ICD-10-CM | POA: Insufficient documentation

## 2022-01-03 DIAGNOSIS — I2699 Other pulmonary embolism without acute cor pulmonale: Secondary | ICD-10-CM | POA: Diagnosis not present

## 2022-01-03 DIAGNOSIS — Z5111 Encounter for antineoplastic chemotherapy: Secondary | ICD-10-CM | POA: Insufficient documentation

## 2022-01-03 DIAGNOSIS — I82401 Acute embolism and thrombosis of unspecified deep veins of right lower extremity: Secondary | ICD-10-CM | POA: Insufficient documentation

## 2022-01-03 DIAGNOSIS — C9500 Acute leukemia of unspecified cell type not having achieved remission: Secondary | ICD-10-CM | POA: Insufficient documentation

## 2022-01-03 MED ORDER — SODIUM CHLORIDE 0.9% FLUSH
10.0000 mL | INTRAVENOUS | Status: DC | PRN
  Administered 2022-01-03: 10 mL

## 2022-01-03 MED ORDER — PALONOSETRON HCL INJECTION 0.25 MG/5ML
0.2500 mg | Freq: Once | INTRAVENOUS | Status: AC
  Administered 2022-01-03: 0.25 mg via INTRAVENOUS
  Filled 2022-01-03: qty 5

## 2022-01-03 MED ORDER — SODIUM CHLORIDE 0.9 % IV SOLN: Freq: Once | INTRAVENOUS | Status: AC

## 2022-01-03 MED ORDER — HEPARIN SOD (PORK) LOCK FLUSH 100 UNIT/ML IV SOLN
500.0000 [IU] | Freq: Once | INTRAVENOUS | Status: AC | PRN
  Administered 2022-01-03: 500 [IU]

## 2022-01-03 MED ORDER — SODIUM CHLORIDE 0.9 % IV SOLN
10.0000 mg | Freq: Once | INTRAVENOUS | Status: AC
  Administered 2022-01-03: 10 mg via INTRAVENOUS
  Filled 2022-01-03: qty 10

## 2022-01-03 MED ORDER — SODIUM CHLORIDE 0.9 % IV SOLN
49.5000 mg/m2 | Freq: Once | INTRAVENOUS | Status: AC
  Administered 2022-01-03: 100 mg via INTRAVENOUS
  Filled 2022-01-03: qty 10

## 2022-01-03 NOTE — Patient Instructions (Signed)
[facility] [place]  Discharge Instructions: ?Thank you for choosing [facility] to provide your oncology and hematology care.  ? ?If you have a lab appointment with the [facility], please go directly to the [facility] and check in at the registration area. ? ?Wear comfortable clothing and clothing appropriate for easy access to any Portacath or PICC line.  ? ?We strive to give you quality time with your provider. You may need to reschedule your appointment if you arrive late (15 or more minutes).  Arriving late affects you and other patients whose appointments are after yours.  Also, if you miss three or more appointments without notifying the office, you may be dismissed from the clinic at the provider?s discretion.    ?  ?For prescription refill requests, have your pharmacy contact our office and allow 72 hours for refills to be completed.   ? ?Today you received the following chemotherapy and/or immunotherapy agents:  Vidaza    ?  ?To help prevent nausea and vomiting after your treatment, we encourage you to take your nausea medication as directed. ? ?BELOW ARE SYMPTOMS THAT SHOULD BE REPORTED IMMEDIATELY: ?*FEVER GREATER THAN 100.4 F (38 ?C) OR HIGHER ?*CHILLS OR SWEATING ?*NAUSEA AND VOMITING THAT IS NOT CONTROLLED WITH YOUR NAUSEA MEDICATION ?*UNUSUAL SHORTNESS OF BREATH ?*UNUSUAL BRUISING OR BLEEDING ?*URINARY PROBLEMS (pain or burning when urinating, or frequent urination) ?*BOWEL PROBLEMS (unusual diarrhea, constipation, pain near the anus) ?TENDERNESS IN MOUTH AND THROAT WITH OR WITHOUT PRESENCE OF ULCERS (sore throat, sores in mouth, or a toothache) ?UNUSUAL RASH, SWELLING OR PAIN  ?UNUSUAL VAGINAL DISCHARGE OR ITCHING  ? ?Items with * indicate a potential emergency and should be followed up as soon as possible or go to the Emergency Department if any problems should occur. ? ?Please show the CHEMOTHERAPY ALERT CARD or IMMUNOTHERAPY ALERT CARD at check-in to the  Emergency Department and triage nurse. ?Should you have questions after your visit or need to cancel or reschedule your appointment, please contact [facility]  [phone] and follow the prompts.  Office hours are 8:00 a.m. to 4:30 p.m. Monday - Friday. Please note that voicemails left after 4:00 p.m. may not be returned until the following business day.  We are closed weekends and major holidays. You have access to a nurse at all times for urgent questions. Please call the main number to the clinic [phone] and follow the prompts. ? ?For any non-urgent questions, you may also contact your provider using MyChart. We now offer e-Visits for anyone 88 and older to request care online for non-urgent symptoms. For details visit [email or web address]. ?  ?Also download the MyChart app! Go to the app store, search "MyChart", open the app, select [facility], and log in with your MyChart username and password. ? ?Due to Covid, a mask is required upon entering the hospital/clinic. If you do not have a mask, one will be given to you upon arrival. For doctor visits, patients may have 1 support person aged 54 or older with them. For treatment visits, patients cannot have anyone with them due to current Covid guidelines and our immunocompromised population.  ?

## 2022-01-04 ENCOUNTER — Inpatient Hospital Stay: Payer: Medicare Other

## 2022-01-04 VITALS — BP 120/60 | HR 67 | Temp 97.8°F | Resp 16

## 2022-01-04 DIAGNOSIS — C9500 Acute leukemia of unspecified cell type not having achieved remission: Secondary | ICD-10-CM | POA: Diagnosis not present

## 2022-01-04 LAB — PATHOLOGIST SMEAR REVIEW

## 2022-01-04 MED ORDER — SODIUM CHLORIDE 0.9 % IV SOLN
10.0000 mg | Freq: Once | INTRAVENOUS | Status: AC
  Administered 2022-01-04: 10 mg via INTRAVENOUS
  Filled 2022-01-04: qty 10

## 2022-01-04 MED ORDER — SODIUM CHLORIDE 0.9% FLUSH
10.0000 mL | INTRAVENOUS | Status: DC | PRN
  Administered 2022-01-04: 10 mL

## 2022-01-04 MED ORDER — SODIUM CHLORIDE 0.9 % IV SOLN: Freq: Once | INTRAVENOUS | Status: AC

## 2022-01-04 MED ORDER — SODIUM CHLORIDE 0.9 % IV SOLN
49.5000 mg/m2 | Freq: Once | INTRAVENOUS | Status: AC
  Administered 2022-01-04: 100 mg via INTRAVENOUS
  Filled 2022-01-04: qty 10

## 2022-01-04 MED ORDER — HEPARIN SOD (PORK) LOCK FLUSH 100 UNIT/ML IV SOLN
500.0000 [IU] | Freq: Once | INTRAVENOUS | Status: AC | PRN
  Administered 2022-01-04: 500 [IU]

## 2022-01-04 NOTE — Patient Instructions (Signed)
[facility] [place]  Discharge Instructions: ?Thank you for choosing [facility] to provide your oncology and hematology care.  ? ?If you have a lab appointment with the [facility], please go directly to the [facility] and check in at the registration area. ? ?Wear comfortable clothing and clothing appropriate for easy access to any Portacath or PICC line.  ? ?We strive to give you quality time with your provider. You may need to reschedule your appointment if you arrive late (15 or more minutes).  Arriving late affects you and other patients whose appointments are after yours.  Also, if you miss three or more appointments without notifying the office, you may be dismissed from the clinic at the provider?s discretion.    ?  ?For prescription refill requests, have your pharmacy contact our office and allow 72 hours for refills to be completed.   ? ?Today you received the following chemotherapy and/or immunotherapy agents Vidaza     ?  ?To help prevent nausea and vomiting after your treatment, we encourage you to take your nausea medication as directed. ? ?BELOW ARE SYMPTOMS THAT SHOULD BE REPORTED IMMEDIATELY: ?*FEVER GREATER THAN 100.4 F (38 ?C) OR HIGHER ?*CHILLS OR SWEATING ?*NAUSEA AND VOMITING THAT IS NOT CONTROLLED WITH YOUR NAUSEA MEDICATION ?*UNUSUAL SHORTNESS OF BREATH ?*UNUSUAL BRUISING OR BLEEDING ?*URINARY PROBLEMS (pain or burning when urinating, or frequent urination) ?*BOWEL PROBLEMS (unusual diarrhea, constipation, pain near the anus) ?TENDERNESS IN MOUTH AND THROAT WITH OR WITHOUT PRESENCE OF ULCERS (sore throat, sores in mouth, or a toothache) ?UNUSUAL RASH, SWELLING OR PAIN  ?UNUSUAL VAGINAL DISCHARGE OR ITCHING  ? ?Items with * indicate a potential emergency and should be followed up as soon as possible or go to the Emergency Department if any problems should occur. ? ?Please show the CHEMOTHERAPY ALERT CARD or IMMUNOTHERAPY ALERT CARD at check-in to the  Emergency Department and triage nurse. ?Should you have questions after your visit or need to cancel or reschedule your appointment, please contact [facility]  [phone] and follow the prompts.  Office hours are 8:00 a.m. to 4:30 p.m. Monday - Friday. Please note that voicemails left after 4:00 p.m. may not be returned until the following business day.  We are closed weekends and major holidays. You have access to a nurse at all times for urgent questions. Please call the main number to the clinic [phone] and follow the prompts. ? ?For any non-urgent questions, you may also contact your provider using MyChart. We now offer e-Visits for anyone 45 and older to request care online for non-urgent symptoms. For details visit [email or web address]. ?  ?Also download the MyChart app! Go to the app store, search "MyChart", open the app, select [facility], and log in with your MyChart username and password. ? ?Due to Covid, a mask is required upon entering the hospital/clinic. If you do not have a mask, one will be given to you upon arrival. For doctor visits, patients may have 1 support person aged 53 or older with them. For treatment visits, patients cannot have anyone with them due to current Covid guidelines and our immunocompromised population.  ?

## 2022-01-05 ENCOUNTER — Other Ambulatory Visit: Payer: Self-pay | Admitting: *Deleted

## 2022-01-05 ENCOUNTER — Inpatient Hospital Stay: Payer: Medicare Other

## 2022-01-05 ENCOUNTER — Other Ambulatory Visit: Payer: Self-pay

## 2022-01-05 VITALS — BP 133/50 | HR 81 | Temp 97.8°F | Resp 17 | Ht 70.0 in

## 2022-01-05 DIAGNOSIS — C9501 Acute leukemia of unspecified cell type, in remission: Secondary | ICD-10-CM

## 2022-01-05 DIAGNOSIS — C9500 Acute leukemia of unspecified cell type not having achieved remission: Secondary | ICD-10-CM

## 2022-01-05 DIAGNOSIS — R0602 Shortness of breath: Secondary | ICD-10-CM

## 2022-01-05 DIAGNOSIS — Z95828 Presence of other vascular implants and grafts: Secondary | ICD-10-CM

## 2022-01-05 MED ORDER — SODIUM CHLORIDE 0.9 % IV SOLN: Freq: Once | INTRAVENOUS | Status: AC

## 2022-01-05 MED ORDER — PALONOSETRON HCL INJECTION 0.25 MG/5ML
0.2500 mg | Freq: Once | INTRAVENOUS | Status: AC
  Administered 2022-01-05: 0.25 mg via INTRAVENOUS
  Filled 2022-01-05: qty 5

## 2022-01-05 MED ORDER — HEPARIN SOD (PORK) LOCK FLUSH 100 UNIT/ML IV SOLN
500.0000 [IU] | Freq: Once | INTRAVENOUS | Status: AC | PRN
  Administered 2022-01-05: 500 [IU]

## 2022-01-05 MED ORDER — SODIUM CHLORIDE 0.9% FLUSH
10.0000 mL | INTRAVENOUS | Status: DC | PRN
  Administered 2022-01-05: 10 mL

## 2022-01-05 MED ORDER — SODIUM CHLORIDE 0.9 % IV SOLN
49.5000 mg/m2 | Freq: Once | INTRAVENOUS | Status: AC
  Administered 2022-01-05: 100 mg via INTRAVENOUS
  Filled 2022-01-05: qty 10

## 2022-01-05 MED ORDER — SODIUM CHLORIDE 0.9 % IV SOLN
10.0000 mg | Freq: Once | INTRAVENOUS | Status: AC
  Administered 2022-01-05: 10 mg via INTRAVENOUS
  Filled 2022-01-05: qty 10

## 2022-01-05 NOTE — Patient Instructions (Signed)
[facility] [place]  Discharge Instructions: ?Thank you for choosing [facility] to provide your oncology and hematology care.  ? ?If you have a lab appointment with the [facility], please go directly to the [facility] and check in at the registration area. ? ?Wear comfortable clothing and clothing appropriate for easy access to any Portacath or PICC line.  ? ?We strive to give you quality time with your provider. You may need to reschedule your appointment if you arrive late (15 or more minutes).  Arriving late affects you and other patients whose appointments are after yours.  Also, if you miss three or more appointments without notifying the office, you may be dismissed from the clinic at the provider?s discretion.    ?  ?For prescription refill requests, have your pharmacy contact our office and allow 72 hours for refills to be completed.   ? ?Today you received the following chemotherapy and/or immunotherapy agents Vidaza ?    ?  ?To help prevent nausea and vomiting after your treatment, we encourage you to take your nausea medication as directed. ? ?BELOW ARE SYMPTOMS THAT SHOULD BE REPORTED IMMEDIATELY: ?*FEVER GREATER THAN 100.4 F (38 ?C) OR HIGHER ?*CHILLS OR SWEATING ?*NAUSEA AND VOMITING THAT IS NOT CONTROLLED WITH YOUR NAUSEA MEDICATION ?*UNUSUAL SHORTNESS OF BREATH ?*UNUSUAL BRUISING OR BLEEDING ?*URINARY PROBLEMS (pain or burning when urinating, or frequent urination) ?*BOWEL PROBLEMS (unusual diarrhea, constipation, pain near the anus) ?TENDERNESS IN MOUTH AND THROAT WITH OR WITHOUT PRESENCE OF ULCERS (sore throat, sores in mouth, or a toothache) ?UNUSUAL RASH, SWELLING OR PAIN  ?UNUSUAL VAGINAL DISCHARGE OR ITCHING  ? ?Items with * indicate a potential emergency and should be followed up as soon as possible or go to the Emergency Department if any problems should occur. ? ?Please show the CHEMOTHERAPY ALERT CARD or IMMUNOTHERAPY ALERT CARD at check-in to the  Emergency Department and triage nurse. ?Should you have questions after your visit or need to cancel or reschedule your appointment, please contact [facility]  [phone] and follow the prompts.  Office hours are 8:00 a.m. to 4:30 p.m. Monday - Friday. Please note that voicemails left after 4:00 p.m. may not be returned until the following business day.  We are closed weekends and major holidays. You have access to a nurse at all times for urgent questions. Please call the main number to the clinic [phone] and follow the prompts. ? ?For any non-urgent questions, you may also contact your provider using MyChart. We now offer e-Visits for anyone 58 and older to request care online for non-urgent symptoms. For details visit [email or web address]. ?  ?Also download the MyChart app! Go to the app store, search "MyChart", open the app, select [facility], and log in with your MyChart username and password. ? ?Due to Covid, a mask is required upon entering the hospital/clinic. If you do not have a mask, one will be given to you upon arrival. For doctor visits, patients may have 1 support person aged 36 or older with them. For treatment visits, patients cannot have anyone with them due to current Covid guidelines and our immunocompromised population.  ?

## 2022-01-08 ENCOUNTER — Other Ambulatory Visit: Payer: Self-pay

## 2022-01-08 ENCOUNTER — Inpatient Hospital Stay: Payer: Medicare Other

## 2022-01-08 VITALS — BP 107/56 | HR 69 | Temp 97.7°F | Resp 18

## 2022-01-08 DIAGNOSIS — Z95828 Presence of other vascular implants and grafts: Secondary | ICD-10-CM

## 2022-01-08 DIAGNOSIS — C9501 Acute leukemia of unspecified cell type, in remission: Secondary | ICD-10-CM

## 2022-01-08 DIAGNOSIS — C9500 Acute leukemia of unspecified cell type not having achieved remission: Secondary | ICD-10-CM | POA: Diagnosis not present

## 2022-01-08 DIAGNOSIS — R0602 Shortness of breath: Secondary | ICD-10-CM

## 2022-01-08 DIAGNOSIS — Z452 Encounter for adjustment and management of vascular access device: Secondary | ICD-10-CM

## 2022-01-08 LAB — CBC WITH DIFFERENTIAL (CANCER CENTER ONLY)
Abs Immature Granulocytes: 0.01 10*3/uL (ref 0.00–0.07)
Basophils Absolute: 0 10*3/uL (ref 0.0–0.1)
Basophils Relative: 0 %
Eosinophils Absolute: 0 10*3/uL (ref 0.0–0.5)
Eosinophils Relative: 0 %
HCT: 29.5 % — ABNORMAL LOW (ref 39.0–52.0)
Hemoglobin: 9.5 g/dL — ABNORMAL LOW (ref 13.0–17.0)
Immature Granulocytes: 1 %
Lymphocytes Relative: 38 %
Lymphs Abs: 0.7 10*3/uL (ref 0.7–4.0)
MCH: 30 pg (ref 26.0–34.0)
MCHC: 32.2 g/dL (ref 30.0–36.0)
MCV: 93.1 fL (ref 80.0–100.0)
Monocytes Absolute: 0.4 10*3/uL (ref 0.1–1.0)
Monocytes Relative: 23 %
Neutro Abs: 0.7 10*3/uL — ABNORMAL LOW (ref 1.7–7.7)
Neutrophils Relative %: 38 %
Platelet Count: 51 10*3/uL — ABNORMAL LOW (ref 150–400)
RBC: 3.17 MIL/uL — ABNORMAL LOW (ref 4.22–5.81)
RDW: 20.6 % — ABNORMAL HIGH (ref 11.5–15.5)
WBC Count: 1.8 10*3/uL — ABNORMAL LOW (ref 4.0–10.5)
nRBC: 1.1 % — ABNORMAL HIGH (ref 0.0–0.2)

## 2022-01-08 LAB — CMP (CANCER CENTER ONLY)
ALT: 10 U/L (ref 0–44)
AST: 9 U/L — ABNORMAL LOW (ref 15–41)
Albumin: 3.3 g/dL — ABNORMAL LOW (ref 3.5–5.0)
Alkaline Phosphatase: 51 U/L (ref 38–126)
Anion gap: 5 (ref 5–15)
BUN: 22 mg/dL (ref 8–23)
CO2: 28 mmol/L (ref 22–32)
Calcium: 7.6 mg/dL — ABNORMAL LOW (ref 8.9–10.3)
Chloride: 105 mmol/L (ref 98–111)
Creatinine: 1.15 mg/dL (ref 0.61–1.24)
GFR, Estimated: >60 mL/min (ref >60)
Glucose, Bld: 106 mg/dL — ABNORMAL HIGH (ref 70–99)
Potassium: 3.7 mmol/L (ref 3.5–5.1)
Sodium: 138 mmol/L (ref 135–145)
Total Bilirubin: 1 mg/dL (ref 0.3–1.2)
Total Protein: 4.5 g/dL — ABNORMAL LOW (ref 6.5–8.1)

## 2022-01-08 LAB — MAGNESIUM: Magnesium: 2.1 mg/dL (ref 1.7–2.4)

## 2022-01-08 MED ORDER — SODIUM CHLORIDE 0.9% FLUSH
10.0000 mL | Freq: Once | INTRAVENOUS | Status: AC
  Administered 2022-01-08: 10 mL

## 2022-01-08 MED ORDER — HEPARIN SOD (PORK) LOCK FLUSH 100 UNIT/ML IV SOLN
250.0000 [IU] | Freq: Once | INTRAVENOUS | Status: AC
  Administered 2022-01-08: 500 [IU]

## 2022-01-08 NOTE — Patient Instructions (Signed)

## 2022-01-08 NOTE — Progress Notes (Signed)
Reviewed neutropenic precautions with patient that included good hand-washing, wearing a mask, and avoid crowds. Reviewed fever guidelines with patient who verbalized understanding. Pt had no further questions. ?

## 2022-01-10 ENCOUNTER — Other Ambulatory Visit: Payer: Self-pay

## 2022-01-10 MED ORDER — AMOXICILLIN 500 MG PO TABS: 500.0000 mg | ORAL_TABLET | ORAL | 3 refills | Status: DC | PRN

## 2022-01-11 ENCOUNTER — Other Ambulatory Visit: Payer: Self-pay | Admitting: *Deleted

## 2022-01-11 ENCOUNTER — Encounter (INDEPENDENT_AMBULATORY_CARE_PROVIDER_SITE_OTHER): Payer: Medicare Other | Admitting: Ophthalmology

## 2022-01-11 ENCOUNTER — Other Ambulatory Visit: Payer: Self-pay | Admitting: Oncology

## 2022-01-11 ENCOUNTER — Other Ambulatory Visit: Payer: Self-pay

## 2022-01-11 DIAGNOSIS — H35371 Puckering of macula, right eye: Secondary | ICD-10-CM | POA: Diagnosis not present

## 2022-01-11 DIAGNOSIS — H353132 Nonexudative age-related macular degeneration, bilateral, intermediate dry stage: Secondary | ICD-10-CM | POA: Diagnosis not present

## 2022-01-11 DIAGNOSIS — H43811 Vitreous degeneration, right eye: Secondary | ICD-10-CM | POA: Diagnosis not present

## 2022-01-11 MED ORDER — AMOXICILLIN 500 MG PO TABS: ORAL_TABLET | ORAL | 0 refills | Status: AC

## 2022-01-12 ENCOUNTER — Other Ambulatory Visit: Payer: Self-pay | Admitting: *Deleted

## 2022-01-12 DIAGNOSIS — Z452 Encounter for adjustment and management of vascular access device: Secondary | ICD-10-CM

## 2022-01-12 DIAGNOSIS — C9500 Acute leukemia of unspecified cell type not having achieved remission: Secondary | ICD-10-CM

## 2022-01-12 DIAGNOSIS — C9501 Acute leukemia of unspecified cell type, in remission: Secondary | ICD-10-CM

## 2022-01-15 ENCOUNTER — Telehealth: Payer: Self-pay | Admitting: *Deleted

## 2022-01-15 ENCOUNTER — Other Ambulatory Visit: Payer: Self-pay

## 2022-01-15 ENCOUNTER — Inpatient Hospital Stay: Payer: Medicare Other

## 2022-01-15 VITALS — BP 108/38 | HR 80 | Temp 98.4°F | Resp 18

## 2022-01-15 DIAGNOSIS — C9501 Acute leukemia of unspecified cell type, in remission: Secondary | ICD-10-CM

## 2022-01-15 DIAGNOSIS — C9500 Acute leukemia of unspecified cell type not having achieved remission: Secondary | ICD-10-CM

## 2022-01-15 DIAGNOSIS — Z452 Encounter for adjustment and management of vascular access device: Secondary | ICD-10-CM

## 2022-01-15 LAB — CMP (CANCER CENTER ONLY)
ALT: 6 U/L (ref 0–44)
AST: 8 U/L — ABNORMAL LOW (ref 15–41)
Albumin: 3.5 g/dL (ref 3.5–5.0)
Alkaline Phosphatase: 53 U/L (ref 38–126)
Anion gap: 7 (ref 5–15)
BUN: 15 mg/dL (ref 8–23)
CO2: 27 mmol/L (ref 22–32)
Calcium: 8.2 mg/dL — ABNORMAL LOW (ref 8.9–10.3)
Chloride: 106 mmol/L (ref 98–111)
Creatinine: 1.04 mg/dL (ref 0.61–1.24)
GFR, Estimated: >60 mL/min (ref >60)
Glucose, Bld: 105 mg/dL — ABNORMAL HIGH (ref 70–99)
Potassium: 3.5 mmol/L (ref 3.5–5.1)
Sodium: 140 mmol/L (ref 135–145)
Total Bilirubin: 0.8 mg/dL (ref 0.3–1.2)
Total Protein: 4.7 g/dL — ABNORMAL LOW (ref 6.5–8.1)

## 2022-01-15 LAB — CBC WITH DIFFERENTIAL (CANCER CENTER ONLY)
Abs Immature Granulocytes: 0 10*3/uL (ref 0.00–0.07)
Basophils Absolute: 0 10*3/uL (ref 0.0–0.1)
Basophils Relative: 1 %
Eosinophils Absolute: 0 10*3/uL (ref 0.0–0.5)
Eosinophils Relative: 0 %
HCT: 29 % — ABNORMAL LOW (ref 39.0–52.0)
Hemoglobin: 8.9 g/dL — ABNORMAL LOW (ref 13.0–17.0)
Lymphocytes Relative: 81 %
Lymphs Abs: 1 10*3/uL (ref 0.7–4.0)
MCH: 29.2 pg (ref 26.0–34.0)
MCHC: 30.7 g/dL (ref 30.0–36.0)
MCV: 95.1 fL (ref 80.0–100.0)
Monocytes Absolute: 0.1 10*3/uL (ref 0.1–1.0)
Monocytes Relative: 12 %
Neutro Abs: 0.1 10*3/uL — CL (ref 1.7–7.7)
Neutrophils Relative %: 6 %
Platelet Count: 19 10*3/uL — ABNORMAL LOW (ref 150–400)
RBC: 3.05 MIL/uL — ABNORMAL LOW (ref 4.22–5.81)
RDW: 21.1 % — ABNORMAL HIGH (ref 11.5–15.5)
Smear Review: NORMAL
WBC Count: 1.2 10*3/uL — ABNORMAL LOW (ref 4.0–10.5)
nRBC: 0 % (ref 0.0–0.2)

## 2022-01-15 LAB — MAGNESIUM: Magnesium: 2 mg/dL (ref 1.7–2.4)

## 2022-01-15 MED ORDER — HEPARIN SOD (PORK) LOCK FLUSH 100 UNIT/ML IV SOLN
500.0000 [IU] | Freq: Once | INTRAVENOUS | Status: AC
  Administered 2022-01-15: 500 [IU] via INTRAVENOUS

## 2022-01-15 MED ORDER — SODIUM CHLORIDE 0.9% FLUSH
10.0000 mL | Freq: Once | INTRAVENOUS | Status: AC
  Administered 2022-01-15: 10 mL via INTRAVENOUS

## 2022-01-15 MED ORDER — SODIUM CHLORIDE 0.9 % IV SOLN: INTRAVENOUS | Status: DC

## 2022-01-15 NOTE — Telephone Encounter (Signed)
Critical result of ANC .1 per Ulice Dash in Pharmacy.  Dr [name] notified.  All CBC results reviewed with patient.  Dr [name] wants patient to restart Fluconazole, Bactrim and Acyclovir and to stay on these indefinately.  Patient aware.  ?

## 2022-01-15 NOTE — Progress Notes (Signed)
Late entry, pt in for lab work today. Drawn via port-a-cath. BP obtained at this time and was 110/40 HR of 80, patient states he is still SOB and just feels out of sorts. Patient appears pale but is alert, orientated and breathing without difficulty. CBC reviewed by MD. Who advised 1 liter of fluids, no intervention for blood or platelets at this time. [place] resulted and pt was advised per MD to start his acyclovir, fluconazole and bactrim that he currently has at home and return Thursday 3/16 for lab recheck . Pt verbalized understanding and states he has a dentist appt coming up and wanted to know if he should cancel. Informed pt since his [place] and plt count was so low we advise he cancels it for now. Pt finished 1 liter of NS, BP rechecked and was 108/38. Pt would like to go home at this time. He was advised to recheck his BP at home, to remain hydrated and call if he has any increase SOB, dizziness or any other concerns.  ?

## 2022-01-17 ENCOUNTER — Other Ambulatory Visit: Payer: Self-pay | Admitting: *Deleted

## 2022-01-17 DIAGNOSIS — C9501 Acute leukemia of unspecified cell type, in remission: Secondary | ICD-10-CM

## 2022-01-17 DIAGNOSIS — Z95828 Presence of other vascular implants and grafts: Secondary | ICD-10-CM

## 2022-01-17 DIAGNOSIS — C9500 Acute leukemia of unspecified cell type not having achieved remission: Secondary | ICD-10-CM

## 2022-01-18 ENCOUNTER — Inpatient Hospital Stay: Payer: Medicare Other

## 2022-01-18 ENCOUNTER — Other Ambulatory Visit: Payer: Self-pay

## 2022-01-18 ENCOUNTER — Telehealth: Payer: Self-pay | Admitting: *Deleted

## 2022-01-18 VITALS — BP 133/51 | HR 88 | Temp 97.9°F | Resp 17 | Ht 70.0 in | Wt 214.0 lb

## 2022-01-18 DIAGNOSIS — C9500 Acute leukemia of unspecified cell type not having achieved remission: Secondary | ICD-10-CM | POA: Diagnosis not present

## 2022-01-18 LAB — CMP (CANCER CENTER ONLY)
ALT: 6 U/L (ref 0–44)
AST: 8 U/L — ABNORMAL LOW (ref 15–41)
Albumin: 3.7 g/dL (ref 3.5–5.0)
Alkaline Phosphatase: 68 U/L (ref 38–126)
Anion gap: 10 (ref 5–15)
BUN: 13 mg/dL (ref 8–23)
CO2: 25 mmol/L (ref 22–32)
Calcium: 8.6 mg/dL — ABNORMAL LOW (ref 8.9–10.3)
Chloride: 105 mmol/L (ref 98–111)
Creatinine: 1.17 mg/dL (ref 0.61–1.24)
GFR, Estimated: >60 mL/min (ref >60)
Glucose, Bld: 111 mg/dL — ABNORMAL HIGH (ref 70–99)
Potassium: 3.8 mmol/L (ref 3.5–5.1)
Sodium: 140 mmol/L (ref 135–145)
Total Bilirubin: 0.8 mg/dL (ref 0.3–1.2)
Total Protein: 5.6 g/dL — ABNORMAL LOW (ref 6.5–8.1)

## 2022-01-18 LAB — CBC WITH DIFFERENTIAL (CANCER CENTER ONLY)
Abs Immature Granulocytes: 0 10*3/uL (ref 0.00–0.07)
Basophils Absolute: 0 10*3/uL (ref 0.0–0.1)
Basophils Relative: 0 %
Eosinophils Absolute: 0 10*3/uL (ref 0.0–0.5)
Eosinophils Relative: 0 %
HCT: 29 % — ABNORMAL LOW (ref 39.0–52.0)
Hemoglobin: 8.9 g/dL — ABNORMAL LOW (ref 13.0–17.0)
Lymphocytes Relative: 64 %
Lymphs Abs: 0.8 10*3/uL (ref 0.7–4.0)
MCH: 29.3 pg (ref 26.0–34.0)
MCHC: 30.7 g/dL (ref 30.0–36.0)
MCV: 95.4 fL (ref 80.0–100.0)
Monocytes Absolute: 0.2 10*3/uL (ref 0.1–1.0)
Monocytes Relative: 18 %
Neutro Abs: 0.2 10*3/uL — CL (ref 1.7–7.7)
Neutrophils Relative %: 18 %
Platelet Count: 19 10*3/uL — ABNORMAL LOW (ref 150–400)
RBC: 3.04 MIL/uL — ABNORMAL LOW (ref 4.22–5.81)
RDW: 21.1 % — ABNORMAL HIGH (ref 11.5–15.5)
WBC Count: 1.3 10*3/uL — ABNORMAL LOW (ref 4.0–10.5)
nRBC: 3.1 % — ABNORMAL HIGH (ref 0.0–0.2)
nRBC: 4 /100 WBC — ABNORMAL HIGH

## 2022-01-18 MED ORDER — HEPARIN SOD (PORK) LOCK FLUSH 100 UNIT/ML IV SOLN
250.0000 [IU] | Freq: Once | INTRAVENOUS | Status: AC
  Administered 2022-01-18: 500 [IU]

## 2022-01-18 MED ORDER — SODIUM CHLORIDE 0.9% FLUSH
10.0000 mL | Freq: Once | INTRAVENOUS | Status: AC
  Administered 2022-01-18: 10 mL

## 2022-01-18 MED ORDER — KETOROLAC TROMETHAMINE 15 MG/ML IJ SOLN
30.0000 mg | Freq: Once | INTRAMUSCULAR | Status: AC
  Administered 2022-01-18: 30 mg via INTRAVENOUS
  Filled 2022-01-18: qty 2

## 2022-01-18 MED ORDER — SODIUM CHLORIDE 0.9 % IV SOLN
1000.0000 mL | Freq: Once | INTRAVENOUS | Status: AC
  Administered 2022-01-18: 1000 mL via INTRAVENOUS

## 2022-01-18 NOTE — Patient Instructions (Signed)

## 2022-01-18 NOTE — Telephone Encounter (Signed)
Dr. [name] notified of ANC-0.2 and platelet count-19. No new orders received at this time.  ?

## 2022-01-18 NOTE — Progress Notes (Signed)
Pt here for PAC Flush, requesting IVF and pain medication. PT states " I dont take the pain medicine at home unless the pain is really bad." ? Pt denied taking pain medicine at home this am. Will review with MD. ?

## 2022-01-19 ENCOUNTER — Other Ambulatory Visit: Payer: Self-pay

## 2022-01-19 DIAGNOSIS — C9500 Acute leukemia of unspecified cell type not having achieved remission: Secondary | ICD-10-CM

## 2022-01-22 ENCOUNTER — Inpatient Hospital Stay: Payer: Medicare Other

## 2022-01-22 ENCOUNTER — Inpatient Hospital Stay (HOSPITAL_BASED_OUTPATIENT_CLINIC_OR_DEPARTMENT_OTHER): Payer: Medicare Other | Admitting: Hematology & Oncology

## 2022-01-22 ENCOUNTER — Other Ambulatory Visit: Payer: Self-pay

## 2022-01-22 VITALS — BP 108/39 | HR 87 | Temp 97.4°F | Resp 17

## 2022-01-22 DIAGNOSIS — C9500 Acute leukemia of unspecified cell type not having achieved remission: Secondary | ICD-10-CM | POA: Diagnosis not present

## 2022-01-22 DIAGNOSIS — C9501 Acute leukemia of unspecified cell type, in remission: Secondary | ICD-10-CM | POA: Diagnosis not present

## 2022-01-22 LAB — CBC WITH DIFFERENTIAL (CANCER CENTER ONLY)
Abs Immature Granulocytes: 0 10*3/uL (ref 0.00–0.07)
Band Neutrophils: 1 %
Basophils Absolute: 0 10*3/uL (ref 0.0–0.1)
Basophils Relative: 1 %
Blasts: 8 %
Eosinophils Absolute: 0 10*3/uL (ref 0.0–0.5)
Eosinophils Relative: 0 %
HCT: 28.2 % — ABNORMAL LOW (ref 39.0–52.0)
Hemoglobin: 8.7 g/dL — ABNORMAL LOW (ref 13.0–17.0)
Lymphocytes Relative: 56 %
Lymphs Abs: 0.8 10*3/uL (ref 0.7–4.0)
MCH: 29 pg (ref 26.0–34.0)
MCHC: 30.9 g/dL (ref 30.0–36.0)
MCV: 94 fL (ref 80.0–100.0)
Monocytes Absolute: 0.4 10*3/uL (ref 0.1–1.0)
Monocytes Relative: 24 %
Myelocytes: 1 %
Neutro Abs: 0.2 10*3/uL — CL (ref 1.7–7.7)
Neutrophils Relative %: 9 %
Platelet Count: 26 10*3/uL — ABNORMAL LOW (ref 150–400)
RBC: 3 MIL/uL — ABNORMAL LOW (ref 4.22–5.81)
RDW: 20.6 % — ABNORMAL HIGH (ref 11.5–15.5)
Smear Review: NORMAL
WBC Count: 1.5 10*3/uL — ABNORMAL LOW (ref 4.0–10.5)
nRBC: 2 /100 WBC — ABNORMAL HIGH
nRBC: 2.6 % — ABNORMAL HIGH (ref 0.0–0.2)

## 2022-01-22 LAB — CMP (CANCER CENTER ONLY)
ALT: 6 U/L (ref 0–44)
AST: 8 U/L — ABNORMAL LOW (ref 15–41)
Albumin: 3.6 g/dL (ref 3.5–5.0)
Alkaline Phosphatase: 60 U/L (ref 38–126)
Anion gap: 10 (ref 5–15)
BUN: 14 mg/dL (ref 8–23)
CO2: 24 mmol/L (ref 22–32)
Calcium: 8.3 mg/dL — ABNORMAL LOW (ref 8.9–10.3)
Chloride: 103 mmol/L (ref 98–111)
Creatinine: 1.17 mg/dL (ref 0.61–1.24)
GFR, Estimated: >60 mL/min (ref >60)
Glucose, Bld: 105 mg/dL — ABNORMAL HIGH (ref 70–99)
Potassium: 4 mmol/L (ref 3.5–5.1)
Sodium: 137 mmol/L (ref 135–145)
Total Bilirubin: 1 mg/dL (ref 0.3–1.2)
Total Protein: 5.5 g/dL — ABNORMAL LOW (ref 6.5–8.1)

## 2022-01-22 LAB — MAGNESIUM: Magnesium: 2.1 mg/dL (ref 1.7–2.4)

## 2022-01-22 MED ORDER — SODIUM CHLORIDE 0.9% FLUSH
10.0000 mL | Freq: Once | INTRAVENOUS | Status: AC
  Administered 2022-01-22: 10 mL via INTRAVENOUS

## 2022-01-22 MED ORDER — HEPARIN SOD (PORK) LOCK FLUSH 100 UNIT/ML IV SOLN
500.0000 [IU] | Freq: Once | INTRAVENOUS | Status: AC
  Administered 2022-01-22: 500 [IU] via INTRAVENOUS

## 2022-01-22 NOTE — Progress Notes (Addendum)
?Hematology and Oncology Follow Up Visit ? ?Victor [name] ?[id number] ?June 03, 1936 86 y.o. ?01/22/2022 ? ? 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 ?Current Therapy:   ?Vidaza/Venetoclax.--  S/p cycle #11 ?Eliquis 2.5 mg p.o. twice daily --started on 03/16/2021 ?    ?Interim History:  Victor [name] is in for an unscheduled visit.  He is having a tough time right now.  Is hard to say what is going on. ? ?I know he has been followed out at [facility] because of these pulmonary lesions.  He has had a bronchoscopy.  I did speak with Dr. Linus Orn, who is his leukemia doctor at [facility].  Dr. Linus Orn said that the bronchoscopy and biopsy did not show any infectious organisms.  I think another CT scans can be planned when Victor [name] goes back to see him. ? ? Victor [name] feels tired.  He does not have a lot of energy.  He is more anemic.  He may need to be transfused. ? ?He is appetite is doing okay.  He has had no mouth sores.  He has had no diarrhea.  He may be a little constipated.  There is no bleeding. ? ?I just feel bad that he is having a tough time right now.  He has had quite a bit of therapy.  He is on a reduced dose of treatment already. ? ?He is on Eliquis.  He is on low-dose Eliquis because of past history of thromboembolic disease. ? ?He has had no cough.  Is been no increase shortness of breath. ? ?Overall, I would say his performance status is probably ECOG 1.     ? ? ?Medications:  ?Current Outpatient Medications:  ?  acetaminophen (TYLENOL) 500 MG tablet, Take 1,000 mg by mouth every 6 (six) hours as needed for moderate pain., Disp: , Rfl:  ?  AMBULATORY NON FORMULARY MEDICATION, Premier Protein Vanilla, Disp: 12 Bottle, Rfl: 2 ?  amoxicillin (AMOXIL) 500 MG tablet, Take 4 tablets (2068m) 1 hour prior to dental procedure., Disp: 4 tablet, Rfl: 0 ?  apixaban (ELIQUIS) 2.5 MG TABS tablet, Take 1 tablet (2.5 mg total) by mouth 2  (two) times daily. (Patient not taking: Reported on 01/01/2022), Disp: 60 tablet, Rfl: 6 ?  doxazosin (CARDURA) 2 MG tablet, Take 1 tablet (2 mg total) by mouth at bedtime., Disp: 90 tablet, Rfl: 3 ?  gabapentin (NEURONTIN) 300 MG capsule, Take 2 capsules (600 mg total) by mouth 3 (three) times daily., Disp: 240 capsule, Rfl: 4 ?  HYDROcodone-acetaminophen (NORCO/VICODIN) 5-325 MG tablet, Take 1 tablet by mouth every 6 (six) hours as needed for moderate pain., Disp: , Rfl:  ?  ipratropium (ATROVENT) 0.06 % nasal spray, Place 1 spray into both nostrils 2 (two) times daily as needed for rhinitis., Disp: , Rfl:  ?  ketoconazole (NIZORAL) 2 % shampoo, Apply 1 application topically 2 (two) times a week., Disp: , Rfl:  ?  levothyroxine (SYNTHROID) 150 MCG tablet, Take 1 tablet (150 mcg total) by mouth daily., Disp: 90 tablet, Rfl: 3 ?  Lidocaine 4 % PTCH, Apply 0.5 patches topically daily as needed (pain). Uses 1/2 patch daily., Disp: , Rfl:  ?  lidocaine-prilocaine (EMLA) cream, Apply 1 application topically as needed for up to 30 doses. (Patient taking differently: Apply 1 application topically daily as needed (port access).), Disp: 30 g, Rfl: 0 ?  loratadine (CLARITIN) 10 MG tablet, Take 10 mg by mouth daily as needed (  allergies). Allertec, Disp: , Rfl:  ?  Melatonin 5 MG CHEW, Chew 5 mg by mouth at bedtime., Disp: , Rfl:  ?  omeprazole (PRILOSEC) 20 MG capsule, Take 1 capsule by mouth as needed., Disp: , Rfl:  ?  Polyethyl Glycol-Propyl Glycol (DRY EYE RELIEF) 0.4-0.3 % GEL ophthalmic gel, 1 application 4 (four) times daily as needed (dry eyes)., Disp: , Rfl:  ?  potassium chloride (MICRO-K) 10 MEQ CR capsule, Take 10 mEq by mouth., Disp: , Rfl:  ?  propranolol ER (INDERAL LA) 80 MG 24 hr capsule, TAKE ONE CAPSULE BY MOUTH ONE TIME DAILY, Disp: 90 capsule, Rfl: 0 ?  simvastatin (ZOCOR) 40 MG tablet, TAKE ONE TABLET BY MOUTH DAILY AT BEDTIME (Patient taking differently: Take 40 mg by mouth daily at 6 PM.), Disp: 90  tablet, Rfl: 3 ?  venetoclax 100 MG TABS, Take 400 mg by mouth daily. Takes 400 mg daily for 1 weeks and stops for 5 weeks., Disp: , Rfl:  ?No current facility-administered medications for this visit. ? ?Facility-Administered Medications Ordered in Other Visits:  ?  sodium chloride flush (NS) 0.9 % injection 10 mL, 10 mL, Intravenous, PRN, Volanda Napoleon, MD, 10 mL at 01/01/22 1120 ? ?Allergies:  ?Allergies  ?Allergen Reactions  ? Augmentin [Amoxicillin-Pot Clavulanate] Diarrhea  ?  SOB  ? ? ?Past Medical History, Surgical history, Social history, and Family History were reviewed and updated. ? ?Review of Systems: ?Review of Systems  ?Constitutional: Negative.   ?HENT:  Negative.    ?Eyes: Negative.   ?Respiratory: Negative.    ?Cardiovascular: Negative.   ?Gastrointestinal: Negative.   ?Endocrine: Negative.   ?Genitourinary: Negative.    ?Musculoskeletal: Negative.   ?Skin: Negative.   ?Neurological: Negative.   ?Hematological: Negative.   ?Psychiatric/Behavioral: Negative.    ? ?Physical Exam: ? vitals were not taken for this visit.  ? ?Wt Readings from Last 3 Encounters:  ?01/18/22 214 lb (97.1 kg)  ?01/01/22 215 lb (97.5 kg)  ?12/22/21 213 lb 3.2 oz (96.7 kg)  ? ? ?Physical Exam ?Vitals reviewed.  ?HENT:  ?   Head: Normocephalic and atraumatic.  ?Eyes:  ?   Pupils: Pupils are equal, round, and reactive to light.  ?Cardiovascular:  ?   Rate and Rhythm: Normal rate and regular rhythm.  ?   Heart sounds: Normal heart sounds.  ?Pulmonary:  ?   Effort: Pulmonary effort is normal.  ?   Breath sounds: Normal breath sounds.  ?Abdominal:  ?   General: Bowel sounds are normal.  ?   Palpations: Abdomen is soft.  ?Musculoskeletal:     ?   General: No tenderness or deformity. Normal range of motion.  ?   Cervical back: Normal range of motion.  ?Lymphadenopathy:  ?   Cervical: No cervical adenopathy.  ?Skin: ?   General: Skin is warm and dry.  ?   Findings: No erythema or rash.  ?Neurological:  ?   Mental Status: He is  alert and oriented to person, place, and time.  ?Psychiatric:     ?   Behavior: Behavior normal.     ?   Thought Content: Thought content normal.     ?   Judgment: Judgment normal.  ? ? ? ?Lab Results  ?Component Value Date  ? WBC 1.5 (L) 01/22/2022  ? HGB 8.7 (L) 01/22/2022  ? HCT 28.2 (L) 01/22/2022  ? MCV 94.0 01/22/2022  ? PLT 26 (L) 01/22/2022  ? ?  Chemistry   ?   ?  Component Value Date/Time  ? NA 137 01/22/2022 1000  ? NA 140 11/22/2021 1117  ? NA 142 03/30/2015 1149  ? K 4.0 01/22/2022 1000  ? K 4.5 03/30/2015 1149  ? CL 103 01/22/2022 1000  ? CO2 24 01/22/2022 1000  ? CO2 25 03/30/2015 1149  ? BUN 14 01/22/2022 1000  ? BUN 8 11/22/2021 1117  ? BUN 17.1 03/30/2015 1149  ? CREATININE 1.17 01/22/2022 1000  ? CREATININE 1.2 03/30/2015 1149  ?    ?Component Value Date/Time  ? CALCIUM 8.3 (L) 01/22/2022 1000  ? CALCIUM 8.7 03/30/2015 1149  ? ALKPHOS 60 01/22/2022 1000  ? AST 8 (L) 01/22/2022 1000  ? ALT 6 01/22/2022 1000  ? BILITOT 1.0 01/22/2022 1000  ?  ? ? ? ?Impression and Plan: ?Victor [name] is a very nice 86 year old white male who has acute myeloid leukemia.  He does have a adverse prognostic feature with respect to the FLT3 mutation.   ? ?I think part of the problem with him feeling so poorly is the fact that he is neutropenic.  His white cell count is 1.5.  His hemoglobin is 8.7.  Platelet count 26,000. ? ?I did look at his blood smear.  I really do not see anything that looked suspicious.  However, given that he does have acute myeloid leukemia, he certainly would be at risk for relapse.  He has been in remission for quite a while.  He may need to have another bone marrow test. ? ?We will have to follow his blood counts.  You may need to be transfused with red blood cells.  I just do not want to see his hemoglobin go down anymore.  I think this would be difficult for him.  I would have a very low threshold for transfusing him. ? ?He will be interesting to see what the next CT scan shows. ? ?He will come  back in 3 days for another blood check.  We will see if we need to transfuse him at that time. ? ?He will keep his appointment with [place] that it has already been scheduled for April.  We will see him back after he

## 2022-01-22 NOTE — Patient Instructions (Signed)

## 2022-01-24 ENCOUNTER — Other Ambulatory Visit: Payer: Self-pay | Admitting: Family Medicine

## 2022-01-24 ENCOUNTER — Other Ambulatory Visit: Payer: Self-pay

## 2022-01-24 DIAGNOSIS — I6523 Occlusion and stenosis of bilateral carotid arteries: Secondary | ICD-10-CM

## 2022-01-24 DIAGNOSIS — C9501 Acute leukemia of unspecified cell type, in remission: Secondary | ICD-10-CM

## 2022-01-25 ENCOUNTER — Telehealth: Payer: Self-pay | Admitting: *Deleted

## 2022-01-25 ENCOUNTER — Other Ambulatory Visit: Payer: Self-pay

## 2022-01-25 ENCOUNTER — Inpatient Hospital Stay: Payer: Medicare Other

## 2022-01-25 ENCOUNTER — Other Ambulatory Visit: Payer: Self-pay | Admitting: *Deleted

## 2022-01-25 VITALS — BP 121/45 | HR 70 | Temp 98.1°F | Resp 16

## 2022-01-25 DIAGNOSIS — M5412 Radiculopathy, cervical region: Secondary | ICD-10-CM

## 2022-01-25 DIAGNOSIS — C9500 Acute leukemia of unspecified cell type not having achieved remission: Secondary | ICD-10-CM

## 2022-01-25 DIAGNOSIS — D61818 Other pancytopenia: Secondary | ICD-10-CM

## 2022-01-25 DIAGNOSIS — C9501 Acute leukemia of unspecified cell type, in remission: Secondary | ICD-10-CM

## 2022-01-25 DIAGNOSIS — M5416 Radiculopathy, lumbar region: Secondary | ICD-10-CM

## 2022-01-25 DIAGNOSIS — Z452 Encounter for adjustment and management of vascular access device: Secondary | ICD-10-CM

## 2022-01-25 DIAGNOSIS — Z95828 Presence of other vascular implants and grafts: Secondary | ICD-10-CM

## 2022-01-25 LAB — CMP (CANCER CENTER ONLY)
ALT: 7 U/L (ref 0–44)
AST: 9 U/L — ABNORMAL LOW (ref 15–41)
Albumin: 3.6 g/dL (ref 3.5–5.0)
Alkaline Phosphatase: 57 U/L (ref 38–126)
Anion gap: 9 (ref 5–15)
BUN: 14 mg/dL (ref 8–23)
CO2: 25 mmol/L (ref 22–32)
Calcium: 8.5 mg/dL — ABNORMAL LOW (ref 8.9–10.3)
Chloride: 106 mmol/L (ref 98–111)
Creatinine: 1.15 mg/dL (ref 0.61–1.24)
GFR, Estimated: >60 mL/min (ref >60)
Glucose, Bld: 108 mg/dL — ABNORMAL HIGH (ref 70–99)
Potassium: 3.8 mmol/L (ref 3.5–5.1)
Sodium: 140 mmol/L (ref 135–145)
Total Bilirubin: 0.7 mg/dL (ref 0.3–1.2)
Total Protein: 5.3 g/dL — ABNORMAL LOW (ref 6.5–8.1)

## 2022-01-25 LAB — CBC WITH DIFFERENTIAL (CANCER CENTER ONLY)
Abs Immature Granulocytes: 0 10*3/uL (ref 0.00–0.07)
Basophils Absolute: 0 10*3/uL (ref 0.0–0.1)
Basophils Relative: 2 %
Blasts: 8 %
Eosinophils Absolute: 0 10*3/uL (ref 0.0–0.5)
Eosinophils Relative: 0 %
HCT: 28.1 % — ABNORMAL LOW (ref 39.0–52.0)
Hemoglobin: 8.6 g/dL — ABNORMAL LOW (ref 13.0–17.0)
Lymphocytes Relative: 56 %
Lymphs Abs: 1.1 10*3/uL (ref 0.7–4.0)
MCH: 28.4 pg (ref 26.0–34.0)
MCHC: 30.6 g/dL (ref 30.0–36.0)
MCV: 92.7 fL (ref 80.0–100.0)
Monocytes Absolute: 0.5 10*3/uL (ref 0.1–1.0)
Monocytes Relative: 26 %
Neutro Abs: 0.2 10*3/uL — CL (ref 1.7–7.7)
Neutrophils Relative %: 8 %
Platelet Count: 37 10*3/uL — ABNORMAL LOW (ref 150–400)
RBC: 3.03 MIL/uL — ABNORMAL LOW (ref 4.22–5.81)
RDW: 20.5 % — ABNORMAL HIGH (ref 11.5–15.5)
WBC Count: 1.9 10*3/uL — ABNORMAL LOW (ref 4.0–10.5)
nRBC: 2.7 % — ABNORMAL HIGH (ref 0.0–0.2)

## 2022-01-25 LAB — PREPARE RBC (CROSSMATCH)

## 2022-01-25 LAB — SAMPLE TO BLOOD BANK

## 2022-01-25 MED ORDER — SODIUM CHLORIDE 0.9% FLUSH
10.0000 mL | INTRAVENOUS | Status: DC | PRN
  Administered 2022-01-25: 10 mL via INTRAVENOUS

## 2022-01-25 MED ORDER — HEPARIN SOD (PORK) LOCK FLUSH 100 UNIT/ML IV SOLN
500.0000 [IU] | Freq: Once | INTRAVENOUS | Status: AC
  Administered 2022-01-25: 500 [IU] via INTRAVENOUS

## 2022-01-25 NOTE — Patient Instructions (Signed)

## 2022-01-25 NOTE — Telephone Encounter (Signed)
Dr. Ennever notified of ANC-0.2.  No new orders received at this time.  

## 2022-01-25 NOTE — Patient Instructions (Signed)

## 2022-01-25 NOTE — Telephone Encounter (Signed)
Per secure chat Victor [name] per Dr. [name] (1) unit of blood - patient confirmed ?

## 2022-01-26 ENCOUNTER — Other Ambulatory Visit: Payer: Self-pay | Admitting: *Deleted

## 2022-01-26 ENCOUNTER — Encounter: Payer: Self-pay | Admitting: Hematology & Oncology

## 2022-01-26 DIAGNOSIS — C9501 Acute leukemia of unspecified cell type, in remission: Secondary | ICD-10-CM

## 2022-01-26 DIAGNOSIS — M5412 Radiculopathy, cervical region: Secondary | ICD-10-CM

## 2022-01-26 DIAGNOSIS — C9500 Acute leukemia of unspecified cell type not having achieved remission: Secondary | ICD-10-CM

## 2022-01-26 DIAGNOSIS — M5416 Radiculopathy, lumbar region: Secondary | ICD-10-CM

## 2022-01-26 LAB — TYPE AND SCREEN
ABO/RH(D): A POS
Antibody Screen: NEGATIVE
Unit division: 0

## 2022-01-26 LAB — BPAM RBC
Blood Product Expiration Date: 202304162359
ISSUE DATE / TIME: 202303231257
Unit Type and Rh: 6200

## 2022-01-29 ENCOUNTER — Telehealth: Payer: Self-pay | Admitting: *Deleted

## 2022-01-29 ENCOUNTER — Other Ambulatory Visit: Payer: Self-pay

## 2022-01-29 ENCOUNTER — Inpatient Hospital Stay: Payer: Medicare Other

## 2022-01-29 DIAGNOSIS — M5416 Radiculopathy, lumbar region: Secondary | ICD-10-CM

## 2022-01-29 DIAGNOSIS — M5412 Radiculopathy, cervical region: Secondary | ICD-10-CM

## 2022-01-29 DIAGNOSIS — C9501 Acute leukemia of unspecified cell type, in remission: Secondary | ICD-10-CM

## 2022-01-29 DIAGNOSIS — C9500 Acute leukemia of unspecified cell type not having achieved remission: Secondary | ICD-10-CM

## 2022-01-29 LAB — CBC WITH DIFFERENTIAL (CANCER CENTER ONLY)
Abs Immature Granulocytes: 0 10*3/uL (ref 0.00–0.07)
Basophils Absolute: 0 10*3/uL (ref 0.0–0.1)
Basophils Relative: 0 %
Eosinophils Absolute: 0 10*3/uL (ref 0.0–0.5)
Eosinophils Relative: 2 %
HCT: 30.5 % — ABNORMAL LOW (ref 39.0–52.0)
Hemoglobin: 9.5 g/dL — ABNORMAL LOW (ref 13.0–17.0)
Lymphocytes Relative: 50 %
Lymphs Abs: 1.2 10*3/uL (ref 0.7–4.0)
MCH: 28.9 pg (ref 26.0–34.0)
MCHC: 31.1 g/dL (ref 30.0–36.0)
MCV: 92.7 fL (ref 80.0–100.0)
Monocytes Absolute: 0.8 10*3/uL (ref 0.1–1.0)
Monocytes Relative: 34 %
Neutro Abs: 0.3 10*3/uL — CL (ref 1.7–7.7)
Neutrophils Relative %: 14 %
Platelet Count: 34 10*3/uL — ABNORMAL LOW (ref 150–400)
RBC: 3.29 MIL/uL — ABNORMAL LOW (ref 4.22–5.81)
RDW: 19.6 % — ABNORMAL HIGH (ref 11.5–15.5)
Smear Review: NORMAL
WBC Count: 2.3 10*3/uL — ABNORMAL LOW (ref 4.0–10.5)
nRBC: 1.3 % — ABNORMAL HIGH (ref 0.0–0.2)

## 2022-01-29 LAB — SAMPLE TO BLOOD BANK

## 2022-01-29 LAB — CMP (CANCER CENTER ONLY)
ALT: 5 U/L (ref 0–44)
AST: 9 U/L — ABNORMAL LOW (ref 15–41)
Albumin: 3.7 g/dL (ref 3.5–5.0)
Alkaline Phosphatase: 55 U/L (ref 38–126)
Anion gap: 7 (ref 5–15)
BUN: 14 mg/dL (ref 8–23)
CO2: 27 mmol/L (ref 22–32)
Calcium: 8.5 mg/dL — ABNORMAL LOW (ref 8.9–10.3)
Chloride: 104 mmol/L (ref 98–111)
Creatinine: 1.17 mg/dL (ref 0.61–1.24)
GFR, Estimated: >60 mL/min (ref >60)
Glucose, Bld: 109 mg/dL — ABNORMAL HIGH (ref 70–99)
Potassium: 3.9 mmol/L (ref 3.5–5.1)
Sodium: 138 mmol/L (ref 135–145)
Total Bilirubin: 0.7 mg/dL (ref 0.3–1.2)
Total Protein: 5.4 g/dL — ABNORMAL LOW (ref 6.5–8.1)

## 2022-01-29 LAB — MAGNESIUM: Magnesium: 2.1 mg/dL (ref 1.7–2.4)

## 2022-01-29 MED ORDER — SODIUM CHLORIDE 0.9 % IV SOLN: Freq: Once | INTRAVENOUS | Status: AC

## 2022-01-29 NOTE — Telephone Encounter (Signed)
Dr. Ennever notified of ANC-0.3.  No new orders received at this time.  

## 2022-01-29 NOTE — Patient Instructions (Signed)

## 2022-01-29 NOTE — Patient Instructions (Signed)

## 2022-01-31 ENCOUNTER — Other Ambulatory Visit: Payer: Self-pay | Admitting: *Deleted

## 2022-01-31 DIAGNOSIS — C9500 Acute leukemia of unspecified cell type not having achieved remission: Secondary | ICD-10-CM

## 2022-01-31 DIAGNOSIS — M5416 Radiculopathy, lumbar region: Secondary | ICD-10-CM

## 2022-01-31 DIAGNOSIS — C9501 Acute leukemia of unspecified cell type, in remission: Secondary | ICD-10-CM

## 2022-02-01 ENCOUNTER — Inpatient Hospital Stay: Payer: Medicare Other

## 2022-02-01 DIAGNOSIS — C9500 Acute leukemia of unspecified cell type not having achieved remission: Secondary | ICD-10-CM | POA: Diagnosis not present

## 2022-02-01 DIAGNOSIS — S2231XA Fracture of one rib, right side, initial encounter for closed fracture: Secondary | ICD-10-CM

## 2022-02-01 DIAGNOSIS — M5412 Radiculopathy, cervical region: Secondary | ICD-10-CM

## 2022-02-01 DIAGNOSIS — Z452 Encounter for adjustment and management of vascular access device: Secondary | ICD-10-CM

## 2022-02-01 DIAGNOSIS — M5416 Radiculopathy, lumbar region: Secondary | ICD-10-CM

## 2022-02-01 DIAGNOSIS — C9501 Acute leukemia of unspecified cell type, in remission: Secondary | ICD-10-CM

## 2022-02-01 LAB — CBC WITH DIFFERENTIAL (CANCER CENTER ONLY)
Abs Immature Granulocytes: 0 10*3/uL (ref 0.00–0.07)
Basophils Absolute: 0 10*3/uL (ref 0.0–0.1)
Basophils Relative: 1 %
Blasts: 7 %
Eosinophils Absolute: 0.1 10*3/uL (ref 0.0–0.5)
Eosinophils Relative: 3 %
HCT: 30.9 % — ABNORMAL LOW (ref 39.0–52.0)
Hemoglobin: 9.2 g/dL — ABNORMAL LOW (ref 13.0–17.0)
Lymphocytes Relative: 38 %
Lymphs Abs: 1.1 10*3/uL (ref 0.7–4.0)
MCH: 27.7 pg (ref 26.0–34.0)
MCHC: 29.8 g/dL — ABNORMAL LOW (ref 30.0–36.0)
MCV: 93.1 fL (ref 80.0–100.0)
Monocytes Absolute: 1.2 10*3/uL — ABNORMAL HIGH (ref 0.1–1.0)
Monocytes Relative: 41 %
Neutro Abs: 0.3 10*3/uL — CL (ref 1.7–7.7)
Neutrophils Relative %: 10 %
Platelet Count: 30 10*3/uL — ABNORMAL LOW (ref 150–400)
RBC: 3.32 MIL/uL — ABNORMAL LOW (ref 4.22–5.81)
RDW: 19.5 % — ABNORMAL HIGH (ref 11.5–15.5)
WBC Count: 2.9 10*3/uL — ABNORMAL LOW (ref 4.0–10.5)
nRBC: 0.7 % — ABNORMAL HIGH (ref 0.0–0.2)

## 2022-02-01 LAB — CMP (CANCER CENTER ONLY)
ALT: 6 U/L (ref 0–44)
AST: 9 U/L — ABNORMAL LOW (ref 15–41)
Albumin: 3.6 g/dL (ref 3.5–5.0)
Alkaline Phosphatase: 62 U/L (ref 38–126)
Anion gap: 6 (ref 5–15)
BUN: 10 mg/dL (ref 8–23)
CO2: 27 mmol/L (ref 22–32)
Calcium: 8.5 mg/dL — ABNORMAL LOW (ref 8.9–10.3)
Chloride: 106 mmol/L (ref 98–111)
Creatinine: 1.05 mg/dL (ref 0.61–1.24)
GFR, Estimated: >60 mL/min (ref >60)
Glucose, Bld: 94 mg/dL (ref 70–99)
Potassium: 4.1 mmol/L (ref 3.5–5.1)
Sodium: 139 mmol/L (ref 135–145)
Total Bilirubin: 0.6 mg/dL (ref 0.3–1.2)
Total Protein: 5.3 g/dL — ABNORMAL LOW (ref 6.5–8.1)

## 2022-02-01 LAB — SAMPLE TO BLOOD BANK

## 2022-02-01 MED ORDER — HEPARIN SOD (PORK) LOCK FLUSH 100 UNIT/ML IV SOLN
500.0000 [IU] | Freq: Once | INTRAVENOUS | Status: AC
  Administered 2022-02-01: 500 [IU] via INTRAVENOUS

## 2022-02-01 MED ORDER — SODIUM CHLORIDE 0.9% FLUSH
10.0000 mL | Freq: Once | INTRAVENOUS | Status: AC
  Administered 2022-02-01: 10 mL via INTRAVENOUS

## 2022-02-01 MED ORDER — KETOROLAC TROMETHAMINE 15 MG/ML IJ SOLN
30.0000 mg | Freq: Once | INTRAMUSCULAR | Status: AC
  Administered 2022-02-01: 30 mg via INTRAVENOUS
  Filled 2022-02-01: qty 2

## 2022-02-01 NOTE — Patient Instructions (Signed)

## 2022-02-01 NOTE — Addendum Note (Signed)
Addended by: Fabio Neighbors A on: 02/01/2022 10:52 AM ? ? Modules accepted: Orders ? ?

## 2022-02-01 NOTE — Addendum Note (Signed)
Addended by: Fabio Neighbors A on: 02/01/2022 11:14 AM ? ? Modules accepted: Orders ? ?

## 2022-02-02 ENCOUNTER — Other Ambulatory Visit: Payer: Self-pay | Admitting: *Deleted

## 2022-02-02 DIAGNOSIS — Z452 Encounter for adjustment and management of vascular access device: Secondary | ICD-10-CM

## 2022-02-02 DIAGNOSIS — S2231XA Fracture of one rib, right side, initial encounter for closed fracture: Secondary | ICD-10-CM

## 2022-02-02 DIAGNOSIS — C9501 Acute leukemia of unspecified cell type, in remission: Secondary | ICD-10-CM

## 2022-02-02 DIAGNOSIS — M5416 Radiculopathy, lumbar region: Secondary | ICD-10-CM

## 2022-02-02 DIAGNOSIS — M5412 Radiculopathy, cervical region: Secondary | ICD-10-CM

## 2022-02-05 ENCOUNTER — Other Ambulatory Visit: Payer: Self-pay

## 2022-02-05 ENCOUNTER — Inpatient Hospital Stay: Payer: Medicare Other

## 2022-02-05 ENCOUNTER — Inpatient Hospital Stay: Payer: Medicare Other | Attending: Oncology

## 2022-02-05 ENCOUNTER — Encounter: Payer: Self-pay | Admitting: Hematology & Oncology

## 2022-02-05 ENCOUNTER — Other Ambulatory Visit: Payer: Self-pay | Admitting: *Deleted

## 2022-02-05 ENCOUNTER — Inpatient Hospital Stay (HOSPITAL_BASED_OUTPATIENT_CLINIC_OR_DEPARTMENT_OTHER): Payer: Medicare Other | Admitting: Hematology & Oncology

## 2022-02-05 ENCOUNTER — Telehealth: Payer: Self-pay | Admitting: *Deleted

## 2022-02-05 VITALS — BP 97/48 | HR 95 | Temp 98.4°F | Resp 18 | Wt 205.0 lb

## 2022-02-05 DIAGNOSIS — C9502 Acute leukemia of unspecified cell type, in relapse: Secondary | ICD-10-CM

## 2022-02-05 DIAGNOSIS — S2231XA Fracture of one rib, right side, initial encounter for closed fracture: Secondary | ICD-10-CM

## 2022-02-05 DIAGNOSIS — R197 Diarrhea, unspecified: Secondary | ICD-10-CM | POA: Diagnosis not present

## 2022-02-05 DIAGNOSIS — C9200 Acute myeloblastic leukemia, not having achieved remission: Secondary | ICD-10-CM | POA: Insufficient documentation

## 2022-02-05 DIAGNOSIS — Z7901 Long term (current) use of anticoagulants: Secondary | ICD-10-CM | POA: Insufficient documentation

## 2022-02-05 DIAGNOSIS — I2699 Other pulmonary embolism without acute cor pulmonale: Secondary | ICD-10-CM | POA: Insufficient documentation

## 2022-02-05 DIAGNOSIS — Z452 Encounter for adjustment and management of vascular access device: Secondary | ICD-10-CM

## 2022-02-05 DIAGNOSIS — Z880 Allergy status to penicillin: Secondary | ICD-10-CM | POA: Diagnosis not present

## 2022-02-05 DIAGNOSIS — N289 Disorder of kidney and ureter, unspecified: Secondary | ICD-10-CM | POA: Insufficient documentation

## 2022-02-05 DIAGNOSIS — M5416 Radiculopathy, lumbar region: Secondary | ICD-10-CM

## 2022-02-05 DIAGNOSIS — R195 Other fecal abnormalities: Secondary | ICD-10-CM | POA: Insufficient documentation

## 2022-02-05 DIAGNOSIS — C9501 Acute leukemia of unspecified cell type, in remission: Secondary | ICD-10-CM

## 2022-02-05 DIAGNOSIS — E860 Dehydration: Secondary | ICD-10-CM | POA: Insufficient documentation

## 2022-02-05 DIAGNOSIS — Z881 Allergy status to other antibiotic agents status: Secondary | ICD-10-CM | POA: Diagnosis not present

## 2022-02-05 DIAGNOSIS — Z79899 Other long term (current) drug therapy: Secondary | ICD-10-CM | POA: Diagnosis not present

## 2022-02-05 DIAGNOSIS — M5412 Radiculopathy, cervical region: Secondary | ICD-10-CM

## 2022-02-05 DIAGNOSIS — C9500 Acute leukemia of unspecified cell type not having achieved remission: Secondary | ICD-10-CM | POA: Diagnosis present

## 2022-02-05 LAB — CBC WITH DIFFERENTIAL (CANCER CENTER ONLY)
Abs Immature Granulocytes: 0.2 10*3/uL — ABNORMAL HIGH (ref 0.00–0.07)
Basophils Absolute: 0 10*3/uL (ref 0.0–0.1)
Basophils Relative: 0 %
Blasts: 12 %
Eosinophils Absolute: 0.1 10*3/uL (ref 0.0–0.5)
Eosinophils Relative: 1 %
HCT: 29.5 % — ABNORMAL LOW (ref 39.0–52.0)
Hemoglobin: 9.1 g/dL — ABNORMAL LOW (ref 13.0–17.0)
Lymphocytes Relative: 38 %
Lymphs Abs: 2.1 10*3/uL (ref 0.7–4.0)
MCH: 28.1 pg (ref 26.0–34.0)
MCHC: 30.8 g/dL (ref 30.0–36.0)
MCV: 91 fL (ref 80.0–100.0)
Metamyelocytes Relative: 2 %
Monocytes Absolute: 1.9 10*3/uL — ABNORMAL HIGH (ref 0.1–1.0)
Monocytes Relative: 36 %
Myelocytes: 1 %
Neutro Abs: 0.5 10*3/uL — ABNORMAL LOW (ref 1.7–7.7)
Neutrophils Relative %: 10 %
Platelet Count: 30 10*3/uL — ABNORMAL LOW (ref 150–400)
RBC: 3.24 MIL/uL — ABNORMAL LOW (ref 4.22–5.81)
RDW: 19.9 % — ABNORMAL HIGH (ref 11.5–15.5)
WBC Count: 5.4 10*3/uL (ref 4.0–10.5)
nRBC: 0.4 % — ABNORMAL HIGH (ref 0.0–0.2)

## 2022-02-05 LAB — CMP (CANCER CENTER ONLY)
ALT: 5 U/L (ref 0–44)
AST: 8 U/L — ABNORMAL LOW (ref 15–41)
Albumin: 3.4 g/dL — ABNORMAL LOW (ref 3.5–5.0)
Alkaline Phosphatase: 48 U/L (ref 38–126)
Anion gap: 11 (ref 5–15)
BUN: 51 mg/dL — ABNORMAL HIGH (ref 8–23)
CO2: 20 mmol/L — ABNORMAL LOW (ref 22–32)
Calcium: 8.5 mg/dL — ABNORMAL LOW (ref 8.9–10.3)
Chloride: 102 mmol/L (ref 98–111)
Creatinine: 3.27 mg/dL (ref 0.61–1.24)
GFR, Estimated: 18 mL/min — ABNORMAL LOW (ref >60)
Glucose, Bld: 110 mg/dL — ABNORMAL HIGH (ref 70–99)
Potassium: 4.2 mmol/L (ref 3.5–5.1)
Sodium: 133 mmol/L — ABNORMAL LOW (ref 135–145)
Total Bilirubin: 0.7 mg/dL (ref 0.3–1.2)
Total Protein: 5.2 g/dL — ABNORMAL LOW (ref 6.5–8.1)

## 2022-02-05 LAB — MAGNESIUM: Magnesium: 2.2 mg/dL (ref 1.7–2.4)

## 2022-02-05 MED ORDER — SODIUM CHLORIDE 0.9 % IV SOLN: INTRAVENOUS | Status: DC

## 2022-02-05 NOTE — Progress Notes (Signed)
?Hematology and Oncology Follow Up Visit ? ?Victor [name] ?[id number] ?Jun 15, 1936 86 y.o. ?02/05/2022 ? ? 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 ?Current Therapy:   ?Vidaza/Venetoclax.--  S/p cycle #11 ?Eliquis 2.5 mg p.o. twice daily --started on 03/16/2021 ?    ?Interim History:  Victor [name] is in for an unscheduled visit.  Unfortunately, I see that we have real problems right now.  I looked at his blood smear.  I easily see circulating blasts.  He has not had this before. ? ?He is having a lot of diarrhea.  He said that he felt really good last Thursday.  Afterwards, he began to have abdominal issues.  He is having diarrhea.  Is not eating most of the weekend.  He is not urinating much.  I suspect that he is quite dehydrated. ? ?I did do a rectal exam on Victor [name].  His stool is dark green and heme positive. ? ?He also has acute renal insufficiency.  His BUN is 51 creatinine 3.3.  This is also new for Victor [name].  This might be from dehydration.  He is given some fluids in the office. ? ?He just looks somewhat weaker.  This is a clear difference.  I really hate this for Victor [name].  I know he is done everything we have asked Victor [name] to do.  He is on the Vidaza and venetoclax. ? ?I told Victor [name] to stop the Eliquis. ? ?He required 3 people to help get Victor [name] up onto the examining table.  He just has never been that week.  Typically, he just walks into the office.   ? ?I think the underlying problem is this relapsed leukemia.  He is having a tough time right now. ? ?Overall, I would have said that his performance status is probably ECOG 2.     ? ? ?Medications:  ?Current Outpatient Medications:  ?  acetaminophen (TYLENOL) 500 MG tablet, Take 1,000 mg by mouth every 6 (six) hours as needed for moderate pain., Disp: , Rfl:  ?  AMBULATORY NON FORMULARY MEDICATION, Premier Protein Vanilla, Disp: 12 Bottle, Rfl: 2 ?  amoxicillin (AMOXIL) 500 MG tablet, Take 4 tablets  (2081m) 1 hour prior to dental procedure., Disp: 4 tablet, Rfl: 0 ?  apixaban (ELIQUIS) 2.5 MG TABS tablet, Take 1 tablet (2.5 mg total) by mouth 2 (two) times daily. (Patient not taking: Reported on 01/01/2022), Disp: 60 tablet, Rfl: 6 ?  doxazosin (CARDURA) 2 MG tablet, Take 1 tablet (2 mg total) by mouth at bedtime., Disp: 90 tablet, Rfl: 3 ?  gabapentin (NEURONTIN) 300 MG capsule, Take 2 capsules (600 mg total) by mouth 3 (three) times daily., Disp: 240 capsule, Rfl: 4 ?  HYDROcodone-acetaminophen (NORCO/VICODIN) 5-325 MG tablet, Take 1 tablet by mouth every 6 (six) hours as needed for moderate pain., Disp: , Rfl:  ?  ipratropium (ATROVENT) 0.06 % nasal spray, Place 1 spray into both nostrils 2 (two) times daily as needed for rhinitis., Disp: , Rfl:  ?  ketoconazole (NIZORAL) 2 % shampoo, Apply 1 application topically 2 (two) times a week., Disp: , Rfl:  ?  levothyroxine (SYNTHROID) 150 MCG tablet, Take 1 tablet (150 mcg total) by mouth daily., Disp: 90 tablet, Rfl: 3 ?  Lidocaine 4 % PTCH, Apply 0.5 patches topically daily as needed (pain). Uses 1/2 patch daily., Disp: , Rfl:  ?  lidocaine-prilocaine (EMLA) cream, Apply 1 application topically as needed for up to 30 doses. (Patient taking  differently: Apply 1 application topically daily as needed (port access).), Disp: 30 g, Rfl: 0 ?  loratadine (CLARITIN) 10 MG tablet, Take 10 mg by mouth daily as needed (allergies). Allertec, Disp: , Rfl:  ?  Melatonin 5 MG CHEW, Chew 5 mg by mouth at bedtime., Disp: , Rfl:  ?  omeprazole (PRILOSEC) 20 MG capsule, Take 1 capsule by mouth as needed., Disp: , Rfl:  ?  Polyethyl Glycol-Propyl Glycol (DRY EYE RELIEF) 0.4-0.3 % GEL ophthalmic gel, 1 application 4 (four) times daily as needed (dry eyes)., Disp: , Rfl:  ?  potassium chloride (MICRO-K) 10 MEQ CR capsule, Take 10 mEq by mouth., Disp: , Rfl:  ?  propranolol ER (INDERAL LA) 80 MG 24 hr capsule, TAKE ONE CAPSULE BY MOUTH ONE TIME DAILY, Disp: 90 capsule, Rfl: 0 ?   simvastatin (ZOCOR) 40 MG tablet, TAKE ONE TABLET BY MOUTH DAILY AT BEDTIME (Patient taking differently: Take 40 mg by mouth daily at 6 PM.), Disp: 90 tablet, Rfl: 3 ?  venetoclax 100 MG TABS, Take 400 mg by mouth daily. Takes 400 mg daily for 1 weeks and stops for 5 weeks., Disp: , Rfl:  ?No current facility-administered medications for this visit. ? ?Facility-Administered Medications Ordered in Other Visits:  ?  0.9 %  sodium chloride infusion, , Intravenous, Continuous, Adalin Vanderploeg, Rudell Cobb, MD, Last Rate: 500 mL/hr at 02/05/22 1005, New Bag at 02/05/22 1005 ?  sodium chloride flush (NS) 0.9 % injection 10 mL, 10 mL, Intravenous, PRN, Volanda Napoleon, MD, 10 mL at 01/01/22 1120 ? ?Allergies:  ?Allergies  ?Allergen Reactions  ? Augmentin [Amoxicillin-Pot Clavulanate] Diarrhea  ?  SOB  ? ? ?Past Medical History, Surgical history, Social history, and Family History were reviewed and updated. ? ?Review of Systems: ?Review of Systems  ?Constitutional: Negative.   ?HENT:  Negative.    ?Eyes: Negative.   ?Respiratory: Negative.    ?Cardiovascular: Negative.   ?Gastrointestinal: Negative.   ?Endocrine: Negative.   ?Genitourinary: Negative.    ?Musculoskeletal: Negative.   ?Skin: Negative.   ?Neurological: Negative.   ?Hematological: Negative.   ?Psychiatric/Behavioral: Negative.    ? ?Physical Exam: ? weight is 205 lb (93 kg). His oral temperature is 98.4 ?F (36.9 ?C). His blood pressure is 97/48 (abnormal) and his pulse is 95. His respiration is 18 and oxygen saturation is 94%.  ? ?Wt Readings from Last 3 Encounters:  ?02/05/22 205 lb (93 kg)  ?01/18/22 214 lb (97.1 kg)  ?01/01/22 215 lb (97.5 kg)  ? ? ?Physical Exam ?Vitals reviewed.  ?HENT:  ?   Head: Normocephalic and atraumatic.  ?Eyes:  ?   Pupils: Pupils are equal, round, and reactive to light.  ?Cardiovascular:  ?   Rate and Rhythm: Normal rate and regular rhythm.  ?   Heart sounds: Normal heart sounds.  ?Pulmonary:  ?   Effort: Pulmonary effort is normal.  ?    Breath sounds: Normal breath sounds.  ?Abdominal:  ?   General: Bowel sounds are normal.  ?   Palpations: Abdomen is soft.  ?Musculoskeletal:     ?   General: No tenderness or deformity. Normal range of motion.  ?   Cervical back: Normal range of motion.  ?Lymphadenopathy:  ?   Cervical: No cervical adenopathy.  ?Skin: ?   General: Skin is warm and dry.  ?   Findings: No erythema or rash.  ?Neurological:  ?   Mental Status: He is alert and oriented to person, place, and  time.  ?Psychiatric:     ?   Behavior: Behavior normal.     ?   Thought Content: Thought content normal.     ?   Judgment: Judgment normal.  ? ? ? ?Lab Results  ?Component Value Date  ? WBC 5.4 02/05/2022  ? HGB 9.1 (L) 02/05/2022  ? HCT 29.5 (L) 02/05/2022  ? MCV 91.0 02/05/2022  ? PLT 30 (L) 02/05/2022  ? ?  Chemistry   ?   ?Component Value Date/Time  ? NA 133 (L) 02/05/2022 1000  ? NA 140 11/22/2021 1117  ? NA 142 03/30/2015 1149  ? K 4.2 02/05/2022 1000  ? K 4.5 03/30/2015 1149  ? CL 102 02/05/2022 1000  ? CO2 20 (L) 02/05/2022 1000  ? CO2 25 03/30/2015 1149  ? BUN 51 (H) 02/05/2022 1000  ? BUN 8 11/22/2021 1117  ? BUN 17.1 03/30/2015 1149  ? CREATININE 3.27 (HH) 02/05/2022 1000  ? CREATININE 1.2 03/30/2015 1149  ?    ?Component Value Date/Time  ? CALCIUM 8.5 (L) 02/05/2022 1000  ? CALCIUM 8.7 03/30/2015 1149  ? ALKPHOS 48 02/05/2022 1000  ? AST 8 (L) 02/05/2022 1000  ? ALT 5 02/05/2022 1000  ? BILITOT 0.7 02/05/2022 1000  ?  ? ? ? ?Impression and Plan: ?Victor [name] is a very nice 86 year old white male who has acute myeloid leukemia.  He does have a adverse prognostic feature with respect to the FLT3 mutation.   ? ?Unfortunately, he clearly has relapsed.  The blood smear I think is significant for this. ? ?We did speak with his leukemia specialist that [place].  He will go ahead and get Victor [name] admitted over there.  I am sure he will need to have a bone marrow biopsy done. ? ?As far as therapy, given the fact that he  does have the FLT3 mutation, there might be targeted therapy can be utilized.  I suspect he probably will need to have somewhat of an aggressive induction protocol. ? ?Given his age, he actually has done incredib

## 2022-02-05 NOTE — Patient Instructions (Signed)

## 2022-02-05 NOTE — Telephone Encounter (Signed)
Lab reported critical Absolute neutrophil of .5, Platelets 30,000, Creatinine of 3.46.  Dr [name] notified.  Patient in exam room to be seen.  Patient to be admitted to leukemia service at [facility].  Dr Linus Orn called and discussed with Dr [name] ?

## 2022-02-05 NOTE — Patient Instructions (Signed)

## 2022-02-09 ENCOUNTER — Other Ambulatory Visit: Payer: Self-pay | Admitting: *Deleted

## 2022-02-09 DIAGNOSIS — C9501 Acute leukemia of unspecified cell type, in remission: Secondary | ICD-10-CM

## 2022-02-09 DIAGNOSIS — C9502 Acute leukemia of unspecified cell type, in relapse: Secondary | ICD-10-CM

## 2022-02-11 ENCOUNTER — Other Ambulatory Visit: Payer: Self-pay | Admitting: Oncology

## 2022-02-12 ENCOUNTER — Inpatient Hospital Stay: Payer: Medicare Other

## 2022-02-12 ENCOUNTER — Other Ambulatory Visit: Payer: Self-pay | Admitting: Hematology & Oncology

## 2022-02-12 ENCOUNTER — Inpatient Hospital Stay: Payer: Medicare Other | Admitting: Hematology & Oncology

## 2022-02-13 ENCOUNTER — Ambulatory Visit: Payer: Medicare Other

## 2022-02-14 ENCOUNTER — Ambulatory Visit: Payer: Medicare Other

## 2022-02-15 ENCOUNTER — Inpatient Hospital Stay: Payer: Medicare Other

## 2022-02-15 ENCOUNTER — Telehealth: Payer: Self-pay

## 2022-02-15 ENCOUNTER — Ambulatory Visit: Payer: Medicare Other

## 2022-02-15 ENCOUNTER — Other Ambulatory Visit: Payer: Self-pay

## 2022-02-15 DIAGNOSIS — C9502 Acute leukemia of unspecified cell type, in relapse: Secondary | ICD-10-CM

## 2022-02-15 DIAGNOSIS — C9200 Acute myeloblastic leukemia, not having achieved remission: Secondary | ICD-10-CM | POA: Diagnosis not present

## 2022-02-15 DIAGNOSIS — C9501 Acute leukemia of unspecified cell type, in remission: Secondary | ICD-10-CM

## 2022-02-15 LAB — SAMPLE TO BLOOD BANK

## 2022-02-15 LAB — CBC WITH DIFFERENTIAL (CANCER CENTER ONLY)
Abs Immature Granulocytes: 0 10*3/uL (ref 0.00–0.07)
Basophils Absolute: 0 10*3/uL (ref 0.0–0.1)
Basophils Relative: 0 %
Blasts: 4 %
Eosinophils Absolute: 0 10*3/uL (ref 0.0–0.5)
Eosinophils Relative: 1 %
HCT: 30.3 % — ABNORMAL LOW (ref 39.0–52.0)
Hemoglobin: 9.5 g/dL — ABNORMAL LOW (ref 13.0–17.0)
Lymphocytes Relative: 43 %
Lymphs Abs: 1.9 10*3/uL (ref 0.7–4.0)
MCH: 28.5 pg (ref 26.0–34.0)
MCHC: 31.4 g/dL (ref 30.0–36.0)
MCV: 91 fL (ref 80.0–100.0)
Monocytes Absolute: 1.6 10*3/uL — ABNORMAL HIGH (ref 0.1–1.0)
Monocytes Relative: 36 %
Neutro Abs: 0.7 10*3/uL — ABNORMAL LOW (ref 1.7–7.7)
Neutrophils Relative %: 16 %
Platelet Count: 23 10*3/uL — ABNORMAL LOW (ref 150–400)
RBC: 3.33 MIL/uL — ABNORMAL LOW (ref 4.22–5.81)
RDW: 18.1 % — ABNORMAL HIGH (ref 11.5–15.5)
WBC Count: 4.4 10*3/uL (ref 4.0–10.5)
nRBC: 0 % (ref 0.0–0.2)

## 2022-02-15 LAB — CMP (CANCER CENTER ONLY)
ALT: 11 U/L (ref 0–44)
AST: 17 U/L (ref 15–41)
Albumin: 3.1 g/dL — ABNORMAL LOW (ref 3.5–5.0)
Alkaline Phosphatase: 45 U/L (ref 38–126)
Anion gap: 9 (ref 5–15)
BUN: 8 mg/dL (ref 8–23)
CO2: 26 mmol/L (ref 22–32)
Calcium: 6.2 mg/dL — CL (ref 8.9–10.3)
Chloride: 105 mmol/L (ref 98–111)
Creatinine: 1.22 mg/dL (ref 0.61–1.24)
GFR, Estimated: 59 mL/min — ABNORMAL LOW (ref >60)
Glucose, Bld: 138 mg/dL — ABNORMAL HIGH (ref 70–99)
Potassium: 3.3 mmol/L — ABNORMAL LOW (ref 3.5–5.1)
Sodium: 140 mmol/L (ref 135–145)
Total Bilirubin: 0.4 mg/dL (ref 0.3–1.2)
Total Protein: 4.6 g/dL — ABNORMAL LOW (ref 6.5–8.1)

## 2022-02-15 LAB — MAGNESIUM: Magnesium: 1.4 mg/dL — ABNORMAL LOW (ref 1.7–2.4)

## 2022-02-15 MED ORDER — SODIUM CHLORIDE 0.9 % IV SOLN
2.0000 g | Freq: Once | INTRAVENOUS | Status: AC
  Administered 2022-02-15: 2 g via INTRAVENOUS
  Filled 2022-02-15: qty 20

## 2022-02-15 MED ORDER — SODIUM CHLORIDE 0.9 % IV SOLN: INTRAVENOUS | Status: DC

## 2022-02-15 NOTE — Patient Instructions (Signed)
Hypocalcemia, Adult ?Hypocalcemia is when the level of calcium in a person's blood is below normal. Calcium is a mineral that is used by the body in many ways. Not having enough blood calcium can affect the nervous system. This can lead to problems with the muscles, the heart, and the brain. ?What are the causes? ?This condition may be caused by: ?A deficiency of vitamin D or magnesium or both. ?Decreased levels of parathyroid hormone (hypoparathyroidism). ?Kidney function problems. ?Low levels of a body protein called albumin. ?Inflammation of the pancreas (pancreatitis). ?Not taking in enough vitamins and minerals in the diet or having intestinal problems that interfere with absorption of nutrients. ?Certain medicines. ?What are the signs or symptoms? ?Symptoms of this condition may include: ?Numbness and tingling in the fingers, toes, or around the mouth. ?Muscle twitching, aches, or cramps, especially in the legs, feet, and back. ?Spasm of the voice box. This may make it difficult to breathe or speak. ?Fast heartbeats (palpitations) and abnormal heart rhythms (dysrhythmias). ?Shaking uncontrollably (seizures). ?Memory problems, confusion, or difficulty thinking. ?Depression, anxiety, irritability, or changes in personality. ?Long-term (chronic) symptoms of this condition may include: ?Coarse, brittle hair and nails. ?Dry skin or lasting skin diseases. These include psoriasis, eczema, or dermatitis. ?Dental cavities. ?Clouding of the eye lens (cataracts). ?Some people may not have any symptoms, especially if they have chronic hypocalcemia. ?How is this diagnosed? ?This condition is usually diagnosed with a blood test. You may also have other tests to help determine the underlying cause of the condition. This may include more blood tests and imaging tests. ?How is this treated? ?This condition may be treated with: ?Calcium given by mouth or through an IV. The method used for giving calcium will depend on the  severity of the condition. If your condition is severe, you may need to be closely monitored in the hospital. ?Giving other minerals (electrolytes), such as magnesium. ?Other treatment will depend on the cause of the condition. ?Follow these instructions at home: ?Follow diet instructions from your health care provider or dietitian. ?Take supplements only as told by your health care provider. Do not take an iron supplement within 2 hours of taking a calcium supplement. ?Keep all follow-up visits. This is important. ?Contact a health care provider if: ?You have increased muscle twitching or cramps. ?You have new swelling in the feet, ankles, or legs. ?You develop changes in mood, memory, or personality. ?Get help right away if: ?You have chest pain. ?You have persistent rapid or irregular heartbeats. ?You have trouble breathing. ?You faint. ?You start to have seizures. ?You have confusion. ?These symptoms may represent a serious problem that is an emergency. Do not wait to see if the symptoms will go away. Get medical help right away. Call your local emergency services (911 in the [place] Do not drive yourself to the hospital. ?Summary ?Hypocalcemia is when the level of calcium in a person's blood is below normal. Not having enough blood calcium can affect the nervous system. ?This condition may be treated with calcium given by mouth or through an IV, or by receiving other minerals. Other treatment depends on the cause of the condition. ?Take supplements only as told by your health care provider. ?Contact a health care provider if you have new or worsening symptoms. ?Keep all follow-up visits. This is important. ?This information is not intended to replace advice given to you by your health care provider. Make sure you discuss any questions you have with your health care provider. ?Document Revised: 03/29/2021  Document Reviewed: 03/29/2021 ?Elsevier Patient Education ? [place]. ? ?

## 2022-02-15 NOTE — Patient Instructions (Signed)

## 2022-02-15 NOTE — Telephone Encounter (Signed)
Critical lab result received from lab of calcium 6.2 Dr. [name] aware and orders received for patient to receive 2 grams of calcium IV while in clinic today.  ?

## 2022-02-16 ENCOUNTER — Ambulatory Visit: Payer: Medicare Other

## 2022-02-16 ENCOUNTER — Other Ambulatory Visit: Payer: Self-pay | Admitting: *Deleted

## 2022-02-16 DIAGNOSIS — C9502 Acute leukemia of unspecified cell type, in relapse: Secondary | ICD-10-CM

## 2022-02-16 DIAGNOSIS — C9501 Acute leukemia of unspecified cell type, in remission: Secondary | ICD-10-CM

## 2022-02-19 ENCOUNTER — Inpatient Hospital Stay: Payer: Medicare Other

## 2022-02-19 ENCOUNTER — Inpatient Hospital Stay (HOSPITAL_BASED_OUTPATIENT_CLINIC_OR_DEPARTMENT_OTHER): Payer: Medicare Other | Admitting: Hematology & Oncology

## 2022-02-19 ENCOUNTER — Encounter: Payer: Self-pay | Admitting: Hematology & Oncology

## 2022-02-19 ENCOUNTER — Other Ambulatory Visit: Payer: Self-pay | Admitting: Family

## 2022-02-19 ENCOUNTER — Telehealth: Payer: Self-pay | Admitting: *Deleted

## 2022-02-19 ENCOUNTER — Other Ambulatory Visit: Payer: Self-pay | Admitting: *Deleted

## 2022-02-19 VITALS — BP 132/53 | HR 73 | Temp 98.1°F | Resp 16 | Ht 70.0 in | Wt 214.0 lb

## 2022-02-19 DIAGNOSIS — D696 Thrombocytopenia, unspecified: Secondary | ICD-10-CM

## 2022-02-19 DIAGNOSIS — C9502 Acute leukemia of unspecified cell type, in relapse: Secondary | ICD-10-CM

## 2022-02-19 DIAGNOSIS — C9501 Acute leukemia of unspecified cell type, in remission: Secondary | ICD-10-CM

## 2022-02-19 DIAGNOSIS — C9200 Acute myeloblastic leukemia, not having achieved remission: Secondary | ICD-10-CM | POA: Diagnosis not present

## 2022-02-19 LAB — CBC WITH DIFFERENTIAL (CANCER CENTER ONLY)
Abs Immature Granulocytes: 1.8 10*3/uL — ABNORMAL HIGH (ref 0.00–0.07)
Band Neutrophils: 2 %
Basophils Absolute: 0.1 10*3/uL (ref 0.0–0.1)
Basophils Relative: 2 %
Eosinophils Absolute: 0.1 10*3/uL (ref 0.0–0.5)
Eosinophils Relative: 3 %
HCT: 29.8 % — ABNORMAL LOW (ref 39.0–52.0)
Hemoglobin: 9.3 g/dL — ABNORMAL LOW (ref 13.0–17.0)
Lymphocytes Relative: 40 %
Lymphs Abs: 1.7 10*3/uL (ref 0.7–4.0)
MCH: 28.6 pg (ref 26.0–34.0)
MCHC: 31.2 g/dL (ref 30.0–36.0)
MCV: 91.7 fL (ref 80.0–100.0)
Metamyelocytes Relative: 10 %
Monocytes Absolute: 0.1 10*3/uL (ref 0.1–1.0)
Monocytes Relative: 2 %
Myelocytes: 31 %
Neutro Abs: 0.5 10*3/uL — ABNORMAL LOW (ref 1.7–7.7)
Neutrophils Relative %: 10 %
Platelet Count: 22 10*3/uL — ABNORMAL LOW (ref 150–400)
RBC: 3.25 MIL/uL — ABNORMAL LOW (ref 4.22–5.81)
RDW: 18.6 % — ABNORMAL HIGH (ref 11.5–15.5)
Smear Review: NORMAL
WBC Count: 4.3 10*3/uL (ref 4.0–10.5)
nRBC: 0.5 % — ABNORMAL HIGH (ref 0.0–0.2)

## 2022-02-19 LAB — CMP (CANCER CENTER ONLY)
ALT: 15 U/L (ref 0–44)
AST: 22 U/L (ref 15–41)
Albumin: 3.1 g/dL — ABNORMAL LOW (ref 3.5–5.0)
Alkaline Phosphatase: 52 U/L (ref 38–126)
Anion gap: 8 (ref 5–15)
BUN: 9 mg/dL (ref 8–23)
CO2: 29 mmol/L (ref 22–32)
Calcium: 6.7 mg/dL — ABNORMAL LOW (ref 8.9–10.3)
Chloride: 105 mmol/L (ref 98–111)
Creatinine: 1.17 mg/dL (ref 0.61–1.24)
GFR, Estimated: >60 mL/min (ref >60)
Glucose, Bld: 112 mg/dL — ABNORMAL HIGH (ref 70–99)
Potassium: 3.8 mmol/L (ref 3.5–5.1)
Sodium: 142 mmol/L (ref 135–145)
Total Bilirubin: 0.4 mg/dL (ref 0.3–1.2)
Total Protein: 4.9 g/dL — ABNORMAL LOW (ref 6.5–8.1)

## 2022-02-19 LAB — MAGNESIUM: Magnesium: 1.6 mg/dL — ABNORMAL LOW (ref 1.7–2.4)

## 2022-02-19 MED ORDER — SODIUM CHLORIDE 0.9% FLUSH
10.0000 mL | INTRAVENOUS | Status: AC | PRN
  Administered 2022-02-19: 10 mL

## 2022-02-19 MED ORDER — MAGNESIUM SULFATE 2 GM/50ML IV SOLN
2.0000 g | Freq: Once | INTRAVENOUS | Status: AC
  Administered 2022-02-19: 2 g via INTRAVENOUS
  Filled 2022-02-19: qty 50

## 2022-02-19 MED ORDER — DIPHENHYDRAMINE HCL 25 MG PO CAPS: 25.0000 mg | ORAL_CAPSULE | Freq: Once | ORAL | Status: DC

## 2022-02-19 MED ORDER — HEPARIN SOD (PORK) LOCK FLUSH 100 UNIT/ML IV SOLN
250.0000 [IU] | INTRAVENOUS | Status: AC | PRN
  Administered 2022-02-19: 250 [IU]

## 2022-02-19 MED ORDER — ACETAMINOPHEN 325 MG PO TABS: 650.0000 mg | ORAL_TABLET | Freq: Once | ORAL | Status: DC

## 2022-02-19 MED ORDER — SODIUM CHLORIDE 0.9% IV SOLUTION: 250.0000 mL | Freq: Once | INTRAVENOUS | Status: DC

## 2022-02-19 NOTE — Telephone Encounter (Signed)
Platelets of 22,000.  Dr [name] seeing patient in office today.  Would like to transfuse platelets.  Orders placed.   ?

## 2022-02-19 NOTE — Patient Instructions (Signed)

## 2022-02-19 NOTE — Patient Instructions (Signed)
Magnesium Sulfate Injection What is this medication? MAGNESIUM SULFATE (mag NEE zee um SUL fate) prevents and treats low levels of magnesium in your body. It may also be used to prevent and treat seizures during pregnancy in people with high blood pressure disorders, such as preeclampsia or eclampsia. Magnesium plays an important role in maintaining the health of your muscles and nervous system. This medicine may be used for other purposes; ask your health care provider or pharmacist if you have questions. What should I tell my care team before I take this medication? They need to know if you have any of these conditions: Heart disease History of irregular heart beat Kidney disease An unusual or allergic reaction to magnesium sulfate, medications, foods, dyes, or preservatives Pregnant or trying to get pregnant Breast-feeding How should I use this medication? This medication is for infusion into a vein. It is given in a hospital or clinic setting. Talk to your care team about the use of this medication in children. While this medication may be prescribed for selected conditions, precautions do apply. Overdosage: If you think you have taken too much of this medicine contact a poison control center or emergency room at once. NOTE: This medicine is only for you. Do not share this medicine with others. What if I miss a dose? This does not apply. What may interact with this medication? Certain medications for anxiety or sleep Certain medications for seizures like phenobarbital Digoxin Medications that relax muscles for surgery Narcotic medications for pain This list may not describe all possible interactions. Give your health care provider a list of all the medicines, herbs, non-prescription drugs, or dietary supplements you use. Also tell them if you smoke, drink alcohol, or use illegal drugs. Some items may interact with your medicine. What should I watch for while using this medication? Your  condition will be monitored carefully while you are receiving this medication. You may need blood work done while you are receiving this medication. What side effects may I notice from receiving this medication? Side effects that you should report to your care team as soon as possible: Allergic reactions--skin rash, itching, hives, swelling of the face, lips, tongue, or throat High magnesium level--confusion, drowsiness, facial flushing, redness, sweating, muscle weakness, fast or irregular heartbeat, trouble breathing Low blood pressure--dizziness, feeling faint or lightheaded, blurry vision Side effects that usually do not require medical attention (report to your care team if they continue or are bothersome): Headache Nausea This list may not describe all possible side effects. Call your doctor for medical advice about side effects. You may report side effects to FDA at 1-800-FDA-1088. Where should I keep my medication? This medication is given in a hospital or clinic and will not be stored at home. NOTE: This sheet is a summary. It may not cover all possible information. If you have questions about this medicine, talk to your doctor, pharmacist, or health care provider.  2023 Elsevier/Gold Standard (2021-01-05 00:00:00)  

## 2022-02-19 NOTE — Progress Notes (Signed)
?Hematology and Oncology Follow Up Visit ? ?Victor [name] ?[id number] ?Jun 26, 1936 86 y.o. ?02/19/2022 ? ? 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 ?Current Therapy:   ?Vidaza/Venetoclax.--  S/p cycle #11 -- d/c on 02/05/2022 ?Gilterifinib 40 mg po q day -- start on 02/10/2022-- d/c on 02/06/2022 ?Eliquis 2.5 mg p.o. twice daily --started on 03/16/2021 ?    ?Interim History:  Victor [name] is in for follow-up.  Last time that we saw him, he clearly had a relapse of the AML.  He was sent over to [facility].  He was admitted.  Day treated him.  They started him on the new  FLT3 inhibitor.  He is now on gilteritinib at 40 mg a day. ? ?He is off his Eliquis. ? ?He is off the Vidaza and venetoclax. ? ?He is scheduled for a bone marrow test on 03/01/2022. ? ?He has had no problems with bleeding.  He does have petechia on his lower legs and dorsum of his feet.  His platelet count is 22,000.  We will go ahead and give him some platelets. ? ?His renal function is much better.  His magnesium is a little bit on the low side.  His magnesium is 1.6.  As such, we will give him some IV magnesium in the office. ? ?He has had no diarrhea.  He has had no obvious bleeding.  There is been no cough. ? ?While he was at [facility] he had a chest CT scan.  This showed some bronchial thickening.  He had some micronodular disease.  Nothing looked worse from the prior CT scan. ? ?He did have a ultrasound of his kidneys.  There is no hydronephrosis. ? ?As always, he gets incredible care at [facility] by Dr. Linus [name] and his staff. ? ?Currently, he has had no fever. ? ?Overall, I would say his performance status is ECOG 1.      ? ? ?Medications:  ?Current Outpatient Medications:  ?  cyanocobalamin 1000 MCG tablet, Take 1,000 mcg by mouth daily., Disp: , Rfl:  ?  Gilteritinib Fumarate 40 MG TABS, Take 120 mg by mouth daily., Disp: , Rfl:  ?  levofloxacin (LEVAQUIN) 500 MG  tablet, Take 250 mg by mouth daily., Disp: , Rfl:  ?  ondansetron (ZOFRAN) 8 MG tablet, Take 8 mg by mouth as needed., Disp: , Rfl:  ?  acyclovir (ZOVIRAX) 400 MG tablet, Take 400 mg by mouth 2 (two) times daily., Disp: , Rfl:  ?  AMBULATORY NON FORMULARY MEDICATION, Premier Protein Vanilla, Disp: 12 Bottle, Rfl: 2 ?  amoxicillin (AMOXIL) 500 MG tablet, Take 4 tablets (2058m) 1 hour prior to dental procedure., Disp: 4 tablet, Rfl: 0 ?  apixaban (ELIQUIS) 2.5 MG TABS tablet, Take 1 tablet (2.5 mg total) by mouth 2 (two) times daily. (Patient not taking: Reported on 01/01/2022), Disp: 60 tablet, Rfl: 6 ?  doxazosin (CARDURA) 2 MG tablet, Take 1 tablet (2 mg total) by mouth at bedtime., Disp: 90 tablet, Rfl: 3 ?  fluconazole (DIFLUCAN) 200 MG tablet, TAKE ONE TABLET BY MOUTH ONE TIME DAILY  *take while neutropenic*, Disp: 30 tablet, Rfl: 0 ?  gabapentin (NEURONTIN) 300 MG capsule, Take 2 capsules (600 mg total) by mouth 3 (three) times daily., Disp: 240 capsule, Rfl: 4 ?  ipratropium (ATROVENT) 0.06 % nasal spray, Place 1 spray into both nostrils 2 (two) times daily as needed for rhinitis., Disp: , Rfl:  ?  ketoconazole (NIZORAL) 2 %  shampoo, Apply 1 application topically 2 (two) times a week., Disp: , Rfl:  ?  levothyroxine (SYNTHROID) 150 MCG tablet, Take 1 tablet (150 mcg total) by mouth daily., Disp: 90 tablet, Rfl: 3 ?  Lidocaine 4 % PTCH, Apply 0.5 patches topically daily as needed (pain). Uses 1/2 patch daily., Disp: , Rfl:  ?  lidocaine-prilocaine (EMLA) cream, Apply 1 application topically as needed for up to 30 doses. (Patient taking differently: Apply 1 application topically daily as needed (port access).), Disp: 30 g, Rfl: 0 ?  loratadine (CLARITIN) 10 MG tablet, Take 10 mg by mouth daily as needed (allergies). Allertec, Disp: , Rfl:  ?  Melatonin 5 MG CHEW, Chew 5 mg by mouth at bedtime., Disp: , Rfl:  ?  pantoprazole (PROTONIX) 40 MG tablet, Take 40 mg by mouth 2 (two) times daily., Disp: , Rfl:  ?   Polyethyl Glycol-Propyl Glycol (DRY EYE RELIEF) 0.4-0.3 % GEL ophthalmic gel, 1 application 4 (four) times daily as needed (dry eyes)., Disp: , Rfl:  ?  potassium chloride (MICRO-K) 10 MEQ CR capsule, Take 10 mEq by mouth., Disp: , Rfl:  ?  propranolol ER (INDERAL LA) 80 MG 24 hr capsule, TAKE ONE CAPSULE BY MOUTH ONE TIME DAILY, Disp: 90 capsule, Rfl: 0 ?  simvastatin (ZOCOR) 40 MG tablet, TAKE ONE TABLET BY MOUTH DAILY AT BEDTIME (Patient taking differently: Take 40 mg by mouth daily at 6 PM.), Disp: 90 tablet, Rfl: 3 ? ?Current Facility-Administered Medications:  ?  magnesium sulfate IVPB 2 g 50 mL, 2 g, Intravenous, Once, Victor [name], Victor Cobb, MD ? ?Facility-Administered Medications Ordered in Other Visits:  ?  sodium chloride flush (NS) 0.9 % injection 10 mL, 10 mL, Intravenous, PRN, Volanda Napoleon, MD, 10 mL at 01/01/22 1120 ? ?Allergies:  ?Allergies  ?Allergen Reactions  ? Augmentin [Amoxicillin-Pot Clavulanate] Diarrhea  ?  SOB  ? ? ?Past Medical History, Surgical history, Social history, and Family History were reviewed and updated. ? ?Review of Systems: ?Review of Systems  ?Constitutional: Negative.   ?HENT:  Negative.    ?Eyes: Negative.   ?Respiratory: Negative.    ?Cardiovascular: Negative.   ?Gastrointestinal: Negative.   ?Endocrine: Negative.   ?Genitourinary: Negative.    ?Musculoskeletal: Negative.   ?Skin: Negative.   ?Neurological: Negative.   ?Hematological: Negative.   ?Psychiatric/Behavioral: Negative.    ? ?Physical Exam: ? height is 5' 10"  (1.778 m) and weight is 214 lb (97.1 kg). His oral temperature is 98.1 ?F (36.7 ?C). His blood pressure is 132/53 (abnormal) and his pulse is 73. His respiration is 16 and oxygen saturation is 95%.  ? ?Wt Readings from Last 3 Encounters:  ?02/19/22 214 lb (97.1 kg)  ?02/05/22 205 lb (93 kg)  ?01/18/22 214 lb (97.1 kg)  ? ? ?Physical Exam ?Vitals reviewed.  ?HENT:  ?   Head: Normocephalic and atraumatic.  ?Eyes:  ?   Pupils: Pupils are equal, round, and  reactive to light.  ?Cardiovascular:  ?   Rate and Rhythm: Normal rate and regular rhythm.  ?   Heart sounds: Normal heart sounds.  ?Pulmonary:  ?   Effort: Pulmonary effort is normal.  ?   Breath sounds: Normal breath sounds.  ?Abdominal:  ?   General: Bowel sounds are normal.  ?   Palpations: Abdomen is soft.  ?Musculoskeletal:     ?   General: No tenderness or deformity. Normal range of motion.  ?   Cervical back: Normal range of motion.  ?Lymphadenopathy:  ?  Cervical: No cervical adenopathy.  ?Skin: ?   General: Skin is warm and dry.  ?   Findings: No erythema or rash.  ?Neurological:  ?   Mental Status: He is alert and oriented to person, place, and time.  ?Psychiatric:     ?   Behavior: Behavior normal.     ?   Thought Content: Thought content normal.     ?   Judgment: Judgment normal.  ? ? ? ?Lab Results  ?Component Value Date  ? WBC 4.3 02/19/2022  ? HGB 9.3 (L) 02/19/2022  ? HCT 29.8 (L) 02/19/2022  ? MCV 91.7 02/19/2022  ? PLT 22 (L) 02/19/2022  ? ?  Chemistry   ?   ?Component Value Date/Time  ? NA 142 02/19/2022 1035  ? NA 140 11/22/2021 1117  ? NA 142 03/30/2015 1149  ? K 3.8 02/19/2022 1035  ? K 4.5 03/30/2015 1149  ? CL 105 02/19/2022 1035  ? CO2 29 02/19/2022 1035  ? CO2 25 03/30/2015 1149  ? BUN 9 02/19/2022 1035  ? BUN 8 11/22/2021 1117  ? BUN 17.1 03/30/2015 1149  ? CREATININE 1.17 02/19/2022 1035  ? CREATININE 1.2 03/30/2015 1149  ?    ?Component Value Date/Time  ? CALCIUM 6.7 (L) 02/19/2022 1035  ? CALCIUM 8.7 03/30/2015 1149  ? ALKPHOS 52 02/19/2022 1035  ? AST 22 02/19/2022 1035  ? ALT 15 02/19/2022 1035  ? BILITOT 0.4 02/19/2022 1035  ?  ? ? ? ?Impression and Plan: ?Mr. Mckiver is a very nice 86 year old white male who has acute myeloid leukemia.  He does have a adverse prognostic feature with respect to the FLT3 mutation.   ? ?Unfortunately, he clearly has relapsed.  He now is on gilterifinb.  Hopefully, this will get him back into remission. ? ?He will be interesting to see what the bone  marrow test shows when he has it.  I will be interested to see what the chromosome studies show. ? ?Again, we will go ahead and give him platelets today.  I will give him IV magnesium. ? ?He does look much better tha

## 2022-02-20 LAB — PREPARE PLATELET PHERESIS: Unit division: 0

## 2022-02-20 LAB — BPAM PLATELET PHERESIS
Blood Product Expiration Date: 202304192359
ISSUE DATE / TIME: 202304171226
Unit Type and Rh: 5100

## 2022-02-21 ENCOUNTER — Telehealth: Payer: Self-pay | Admitting: Family Medicine

## 2022-02-21 NOTE — Telephone Encounter (Signed)
Verbal orders given  

## 2022-02-21 NOTE — Telephone Encounter (Signed)
Victor [name] from [facility] is needing verbal orders for PT 1X/1W, 2X/4W and  1X/4W. Victor [name]'s [phone] ?

## 2022-02-22 ENCOUNTER — Other Ambulatory Visit: Payer: Self-pay

## 2022-02-22 ENCOUNTER — Inpatient Hospital Stay: Payer: Medicare Other

## 2022-02-22 VITALS — BP 104/48 | HR 73 | Temp 98.6°F | Resp 19

## 2022-02-22 DIAGNOSIS — C9502 Acute leukemia of unspecified cell type, in relapse: Secondary | ICD-10-CM

## 2022-02-22 DIAGNOSIS — Z95828 Presence of other vascular implants and grafts: Secondary | ICD-10-CM

## 2022-02-22 DIAGNOSIS — D696 Thrombocytopenia, unspecified: Secondary | ICD-10-CM

## 2022-02-22 DIAGNOSIS — C9200 Acute myeloblastic leukemia, not having achieved remission: Secondary | ICD-10-CM | POA: Diagnosis not present

## 2022-02-22 LAB — CBC WITH DIFFERENTIAL (CANCER CENTER ONLY)
Abs Immature Granulocytes: 0.03 10*3/uL (ref 0.00–0.07)
Basophils Absolute: 0 10*3/uL (ref 0.0–0.1)
Basophils Relative: 0 %
Eosinophils Absolute: 0 10*3/uL (ref 0.0–0.5)
Eosinophils Relative: 0 %
HCT: 29.3 % — ABNORMAL LOW (ref 39.0–52.0)
Hemoglobin: 9 g/dL — ABNORMAL LOW (ref 13.0–17.0)
Immature Granulocytes: 1 %
Lymphocytes Relative: 47 %
Lymphs Abs: 2.2 10*3/uL (ref 0.7–4.0)
MCH: 28.3 pg (ref 26.0–34.0)
MCHC: 30.7 g/dL (ref 30.0–36.0)
MCV: 92.1 fL (ref 80.0–100.0)
Monocytes Absolute: 2 10*3/uL — ABNORMAL HIGH (ref 0.1–1.0)
Monocytes Relative: 42 %
Neutro Abs: 0.5 10*3/uL — ABNORMAL LOW (ref 1.7–7.7)
Neutrophils Relative %: 10 %
Platelet Count: 34 10*3/uL — ABNORMAL LOW (ref 150–400)
RBC: 3.18 MIL/uL — ABNORMAL LOW (ref 4.22–5.81)
RDW: 19.4 % — ABNORMAL HIGH (ref 11.5–15.5)
WBC Count: 4.6 10*3/uL (ref 4.0–10.5)
nRBC: 0 % (ref 0.0–0.2)

## 2022-02-22 LAB — CMP (CANCER CENTER ONLY)
ALT: 13 U/L (ref 0–44)
AST: 20 U/L (ref 15–41)
Albumin: 3.1 g/dL — ABNORMAL LOW (ref 3.5–5.0)
Alkaline Phosphatase: 59 U/L (ref 38–126)
Anion gap: 10 (ref 5–15)
BUN: 14 mg/dL (ref 8–23)
CO2: 29 mmol/L (ref 22–32)
Calcium: 6.5 mg/dL — ABNORMAL LOW (ref 8.9–10.3)
Chloride: 100 mmol/L (ref 98–111)
Creatinine: 1.56 mg/dL — ABNORMAL HIGH (ref 0.61–1.24)
GFR, Estimated: 44 mL/min — ABNORMAL LOW (ref >60)
Glucose, Bld: 111 mg/dL — ABNORMAL HIGH (ref 70–99)
Potassium: 3.7 mmol/L (ref 3.5–5.1)
Sodium: 139 mmol/L (ref 135–145)
Total Bilirubin: 0.4 mg/dL (ref 0.3–1.2)
Total Protein: 5 g/dL — ABNORMAL LOW (ref 6.5–8.1)

## 2022-02-22 LAB — MAGNESIUM: Magnesium: 1.8 mg/dL (ref 1.7–2.4)

## 2022-02-22 LAB — SAMPLE TO BLOOD BANK

## 2022-02-22 MED ORDER — SODIUM CHLORIDE 0.9% FLUSH
10.0000 mL | Freq: Once | INTRAVENOUS | Status: AC
  Administered 2022-02-22: 10 mL via INTRAVENOUS

## 2022-02-22 MED ORDER — HEPARIN SOD (PORK) LOCK FLUSH 100 UNIT/ML IV SOLN
500.0000 [IU] | Freq: Once | INTRAVENOUS | Status: AC
  Administered 2022-02-22: 500 [IU] via INTRAVENOUS

## 2022-02-22 MED ORDER — SODIUM CHLORIDE 0.9 % IV SOLN: Freq: Once | INTRAVENOUS | Status: AC

## 2022-02-22 MED ORDER — SODIUM CHLORIDE 0.9 % IV SOLN
2.0000 g | Freq: Once | INTRAVENOUS | Status: AC
  Administered 2022-02-22: 2 g via INTRAVENOUS
  Filled 2022-02-22: qty 20

## 2022-02-22 NOTE — Patient Instructions (Signed)
[facility] [place]  Discharge Instructions: ?Thank you for choosing [facility] to provide your oncology and hematology care.  ? ?If you have a lab appointment with the [facility], please go directly to the [facility] and check in at the registration area. ? ?Wear comfortable clothing and clothing appropriate for easy access to any Portacath or PICC line.  ? ?We strive to give you quality time with your provider. You may need to reschedule your appointment if you arrive late (15 or more minutes).  Arriving late affects you and other patients whose appointments are after yours.  Also, if you miss three or more appointments without notifying the office, you may be dismissed from the clinic at the provider?s discretion.    ?  ?For prescription refill requests, have your pharmacy contact our office and allow 72 hours for refills to be completed.   ? ?Today you received the following chemotherapy and/or immunotherapy agents Calcium Gluconate.  ?  ?To help prevent nausea and vomiting after your treatment, we encourage you to take your nausea medication as directed. ? ?BELOW ARE SYMPTOMS THAT SHOULD BE REPORTED IMMEDIATELY: ?*FEVER GREATER THAN 100.4 F (38 ?C) OR HIGHER ?*CHILLS OR SWEATING ?*NAUSEA AND VOMITING THAT IS NOT CONTROLLED WITH YOUR NAUSEA MEDICATION ?*UNUSUAL SHORTNESS OF BREATH ?*UNUSUAL BRUISING OR BLEEDING ?*URINARY PROBLEMS (pain or burning when urinating, or frequent urination) ?*BOWEL PROBLEMS (unusual diarrhea, constipation, pain near the anus) ?TENDERNESS IN MOUTH AND THROAT WITH OR WITHOUT PRESENCE OF ULCERS (sore throat, sores in mouth, or a toothache) ?UNUSUAL RASH, SWELLING OR PAIN  ?UNUSUAL VAGINAL DISCHARGE OR ITCHING  ? ?Items with * indicate a potential emergency and should be followed up as soon as possible or go to the Emergency Department if any problems should occur. ? ?Please show the CHEMOTHERAPY ALERT CARD or IMMUNOTHERAPY ALERT CARD at check-in  to the Emergency Department and triage nurse. ?Should you have questions after your visit or need to cancel or reschedule your appointment, please contact [facility]  [phone] and follow the prompts.  Office hours are 8:00 a.m. to 4:30 p.m. Monday - Friday. Please note that voicemails left after 4:00 p.m. may not be returned until the following business day.  We are closed weekends and major holidays. You have access to a nurse at all times for urgent questions. Please call the main number to the clinic [phone] and follow the prompts. ? ?For any non-urgent questions, you may also contact your provider using MyChart. We now offer e-Visits for anyone 18 and older to request care online for non-urgent symptoms. For details visit [email or web address]. ?  ?Also download the MyChart app! Go to the app store, search "MyChart", open the app, select [facility], and log in with your MyChart username and password. ? ?Due to Covid, a mask is required upon entering the hospital/clinic. If you do not have a mask, one will be given to you upon arrival. For doctor visits, patients may have 1 support person aged 60 or older with them. For treatment visits, patients cannot have anyone with them due to current Covid guidelines and our immunocompromised population.  ?

## 2022-02-22 NOTE — Patient Instructions (Signed)

## 2022-02-23 ENCOUNTER — Telehealth: Payer: Self-pay | Admitting: *Deleted

## 2022-02-23 NOTE — Telephone Encounter (Signed)
Message received from patient's wife stating that pt is not able to get OOB and has a temperature of 102.3.  Dr. [name] notified.  Call placed back to [place] notified per order of Dr. [name] to call 911 and have pt taken to [facility].  Teach back done. Pt.'s wife is appreciative of call back and states that she will call 911 now. ?

## 2022-02-26 ENCOUNTER — Inpatient Hospital Stay: Payer: Medicare Other

## 2022-02-26 ENCOUNTER — Inpatient Hospital Stay: Payer: Medicare Other | Admitting: Hematology & Oncology

## 2022-03-01 ENCOUNTER — Inpatient Hospital Stay: Payer: Medicare Other

## 2022-03-05 ENCOUNTER — Inpatient Hospital Stay: Payer: Medicare Other

## 2022-03-05 ENCOUNTER — Telehealth: Payer: Self-pay | Admitting: *Deleted

## 2022-03-05 NOTE — Telephone Encounter (Signed)
Call received from patient's wife Aram Beecham to inform Dr. [name] that while pt was attempting to get up to come to his appt today, he fell to the floor.  Aram Beecham states that she had to have a neighbor help get pt back to bed and that he is now asleep.  Aram Beecham also states that she believes pt had a "small seizure" this morning. She states that pt.'s body was "twitching" and he had a "blank stare" on his face. Dr. [name] notified.  Trudy instructed to call 911 now and to take pt to [facility] per order of Dr. [name].  Aram Beecham states that she will call 911 now.   ?

## 2022-03-08 ENCOUNTER — Inpatient Hospital Stay: Payer: Medicare Other

## 2022-03-08 ENCOUNTER — Ambulatory Visit: Payer: Medicare Other | Admitting: Cardiology

## 2022-03-12 ENCOUNTER — Inpatient Hospital Stay: Payer: Medicare Other | Admitting: Hematology & Oncology

## 2022-03-12 ENCOUNTER — Inpatient Hospital Stay: Payer: Medicare Other

## 2022-03-14 ENCOUNTER — Inpatient Hospital Stay: Payer: Medicare Other | Admitting: Family Medicine

## 2022-03-15 ENCOUNTER — Ambulatory Visit: Payer: Medicare Other | Admitting: Family Medicine

## 2022-03-15 ENCOUNTER — Inpatient Hospital Stay: Payer: Medicare Other

## 2022-03-19 ENCOUNTER — Inpatient Hospital Stay: Payer: Medicare Other

## 2022-03-22 ENCOUNTER — Inpatient Hospital Stay: Payer: Medicare Other

## 2022-03-26 ENCOUNTER — Inpatient Hospital Stay: Payer: Medicare Other

## 2022-03-26 ENCOUNTER — Telehealth: Payer: Self-pay | Admitting: Cardiology

## 2022-03-26 NOTE — Telephone Encounter (Signed)
Patient's spouse says patient has passed away. Please advise.

## 2022-03-26 NOTE — Telephone Encounter (Signed)
Saw his admission records from 03/05/2022, unfortunately he has had recurrence of his cancer and overall his prognosis was poor.  I am truly sorry to hear about the loss, I will certainly keep him in my prayers.  Please deactivate the medical record.

## 2022-03-29 ENCOUNTER — Inpatient Hospital Stay: Payer: Medicare Other

## 2022-04-03 ENCOUNTER — Encounter: Payer: Self-pay | Admitting: Family Medicine

## 2022-04-05 DEATH — deceased

## 2023-01-11 ENCOUNTER — Encounter (INDEPENDENT_AMBULATORY_CARE_PROVIDER_SITE_OTHER): Payer: Medicare Other | Admitting: Ophthalmology
